# Patient Record
Sex: Male | Born: 1935 | Race: White | Hispanic: No | Marital: Married | Smoking: Never smoker
Health system: Southern US, Community
[De-identification: ages and names within clinical notes are randomized; demographics above are authoritative.]

## PROBLEM LIST (undated history)

## (undated) DIAGNOSIS — R569 Unspecified convulsions: Secondary | ICD-10-CM

## (undated) DIAGNOSIS — C4491 Basal cell carcinoma of skin, unspecified: Secondary | ICD-10-CM

## (undated) DIAGNOSIS — D649 Anemia, unspecified: Secondary | ICD-10-CM

## (undated) DIAGNOSIS — G4733 Obstructive sleep apnea (adult) (pediatric): Secondary | ICD-10-CM

## (undated) DIAGNOSIS — Z923 Personal history of irradiation: Secondary | ICD-10-CM

## (undated) DIAGNOSIS — K219 Gastro-esophageal reflux disease without esophagitis: Secondary | ICD-10-CM

## (undated) DIAGNOSIS — C61 Malignant neoplasm of prostate: Secondary | ICD-10-CM

## (undated) DIAGNOSIS — D496 Neoplasm of unspecified behavior of brain: Secondary | ICD-10-CM

## (undated) DIAGNOSIS — R351 Nocturia: Secondary | ICD-10-CM

## (undated) DIAGNOSIS — R195 Other fecal abnormalities: Secondary | ICD-10-CM

## (undated) DIAGNOSIS — K449 Diaphragmatic hernia without obstruction or gangrene: Secondary | ICD-10-CM

## (undated) DIAGNOSIS — E785 Hyperlipidemia, unspecified: Secondary | ICD-10-CM

## (undated) DIAGNOSIS — I10 Essential (primary) hypertension: Secondary | ICD-10-CM

## (undated) DIAGNOSIS — G2581 Restless legs syndrome: Secondary | ICD-10-CM

## (undated) DIAGNOSIS — I351 Nonrheumatic aortic (valve) insufficiency: Secondary | ICD-10-CM

## (undated) DIAGNOSIS — D329 Benign neoplasm of meninges, unspecified: Secondary | ICD-10-CM

## (undated) DIAGNOSIS — IMO0002 Reserved for concepts with insufficient information to code with codable children: Secondary | ICD-10-CM

## (undated) HISTORY — PX: OTHER SURGICAL HISTORY: SHX169

## (undated) HISTORY — PX: TONSILLECTOMY: SUR1361

## (undated) HISTORY — PX: COLONOSCOPY: SHX174

## (undated) HISTORY — PX: PROSTATE BIOPSY: SHX241

## (undated) HISTORY — DX: Nonrheumatic aortic (valve) insufficiency: I35.1

## (undated) HISTORY — DX: Obstructive sleep apnea (adult) (pediatric): G47.33

## (undated) HISTORY — PX: VASECTOMY: SHX75

## (undated) HISTORY — PX: JOINT REPLACEMENT: SHX530

---

## 2001-04-04 ENCOUNTER — Ambulatory Visit (HOSPITAL_COMMUNITY): Admission: RE | Admit: 2001-04-04 | Discharge: 2001-04-04 | Payer: Self-pay | Admitting: Gastroenterology

## 2005-03-25 ENCOUNTER — Observation Stay (HOSPITAL_COMMUNITY): Admission: EM | Admit: 2005-03-25 | Discharge: 2005-03-26 | Payer: Self-pay | Admitting: Emergency Medicine

## 2005-03-31 ENCOUNTER — Ambulatory Visit (HOSPITAL_COMMUNITY): Admission: RE | Admit: 2005-03-31 | Discharge: 2005-03-31 | Payer: Self-pay | Admitting: Gastroenterology

## 2008-04-11 ENCOUNTER — Observation Stay (HOSPITAL_COMMUNITY): Admission: EM | Admit: 2008-04-11 | Discharge: 2008-04-12 | Payer: Self-pay | Admitting: Emergency Medicine

## 2008-04-21 ENCOUNTER — Encounter: Admission: RE | Admit: 2008-04-21 | Discharge: 2008-04-21 | Payer: Self-pay | Admitting: Gastroenterology

## 2008-09-30 ENCOUNTER — Inpatient Hospital Stay (HOSPITAL_COMMUNITY): Admission: RE | Admit: 2008-09-30 | Discharge: 2008-10-03 | Payer: Self-pay | Admitting: Orthopedic Surgery

## 2008-10-28 ENCOUNTER — Encounter: Admission: RE | Admit: 2008-10-28 | Discharge: 2009-01-26 | Payer: Self-pay | Admitting: Orthopedic Surgery

## 2009-01-19 ENCOUNTER — Ambulatory Visit (HOSPITAL_COMMUNITY): Admission: RE | Admit: 2009-01-19 | Discharge: 2009-01-19 | Payer: Self-pay | Admitting: Urology

## 2009-01-21 ENCOUNTER — Ambulatory Visit: Admission: RE | Admit: 2009-01-21 | Discharge: 2009-04-21 | Payer: Self-pay | Admitting: Radiation Oncology

## 2009-04-06 ENCOUNTER — Encounter: Admission: RE | Admit: 2009-04-06 | Discharge: 2009-04-06 | Payer: Self-pay | Admitting: Family Medicine

## 2009-04-21 ENCOUNTER — Ambulatory Visit: Admission: RE | Admit: 2009-04-21 | Discharge: 2009-05-10 | Payer: Self-pay | Admitting: Radiation Oncology

## 2011-01-03 LAB — BASIC METABOLIC PANEL
BUN: 19 mg/dL (ref 6–23)
BUN: 22 mg/dL (ref 6–23)
CO2: 26 mEq/L (ref 19–32)
Calcium: 7.9 mg/dL — ABNORMAL LOW (ref 8.4–10.5)
Calcium: 8.1 mg/dL — ABNORMAL LOW (ref 8.4–10.5)
Chloride: 101 mEq/L (ref 96–112)
Chloride: 102 mEq/L (ref 96–112)
Creatinine, Ser: 1.04 mg/dL (ref 0.4–1.5)
Creatinine, Ser: 1.1 mg/dL (ref 0.4–1.5)
GFR calc Af Amer: >60 mL/min (ref >60)
GFR calc Af Amer: >60 mL/min (ref >60)
GFR calc non Af Amer: >60 mL/min (ref >60)
Glucose, Bld: 82 mg/dL (ref 70–99)
Potassium: 4 mEq/L (ref 3.5–5.1)
Sodium: 134 mEq/L — ABNORMAL LOW (ref 135–145)

## 2011-01-03 LAB — CBC
HCT: 45.8 % (ref 39.0–52.0)
Hemoglobin: 10.9 g/dL — ABNORMAL LOW (ref 13.0–17.0)
Hemoglobin: 15.2 g/dL (ref 13.0–17.0)
MCHC: 33.2 g/dL (ref 30.0–36.0)
MCV: 97.6 fL (ref 78.0–100.0)
MCV: 98.3 fL (ref 78.0–100.0)
Platelets: 182 10*3/uL (ref 150–400)
Platelets: ADEQUATE 10*3/uL (ref 150–400)
RBC: 3.34 MIL/uL — ABNORMAL LOW (ref 4.22–5.81)
RBC: 3.54 MIL/uL — ABNORMAL LOW (ref 4.22–5.81)
WBC: 10.4 10*3/uL (ref 4.0–10.5)
WBC: 13.6 10*3/uL — ABNORMAL HIGH (ref 4.0–10.5)
WBC: 8 10*3/uL (ref 4.0–10.5)

## 2011-01-03 LAB — URINALYSIS, ROUTINE W REFLEX MICROSCOPIC
Glucose, UA: NEGATIVE mg/dL
Hgb urine dipstick: NEGATIVE
Protein, ur: NEGATIVE mg/dL
Specific Gravity, Urine: 1.017 (ref 1.005–1.030)
pH: 7 (ref 5.0–8.0)

## 2011-01-03 LAB — DIFFERENTIAL
Eosinophils Relative: 2 % (ref 0–5)
Lymphs Abs: 1.6 10*3/uL (ref 0.7–4.0)
Monocytes Absolute: 0.9 10*3/uL (ref 0.1–1.0)
Neutro Abs: 5.3 10*3/uL (ref 1.7–7.7)

## 2011-01-03 LAB — TYPE AND SCREEN
ABO/RH(D): O POS
Antibody Screen: NEGATIVE

## 2011-01-03 LAB — PROTIME-INR: Prothrombin Time: 12.7 seconds (ref 11.6–15.2)

## 2011-01-03 LAB — ABO/RH: ABO/RH(D): O POS

## 2011-02-01 NOTE — Consult Note (Signed)
NAME:  [name], [name] NO.:  [id number]   MEDICAL RECORD NO.:  [id number]          PATIENT TYPE:  INP   LOCATION:  5531                         FACILITY:  [facility]   PHYSICIAN:  Graylin Shiver, M.D.   DATE OF BIRTH:  04-Feb-1931   DATE OF CONSULTATION:  04/11/2008  DATE OF DISCHARGE:                                 CONSULTATION   REASON FOR CONSULTATION:  The patient is a 77 year old white male who  was admitted to the hospital yesterday because of anemia.  He presented  to his primary care physician with complaints of worsening restless leg  syndrome and some blood work was done which showed that he was anemic.  He was admitted to the hospital with hemoglobin of 8.3 and hematocrit of  27.1.  He was transfused 2 units of blood and today his hemoglobin and  hematocrit are 9.7 and 30 respectively.  The patient has not noticed any  evidence of gastrointestinal bleeding.  He denies hematemesis, melena,  or hematochezia.  He has no abdominal pain.   In 2006, he had a similar episode of anemia reported as heme positive.  He had a colonoscopy which was normal and an EGD which showed a hiatal  hernia and duodenitis.   He had an EGD and a colonoscopy also back in 2002, done by Dr. Ewing Schlein.  It was reported that he had some external and internal hemorrhoids and  some left-sided diverticula.   ALLERGIES:  None known.   MEDICAL HISTORY:  1. Hyperlipidemia.  2. Restless leg syndrome.  3. States he had a peptic ulcer when he was in his 76s.  4. Hyperlipidemia.  5. Actinic keratosis.  6. Diverticulosis.  7. Arthralgias.  8. BPH.   MEDICATIONS PRIOR TO ADMISSION:  Diovan/HCT, Zocor, Prilosec, ReQuip,  and multivitamin.   SOCIAL HISTORY:  Does not smoke or drink alcohol.   Systems reviewed, not complaining of chest pain or shortness of breath.   PHYSICAL EXAMINATION:  VITAL SIGNS:  Stable.  GENERAL:  He is in no distress, nonicteric.  HEART:  Regular rhythm.  No  murmurs.  LUNGS:  Clear.  ABDOMEN:  Soft, nontender, no hepatosplenomegaly.   Serum iron percent saturation is 2%.   IMPRESSION:  Iron deficiency anemia.   COMMENTS:  This patient has not exhibited any signs clinically of any  bleeding.  He is currently stable.  I think that he can be discharged to  home and follow up with Dr. Ewing Schlein as an outpatient for consideration of  repeat outpatient EGD, colonoscopy, and capsule endoscopy.           ______________________________  Graylin Shiver, M.D.     SFG/MEDQ  D:  04/11/2008  T:  04/12/2008  Job:  [id number]   cc:   Petra Kuba, M.D.  [name], M.D.

## 2011-02-01 NOTE — Discharge Summary (Signed)
[name], [name] NO.:  [id number]   MEDICAL RECORD NO.:  [id number]          PATIENT TYPE:  INP   LOCATION:  1616                         FACILITY:  [facility]   PHYSICIAN:  Madlyn Frankel. Charlann Boxer, M.D.  DATE OF BIRTH:  04-Feb-1931   DATE OF ADMISSION:  09/30/2008  DATE OF DISCHARGE:  10/03/2008                               DISCHARGE SUMMARY   ADMITTING DIAGNOSES:  1. Osteoarthritis.  2. Hypercholesterolemia.  3. Hiatal hernia.  4. Anemia.  5. Skin cancer.   DISCHARGE DIAGNOSES:  1. Osteoarthritis.  2. Hypercholesterolemia.  3. Hiatal hernia.  4. Anemia.  5. Skin cancer.   HISTORY OF PRESENT ILLNESS:  A 77 year old male with a history of right  knee pain secondary to osteoarthritis.   CONSULTATIONS:  None.   PROCEDURE:  A right total knee replacement by surgeon Dr. Durene Romans.  Assistant Dwyane Luo, PA-C.   LABORATORY DATA:  CBC final reading - white blood cells 13.6, hemoglobin  10.9, hematocrit 32.8, platelets 154.  White cell differential all  within normal limits.  Coags normal.  Metabolic panel final reading -  sodium 134, potassium 4, glucose 124, BUN 21, creatinine 1.1.  Calcium  was 7.9.  Urinalysis was negative.   ELECTROCARDIOGRAM:  Normal sinus rhythm.   RADIOLOGY:  No chest x-ray found.   HOSPITAL COURSE:  The patient underwent a right total knee replacement  and was admitted to the orthopedic floor.  His stay was unremarkable.  He remained hemodynamically stable throughout his course of stay.  He  made good progress with physical therapy, able to ambulate at least 75  feet prior to discharge with the use of a rolling walker and improving  quad function.  His dressing was changed.  No significant drainage from  the wound.  A dry dressing was applied and is to be changed on a daily  basis.  Seen on October 03, 2008, was stable and ready for discharge  home.   DISCHARGE DISPOSITION:  Discharged home in stable and improved  condition.   Weightbearing as tolerated.   DISCHARGE PHYSICAL THERAPY:  Weightbearing as tolerated with the use of  a rolling walker.  Goals for range of motion of 0-120 degrees at 2  weeks.   DISCHARGE DIET:  Heart healthy.   DISCHARGE WOUND CARE:  Keep dry.   DISCHARGE MEDICATIONS:  1. Lovenox 40 mg subcutaneous q.24h. x10 days.  2. Then start enteric-coated aspirin one p.o. daily x4 weeks.  3. Robaxin 500 mg p.o. q.6h.  4. Colace 100 mg p.o. b.i.d.  5. MiraLax 17 gm p.o. daily.  6. Celebrex 200 mg p.o. b.i.d. x2 weeks.  7. Oxycodone 5 mg 1-3 p.o. q.3-4h. p.r.n. pain.  8. Tylenol 1000 mg q.6h.; do not exceed 4000 mg per day.  9. Simvastatin 40 mg one p.o. q.p.m.  10.Diovan HCTZ 160/12.5 one p.o. q.a.m.  11.Ropinirole 10 mg one p.o. q.p.m.  12.Prilosec one p.o. q.p.m.  13.Iron 325 mg p.o. daily.  14.Multivitamin daily.   DISCHARGE FOLLOW UP:  Follow up with Dr. Charlann Boxer at phone number [phone]  in  2 weeks for a wound check.     ______________________________  Yetta Glassman. [address], [place]      Madlyn Frankel. Charlann Boxer, M.D.  Electronically Signed    BLM/MEDQ  D:  10/28/2008  T:  10/28/2008  Job:  [id number]   cc:   [name], M.D.  Fax: [phone]   Petra Kuba, M.D.  Fax: [phone]   Hope M. Danella Deis, M.D.  Fax: [phone]   Chucky May, M.D.  Fax: [phone]

## 2011-02-01 NOTE — H&P (Signed)
[name], BABE NO.:  [id number]   MEDICAL RECORD NO.:  [id number]          PATIENT TYPE:  INP   LOCATION:  NA                           FACILITY:  [facility]   PHYSICIAN:  Madlyn Frankel. Charlann Boxer, M.D.  DATE OF BIRTH:  1931/04/24   DATE OF ADMISSION:  09/30/2008  DATE OF DISCHARGE:                              HISTORY & PHYSICAL   PROCEDURE:  Right total knee replacement protocol.   CHIEF COMPLAINT:  Right knee pain.   HISTORY OF PRESENT ILLNESS:  A 77 year old male with a history of right  knee pain secondary to osteoarthritis.  It has been refractory to all  conservative treatment.   PAST MEDICAL HISTORY:  1. Osteoarthritis.  2. Hypercholesterolemia.  3. Hiatal hernia.  4. Anemia.  5. Skin cancer.   PAST SURGICAL HISTORY:  None.   FAMILY HISTORY:  Alzheimer's, congestive heart failure.   SOCIAL HISTORY:  Married.  Nonsmoker.  He does have a primary caregiver  postoperatively in the home.   DRUG ALLERGIES:  No known drug allergies.   MEDICATIONS:  1. Simvastatin 40 mg 1 p.o. daily.  2. Diovan/HCTZ 160/unknown 1 p.o. daily.  3. Requip 2 mg p.o. daily.  4. Prilosec 40 mg 1 p.o. daily.  5. Multivitamin p.o. daily.  6. Iron 325 mg 1 p.o. b.i.d.  7. Celebrex 200 mg 1 p.o. b.i.d. x2 weeks after surgery.   REVIEW OF SYSTEMS:  GENERAL:  He has chills.  HEENT:  He has ringing in  his ears.  GENITOURINARY:  He has increased urination at night.  MUSCULOSKELETAL:  Joint pain.  Otherwise see HPI.   PHYSICAL EXAMINATION:  Pulse 72, respirations 16, blood pressure 128/78.  GENERAL:  Awake, alert and oriented.  Well-developed and well-nourished  in no acute distress.  HEENT:  Normocephalic.  NECK:  Supple.  No carotid bruits.  CHEST:  Lungs are clear to auscultation bilaterally.  BREASTS:  Deferred.  HEART:  Regular rate and rhythm.  S1 and S2 distinct.  ABDOMEN:  Soft, nontender, nondistended.  Bowel sounds present.  PELVIS:  Stable.  GENITOURINARY:   Deferred.  EXTREMITIES:  Right knee pain.  SKIN:  No cellulitis.  NEUROLOGIC:  Intact distal sensibilities.   LABORATORY DATA:  Labs, EKG, chest x-ray all pending pre-surgical  testing.   IMPRESSION:  Right knee osteoarthritis.   PLAN OF ACTION:  Right total knee replacement protocol at [facility] on September 30, 2008 by Dr. Lajoyce Corners.  Risks and complications  were discussed.   Postoperative medications will be provided, including aspirin, Robaxin,  Celebrex, Ambien, Colace, MiraLax.     ______________________________  Yetta Glassman. [address], [place]      Madlyn Frankel. Charlann Boxer, M.D.  Electronically Signed    BLM/MEDQ  D:  09/22/2008  T:  09/22/2008  Job:  [id number]   cc:   [name], M.D.  Fax: [phone]   Petra Kuba, M.D.  Fax: [phone]   Hope M. Danella Deis, M.D.  Fax: [phone]   Chucky May, M.D.  Fax: [phone]

## 2011-02-01 NOTE — H&P (Signed)
[name], BURMESTER NO.:  [id number]   MEDICAL RECORD NO.:  [id number]          PATIENT TYPE:  INP   LOCATION:  5531                         FACILITY:  [facility]   PHYSICIAN:  Ramiro Harvest, MD    DATE OF BIRTH:  02/21/1931   DATE OF ADMISSION:  04/10/2008  DATE OF DISCHARGE:                              HISTORY & PHYSICAL   ATTENDING PHYSICIAN:  Ramiro Harvest, M.D.   PRIMARY CARE PHYSICIAN:  The patient's primary care physician is [name], M.D. of [facility].   GASTROENTEROLOGIST:  The patient's gastroenterologist is Petra Kuba,  M.D. of [facility].   HISTORY OF THE PRESENT ILLNESS:  The patient is a 77 year old white male  with a history of hypertension, anemia, gastritis, hiatal hernia,  internal and external hemorrhoids, and diverticulosis who presents from  his PCP's office with anemia.  The patient states he went to see his PCP  concerning worsening restless leg syndrome; and, his PCP did some blood  work and sent the patient to the ED for further evaluation and  recommendations.   The patient denies any malaise, has no fatigue, no weakness, no chest  pain, no shortness of breath, no hematemesis, no melena, no  hematochezia, no abdominal pain, no use of NSAIDs no nausea, no  vomiting, no fever, no cough, no headaches, no dizziness, no syncope, no  focal neurological symptoms, no diarrhea, no constipation, and no other  associated symptoms or urinary symptoms.  The patient is not on any  NSAIDS and uses Tylenol for his pain.  The patient states that he had  prior episodes in the past.  The patient was in the ED and a repeat CBC  showed a white count of 7.9, hemoglobin 8.3 and platelets 337,000.  UA  was planned.  The  EKG showed normal sinus rhythm.  I-STAT was 8.  Sodium 139, potassium  3.6, chloride 104, BUN 25, and creatinine 1.01.   ALLERGIES:  No known drug allergies.   PAST MEDICAL HISTORY:  1. Hyperlipidemia.  2.  Restless leg syndrome.  3. Anemia.  4. Peptic ulcer disease diagnosed when the patient was in his 39s.  5. Hyperlipidemia.  6. Actinic keratosis.  7. Hiatal hernia per EGD of April 04, 2006.  8. History of gastritis.  9. Internal and external hemorrhoids diagnosed on April 04, 2001 on      endoscopy.  10.Diverticular on the left side diagnosed on April 04, 2001 per      colonoscopy per Dr. Ewing Schlein.  11.Duodenitis/antral gastritis diagnosed in July 2006.  12.Gastroesophageal reflux disease.  13.Erectile dysfunction.  14.Basal cell cancer; followed up by Dr. Danella Deis.  15.Arthralgias.  16.BPH.   MEDICATIONS:  The patient's home medications are:  1. Diovan HCT 160/12.5 mg a day.  2. Zocor 40 mg daily.  3. Prilosec 20 mg daily.  4. Requip 2 mg at bedtime.  5. Multivitamin daily.   SOCIAL HISTORY:  No tobacco.  No alcohol.  No IV drug use.  The patient  is married and lives with his wife in [place], and  has two sons that  are healthy.   FAMILY HISTORY:  Mother is alive at age 6 with Parkinson's disease and  rheumatoid arthritis.  She is bedridden at the [facility].  Father is  deceased at age 5 from Alzheimer's disease and also coronary artery  disease.  A grandfather with a history of colon cancer.  A brother is  alive at age 42 with coronary artery disease.   REVIEW OF SYSTEMS:  The review of systems is as per the HPI.  It is also  positive for chills, which are more chronic in nature per the patient.   PHYSICAL EXAMINATION:  VITAL SIGNS:  On physical exam temperature 100.0,  blood pressure 126/66, pulse 83, respiratory rate 18, and satting 90% on  room air.  GENERAL APPEARANCE:  In general the patient is in no apparent distress.  HEENT:  The head is normocephalic and atraumatic.  Pupils are equal,  round and react to light.  Extraocular movements are intact.  Oropharynx  is clear with no lesions and no exudates.  NECK:  The neck is supple.  No lymphadenopathy.  CHEST AND  LUNGS:  Respiratory shows the lungs are clear to auscultation  bilaterally in the anterior lung fields.  HEART:  Cardiovascular reveals regular rate and rhythm.  No murmurs,  rubs or gallops.  ABDOMEN:  The abdomen is soft, nontender and nondistended with positive  bowel sounds.  EXTREMITIES:  The extremities reveal no clubbing, cyanosis or edema.  NEUROLOGICAL:  The patient is alert and oriented x3.  Cranial nerves II-  XII are grossly intact.  No focal deficits.  Gait is not tested.   LABORATORY DATA:  I-STAT; sodium 139, potassium 3.6, chloride 104, BUN  25, creatinine 1.1, and glucose 114. EKG showed normal sinus rhythm.  CBC; white count 7.5, hemoglobin 8.3, platelets to 337,000, and  hematocrit 27.1.  UA was bland .  The patient had a hemoglobin of 7.9 in  his PCP's office.   ASSESSMENT AND PLAN:  The patient is a 77 year old gentleman with a  history of diverticulitis, gastritis, hiatal hernia and duodenitis as  well as a past history of anemia and peptic ulcer disease when he was in  his 4s who presents to the emergency department with anemia from his  primary care physician's office; and a repeat hemoglobin of 8.3.   Problem 1  Anemia of questionable etiology, likely lower  gastrointestinal bleed versus upper gastrointestinal bleed.  The patient  also has a history of hemorrhoids, gastritis, duodenitis, and peptic  ulcer disease in his 63s. We will place 2 large bore intravenous lines,  give him a clear liquid diet and intravenous fluids.  We will check a  CMET, check an anemia panel, check coagulations, check a complete blood  count every 8 hours x3 and guaiac stools.  We will check a chest x-ray.  We will transfuse 2 units of packed red blood cells with 20 of Lasix in  between.  We will give him Protonix 80 mg IV every 12 hours.  We will  consult GI in the morning.  Problem  2  Restless leg syndrome.  We will continue his home dose of  Requip.  Problem  3   Hyperlipidemia.  Zocor.  Problem  4  Hypertension.  We will hold his blood pressure medications.  Problem  5  Actinic keratosis.  Problem  6  Hiatal hernia.  Problem  7  Gastroesophageal reflux disease.  We will give Protonix.  Problem  8  History of basal cell cancer.  Problem  9  Benign prostatic hypertrophy.  Problem  10  Arthralgias.  Problem  11  Prophylaxes.  Protonix for gastrointestinal prophylaxis.  Sequential compressive devices for deep venous thrombosis prophylaxis.   It has been a pleasure taking care of this patient.      Ramiro Harvest, MD  Electronically Signed     DT/MEDQ  D:  04/11/2008  T:  04/11/2008  Job:  [id number]   cc:   [name], M.D.  Fax: [phone]   Petra Kuba, M.D.  Fax: [phone]

## 2011-02-01 NOTE — Op Note (Signed)
[name], [name] NO.:  [id number]   MEDICAL RECORD NO.:  [id number]          PATIENT TYPE:  INP   LOCATION:  0005                         FACILITY:  [facility]   PHYSICIAN:  Madlyn Frankel. Charlann Boxer, M.D.  DATE OF BIRTH:  [birth year]   DATE OF PROCEDURE:  09/30/2008  DATE OF DISCHARGE:                               OPERATIVE REPORT   PREOPERATIVE DIAGNOSIS:  Right knee osteoarthritis.   POSTOPERATIVE DIAGNOSIS:  Right knee osteoarthritis.   PROCEDURE:  Right total knee replacement.   COMPONENTS:  DePuy rotating platform posterior stabilized knee system,  size 4 narrow femur, 4 tibia, 10-mm insert, and 38 patella button.   ASSISTANT:  Yetta Glassman. Marcellus Scott.   ANESTHESIA:  Duramorph spinal.   SPECIMEN:  None.   FINDINGS:  None.   COMPLICATIONS:  None.   DRAINS:  One medium Hemovac.   TOURNIQUET TIME:  40 minutes at 250 mmHg.   INDICATIONS FOR PROCEDURE:  Mr. Marcil is a 77 year old gentleman who I  have been following for bilateral knee pain.  He had radiographic  evidence of degenerative changes.  He had failed conservative measures  and at this point wished to proceed with more definitive treatment in  terms of knee replacement.  I reviewed the risks and benefits of this  procedure including infection, DVT, component failure, and need for  revision, including manipulation.   Consent was obtained for the benefit of pain relief.   PROCEDURE IN DETAIL:  The patient was brought to the operative theater.  Once adequate anesthesia and preoperative antibiotics, Ancef,  administered, the patient was positioned supine.  A thigh tourniquet was  placed on the right thigh.  A time-out was performed, identifying the  patient and extremity.  The right lower extremity was then pre-scrubbed,  prepped, and draped in sterile fashion.  The leg was exsanguinated and  tourniquet elevated to 250 mmHg.  A midline incision was made followed  by a median arthrotomy.  Following  initial debridement, attention was  directed to the patella.  Precut measurement was 30 mm.  I resected down  to 14 mm and used a 38 patellar button which covered  this cut surface  great.   I placed a metal shim on the cut surface to protect it from retractors.   The attention was now directed to the femur.   The femoral canal was opened with the drill and irrigated to prevent fat  emboli.  I placed an intramedullary rod and at 3 degrees of valgus, I  resected 10 mm of bone off the distal femur.   The tibia was then subluxated anteriorly.  I used an extramedullary  guide and resected 10 mm of bone off the proximal lateral tibia.  I  checked this cut to make sure it was perpendicular with the tray and  alignment rod, and once it was confirmed that this was perpendicular, I  returned back to the femur.   Femoral rotation was based off the proximal cut of the tibia.  I sized  the femur to be a size 4, and an  anterior-to-posterior dimension that I  felt was going to be best with the narrow base on the medial-to-lateral  dimension.  I then used the size 4 rotation block based off the cut  surface of the proximal tibia.  It was pinned into place.  We exchanged  this out then for the 4-in-1 cutting block.  The anterior, posterior,  and chamfer cuts were then made without difficulty or notching.   At this point, the final box cut was made on the lateral aspect of  distal femur.   The tibia was then subluxated anteriorly.  I chose to use a size 4 which  fit best on the cut surface of the tibia.  It was pinned to the medial  third of the tubercle.  It was then drilled and keel punched and now a  trial reduction carried out with 4 narrow femur, 4 tibia, and 10-mm  insert.  The knee came to full extension with stable ligaments from  extension to flexion.  The patella tracked through the trochlea without  application of pressure.  Given this, all trial components were removed.  We injected  the synovial capsule junction with 60 mL of 0.25% Marcaine  with epinephrine.  I then irrigated the knee with normal saline solution  pulse lavage.   The final components were cemented into place.  The knee was brought to  extension with a 10-mm insert.  The extruded cement was removed.   Once the cement had cured, excessive cement was removed throughout the  knee.   Once we were satisfied there was no remaining cement, the final 10-mm  insert to match the 4 femur was inserted.   A medium Hemovac drain was placed in the deep subcapsular tissues.  We  irrigated the knee again with pulse lavage.   The knee was then brought to flexion.  The extensor mechanism was  reapproximated using 1-Vicryl.  The remaining wound was closed in layers  with 2-0 Vicryl and running 4-0 Monocryl.  The knee was cleaned, dried,  and dressed sterilely with Steri-Strips and a bulky sterile wrap.  He  was then brought to the recovery room in stable condition.      Madlyn Frankel Charlann Boxer, M.D.  Electronically Signed     MDO/MEDQ  D:  09/30/2008  T:  09/30/2008  Job:  [id number]

## 2011-02-04 NOTE — Procedures (Signed)
[facility]  Patient:    [name], [name]                         MRN: [id number] Proc. Date: 04/04/01 Adm. Date:  [id number] Attending:  Nelda Marseille CC:         Dellis Anes. Idell Pickles, M.D.   Procedure Report  PROCEDURE:  Esophagogastroduodenoscopy with biopsy.  SURGEON:  Petra Kuba, M.D.  INDICATIONS:  Patient with guaiac positivity and nondiagnostic colonoscopy. Consent was signed after risks, benefits, methods, and options were thoroughly discussed in the office.  ADDITIONAL MEDICINES FOR THIS PROCEDURE:  Demerol 25 and Versed 2.5.  DESCRIPTION OF PROCEDURE:  The video endoscope was inserted by direct vision. The esophagus was tortuous, but normal.  In the distal esophagus, there was a large hiatal hernia.  The scope passed easily through this into the stomach and pertinent for some gastritis and antritis through a normal pylorus into the duodenal bulb, pertinent for a moderate amount of bulbitis, but no active ulcers around to the cilia to a normal second portion of the duodenum.  The scope was withdrawn back to the bulb and a good look there confirmed a mild to moderate inflammation.  The scope was withdrawn back to the stomach and retroflexed.  High in the cardia, the hiatal hernia was confirmed.  The stomach was evaluated on retroflex and straight visualization.  The angularis, cardia, and fundus, lesser and greater curve were pertinent for some distal gastritis as well as the antritis, but no additional findings were seen.  We advanced to the antrum and one biopsy for the CLOtest was obtained to rule out Helicobacter.  Straight visualization of the stomach did not reveal any additional findings.  Air was suctioned and the scope slowly withdrawn. Again, a good look at the esophagus was normal on slow withdrawal.  The scope was removed.  The patient tolerated the procedure well.  There were no obvious or immediate  complications.  ENDOSCOPIC DIAGNOSES: 1. Large hiatal hernia. 2. Moderate gastritis, antritis, and bulbitis, status post Campylobacter-like    organism biopsy of the antrum to rule out Helicobacter. 3. Otherwise normal esophagogastroduodenoscopy.  PLAN:  Await CLO biopsy, probably treat at some point in the future care with aspirin and nonsteroidals.  Followup p.r.n. or in 6-8 weeks to recheck symptoms and probably guaiacs.  Also of aspirin and nonsteroidals possibly while treating with a pump inhibitor, possibly then recheck CBC to make sure no further workup plans are needed. DD:  04/04/01 TD:  04/05/01 Job: [id number]

## 2011-02-04 NOTE — H&P (Signed)
NAMEHADDON, FYFE NO.:  [id number]   MEDICAL RECORD NO.:  [id number]          PATIENT TYPE:  EMS   LOCATION:  ED                           FACILITY:  [facility]   PHYSICIAN:  Melissa L. Ladona Ridgel, MD  DATE OF BIRTH:  1930/10/08   DATE OF ADMISSION:  03/25/2005  DATE OF DISCHARGE:                                HISTORY & PHYSICAL   CHIEF COMPLAINT:  Weakness with the finding of severe anemia on outpatient  labs.   PRIMARY CARE PHYSICIAN:  Dr. Janey Greaser.  Patient was sent to the emergency  room by Dr. Hyacinth Meeker.   HISTORY OF PRESENT ILLNESS:  Patient is a 77 year old white male with last  visit to Dr. Janey Greaser over approximately one year ago.  Patient states that  over the last 2-3 weeks, he has noticed a gradual onset of shortness of  breath with exertional dyspnea and severe fatigue.  He gives example, for  instance, he used to walk four miles at least twice a week.  He attempts to  go out and do that and can only walk for a few minutes.  His wife noted in  the last couple of days that he would take a chair out to do some yard work  and have to sit down after a couple of minutes.  He denies any PND or  orthopnea.  He states that he has had no melena or hematochezia.  He has had  no nausea or vomiting.  He relates a new onset of restless legs syndrome,  and he was started on some medication this week for it.   REVIEW OF SYSTEMS:  Restless legs.  No melena or hematochezia.  On and off  constipation.  Positive shortness of breath.  Positive fatigue.  No PND or  orthopnea.  No weight loss or gain.  He has a good appetite.  No dysuria.  All other review of systems are negative.   PAST MEDICAL HISTORY:  1.  Hypertension.  2.  Peptic ulcer disease diagnosed in his 65s but has had no massive      bleeding from that.  3.  High cholesterol, for which he has been treated.  4.  Actinic keratosis.  5.  A number of years ago, he was noted to have blood in his fecal occult  blood testing; therefore, he underwent a colonoscopy and an EGD, at      which time he was found to have a hiatal hernia with gastritis and a few      diverticula.  Also some hemorrhoids.  His H. pylori was negative.  That      endoscopy was in July, 2002.   PAST SURGICAL HISTORY:  Negative.   ALLERGIES:  No known drug allergies, although he does relate that when he  used 5-FLUOROURACIL for his actinic keratosis, his faces and eyes swelled  up.   CURRENT MEDICATIONS:  1.  Diovan 160/12.5 mg daily.  2.  Zocor 40 mg daily.  3.  Aspirin 81 mg daily.   SOCIAL HISTORY:  He denies tobacco, ethanol.  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   FAMILY HISTORY:  Mom is living at 61 with Parkinson's disease and  rheumatoid.  Dad is deceased with Alzheimer's disease, coronary artery  disease.  He had a grandfather who had colon cancer.   PHYSICAL EXAMINATION:  VITAL SIGNS:  Temperature 98.1, blood pressure  125/69, pulse 83, respirations 18, saturation 99%.  GENERAL:  This is a well-developed and well-nourished white male in no acute  distress.  HEENT:  Pupils are equal, round and reactive to light.  Extraocular muscles  are intact.  Normocephalic and atraumatic.  He does have mild pallor.  Mucous membranes are moist.  NECK:  Supple.  There is no JVD, lymph nodes, or carotid bruits.  He has no  thyromegaly.  LUNGS:  Clear to auscultation.  There are no rales, wheezes, or rhonchi.  CARDIOVASCULAR:  Regular rate and rhythm.  Positive S1 and S2.  No S3 or S4.  He does have a 1-2/6 systolic ejection murmur at the left sternal border.  ABDOMEN:  Soft, nontender, nondistended with positive bowel sounds.  RECTAL:  No enlargement of the prostate.  It feels soft, nontender.  There  is firm stool in the vault.  Guaiac is heme-positive.  No frank hematochezia  is noted.  EXTREMITIES:  No clubbing,  cyanosis or edema.  NEUROLOGIC:  He is intact.  Cranial nerves II-XII are intact.  Power is 5/5.  DTRs are 2+.   LABORATORY:  White count 8, hemoglobin 7.7, hematocrit 24.6, platelets 425,  MCV 72.9.  Sodium 134, potassium 3.6, chloride 106, CO2 26, BUN 26,  creatinine 1.2, glucose 97.  PT/INR 13.3 and 1, respectively.  PTT is 29.  Total CK is 116.  MB is 1.5.  Troponin is 0.01.   EKG:  Normal sinus rhythm at 74 beats per minute with no active T wave  changes.   ASSESSMENT/PLAN:  A 77 year old white male with presentation to his primary  care physician for increasing fatigue and shortness of breath over the last  several weeks.  The primary care physician obtained laboratories in an  outpatient setting and found that his hemoglobin was down to 8.3.  He  presents to the emergency room for further workup.  Past medical history is  for peptic ulcer disease without any significant bleeding, but identifiable  hiatal hernia with gastritis noted on EGD in 2002.  Problem 1:  Gastrointestinal microcytic anemia, likely chronic versus acute-  on-chronic blood loss:  We will undertake an anemia workup, transfusing 2  units of packed red cells.  Check all stools for blood.  Consider a GI  consultation for either inpatient versus outpatient EGD colonoscopy.  Will  start him on Protonix IV b.i.d. x2 doses now and change him to p.o.  Problem 2:  No complaints or issues, although he does have a history of  erectile dysfunction with the use of Viagra.  The last dosing is unknown.  The patient did not discuss this with me.  I located this in the outpatient  notes.  I will review this when we have a private moment.  Problem 3:  Cardiovascular:  He has hypertension.  On Diovan and  hydrochlorothiazide.  We will continue him if his blood pressure remains  stable, otherwise we will hold this.  Ultimately, I would obtain an echo for this patient.  Cardiac enzymes are negative  for obvious strain.  Problem 4:   Pulmonary:  Clinical signs and symptoms of disease.  We will  check a chest x-ray, however, because  there were symptoms of shortness of breath.  This, however, was likely  secondary to anemia.  Problem 5:  Endocrine:  We will check a TSH and a fasting lipid panel.  Problem 6:  DVT prophylaxis:  Will use SCD's and ambulation.       MLT/MEDQ  D:  03/25/2005  T:  03/25/2005  Job:  [id number]   cc:   Al Decant. Janey Greaser, MD  [address]  [place]  [place] [address]  Fax: [phone]

## 2011-02-04 NOTE — Op Note (Signed)
[name], [name]                  ACCOUNT NO.:  [id number]   MEDICAL RECORD NO.:  [id number]          PATIENT TYPE:  AMB   LOCATION:  ENDO                         FACILITY:  [facility]   PHYSICIAN:  John C. Madilyn Fireman, M.D.    DATE OF BIRTH:  04/08/1931   DATE OF PROCEDURE:  DATE OF DISCHARGE:                                 OPERATIVE REPORT   PROCEDURE:  Colonoscopy.   INDICATIONS FOR PROCEDURE:  Anemia and heme-positive stools.   DESCRIPTION OF PROCEDURE:  The patient was placed in the left lateral  decubitus position then placed on the pulse monitor with continuous low-flow  oxygen delivered by nasal cannula. He was sedated with 12.5 mcg IV fentanyl  and 1 mg IV Versed in addition to the medicine given for the previous EGD.  The Olympus video colonoscope was inserted into the rectum and advanced to  the cecum, confirmed by transillumination at McBurney's point and  visualization at the ileocecal valve and appendiceal orifice. The prep was  good in general but slightly suboptimal in some areas and I could not rule  out small lesions less 1 cm in all locations. Otherwise the cecum which was  fairly well seen appeared normal and specifically no AVM's noted. The  ascending, transverse, descending and sigmoid colon also appeared normal and  the rectum likewise appeared normal with retroflexed view of the anus  revealing no significant enlargement of internal hemorrhoids. The scope was  then withdrawn and the patient returned to the recovery room in stable  condition. He tolerated the procedure well and there were no immediate  complications.   IMPRESSION:  Normal colonoscopy.   PLAN:  Will monitor stools and hemoglobin and if persistent bleeding will  probably need capsule endoscopy soon.       JCH/MEDQ  D:  03/31/2005  T:  03/31/2005  Job:  [id number]   cc:   Al Decant. Janey Greaser, MD  [address]  [place]  [place] [address]  Fax: [phone]

## 2011-02-04 NOTE — Procedures (Signed)
[facility]  Patient:    [name], [name]                         MRN: [id number] Proc. Date: 04/04/01 Adm. Date:  [id number] Attending:  Nelda Marseille CC:         Dellis Anes. Idell Pickles, M.D.   Procedure Report  PROCEDURE PERFORMED:  Colonoscopy  INDICATION:  The patient with guaiac positivity, bright red blood per rectum, family history of colon polyps due for colon screening. Consent was signed after risks, benefits, methods and options were thoroughly discussed in the office.  MEDICINES USED:  Demerol 50, Versed 5.  DESCRIPTION OF PROCEDURE:  Rectal inspection was pertinent for external hemorrhoids.  Digital exam was negative.  Video colonoscope was inserted, easily advanced to the mid ascending. We could see the ileocecal valve and the cecum in the distance.  To advance to the cecal pole root requiring rolling him on his back and abdominal pressure.  The cecum was identified by the appendiceal orifice and the ileocecal valve.  The scope was slowly withdrawn. The prep was adequate.   Upon insertion other than some left sided diverticula, no abnormalities were seen.  On slow withdraw through the colon, he had a long looping tortuous colon.  The prep was adequate; however, on slow withdraw, other than the left sided diverticula, no polypoid lesions, masses, or other abnormalities were seen as we slowly withdrew back to the rectum. Once back in the rectum, the scope was retroflexed revealing some internal hemorrhoids.  The scope was straightened and readvanced a short ways up.  The sigmoid air was suctioned.  The scope was removed. The patient tolerated the procedure well.  There were no obvious immediate complications.  ENDOSCOPIC DIAGNOSES: 1. Internal and external hemorrhoids. 2. Left sided diverticula. 3. Otherwise within normal limits to the cecum except for a long looping    colon.  PLAN:  Rescreen p.r.n. or probably in five years.   Continue workup with an EGD because of his aspirin and nonsteroidal use. DD:  04/04/01 TD:  04/05/01 Job: [id number]

## 2011-02-04 NOTE — Discharge Summary (Signed)
NAMEBERKLEY, CRONKRIGHT                  ACCOUNT NO.:  [id number]   MEDICAL RECORD NO.:  [id number]          PATIENT TYPE:  OBV   LOCATION:  1318                         [facility]ACILITY:  [facility]   PHYSICIAN:  Melissa L. Ladona Ridgel, MD  DATE OF BIRTH:  [birth year]   DATE OF ADMISSION:  03/25/2005  DATE OF DISCHARGE:  03/26/2005                                 DISCHARGE SUMMARY   DISCHARGE DIAGNOSES:  1.  Symptomatic anemia.  The patient had been complaining of several weeks      of fatigue, was seen in the primary care practitioner's office and found      to be hemoglobin of 8.3.  In the emergency room, the patient was      evaluated and found to have a decreased hemoglobin of 7.7.  He therefore      was admitted for further workup and treatment.  He was noted to have      heme positive stool on rectal examination with no hematochezia or      melena.  The fecal occult blood testing, however, was positive.  2.  History of hiatal  hernia with gastritis.  I suspect this is the cause      for the slow chronic loss of blood, however, he will need to be      evaluated as an outpatient with EGD and colonoscopy to assure that this      is the cause.  Since the patient was not actively bleeding profusely, it      was determined that he could return to home for an outpatient workup      next week.  The patient was transfused two units of packed red cells      with good results and felt much improved at the time of discharge.  We      therefore will make referral back to Dr. Ewing Schlein who did his original      studies several years ago and will place him on Prilosec 40 mg daily,      ferrous sulfate 325 mg daily.  3.  Hypertension.  This is stable on Diovan.  4.  Hyperlipidemia.  This is stable on Zocor.   The patient has been instructed not to take his aspirin.  He is to avoid  excessive coffee, spicy foods, alcohol and other NSAID-related products.   The patient is to make an appointment with Dr. Ewing Schlein on Monday  to follow up  for EGD colonoscopy.  He is to see Dr. Arley Phenix middle to end of next week to  recheck his hemoglobin.   DISCHARGE MEDICATIONS:  1.  Colace 100 mg b.i.d.  2.  Ferrous sulfate 325 mg t.i.d.  3.  Prilosec 40 mg daily.  4.  Diovan 160/12.5 daily.  5.  Zocor 40 mg daily.   HISTORY OF PRESENT ILLNESS:  The patient is a 77 year old white male with a  past medical history for a hiatal hernia with signs of gastritis on his last  EGD.  The patient states that over the past several weeks he has noticed  increasing  exertional fatigue, some exertional dyspnea.  The fatigue has  gotten to the point where he will take a chair out into the yard to do some  yardwork and sit down every couple of minutes.  The patient states that he  has been cold during the night for quite some time.  He states that he  really cannot pinpoint a moment when his symptoms started but he knows that  it was indeed gradual in onset.  The patient denied any melena, hematochezia  or hematemesis but in the emergency room, was found to have fecal occult  blood testing that was positive for blood.  The patient was therefore  admitted to the hospital.  He was transfused two units of packed red cells  with Lasix after each unit with good results.  During the course of the  hospital stay, the patient did not have a bowel movement that could be  further checked and therefore, further fecal occult blood testing was not  obtained.  In light of the fact that the patient was feeling well and  insisting upon going home, I did speak with GI and we felt that is was  appropriate for him to be discharged to home with followup early next week  for probable EGD colonoscopy.  On the day of discharge, the patient's vital  signs were stable, temperature was 98.1, blood pressure 120/60, pulse 72,  respirations 16.   GENERAL:  Well-developed, well-nourished white male in no acute distress.  Pupils are equal, round, reactive to light.   Extraocular muscles are intact.  Mucus membranes are moist.  NECK:  Supple, there is no JVD.  No lymph nodes, no carotid bruits.  His  pallor, which was noted on admission, had somewhat resolved at the time of  discharge and he appeared to have pink cheeks.  CHEST:  Clear to auscultation.  There were no rhonchi, rales or wheezes.  CARDIOVASCULAR:  Regular rate and rhythm.  Positive S1, S2.  No S3, S4.  No  murmurs, rubs or gallops.  ABDOMEN:  Soft, nontender, nondistended.  EXTREMITIES:  No clubbing, cyanosis or edema.   PERTINENT LABORATORY VALUES DURING THE COURSE OF THE HOSPITAL STAY:  Revealed discharging hemoglobin of 9.4 and 29.5.  This was up from his  admitting hemoglobin of 7.7 and hematocrit of 24.6.  His discharging BUN was  19 with a creatinine of 1.1.  Cardiac enzymes were obtained x 1.  These were  negative.  PT INR was within normal limits.  PTT was 29.  PT was 13.3 with  an INR of 1.0.  His reticulocyte count was 2.2% with a total iron of 10,  total iron binding capacity of 502 and a percent saturation of 2, all are  below normal.  His ferritin level also was low at 4.   At this time, the patient was deemed stable for discharge to follow up with  his primary care practitioner in GI for outpatient EGD colonoscopy and was  instructed on not taking his aspirin which may be causing slow upper GI  losses related to gastritis.  He was given my cell phone number should  anything occur over the weekend and I will copy this to Dr. Doran Clay  and Dr. Vida Rigger.  At the time of discharge, the patient is stable.       MLT/MEDQ  D:  03/27/2005  T:  03/27/2005  Job:  [id number]   cc:   Al Decant. Janey Greaser, MD  [id number]  [address]  [place]  [place] [address]  Fax: [phone]   Petra Kuba, M.D.  [address], [address]  [place]  [place] [address]  Fax: [phone]

## 2011-02-04 NOTE — Op Note (Signed)
[name], [name]                  ACCOUNT NO.:  [id number]   MEDICAL RECORD NO.:  [id number]          PATIENT TYPE:  AMB   LOCATION:  ENDO                         FACILITY:  [facility]   PHYSICIAN:  John C. Madilyn Fireman, M.D.    DATE OF BIRTH:  01-18-1931   DATE OF PROCEDURE:  03/31/2005  DATE OF DISCHARGE:                                 OPERATIVE REPORT   PROCEDURE:  Esophagogastroduodenoscopy with biopsy.   INDICATIONS FOR PROCEDURE:  Recurrent anemia and heme-positive stools.   DESCRIPTION OF PROCEDURE:  The patient was placed in the left lateral  decubitus position then placed on the pulse monitor with continuous low-flow  oxygen delivered by nasal cannula. He was sedated with 50 mcg IV fentanyl  and 5 mg IV Versed. The Olympus video endoscope was advanced under direct  vision into the oropharynx and esophagus. The esophagus was straight and of  normal caliber with the squamocolumnar line at 35 cm above approximately 3  1/2 cm hiatal hernia. There were no erosions seen within the hernia sac and  no visible ring, stricture or esophagitis. The stomach was entered and a  small amount of liquid secretions were suctioned from the fundus.  Retroflexed view of the cardia confirmed a hiatal hernia and was otherwise  unremarkable. The fundus and body appeared normal. Within the antrum, there  was mild erythema and granularity with no focal erosions or ulcers. A CLO-  test was obtained. The pylorus was nondeformed and easily allowed passage of  the endoscope tip into the duodenum. There was bulbar duodenitis with no  ulcer or erosion. The postbulbar duodenum appeared normal. No AVM's were  seen with the scope advanced as far as possible down the duodenum. The scope  was then withdrawn and the patient prepared for colonoscopy. He tolerated  the procedure well and there were no immediate complications.   IMPRESSION:  1.  Antral gastritis.  2.  Duodenitis.  3.  Fairly large hiatal hernia.   PLAN:   Await CLO-test for eradication of Helicobacter if positive otherwise  treat with proton pump inhibitor and will proceed with colonoscopy.       JCH/MEDQ  D:  03/31/2005  T:  03/31/2005  Job:  [id number]   cc:   Al Decant. Janey Greaser, MD  [address]  [place]  [place] [address]  Fax: [phone]

## 2011-06-17 LAB — POCT I-STAT, CHEM 8
BUN: 25 — ABNORMAL HIGH
Creatinine, Ser: 1.1
Glucose, Bld: 114 — ABNORMAL HIGH
Hemoglobin: 9.9 — ABNORMAL LOW
Potassium: 3.6
Sodium: 139

## 2011-06-17 LAB — BASIC METABOLIC PANEL
CO2: 24
Calcium: 8.4
Creatinine, Ser: 1.15
Glucose, Bld: 97
Sodium: 140

## 2011-06-17 LAB — RETICULOCYTES
RBC.: 3.78 — ABNORMAL LOW
Retic Count, Absolute: 41.6

## 2011-06-17 LAB — CROSSMATCH: Antibody Screen: NEGATIVE

## 2011-06-17 LAB — CBC
HCT: 27.1 — ABNORMAL LOW
HCT: 30.3 — ABNORMAL LOW
Hemoglobin: 8.3 — ABNORMAL LOW
Hemoglobin: 9.4 — ABNORMAL LOW
Hemoglobin: 9.4 — ABNORMAL LOW
Hemoglobin: 9.7 — ABNORMAL LOW
MCHC: 30.6
MCHC: 31.4
MCHC: 31.4
MCHC: 32
MCV: 74.5 — ABNORMAL LOW
MCV: 75.7 — ABNORMAL LOW
RBC: 3.64 — ABNORMAL LOW
RBC: 3.94 — ABNORMAL LOW
RBC: 4 — ABNORMAL LOW
RBC: 4.27
RDW: 18.4 — ABNORMAL HIGH
RDW: 18.6 — ABNORMAL HIGH
WBC: 5.6
WBC: 7.5

## 2011-06-17 LAB — DIFFERENTIAL
Basophils Relative: 1
Eosinophils Absolute: 0.3
Eosinophils Relative: 4
Lymphs Abs: 2.5
Monocytes Absolute: 0.5
Monocytes Relative: 6

## 2011-06-17 LAB — APTT: aPTT: 27

## 2011-06-17 LAB — PROTIME-INR: INR: 1

## 2011-06-17 LAB — COMPREHENSIVE METABOLIC PANEL
Albumin: 3.9
BUN: 18
Creatinine, Ser: 1.06
Total Protein: 7.3

## 2011-06-17 LAB — FERRITIN: Ferritin: 5 — ABNORMAL LOW (ref 22–322)

## 2011-06-17 LAB — URINALYSIS, ROUTINE W REFLEX MICROSCOPIC
Bilirubin Urine: NEGATIVE
Glucose, UA: NEGATIVE
Hgb urine dipstick: NEGATIVE
Specific Gravity, Urine: 1.022

## 2011-06-17 LAB — IRON AND TIBC: Iron: 11 — ABNORMAL LOW

## 2011-06-17 LAB — FOLATE: Folate: >20

## 2011-06-17 LAB — OCCULT BLOOD X 1 CARD TO LAB, STOOL: Fecal Occult Bld: POSITIVE

## 2012-10-08 ENCOUNTER — Emergency Department (HOSPITAL_COMMUNITY): Payer: Medicare Other

## 2012-10-08 ENCOUNTER — Encounter (HOSPITAL_COMMUNITY): Payer: Self-pay | Admitting: Emergency Medicine

## 2012-10-08 ENCOUNTER — Observation Stay (HOSPITAL_COMMUNITY)
Admission: EM | Admit: 2012-10-08 | Discharge: 2012-10-09 | Disposition: A | Discharge status: Home / Self Care | Payer: Medicare Other | Attending: Internal Medicine | Admitting: Internal Medicine

## 2012-10-08 DIAGNOSIS — E876 Hypokalemia: Secondary | ICD-10-CM | POA: Diagnosis not present

## 2012-10-08 DIAGNOSIS — I10 Essential (primary) hypertension: Secondary | ICD-10-CM | POA: Diagnosis present

## 2012-10-08 DIAGNOSIS — R531 Weakness: Secondary | ICD-10-CM

## 2012-10-08 DIAGNOSIS — N179 Acute kidney failure, unspecified: Secondary | ICD-10-CM | POA: Diagnosis present

## 2012-10-08 DIAGNOSIS — K219 Gastro-esophageal reflux disease without esophagitis: Secondary | ICD-10-CM | POA: Diagnosis present

## 2012-10-08 DIAGNOSIS — K560 Paralytic ileus: Secondary | ICD-10-CM | POA: Insufficient documentation

## 2012-10-08 DIAGNOSIS — E785 Hyperlipidemia, unspecified: Secondary | ICD-10-CM | POA: Diagnosis present

## 2012-10-08 DIAGNOSIS — K567 Ileus, unspecified: Secondary | ICD-10-CM | POA: Diagnosis present

## 2012-10-08 DIAGNOSIS — G2581 Restless legs syndrome: Secondary | ICD-10-CM | POA: Diagnosis present

## 2012-10-08 DIAGNOSIS — I951 Orthostatic hypotension: Principal | ICD-10-CM | POA: Diagnosis present

## 2012-10-08 DIAGNOSIS — E860 Dehydration: Secondary | ICD-10-CM | POA: Insufficient documentation

## 2012-10-08 DIAGNOSIS — R197 Diarrhea, unspecified: Secondary | ICD-10-CM

## 2012-10-08 HISTORY — DX: Malignant neoplasm of prostate: C61

## 2012-10-08 HISTORY — DX: Basal cell carcinoma of skin, unspecified: C44.91

## 2012-10-08 HISTORY — DX: Anemia, unspecified: D64.9

## 2012-10-08 HISTORY — DX: Restless legs syndrome: G25.81

## 2012-10-08 HISTORY — DX: Hyperlipidemia, unspecified: E78.5

## 2012-10-08 HISTORY — DX: Essential (primary) hypertension: I10

## 2012-10-08 HISTORY — DX: Gastro-esophageal reflux disease without esophagitis: K21.9

## 2012-10-08 LAB — COMPREHENSIVE METABOLIC PANEL
ALT: 12 U/L (ref 0–53)
AST: 26 U/L (ref 0–37)
CO2: 21 mEq/L (ref 19–32)
Chloride: 101 mEq/L (ref 96–112)
GFR calc non Af Amer: 27 mL/min — ABNORMAL LOW (ref >90)
Potassium: 4.2 mEq/L (ref 3.5–5.1)
Sodium: 136 mEq/L (ref 135–145)
Total Bilirubin: 0.5 mg/dL (ref 0.3–1.2)

## 2012-10-08 LAB — URINALYSIS, ROUTINE W REFLEX MICROSCOPIC
Glucose, UA: NEGATIVE mg/dL
Hgb urine dipstick: NEGATIVE
Leukocytes, UA: NEGATIVE
Specific Gravity, Urine: 1.023 (ref 1.005–1.030)
Urobilinogen, UA: 0.2 mg/dL (ref 0.0–1.0)

## 2012-10-08 LAB — CBC WITH DIFFERENTIAL/PLATELET
Basophils Absolute: 0 10*3/uL (ref 0.0–0.1)
HCT: 47.9 % (ref 39.0–52.0)
Lymphocytes Relative: 4 % — ABNORMAL LOW (ref 12–46)
Neutro Abs: 11.9 10*3/uL — ABNORMAL HIGH (ref 1.7–7.7)
Neutrophils Relative %: 87 % — ABNORMAL HIGH (ref 43–77)
Platelets: 248 10*3/uL (ref 150–400)
RDW: 13.3 % (ref 11.5–15.5)
WBC: 13.7 10*3/uL — ABNORMAL HIGH (ref 4.0–10.5)

## 2012-10-08 LAB — POCT I-STAT TROPONIN I: Troponin i, poc: 0 ng/mL (ref 0.00–0.08)

## 2012-10-08 LAB — LIPASE, BLOOD: Lipase: 19 U/L (ref 11–59)

## 2012-10-08 MED ORDER — TAMSULOSIN HCL 0.4 MG PO CAPS
0.4000 mg | ORAL_CAPSULE | Freq: Every day | ORAL | Status: DC
  Administered 2012-10-08 – 2012-10-09 (×2): 0.4 mg via ORAL
  Filled 2012-10-08 (×2): qty 1

## 2012-10-08 MED ORDER — PANTOPRAZOLE SODIUM 40 MG PO TBEC
40.0000 mg | DELAYED_RELEASE_TABLET | Freq: Every day | ORAL | Status: DC
  Administered 2012-10-08 – 2012-10-09 (×2): 40 mg via ORAL
  Filled 2012-10-08: qty 1

## 2012-10-08 MED ORDER — SODIUM CHLORIDE 0.9 % IV BOLUS (SEPSIS)
1000.0000 mL | Freq: Once | INTRAVENOUS | Status: AC
  Administered 2012-10-08: 1000 mL via INTRAVENOUS

## 2012-10-08 MED ORDER — ONDANSETRON HCL 4 MG/2ML IJ SOLN: 4.0000 mg | Freq: Four times a day (QID) | INTRAMUSCULAR | Status: DC | PRN

## 2012-10-08 MED ORDER — FERROUS SULFATE 325 (65 FE) MG PO TABS
325.0000 mg | ORAL_TABLET | Freq: Every day | ORAL | Status: DC
  Administered 2012-10-08 – 2012-10-09 (×2): 325 mg via ORAL
  Filled 2012-10-08 (×3): qty 1

## 2012-10-08 MED ORDER — ACETAMINOPHEN 650 MG RE SUPP: 650.0000 mg | Freq: Four times a day (QID) | RECTAL | Status: DC | PRN

## 2012-10-08 MED ORDER — SODIUM CHLORIDE 0.9 % IV SOLN
INTRAVENOUS | Status: DC
  Administered 2012-10-08: 1000 mL via INTRAVENOUS

## 2012-10-08 MED ORDER — DARIFENACIN HYDROBROMIDE ER 7.5 MG PO TB24
7.5000 mg | ORAL_TABLET | Freq: Every day | ORAL | Status: DC
  Administered 2012-10-08 – 2012-10-09 (×2): 7.5 mg via ORAL
  Filled 2012-10-08 (×2): qty 1

## 2012-10-08 MED ORDER — ADULT MULTIVITAMIN W/MINERALS CH
1.0000 | ORAL_TABLET | Freq: Every day | ORAL | Status: DC
  Administered 2012-10-08 – 2012-10-09 (×2): 1 via ORAL
  Filled 2012-10-08 (×2): qty 1

## 2012-10-08 MED ORDER — SACCHAROMYCES BOULARDII 250 MG PO CAPS
250.0000 mg | ORAL_CAPSULE | Freq: Two times a day (BID) | ORAL | Status: DC
  Administered 2012-10-08 – 2012-10-09 (×2): 250 mg via ORAL
  Filled 2012-10-08 (×3): qty 1

## 2012-10-08 MED ORDER — ONDANSETRON HCL 4 MG/2ML IJ SOLN: 4.0000 mg | Freq: Three times a day (TID) | INTRAMUSCULAR | Status: AC | PRN

## 2012-10-08 MED ORDER — ALUM & MAG HYDROXIDE-SIMETH 200-200-20 MG/5ML PO SUSP: 30.0000 mL | Freq: Four times a day (QID) | ORAL | Status: DC | PRN

## 2012-10-08 MED ORDER — OCUVITE PO TABS
1.0000 | ORAL_TABLET | Freq: Every day | ORAL | Status: DC
  Administered 2012-10-08 – 2012-10-09 (×2): 1 via ORAL
  Filled 2012-10-08 (×2): qty 1

## 2012-10-08 MED ORDER — ENOXAPARIN SODIUM 40 MG/0.4ML ~~LOC~~ SOLN: 40.0000 mg | SUBCUTANEOUS | Status: DC

## 2012-10-08 MED ORDER — ACETAMINOPHEN 325 MG PO TABS: 650.0000 mg | ORAL_TABLET | Freq: Four times a day (QID) | ORAL | Status: DC | PRN

## 2012-10-08 MED ORDER — ENOXAPARIN SODIUM 30 MG/0.3ML ~~LOC~~ SOLN
30.0000 mg | SUBCUTANEOUS | Status: DC
  Administered 2012-10-08: 30 mg via SUBCUTANEOUS
  Filled 2012-10-08 (×2): qty 0.3

## 2012-10-08 MED ORDER — ROPINIROLE HCL 1 MG PO TABS
2.0000 mg | ORAL_TABLET | Freq: Every day | ORAL | Status: DC
  Administered 2012-10-08: 2 mg via ORAL
  Filled 2012-10-08 (×2): qty 2

## 2012-10-08 MED ORDER — SIMVASTATIN 40 MG PO TABS
40.0000 mg | ORAL_TABLET | Freq: Every evening | ORAL | Status: DC
  Administered 2012-10-08: 40 mg via ORAL
  Filled 2012-10-08 (×2): qty 1

## 2012-10-08 MED ORDER — SODIUM CHLORIDE 0.9 % IV SOLN
Freq: Once | INTRAVENOUS | Status: AC
  Administered 2012-10-08: 12:00:00 via INTRAVENOUS

## 2012-10-08 MED ORDER — ONDANSETRON HCL 4 MG/2ML IJ SOLN: 4.0000 mg | Freq: Once | INTRAMUSCULAR | Status: DC

## 2012-10-08 MED ORDER — OMEGA-3-ACID ETHYL ESTERS 1 G PO CAPS
1.0000 g | ORAL_CAPSULE | Freq: Every day | ORAL | Status: DC
  Administered 2012-10-08 – 2012-10-09 (×2): 1 g via ORAL
  Filled 2012-10-08 (×2): qty 1

## 2012-10-08 MED ORDER — SODIUM CHLORIDE 0.9 % IV SOLN
INTRAVENOUS | Status: AC
  Administered 2012-10-09: 02:00:00 via INTRAVENOUS

## 2012-10-08 MED ORDER — OXYCODONE HCL 5 MG PO TABS: 5.0000 mg | ORAL_TABLET | ORAL | Status: DC | PRN

## 2012-10-08 MED ORDER — ONDANSETRON HCL 4 MG PO TABS: 4.0000 mg | ORAL_TABLET | Freq: Four times a day (QID) | ORAL | Status: DC | PRN

## 2012-10-08 NOTE — ED Notes (Addendum)
Pt went to PCP this am for 5 days of N/V/D.  While at PCP, pt became dizzy and was sent to ED for evaluation of dehydration.  Pt given 4mg  Zofran IV per EMS.

## 2012-10-08 NOTE — ED Notes (Signed)
Admitting doctor at bedside 

## 2012-10-08 NOTE — ED Notes (Signed)
Bed:WA04<BR> Expected date:<BR> Expected time:<BR> Means of arrival:<BR> Comments:<BR> EMS

## 2012-10-08 NOTE — ED Notes (Signed)
Pt unable to void at this time. 

## 2012-10-08 NOTE — H&P (Signed)
[facility] History and Physical  [name]:[id number] DOB: 22-Apr-1931 DOA: 10/08/2012  Referring physician: Gerhard Munch, MD PCP: Daisy Floro, MD   Chief Complaint: N/V/D   History of Present Illness: [name] is an 79 y.o. male with PMH of GERD, HTN, hyperlipidemia but no history of chronic GI issues who presented to the hospital today with 1 week of progressively worse diarrhea, now watery, but no hematochezia or melena.  Symptoms are associated with gas, but no tenesmus or abdominal cramping.  Saw his PCP today due to progressive symptoms, with new onset of dizziness and pre-syncope.  No recent antibiotic use.  No sick contacts.  No significant nausea or vomiting.  The patient states he has had episodes of diarrhea in the past, but they have usually resolved in 2 days with the help of probiotics. Upon initial evaluation emergency department, the patient was found to have acute renal insufficiency and orthostatic hypotension, and was referred to the hospitalist service for further evaluation and treatment.  Review of Systems: Constitutional: No fever, no chills;  Appetite normal; + 10 lb weight loss, no weight gain, + fatigue.  HEENT: No blurry vision, no diplopia, no pharyngitis, no dysphagia CV: No chest pain, no palpitations.  Resp: No SOB, + chronic cough. GI: + nausea, no vomiting, + diarrhea, no melena, no hematochezia.  GU: No dysuria, no hematuria.  MSK: no myalgias, no arthralgias.  Neuro:  No headache, no focal neurological deficits, no history of seizures.  Psych: No depression, no anxiety.  Endo: No thyroid disease, no DM, no heat intolerance, no cold intolerance, no polyuria, no polydipsia  Skin: No rashes, no skin lesions.  Heme: No easy bruising, no history of blood diseases.  Past Medical History Past Medical History  Diagnosis Date  . Anemia   . Prostate cancer   . Basal cell cancer   . Hypertension   . Hyperlipidemia   . Restless leg   . GERD  (gastroesophageal reflux disease)      Past Surgical History Past Surgical History  Procedure Date  . Joint replacement     right knee replacement  . Bilateral cateract surgery      Social History: History   Social History  . Marital Status: Married    Spouse Name: Elease Hashimoto    Number of Children: N/A  . Years of Education: N/A   Occupational History  . Retired, Chartered certified accountant    Social History Main Topics  . Smoking status: Never Smoker   . Smokeless tobacco: Former Neurosurgeon    Types: Chew    Quit date: 10/08/1973  . Alcohol Use: No  . Drug Use: No  . Sexually Active:    Other Topics Concern  . Not on file   Social History Narrative   Married.  Lives with wife in [place].  Normally independent of ADLs.    Family History:  Family History  Problem Relation Age of Onset  . Parkinsonism Mother   . Dementia Father     Allergies: Asa and Vicodin  Meds: Prior to Admission medications   Medication Sig Start Date End Date Taking? Authorizing Provider  beta carotene w/minerals (OCUVITE) tablet Take 1 tablet by mouth daily.   Yes Historical Provider, MD  ferrous sulfate 325 (65 FE) MG EC tablet Take 325 mg by mouth daily with breakfast.   Yes Historical Provider, MD  Multiple Vitamins-Minerals (MULTIVITAMIN WITH MINERALS) tablet Take 1 tablet by mouth daily.   Yes Historical Provider, MD  Omega-3  Fatty Acids (FISH OIL) 1000 MG CAPS Take 1,000 mg by mouth every morning.   Yes Historical Provider, MD  omeprazole (PRILOSEC) 20 MG capsule Take 20 mg by mouth daily.   Yes Historical Provider, MD  rOPINIRole (REQUIP) 2 MG tablet Take 2 mg by mouth at bedtime.   Yes Historical Provider, MD  simvastatin (ZOCOR) 40 MG tablet Take 40 mg by mouth every evening.   Yes Historical Provider, MD  solifenacin (VESICARE) 5 MG tablet Take 5 mg by mouth daily.   Yes Historical Provider, MD  Tamsulosin HCl (FLOMAX) 0.4 MG CAPS Take 0.4 mg by mouth daily.   Yes Historical Provider, MD    valsartan-hydrochlorothiazide (DIOVAN-HCT) 160-12.5 MG per tablet Take 1 tablet by mouth daily.   Yes Historical Provider, MD    Physical Exam: Filed Vitals:   10/08/12 1147 10/08/12 1148 10/08/12 1149 10/08/12 1417  BP: 92/55 96/60 77/51  113/57  Pulse: 81 85 89 72  Temp:    98 F (36.7 C)  TempSrc:      Resp:    18  SpO2:    100%     Physical Exam: Blood pressure 113/57, pulse 72, temperature 98 F (36.7 C), temperature source Oral, resp. rate 18, SpO2 100.00%. Gen: No acute distress. Head: Normocephalic, atraumatic. Eyes: Pupils round, equally reactive to light and accommodation. Extraocular movements intact. Sclerae nonicteric Mouth: Mucous membranes dry. No exudates. Fair dentition. Neck: Supple, no thyromegaly, lymphadenopathy, jugular venous distention. Chest: Lungs clear to auscultation bilaterally. Good air movement. CV: Regular rate, and rhythm. No murmurs, rubs, or gallops. Abdomen: Abdomen softly distended. Mildly tender to deep palpation. Positive bowel sounds. Extremities: No clubbing, edema, or cyanosis. Skin: Warm and dry. No rashes. Neuro: Alert and oriented x3. Cranial nerves II through XII are grossly intact. Nonfocal. Psych: Affect normal.  Labs on Admission:  Basic Metabolic Panel:  Lab 10/08/12 1610  NA 136  K 4.2  CL 101  CO2 21  GLUCOSE 141*  BUN 41*  CREATININE 2.19*  CALCIUM 9.2  MG --  PHOS --   Liver Function Tests:  Lab 10/08/12 1120  AST 26  ALT 12  ALKPHOS 48  BILITOT 0.5  PROT 7.5  ALBUMIN 3.4*    Lab 10/08/12 1120  LIPASE 19  AMYLASE --   CBC:  Lab 10/08/12 1120  WBC 13.7*  NEUTROABS 11.9*  HGB 16.0  HCT 47.9  MCV 96.6  PLT 248    Radiological Exams on Admission: Dg Abd Acute W/chest  10/08/2012  *RADIOLOGY REPORT*  Clinical Data: Nausea, vomiting, diarrhea and abdominal distention.  ACUTE ABDOMEN SERIES (ABDOMEN 2 VIEW & CHEST 1 VIEW)  Comparison: CT chest 04/06/2009 and chest radiograph 04/11/2008.   Findings: Trachea is midline.  Heart size normal.  Large hiatal hernia.  Lungs are low in volume but clear.  Two views of the abdomen show gaseous distention of colon with associated air fluid levels.  No definite small bowel dilatation.  IMPRESSION:  1.  Bowel gas pattern is indicative of an ileus.  Distal colonic obstruction cannot be definitively excluded. 2.  Large hiatal hernia.   Original Report Authenticated By: Leanna Battles, M.D.     EKG: Independently reviewed. Normal sinus rhythm at 83 beats per minute. Right ventricular hypertrophy  Assessment/Plan Principal Problem:  *Orthostatic hypotension, dehydration secondary to diarrhea  Admit for observation. Hydrate. Send stools for culture and Clostridium difficile.  Hold antihypertensives.  Start probiotics. Active Problems:  Ileus  No evidence of electrolyte disturbance.  Acute  kidney injury  Secondary to dehydration in the setting of ongoing diuretic and ARB use. Hydrate and monitor. Baseline creatinine is 1-1.1.  Hypertension  Blood pressure is low. Hold antihypertensives.  Hyperlipidemia  Continue simvastatin and fish oil.  GERD (gastroesophageal reflux disease)  Continue PPI therapy.  Restless leg syndrome  Continue Requip.  Code Status: Full. Family Communication: [name] (wife) [phone], updated at bedside. Disposition Plan: Home when stable, next 24-48 hours.  Time spent: 1 hour.  Dorean Daniello [facility] Pager [phone]  If 7PM-7AM, please contact night-coverage [email or web address] Password [email or web address] 10/08/2012, 3:00 PM

## 2012-10-08 NOTE — ED Notes (Signed)
Report given to [employer] in [place]

## 2012-10-08 NOTE — ED Provider Notes (Signed)
History     CSN: [id number]  Arrival date & time 10/08/12  1028   First MD Initiated Contact with Patient 10/08/12 1042      Chief Complaint  Patient presents with  . Emesis  . Diarrhea  . Dizziness    (Consider location/radiation/quality/duration/timing/severity/associated sxs/prior treatment) HPI [name] is a 79 y.o. male who presents with complaint of diarrhea for 5 days. States symptoms started with loose stools, now diarrhea is watery. States multiple episodes during the day, last night, did not get off the commode. States nausea vomiting started this morning, as well as dizziness. Went to his PCP, where he was found to be very dizzy, and sent here for further evaluation. Pt denies abdominal pain, but states it feels bloaded. Pt denies taking any medications for this. denies fever, chills, chest pain, SOB. No recent travel. No other complaints.  Past Medical History  Diagnosis Date  . Anemia   . Prostate cancer   . Basal cell cancer   . Hypertension   . Hyperlipidemia   . Restless leg     History reviewed. No pertinent past surgical history.  History reviewed. No pertinent family history.  History  Substance Use Topics  . Smoking status: Never Smoker   . Smokeless tobacco: Not on file  . Alcohol Use: No      Review of Systems  Constitutional: Negative for fever and chills.  Respiratory: Negative.   Cardiovascular: Negative.   Gastrointestinal: Positive for nausea, vomiting and diarrhea. Negative for blood in stool.  Neurological: Positive for dizziness, weakness and light-headedness.  All other systems reviewed and are negative.    Allergies  Asa and Vicodin  Home Medications   Current Outpatient Rx  Name  Route  Sig  Dispense  Refill  . OCUVITE PO TABS   Oral   Take 1 tablet by mouth daily.         Marland Kitchen FERROUS SULFATE 325 (65 FE) MG PO TBEC   Oral   Take 325 mg by mouth daily with breakfast.         . MULTI-VITAMIN/MINERALS PO TABS  Oral   Take 1 tablet by mouth daily.         Marland Kitchen FISH OIL 1000 MG PO CAPS   Oral   Take 1,000 mg by mouth every morning.         Marland Kitchen OMEPRAZOLE 20 MG PO CPDR   Oral   Take 20 mg by mouth daily.         Marland Kitchen ROPINIROLE HCL 2 MG PO TABS   Oral   Take 2 mg by mouth at bedtime.         Marland Kitchen SIMVASTATIN 40 MG PO TABS   Oral   Take 40 mg by mouth every evening.         Marland Kitchen SOLIFENACIN SUCCINATE 5 MG PO TABS   Oral   Take 5 mg by mouth daily.         Marland Kitchen TAMSULOSIN HCL 0.4 MG PO CAPS   Oral   Take 0.4 mg by mouth daily.         Marland Kitchen VALSARTAN-HYDROCHLOROTHIAZIDE 160-12.5 MG PO TABS   Oral   Take 1 tablet by mouth daily.           BP 138/109  Pulse 90  Temp 97.8 F (36.6 C) (Oral)  Resp 18  SpO2 94%  Physical Exam  Nursing note and vitals reviewed. Constitutional: He appears well-developed and well-nourished. No distress.  HENT:       Oral mucosa dry  Eyes: Conjunctivae normal are normal.  Neck: Neck supple.  Cardiovascular: Normal rate, regular rhythm and normal heart sounds.   Pulmonary/Chest: Effort normal and breath sounds normal. No respiratory distress. He has no wheezes. He has no rales.  Abdominal: Soft. Bowel sounds are normal. He exhibits no distension. There is no tenderness. There is no rebound.  Musculoskeletal: He exhibits no edema.  Neurological: He is alert.  Skin: Skin is warm and dry.    ED Course  Procedures (including critical care time)  Pt with weakness and dizziness. Diarrhea for 5 days. Fluids started. Labs ordered.   Results for orders placed during the hospital encounter of 10/08/12  CBC WITH DIFFERENTIAL      Component Value Range   WBC 13.7 (*) 4.0 - 10.5 K/uL   RBC 4.96  4.22 - 5.81 MIL/uL   Hemoglobin 16.0  13.0 - 17.0 g/dL   HCT 16.1  09.6 - 04.5 %   MCV 96.6  78.0 - 100.0 fL   MCH 32.3  26.0 - 34.0 pg   MCHC 33.4  30.0 - 36.0 g/dL   RDW 40.9  81.1 - 91.4 %   Platelets 248  150 - 400 K/uL   Neutrophils Relative 87 (*) 43 -  77 %   Neutro Abs 11.9 (*) 1.7 - 7.7 K/uL   Lymphocytes Relative 4 (*) 12 - 46 %   Lymphs Abs 0.6 (*) 0.7 - 4.0 K/uL   Monocytes Relative 8  3 - 12 %   Monocytes Absolute 1.1 (*) 0.1 - 1.0 K/uL   Eosinophils Relative 1  0 - 5 %   Eosinophils Absolute 0.1  0.0 - 0.7 K/uL   Basophils Relative 0  0 - 1 %   Basophils Absolute 0.0  0.0 - 0.1 K/uL  COMPREHENSIVE METABOLIC PANEL      Component Value Range   Sodium 136  135 - 145 mEq/L   Potassium 4.2  3.5 - 5.1 mEq/L   Chloride 101  96 - 112 mEq/L   CO2 21  19 - 32 mEq/L   Glucose, Bld 141 (*) 70 - 99 mg/dL   BUN 41 (*) 6 - 23 mg/dL   Creatinine, Ser 7.82 (*) 0.50 - 1.35 mg/dL   Calcium 9.2  8.4 - 95.6 mg/dL   Total Protein 7.5  6.0 - 8.3 g/dL   Albumin 3.4 (*) 3.5 - 5.2 g/dL   AST 26  0 - 37 U/L   ALT 12  0 - 53 U/L   Alkaline Phosphatase 48  39 - 117 U/L   Total Bilirubin 0.5  0.3 - 1.2 mg/dL   GFR calc non Af Amer 27 (*) >90 mL/min   GFR calc Af Amer 31 (*) >90 mL/min  LIPASE, BLOOD      Component Value Range   Lipase 19  11 - 59 U/L  POCT I-STAT TROPONIN I      Component Value Range   Troponin i, poc 0.00  0.00 - 0.08 ng/mL   Comment 3            Dg Abd Acute W/chest  10/08/2012  *RADIOLOGY REPORT*  Clinical Data: Nausea, vomiting, diarrhea and abdominal distention.  ACUTE ABDOMEN SERIES (ABDOMEN 2 VIEW & CHEST 1 VIEW)  Comparison: CT chest 04/06/2009 and chest radiograph 04/11/2008.  Findings: Trachea is midline.  Heart size normal.  Large hiatal hernia.  Lungs are low in volume but clear.  Two views of the abdomen show gaseous distention of colon with associated air fluid levels.  No definite small bowel dilatation.  IMPRESSION:  1.  Bowel gas pattern is indicative of an ileus.  Distal colonic obstruction cannot be definitively excluded. 2.  Large hiatal hernia.   Original Report Authenticated By: Leanna Battles, M.D.        1. Dehydration   2. Diarrhea   3. Weakness       MDM  Pt with nausea, diarrhea. No abdominal  pain. Appears dehydrated on exam. Labs show renal inssuficiency, creatinine 2. Most likely due to dehydration. He is orthostatic with BP dropping into 70s upon standing. He will need more IV fluids, and obs in the hospital. Pt unable to provide urine in ED. Cultures of stool obtained and sent for c diff and cultures.   Filed Vitals:   10/08/12 1626  BP: 125/59  Pulse: 72  Temp: 98.1 F (36.7 C)  Resp: [address] A Kerri Asche, [place] 10/08/12 2047

## 2012-10-08 NOTE — Progress Notes (Signed)
Pt. States he started having diarrhea last Wednesday, and was having the dry heaves. Last time had the dry heaves was 1000 this AM. Since coming to the floor he has not vomited or had diarrhea. His appetite was excellent, he ate 100% of his dinner. States he feels great. Instructed pt to call for a nurse if he needs to get out of bed. T. States he is getting a physical next month and will ask his MD if he is due for his pneumonia vaccine. States he did receive the flu vaccine this season.

## 2012-10-08 NOTE — ED Notes (Signed)
Floor RN unavailable for report at this time. 

## 2012-10-09 DIAGNOSIS — E860 Dehydration: Secondary | ICD-10-CM

## 2012-10-09 DIAGNOSIS — E876 Hypokalemia: Secondary | ICD-10-CM | POA: Diagnosis not present

## 2012-10-09 LAB — BASIC METABOLIC PANEL
Calcium: 8.2 mg/dL — ABNORMAL LOW (ref 8.4–10.5)
Creatinine, Ser: 1.36 mg/dL — ABNORMAL HIGH (ref 0.50–1.35)
GFR calc non Af Amer: 48 mL/min — ABNORMAL LOW (ref >90)
Glucose, Bld: 95 mg/dL (ref 70–99)
Sodium: 138 mEq/L (ref 135–145)

## 2012-10-09 LAB — CBC
MCH: 32.3 pg (ref 26.0–34.0)
Platelets: 227 10*3/uL (ref 150–400)
RBC: 4.05 MIL/uL — ABNORMAL LOW (ref 4.22–5.81)
RDW: 13.6 % (ref 11.5–15.5)
WBC: 8.8 10*3/uL (ref 4.0–10.5)

## 2012-10-09 LAB — CLOSTRIDIUM DIFFICILE BY PCR: Toxigenic C. Difficile by PCR: NEGATIVE

## 2012-10-09 MED ORDER — SACCHAROMYCES BOULARDII 250 MG PO CAPS: 250.0000 mg | ORAL_CAPSULE | Freq: Two times a day (BID) | ORAL | Status: DC

## 2012-10-09 MED ORDER — POTASSIUM CHLORIDE CRYS ER 20 MEQ PO TBCR
20.0000 meq | EXTENDED_RELEASE_TABLET | Freq: Once | ORAL | Status: AC
  Administered 2012-10-09: 20 meq via ORAL
  Filled 2012-10-09: qty 1

## 2012-10-09 MED ORDER — ROPINIROLE HCL 1 MG PO TABS
2.0000 mg | ORAL_TABLET | ORAL | Status: DC
  Filled 2012-10-09: qty 2

## 2012-10-09 MED ORDER — POTASSIUM CHLORIDE CRYS ER 20 MEQ PO TBCR
20.0000 meq | EXTENDED_RELEASE_TABLET | Freq: Two times a day (BID) | ORAL | Status: DC
  Administered 2012-10-09: 20 meq via ORAL
  Filled 2012-10-09 (×2): qty 1

## 2012-10-09 MED ORDER — ENOXAPARIN SODIUM 40 MG/0.4ML ~~LOC~~ SOLN
40.0000 mg | SUBCUTANEOUS | Status: DC
  Filled 2012-10-09: qty 0.4

## 2012-10-09 MED ORDER — POTASSIUM CHLORIDE IN NACL 20-0.9 MEQ/L-% IV SOLN
INTRAVENOUS | Status: DC
  Administered 2012-10-09: 10:00:00 via INTRAVENOUS
  Filled 2012-10-09 (×2): qty 1000

## 2012-10-09 NOTE — ED Provider Notes (Signed)
Medical screening examination/treatment/procedure(s) were conducted as a shared visit with non-physician practitioner(s) and myself.  I personally evaluated the patient during the encounter On my exam this elderly M was resting, supine.  His evaluation demonstrated acute renal dysfunction, and he was admitted for further E/M.  Gerhard Munch, MD 10/09/12 [phone]

## 2012-10-09 NOTE — Discharge Summary (Addendum)
Physician Discharge Summary  [name] WUJ:[id number] DOB: 02-Oct-1930 DOA: 10/08/2012  PCP: Paul Floro, MD  Admit date: 10/08/2012 Discharge date: 10/09/2012  Recommendations for Outpatient Follow-up:  1. Instructed to followup with primary care physician for return of diarrhea or symptoms suggestive of dehydration such as dizziness, weakness. 2. Recommend a followup creatinine at next visit to ensure that his kidney function has returned to baseline.  Discharge Diagnoses:  Principal Problem:  *Orthostatic hypotension, dehydration secondary to diarrhea Active Problems:   Ileus   Acute kidney injury   Hypertension   Hyperlipidemia   GERD (gastroesophageal reflux disease)   Restless leg syndrome   Hypokalemia   Discharge Condition: Improved.  Diet recommendation: Low-sodium, heart healthy.  History of present illness:  Mr. [name] is a 79 year old man who was admitted to the hospital on 10/08/2012 with orthostatic hypotension and dehydration secondary to diarrhea.  Hospital Course by problem:  Principal Problem:  *Orthostatic hypotension, dehydration secondary to diarrhea   Admitted for observation. Hydrated overnight. Diarrhea resolved with no further episodes.   Held antihypertensives while in hospital.   Continue probiotics. Active Problems:  Hypokalemia   Replaced orally and in IV fluids. Ileus   Tolerating regular diet without return of symptoms. No nausea or vomiting. Acute kidney injury   Secondary to dehydration in the setting of ongoing diuretic and ARB use. Hydrated. Baseline creatinine is 1-1.1. Current creatinine improved over the past 24 hours, but not yet back to baseline. Hypertension   Blood pressure was low on admission. Antihypertensives held. Blood pressure now becoming elevated. Can resume antihypertensives at discharge Hyperlipidemia   Continue simvastatin and fish oil. GERD (gastroesophageal reflux disease)   Continue PPI  therapy. Restless leg syndrome   Continue Requip.  Procedures:  None.  Consultations:  None.   Discharge Exam: Filed Vitals:   10/09/12 0500  BP: 111/60  Pulse: 67  Temp: 98 F (36.7 C)  Resp: 16   Filed Vitals:   10/08/12 1417 10/08/12 1626 10/08/12 2145 10/09/12 0500  BP: 113/57 125/59 106/56 111/60  Pulse: 72 72 67 67  Temp: 98 F (36.7 C) 98.1 F (36.7 C) 98.1 F (36.7 C) 98 F (36.7 C)  TempSrc:  Oral Oral Oral  Resp: 18 16 16 16   Height:  5\' 8"  (1.727 m)    Weight:  88.4 kg (194 lb 14.2 oz)    SpO2: 100%  96% 97%    Gen:  NAD Cardiovascular:  RRR, No M/R/G Respiratory: Lungs CTAB Gastrointestinal: Abdomen soft, NT/ND with normal active bowel sounds. Extremities: No C/E/C   Discharge Instructions      Discharge Orders    Future Orders Please Complete By Expires   Diet - low sodium heart healthy      Increase activity slowly      Call MD for:      Scheduling Instructions:   Recurrent diarrhea.   Call MD for:  persistant dizziness or light-headedness      Call MD for:  extreme fatigue          Medication List     As of 10/09/2012 10:59 AM    TAKE these medications         ferrous sulfate 325 (65 FE) MG EC tablet   Take 325 mg by mouth daily with breakfast.      Fish Oil 1000 MG Caps   Take 1,000 mg by mouth every morning.      multivitamin with minerals tablet   Take 1 tablet  by mouth daily.      beta carotene w/minerals tablet   Take 1 tablet by mouth daily.      omeprazole 20 MG capsule   Commonly known as: PRILOSEC   Take 20 mg by mouth daily.      rOPINIRole 2 MG tablet   Commonly known as: REQUIP   Take 2 mg by mouth at bedtime.      saccharomyces boulardii 250 MG capsule   Commonly known as: FLORASTOR   Take 1 capsule (250 mg total) by mouth 2 (two) times daily.      simvastatin 40 MG tablet   Commonly known as: ZOCOR   Take 40 mg by mouth every evening.      solifenacin 5 MG tablet   Commonly known as: VESICARE     Take 5 mg by mouth daily.      Tamsulosin HCl 0.4 MG Caps   Commonly known as: FLOMAX   Take 0.4 mg by mouth daily.      valsartan-hydrochlorothiazide 160-12.5 MG per tablet   Commonly known as: DIOVAN-HCT   Take 1 tablet by mouth daily.        Follow-up Information    Schedule an appointment as soon as possible for a visit with Paul Floro, MD. (As needed for any return of diarrhea.)    Contact information:   [address] [place] [address] [phone]           The results of significant diagnostics from this hospitalization (including imaging, microbiology, ancillary and laboratory) are listed below for reference.    Significant Diagnostic Studies: Dg Abd Acute W/chest  10/08/2012  *RADIOLOGY REPORT*  Clinical Data: Nausea, vomiting, diarrhea and abdominal distention.  ACUTE ABDOMEN SERIES (ABDOMEN 2 VIEW & CHEST 1 VIEW)  Comparison: CT chest 04/06/2009 and chest radiograph 04/11/2008.  Findings: Trachea is midline.  Heart size normal.  Large hiatal hernia.  Lungs are low in volume but clear.  Two views of the abdomen show gaseous distention of colon with associated air fluid levels.  No definite small bowel dilatation.  IMPRESSION:  1.  Bowel gas pattern is indicative of an ileus.  Distal colonic obstruction cannot be definitively excluded. 2.  Large hiatal hernia.   Original Report Authenticated By: Leanna Battles, M.D.     Microbiology: No results found for this or any previous visit (from the past 240 hour(s)).   Labs: Basic Metabolic Panel:  Lab 10/09/12 9147 10/08/12 1120  NA 138 136  K 3.2* 4.2  CL 106 101  CO2 23 21  GLUCOSE 95 141*  BUN 31* 41*  CREATININE 1.36* 2.19*  CALCIUM 8.2* 9.2  MG -- --  PHOS -- --   Liver Function Tests:  Lab 10/08/12 1120  AST 26  ALT 12  ALKPHOS 48  BILITOT 0.5  PROT 7.5  ALBUMIN 3.4*    Lab 10/08/12 1120  LIPASE 19  AMYLASE --   CBC:  Lab 10/09/12 0405 10/08/12 1120  WBC 8.8 13.7*   NEUTROABS -- 11.9*  HGB 13.1 16.0  HCT 39.1 47.9  MCV 96.5 96.6  PLT 227 248    Time coordinating discharge: 25 minutes   Signed:  RAMA,CHRISTINA  Pager [phone] [facility] 10/09/2012, 10:59 AM

## 2013-07-31 ENCOUNTER — Encounter (INDEPENDENT_AMBULATORY_CARE_PROVIDER_SITE_OTHER): Payer: Medicare Other | Admitting: Ophthalmology

## 2013-07-31 DIAGNOSIS — H35039 Hypertensive retinopathy, unspecified eye: Secondary | ICD-10-CM

## 2013-07-31 DIAGNOSIS — H43819 Vitreous degeneration, unspecified eye: Secondary | ICD-10-CM

## 2013-07-31 DIAGNOSIS — H3530 Unspecified macular degeneration: Secondary | ICD-10-CM

## 2013-07-31 DIAGNOSIS — H35379 Puckering of macula, unspecified eye: Secondary | ICD-10-CM

## 2013-07-31 DIAGNOSIS — D3130 Benign neoplasm of unspecified choroid: Secondary | ICD-10-CM

## 2013-07-31 DIAGNOSIS — I10 Essential (primary) hypertension: Secondary | ICD-10-CM

## 2013-08-14 ENCOUNTER — Encounter: Payer: Self-pay | Admitting: Neurology

## 2013-08-20 ENCOUNTER — Encounter: Payer: Self-pay | Admitting: Neurology

## 2013-08-20 ENCOUNTER — Ambulatory Visit (INDEPENDENT_AMBULATORY_CARE_PROVIDER_SITE_OTHER): Payer: Medicare Other | Admitting: Neurology

## 2013-08-20 VITALS — BP 151/86 | HR 86 | Ht 65.5 in | Wt 210.0 lb

## 2013-08-20 DIAGNOSIS — R2681 Unsteadiness on feet: Secondary | ICD-10-CM | POA: Insufficient documentation

## 2013-08-20 DIAGNOSIS — H905 Unspecified sensorineural hearing loss: Secondary | ICD-10-CM

## 2013-08-20 DIAGNOSIS — R531 Weakness: Secondary | ICD-10-CM

## 2013-08-20 DIAGNOSIS — R5381 Other malaise: Secondary | ICD-10-CM

## 2013-08-20 DIAGNOSIS — R269 Unspecified abnormalities of gait and mobility: Secondary | ICD-10-CM

## 2013-08-20 NOTE — Progress Notes (Signed)
[facility]    Provider:  Dr Hosie Poisson Referring Provider: Duane Lope, MD Primary Care Physician:   Duane Lope, MD  CC:  Left sided weakness and sensory changes.   HPI:  Paul Dunlevy. is a 79 y.o. male here as a referral from Dr. Tenny Craw for evaluation of left sided sensory changes and pain  In September patient began to develop left neck and arm pain and sensory loss, initially lasted around 10 minutes, resolved and returned. Wife noted he was dragging his left foot, would come and go and has then gotten progressively worse Now involves pain/sensory changes/weakness in LUE and LLE. Feels it is getting progressively worse, now having difficulty with fine motor tasks on the left hand, left leg and arm feels heavy. No associated change in vision or speech. No noted deficits with visual fields. No known history of trauma to head or neck. No change in bowel/bladder. No saddle anesthesia. Has some gait instability, feels is partly due to weakness.   Prior to this was an active healthy person. No prior history of strokes. No history of HTN, CAD, HLD, DM, no cardiac arrythmia.    Reviewed MRI notes from  orthopedics, MR thoracic pertinent for T9-10 moderate canal and paracentral extrusion with mild canal stenosis and cord deformity T7-1 small bilateral posterolateral protrusion without cord deformity, cervical MRI pertinent for C5-6 mild canal stenosis and cord deformity, small to moderate left posterolateral extrusion superimposed on disc bulge with endplate spurring, greater mass effect than left C6 root than right  Review of Systems: Out of a complete 14 system review, the patient complains of only the following symptoms, and all other reviewed systems are negative. Positive restless legs joint pain  History   Social History  . Marital Status: Married    Spouse Name: Elease Hashimoto    Number of Children: 2  . Years of Education: 12   Occupational History  . Retired, Chartered certified accountant     Social History Main Topics  . Smoking status: Never Smoker   . Smokeless tobacco: Former Neurosurgeon    Types: Chew    Quit date: 10/08/1973  . Alcohol Use: No  . Drug Use: No  . Sexual Activity:    Other Topics Concern  . Not on file   Social History Narrative   Patient is married([name]) lives with wife in [place].  Normally independent of ADLs.   Patient is retired.   Patient has two children.   Patient has a high school education.             Family History  Problem Relation Age of Onset  . Parkinsonism Mother   . Dementia Father     Past Medical History  Diagnosis Date  . Anemia   . Prostate cancer   . Basal cell cancer   . Hypertension   . Hyperlipidemia   . Restless leg   . GERD (gastroesophageal reflux disease)     Past Surgical History  Procedure Laterality Date  . Joint replacement      right knee replacement  . Bilateral cateract surgery    . Vasectomy    . Tonsillectomy    . Colonoscopy      colon polyp removed    Current Outpatient Prescriptions  Medication Sig Dispense Refill  . beta carotene w/minerals (OCUVITE) tablet Take 1 tablet by mouth daily.      . ferrous sulfate 325 (65 FE) MG EC tablet Take 325 mg by mouth daily with breakfast.      .  Multiple Vitamins-Minerals (MULTIVITAMIN WITH MINERALS) tablet Take 1 tablet by mouth daily.      . Omega-3 Fatty Acids (FISH OIL) 1000 MG CAPS Take 1,000 mg by mouth every morning.      Marland Kitchen omeprazole (PRILOSEC) 20 MG capsule Take 20 mg by mouth daily.      Marland Kitchen rOPINIRole (REQUIP) 2 MG tablet Take 2 mg by mouth at bedtime.      . saccharomyces boulardii (FLORASTOR) 250 MG capsule Take 1 capsule (250 mg total) by mouth 2 (two) times daily.  60 capsule  1  . simvastatin (ZOCOR) 40 MG tablet Take 40 mg by mouth every evening.      . solifenacin (VESICARE) 5 MG tablet Take 5 mg by mouth daily.      . Tamsulosin HCl (FLOMAX) 0.4 MG CAPS Take 0.4 mg by mouth daily.      . valsartan-hydrochlorothiazide  (DIOVAN-HCT) 160-12.5 MG per tablet Take 1 tablet by mouth daily.      . vitamin B-12 (CYANOCOBALAMIN) 100 MCG tablet Take 300 mcg by mouth daily.        No current facility-administered medications for this visit.    Allergies as of 08/20/2013 - Review Complete 10/08/2012  Allergen Reaction Noted  . Asa [aspirin] Nausea Only 10/08/2012  . Vicodin [hydrocodone-acetaminophen] Nausea Only 10/08/2012    Vitals: BP 151/86  Pulse 86  Ht 5' 5.5" (1.664 m)  Wt 210 lb (95.255 kg)  BMI 34.40 kg/m2 Last Weight:  Wt Readings from Last 1 Encounters:  08/20/13 210 lb (95.255 kg)   Last Height:   Ht Readings from Last 1 Encounters:  08/20/13 5' 5.5" (1.664 m)     Physical exam: Exam: Gen: NAD, conversant Eyes: anicteric sclerae, moist conjunctivae HENT: Atraumatic, oropharynx clear Neck: Trachea midline; supple,  Lungs: CTA, no wheezing, rales, rhonic                          CV: RRR, no MRG Abdomen: Soft, non-tender;  Extremities: No peripheral edema  Skin: Normal temperature, no rash,  Psych: Appropriate affect, pleasant  Neuro: MS: AA&Ox3, appropriately interactive, normal affect   Speech: fluent w/o paraphasic error  Memory: good recent and remote recall  CN: PERRL, EOMI no nystagmus, visual fields full to finger count bilaterally, no visual neglect or visual field cut noted no ptosis, sensation intact to LT V1-V3 bilat, face symmetric, no weakness, hearing grossly intact, palate elevates symmetrically, shoulder shrug 5/5 bilat,  tongue protrudes midline, no fasiculations noted.  Motor: Mild decreased bulk left distal upper extremity Strength: Left upper extremity proximally and distally 4+ to 5-out of 5, right-sided strength 5 out of 5 both upper and lower extremity  Coord: Impaired finger tapping and hand opening closing left upper extremity   Reflexes: Symmetric bilateral upper extremity reflexes, right patellar and Achilles 2+, no clonus noted. Left patellar 3+,  left Achilles 4+ with 1-2 beats of nonsustained clonus , right downgoing plantar response left equivocal to upgoing   Sens: LT intact in all extremities, diminished pinprick temperature vibration and proprioception left lower extremity, no neglect noted, 2-point discrimination intact  Gait: Requires assistance to stand, mildly wide-based to conducting gait with left leg unable to stand on toes unable to tandem walls with Romberg but does not fall   Assessment:  After physical and neurologic examination, review of laboratory studies, imaging, neurophysiology testing and pre-existing records, assessment will be reviewed on the problem list.  Plan:  Treatment plan  and additional workup will be reviewed under Problem List.  1)Gait instability 2)Weakness 3)Sensory changes  79 year old gentleman presenting for initial evaluation of progressive left upper lung showed he weakness and sensory changes. Patient was valid by orthopedic surgery and referred to neurology for further workup. Patient's MRI imaging from outside showing multilevel degenerative changes but ortho feels there is no surgical indication at this time. History of predominantly left-sided symptoms raises concern for central process with history of acute onset raising concern for stroke. Will check brain MRI, EMG nerve conduction study. Will refer patient to physical therapy. Followup once MRI and EMG completed.

## 2013-08-20 NOTE — Patient Instructions (Signed)
Overall you are doing fairly well but I do want to suggest a few things today:   We will check a MRI of your brain to rule out a stroke. Someone will call you shortly to schedule this.  We will check an EMG/NCS, please schedule this upon checking out.  We will place a referral for physical therapy. Someone will call you to schedule this.   I would like to see you back once the MRI and EMG/NCS is completed, sooner if we need to. Please call us with any interim questions, concerns, problems, updates or refill requests.   Please also call us for any test results so we can go over those with you on the phone.  My clinical assistant and will answer any of your questions and relay your messages to me and also relay most of my messages to you.   Our phone number is [phone]. We also have an after hours call service for urgent matters and there is a physician on-call for urgent questions. For any emergencies you know to call 911 or go to the nearest emergency room

## 2013-08-26 ENCOUNTER — Telehealth: Payer: Self-pay | Admitting: Neurology

## 2013-08-28 ENCOUNTER — Ambulatory Visit
Admission: RE | Admit: 2013-08-28 | Discharge: 2013-08-28 | Disposition: A | Discharge status: Home / Self Care | Payer: Medicare Other | Source: Ambulatory Visit | Attending: Neurology | Admitting: Neurology

## 2013-08-28 DIAGNOSIS — R531 Weakness: Secondary | ICD-10-CM

## 2013-08-28 DIAGNOSIS — H905 Unspecified sensorineural hearing loss: Secondary | ICD-10-CM

## 2013-08-28 DIAGNOSIS — R209 Unspecified disturbances of skin sensation: Secondary | ICD-10-CM

## 2013-08-28 DIAGNOSIS — R2681 Unsteadiness on feet: Secondary | ICD-10-CM

## 2013-08-28 NOTE — Telephone Encounter (Signed)
Aurea Graff from [facility] called. Would like Dr. Hosie Poisson to review an abnormal MRI. She consulted with her doctor and the patient is being sent home. Please review today.

## 2013-09-02 ENCOUNTER — Encounter: Payer: Medicare Other | Admitting: Radiology

## 2013-09-02 ENCOUNTER — Encounter: Payer: Medicare Other | Admitting: Diagnostic Neuroimaging

## 2013-09-02 ENCOUNTER — Encounter: Payer: Self-pay | Admitting: Neurology

## 2013-09-02 ENCOUNTER — Ambulatory Visit (INDEPENDENT_AMBULATORY_CARE_PROVIDER_SITE_OTHER): Payer: Medicare Other | Admitting: Neurology

## 2013-09-02 VITALS — BP 128/80 | HR 85 | Ht 65.0 in | Wt 211.0 lb

## 2013-09-02 DIAGNOSIS — R5381 Other malaise: Secondary | ICD-10-CM

## 2013-09-02 DIAGNOSIS — R531 Weakness: Secondary | ICD-10-CM

## 2013-09-02 NOTE — Telephone Encounter (Signed)
Please call [name] and schedule him for a follow up this week. (He can be fit into any open slot). Thanks.

## 2013-09-02 NOTE — Progress Notes (Signed)
[facility]    Provider:  Dr Hosie Poisson Referring Provider: Duane Lope, MD Primary Care Physician:   Duane Lope, MD  CC:  Left sided weakness and sensory changes.   HPI:  [name]. is a 79 y.o. male here as a follow up from Dr. Tenny Craw for evaluation of left sided sensory changes and pain. Last visit was 08/20/2013 and he returns today for MRI results. MRI of the brain shows right frontoparietal mass with underlying mass effect. Patient reports that he continues to have difficulty with using the left side of his body, continued weakness, difficulty with fine motor tasks, difficulty walking, change in sensory modality. No overall worsening symptom since last visit   MRI brain official report: Abnormal MRI brain (without) demonstrating a right fronto-parietal, extra axial mass measuring 6.8x5.0x3.8cm (APxtransxSI). Mass effect upon the underlying brain parenchyma with distortion or the right lateral ventricle (best seen on coronal T2 views). No vasogenic brain edema. No midline shift. The lesion demonstrates punctate T2* hypointensities, may represent mineralization, hemosiderin or increased vascularity. Most likely represents a meningioma. Dural based metastatic lesion less likely. Consider following imaging with post-contrast views.    Initial visit 08/2013: In September patient began to develop left neck and arm pain and sensory loss, initially lasted around 10 minutes, resolved and returned. Wife noted he was dragging his left foot, would come and go and has then gotten progressively worse Now involves pain/sensory changes/weakness in LUE and LLE. Feels it is getting progressively worse, now having difficulty with fine motor tasks on the left hand, left leg and arm feels heavy. No associated change in vision or speech. No noted deficits with visual fields. No known history of trauma to head or neck. No change in bowel/bladder. No saddle anesthesia. Has some gait  instability, feels is partly due to weakness.  Prior to this was an active healthy person. No prior history of strokes. No history of HTN, CAD, HLD, DM, no cardiac arrythmia.  Reviewed MRI notes from  orthopedics, MR thoracic pertinent for T9-10 moderate canal and paracentral extrusion with mild canal stenosis and cord deformity T7-1 small bilateral posterolateral protrusion without cord deformity, cervical MRI pertinent for C5-6 mild canal stenosis and cord deformity, small to moderate left posterolateral extrusion superimposed on disc bulge with endplate spurring, greater mass effect than left C6 root than right  Review of Systems: Out of a complete 14 system review, the patient complains of only the following symptoms, and all other reviewed systems are negative. Positive for weakness sensory change  History   Social History  . Marital Status: Married    Spouse Name: Elease Hashimoto    Number of Children: 2  . Years of Education: 12   Occupational History  . Retired, Chartered certified accountant    Social History Main Topics  . Smoking status: Never Smoker   . Smokeless tobacco: Former Neurosurgeon    Types: Chew    Quit date: 10/08/1973  . Alcohol Use: No  . Drug Use: No  . Sexual Activity: Not on file   Other Topics Concern  . Not on file   Social History Narrative   Patient is married([name]) lives with wife in [place].  Normally independent of ADLs.   Patient is retired.   Patient has two children.   Patient has a high school education.   Right handed          Family History  Problem Relation Age of Onset  . Parkinsonism Mother   .  Dementia Father     Past Medical History  Diagnosis Date  . Anemia   . Prostate cancer   . Basal cell cancer   . Hypertension   . Hyperlipidemia   . Restless leg   . GERD (gastroesophageal reflux disease)     Past Surgical History  Procedure Laterality Date  . Joint replacement      right knee replacement  . Bilateral cateract surgery    .  Vasectomy    . Tonsillectomy    . Colonoscopy      colon polyp removed    Current Outpatient Prescriptions  Medication Sig Dispense Refill  . beta carotene w/minerals (OCUVITE) tablet Take 1 tablet by mouth daily.      . ferrous sulfate 325 (65 FE) MG EC tablet Take 325 mg by mouth daily with breakfast.      . Multiple Vitamins-Minerals (MULTIVITAMIN WITH MINERALS) tablet Take 1 tablet by mouth daily.      . Omega-3 Fatty Acids (FISH OIL) 1000 MG CAPS Take 1,000 mg by mouth every morning.      Marland Kitchen omeprazole (PRILOSEC) 20 MG capsule Take 20 mg by mouth daily.      Marland Kitchen rOPINIRole (REQUIP) 2 MG tablet Take 2 mg by mouth at bedtime.      . saccharomyces boulardii (FLORASTOR) 250 MG capsule Take 1 capsule (250 mg total) by mouth 2 (two) times daily.  60 capsule  1  . simvastatin (ZOCOR) 40 MG tablet Take 40 mg by mouth every evening.      . solifenacin (VESICARE) 5 MG tablet Take 5 mg by mouth daily.      . Tamsulosin HCl (FLOMAX) 0.4 MG CAPS Take 0.4 mg by mouth daily.      . valsartan-hydrochlorothiazide (DIOVAN-HCT) 160-12.5 MG per tablet Take 1 tablet by mouth daily.      . vitamin B-12 (CYANOCOBALAMIN) 100 MCG tablet Take 300 mcg by mouth daily.        No current facility-administered medications for this visit.    Allergies as of 09/02/2013 - Review Complete 08/20/2013  Allergen Reaction Noted  . Asa [aspirin] Nausea Only 10/08/2012  . Vicodin [hydrocodone-acetaminophen] Nausea Only 10/08/2012    Vitals: BP 128/80  Pulse 85  Ht 5\' 5"  (1.651 m)  Wt 211 lb (95.709 kg)  BMI 35.11 kg/m2 Last Weight:  Wt Readings from Last 1 Encounters:  09/02/13 211 lb (95.709 kg)   Last Height:   Ht Readings from Last 1 Encounters:  09/02/13 5\' 5"  (1.651 m)     Physical exam: Exam: Gen: NAD, conversant Eyes: anicteric sclerae, moist conjunctivae HENT: Atraumatic, oropharynx clear Neck: Trachea midline; supple,  Lungs: CTA, no wheezing, rales, rhonic                          CV: RRR, no  MRG Abdomen: Soft, non-tender;  Extremities: No peripheral edema  Skin: Normal temperature, no rash,  Psych: Appropriate affect, pleasant  Neuro: MS: AA&Ox3, appropriately interactive, normal affect   Speech: fluent w/o paraphasic error  Memory: good recent and remote recall  CN: PERRL, EOMI no nystagmus, visual fields full to finger count bilaterally, no visual neglect or visual field cut noted no ptosis, sensation intact to LT V1-V3 bilat, face symmetric, no weakness, hearing grossly intact, palate elevates symmetrically, shoulder shrug 5/5 bilat,  tongue protrudes midline, no fasiculations noted.  Motor: Mild decreased bulk left distal upper extremity Strength: Left upper extremity proximally and distally  4+ to 5-out of 5, right-sided strength 5 out of 5 both upper and lower extremity  Coord: Impaired finger tapping and hand opening closing left upper extremity   Reflexes: Symmetric bilateral upper extremity reflexes, right patellar and Achilles 2+, no clonus noted. Left patellar 3+, left Achilles 4+ with 1-2 beats of nonsustained clonus , right downgoing plantar response left equivocal to upgoing   Sens: LT intact in all extremities, diminished pinprick temperature vibration and proprioception left lower extremity, no neglect noted, 2-point discrimination intact  Gait: Requires assistance to stand, mildly wide-based to conducting gait with left leg unable to stand on toes unable to tandem walls with Romberg but does not fall   Assessment:  After physical and neurologic examination, review of laboratory studies, imaging, neurophysiology testing and pre-existing records, assessment will be reviewed on the problem list.  Plan:  Treatment plan and additional workup will be reviewed under Problem List.  1)Brain mass 2)Weakness 3)Sensory changes  79 year old gentleman presenting for follow up evaluation of progressive left upper and lower extremity weakness and sensory changes.  Patient was initially evaluated by orthopedic surgery and referred to neurology for further workup. Patient's cervical MRI imaging from outside showing multilevel degenerative changes but ortho feels there is no surgical indication at this time. MRI brain  shows a right-sided extra-axial mass likely consistent with a meningioma. Will get MRI imaging of the brain with contrast. Will refer patient to neurosurgery for further evaluation and possible treatment. Physical therapy referral.

## 2013-09-02 NOTE — Patient Instructions (Addendum)
Overall you are doing fairly well but I do want to suggest a few things today:   Your brain MRI showed a growth which is concerning for a meningioma. We will repeat the MRI of the brain with contrast to get a better picture of this growth.  I would like you to follow up with [facility] , we will place the request.   I will re-contact physical therapy to get you an appointment with them.   We will see you back as needed, sooner if we need to. Please call us with any interim questions, concerns, problems, updates or refill requests.   My clinical assistant and will answer any of your questions and relay your messages to me and also relay most of my messages to you.   Our phone number is [phone]. We also have an after hours call service for urgent matters and there is a physician on-call for urgent questions. For any emergencies you know to call 911 or go to the nearest emergency room

## 2013-09-02 NOTE — Telephone Encounter (Signed)
I called [name] and let him know, Dr. Hosie Poisson reviewed [name]'s MRI results. He would like [name] to be seen today at 12:30 p.m., instead of other appointments. We have rescheduled the appointment, if [name] can be here to see Dr. Hosie Poisson. [name] states he can be here.

## 2013-09-03 ENCOUNTER — Ambulatory Visit: Payer: Medicare Other | Attending: Neurology

## 2013-09-03 DIAGNOSIS — R279 Unspecified lack of coordination: Secondary | ICD-10-CM | POA: Insufficient documentation

## 2013-09-03 DIAGNOSIS — IMO0001 Reserved for inherently not codable concepts without codable children: Secondary | ICD-10-CM | POA: Insufficient documentation

## 2013-09-03 DIAGNOSIS — R269 Unspecified abnormalities of gait and mobility: Secondary | ICD-10-CM | POA: Insufficient documentation

## 2013-09-03 DIAGNOSIS — H905 Unspecified sensorineural hearing loss: Secondary | ICD-10-CM | POA: Insufficient documentation

## 2013-09-03 DIAGNOSIS — R5381 Other malaise: Secondary | ICD-10-CM | POA: Insufficient documentation

## 2013-09-03 DIAGNOSIS — M25669 Stiffness of unspecified knee, not elsewhere classified: Secondary | ICD-10-CM | POA: Insufficient documentation

## 2013-09-04 ENCOUNTER — Other Ambulatory Visit: Payer: Self-pay | Admitting: Neurosurgery

## 2013-09-04 DIAGNOSIS — D329 Benign neoplasm of meninges, unspecified: Secondary | ICD-10-CM

## 2013-09-05 ENCOUNTER — Other Ambulatory Visit: Payer: Self-pay | Admitting: Neurosurgery

## 2013-09-05 ENCOUNTER — Ambulatory Visit: Payer: Medicare Other | Admitting: Physical Therapy

## 2013-09-10 ENCOUNTER — Ambulatory Visit: Payer: Medicare Other

## 2013-09-13 ENCOUNTER — Ambulatory Visit
Admission: RE | Admit: 2013-09-13 | Discharge: 2013-09-13 | Disposition: A | Discharge status: Home / Self Care | Payer: Medicare Other | Source: Ambulatory Visit | Attending: Neurosurgery | Admitting: Neurosurgery

## 2013-09-13 DIAGNOSIS — D329 Benign neoplasm of meninges, unspecified: Secondary | ICD-10-CM

## 2013-09-13 MED ORDER — GADOBENATE DIMEGLUMINE 529 MG/ML IV SOLN
20.0000 mL | Freq: Once | INTRAVENOUS | Status: AC | PRN
  Administered 2013-09-13: 20 mL via INTRAVENOUS

## 2013-09-17 ENCOUNTER — Ambulatory Visit: Payer: Medicare Other

## 2013-09-18 ENCOUNTER — Ambulatory Visit: Payer: Medicare Other

## 2013-09-18 ENCOUNTER — Encounter (HOSPITAL_COMMUNITY)
Admission: RE | Admit: 2013-09-18 | Discharge: 2013-09-18 | Disposition: A | Discharge status: Home / Self Care | Payer: Medicare Other | Source: Ambulatory Visit | Attending: Neurosurgery | Admitting: Neurosurgery

## 2013-09-18 DIAGNOSIS — Z01812 Encounter for preprocedural laboratory examination: Secondary | ICD-10-CM | POA: Insufficient documentation

## 2013-09-18 DIAGNOSIS — Z01818 Encounter for other preprocedural examination: Secondary | ICD-10-CM | POA: Insufficient documentation

## 2013-09-18 LAB — CBC
MCH: 34.1 pg — ABNORMAL HIGH (ref 26.0–34.0)
MCHC: 34.9 g/dL (ref 30.0–36.0)
MCV: 97.7 fL (ref 78.0–100.0)
Platelets: 183 10*3/uL (ref 150–400)
RBC: 4.87 MIL/uL (ref 4.22–5.81)
RDW: 13.2 % (ref 11.5–15.5)

## 2013-09-18 LAB — BASIC METABOLIC PANEL
BUN: 24 mg/dL — ABNORMAL HIGH (ref 6–23)
CO2: 27 mEq/L (ref 19–32)
Calcium: 8.9 mg/dL (ref 8.4–10.5)
Creatinine, Ser: 1.26 mg/dL (ref 0.50–1.35)
Glucose, Bld: 87 mg/dL (ref 70–99)
Sodium: 138 mEq/L (ref 137–147)

## 2013-09-18 NOTE — Pre-Procedure Instructions (Signed)
Tell Rozelle.  09/18/2013   Your procedure is scheduled on:  Sep 26, 2012  Report to [facility] (Entrance A)  at 5:30 AM.  Call this number if you have problems the morning of surgery: [phone]   Remember:   Do not eat food or drink liquids after midnight.   Take these medicines the morning of surgery with A SIP OF WATER: omeprazole (PRILOSEC), Tamsulosin HCl (FLOMAX)    Do not wear jewelry.  Do not wear lotions, powders, or colognes. You may wear deodorant.  Men may shave face and neck only.  Do not bring valuables to the hospital.  [facility] is not responsible for any belongings or valuables.               Contacts, dentures or bridgework may not be worn into surgery.  Leave suitcase in the car. After surgery it may be brought to your room.  For patients admitted to the hospital, discharge time is determined by your treatment team.               Patients discharged the day of surgery will not be allowed to drive home.  Name and phone number of your driver:   Special Instructions: Shower using CHG 2 nights before surgery and the night before surgery.  If you shower the day of surgery use CHG.  Use special wash - you have one bottle of CHG for all showers.  You should use approximately 1/3 of the bottle for each shower.   Please read over the following fact sheets that you were given: Pain Booklet, Coughing and Deep Breathing, Blood Transfusion Information and Surgical Site Infection Prevention

## 2013-09-19 ENCOUNTER — Inpatient Hospital Stay (HOSPITAL_COMMUNITY)
Admission: EM | Admit: 2013-09-19 | Discharge: 2013-10-03 | DRG: 026 | Disposition: A | Discharge status: Rehabilitation Facility | Payer: Medicare Other | Attending: Neurosurgery | Admitting: Neurosurgery

## 2013-09-19 DIAGNOSIS — Y92009 Unspecified place in unspecified non-institutional (private) residence as the place of occurrence of the external cause: Secondary | ICD-10-CM

## 2013-09-19 DIAGNOSIS — N179 Acute kidney failure, unspecified: Secondary | ICD-10-CM

## 2013-09-19 DIAGNOSIS — I10 Essential (primary) hypertension: Secondary | ICD-10-CM | POA: Diagnosis present

## 2013-09-19 DIAGNOSIS — N138 Other obstructive and reflux uropathy: Secondary | ICD-10-CM | POA: Diagnosis present

## 2013-09-19 DIAGNOSIS — D62 Acute posthemorrhagic anemia: Secondary | ICD-10-CM | POA: Diagnosis not present

## 2013-09-19 DIAGNOSIS — J9819 Other pulmonary collapse: Secondary | ICD-10-CM | POA: Diagnosis not present

## 2013-09-19 DIAGNOSIS — N183 Chronic kidney disease, stage 3 unspecified: Secondary | ICD-10-CM | POA: Diagnosis present

## 2013-09-19 DIAGNOSIS — J9811 Atelectasis: Secondary | ICD-10-CM

## 2013-09-19 DIAGNOSIS — E785 Hyperlipidemia, unspecified: Secondary | ICD-10-CM | POA: Diagnosis present

## 2013-09-19 DIAGNOSIS — E860 Dehydration: Secondary | ICD-10-CM | POA: Diagnosis present

## 2013-09-19 DIAGNOSIS — R0789 Other chest pain: Secondary | ICD-10-CM

## 2013-09-19 DIAGNOSIS — W19XXXA Unspecified fall, initial encounter: Secondary | ICD-10-CM

## 2013-09-19 DIAGNOSIS — K219 Gastro-esophageal reflux disease without esophagitis: Secondary | ICD-10-CM | POA: Diagnosis present

## 2013-09-19 DIAGNOSIS — W19XXXD Unspecified fall, subsequent encounter: Secondary | ICD-10-CM

## 2013-09-19 DIAGNOSIS — I951 Orthostatic hypotension: Secondary | ICD-10-CM

## 2013-09-19 DIAGNOSIS — I129 Hypertensive chronic kidney disease with stage 1 through stage 4 chronic kidney disease, or unspecified chronic kidney disease: Secondary | ICD-10-CM | POA: Diagnosis present

## 2013-09-19 DIAGNOSIS — K5900 Constipation, unspecified: Secondary | ICD-10-CM | POA: Diagnosis present

## 2013-09-19 DIAGNOSIS — Z8546 Personal history of malignant neoplasm of prostate: Secondary | ICD-10-CM

## 2013-09-19 DIAGNOSIS — D320 Benign neoplasm of cerebral meninges: Principal | ICD-10-CM | POA: Diagnosis present

## 2013-09-19 DIAGNOSIS — R2681 Unsteadiness on feet: Secondary | ICD-10-CM

## 2013-09-19 DIAGNOSIS — R339 Retention of urine, unspecified: Secondary | ICD-10-CM

## 2013-09-19 DIAGNOSIS — D509 Iron deficiency anemia, unspecified: Secondary | ICD-10-CM | POA: Diagnosis present

## 2013-09-19 DIAGNOSIS — G2581 Restless legs syndrome: Secondary | ICD-10-CM | POA: Diagnosis present

## 2013-09-19 DIAGNOSIS — G9389 Other specified disorders of brain: Secondary | ICD-10-CM | POA: Diagnosis present

## 2013-09-19 DIAGNOSIS — G8190 Hemiplegia, unspecified affecting unspecified side: Secondary | ICD-10-CM | POA: Diagnosis present

## 2013-09-19 DIAGNOSIS — R42 Dizziness and giddiness: Secondary | ICD-10-CM

## 2013-09-19 DIAGNOSIS — Z96659 Presence of unspecified artificial knee joint: Secondary | ICD-10-CM

## 2013-09-19 DIAGNOSIS — Z87891 Personal history of nicotine dependence: Secondary | ICD-10-CM

## 2013-09-19 DIAGNOSIS — Z85828 Personal history of other malignant neoplasm of skin: Secondary | ICD-10-CM

## 2013-09-19 DIAGNOSIS — Z79899 Other long term (current) drug therapy: Secondary | ICD-10-CM

## 2013-09-19 DIAGNOSIS — N401 Enlarged prostate with lower urinary tract symptoms: Secondary | ICD-10-CM | POA: Diagnosis present

## 2013-09-19 HISTORY — DX: Neoplasm of unspecified behavior of brain: D49.6

## 2013-09-19 NOTE — ED Notes (Signed)
Pt arrives via EMS from home. Pt c/o loosing balance and falling on floor. PT states he fell onto his fist and left upper rib area. Pt experiencing pain in that location. No LOC, denies hitting head. Pt has left sided weakness due to brain tumor. Surgery scheduled for 09/27/13. CBG 95. 152/90 p 88 rr 18 96% RA.

## 2013-09-20 ENCOUNTER — Other Ambulatory Visit: Payer: Self-pay

## 2013-09-20 ENCOUNTER — Ambulatory Visit: Payer: Medicare Other | Admitting: Physical Therapy

## 2013-09-20 ENCOUNTER — Encounter (HOSPITAL_COMMUNITY): Payer: Self-pay | Admitting: Emergency Medicine

## 2013-09-20 ENCOUNTER — Emergency Department (HOSPITAL_COMMUNITY): Payer: Medicare Other

## 2013-09-20 DIAGNOSIS — I951 Orthostatic hypotension: Secondary | ICD-10-CM | POA: Diagnosis present

## 2013-09-20 DIAGNOSIS — G9389 Other specified disorders of brain: Secondary | ICD-10-CM | POA: Diagnosis present

## 2013-09-20 DIAGNOSIS — E860 Dehydration: Secondary | ICD-10-CM | POA: Diagnosis present

## 2013-09-20 DIAGNOSIS — N183 Chronic kidney disease, stage 3 unspecified: Secondary | ICD-10-CM | POA: Diagnosis present

## 2013-09-20 DIAGNOSIS — W19XXXA Unspecified fall, initial encounter: Secondary | ICD-10-CM

## 2013-09-20 DIAGNOSIS — Y92009 Unspecified place in unspecified non-institutional (private) residence as the place of occurrence of the external cause: Secondary | ICD-10-CM

## 2013-09-20 DIAGNOSIS — R42 Dizziness and giddiness: Secondary | ICD-10-CM

## 2013-09-20 DIAGNOSIS — N179 Acute kidney failure, unspecified: Secondary | ICD-10-CM

## 2013-09-20 LAB — CBC
HEMATOCRIT: 43.3 % (ref 39.0–52.0)
Hemoglobin: 14.8 g/dL (ref 13.0–17.0)
MCH: 33.3 pg (ref 26.0–34.0)
MCHC: 34.2 g/dL (ref 30.0–36.0)
MCV: 97.3 fL (ref 78.0–100.0)
Platelets: 185 10*3/uL (ref 150–400)
RBC: 4.45 MIL/uL (ref 4.22–5.81)
RDW: 13.3 % (ref 11.5–15.5)
WBC: 11.6 10*3/uL — ABNORMAL HIGH (ref 4.0–10.5)

## 2013-09-20 LAB — BASIC METABOLIC PANEL
BUN: 27 mg/dL — ABNORMAL HIGH (ref 6–23)
BUN: 25 mg/dL — AB (ref 6–23)
CALCIUM: 9.4 mg/dL (ref 8.4–10.5)
CHLORIDE: 102 meq/L (ref 96–112)
CO2: 27 mEq/L (ref 19–32)
CO2: 26 meq/L (ref 19–32)
Calcium: 9.1 mg/dL (ref 8.4–10.5)
Chloride: 100 mEq/L (ref 96–112)
Creatinine, Ser: 1.23 mg/dL (ref 0.50–1.35)
Creatinine, Ser: 1.27 mg/dL (ref 0.50–1.35)
GFR calc Af Amer: 60 mL/min — ABNORMAL LOW (ref >90)
GFR, EST AFRICAN AMERICAN: 62 mL/min — AB (ref >90)
GFR, EST NON AFRICAN AMERICAN: 54 mL/min — AB (ref >90)
GFR, EST NON AFRICAN AMERICAN: 52 mL/min — AB (ref >90)
GLUCOSE: 96 mg/dL (ref 70–99)
Glucose, Bld: 105 mg/dL — ABNORMAL HIGH (ref 70–99)
POTASSIUM: 4.2 meq/L (ref 3.7–5.3)
POTASSIUM: 4.5 meq/L (ref 3.7–5.3)
SODIUM: 141 meq/L (ref 137–147)
Sodium: 139 mEq/L (ref 137–147)

## 2013-09-20 LAB — URINALYSIS, ROUTINE W REFLEX MICROSCOPIC
Bilirubin Urine: NEGATIVE
Glucose, UA: NEGATIVE mg/dL
HGB URINE DIPSTICK: NEGATIVE
Ketones, ur: NEGATIVE mg/dL
Leukocytes, UA: NEGATIVE
NITRITE: NEGATIVE
Protein, ur: NEGATIVE mg/dL
SPECIFIC GRAVITY, URINE: 1.021 (ref 1.005–1.030)
Urobilinogen, UA: 1 mg/dL (ref 0.0–1.0)
pH: 6.5 (ref 5.0–8.0)

## 2013-09-20 LAB — CBC WITH DIFFERENTIAL/PLATELET
BASOS ABS: 0 10*3/uL (ref 0.0–0.1)
BASOS PCT: 0 % (ref 0–1)
EOS ABS: 0.1 10*3/uL (ref 0.0–0.7)
Eosinophils Relative: 1 % (ref 0–5)
HCT: 45.2 % (ref 39.0–52.0)
HEMOGLOBIN: 15.6 g/dL (ref 13.0–17.0)
Lymphocytes Relative: 11 % — ABNORMAL LOW (ref 12–46)
Lymphs Abs: 1.3 10*3/uL (ref 0.7–4.0)
MCH: 33.3 pg (ref 26.0–34.0)
MCHC: 34.5 g/dL (ref 30.0–36.0)
MCV: 96.4 fL (ref 78.0–100.0)
MONOS PCT: 7 % (ref 3–12)
Monocytes Absolute: 0.8 10*3/uL (ref 0.1–1.0)
NEUTROS ABS: 9.8 10*3/uL — AB (ref 1.7–7.7)
NEUTROS PCT: 82 % — AB (ref 43–77)
Platelets: 199 10*3/uL (ref 150–400)
RBC: 4.69 MIL/uL (ref 4.22–5.81)
RDW: 13.1 % (ref 11.5–15.5)
WBC: 12 10*3/uL — ABNORMAL HIGH (ref 4.0–10.5)

## 2013-09-20 LAB — TSH: TSH: 2.508 u[IU]/mL (ref 0.350–4.500)

## 2013-09-20 MED ORDER — FERROUS SULFATE 325 (65 FE) MG PO TABS
325.0000 mg | ORAL_TABLET | Freq: Every day | ORAL | Status: DC
  Administered 2013-09-20 – 2013-10-03 (×13): 325 mg via ORAL
  Filled 2013-09-20 (×16): qty 1

## 2013-09-20 MED ORDER — ONDANSETRON HCL 4 MG/2ML IJ SOLN: 4.0000 mg | Freq: Four times a day (QID) | INTRAMUSCULAR | Status: DC | PRN

## 2013-09-20 MED ORDER — ENOXAPARIN SODIUM 40 MG/0.4ML ~~LOC~~ SOLN
40.0000 mg | Freq: Every day | SUBCUTANEOUS | Status: DC
  Administered 2013-09-20 – 2013-09-25 (×6): 40 mg via SUBCUTANEOUS
  Filled 2013-09-20 (×7): qty 0.4

## 2013-09-20 MED ORDER — ONDANSETRON HCL 4 MG PO TABS: 4.0000 mg | ORAL_TABLET | Freq: Four times a day (QID) | ORAL | Status: DC | PRN

## 2013-09-20 MED ORDER — ROPINIROLE HCL 1 MG PO TABS
1.0000 mg | ORAL_TABLET | Freq: Every day | ORAL | Status: DC
  Administered 2013-09-20 – 2013-10-03 (×13): 1 mg via ORAL
  Filled 2013-09-20 (×14): qty 1

## 2013-09-20 MED ORDER — OXYCODONE-ACETAMINOPHEN 5-325 MG PO TABS
1.0000 | ORAL_TABLET | Freq: Once | ORAL | Status: AC
  Administered 2013-09-20: 1 via ORAL

## 2013-09-20 MED ORDER — TRAMADOL HCL 50 MG PO TABS
50.0000 mg | ORAL_TABLET | Freq: Four times a day (QID) | ORAL | Status: DC | PRN
  Administered 2013-09-20 – 2013-09-27 (×7): 50 mg via ORAL
  Filled 2013-09-20 (×7): qty 1

## 2013-09-20 MED ORDER — VALSARTAN-HYDROCHLOROTHIAZIDE 160-12.5 MG PO TABS: 1.0000 | ORAL_TABLET | Freq: Every day | ORAL | Status: DC

## 2013-09-20 MED ORDER — SACCHAROMYCES BOULARDII 250 MG PO CAPS
250.0000 mg | ORAL_CAPSULE | Freq: Every day | ORAL | Status: DC
  Administered 2013-09-20 – 2013-10-03 (×13): 250 mg via ORAL
  Filled 2013-09-20 (×14): qty 1

## 2013-09-20 MED ORDER — ONDANSETRON 4 MG PO TBDP
8.0000 mg | ORAL_TABLET | Freq: Once | ORAL | Status: AC
  Administered 2013-09-20: 8 mg via ORAL

## 2013-09-20 MED ORDER — OCUVITE PO TABS
1.0000 | ORAL_TABLET | Freq: Every day | ORAL | Status: DC
  Administered 2013-09-20 – 2013-10-03 (×13): 1 via ORAL
  Filled 2013-09-20 (×14): qty 1

## 2013-09-20 MED ORDER — ACETAMINOPHEN 325 MG PO TABS
650.0000 mg | ORAL_TABLET | ORAL | Status: DC | PRN
  Administered 2013-09-26: 650 mg via ORAL
  Filled 2013-09-20: qty 2

## 2013-09-20 MED ORDER — SIMVASTATIN 40 MG PO TABS
40.0000 mg | ORAL_TABLET | Freq: Every evening | ORAL | Status: DC
  Administered 2013-09-20 – 2013-10-02 (×12): 40 mg via ORAL
  Filled 2013-09-20 (×15): qty 1

## 2013-09-20 MED ORDER — IRBESARTAN 150 MG PO TABS
150.0000 mg | ORAL_TABLET | Freq: Every day | ORAL | Status: DC
  Administered 2013-09-20: 11:00:00 150 mg via ORAL
  Filled 2013-09-20: qty 1

## 2013-09-20 MED ORDER — SODIUM CHLORIDE 0.9 % IV BOLUS (SEPSIS)
500.0000 mL | Freq: Once | INTRAVENOUS | Status: AC
  Administered 2013-09-20: 500 mL via INTRAVENOUS

## 2013-09-20 MED ORDER — PANTOPRAZOLE SODIUM 40 MG PO TBEC
40.0000 mg | DELAYED_RELEASE_TABLET | Freq: Every day | ORAL | Status: DC
  Administered 2013-09-20 – 2013-10-03 (×13): 40 mg via ORAL
  Filled 2013-09-20 (×12): qty 1

## 2013-09-20 MED ORDER — SODIUM CHLORIDE 0.9 % IV SOLN
INTRAVENOUS | Status: DC
  Administered 2013-09-20: 10:00:00 50 mL/h via INTRAVENOUS

## 2013-09-20 MED ORDER — ROPINIROLE HCL 1 MG PO TABS
2.0000 mg | ORAL_TABLET | Freq: Every day | ORAL | Status: DC
  Filled 2013-09-20: qty 2

## 2013-09-20 MED ORDER — ONDANSETRON HCL 4 MG PO TABS
4.0000 mg | ORAL_TABLET | ORAL | Status: DC | PRN
  Administered 2013-09-20 – 2013-09-22 (×6): 4 mg via ORAL
  Filled 2013-09-20 (×6): qty 1

## 2013-09-20 MED ORDER — SODIUM CHLORIDE 0.9 % IV SOLN
INTRAVENOUS | Status: DC
  Administered 2013-09-20: 75 mL/h via INTRAVENOUS
  Administered 2013-09-21 – 2013-09-24 (×4): via INTRAVENOUS

## 2013-09-20 MED ORDER — ROPINIROLE HCL 1 MG PO TABS
1.0000 mg | ORAL_TABLET | Freq: Every day | ORAL | Status: DC
  Administered 2013-09-20 – 2013-10-02 (×13): 1 mg via ORAL
  Filled 2013-09-20 (×16): qty 1

## 2013-09-20 MED ORDER — OXYCODONE HCL 5 MG PO TABS: 5.0000 mg | ORAL_TABLET | ORAL | Status: DC | PRN

## 2013-09-20 MED ORDER — ONDANSETRON 4 MG PO TBDP
ORAL_TABLET | ORAL | Status: AC
  Filled 2013-09-20: qty 2

## 2013-09-20 MED ORDER — HYDROCHLOROTHIAZIDE 12.5 MG PO CAPS
12.5000 mg | ORAL_CAPSULE | Freq: Every day | ORAL | Status: DC
  Filled 2013-09-20: qty 1

## 2013-09-20 MED ORDER — OXYCODONE-ACETAMINOPHEN 5-325 MG PO TABS
ORAL_TABLET | ORAL | Status: AC
  Filled 2013-09-20: qty 1

## 2013-09-20 MED ORDER — OXYCODONE HCL 5 MG PO TABS
5.0000 mg | ORAL_TABLET | ORAL | Status: DC | PRN
  Administered 2013-09-20 – 2013-09-29 (×21): 5 mg via ORAL
  Filled 2013-09-20 (×22): qty 1

## 2013-09-20 MED ORDER — DARIFENACIN HYDROBROMIDE ER 7.5 MG PO TB24
7.5000 mg | ORAL_TABLET | Freq: Every day | ORAL | Status: DC
  Administered 2013-09-20 – 2013-10-03 (×13): 7.5 mg via ORAL
  Filled 2013-09-20 (×14): qty 1

## 2013-09-20 MED ORDER — TAMSULOSIN HCL 0.4 MG PO CAPS
0.4000 mg | ORAL_CAPSULE | Freq: Every day | ORAL | Status: DC
  Administered 2013-09-20: 0.4 mg via ORAL
  Filled 2013-09-20: qty 1

## 2013-09-20 NOTE — ED Provider Notes (Signed)
CSN: [id number]     Arrival date & time 09/19/13  2357 History   First MD Initiated Contact with Patient 09/20/13 0119     Chief Complaint  Patient presents with  . Fall   Patient is a 80 y.o. male presenting with fall. The history is provided by the patient and a significant other.  Fall This is a new problem. Episode onset: just prior to arrival. The problem occurs constantly. The problem has been gradually improving. Associated symptoms include chest pain. Pertinent negatives include no abdominal pain, no headaches and no shortness of breath. Nothing aggravates the symptoms. Nothing relieves the symptoms.  pt presents s/p fall He reports he lost his balance and fell.  While falling, his right arm got pressed against ribs and he now has pain in ribs.  No LOC.  No Head injury.  No HA.  No neck or back pain.  No abd pain    Past Medical History  Diagnosis Date  . Anemia   . Prostate cancer   . Basal cell cancer   . Hypertension   . Hyperlipidemia   . Restless leg   . GERD (gastroesophageal reflux disease)   . Brain tumor    Past Surgical History  Procedure Laterality Date  . Joint replacement      right knee replacement  . Bilateral cateract surgery    . Vasectomy    . Tonsillectomy    . Colonoscopy      colon polyp removed   Family History  Problem Relation Age of Onset  . Parkinsonism Mother   . Dementia Father    History  Substance Use Topics  . Smoking status: Never Smoker   . Smokeless tobacco: Former Systems developer    Types: [place] date: 10/08/1973  . Alcohol Use: No    Review of Systems  Respiratory: Negative for shortness of breath.   Cardiovascular: Positive for chest pain.  Gastrointestinal: Negative for abdominal pain.  Neurological: Negative for headaches.  All other systems reviewed and are negative.    Allergies  Asa and Vicodin  Home Medications   Current Outpatient Rx  Name  Route  Sig  Dispense  Refill  . beta carotene w/minerals (OCUVITE)  tablet   Oral   Take 1 tablet by mouth daily.         . ferrous sulfate 325 (65 FE) MG EC tablet   Oral   Take 325 mg by mouth daily with breakfast.         . Omega-3 Fatty Acids (FISH OIL) 1000 MG CAPS   Oral   Take 1,000 mg by mouth every morning.         Marland Kitchen omeprazole (PRILOSEC) 20 MG capsule   Oral   Take 20 mg by mouth daily.         Marland Kitchen rOPINIRole (REQUIP) 2 MG tablet   Oral   Take 2 mg by mouth at bedtime.         . saccharomyces boulardii (FLORASTOR) 250 MG capsule   Oral   Take 250 mg by mouth daily.         . simvastatin (ZOCOR) 40 MG tablet   Oral   Take 40 mg by mouth every evening.         . solifenacin (VESICARE) 5 MG tablet   Oral   Take 5 mg by mouth daily.         . Tamsulosin HCl (FLOMAX) 0.4 MG CAPS  Oral   Take 0.4 mg by mouth daily.         . valsartan-hydrochlorothiazide (DIOVAN-HCT) 160-12.5 MG per tablet   Oral   Take 1 tablet by mouth daily.         . vitamin B-12 (CYANOCOBALAMIN) 100 MCG tablet   Oral   Take 300 mcg by mouth daily.           BP 121/65  Pulse 77  Temp(Src) 98.4 F (36.9 C) (Oral)  Resp 18  SpO2 94% Physical Exam CONSTITUTIONAL: Well developed/well nourished HEAD: Normocephalic/atraumatic EYES: EOMI/PERRL ENMT: Mucous membranes moist, No evidence of facial/nasal trauma NECK: supple no meningeal signs SPINE:entire spine nontender, No bruising/crepitance/stepoffs noted to spine CV: S1/S2 noted, no murmurs/rubs/gallops noted Chest - diffuse tenderness along left chest wall, no crepitance or bruising noted LUNGS: Lungs are clear to auscultation bilaterally, no apparent distress ABDOMEN: soft, nontender, no rebound or guarding GU:no cva tenderness NEURO: Pt is awake/alert, moves all extremitiesx4, GCS 15 He does have difficulty with movement of left LE (reports this is not new today) EXTREMITIES: pulses normal, full ROM, All extremities/joints palpated/ranged and nontender No tenderness to ROM of  either hip SKIN: warm, color normal PSYCH: no abnormalities of mood noted  ED Course  Procedures (including critical care time) Labs Review Labs Reviewed  CBC WITH DIFFERENTIAL - Abnormal; Notable for the following:    WBC 12.0 (*)    Neutrophils Relative % 82 (*)    Neutro Abs 9.8 (*)    Lymphocytes Relative 11 (*)    All other components within normal limits  URINALYSIS, ROUTINE W REFLEX MICROSCOPIC - Abnormal; Notable for the following:    APPearance CLOUDY (*)    All other components within normal limits  BASIC METABOLIC PANEL   Imaging Review Dg Chest 2 View  09/18/2013   CLINICAL DATA:  Preop for brain tumor  EXAM: CHEST  2 VIEW  COMPARISON:  10/08/2012  FINDINGS: Cardiomediastinal silhouette is stable. No acute infiltrate or pleural effusion. No pulmonary edema. Large hiatal hernia with air-fluid level measures 10.4 x 10.1 cm.  IMPRESSION: No active cardiopulmonary disease. Large hiatal hernia with air-fluid level.   Electronically Signed   By: Lahoma Crocker M.D.   On: 09/18/2013 13:17   Dg Ribs Unilateral W/chest Left  09/20/2013   CLINICAL DATA:  History of fall complaining of left anterior rib pain.  EXAM: LEFT RIBS AND CHEST - 3+ VIEW  COMPARISON:  Chest x-ray 09/18/2013.  FINDINGS: Low lung volumes. No consolidative airspace disease. No pleural effusions. No evidence of pulmonary edema. Heart size is normal. Large hiatal hernia again noted. Upper mediastinal contours are within normal limits.  Dedicated views of the left ribs demonstrate no definite acute displaced left-sided rib fractures. Old healed fractures of the posterolateral aspects of the left 9th, 10th and 11th ribs are incidentally noted.  IMPRESSION: 1. No acute displaced left-sided rib fractures. 2. Low lung volumes without radiographic evidence of acute cardiopulmonary disease. 3. Large hiatal hernia again noted.   Electronically Signed   By: Vinnie Langton M.D.   On: 09/20/2013 01:27    EKG Interpretation    None       2:45 AM Pt tells me he lost his balance and injured his left chest No evidence of rib fracture, no abd tenderness Denies head injury On further discussion, he reports his balance has been worsening for months, worse in past week.  He also reports weakness in left leg, and reports this is  due to "brain tumor"  He reports now he can barely walk around his house He reports he is scheduled to have tumor removed soon On review of chart , he has h/o large meningioma with craniotomy planned He has no acute weakness on this visit, but report recent worsening balance issues and weakness in left LE.  Will need admission due to symptoms and high fall risk  3:56 AM D/w dr Doyle Askew, will admit to med-surg  Pt stable at this time CT head imaging deferred as no signs of head trauma and has had progressive symptoms with known h/o meningioma   MDM  No diagnosis found. Nursing notes including past medical history and social history reviewed and considered in documentation Labs/vital reviewed and considered xrays reviewed and considered    Date: 09/20/2013  Rate: 78  Rhythm: normal sinus rhythm  QRS Axis: normal  Intervals: normal  ST/T Wave abnormalities: nonspecific ST changes  Conduction Disutrbances:none      Sharyon Cable, MD 09/20/13 [phone]

## 2013-09-20 NOTE — Progress Notes (Signed)
[facility] Progress Note [place] TEAM [address]. HWE:[id number] DOB: 12/06/1930 DOA: 09/19/2013 PCP:  Melinda Crutch, MD  Brief narrative:  80 year old male patient with history of hypertension and known gait disturbance. Has been following with a neurologist. Recent MRI revealed large meningioma with a craniotomy planned for 09/27/2013. Patient presented to the emergency department because of a fall at home which appear to be mechanical in nature. Prior to this fall patient also endorsed symptoms which may be orthostatic in nature stating he would get a little dizzy and fall backwards onto the bed when he attempted to stand. Patient is noted to be on antihypertensive medications as well as diuretics at home. When the patient fell he hit his anterior chest wall which was very tender upon evaluation but x-ray revealed no evidence of acute rib fractures although he does have some healing prior rib fractures. EKG was unrevealing in the emergency department.  Assessment/Plan: Active Problems:   Gait instability/Dizziness -likely multifactorial due to mass and suspected orthostasis -PT/OT eval pending once pain better controlled    Fall at home/chest wall pain -allergy to AS so will use Tylenol/Ultram/Oxycodone -Apply K pad    Dehydration/Orthostasis -normotensive earlier with ? orthostasis but supine to sitting OVS inconclusive -Maxide held -later after BP meds SBP down to 85 so Avapro and Flomax (watch for urinary retention) held -incr IVFs to 75/hr and give 500 cc NS bolus    CKD (chronic kidney disease), stage III     Hypertension    Restless leg syndrome -meds adjusted to usual home regimen    Brain mass   DVT prophylaxis: Lovenox Code Status: Full Family Communication: No family at bedside Disposition Plan/Expected LOS: Remain inpatient until pain better controlled and symptoms of orthostasis resolved Isolation: None Nutritional Status:  Stable  Consultants: None  Procedures: None  Antibiotics: None  HPI/Subjective: Patient alert and primarily complaining of right-sided chest discomfort and tenderness upon palpation of chest wall that radiates to sternum. No crepitus palpable on exam. No shortness of breath with this pain appeared  Objective: Blood pressure 80/50, pulse 87, temperature 98 F (36.7 C), temperature source Oral, resp. rate 18, height 5\' 9"  (1.753 m), weight 208 lb (94.348 kg), SpO2 94.00%.  Intake/Output Summary (Last 24 hours) at 09/20/13 1506 Last data filed at 09/20/13 1502  Gross per 24 hour  Intake    360 ml  Output   1150 ml  Net   -790 ml     Exam: Follow up exam completed noting patient admitted at 5:46 AM today  Scheduled Meds:  Scheduled Meds: . beta carotene w/minerals  1 tablet Oral Daily  . darifenacin  7.5 mg Oral Daily  . enoxaparin (LOVENOX) injection  40 mg Subcutaneous Daily  . ferrous sulfate  325 mg Oral Q breakfast  . pantoprazole  40 mg Oral Daily  . rOPINIRole  1 mg Oral Q1500   And  . rOPINIRole  1 mg Oral QHS  . saccharomyces boulardii  250 mg Oral Daily  . simvastatin  40 mg Oral QPM   Continuous Infusions: . sodium chloride      **Reviewed in detail by the Attending Physician  Data Reviewed: Basic Metabolic Panel:  Recent Labs Lab 09/18/13 1128 09/20/13 0207 09/20/13 0619  NA 138 139 141  K 4.4 4.2 4.5  CL 99 100 102  CO2 27 27 26   GLUCOSE 87 105* 96  BUN 24* 27* 25*  CREATININE 1.26 1.23 1.27  CALCIUM 8.9 9.4 9.1   Liver Function Tests: No results found for this basename: AST, ALT, ALKPHOS, BILITOT, PROT, ALBUMIN,  in the last 168 hours No results found for this basename: LIPASE, AMYLASE,  in the last 168 hours No results found for this basename: AMMONIA,  in the last 168 hours CBC:  Recent Labs Lab 09/18/13 1128 09/20/13 0207 09/20/13 0619  WBC 8.7 12.0* 11.6*  NEUTROABS  --  9.8*  --   HGB 16.6 15.6 14.8  HCT 47.6 45.2 43.3   MCV 97.7 96.4 97.3  PLT 183 199 185   Cardiac Enzymes: No results found for this basename: CKTOTAL, CKMB, CKMBINDEX, TROPONINI,  in the last 168 hours BNP (last 3 results) No results found for this basename: PROBNP,  in the last 8760 hours CBG: No results found for this basename: GLUCAP,  in the last 168 hours  Recent Results (from the past 240 hour(s))  SURGICAL PCR SCREEN     Status: None   Collection Time    09/18/13 11:31 AM      Result Value Range Status   MRSA, PCR NEGATIVE  NEGATIVE Final   Staphylococcus aureus NEGATIVE  NEGATIVE Final   Comment:            The Xpert SA Assay (FDA     approved for NASAL specimens     in patients over 13 years of age),     is one component of     a comprehensive surveillance     program.  Test performance has     been validated by [employer] for patients greater     than or equal to 28 year old.     It is not intended     to diagnose infection nor to     guide or monitor treatment.     Studies:  Recent x-ray studies have been reviewed in detail by the Attending Physician     Erin Hearing, ANP [facility] Office  [phone] Pager [phone]  **If unable to reach the above provider after paging please contact the [place] @ [phone]  On-Call/Text Page:      [email or web address]      password [email or web address]  If 7PM-7AM, please contact night-coverage [email or web address] Password [email or web address] 09/20/2013, 3:06 PM   LOS: 1 day   Addendum: I have personally seen and evaluated patient and agree with the above findings.  Patient complains of thorasic wall pain over area that he injured from fall. He reports progressively worsening balance issues over the past several weeks. Was seen by PT who recommended CIR. Otherwise labs were reviewed, patient remains stable.

## 2013-09-20 NOTE — Evaluation (Signed)
Physical Therapy Evaluation Patient Details Name: [name]. MRN: [id number] DOB: 04-29-1931 Today's Date: 09/20/2013 Time: 0867-6195 PT Time Calculation (min): 31 min  PT Assessment / Plan / Recommendation History of Present Illness  Pt is 80 y.o. male with PMH of GERD, HTN, hyperlipidemia, large meningioma with planned craniotomy in the near future who presets to [facility] ED after an episode of fall at home several hours PTA. Pt explains he has been progressively weaker and has had frequent falls, today he fell on his chest. His pain is no in the anterior chest area, throbbing and persistent, 5/10 in severity, non radiating, worse with movement. He denies shortness of breath, no specific abdominal or urinary concerns. No LOC, no headaches, no visual changes.    Clinical Impression  Pt presents with increased flexor tone left upper and increased extensor tone left lower extremity.  He is very sore where he fell and hit his chest and it is affecting his tolerance of mobility.  The steady was used to get him safely from the bed to the recliner chair today.  He is at a high fall risk and his wife cannot physically handle him at this point with his issues with strength, coordination, and tone on his left side.  He would be an excellent inpatient rehab candidate.   PT to follow acutely for deficits listed below.       PT Assessment  Patient needs continued PT services    Follow Up Recommendations  CIR    Does the patient have the potential to tolerate intense rehabilitation     Yes  Barriers to Discharge Decreased caregiver support Wife is unable to handle him in his current physical state    Equipment Recommendations  Wheelchair (measurements PT);Wheelchair cushion (measurements PT);Hospital bed    Recommendations for Other Services Rehab consult   Frequency Min 3X/week    Precautions / Restrictions Precautions Precautions: Fall Precaution Comments: left leg and arm weakness,  dyscoordination, and increased tone.   Restrictions Weight Bearing Restrictions: No   Pertinent Vitals/Pain See vitals flow sheet.       Mobility  Bed Mobility Bed Mobility: Supine to Sit;Sitting - Scoot to Edge of Bed Supine to Sit: With rails;HOB elevated;2: Max assist Sitting - Scoot to [employer] of Bed: 3: Mod assist;With rail Sit to Supine: 2: Max assist;HOB flat Scooting to HOB: 2: Max assist Details for Bed Mobility Assistance: Assit needed to progress left leg to EOB.  Assist at trunk to get to sitting.  Pt pulling with right arm against bedrail, but unable to muscle up with just this arm on his own.  Once sitting mod assist to support trunk to get scooted to EOB using drawpad to progress left hip out.  Transfers Transfers: Sit to Stand;Stand to Sit Sit to Stand: With upper extremity assist;With armrests;From bed;3: Mod assist Stand to Sit: 3: Mod assist;Without upper extremity assist;With armrests;To chair/3-in-1 Transfer via Lift Equipment: Stedy Details for Transfer Assistance: PT used the Steady to help increase safety with transfer to the chair.  Pt was able to use the bar on the steady to get to standing with the support of the front shin bar to block knees.  Hand over hand assist needed to position and maintain his left hand on the stedy during the transfer to the chair.  Pt's left leg in standing with increased extension tone and pt unable to reposition it on the stedy to get it situated more squarely under his body.  Ambulation/Gait Ambulation/Gait Assistance: Not tested (comment) (not safe without two person assit. )        PT Diagnosis: Difficulty walking;Abnormality of gait;Generalized weakness;Acute pain;Hemiplegia non-dominant side  PT Problem List: Decreased strength;Decreased activity tolerance;Decreased balance;Decreased mobility;Decreased range of motion;Decreased coordination;Decreased knowledge of use of DME;Pain;Impaired tone PT Treatment Interventions: DME  instruction;Gait training;Stair training;Functional mobility training;Therapeutic activities;Therapeutic exercise;Balance training;Neuromuscular re-education;Patient/family education;Wheelchair mobility training;Modalities;Manual techniques     PT Goals(Current goals can be found in the care plan section) Acute Rehab PT Goals Patient Stated Goal: " to get my brain surgery over with and get on to rehab" PT Goal Formulation: With patient/family Time For Goal Achievement: 10/04/13 Potential to Achieve Goals: Good  Visit Information  Last PT Received On: 09/20/13 Assistance Needed: +2 History of Present Illness: Pt is 80 y.o. male with PMH of GERD, HTN, hyperlipidemia, large meningioma with planned craniotomy in the near future who presets to [facility] ED after an episode of fall at home several hours PTA. Pt explains he has been progressively weaker and has had frequent falls, today he fell on his chest. His pain is no in the anterior chest area, throbbing and persistent, 5/10 in severity, non radiating, worse with movement. He denies shortness of breath, no specific abdominal or urinary concerns. No LOC, no headaches, no visual changes.         Prior A[place]lia expects to be discharged to:: Private residence Living Arrangements: Spouse/significant other Available Help at Discharge: Family Type of Home: House Home Access: Stairs to enter Technical brewer of Steps: 4 Entrance Stairs-Rails: Left Home Layout: [place] - single point;Walker - 4 wheels;Grab bars - tub/shower Additional Comments: Pt states that his balance has been decreasing over the last 2 weeks Prior Function Level of Independence: Independent Comments: for the past week he has been trying to walk with a RW at home.   Communication Communication: No difficulties Dominant Hand: Right    Cognition  Cognition Arousal/Alertness: Awake/alert Behavior During Therapy: WFL  for tasks assessed/performed Overall Cognitive Status: Within Functional Limits for tasks assessed    Extremity/Trunk Assessment Upper Extremity Assessment Upper Extremity Assessment: Defer to OT evaluation LUE Deficits / Details: pain in chest area limits AROM of L UE. Pt reports he has been having difficulty with coordination, strength and ROM for a 4-5 weeks LUE: Unable to fully assess due to pain LUE Coordination: decreased fine motor;decreased gross motor Lower Extremity Assessment Lower Extremity Assessment: RLE deficits/detail;LLE deficits/detail RLE Deficits / Details: right leg WNL 5/5 per MMT.   LLE Deficits / Details: ankle trace, knee 2+/5 with slow response and increased extensor tone, hip 2+/5 LLE Coordination: decreased fine motor;decreased gross motor Cervical / Trunk Assessment Cervical / Trunk Assessment: Normal   Balance Balance Balance Assessed: Yes Static Sitting Balance Static Sitting - Balance Support: Feet supported;Right upper extremity supported Static Sitting - Level of Assistance: 5: Stand by assistance Dynamic Sitting Balance Dynamic Sitting - Balance Support: Left upper extremity supported;Feet unsupported;During functional activity Dynamic Sitting - Level of Assistance: 3: Mod assist Static Standing Balance Static Standing - Balance Support: Bilateral upper extremity supported Static Standing - Level of Assistance: 3: Mod assist  End of Session PT - End of Session Activity Tolerance: Patient limited by pain Patient left: in chair;with call bell/phone within reach;with family/visitor present    Wells Guiles B. Markeia Harkless, PT, DPT [phone]   09/20/2013, 1:16 PM

## 2013-09-20 NOTE — Progress Notes (Signed)
Rehab Admissions Coordinator Note:  Patient was screened by Retta Diones for appropriateness for an Inpatient Acute Rehab Consult.  At this time, we are recommending Inpatient Rehab consult.  Retta Diones 09/20/2013, 3:54 PM  I can be reached at [phone].

## 2013-09-20 NOTE — Evaluation (Addendum)
Occupational Therapy Evaluation Patient Details Name: [name]. MRN: [id number] DOB: 01/14/1931 Today's Date: 09/20/2013 Time: 4270-6237 OT Time Calculation (min): 38 min  OT Assessment / Plan / Recommendation History of present illness Pt is 80 y.o. male with PMH of GERD, HTN, hyperlipidemia, large meningioma with planned craniotomy in the near future who presets to [facility] ED after an episode of fall at home several hours PTA. Pt explains he has been progressively weaker and has had frequent falls, today he fell on his chest. His pain is no in the anterior chest area, throbbing and persistent, 5/10 in severity, non radiating, worse with movement. He denies shortness of breath, no specific abdominal or urinary concerns. No LOC, no headaches, no visual changes.     Clinical Impression   Pt demos decline in function with ADLs and ADL mobility safety with decreased strength, endurance and coordination/senstion in L UE. Pt would benefit from acute OT services to address impairments to increase level of function and safety. Pt concerned with returning home and his wife not being able to provide adequate/safe care for him on her own. Pt is agreeable to further rehab after acute care stay    OT Assessment  Patient needs continued OT Services    Follow Up Recommendations  CIR    Barriers to Discharge Decreased caregiver support Pt states that his wife would not be able to care for him at home by herself and that he cannot go home if he cannot walk and get into the house  Equipment Recommendations  None recommended by OT;Other (comment) (TBD at next venue of care)    Recommendations for Other Services    Frequency  Min 2X/week    Precautions / Restrictions Precautions Precautions: Fall Restrictions Weight Bearing Restrictions: No   Pertinent Vitals/Pain 2/10 chest area at rest, 8/10 with movement    ADL  Grooming: Performed;Wash/dry hands;Wash/dry face;Minimal assistance Where Assessed -  Grooming: Supported sitting (mod A for balance) Upper Body Bathing: Simulated;Moderate assistance Lower Body Bathing: Simulated;Maximal assistance;+1 Total assistance Upper Body Dressing: Performed;Moderate assistance Lower Body Dressing: +1 Total assistance Toilet Transfer: Performed;+1 Total assistance Toilet Transfer Method: Sit to stand;Stand pivot Toilet Transfer Equipment: Raised toilet seat with arms (or 3-in-1 over toilet) Toileting - Clothing Manipulation and Hygiene: +1 Total assistance Where Assessed - Toileting Clothing Manipulation and Hygiene: Standing Tub/Shower Transfer Method: Not assessed Equipment Used: Gait belt;Rolling walker Transfers/Ambulation Related to ADLs: cues for hand placement, technique. Pt states that he has diffuculty "feeling where his  L LE is on the floor" ADL Comments: requires extensive assist with ADLs due to decreased strength/balance and L UE ROM/coordination    OT Diagnosis: Generalized weakness;Acute pain  OT Problem List: Decreased strength;Decreased knowledge of use of DME or AE;Impaired tone;Decreased coordination;Decreased range of motion;Decreased activity tolerance;Impaired UE functional use;Impaired balance (sitting and/or standing);Pain OT Treatment Interventions: Self-care/ADL training;Therapeutic exercise;Patient/family education;Neuromuscular education;Balance training;Therapeutic activities;DME and/or AE instruction   OT Goals(Current goals can be found in the care plan section) Acute Rehab OT Goals Patient Stated Goal: " to get my brain surgery over with and get on to rehab" OT Goal Formulation: With patient Time For Goal Achievement: 09/27/13 Potential to Achieve Goals: Good ADL Goals Pt Will Perform Grooming: with min guard assist;with set-up;with supervision;sitting Pt Will Perform Upper Body Bathing: with min assist;sitting Pt Will Perform Lower Body Bathing: with mod assist;sitting/lateral leans Pt Will Transfer to Toilet:  with max assist;with mod assist;bedside commode Additional ADL Goal #1: Pt will participate in  L UE strengthening and coordination exercises to improve L UE function for increased ADLs Additional ADL Goal #2: Pt will complete bed mobility with mod A to sit EOB in prep for functional tasks and L UE exercises  Visit Information  Last OT Received On: 09/20/13 Assistance Needed: +2 History of Present Illness: Pt is 80 y.o. male with PMH of GERD, HTN, hyperlipidemia, large meningioma with planned craniotomy in the near future who presets to [facility] ED after an episode of fall at home several hours PTA. Pt explains he has been progressively weaker and has had frequent falls, today he fell on his chest. His pain is no in the anterior chest area, throbbing and persistent, 5/10 in severity, non radiating, worse with movement. He denies shortness of breath, no specific abdominal or urinary concerns. No LOC, no headaches, no visual changes.         Prior [place] expects to be discharged to:: Private residence Living Arrangements: Spouse/significant other Available Help at Discharge: Family Type of Home: House Home Access: Stairs to enter Technical brewer of Steps: 4 Entrance Stairs-Rails: Left Home Layout: [place] [address] - single point;Walker - 4 wheels;Grab bars - tub/shower Additional Comments: Pt states that his balance has been decreasing over the last 2 weeks Prior Function Level of Independence: Independent Communication Communication: No difficulties Dominant Hand: Right         Vision/Perception Vision - History Baseline Vision: Wears glasses only for reading Patient Visual Report: No change from baseline Perception Perception: Within Functional Limits   Cognition  Cognition Arousal/Alertness: Awake/alert Behavior During Therapy: WFL for tasks assessed/performed Overall Cognitive Status: Within Functional Limits for tasks  assessed    Extremity/Trunk Assessment Upper Extremity Assessment Upper Extremity Assessment: Overall WFL for tasks assessed;Generalized weakness;LUE deficits/detail LUE Deficits / Details: pain in chest area limits AROM of L UE. Pt reports he has been having difficulty with coordination, strength and ROM for a 4-5 weeks LUE: Unable to fully assess due to pain LUE Coordination: decreased fine motor;decreased gross motor Lower Extremity Assessment Lower Extremity Assessment: Defer to PT evaluation     Mobility Bed Mobility Bed Mobility: Sit to Supine;Sitting - Scoot to Edge of Bed;Scooting to [facility];Supine to Sit Supine to Sit: 2: Max assist;HOB elevated;With rails Sitting - Scoot to Edge of Bed: 3: Mod assist Sit to Supine: 2: Max assist;HOB flat Scooting to HOB: 2: Max assist Details for Bed Mobility Assistance: Assist required with LE to sit EOB and back onto bed; and with trunk to sit up. Pt states that he has diffuculty "feeling where his  L LE is on the floor" Transfers Transfers: Sit to Stand;Stand to Sit Sit to Stand: With upper extremity assist;From bed;From chair/3-in-1;With armrests;1: +1 Total assist Stand to Sit: To bed;To chair/3-in-1;With armrests;2: Max assist Details for Transfer Assistance: max A with L LE to pivot to chair/bed. cues for hand placement, technique. Pt states that he has diffuculty "feeling where his  L LE is on the floor"     Exercise     Balance Balance Balance Assessed: Yes Static Sitting Balance Static Sitting - Balance Support: Feet unsupported;Bilateral upper extremity supported Static Sitting - Level of Assistance: 5: Stand by assistance Dynamic Sitting Balance Dynamic Sitting - Balance Support: Left upper extremity supported;Feet unsupported;During functional activity Dynamic Sitting - Level of Assistance: 3: Mod assist Static Standing Balance Static Standing - Balance Support: Bilateral upper extremity supported Static Standing - Level of  Assistance:  3: Mod assist   End of Session OT - End of Session Equipment Utilized During Treatment: Gait belt;Rolling walker Activity Tolerance: Patient limited by fatigue;Patient limited by pain Patient left: in bed;with call bell/phone within reach;with bed alarm set  GO     Britt Bottom 09/20/2013, 1:08 PM

## 2013-09-20 NOTE — Progress Notes (Signed)
BP 80/50. NP notified. Order for 500 cc bolus given. Will continue to monitor per orders.

## 2013-09-20 NOTE — ED Notes (Signed)
Pt back from x-ray.

## 2013-09-20 NOTE — Progress Notes (Signed)
Utilization review completed. Shinika Estelle, RN, BSN. 

## 2013-09-20 NOTE — ED Notes (Signed)
Pt taken to xray 

## 2013-09-20 NOTE — H&P (Signed)
[facility] History and Physical  [name]. KCL:[id number] DOB: 01-Apr-1931 DOA: 09/19/2013  Referring physician: ED physician PCP:  Melinda Crutch, MD   Chief Complaint: fall  HPI:  Pt is 80 y.o. male with PMH of GERD, HTN, hyperlipidemia, large meningioma with planned craniotomy in the near future who presets to [facility] ED after an episode of fall at home several hours PTA. Pt explains he has been progressively weaker and has had frequent falls, today he fell on his chest. His pain is no in the anterior chest area, throbbing and persistent, 5/10 in severity, non radiating, worse with movement. He denies shortness of breath, no specific abdominal or urinary concerns. No LOC, no headaches, no visual changes.    In ED, pt stable, [facility] asked to admit medical floor for evaluation and may need placement.   Assessment and Plan: Active Problems:   Fall at home  - unclear etiology, ? Related to meningioma for which pt has craniotomy scheduled - pt has history of orthostatic hypotension - will admit to medical floor  - check orthostatic vitals, TSH - will need PT evaluation once medically more stable - continue analgesia as needed for symptom control    HTN - stable on admission - will continue home medical regimen   HLD - continue statin   Code Status: Full Family Communication: Pt at bedside Disposition Plan: Admit to medical floor   Review of Systems:  Constitutional: Negative for diaphoresis.  HENT: Negative for hearing loss, ear pain, nosebleeds, congestion, sore throat, neck pain, tinnitus and ear discharge.   Eyes: Negative for blurred vision, double vision, photophobia, pain, discharge and redness.  Respiratory: Negative for cough, hemoptysis, sputum production, shortness of breath, wheezing and stridor.   Cardiovascular: Negative for chest pain, palpitations, orthopnea, claudication and leg swelling.  Gastrointestinal: Negative for nausea, vomiting and abdominal pain.  Negative for heartburn, constipation, blood in stool and melena.  Genitourinary: Negative for dysuria, urgency, frequency, hematuria and flank pain.  Musculoskeletal: Negative for myalgias, back pain.  Skin: Negative for itching and rash.  Neurological: Negative for tingling, tremors, sensory change, speech change.  Endo/Heme/Allergies: Negative for environmental allergies and polydipsia. Does not bruise/bleed easily.  Psychiatric/Behavioral: Negative for suicidal ideas. The patient is not nervous/anxious.      Past Medical History  Diagnosis Date  . Anemia   . Prostate cancer   . Basal cell cancer   . Hypertension   . Hyperlipidemia   . Restless leg   . GERD (gastroesophageal reflux disease)   . Brain tumor     Past Surgical History  Procedure Laterality Date  . Joint replacement      right knee replacement  . Bilateral cateract surgery    . Vasectomy    . Tonsillectomy    . Colonoscopy      colon polyp removed    Social History:  reports that he has never smoked. He quit smokeless tobacco use about 39 years ago. His smokeless tobacco use included Chew. He reports that he does not drink alcohol or use illicit drugs.  Allergies  Allergen Reactions  . Asa [Aspirin] Nausea Only  . Vicodin [Hydrocodone-Acetaminophen] Nausea Only    Family History  Problem Relation Age of Onset  . Parkinsonism Mother   . Dementia Father     Prior to Admission medications   Medication Sig Start Date End Date Taking? Authorizing Provider  omeprazole (PRILOSEC) 20 MG capsule Take 20 mg by mouth daily.   Yes Historical Provider,  MD  rOPINIRole (REQUIP) 2 MG tablet Take 2 mg by mouth at bedtime.   Yes Historical Provider, MD  saccharomyces boulardii (FLORASTOR) 250 MG capsule Take 250 mg by mouth daily. 10/09/12  Yes Christina P Rama, MD  simvastatin (ZOCOR) 40 MG tablet Take 40 mg by mouth every evening.   Yes Historical Provider, MD  solifenacin (VESICARE) 5 MG tablet Take 5 mg by mouth  daily.   Yes Historical Provider, MD  Tamsulosin HCl (FLOMAX) 0.4 MG CAPS Take 0.4 mg by mouth daily.   Yes Historical Provider, MD  valsartan-hydrochlorothiazide (DIOVAN-HCT) 160-12.5 MG per tablet Take 1 tablet by mouth daily.   Yes Historical Provider, MD    Physical Exam: Filed Vitals:   09/20/13 0130 09/20/13 0230 09/20/13 0330 09/20/13 0400  BP: 138/76 121/68 130/68 108/50  Pulse: 80 80 78 77  Temp:      TempSrc:      Resp:      SpO2: 98% 96% 96% 95%    Physical Exam  Constitutional: Appears well-developed and well-nourished. No distress.  HENT: Normocephalic. External right and left ear normal. Oropharynx is clear and moist.  Eyes: Conjunctivae and EOM are normal. PERRLA, no scleral icterus.  Neck: Normal ROM. Neck supple. No JVD. No tracheal deviation. No thyromegaly.  CVS: RRR, S1/S2 +, no murmurs, no gallops, no carotid bruit.  Pulmonary: Effort and breath sounds normal, no stridor, rhonchi, wheezes, rales.  Abdominal: Soft. BS +,  no distension, tenderness, rebound or guarding.  Musculoskeletal: Normal range of motion. No edema and no tenderness.  Lymphadenopathy: No lymphadenopathy noted, cervical, inguinal. Neuro: Alert. Normal reflexes, muscle tone coordination. No cranial nerve deficit. Skin: Skin is warm and dry. No rash noted. Not diaphoretic. No erythema. No pallor.  Psychiatric: Normal mood and affect. Behavior, judgment, thought content normal.   Labs on Admission:  Basic Metabolic Panel:  Recent Labs Lab 09/18/13 1128 09/20/13 0207  NA 138 139  K 4.4 4.2  CL 99 100  CO2 27 27  GLUCOSE 87 105*  BUN 24* 27*  CREATININE 1.26 1.23  CALCIUM 8.9 9.4   CBC:  Recent Labs Lab 09/18/13 1128 09/20/13 0207  WBC 8.7 12.0*  NEUTROABS  --  9.8*  HGB 16.6 15.6  HCT 47.6 45.2  MCV 97.7 96.4  PLT 183 199   Radiological Exams on Admission: Dg Chest 2 View   09/18/2013   No active cardiopulmonary disease. Large hiatal hernia with air-fluid level.   Dg  Ribs Unilateral W/chest Left  09/20/2013    1. No acute displaced left-sided rib fractures. 2. Low lung volumes without radiographic evidence of acute cardiopulmonary disease. 3. Large hiatal hernia again noted.     EKG: Normal sinus rhythm, no ST/T wave changes  Faye Ramsay, MD  [facility] Pager [phone]  If 7PM-7AM, please contact night-coverage [email or web address] Password [facility] 09/20/2013, 4:41 AM

## 2013-09-21 DIAGNOSIS — G939 Disorder of brain, unspecified: Secondary | ICD-10-CM

## 2013-09-21 DIAGNOSIS — I951 Orthostatic hypotension: Secondary | ICD-10-CM

## 2013-09-21 DIAGNOSIS — N183 Chronic kidney disease, stage 3 unspecified: Secondary | ICD-10-CM

## 2013-09-21 DIAGNOSIS — E860 Dehydration: Secondary | ICD-10-CM

## 2013-09-21 DIAGNOSIS — I10 Essential (primary) hypertension: Secondary | ICD-10-CM

## 2013-09-21 LAB — CBC
HEMATOCRIT: 43.5 % (ref 39.0–52.0)
HEMOGLOBIN: 14.6 g/dL (ref 13.0–17.0)
MCH: 33.3 pg (ref 26.0–34.0)
MCHC: 33.6 g/dL (ref 30.0–36.0)
MCV: 99.3 fL (ref 78.0–100.0)
Platelets: 165 10*3/uL (ref 150–400)
RBC: 4.38 MIL/uL (ref 4.22–5.81)
RDW: 13.6 % (ref 11.5–15.5)
WBC: 15.2 10*3/uL — AB (ref 4.0–10.5)

## 2013-09-21 LAB — URINALYSIS, ROUTINE W REFLEX MICROSCOPIC
BILIRUBIN URINE: NEGATIVE
Bilirubin Urine: NEGATIVE
GLUCOSE, UA: NEGATIVE mg/dL
GLUCOSE, UA: NEGATIVE mg/dL
HGB URINE DIPSTICK: NEGATIVE
KETONES UR: NEGATIVE mg/dL
KETONES UR: 15 mg/dL — AB
Leukocytes, UA: NEGATIVE
Leukocytes, UA: NEGATIVE
NITRITE: NEGATIVE
Nitrite: NEGATIVE
PROTEIN: NEGATIVE mg/dL
Protein, ur: NEGATIVE mg/dL
SPECIFIC GRAVITY, URINE: 1.022 (ref 1.005–1.030)
Specific Gravity, Urine: 1.009 (ref 1.005–1.030)
Urobilinogen, UA: 0.2 mg/dL (ref 0.0–1.0)
Urobilinogen, UA: 0.2 mg/dL (ref 0.0–1.0)
pH: 6 (ref 5.0–8.0)
pH: 6 (ref 5.0–8.0)

## 2013-09-21 LAB — BASIC METABOLIC PANEL
BUN: 35 mg/dL — AB (ref 6–23)
CHLORIDE: 99 meq/L (ref 96–112)
CO2: 26 meq/L (ref 19–32)
Calcium: 8.7 mg/dL (ref 8.4–10.5)
Creatinine, Ser: 1.23 mg/dL (ref 0.50–1.35)
GFR calc non Af Amer: 54 mL/min — ABNORMAL LOW (ref >90)
GFR, EST AFRICAN AMERICAN: 62 mL/min — AB (ref >90)
GLUCOSE: 110 mg/dL — AB (ref 70–99)
Potassium: 4.2 mEq/L (ref 3.7–5.3)
Sodium: 137 mEq/L (ref 137–147)

## 2013-09-21 LAB — URINE MICROSCOPIC-ADD ON

## 2013-09-21 MED ORDER — TAMSULOSIN HCL 0.4 MG PO CAPS
0.4000 mg | ORAL_CAPSULE | Freq: Every day | ORAL | Status: DC
  Administered 2013-09-21 – 2013-10-02 (×11): 0.4 mg via ORAL
  Filled 2013-09-21 (×15): qty 1

## 2013-09-21 NOTE — Progress Notes (Signed)
Pt stated that he didn't feel right I asked him to clarify he stated that he felt hot and his feeling was something he had never felt before. Vital sign taken and recorded  in epic lung sounds clear he stated he is not nauseated but felt like he wanted to sit on the side of the bed with the help of NT he sat on side of bed and was given 5mg  of Oxi IR for his rib-cage pain he was then assisted back to bed in high fowler position. Will continue to monitor. Trust Crago LPN

## 2013-09-21 NOTE — Progress Notes (Signed)
Pt complaining of generalized discomfort. NT and RNs repositioned patient in bed. Pt also stated he felt hot. Temperature was 98.4. Oxycodone 5mg  was given. Will continue to monitor.

## 2013-09-21 NOTE — Progress Notes (Signed)
Bladder scan revealed 566ml. Patient I & O cath produced 569ml. Will continue to monitor.

## 2013-09-21 NOTE — Progress Notes (Signed)
[facility] Progress Note [place] TEAM [address] OEV:[id number] DOB: 07/05/1931 DOA: 09/19/2013 PCP:  Melinda Crutch, MD  Brief narrative:  80 year old male patient with history of hypertension and known gait disturbance. Has been following with a neurologist. Recent MRI revealed large meningioma with a craniotomy planned for 09/27/2013. Patient presented to the emergency department because of a fall at home which appear to be mechanical in nature. Prior to this fall patient also endorsed symptoms which may be orthostatic in nature stating he would get a little dizzy and fall backwards onto the bed when he attempted to stand. Patient is noted to be on antihypertensive medications as well as diuretics at home. When the patient fell he hit his anterior chest wall which was very tender upon evaluation but x-ray revealed no evidence of acute rib fractures although he does have some healing prior rib fractures. EKG was unrevealing in the emergency department.  Assessment/Plan: Active Problems:   Gait instability/Dizziness -likely multifactorial, it is likely that  Meningioma contributing. Patient also found to be orthostatic  -PT/OT eval pending once pain better controlled    Fall at home/chest wall pain -allergy to AS so will use Tylenol/Ultram/Oxycodone -Apply K pad -Inpatient rehab has been consulted.     Dehydration/Orthostasis -Maxide held -Continue IV fluids, with recheck orthostatics in AM   CKD (chronic kidney disease), stage III  -Stable   Hypertension -His blood pressures remain stable, presently off of antihypertensive agents    Restless leg syndrome -meds adjusted to usual home regimen     DVT prophylaxis: Lovenox Code Status: Full Family Communication: No family at bedside Disposition Plan/Expected LOS: Consultation to inpatient rehab made.  Isolation: None Nutritional Status:  Stable  Consultants: None  Procedures: None  Antibiotics: None  HPI/Subjective: Patient complains of ongoing thorasic wall pain, precipitated by movement. Overnight he reported "not feeling right," vitals checked, remained stable. He has been afebrile.   Objective: Blood pressure 138/92, pulse 88, temperature 97.9 F (36.6 C), temperature source Oral, resp. rate 20, height 5\' 9"  (1.753 m), weight 94 kg (207 lb 3.7 oz), SpO2 96.00%.  Intake/Output Summary (Last 24 hours) at 09/21/13 1540 Last data filed at 09/21/13 1500  Gross per 24 hour  Intake 1217.5 ml  Output      0 ml  Net 1217.5 ml     Exam: Follow up exam completed noting patient admitted at 5:46 AM today  Scheduled Meds:  Scheduled Meds: . beta carotene w/minerals  1 tablet Oral Daily  . darifenacin  7.5 mg Oral Daily  . enoxaparin (LOVENOX) injection  40 mg Subcutaneous Daily  . ferrous sulfate  325 mg Oral Q breakfast  . pantoprazole  40 mg Oral Daily  . rOPINIRole  1 mg Oral Q1500   And  . rOPINIRole  1 mg Oral QHS  . saccharomyces boulardii  250 mg Oral Daily  . simvastatin  40 mg Oral QPM   Continuous Infusions: . sodium chloride 75 mL/hr at 09/21/13 1045    **Reviewed in detail by the Attending Physician  Data Reviewed: Basic Metabolic Panel:  Recent Labs Lab 09/18/13 1128 09/20/13 0207 09/20/13 0619 09/21/13 0440  NA 138 139 141 137  K 4.4 4.2 4.5 4.2  CL 99 100 102 99  CO2 27 27 26 26   GLUCOSE 87 105* 96 110*  BUN 24* 27* 25* 35*  CREATININE 1.26 1.23 1.27 1.23  CALCIUM 8.9 9.4 9.1 8.7   Liver Function Tests: No  results found for this basename: AST, ALT, ALKPHOS, BILITOT, PROT, ALBUMIN,  in the last 168 hours No results found for this basename: LIPASE, AMYLASE,  in the last 168 hours No results found for this basename: AMMONIA,  in the last 168 hours CBC:  Recent Labs Lab 09/18/13 1128 09/20/13 0207 09/20/13 0619 09/21/13 0440  WBC 8.7 12.0* 11.6* 15.2*  NEUTROABS  --   9.8*  --   --   HGB 16.6 15.6 14.8 14.6  HCT 47.6 45.2 43.3 43.5  MCV 97.7 96.4 97.3 99.3  PLT 183 199 185 165   Cardiac Enzymes: No results found for this basename: CKTOTAL, CKMB, CKMBINDEX, TROPONINI,  in the last 168 hours BNP (last 3 results) No results found for this basename: PROBNP,  in the last 8760 hours CBG: No results found for this basename: GLUCAP,  in the last 168 hours  Recent Results (from the past 240 hour(s))  SURGICAL PCR SCREEN     Status: None   Collection Time    09/18/13 11:31 AM      Result Value Range Status   MRSA, PCR NEGATIVE  NEGATIVE Final   Staphylococcus aureus NEGATIVE  NEGATIVE Final   Comment:            The Xpert SA Assay (FDA     approved for NASAL specimens     in patients over 74 years of age),     is one component of     a comprehensive surveillance     program.  Test performance has     been validated by [employer] for patients greater     than or equal to 76 year old.     It is not intended     to diagnose infection nor to     guide or monitor treatment.     Studies:  Recent x-ray studies have been reviewed in detail by the Attending Physician   Time Spent: 35 minutes    If 7PM-7AM, please contact night-coverage [email or web address] Password [email or web address] 09/21/2013, 3:40 PM   LOS: 2 days

## 2013-09-22 ENCOUNTER — Inpatient Hospital Stay (HOSPITAL_COMMUNITY): Payer: Medicare Other

## 2013-09-22 DIAGNOSIS — R339 Retention of urine, unspecified: Secondary | ICD-10-CM

## 2013-09-22 DIAGNOSIS — G2581 Restless legs syndrome: Secondary | ICD-10-CM

## 2013-09-22 LAB — CBC
HEMATOCRIT: 40.6 % (ref 39.0–52.0)
Hemoglobin: 13.3 g/dL (ref 13.0–17.0)
MCH: 32.2 pg (ref 26.0–34.0)
MCHC: 32.8 g/dL (ref 30.0–36.0)
MCV: 98.3 fL (ref 78.0–100.0)
PLATELETS: 136 10*3/uL — AB (ref 150–400)
RBC: 4.13 MIL/uL — ABNORMAL LOW (ref 4.22–5.81)
RDW: 13.3 % (ref 11.5–15.5)
WBC: 10.3 10*3/uL (ref 4.0–10.5)

## 2013-09-22 LAB — BASIC METABOLIC PANEL
BUN: 24 mg/dL — ABNORMAL HIGH (ref 6–23)
CO2: 24 meq/L (ref 19–32)
CREATININE: 0.99 mg/dL (ref 0.50–1.35)
Calcium: 8.3 mg/dL — ABNORMAL LOW (ref 8.4–10.5)
Chloride: 102 mEq/L (ref 96–112)
GFR calc Af Amer: 87 mL/min — ABNORMAL LOW (ref >90)
GFR calc non Af Amer: 75 mL/min — ABNORMAL LOW (ref >90)
GLUCOSE: 97 mg/dL (ref 70–99)
Potassium: 4.3 mEq/L (ref 3.7–5.3)
Sodium: 136 mEq/L — ABNORMAL LOW (ref 137–147)

## 2013-09-22 MED ORDER — LACTULOSE 10 GM/15ML PO SOLN
20.0000 g | Freq: Two times a day (BID) | ORAL | Status: DC | PRN
  Filled 2013-09-22: qty 30

## 2013-09-22 MED ORDER — DOCUSATE SODIUM 100 MG PO CAPS
100.0000 mg | ORAL_CAPSULE | Freq: Every day | ORAL | Status: DC | PRN
  Administered 2013-09-22: 10:00:00 100 mg via ORAL
  Filled 2013-09-22: qty 1

## 2013-09-22 MED ORDER — POLYETHYLENE GLYCOL 3350 17 G PO PACK
17.0000 g | PACK | Freq: Every day | ORAL | Status: DC
  Administered 2013-09-22 – 2013-10-02 (×8): 17 g via ORAL
  Filled 2013-09-22 (×12): qty 1

## 2013-09-22 NOTE — Progress Notes (Signed)
[facility] Progress Note [place] TEAM [address]. BJY:[id number] DOB: Jan 29, 1931 DOA: 09/19/2013 PCP:  Melinda Crutch, MD  Brief narrative:  80 year old male patient with history of hypertension and known gait disturbance. Has been following with a neurologist. Recent MRI revealed large meningioma with a craniotomy planned for 09/27/2013. Patient presented to the emergency department because of a fall at home which appear to be mechanical in nature. Prior to this fall patient also endorsed symptoms which may be orthostatic in nature stating he would get a little dizzy and fall backwards onto the bed when he attempted to stand. Patient is noted to be on antihypertensive medications as well as diuretics at home. When the patient fell he hit his anterior chest wall which was very tender upon evaluation but x-ray revealed no evidence of acute rib fractures although he does have some healing prior rib fractures. EKG was unrevealing in the emergency department.  Assessment/Plan: Active Problems:   Gait instability/Dizziness -likely multifactorial, it is likely that  Meningioma contributing. Patient also found to be orthostatic  -PT/OT eval pending once pain better controlled    Fall at home/chest wall pain -allergy to AS so will use Tylenol/Ultram/Oxycodone -Apply K pad -Inpatient rehab has been consulted.   Urinary retention -Found to have 500 mL is on bladder scan, Foley catheter was placed. Continue Flomax  -Voiding trial in a.m.                          Dehydration/Orthostasis -Maxide held -Continue IV fluids, with recheck orthostatics in AM   CKD (chronic kidney disease), stage III  -Stable   Hypertension -His blood pressures remain stable, presently off of antihypertensive agents    Restless leg syndrome -meds adjusted to usual home regimen     DVT prophylaxis: Lovenox Code Status: Full Family Communication: No family at bedside Disposition Plan/Expected  LOS: Consultation to inpatient rehab made.  Isolation: None Nutritional Status: Stable  Consultants: None  Procedures: None  Antibiotics: None  HPI/Subjective: Patient complains of ongoing thorasic wall pain, precipitated by movement. Yesterday evening bladder scan showed residuals around 500 mL's, Foley catheter was placed. It was suspected that urinary retention may be contributing to patient's discomfort. He reports feeling "hot" however has not been noted to spike a temperature. A urine was checked which came back negative, blood cultures are in process. Chest x-ray was also obtained as official radiology interpretation is pending.                                    Objective: Blood pressure 124/75, pulse 83, temperature 97.9 F (36.6 C), temperature source Oral, resp. rate 20, height 5\' 9"  (1.753 m), weight 85.6 kg (188 lb 11.4 oz), SpO2 96.00%.  Intake/Output Summary (Last 24 hours) at 09/22/13 1624 Last data filed at 09/22/13 1500  Gross per 24 hour  Intake   1725 ml  Output   3510 ml  Net  -1785 ml     Exam: Follow up exam completed noting patient admitted at 5:46 AM today  Scheduled Meds:  Scheduled Meds: . beta carotene w/minerals  1 tablet Oral Daily  . darifenacin  7.5 mg Oral Daily  . enoxaparin (LOVENOX) injection  40 mg Subcutaneous Daily  . ferrous sulfate  325 mg Oral Q breakfast  . pantoprazole  40 mg Oral Daily  . polyethylene glycol  17 g Oral Daily  . rOPINIRole  1 mg Oral Q1500   And  . rOPINIRole  1 mg Oral QHS  . saccharomyces boulardii  250 mg Oral Daily  . simvastatin  40 mg Oral QPM  . tamsulosin  0.4 mg Oral QPC supper   Continuous Infusions: . sodium chloride 75 mL/hr at 09/21/13 2248    **Reviewed in detail by the Attending Physician  Data Reviewed: Basic Metabolic Panel:  Recent Labs Lab 09/18/13 1128 09/20/13 0207 09/20/13 0619 09/21/13 0440 09/22/13 0410  NA 138 139 141 137 136*  K 4.4 4.2 4.5 4.2 4.3  CL 99 100 102 99  102  CO2 27 27 26 26 24   GLUCOSE 87 105* 96 110* 97  BUN 24* 27* 25* 35* 24*  CREATININE 1.26 1.23 1.27 1.23 0.99  CALCIUM 8.9 9.4 9.1 8.7 8.3*   Liver Function Tests: No results found for this basename: AST, ALT, ALKPHOS, BILITOT, PROT, ALBUMIN,  in the last 168 hours No results found for this basename: LIPASE, AMYLASE,  in the last 168 hours No results found for this basename: AMMONIA,  in the last 168 hours CBC:  Recent Labs Lab 09/18/13 1128 09/20/13 0207 09/20/13 0619 09/21/13 0440 09/22/13 0905  WBC 8.7 12.0* 11.6* 15.2* 10.3  NEUTROABS  --  9.8*  --   --   --   HGB 16.6 15.6 14.8 14.6 13.3  HCT 47.6 45.2 43.3 43.5 40.6  MCV 97.7 96.4 97.3 99.3 98.3  PLT 183 199 185 165 136*   Cardiac Enzymes: No results found for this basename: CKTOTAL, CKMB, CKMBINDEX, TROPONINI,  in the last 168 hours BNP (last 3 results) No results found for this basename: PROBNP,  in the last 8760 hours CBG: No results found for this basename: GLUCAP,  in the last 168 hours  Recent Results (from the past 240 hour(s))  SURGICAL PCR SCREEN     Status: None   Collection Time    09/18/13 11:31 AM      Result Value Range Status   MRSA, PCR NEGATIVE  NEGATIVE Final   Staphylococcus aureus NEGATIVE  NEGATIVE Final   Comment:            The Xpert SA Assay (FDA     approved for NASAL specimens     in patients over 38 years of age),     is one component of     a comprehensive surveillance     program.  Test performance has     been validated by [employer] for patients greater     than or equal to 55 year old.     It is not intended     to diagnose infection nor to     guide or monitor treatment.     Studies:  Recent x-ray studies have been reviewed in detail by the Attending Physician   Time Spent: 35 minutes    If 7PM-7AM, please contact night-coverage [email or web address] Password [email or web address] 09/22/2013, 4:24 PM   LOS: 3 days

## 2013-09-23 DIAGNOSIS — R269 Unspecified abnormalities of gait and mobility: Secondary | ICD-10-CM

## 2013-09-23 DIAGNOSIS — K5900 Constipation, unspecified: Secondary | ICD-10-CM

## 2013-09-23 DIAGNOSIS — J9819 Other pulmonary collapse: Secondary | ICD-10-CM

## 2013-09-23 MED ORDER — LEVOFLOXACIN IN D5W 750 MG/150ML IV SOLN
750.0000 mg | INTRAVENOUS | Status: DC
  Administered 2013-09-23: 12:00:00 750 mg via INTRAVENOUS
  Filled 2013-09-23: qty 150

## 2013-09-23 MED ORDER — PEG 3350-KCL-NA BICARB-NACL 420 G PO SOLR
4000.0000 mL | Freq: Once | ORAL | Status: AC
  Administered 2013-09-23: 15:00:00 4000 mL via ORAL
  Filled 2013-09-23 (×2): qty 4000

## 2013-09-23 MED ORDER — MILK AND MOLASSES ENEMA
1.0000 | Freq: Once | RECTAL | Status: AC
  Administered 2013-09-23: 250 mL via RECTAL
  Filled 2013-09-23: qty 250

## 2013-09-23 MED ORDER — MORPHINE SULFATE 2 MG/ML IJ SOLN
2.0000 mg | INTRAMUSCULAR | Status: DC | PRN
  Administered 2013-09-23 – 2013-09-28 (×7): 2 mg via INTRAVENOUS
  Filled 2013-09-23 (×7): qty 1

## 2013-09-23 MED ORDER — LIDOCAINE 5 % EX PTCH
1.0000 | MEDICATED_PATCH | CUTANEOUS | Status: DC
  Administered 2013-09-23 – 2013-10-03 (×10): 1 via TRANSDERMAL
  Filled 2013-09-23 (×13): qty 1

## 2013-09-23 MED ORDER — WHITE PETROLATUM GEL
Status: AC
  Administered 2013-09-23: 0.2
  Filled 2013-09-23: qty 5

## 2013-09-23 NOTE — Clinical Social Work Placement (Signed)
Clinical Social Work Department CLINICAL SOCIAL WORK PLACEMENT NOTE 09/23/2013  Patient:  [name], [name]  Account Number:  [id number] [place] date:  09/19/2013  Clinical Social Worker:  Kemper Durie, [place]  Date/time:  09/23/2013 04:00 PM  Clinical Social Work is seeking post-discharge placement for this patient at the following level of care:   [facility]   (*CSW will update this form in Epic as items are completed)   09/23/2013  Patient/family provided with [facility] Department of Clinical Social Work's list of facilities offering this level of care within the geographic area requested by the patient (or if unable, by the patient's family).  09/23/2013  Patient/family informed of their freedom to choose among providers that offer the needed level of care, that participate in Medicare, Medicaid or managed care program needed by the patient, have an available bed and are willing to accept the patient.  09/23/2013  Patient/family informed of [facility]' ownership interest in [facility], as well as of the fact that they are under no obligation to receive care at this facility.  PASARR submitted to EDS on 09/23/2013 PASARR number received from EDS on 09/23/2013  FL2 transmitted to all facilities in geographic area requested by pt/family on  09/23/2013 FL2 transmitted to all facilities within larger geographic area on   Patient informed that his/her managed care company has contracts with or will negotiate with  certain facilities, including the following:     Patient/family informed of bed offers received:   Patient chooses bed at  Physician recommends and patient chooses bed at    Patient to be transferred to  on   Patient to be transferred to facility by   The following physician request were entered in Epic:   Additional Comments:   Liz Beach, [facility], [place], [phone]

## 2013-09-23 NOTE — Clinical Social Work Psychosocial (Signed)
Clinical Social Work Department BRIEF PSYCHOSOCIAL ASSESSMENT 09/23/2013  Patient:  KAMIR, SELOVER     Account Number:  [id number]     Admit date:  09/19/2013  Clinical Social Worker:  Lovey Newcomer  Date/Time:  09/23/2013 03:30 PM  Referred by:  Physician  Date Referred:  09/23/2013 Referred for  SNF Placement   Other Referral:   Interview type:  Patient Other interview type:   Patient alert and oriented at time of assessment.    PSYCHOSOCIAL DATA Living Status:  WIFE Admitted from facility:   Level of care:   Primary support name:  Jaxin Fulfer Primary support relationship to patient:  SPOUSE Degree of support available:   Support is good.    CURRENT CONCERNS Current Concerns  Post-Acute Placement   Other Concerns:    SOCIAL WORK ASSESSMENT / PLAN CSW met with patient at bedside to discuss his plan for discharge. Patient is being considered by CIR, but patient is agreeable to SNF backup plan if CIR is not a possibility. Patient states that he has a preference for [facility] or [facility]. Patient states that he lives at home with his wife and he plans to return home after CIR or SNF. Patient does not report a past SNF stay.   Assessment/plan status:  Psychosocial Support/Ongoing Assessment of Needs Other assessment/ plan:   Complete Fl2, Fax, PASRR, Blue Medicare Auth   Information/referral to community resources:   CSW contact information and SNF list given to patient.    PATIENT'S/FAMILY'S RESPONSE TO PLAN OF CARE: Patient is agreeable to SNF as backup to CIR, but is hopeful that he will get CIR. Patient was pleasant, appropriate, and appreciative of CSW contact. Patient awaits bed offers. CSW will follow.       Liz Beach, [place], [place], [phone]

## 2013-09-23 NOTE — Progress Notes (Signed)
[facility] lab called and reported the pt has gram positive cocci in clusters in the aerobic bottle. Will notify on call MD.

## 2013-09-23 NOTE — Progress Notes (Addendum)
Physical medicine and rehabilitation consult requested with chart reviewed. Patient with larger known meningioma. Need confirmation from neurosurgery in relation to upcoming surgical intervention craniotomy plan 09/27/2013. Will await followup documentation and complete formal rehabilitation consult accordingly.

## 2013-09-23 NOTE — Progress Notes (Signed)
[facility] Progress Note  TEAM [address] [id number] DOB: January 10, 1931 DOA: 09/19/2013 PCP:  Melinda Crutch, MD  Brief narrative:  80 year old male patient with history of hypertension and known gait disturbance. Has been following with a neurologist.  Patient presented to the emergency department because of a fall at home which appear to be mechanical in nature. Prior to this fall patient also endorsed symptoms which may be orthostatic in nature stating he would get a little dizzy and fall backwards onto the bed when he attempted to stand. Patient is noted to be on antihypertensive medications as well as diuretics at home. When the patient fell he hit his anterior chest wall which was very tender upon evaluation but x-ray revealed no evidence of acute rib fractures although he does have some healing prior rib fractures. EKG was unrevealing in the emergency department.  Assessment/Plan: Active Problems:   Gait instability/Dizziness -likely secondary to Meningioma, although he was also found to be orthostatic  -PT/OT eval pending once pain better controlled  Meningioma -Recent MRI revealed large meningioma with a craniotomy planned for 09/27/2013. -I spoke to his neurosurgeon Dr Kathyrn Sheriff, would not be able to operate on him earlier -Will plan on craniotomy for this Friday, 09/27/2048  Constipation -Reports not having a bowel movement in 7 days, enema unsuccessful today, will provide Golytely today   Chest wall pain -Secondary to fall, patient complaining of ongoing chest wall pain. Will place lidocaine patch today, continue as needed narcotic analgesics  Probable atelectasis. Chest x-ray showing left lower lobe infiltrate which I think is secondary to atelectasis given shallow inspirations. Patient unable to take deep breaths due to chest wall pain. He does not appear to have clinical signs or symptoms suggestive of pneumonia. Provide incentive spirometry,  analgesia for pain symptoms, will monitor.  Urinary retention -Found to have 500 mL is on bladder scan, Foley catheter was placed. Continue Flomax  -Voiding trial in a.m.                          Dehydration/Orthostasis -Maxide held -Orthostatics were checked this morning as he remains orthostatic. Will continue running NS at 75 ml/hour.    CKD (chronic kidney disease), stage III  -Stable   Hypertension -His blood pressures remain stable, presently off of antihypertensive agents    Restless leg syndrome -meds adjusted to usual home regimen     DVT prophylaxis: Lovenox Code Status: Full Family Communication: No family at bedside Disposition Plan/Expected LOS: Consultation to inpatient rehab made.  Isolation: None Nutritional Status: Stable  Consultants: None  Procedures: None  Antibiotics: None  HPI/Subjective: Patient complains of ongoing thorasic wall pain, precipitated by movement. Yesterday evening bladder scan showed residuals around 500 mL's, Foley catheter was placed. It was suspected that urinary retention may be contributing to patient's discomfort. He reports feeling "hot" however has not been noted to spike a temperature. A urine was checked which came back negative, blood cultures are in process.   Patient complaining of ongoing chest wall pain.                                    Objective: Blood pressure 118/73, pulse 80, temperature 98.3 F (36.8 C), temperature source Oral, resp. rate 17, height 5\' 9"  (1.753 m), weight 85.6 kg (188 lb 11.4 oz), SpO2 94.00%.  Intake/Output Summary (Last 24  hours) at 09/23/13 1341 Last data filed at 09/23/13 4010  Gross per 24 hour  Intake    720 ml  Output   2220 ml  Net  -1500 ml     Exam: Follow up exam completed noting patient admitted at 5:46 AM today  Scheduled Meds:  Scheduled Meds: . beta carotene w/minerals  1 tablet Oral Daily  . darifenacin  7.5 mg Oral Daily  . enoxaparin (LOVENOX) injection  40  mg Subcutaneous Daily  . ferrous sulfate  325 mg Oral Q breakfast  . levofloxacin (LEVAQUIN) IV  750 mg Intravenous Q24H  . lidocaine  1 patch Transdermal Q24H  . pantoprazole  40 mg Oral Daily  . polyethylene glycol  17 g Oral Daily  . polyethylene glycol-electrolytes  4,000 mL Oral Once  . rOPINIRole  1 mg Oral Q1500   And  . rOPINIRole  1 mg Oral QHS  . saccharomyces boulardii  250 mg Oral Daily  . simvastatin  40 mg Oral QPM  . tamsulosin  0.4 mg Oral QPC supper   Continuous Infusions: . sodium chloride 75 mL/hr at 09/21/13 2248    **Reviewed in detail by the Attending Physician  Data Reviewed: Basic Metabolic Panel:  Recent Labs Lab 09/18/13 1128 09/20/13 0207 09/20/13 0619 09/21/13 0440 09/22/13 0410  NA 138 139 141 137 136*  K 4.4 4.2 4.5 4.2 4.3  CL 99 100 102 99 102  CO2 27 27 26 26 24   GLUCOSE 87 105* 96 110* 97  BUN 24* 27* 25* 35* 24*  CREATININE 1.26 1.23 1.27 1.23 0.99  CALCIUM 8.9 9.4 9.1 8.7 8.3*   Liver Function Tests: No results found for this basename: AST, ALT, ALKPHOS, BILITOT, PROT, ALBUMIN,  in the last 168 hours No results found for this basename: LIPASE, AMYLASE,  in the last 168 hours No results found for this basename: AMMONIA,  in the last 168 hours CBC:  Recent Labs Lab 09/18/13 1128 09/20/13 0207 09/20/13 0619 09/21/13 0440 09/22/13 0905  WBC 8.7 12.0* 11.6* 15.2* 10.3  NEUTROABS  --  9.8*  --   --   --   HGB 16.6 15.6 14.8 14.6 13.3  HCT 47.6 45.2 43.3 43.5 40.6  MCV 97.7 96.4 97.3 99.3 98.3  PLT 183 199 185 165 136*   Cardiac Enzymes: No results found for this basename: CKTOTAL, CKMB, CKMBINDEX, TROPONINI,  in the last 168 hours BNP (last 3 results) No results found for this basename: PROBNP,  in the last 8760 hours CBG: No results found for this basename: GLUCAP,  in the last 168 hours  Recent Results (from the past 240 hour(s))  SURGICAL PCR SCREEN     Status: None   Collection Time    09/18/13 11:31 AM       Result Value Range Status   MRSA, PCR NEGATIVE  NEGATIVE Final   Staphylococcus aureus NEGATIVE  NEGATIVE Final   Comment:            The Xpert SA Assay (FDA     approved for NASAL specimens     in patients over 65 years of age),     is one component of     a comprehensive surveillance     program.  Test performance has     been validated by [employer] for patients greater     than or equal to 69 year old.     It is not intended  to diagnose infection nor to     guide or monitor treatment.  CULTURE, BLOOD (ROUTINE X 2)     Status: None   Collection Time    09/21/13  7:42 PM      Result Value Range Status   Specimen Description BLOOD LEFT HAND   Final   Special Requests BOTTLES DRAWN AEROBIC ONLY 10CC   Final   Culture  Setup Time     Final   Value: 09/22/2013 01:27     Performed at [facility]   Culture     Final   Value:        BLOOD CULTURE RECEIVED NO GROWTH TO DATE CULTURE WILL BE HELD FOR 5 DAYS BEFORE ISSUING A FINAL NEGATIVE REPORT     Performed at [facility]   Report Status PENDING   Incomplete  CULTURE, BLOOD (ROUTINE X 2)     Status: None   Collection Time    09/21/13  7:46 PM      Result Value Range Status   Specimen Description BLOOD RIGHT ANTECUBITAL   Final   Special Requests BOTTLES DRAWN AEROBIC AND ANAEROBIC 10CC EA   Final   Culture  Setup Time     Final   Value: 09/22/2013 01:26     Performed at [facility]   Culture     Final   Value: GRAM POSITIVE COCCI IN CLUSTERS     Note: Gram Stain Report Called to,Read Back By and Verified With: Glade Lloyd RN on 09/23/13 at 02:05 by Rise Mu     Performed at [facility]   Report Status PENDING   Incomplete     Studies:  Recent x-ray studies have been reviewed in detail by the Attending Physician   Time Spent: 35 minutes    If 7PM-7AM, please contact night-coverage [email or web address] Password [email or web address] 09/23/2013, 1:41 PM   LOS: 4 days

## 2013-09-24 ENCOUNTER — Ambulatory Visit: Payer: Medicare Other

## 2013-09-24 ENCOUNTER — Inpatient Hospital Stay (HOSPITAL_COMMUNITY): Payer: Medicare Other

## 2013-09-24 DIAGNOSIS — R0789 Other chest pain: Secondary | ICD-10-CM

## 2013-09-24 LAB — BASIC METABOLIC PANEL
BUN: 16 mg/dL (ref 6–23)
CALCIUM: 8 mg/dL — AB (ref 8.4–10.5)
CO2: 25 mEq/L (ref 19–32)
CREATININE: 0.93 mg/dL (ref 0.50–1.35)
Chloride: 100 mEq/L (ref 96–112)
GFR, EST AFRICAN AMERICAN: 90 mL/min — AB (ref >90)
GFR, EST NON AFRICAN AMERICAN: 77 mL/min — AB (ref >90)
Glucose, Bld: 85 mg/dL (ref 70–99)
Potassium: 3.9 mEq/L (ref 3.7–5.3)
Sodium: 136 mEq/L — ABNORMAL LOW (ref 137–147)

## 2013-09-24 LAB — CBC
HCT: 35.6 % — ABNORMAL LOW (ref 39.0–52.0)
Hemoglobin: 12.1 g/dL — ABNORMAL LOW (ref 13.0–17.0)
MCH: 33.1 pg (ref 26.0–34.0)
MCHC: 34 g/dL (ref 30.0–36.0)
MCV: 97.3 fL (ref 78.0–100.0)
Platelets: 160 10*3/uL (ref 150–400)
RBC: 3.66 MIL/uL — ABNORMAL LOW (ref 4.22–5.81)
RDW: 12.9 % (ref 11.5–15.5)
WBC: 7.4 10*3/uL (ref 4.0–10.5)

## 2013-09-24 LAB — INFLUENZA PANEL BY PCR (TYPE A & B)
H1N1FLUPCR: NOT DETECTED
INFLBPCR: NEGATIVE
Influenza A By PCR: NEGATIVE

## 2013-09-24 MED ORDER — ENSURE COMPLETE PO LIQD
237.0000 mL | ORAL | Status: DC
  Administered 2013-09-24 – 2013-09-30 (×6): 237 mL via ORAL

## 2013-09-24 NOTE — Progress Notes (Signed)
Physical Therapy Treatment Patient Details Name: [name]. MRN: [id number] DOB: 04/17/1931 Today's Date: 09/24/2013 Time: 2595-6387 PT Time Calculation (min): 45 min  PT Assessment / Plan / Recommendation  History of Present Illness Pt is 80 y.o. male with PMH of GERD, HTN, hyperlipidemia, large meningioma with planned craniotomy in the near future who presets to [facility] ED after an episode of fall at home several hours PTA. Pt explains he has been progressively weaker and has had frequent falls, today he fell on his chest. His pain is no in the anterior chest area, throbbing and persistent, 5/10 in severity, non radiating, worse with movement. He denies shortness of breath, no specific abdominal or urinary concerns. No LOC, no headaches, no visual changes.     PT Comments   Pt progressing with mobility and note somewhat decreased extensor tone in LLE, however continues to demonstrate flex tone in LUE.  Performed several reps of standing in stedy and min to mod assist and also performed weight shifts R and L for increased WB through LLE to assist with decreased tone and provide proprioceptive feedback.  Tolerated all well on RA with SaO2 in higher 90's throughout session.  RN notified.          Follow Up Recommendations  CIR     Does the patient have the potential to tolerate intense rehabilitation     Barriers to Discharge        Equipment Recommendations  Wheelchair (measurements PT);Wheelchair cushion (measurements PT);Hospital bed    Recommendations for Other Services Rehab consult  Frequency Min 3X/week   Progress towards PT Goals Progress towards PT goals: Progressing toward goals  Plan Current plan remains appropriate    Precautions / Restrictions Precautions Precautions: Fall Precaution Comments: left leg and arm weakness, dyscoordination, and increased tone.   Restrictions Weight Bearing Restrictions: No   Pertinent Vitals/Pain Pt with increased sensitivity in LLE, however  tolerated session well.     Mobility  Bed Mobility Bed Mobility: Supine to Sit;Sitting - Scoot to Edge of Bed Supine to Sit: With rails;HOB elevated;2: Max assist Sitting - Scoot to Edge of Bed: 3: Mod assist;With rail Details for Bed Mobility Assistance: Assist needed to progress each LE out of bed, esp LLE.  Also requires assist to elevate trunk into sitting.  He was able to use RUE on handrail to self assist, however unable to fully elevate on his own.  Transfers Transfers: Sit to Stand;Stand to Sit Sit to Stand: 3: Mod assist;2: Max assist;From bed Stand to Sit: 3: Mod assist;Without upper extremity assist;With armrests;To chair/3-in-1 Transfer via Lift Equipment: Stedy Details for Transfer Assistance: Continue to use Stedy to increase safety for standing and transfers.  Performed 5 reps of sit to and from stand at mod assist initially from bed due to lower surface, however from stedy only required min assist to stand.  Provided mod cues for increased forward weight shift with activation of glutes and quads for upright stance.  In standing, performing weight shifts R and L to increase WB through LLE with tapping assist at L quad to increase activation.   Ambulation/Gait Ambulation/Gait Assistance: Not tested (comment)    Exercises     PT Diagnosis:    PT Problem List:   PT Treatment Interventions:     PT Goals (current goals can now be found in the care plan section) Acute Rehab PT Goals PT Goal Formulation: With patient/family Time For Goal Achievement: 10/04/13 Potential to Achieve Goals: Good  Visit Information  Last PT Received On: 09/24/13 Assistance Needed: +2 (if going to attempt standing without stedy) History of Present Illness: Pt is 80 y.o. male with PMH of GERD, HTN, hyperlipidemia, large meningioma with planned craniotomy in the near future who presets to [facility] ED after an episode of fall at home several hours PTA. Pt explains he has been progressively weaker and has  had frequent falls, today he fell on his chest. His pain is no in the anterior chest area, throbbing and persistent, 5/10 in severity, non radiating, worse with movement. He denies shortness of breath, no specific abdominal or urinary concerns. No LOC, no headaches, no visual changes.      Subjective Data      Cognition  Cognition Arousal/Alertness: Awake/alert Behavior During Therapy: WFL for tasks assessed/performed Overall Cognitive Status: Within Functional Limits for tasks assessed    Balance     End of Session PT - End of Session Activity Tolerance: Patient tolerated treatment well;Patient limited by fatigue Patient left: in chair;with call bell/phone within reach;with family/visitor present Nurse Communication: Mobility status;Need for lift equipment   GP     Denice Bors 09/24/2013, 3:56 PM

## 2013-09-24 NOTE — Progress Notes (Addendum)
[facility] Progress Note [place] TEAM [address] WCH:[id number] DOB: 1931/06/27 DOA: 09/19/2013 PCP:  Melinda Crutch, MD  Brief narrative:  80 year old male patient with history of meningioma, hypertension and known gait disturbance.  Patient presented to the emergency department because of a fall at home which appear to be mechanical in nature. Prior to this fall patient also endorsed symptoms which may be orthostatic in nature stating he would get a little dizzy and fall backwards onto the bed when he attempted to stand. Patient is noted to be on antihypertensive medications as well as diuretics at home. When the patient fell he hit his anterior chest wall which was very tender upon evaluation but x-ray revealed no evidence of acute rib fractures although he does have some healing prior rib fractures. EKG was unrevealing in the emergency department. During this hospitalization he has complained of severe 10 out of 10 pain  thoracic wall pain  with inspiration and movement. He has been administered as needed oral and IV narcotic analgesics and lidocaine patch for pain management. Patient's hospitalization has been complicated by the development of urinary retention for which a Foley catheter was placed. His hospitalization has also been complicated by the development of constipation, as he was administered  stool softeners, enema, and finally GoLYTELY which achieved results. other issues include microbiology reporting one of his blood cultures to be positive for gram-positive cocci in clusters, as well as the presence of  bibasilar atelectasis versus mild pneumonia  on x-rays taken on 09/22/2013 and 09/24/2013. With his thoracic wall pain and inability to take deep breaths, I would tend to favor atelectasis. His white count this morning was 7400, as he has remained afebrile and nontoxic. Currently he is not on empiric IV antibiotic therapy.  Would reassess the need for antibiotic therapy in  a.m. With regard to his meningioma  he is scheduled to undergo craniotomy for this Friday. His neurosurgeon Dr.Nundkumar aware of patient's hospitalization. Patient will likely stay in the inpatient service until Fridays procedure.                                                                                                                                                             Assessment/Plan: Active Problems:   Gait instability/Dizziness -likely secondary to Meningioma, although he was also found to be orthostatic  -PT/OT eval pending once pain better controlled  Meningioma -Recent MRI revealed large meningioma with a craniotomy planned for 09/27/2013. -I spoke to his neurosurgeon Dr Kathyrn Sheriff, would not be able to operate on him earlier -Will plan on craniotomy for this Friday, 09/27/2048  Constipation -Resolved.    Chest wall pain -Secondary to fall, patient complaining of ongoing chest wall pain. Will place lidocaine patch today, continue as needed narcotic  analgesics  Probable atelectasis. Chest x-ray showing left lower lobe infiltrate which I think is secondary to atelectasis given shallow inspirations. Patient unable to take deep breaths due to chest wall pain. He does not appear to have clinical signs or symptoms suggestive of pneumonia. Provide incentive spirometry, analgesia for pain symptoms, will monitor.   Urinary retention -Found to have 500 mL is on bladder scan, Foley catheter was placed. Continue Flomax                      Dehydration/Orthostasis -Maxide held -He remains of NS at 75 ml/hour   CKD (chronic kidney disease), stage III  -Stable   Hypertension -His blood pressures remain stable, presently off of antihypertensive agents    Restless leg syndrome -meds adjusted to usual home regimen     DVT prophylaxis: Lovenox Code Status: Full Family Communication: No family at bedside Disposition Plan/Expected LOS: Consultation to inpatient rehab made.   Isolation: None Nutritional Status: Stable  Consultants: Physical therapy  Procedures: None  Antibiotics: None  HPI/Subjective: Patient complains of ongoing thorasic wall pain, precipitated by movement. Yesterday evening bladder scan showed residuals around 500 mL's, Foley catheter was placed. It was suspected that urinary retention may be contributing to patient's discomfort. He reports feeling "hot" however has not been noted to spike a temperature. A urine was checked which came back negative, blood cultures are in process.   Patient complaining of ongoing chest wall pain.                                    Objective: Blood pressure 120/67, pulse 80, temperature 98.2 F (36.8 C), temperature source Oral, resp. rate 18, height 5\' 9"  (1.753 m), weight 85.6 kg (188 lb 11.4 oz), SpO2 98.00%.  Intake/Output Summary (Last 24 hours) at 09/24/13 1712 Last data filed at 09/24/13 1044  Gross per 24 hour  Intake 2917.5 ml  Output   1200 ml  Net 1717.5 ml     Exam: Follow up exam completed noting patient admitted at 5:46 AM today  Scheduled Meds:  Scheduled Meds: . beta carotene w/minerals  1 tablet Oral Daily  . darifenacin  7.5 mg Oral Daily  . enoxaparin (LOVENOX) injection  40 mg Subcutaneous Daily  . feeding supplement (ENSURE COMPLETE)  237 mL Oral Q24H  . ferrous sulfate  325 mg Oral Q breakfast  . lidocaine  1 patch Transdermal Q24H  . pantoprazole  40 mg Oral Daily  . polyethylene glycol  17 g Oral Daily  . rOPINIRole  1 mg Oral Q1500   And  . rOPINIRole  1 mg Oral QHS  . saccharomyces boulardii  250 mg Oral Daily  . simvastatin  40 mg Oral QPM  . tamsulosin  0.4 mg Oral QPC supper   Continuous Infusions: . sodium chloride 75 mL/hr at 09/24/13 0425    **Reviewed in detail by the Attending Physician  Data Reviewed: Basic Metabolic Panel:  Recent Labs Lab 09/20/13 0207 09/20/13 0619 09/21/13 0440 09/22/13 0410 09/24/13 0528  NA 139 141 137 136* 136*   K 4.2 4.5 4.2 4.3 3.9  CL 100 102 99 102 100  CO2 27 26 26 24 25   GLUCOSE 105* 96 110* 97 85  BUN 27* 25* 35* 24* 16  CREATININE 1.23 1.27 1.23 0.99 0.93  CALCIUM 9.4 9.1 8.7 8.3* 8.0*   Liver Function Tests: No results found for  this basename: AST, ALT, ALKPHOS, BILITOT, PROT, ALBUMIN,  in the last 168 hours No results found for this basename: LIPASE, AMYLASE,  in the last 168 hours No results found for this basename: AMMONIA,  in the last 168 hours CBC:  Recent Labs Lab 09/20/13 0207 09/20/13 0619 09/21/13 0440 09/22/13 0905 09/24/13 0528  WBC 12.0* 11.6* 15.2* 10.3 7.4  NEUTROABS 9.8*  --   --   --   --   HGB 15.6 14.8 14.6 13.3 12.1*  HCT 45.2 43.3 43.5 40.6 35.6*  MCV 96.4 97.3 99.3 98.3 97.3  PLT 199 185 165 136* 160   Cardiac Enzymes: No results found for this basename: CKTOTAL, CKMB, CKMBINDEX, TROPONINI,  in the last 168 hours BNP (last 3 results) No results found for this basename: PROBNP,  in the last 8760 hours CBG: No results found for this basename: GLUCAP,  in the last 168 hours  Recent Results (from the past 240 hour(s))  SURGICAL PCR SCREEN     Status: None   Collection Time    09/18/13 11:31 AM      Result Value Range Status   MRSA, PCR NEGATIVE  NEGATIVE Final   Staphylococcus aureus NEGATIVE  NEGATIVE Final   Comment:            The Xpert SA Assay (FDA     approved for NASAL specimens     in patients over 79 years of age),     is one component of     a comprehensive surveillance     program.  Test performance has     been validated by [employer] for patients greater     than or equal to 81 year old.     It is not intended     to diagnose infection nor to     guide or monitor treatment.  CULTURE, BLOOD (ROUTINE X 2)     Status: None   Collection Time    09/21/13  7:42 PM      Result Value Range Status   Specimen Description BLOOD LEFT HAND   Final   Special Requests BOTTLES DRAWN AEROBIC ONLY 10CC   Final   Culture  Setup Time      Final   Value: 09/22/2013 01:27     Performed at [facility]   Culture     Final   Value:        BLOOD CULTURE RECEIVED NO GROWTH TO DATE CULTURE WILL BE HELD FOR 5 DAYS BEFORE ISSUING A FINAL NEGATIVE REPORT     Performed at [facility]   Report Status PENDING   Incomplete  CULTURE, BLOOD (ROUTINE X 2)     Status: None   Collection Time    09/21/13  7:46 PM      Result Value Range Status   Specimen Description BLOOD RIGHT ANTECUBITAL   Final   Special Requests BOTTLES DRAWN AEROBIC AND ANAEROBIC 10CC EA   Final   Culture  Setup Time     Final   Value: 09/22/2013 01:26     Performed at [facility]   Culture     Final   Value: GRAM POSITIVE COCCI IN CLUSTERS     Note: Gram Stain Report Called to,Read Back By and Verified With: Glade Lloyd RN on 09/23/13 at 02:05 by Rise Mu     Performed at [facility]   Report Status PENDING   Incomplete  Studies:  Recent x-ray studies have been reviewed in detail by the Attending Physician   Time Spent: 35 minutes    If 7PM-7AM, please contact night-coverage [email or web address] Password [email or web address] 09/24/2013, 5:12 PM   LOS: 5 days

## 2013-09-24 NOTE — Progress Notes (Signed)
Spoke with Mr. Giammarco and his wife this morning. Will plan on craniotomy for resection of meningioma on Fri, assuming he is stable from pulmonary standpoint.

## 2013-09-24 NOTE — Progress Notes (Signed)
INITIAL NUTRITION ASSESSMENT  DOCUMENTATION CODES Per approved criteria  -Non-severe (moderate) malnutrition in the context of acute illness or injury   INTERVENTION: Add Ensure Complete po daily, each supplement provides 350 kcal and 13 grams of protein. Encouraged adequate nutrition in preparation of upcoming surgery. RD to continue to follow nutrition care plan.  NUTRITION DIAGNOSIS: Increased nutrient needs related to brain mass and upcoming surgery as evidenced by estimated needs.   Goal: Intake to meet >90% of estimated nutrition needs.  Monitor:  weight trends, lab trends, I/O's, PO intake, supplement tolerance  Reason for Assessment: Progression Rounds  80 y.o. male  Admitting Dx: s/p fall at home; large meningioma  ASSESSMENT: PMHx significant for GERD, HTN, HLD, large meningioma with planned craniotomy. Admitted s/p fall at home. Work-up reveals gait instability/dizziness 2/2 mass and suspected orthostasis.  Pt with planned craniotomy on 1/9, per neurosurgery, unable to operate sooner. Discussed pt during progression rounds, anticipate upcoming lengthy hospitalization, ICU stay; pt with current poor oral intake and would benefit from RD assessment, per team.  Pt reports that he has been getting progressively weaker and has had frequent falls.  He reports that his appetite has been great PTA, has always been a "good eater" and has maintained his weight of 200 lb since as long as he can remember. He reports that he eats a bowl of cereal for breakfast, a sandwich for lunch and whatever his wife makes for dinner.   Pt reports that since he's been in the hospital (5 days now), he has been eating less. He doesn't really care for the foods here, even though he is on a Regular diet. Per doc flowsheets, pt is consuming 25-50% of meals. He waits for his wife to arrive to eat, breakfast tray still untouched at this time (1030). He states that he has been in this hospital bed for 5  days and feels himself getting weaker. We discussed the importance of eating for strength and in preparation for his craniotomy on Friday. Pt verbalized understanding and is agreeable to drinking a chocolate Ensure Complete daily.  Nutrition Focused Physical Exam:  Subcutaneous Fat:  Orbital Region: WNL Upper Arm Region: WNL Thoracic and Lumbar Region: WNL  Muscle:  Temple Region: mild depletion Clavicle Bone Region: WNL Clavicle and Acromion Bone Region: WNL Scapular Bone Region: WNL Dorsal Hand: WNL Patellar Region: WNL Anterior Thigh Region: WNL Posterior Calf Region: WNL  Edema: n/a  Pt with constipation, unable to have BM x 7 days as of yesterday; ordered for Golytely and had a "very large" BM today per nurse.  Currently, weight is 188 lb. Pt reports that he doesn't think that is accurate. RD re-weighed pt in bed (several pillows and blankets) and he weighed 191 lb. He then thinks that maybe he has lost weight 2/2 his very large BM he had this morning.  Pt meets criteria for moderate MALNUTRITION in the context of acute illness as evidenced by mild fat depletion, wt loss of 8% in <1 month, and intake of <75% x 7 days.   Sodium low at 136 and trending down. Currently on droplet precautions, pending influenza PCR.   Height: Ht Readings from Last 1 Encounters:  09/20/13 5\' 9"  (1.753 m)    Weight: Wt Readings from Last 1 Encounters:  09/22/13 188 lb 11.4 oz (85.6 kg)    Ideal Body Weight: 160 lb/72.7 kg  % Ideal Body Weight: 118%  Wt Readings from Last 10 Encounters:  09/22/13 188 lb 11.4 oz (85.6  kg)  09/18/13 208 lb (94.348 kg)  09/02/13 211 lb (95.709 kg)  08/20/13 210 lb (95.255 kg)  10/08/12 194 lb 14.2 oz (88.4 kg)    Usual Body Weight: 200 lb  % Usual Body Weight: 94%  BMI:  Body mass index is 27.86 kg/(m^2). Overweight  Estimated Nutritional Needs: Kcal: 1875 - 2050 Protein: at least 100 grams daily Fluid: 1.8 - 2 liters  Skin: intact  Diet  Order: General  EDUCATION NEEDS: -No education needs identified at this time   Intake/Output Summary (Last 24 hours) at 09/24/13 0957 Last data filed at 09/23/13 2237  Gross per 24 hour  Intake 3157.5 ml  Output    800 ml  Net 2357.5 ml    Last BM: 1/6  Labs:   Recent Labs Lab 09/21/13 0440 09/22/13 0410 09/24/13 0528  NA 137 136* 136*  K 4.2 4.3 3.9  CL 99 102 100  CO2 26 24 25   BUN 35* 24* 16  CREATININE 1.23 0.99 0.93  CALCIUM 8.7 8.3* 8.0*  GLUCOSE 110* 97 85    CBG (last 3)  No results found for this basename: GLUCAP,  in the last 72 hours  Scheduled Meds: . beta carotene w/minerals  1 tablet Oral Daily  . darifenacin  7.5 mg Oral Daily  . enoxaparin (LOVENOX) injection  40 mg Subcutaneous Daily  . ferrous sulfate  325 mg Oral Q breakfast  . lidocaine  1 patch Transdermal Q24H  . pantoprazole  40 mg Oral Daily  . polyethylene glycol  17 g Oral Daily  . rOPINIRole  1 mg Oral Q1500   And  . rOPINIRole  1 mg Oral QHS  . saccharomyces boulardii  250 mg Oral Daily  . simvastatin  40 mg Oral QPM  . tamsulosin  0.4 mg Oral QPC supper    Continuous Infusions: . sodium chloride 75 mL/hr at 09/24/13 0425    Past Medical History  Diagnosis Date  . Anemia   . Prostate cancer   . Basal cell cancer   . Hypertension   . Hyperlipidemia   . Restless leg   . GERD (gastroesophageal reflux disease)   . Brain tumor     Past Surgical History  Procedure Laterality Date  . Joint replacement      right knee replacement  . Bilateral cateract surgery    . Vasectomy    . Tonsillectomy    . Colonoscopy      colon polyp removed    Inda Coke MS, RD, LDN Pager: [phone] After-hours pager: [phone]

## 2013-09-24 NOTE — Clinical Social Work Note (Signed)
[employer] Medicare authorization process for SNF started today.   Liz Beach, [place], [place], [phone]

## 2013-09-24 NOTE — Clinical Social Work Placement (Signed)
Clinical Social Work Department CLINICAL SOCIAL WORK PLACEMENT NOTE 09/24/2013  Patient:  [name], [name]  Account Number:  [id number] [place] date:  09/19/2013  Clinical Social Worker:  Kemper Durie, [place]  Date/time:  09/23/2013 04:00 PM  Clinical Social Work is seeking post-discharge placement for this patient at the following level of care:   [place]   (*CSW will update this form in Epic as items are completed)   09/23/2013  Patient/family provided with [place] Department of Clinical Social Work's list of facilities offering this level of care within the geographic area requested by the patient (or if unable, by the patient's family).  09/23/2013  Patient/family informed of their freedom to choose among providers that offer the needed level of care, that participate in Medicare, Medicaid or managed care program needed by the patient, have an available bed and are willing to accept the patient.  09/23/2013  Patient/family informed of [facility]' ownership interest in [facility], as well as of the fact that they are under no obligation to receive care at this facility.  PASARR submitted to EDS on 09/23/2013 PASARR number received from EDS on 09/23/2013  FL2 transmitted to all facilities in geographic area requested by pt/family on  09/23/2013 FL2 transmitted to all facilities within larger geographic area on   Patient informed that his/her managed care company has contracts with or will negotiate with  certain facilities, including the following:     Patient/family informed of bed offers received:  09/24/2013 Patient chooses bed at [facility] Physician recommends and patient chooses bed at    Patient to be transferred to  on   Patient to be transferred to facility by   The following physician request were entered in Epic:   Additional Comments:   Liz Beach, [place], [phone]

## 2013-09-25 DIAGNOSIS — Y92009 Unspecified place in unspecified non-institutional (private) residence as the place of occurrence of the external cause: Secondary | ICD-10-CM

## 2013-09-25 LAB — BASIC METABOLIC PANEL
BUN: 14 mg/dL (ref 6–23)
CO2: 25 mEq/L (ref 19–32)
Calcium: 8.2 mg/dL — ABNORMAL LOW (ref 8.4–10.5)
Chloride: 102 mEq/L (ref 96–112)
Creatinine, Ser: 0.95 mg/dL (ref 0.50–1.35)
GFR calc Af Amer: 89 mL/min — ABNORMAL LOW (ref >90)
GFR calc non Af Amer: 77 mL/min — ABNORMAL LOW (ref >90)
Glucose, Bld: 105 mg/dL — ABNORMAL HIGH (ref 70–99)
Potassium: 3.7 mEq/L (ref 3.7–5.3)
Sodium: 137 mEq/L (ref 137–147)

## 2013-09-25 LAB — CBC
HCT: 35.2 % — ABNORMAL LOW (ref 39.0–52.0)
Hemoglobin: 11.8 g/dL — ABNORMAL LOW (ref 13.0–17.0)
MCH: 32.6 pg (ref 26.0–34.0)
MCHC: 33.5 g/dL (ref 30.0–36.0)
MCV: 97.2 fL (ref 78.0–100.0)
Platelets: 153 10*3/uL (ref 150–400)
RBC: 3.62 MIL/uL — ABNORMAL LOW (ref 4.22–5.81)
RDW: 13.3 % (ref 11.5–15.5)
WBC: 7.1 10*3/uL (ref 4.0–10.5)

## 2013-09-25 LAB — CULTURE, BLOOD (ROUTINE X 2)

## 2013-09-25 MED ORDER — DOCUSATE SODIUM 100 MG PO CAPS
100.0000 mg | ORAL_CAPSULE | Freq: Every day | ORAL | Status: DC
  Administered 2013-09-26 – 2013-10-03 (×7): 100 mg via ORAL
  Filled 2013-09-25 (×8): qty 1

## 2013-09-25 NOTE — Progress Notes (Signed)
Physical Therapy Treatment Note   09/25/13 0829  PT Visit Information  Last PT Received On 09/25/13  Assistance Needed +2  History of Present Illness Pt is 80 y.o. male with PMH of GERD, HTN, hyperlipidemia, large meningioma with planned craniotomy in the near future who presets to [facility] ED after an episode of fall at home several hours PTA. Pt explains he has been progressively weaker and has had frequent falls, today he fell on his chest. His pain is no in the anterior chest area, throbbing and persistent, 5/10 in severity, non radiating, worse with movement. He denies shortness of breath, no specific abdominal or urinary concerns. No LOC, no headaches, no visual changes.    PT Time Calculation  PT Start Time [phone]  PT Stop Time 0900  PT Time Calculation (min) 31 min  Precautions  Precautions Fall  Precaution Comments left leg and arm weakness, dyscoordination, and increased tone.    Restrictions  Weight Bearing Restrictions No  Cognition  Arousal/Alertness Awake/alert  Behavior During Therapy WFL for tasks assessed/performed  Overall Cognitive Status Within Functional Limits for tasks assessed  Bed Mobility  Overal bed mobility +2 for physical assistance  General bed mobility comments unable to use L UE/LE functionally  Transfers  Overall transfer level Needs assistance  Equipment used 2 person hand held assist  Transfers Sit to/from Stand;Stand Pivot Transfers  Sit to Stand +2 physical assistance;Max assist  Stand pivot transfers +2 physical assistance;Max assist  General transfer comment maxA to facilitate R LE advancement and L LE advancement. comleted 2 sit to stands. Pt  Balance  Overall balance assessment Needs assistance  Sitting-balance support Feet supported;Single extremity supported (R UE)  Sitting balance-Leahy Scale Fair  Standing balance support Bilateral upper extremity supported  Standing balance-Leahy Scale Poor  Exercises  Exercises (completed PROM to L UE/LE to  assist with tone management)  PT - End of Session  Equipment Utilized During Treatment Gait belt  Activity Tolerance Patient tolerated treatment well;Patient limited by fatigue  Patient left in chair;with call bell/phone within reach;with family/visitor present  Nurse Communication Mobility status;Need for lift equipment  PT - Assessment/Plan  PT Plan Current plan remains appropriate  PT Frequency Min 3X/week  Recommendations for Other Services Rehab consult  Follow Up Recommendations CIR  PT equipment Wheelchair (measurements PT);Wheelchair cushion (measurements PT);Hospital bed  PT Goal Progression  Progress towards PT goals Progressing toward goals  PT General Charges  $$ ACUTE PT VISIT 1 Procedure  PT Treatments  $Therapeutic Activity 8-22 mins  $Neuromuscular Re-education 8-22 mins   Pt con't to have L side weakness and increased tone. Pt planned for craniotomy this Friday. PT to re-assess pt mobility when appropriate s/p surgery. Pt con't to be motivated and participatory with therapy.  Kittie Plater, PT, DPT Pager #: [phone] Office #: [phone]

## 2013-09-25 NOTE — Progress Notes (Signed)
[facility] PROGRESS NOTE  [name]. ZOX:[id number] DOB: May 25, 1931 DOA: 09/19/2013 PCP:  Melinda Crutch, MD  HPI/Subjective:  [name] is an 80 yo white male that that has a PMH of menigioma, HTN, gait instability, and orthostasis.  He was brought to ED due to fall at home.  Anterior wall chest pain seems to be trending down today from that of admission, but he still has acute pain when rolling over in bed.  His incentive spirometry is progressing. He continues to have decreased grip strength in left hand (1/5) and no movement of left leg.   He is simply waiting for his craniotomy on Friday 1/9 for removal of brain mass.    Assessment/Plan: 1. Brain mass: Due to meningioma on right side of brain. Occluding superior sagittal sinus over a short segment  -Gait instability: Likely do to brain mass. PT evaluated and recommended potential CIR.  -Have craniotomy on Friday, 1/9             -This is causing left side hemiplegia, likely from the meningioma pressing on the motor cortex.  2. Anterior chest wall pain: Likely due to fall  - Continue lidocaine patch and monitor for pain improvement  - Continue PT for fall prophylaxis   3. Orthostasis: Likely due to dehydration  - Monitor fluids and improvement upon ambulation. KVO  4. Possible Atelectasis:  - CXR shows lower lobe infiltrates, but shows no clinical signs of PNA.  Continue incentive spirometry and monitor for symptom improvement  - No ABX at this time  5. Constipation prophylaxis:  - Colace is being given daily instead of PRN  6. Acute on CKD- Resolved with IVF.  Stable.  Creatinine normalized.  7. HTN- Stable pressures; presently off antihypertensive meds  8. Restless leg syndrome-  meds adjusted to usual home regimen   DVT Prophylaxis:   Lovenox 40mg  [place], daily  Code Status: Full Family Communication: Wife was at present encounter Disposition Plan:  Inpatient   Consultants:  Neurosurgery  Procedures:    Antibiotics:  Levaquin one dose 1/5  Objective: Filed Vitals:   09/24/13 0533 09/24/13 2110 09/25/13 0414 09/25/13 0501  BP: 120/67 133/76  136/80  Pulse: 80 72  69  Temp: 98.2 F (36.8 C) 98.7 F (37.1 C)  97.5 F (36.4 C)  TempSrc: Oral Oral  Oral  Resp: 18 18  18   Height:      Weight:   85.548 kg (188 lb 9.6 oz)   SpO2: 98% 98%  92%   Filed Weights   09/21/13 0329 09/22/13 0452 09/25/13 0414  Weight: 94 kg (207 lb 3.7 oz) 85.6 kg (188 lb 11.4 oz) 85.548 kg (188 lb 9.6 oz)    Exam: General: WDWN, NAD, appears stated age.  Cardiovascular: RRR, S1 S2 auscultated, no rubs, murmurs or gallops.   Respiratory: Left lower lobe has rales with inspiration Extremities: Show mild edema of R lower leg.  Neuro: Grip strength 5/5 in R hand and 1/5 in Left. Sensation intact to lower and upper extremities. Movement of R leg. No movement of L leg    Psych: Normal affect and demeanor with intact judgement and insight   Data Reviewed: Basic Metabolic Panel:  Recent Labs Lab 09/20/13 0619 09/21/13 0440 09/22/13 0410 09/24/13 0528 09/25/13 0425  NA 141 137 136* 136* 137  K 4.5 4.2 4.3 3.9 3.7  CL 102 99 102 100 102  CO2 26 26 24 25 25   GLUCOSE 96 110* 97  85 105*  BUN 25* 35* 24* 16 14  CREATININE 1.27 1.23 0.99 0.93 0.95  CALCIUM 9.1 8.7 8.3* 8.0* 8.2*   CBC:  Recent Labs Lab 09/20/13 0207 09/20/13 0619 09/21/13 0440 09/22/13 0905 09/24/13 0528 09/25/13 0425  WBC 12.0* 11.6* 15.2* 10.3 7.4 7.1  NEUTROABS 9.8*  --   --   --   --   --   HGB 15.6 14.8 14.6 13.3 12.1* 11.8*  HCT 45.2 43.3 43.5 40.6 35.6* 35.2*  MCV 96.4 97.3 99.3 98.3 97.3 97.2  PLT 199 185 165 136* 160 153     Recent Results (from the past 240 hour(s))  SURGICAL PCR SCREEN     Status: None   Collection Time    09/18/13 11:31 AM      Result Value Range Status   MRSA, PCR NEGATIVE  NEGATIVE Final   Staphylococcus aureus NEGATIVE   NEGATIVE Final   Comment:            The Xpert SA Assay (FDA     approved for NASAL specimens     in patients over 49 years of age),     is one component of     a comprehensive surveillance     program.  Test performance has     been validated by [employer] for patients greater     than or equal to 18 year old.     It is not intended     to diagnose infection nor to     guide or monitor treatment.  CULTURE, BLOOD (ROUTINE X 2)     Status: None   Collection Time    09/21/13  7:42 PM      Result Value Range Status   Specimen Description BLOOD LEFT HAND   Final   Special Requests BOTTLES DRAWN AEROBIC ONLY 10CC   Final   Culture  Setup Time     Final   Value: 09/22/2013 01:27     Performed at [facility]   Culture     Final   Value:        BLOOD CULTURE RECEIVED NO GROWTH TO DATE CULTURE WILL BE HELD FOR 5 DAYS BEFORE ISSUING A FINAL NEGATIVE REPORT     Performed at [facility]   Report Status PENDING   Incomplete  CULTURE, BLOOD (ROUTINE X 2)     Status: None   Collection Time    09/21/13  7:46 PM      Result Value Range Status   Specimen Description BLOOD RIGHT ANTECUBITAL   Final   Special Requests BOTTLES DRAWN AEROBIC AND ANAEROBIC 10CC EA   Final   Culture  Setup Time     Final   Value: 09/22/2013 01:26     Performed at [facility]   Culture     Final   Value: GRAM POSITIVE COCCI IN CLUSTERS     Note: Gram Stain Report Called to,Read Back By and Verified With: Glade Lloyd RN on 09/23/13 at 02:05 by Rise Mu     Performed at [facility]   Report Status PENDING   Incomplete     Studies: Dg Chest Port 1 View  09/24/2013   CLINICAL DATA:  Pneumonia.  EXAM: PORTABLE CHEST - 1 VIEW  COMPARISON:  09/22/2013.  FINDINGS: Poor inspiration with mild bibasilar atelectasis versus infiltrates. These are stable. Stable cardiomegaly with normal pulmonary vascularity. No pneumothorax. No pleural effusion. No acute osseous  abnormality.   IMPRESSION: Persistent basilar atelectasis and/or mild pneumonia. Chest is stable from prior exam.   Electronically Signed   By: Marcello Moores  Register   On: 09/24/2013 09:50    Scheduled Meds: . beta carotene w/minerals  1 tablet Oral Daily  . darifenacin  7.5 mg Oral Daily  . docusate sodium  100 mg Oral Daily  . enoxaparin (LOVENOX) injection  40 mg Subcutaneous Daily  . feeding supplement (ENSURE COMPLETE)  237 mL Oral Q24H  . ferrous sulfate  325 mg Oral Q breakfast  . lidocaine  1 patch Transdermal Q24H  . pantoprazole  40 mg Oral Daily  . polyethylene glycol  17 g Oral Daily  . rOPINIRole  1 mg Oral Q1500   And  . rOPINIRole  1 mg Oral QHS  . saccharomyces boulardii  250 mg Oral Daily  . simvastatin  40 mg Oral QPM  . tamsulosin  0.4 mg Oral QPC supper   Continuous Infusions:   Principal Problem:   Brain mass Active Problems:   Orthostasis   Gait instability   Fall at home   Hypertension   Restless leg syndrome   Dizziness   Dehydration   CKD (chronic kidney disease), stage III    Lucile Shutters, PA-S  [facility] Pager [phone]. If 7PM-7AM, please contact night-coverage at [email or web address], password [facility] 09/25/2013, 9:52 AM  LOS: 6 days      Addendum  Patient seen and examined, chart and data base reviewed.  I agree with the above assessment and plan.  For full details please see Mr. Lucile Shutters, PA-S note.  I reviewed and addended the above note.   Birdie Hopes, MD [facility] Pager: [phone] 09/25/2013, 3:32 PM

## 2013-09-26 ENCOUNTER — Inpatient Hospital Stay (HOSPITAL_COMMUNITY): Payer: Medicare Other

## 2013-09-26 ENCOUNTER — Ambulatory Visit: Payer: Medicare Other

## 2013-09-26 DIAGNOSIS — K5900 Constipation, unspecified: Secondary | ICD-10-CM | POA: Diagnosis present

## 2013-09-26 DIAGNOSIS — M7989 Other specified soft tissue disorders: Secondary | ICD-10-CM

## 2013-09-26 DIAGNOSIS — D509 Iron deficiency anemia, unspecified: Secondary | ICD-10-CM | POA: Diagnosis present

## 2013-09-26 MED ORDER — FLEET ENEMA 7-19 GM/118ML RE ENEM
1.0000 | ENEMA | Freq: Once | RECTAL | Status: AC
  Administered 2013-09-26: 1 via RECTAL
  Filled 2013-09-26 (×2): qty 1

## 2013-09-26 NOTE — Progress Notes (Signed)
Patient resting in bed with eyes closed. Needs met.

## 2013-09-26 NOTE — Progress Notes (Signed)
*  Preliminary Results* Left lower extremity venous duplex completed. Left lower extremity is negative for deep vein thrombosis. There is no evidence of left Baker's cyst.  09/26/2013 3:24 PM  Maudry Mayhew, RVT, RDCS, RDMS

## 2013-09-26 NOTE — Clinical Social Work Note (Signed)
CSW met with wife at bedside. CSW explained that patient may not come back to [facility] after procedure tomorrow and that she and her husband may have a new Education officer, museum. CSW answered wife's questions and explained that the details of her husband's case will be relayed to new social worker, if they are moved to a new unit after procedure tomorrow morning. Wife is still hopeful for CIR placement. CSW informed wife that [facility] has bed for patient. Wife wanted [facility], but said [facility] is second preference. [facility] does not currently have a bed. Insurance authorization has been started. New clinicals will need to be sent to [facility].  Liz Beach, [place], [place], [phone]

## 2013-09-26 NOTE — Care Management Note (Unsigned)
    Page 1 of 1   09/26/2013     3:53:12 PM   CARE MANAGEMENT NOTE 09/26/2013  Patient:  [name], [name]   Account Number:  [id number]  Date Initiated:  09/26/2013  Documentation initiated by:  Tomi Bamberger  Subjective/Objective Assessment:   dx fall, dizziness, menigionma  admit- lives with spouse.     Action/Plan:   Anticipated DC Date:  10/01/2013   Anticipated DC Plan:  IP REHAB FACILITY  In-house referral  Clinical Social Worker      DC Planning Services  CM consult      Choice offered to / List presented to:             Status of service:  In process, will continue to follow Medicare Important Message given?   (If response is "NO", the following Medicare IM given date fields will be blank) Date Medicare IM given:   Date Additional Medicare IM given:    Discharge Disposition:    Per UR Regulation:  Reviewed for med. necessity/level of care/duration of stay  If discussed at [place] of Stay Meetings, dates discussed:   09/24/2013  09/26/2013    Comments:  09/26/13 15:48 Tomi Bamberger RN, BSN [phone] patient lives with spouse, patient s/p fall, has menigioma- which is causing gait instability and left side hemiplegia, patient scheduled for craniotomy on 09/27/13.  Physical therapy has recommended CIR for patient, physcial therapy will reassess patient agan after surgery.  NCM will continue to follow for dc needs.

## 2013-09-26 NOTE — Progress Notes (Signed)
[facility] PROGRESS NOTE  [name]. [id number] DOB: Feb 10, 1931 DOA: 09/19/2013 PCP:  Melinda Crutch, MD  HPI/Subjective: [name] is an 80 yo white male that that has a PMH of menigioma, HTN, gait instability, and orthostasis. He was brought to ED due to fall at home. Anterior wall chest pain seems to be trending down today from that of admission.  His incentive spirometry is progressing. He has no grip strength in left hand and no movement of left leg. Pt. Complains of sharp pain in left hip area around midnight of last night and again upon assisted movement of left leg by provider. He states he feels the pain run up his L thigh to his hip.   He is waiting for his craniotomy on Friday 1/9 for removal of brain mass.    Assessment/Plan:   Brain mass: Due to meningioma on right side of brain. Occluding superior sagittal sinus over a short segment  -Gait instability: Likely do to brain mass. PT evaluated and recommended potential CIR.  -Have craniotomy on Friday, 1/9 . Stable from a pulmonary standpoint for surgery -This is causing left side hemiplegia, likely from the meningioma pressing on the motor cortex.  - L hip pain is possibly do to meningioma, but could be from bed stasis.   Xray of L hip was performed with no fx or acute findings - Doppler US of left lower leg for possible DVT was performed with negative findings of DVT    Anterior chest wall pain: Likely due to fall  - Continue lidocaine patch and monitor for pain improvement  - Continue PT for fall prophylaxis   Anemia - Normocytic anemia. Hemoglobin has trended down from admission date. Appears to have a baseline of 10   Orthostasis: Likely due to dehydration  - Monitor fluids and improvement upon ambulation. KVO   Possible Atelectasis:  - CXR shows lower lobe infiltrates, but shows no clinical signs of PNA. Continue incentive spirometry and monitor for symptom improvement  - No ABX at this time    Constipation prophylaxis:  - Colace is being given daily instead of PRN  -Will order a Fleet enema and prune juice     Acute on CKD- Resolved with IVF. Stable. Creatinine normalized.   HTN- Stable pressures; presently off antihypertensive meds   Restless leg syndrome- meds adjusted to usual home regimen     DVT Prophylaxis:  SVDs  Code Status: Full Family Communication: Wife at bedside Disposition Plan: Inpatient   Consultants:  Neuro  Procedures:  Doppler 1/8  Antibiotics:  None  Objective: Filed Vitals:   09/25/13 0501 09/25/13 1324 09/25/13 2049 09/26/13 0520  BP: 136/80 126/78 133/77 133/80  Pulse: 69 65 76 78  Temp: 97.5 F (36.4 C) 97.5 F (36.4 C) 98.8 F (37.1 C) 97.9 F (36.6 C)  TempSrc: Oral Oral Oral Oral  Resp: 18 18 20 20   Height:      Weight:    87 kg (191 lb 12.8 oz)  SpO2: 92% 97% 96% 96%    Intake/Output Summary (Last 24 hours) at 09/26/13 1034 Last data filed at 09/26/13 0525  Gross per 24 hour  Intake    240 ml  Output   2725 ml  Net  -2485 ml   Filed Weights   09/22/13 0452 09/25/13 0414 09/26/13 0520  Weight: 85.6 kg (188 lb 11.4 oz) 85.548 kg (188 lb 9.6 oz) 87 kg (191 lb 12.8 oz)    Exam: General: Well  developed, well nourished, NAD, appears stated age  10:  PERR, EOMI, Anicteic Sclera, MMM. No pharyngeal erythema or exudates  Neck: Supple, no JVD, no masses  Cardiovascular: RRR, S1 S2 auscultated, no rubs, murmurs or gallops.   Respiratory: Clear to auscultation bilaterally with equal chest rise  Abdomen: Soft, nontender, nondistended, + bowel sounds  Extremities: warm dry without cyanosis clubbing or edema.  Neuro: AAOx3, cranial nerves grossly intact.  No grip strength in L hand. 5/5 in Right. 2/5 strength in left leg Skin: Stage 1 sacral erythema  Psych: Normal affect and demeanor with intact judgement and insight   Data Reviewed: Basic Metabolic Panel:  Recent Labs Lab 09/20/13 0619 09/21/13 0440  09/22/13 0410 09/24/13 0528 09/25/13 0425  NA 141 137 136* 136* 137  K 4.5 4.2 4.3 3.9 3.7  CL 102 99 102 100 102  CO2 26 26 24 25 25   GLUCOSE 96 110* 97 85 105*  BUN 25* 35* 24* 16 14  CREATININE 1.27 1.23 0.99 0.93 0.95  CALCIUM 9.1 8.7 8.3* 8.0* 8.2*   Liver Function Tests:  CBC:  Recent Labs Lab 09/20/13 0207 09/20/13 0619 09/21/13 0440 09/22/13 0905 09/24/13 0528 09/25/13 0425  WBC 12.0* 11.6* 15.2* 10.3 7.4 7.1  NEUTROABS 9.8*  --   --   --   --   --   HGB 15.6 14.8 14.6 13.3 12.1* 11.8*  HCT 45.2 43.3 43.5 40.6 35.6* 35.2*  MCV 96.4 97.3 99.3 98.3 97.3 97.2  PLT 199 185 165 136* 160 153   Cardiac Enzymes:   Recent Results (from the past 240 hour(s))  SURGICAL PCR SCREEN     Status: None   Collection Time    09/18/13 11:31 AM      Result Value Range Status   MRSA, PCR NEGATIVE  NEGATIVE Final   Staphylococcus aureus NEGATIVE  NEGATIVE Final   Comment:            The Xpert SA Assay (FDA     approved for NASAL specimens     in patients over 29 years of age),     is one component of     a comprehensive surveillance     program.  Test performance has     been validated by [employer] for patients greater     than or equal to 38 year old.     It is not intended     to diagnose infection nor to     guide or monitor treatment.  CULTURE, BLOOD (ROUTINE X 2)     Status: None   Collection Time    09/21/13  7:42 PM      Result Value Range Status   Specimen Description BLOOD LEFT HAND   Final   Special Requests BOTTLES DRAWN AEROBIC ONLY 10CC   Final   Culture  Setup Time     Final   Value: 09/22/2013 01:27     Performed at [facility]   Culture     Final   Value:        BLOOD CULTURE RECEIVED NO GROWTH TO DATE CULTURE WILL BE HELD FOR 5 DAYS BEFORE ISSUING A FINAL NEGATIVE REPORT     Performed at [facility]   Report Status PENDING   Incomplete  CULTURE, BLOOD (ROUTINE X 2)     Status: None   Collection Time    09/21/13  7:46 PM       Result Value  Range Status   Specimen Description BLOOD RIGHT ANTECUBITAL   Final   Special Requests BOTTLES DRAWN AEROBIC AND ANAEROBIC 10CC EA   Final   Culture  Setup Time     Final   Value: 09/22/2013 01:26     Performed at [facility]   Culture     Final   Value: MICROCOCCUS SPECIES     Note: Standardized susceptibility testing for this organism is not available.     Note: Gram Stain Report Called to,Read Back By and Verified With: Glade Lloyd RN on 09/23/13 at 02:05 by Rise Mu     Performed at [facility]   Report Status 09/25/2013 FINAL   Final     Studies: No results found.  Scheduled Meds: . beta carotene w/minerals  1 tablet Oral Daily  . darifenacin  7.5 mg Oral Daily  . docusate sodium  100 mg Oral Daily  . enoxaparin (LOVENOX) injection  40 mg Subcutaneous Daily  . feeding supplement (ENSURE COMPLETE)  237 mL Oral Q24H  . ferrous sulfate  325 mg Oral Q breakfast  . lidocaine  1 patch Transdermal Q24H  . pantoprazole  40 mg Oral Daily  . polyethylene glycol  17 g Oral Daily  . rOPINIRole  1 mg Oral Q1500   And  . rOPINIRole  1 mg Oral QHS  . saccharomyces boulardii  250 mg Oral Daily  . simvastatin  40 mg Oral QPM  . tamsulosin  0.4 mg Oral QPC supper   Continuous Infusions:   Principal Problem:   Brain mass Active Problems:   Orthostasis   Gait instability   Fall at home   Hypertension   Restless leg syndrome   Dizziness   Dehydration   CKD (chronic kidney disease), stage III    Lucile Shutters, PA-S Imogene Burn, PA-C [facility] Pager [phone]. If 7PM-7AM, please contact night-coverage at [email or web address], password [facility] 09/26/2013, 10:34 AM  LOS: 7 days

## 2013-09-27 ENCOUNTER — Encounter (HOSPITAL_COMMUNITY)
Admission: EM | Disposition: A | Discharge status: Rehabilitation Facility | Payer: Self-pay | Source: Home / Self Care | Attending: Neurosurgery

## 2013-09-27 ENCOUNTER — Inpatient Hospital Stay (HOSPITAL_COMMUNITY): Admission: RE | Admit: 2013-09-27 | Payer: Medicare Other | Source: Ambulatory Visit | Admitting: Neurosurgery

## 2013-09-27 ENCOUNTER — Encounter (HOSPITAL_COMMUNITY): Payer: Medicare Other | Admitting: Certified Registered"

## 2013-09-27 ENCOUNTER — Inpatient Hospital Stay (HOSPITAL_COMMUNITY): Payer: Medicare Other

## 2013-09-27 ENCOUNTER — Encounter (HOSPITAL_COMMUNITY): Payer: Self-pay | Admitting: Certified Registered"

## 2013-09-27 ENCOUNTER — Inpatient Hospital Stay (HOSPITAL_COMMUNITY): Payer: Medicare Other | Admitting: Certified Registered"

## 2013-09-27 DIAGNOSIS — D320 Benign neoplasm of cerebral meninges: Secondary | ICD-10-CM | POA: Diagnosis present

## 2013-09-27 HISTORY — PX: CRANIOTOMY: SHX93

## 2013-09-27 LAB — CBC
HCT: 38 % — ABNORMAL LOW (ref 39.0–52.0)
HEMOGLOBIN: 12.9 g/dL — AB (ref 13.0–17.0)
MCH: 32.9 pg (ref 26.0–34.0)
MCHC: 33.9 g/dL (ref 30.0–36.0)
MCV: 96.9 fL (ref 78.0–100.0)
PLATELETS: 186 10*3/uL (ref 150–400)
RBC: 3.92 MIL/uL — ABNORMAL LOW (ref 4.22–5.81)
RDW: 13.3 % (ref 11.5–15.5)
WBC: 7.7 10*3/uL (ref 4.0–10.5)

## 2013-09-27 SURGERY — CRANIOTOMY TUMOR EXCISION
Anesthesia: General | Laterality: Right

## 2013-09-27 MED ORDER — DEXAMETHASONE SODIUM PHOSPHATE 4 MG/ML IJ SOLN
INTRAMUSCULAR | Status: DC | PRN
  Administered 2013-09-27: 10 mg via INTRAVENOUS

## 2013-09-27 MED ORDER — LABETALOL HCL 5 MG/ML IV SOLN: 10.0000 mg | INTRAVENOUS | Status: DC | PRN

## 2013-09-27 MED ORDER — SODIUM CHLORIDE 0.9 % IV SOLN
INTRAVENOUS | Status: DC | PRN
  Administered 2013-09-27 (×2): via INTRAVENOUS

## 2013-09-27 MED ORDER — DEXAMETHASONE SODIUM PHOSPHATE 4 MG/ML IJ SOLN
4.0000 mg | Freq: Three times a day (TID) | INTRAMUSCULAR | Status: DC
  Administered 2013-09-29 – 2013-10-01 (×5): 4 mg via INTRAVENOUS
  Filled 2013-09-27 (×8): qty 1

## 2013-09-27 MED ORDER — PROPOFOL 10 MG/ML IV BOLUS
INTRAVENOUS | Status: DC | PRN
  Administered 2013-09-27: 40 mg via INTRAVENOUS
  Administered 2013-09-27: 100 mg via INTRAVENOUS

## 2013-09-27 MED ORDER — SODIUM CHLORIDE 0.9 % IV SOLN
INTRAVENOUS | Status: DC | PRN
  Administered 2013-09-27 (×2): via INTRAVENOUS

## 2013-09-27 MED ORDER — GLYCOPYRROLATE 0.2 MG/ML IJ SOLN
INTRAMUSCULAR | Status: DC | PRN
  Administered 2013-09-27: .6 mg via INTRAVENOUS

## 2013-09-27 MED ORDER — CEFAZOLIN SODIUM-DEXTROSE 2-3 GM-% IV SOLR
INTRAVENOUS | Status: DC | PRN
  Administered 2013-09-27 (×2): 2 g via INTRAVENOUS

## 2013-09-27 MED ORDER — ARTIFICIAL TEARS OP OINT
TOPICAL_OINTMENT | OPHTHALMIC | Status: DC | PRN
  Administered 2013-09-27: 1 via OPHTHALMIC

## 2013-09-27 MED ORDER — SODIUM CHLORIDE 0.9 % IV BOLUS (SEPSIS)
500.0000 mL | Freq: Once | INTRAVENOUS | Status: AC
  Administered 2013-09-27: 500 mL via INTRAVENOUS

## 2013-09-27 MED ORDER — MANNITOL 25 % IV SOLN
INTRAVENOUS | Status: DC | PRN
  Administered 2013-09-27 (×2): 5 g via INTRAVENOUS
  Administered 2013-09-27: 1 g via INTRAVENOUS
  Administered 2013-09-27: 3 g via INTRAVENOUS
  Administered 2013-09-27 (×4): 5 g via INTRAVENOUS
  Administered 2013-09-27 (×2): 3 g via INTRAVENOUS
  Administered 2013-09-27 (×2): 5 g via INTRAVENOUS

## 2013-09-27 MED ORDER — 0.9 % SODIUM CHLORIDE (POUR BTL) OPTIME
TOPICAL | Status: DC | PRN
  Administered 2013-09-27 (×3): 1000 mL

## 2013-09-27 MED ORDER — MIDAZOLAM HCL 5 MG/5ML IJ SOLN
INTRAMUSCULAR | Status: DC | PRN
  Administered 2013-09-27: 1 mg via INTRAVENOUS

## 2013-09-27 MED ORDER — ONDANSETRON HCL 4 MG/2ML IJ SOLN
INTRAMUSCULAR | Status: DC | PRN
  Administered 2013-09-27: 4 mg via INTRAVENOUS

## 2013-09-27 MED ORDER — ROCURONIUM BROMIDE 100 MG/10ML IV SOLN
INTRAVENOUS | Status: DC | PRN
  Administered 2013-09-27: 30 mg via INTRAVENOUS
  Administered 2013-09-27: 10 mg via INTRAVENOUS
  Administered 2013-09-27: 50 mg via INTRAVENOUS
  Administered 2013-09-27: 20 mg via INTRAVENOUS

## 2013-09-27 MED ORDER — SODIUM CHLORIDE 0.9 % IV SOLN
10.0000 mg | INTRAVENOUS | Status: DC | PRN
  Administered 2013-09-27: 5 ug/min via INTRAVENOUS

## 2013-09-27 MED ORDER — DEXAMETHASONE SODIUM PHOSPHATE 10 MG/ML IJ SOLN
6.0000 mg | Freq: Four times a day (QID) | INTRAMUSCULAR | Status: AC
  Administered 2013-09-27 – 2013-09-28 (×3): 6 mg via INTRAVENOUS
  Filled 2013-09-27: qty 0.6
  Filled 2013-09-27 (×2): qty 1

## 2013-09-27 MED ORDER — FENTANYL CITRATE 0.05 MG/ML IJ SOLN
INTRAMUSCULAR | Status: DC | PRN
  Administered 2013-09-27: 25 ug via INTRAVENOUS
  Administered 2013-09-27: 50 ug via INTRAVENOUS
  Administered 2013-09-27: 25 ug via INTRAVENOUS
  Administered 2013-09-27: 150 ug via INTRAVENOUS

## 2013-09-27 MED ORDER — NEOSTIGMINE METHYLSULFATE 1 MG/ML IJ SOLN
INTRAMUSCULAR | Status: DC | PRN
  Administered 2013-09-27: 4 mg via INTRAVENOUS

## 2013-09-27 MED ORDER — SENNA 8.6 MG PO TABS
1.0000 | ORAL_TABLET | Freq: Two times a day (BID) | ORAL | Status: DC
  Administered 2013-09-27 – 2013-10-03 (×12): 8.6 mg via ORAL
  Filled 2013-09-27 (×14): qty 1

## 2013-09-27 MED ORDER — LIDOCAINE HCL (CARDIAC) 20 MG/ML IV SOLN
INTRAVENOUS | Status: DC | PRN
  Administered 2013-09-27: 80 mg via INTRAVENOUS

## 2013-09-27 MED ORDER — PROMETHAZINE HCL 12.5 MG PO TABS
12.5000 mg | ORAL_TABLET | ORAL | Status: DC | PRN
  Filled 2013-09-27: qty 2

## 2013-09-27 MED ORDER — DEXAMETHASONE SODIUM PHOSPHATE 4 MG/ML IJ SOLN
4.0000 mg | Freq: Four times a day (QID) | INTRAMUSCULAR | Status: AC
  Administered 2013-09-28 – 2013-09-29 (×4): 4 mg via INTRAVENOUS
  Filled 2013-09-27 (×6): qty 1

## 2013-09-27 MED ORDER — EPHEDRINE SULFATE 50 MG/ML IJ SOLN
INTRAMUSCULAR | Status: DC | PRN
  Administered 2013-09-27 (×8): 5 mg via INTRAVENOUS

## 2013-09-27 MED ORDER — THROMBIN 5000 UNITS EX SOLR
OROMUCOSAL | Status: DC | PRN
  Administered 2013-09-27 (×2): via TOPICAL

## 2013-09-27 MED ORDER — SODIUM CHLORIDE 0.9 % IV SOLN
500.0000 mg | Freq: Two times a day (BID) | INTRAVENOUS | Status: DC
  Administered 2013-09-27 – 2013-10-03 (×13): 500 mg via INTRAVENOUS
  Filled 2013-09-27 (×15): qty 5

## 2013-09-27 MED ORDER — LEVETIRACETAM 500 MG/5ML IV SOLN
1000.0000 mg | INTRAVENOUS | Status: AC
  Administered 2013-09-27: 1000 mg via INTRAVENOUS
  Filled 2013-09-27: qty 10

## 2013-09-27 MED ORDER — THROMBIN 20000 UNITS EX SOLR
CUTANEOUS | Status: DC | PRN
  Administered 2013-09-27 (×2): via TOPICAL

## 2013-09-27 MED ORDER — FENTANYL CITRATE 0.05 MG/ML IJ SOLN
25.0000 ug | INTRAMUSCULAR | Status: DC | PRN
  Administered 2013-09-27 (×2): 25 ug via INTRAVENOUS
  Administered 2013-09-27: 50 ug via INTRAVENOUS

## 2013-09-27 MED ORDER — ALBUMIN HUMAN 5 % IV SOLN
INTRAVENOUS | Status: DC | PRN
  Administered 2013-09-27 (×2): via INTRAVENOUS

## 2013-09-27 MED ORDER — BACITRACIN ZINC 500 UNIT/GM EX OINT
TOPICAL_OINTMENT | CUTANEOUS | Status: DC | PRN
  Administered 2013-09-27: 1 via TOPICAL

## 2013-09-27 MED ORDER — BUPIVACAINE HCL (PF) 0.5 % IJ SOLN
INTRAMUSCULAR | Status: DC | PRN
  Administered 2013-09-27: 20 mL

## 2013-09-27 MED ORDER — LIDOCAINE-EPINEPHRINE 1 %-1:100000 IJ SOLN
INTRAMUSCULAR | Status: DC | PRN
  Administered 2013-09-27: 20 mL

## 2013-09-27 MED ORDER — FENTANYL CITRATE 0.05 MG/ML IJ SOLN
INTRAMUSCULAR | Status: AC
  Filled 2013-09-27: qty 2

## 2013-09-27 MED ORDER — CEFAZOLIN SODIUM-DEXTROSE 2-3 GM-% IV SOLR
2.0000 g | Freq: Three times a day (TID) | INTRAVENOUS | Status: AC
  Administered 2013-09-27 (×2): 2 g via INTRAVENOUS
  Filled 2013-09-27 (×2): qty 50

## 2013-09-27 MED ORDER — SODIUM CHLORIDE 0.9 % IR SOLN
Status: DC | PRN
  Administered 2013-09-27: 11:00:00

## 2013-09-27 MED ORDER — ONDANSETRON HCL 4 MG/2ML IJ SOLN: 4.0000 mg | Freq: Once | INTRAMUSCULAR | Status: DC | PRN

## 2013-09-27 SURGICAL SUPPLY — 107 items
APL SKNCLS STERI-STRIP NONHPOA (GAUZE/BANDAGES/DRESSINGS)
BANDAGE GAUZE 4  KLING STR (GAUZE/BANDAGES/DRESSINGS) ×2 IMPLANT
BANDAGE GAUZE ELAST BULKY 4 IN (GAUZE/BANDAGES/DRESSINGS) IMPLANT
BENZOIN TINCTURE PRP APPL 2/3 (GAUZE/BANDAGES/DRESSINGS) IMPLANT
BLADE SAW GIGLI 16 STRL (MISCELLANEOUS) IMPLANT
BLADE SURG 15 STRL LF DISP TIS (BLADE) IMPLANT
BLADE SURG 15 STRL SS (BLADE)
BLADE SURG ROTATE 9660 (MISCELLANEOUS) ×3 IMPLANT
BLADE ULTRA TIP 2M (BLADE) ×3 IMPLANT
BNDG GAUZE ELAST 4 BULKY (GAUZE/BANDAGES/DRESSINGS) ×2 IMPLANT
BRUSH SCRUB EZ 1% IODOPHOR (MISCELLANEOUS) ×3 IMPLANT
BUR ACORN 6.0 PRECISION (BURR) ×2 IMPLANT
BUR ACORN 6.0MM PRECISION (BURR) ×1
BUR ADDG 1.1 (BURR) IMPLANT
BUR ADDG 1.1MM (BURR)
BUR MATCHSTICK NEURO 3.0 LAGG (BURR) ×2 IMPLANT
BUR ROUTER D-58 CRANI (BURR) ×2 IMPLANT
CANISTER SUCT 3000ML (MISCELLANEOUS) ×3 IMPLANT
CATH VENTRIC 35X38 W/TROCAR LG (CATHETERS) IMPLANT
CLIP TI MEDIUM 6 (CLIP) ×2 IMPLANT
CONT SPEC 4OZ CLIKSEAL STRL BL (MISCELLANEOUS) ×6 IMPLANT
CORDS BIPOLAR (ELECTRODE) ×3 IMPLANT
COVER MAYO STAND STRL (DRAPES) IMPLANT
DECANTER SPIKE VIAL GLASS SM (MISCELLANEOUS) ×3 IMPLANT
DRAIN SNY WOU 7FLT (WOUND CARE) IMPLANT
DRAIN SUBARACHNOID (WOUND CARE) IMPLANT
DRAPE MICROSCOPE LEICA (MISCELLANEOUS) ×4 IMPLANT
DRAPE NEUROLOGICAL W/INCISE (DRAPES) ×3 IMPLANT
DRAPE ORTHO SPLIT 77X108 STRL (DRAPES)
DRAPE STERI IOBAN 125X83 (DRAPES) IMPLANT
DRAPE SURG 17X23 STRL (DRAPES) IMPLANT
DRAPE SURG IRRIG POUCH 19X23 (DRAPES) ×2 IMPLANT
DRAPE SURG ORHT 6 SPLT 77X108 (DRAPES) IMPLANT
DRAPE WARM FLUID 44X44 (DRAPE) ×3 IMPLANT
DRESSING TELFA 8X3 (GAUZE/BANDAGES/DRESSINGS) ×1 IMPLANT
DURAMATRIX ONLAY 3X3 (Plate) ×4 IMPLANT
DURAPREP 6ML APPLICATOR 50/CS (WOUND CARE) ×3 IMPLANT
ELECT CAUTERY BLADE 6.4 (BLADE) ×3 IMPLANT
ELECT REM PT RETURN 9FT ADLT (ELECTROSURGICAL) ×3
ELECTRODE REM PT RTRN 9FT ADLT (ELECTROSURGICAL) ×1 IMPLANT
EVACUATOR 1/8 PVC DRAIN (DRAIN) IMPLANT
EVACUATOR SILICONE 100CC (DRAIN) IMPLANT
GAUZE SPONGE 4X4 16PLY XRAY LF (GAUZE/BANDAGES/DRESSINGS) IMPLANT
GLOVE BIOGEL PI IND STRL 7.5 (GLOVE) IMPLANT
GLOVE BIOGEL PI INDICATOR 7.5 (GLOVE) ×2
GLOVE ECLIPSE 6.5 STRL STRAW (GLOVE) ×3 IMPLANT
GLOVE ECLIPSE 7.5 STRL STRAW (GLOVE) ×16 IMPLANT
GLOVE EXAM NITRILE LRG STRL (GLOVE) IMPLANT
GLOVE EXAM NITRILE MD LF STRL (GLOVE) IMPLANT
GLOVE EXAM NITRILE XL STR (GLOVE) IMPLANT
GLOVE EXAM NITRILE XS STR PU (GLOVE) IMPLANT
GLOVE INDICATOR 8.0 STRL GRN (GLOVE) ×4 IMPLANT
GOWN BRE IMP SLV AUR LG STRL (GOWN DISPOSABLE) ×6 IMPLANT
GOWN BRE IMP SLV AUR XL STRL (GOWN DISPOSABLE) ×2 IMPLANT
GOWN SPEC L3 XXLG W/TWL (GOWN DISPOSABLE) ×4 IMPLANT
GOWN STRL REIN 2XL LVL4 (GOWN DISPOSABLE) IMPLANT
GOWN STRL REUS W/ TWL LRG LVL3 (GOWN DISPOSABLE) IMPLANT
GOWN STRL REUS W/TWL LRG LVL3 (GOWN DISPOSABLE) ×6
HEMOSTAT SURGICEL 2X14 (HEMOSTASIS) IMPLANT
HOOK DURA (MISCELLANEOUS) ×2 IMPLANT
KIT BASIN OR (CUSTOM PROCEDURE TRAY) ×3 IMPLANT
KIT DRAIN CSF ACCUDRAIN (MISCELLANEOUS) IMPLANT
KIT ROOM TURNOVER OR (KITS) ×3 IMPLANT
MARKER SPHERE PSV REFLC NDI (MISCELLANEOUS) ×4 IMPLANT
NDL HYPO 25X1 1.5 SAFETY (NEEDLE) ×1 IMPLANT
NDL SPNL 18GX3.5 QUINCKE PK (NEEDLE) IMPLANT
NEEDLE HYPO 25X1 1.5 SAFETY (NEEDLE) ×3 IMPLANT
NEEDLE SPNL 18GX3.5 QUINCKE PK (NEEDLE) IMPLANT
NS IRRIG 1000ML POUR BTL (IV SOLUTION) ×7 IMPLANT
PACK CRANIOTOMY (CUSTOM PROCEDURE TRAY) ×3 IMPLANT
PAD EYE OVAL STERILE LF (GAUZE/BANDAGES/DRESSINGS) IMPLANT
PATTIES SURGICAL .25X.25 (GAUZE/BANDAGES/DRESSINGS) IMPLANT
PATTIES SURGICAL .5 X.5 (GAUZE/BANDAGES/DRESSINGS) ×4 IMPLANT
PATTIES SURGICAL .5 X3 (DISPOSABLE) ×6 IMPLANT
PATTIES SURGICAL 1/4 X 3 (GAUZE/BANDAGES/DRESSINGS) IMPLANT
PATTIES SURGICAL 1X1 (DISPOSABLE) ×4 IMPLANT
PERFORATOR LRG  14-11MM (BIT) ×2
PERFORATOR LRG 14-11MM (BIT) IMPLANT
PLATE 1.5/0.5 18.5MM BURR HOLE (Plate) ×10 IMPLANT
RUBBERBAND STERILE (MISCELLANEOUS) ×8 IMPLANT
SCREW SELF DRILL HT 1.5/4MM (Screw) ×32 IMPLANT
SET TUBING W/EXT DISP (INSTRUMENTS) ×2 IMPLANT
SPECIMEN JAR SMALL (MISCELLANEOUS) IMPLANT
SPONGE GAUZE 4X4 12PLY (GAUZE/BANDAGES/DRESSINGS) ×3 IMPLANT
SPONGE NEURO XRAY DETECT 1X3 (DISPOSABLE) ×2 IMPLANT
SPONGE SURGIFOAM ABS GEL 100 (HEMOSTASIS) ×3 IMPLANT
STAPLER VISISTAT 35W (STAPLE) ×5 IMPLANT
STOCKINETTE ×2 IMPLANT
SUT ETHILON 3 0 FSL (SUTURE) IMPLANT
SUT ETHILON 3 0 PS 1 (SUTURE) IMPLANT
SUT NURALON 4 0 TR CR/8 (SUTURE) ×7 IMPLANT
SUT SILK 0 TIES 10X30 (SUTURE) IMPLANT
SUT VIC AB 0 CT1 18XCR BRD8 (SUTURE) IMPLANT
SUT VIC AB 0 CT1 8-18 (SUTURE) ×3
SUT VIC AB 2-0 CT2 18 VCP726D (SUTURE) ×6 IMPLANT
SUT VIC AB 3-0 SH 8-18 (SUTURE) ×7 IMPLANT
SYR 20ML ECCENTRIC (SYRINGE) ×3 IMPLANT
SYR CONTROL 10ML LL (SYRINGE) ×3 IMPLANT
TIP SONASTAR STD MISONIX 1.9 (TRAY / TRAY PROCEDURE) IMPLANT
TIP STRAIGHT 25KHZ (INSTRUMENTS) ×2 IMPLANT
TOWEL OR 17X24 6PK STRL BLUE (TOWEL DISPOSABLE) ×3 IMPLANT
TOWEL OR 17X26 10 PK STRL BLUE (TOWEL DISPOSABLE) ×3 IMPLANT
TRAY FOLEY CATH 14FRSI W/METER (CATHETERS) ×1 IMPLANT
TUBE CONNECTING 12'X1/4 (SUCTIONS) ×1
TUBE CONNECTING 12X1/4 (SUCTIONS) ×2 IMPLANT
UNDERPAD 30X30 INCONTINENT (UNDERPADS AND DIAPERS) ×1 IMPLANT
WATER STERILE IRR 1000ML POUR (IV SOLUTION) ×3 IMPLANT

## 2013-09-27 NOTE — Transfer of Care (Signed)
Immediate Anesthesia Transfer of Care Note  Patient: [name].  Procedure(s) Performed: Procedure(s): RIGHT FRONTAL CRANIOTOMY FOR TUMOR RESECTION  (Right)  Patient Location: PACU  Anesthesia Type:General  Level of Consciousness: awake and alert   Airway & Oxygen Therapy: Patient Spontanous Breathing and Patient connected to nasal cannula oxygen  Post-op Assessment: Report given to PACU RN and Post -op Vital signs reviewed and stable  Post vital signs: Reviewed and stable  Complications: No apparent anesthesia complications

## 2013-09-27 NOTE — Interval H&P Note (Signed)
History and Physical Interval Note:  09/27/2013 7:30 AM [name] was seen in the outpatient clinic and electively scheduled for craniotomy for resection of meningioma. He was subsequently admitted after a fall and worsening left hemiparesis.    IMPRESSION 80 year old man with left hemiparesis and sensory changes secondary to a large right parasagittal extra-axial tumor, likely meningioma  PLAN - Right frontoparietal craniotomy for resection of meningioma  The imaging findings were reviewed in detail with the patient and his wife in the office.  I explained to them the likely diagnosis of meningioma which is the cause of his left-sided weakness.  I also reviewed the general treatment options for intracranial tumors including continued observation, surgical resection, and radiosurgery.  I told him that given the size, symptomatic nature, that surgical resection was a reasonable option.  I explained to them the risks of surgery which include worsening left-sided weakness, stroke, bleeding, infection, and inability to resect the entire tumor.  I also reviewed the general risks of anesthesia including heart attack, stroke, and DVT/PE.  The patient and his wife understood our discussion and are eager to proceed with resection.  All their questions were answered.   [name], C

## 2013-09-27 NOTE — Anesthesia Procedure Notes (Signed)
Procedure Name: Intubation Date/Time: 09/27/2013 8:01 AM Performed by: Octavio Graves Pre-anesthesia Checklist: Patient identified, Timeout performed, Emergency Drugs available, Suction available and Patient being monitored Patient Re-evaluated:Patient Re-evaluated prior to inductionOxygen Delivery Method: Circle system utilized Preoxygenation: Pre-oxygenation with 100% oxygen Intubation Type: IV induction Ventilation: Mask ventilation without difficulty Laryngoscope Size: Miller and 2 Grade View: Grade I Tube type: Subglottic suction tube Tube size: 7.5 mm Number of attempts: 1 Airway Equipment and Method: Stylet Placement Confirmation: ETT inserted through vocal cords under direct vision,  breath sounds checked- equal and bilateral and positive ETCO2 Secured at: 23 cm Tube secured with: Tape Dental Injury: Teeth and Oropharynx as per pre-operative assessment  Comments: IV induction crews- intubation am crna- teeth and mouth as preop atraumatic

## 2013-09-27 NOTE — OR Nursing (Signed)
Prior to case patient has silver ring on left ring finger that was unable to be removed after several attemts anesthesia aware clear tape placed around ring

## 2013-09-27 NOTE — Anesthesia Postprocedure Evaluation (Signed)
  Anesthesia Post-op Note  Patient: [name].  Procedure(s) Performed: Procedure(s): RIGHT FRONTAL CRANIOTOMY FOR TUMOR RESECTION  (Right)  Patient Location: PACU  Anesthesia Type:General  Level of Consciousness: awake, alert  and oriented  Airway and Oxygen Therapy: Patient Spontanous Breathing and Patient connected to nasal cannula oxygen  Post-op Pain: mild  Post-op Assessment: Post-op Vital signs reviewed  Post-op Vital Signs: Reviewed  Complications: No apparent anesthesia complications

## 2013-09-27 NOTE — Anesthesia Preprocedure Evaluation (Signed)
Anesthesia Evaluation  Patient identified by MRN, date of birth, ID band Patient awake    Reviewed: Allergy & Precautions, H&P , NPO status , Patient's Chart, lab work & pertinent test results  Airway Mallampati: I TM Distance: >3 FB Neck ROM: Full    Dental  (+) Teeth Intact and Dental Advisory Given   Pulmonary  breath sounds clear to auscultation        Cardiovascular hypertension, Pt. on medications Rhythm:Regular Rate:Normal     Neuro/Psych    GI/Hepatic   Endo/Other    Renal/GU Renal disease     Musculoskeletal   Abdominal   Peds  Hematology   Anesthesia Other Findings   Reproductive/Obstetrics                           Anesthesia Physical Anesthesia Plan  ASA: III  Anesthesia Plan: General   Post-op Pain Management:    Induction: Intravenous  Airway Management Planned: Oral ETT  Additional Equipment: Arterial line and CVP  Intra-op Plan:   Post-operative Plan: Possible Post-op intubation/ventilation  Informed Consent: I have reviewed the patients History and Physical, chart, labs and discussed the procedure including the risks, benefits and alternatives for the proposed anesthesia with the patient or authorized representative who has indicated his/her understanding and acceptance.   Dental advisory given  Plan Discussed with: CRNA, Anesthesiologist and Surgeon  Anesthesia Plan Comments:         Anesthesia Quick Evaluation

## 2013-09-27 NOTE — Op Note (Signed)
PREOP DIAGNOSIS: right frontal tumor   POSTOP DIAGNOSIS: Same  PROCEDURE: 1. Stereotactic right fronto-parietal craniotomy for resection of meningioma 2. Use of intraoperative microscope for microdissection  SURGEON: Dr. Consuella Lose, MD  ASSISTANT: Dr. Hazle Coca, MD  ANESTHESIA: General Endotracheal  EBL: 350cc  SPECIMENS: tumor for frozen/permanent pathology  DRAINS: None  COMPLICATIONS: None immediate  CONDITION: Hemodynamically stable to PACU  HISTORY: [name]. is a 80 y.o. male initially seen in the outpatient clinic with worsening left hemiparesis. He was recently admitted to the hospital after a fall with acute worsening of his weakness, to the point where he was nearly parietal in the left leg. The risks and benefits of surgical resection were explained in detail to the patient and his wife. All their questions were answered, and he provided verbal and written consent for the procedure above.  PROCEDURE IN DETAIL: After informed consent was obtained and witnessed, the patient was brought to the operating room. After induction of general anesthesia, the patient was positioned on the operative table in the supine position. The Mayfield headholder was then applied and the patient, all pressure points were meticulously padded. The Mayfield was affixed to the table. Utilizing the preoperative stereotactic MRI scan, surface markers were correct sterile the skin until satisfactory accuracy was achieved. Utilizing stereotactic system, the surface projection of the  Skin in superior sagittal sinus, as well as the anterior, posterior, and lateral margins of the tumor were marked out. A curvilinear skin incision was then marked to create a craniotomy encompassing the tumor. The region was then prepped and draped in the usual sterile fashion.  After timeout was conducted, skin incision was infiltrated with local anesthetic. Skin incision was then made sharply and Bovie  electrocautery was used to sect through subcutaneous tissue and the galea was incised. A single piece myocutaneous flap was then elevated and reflected anteriorly. Using a high-speed drill multiple bur holes were created just lateral to the midline, and 2 bur holes were created laterally just inferior to the superior temporal line. These are then connected with the craniotome and the craniotomy flap was elevated. There was significant adhesion of the dura to the bone and a dural tear was noted at the anterior aspect of the opening. At this point, adequate hemostasis was achieved on the dural surface using a combination of morcellized Gelfoam and bipolar electrocautery. Extending the previously made durotomy, dural opening was created initially in a horseshoe fashion based medially. The tumor was easily identified with a identifiable arachnoid plane between the tumor and the surrounding brain.  At this point the microscope was draped sterilely and brought into the field, and the remainder of the case was done under the microscope using microdissection. Using a combination of bipolar electrocautery and microdissectors, the arachnoid plane between the tumor and the brain was developed in the anterior, lateral, and posterior margins of the tumor. Periodically, the sonopet and aquaMantys were used to achieve hemostasis on the tumor bed and slowly debulk the tumor to allow further dissection along the periphery. Eventually, the inferior margin of the tumor was identified. A portion of the anterior medial aspect of the tumor was identified, however no identifiable plane between the tumor and the surrounding brain was identified. As tumor was resected, no arachnoid plane was identified and normal white matter was encountered. Once all the lateral tumor was removed, attention was turned to the tumor attached to the medial convexity dura. This was removed slowly using the sonopet, until some  venous bleeding was encountered  likely representing tumor which had infiltrated into the superior sagittal sinus. This was easily controlled with Gelfoam and pressure. The remaining tumor in this plane was then coagulated using the AquaMantys. Having completed resection of the tumor and coagulation of the medial aspect along sinus, attention was turned to hemostasis. This was achieved using a combination of bipolar electrocautery and morcellized Gelfoam. The wound is irrigated with copious amounts of normal saline irrigation and no further bleeding was identified.  DuraMatrix  onlay graft was then used for a dural substitute. The bone flap was then plated and secured using standard titanium plates and screws. Muscle was then closed using interrupted 0 Vicryl stitches, and the galea was closed using interrupted 3-0 Vicryl sutures. The skin was closed using standard surgical skin staples. Sterile dressing was then applied after the Mayfield head holder was removed. The patient was then transferred to the stretcher and taken to the  to PACU  in stable hemodynamic condition.  At the end of the case all sponge, needle, and instrument counts were correct.

## 2013-09-27 NOTE — Progress Notes (Signed)
Physical Therapy Note  Pt off of floor to OR.  Will hold therapy today and need new orders post-op when appropriate.  Thanks.    [place], [place]

## 2013-09-27 NOTE — Preoperative (Signed)
Beta Blockers   Reason not to administer Beta Blockers:Not Applicable 

## 2013-09-28 LAB — CULTURE, BLOOD (ROUTINE X 2): Culture: NO GROWTH

## 2013-09-28 LAB — BASIC METABOLIC PANEL
BUN: 14 mg/dL (ref 6–23)
CHLORIDE: 102 meq/L (ref 96–112)
CO2: 24 mEq/L (ref 19–32)
CREATININE: 0.95 mg/dL (ref 0.50–1.35)
Calcium: 7.8 mg/dL — ABNORMAL LOW (ref 8.4–10.5)
GFR calc non Af Amer: 77 mL/min — ABNORMAL LOW (ref >90)
GFR, EST AFRICAN AMERICAN: 89 mL/min — AB (ref >90)
Glucose, Bld: 126 mg/dL — ABNORMAL HIGH (ref 70–99)
POTASSIUM: 4 meq/L (ref 3.7–5.3)
Sodium: 138 mEq/L (ref 137–147)

## 2013-09-28 LAB — CBC
HEMATOCRIT: 27.1 % — AB (ref 39.0–52.0)
Hemoglobin: 9.4 g/dL — ABNORMAL LOW (ref 13.0–17.0)
MCH: 33.3 pg (ref 26.0–34.0)
MCHC: 34.7 g/dL (ref 30.0–36.0)
MCV: 96.1 fL (ref 78.0–100.0)
PLATELETS: 183 10*3/uL (ref 150–400)
RBC: 2.82 MIL/uL — ABNORMAL LOW (ref 4.22–5.81)
RDW: 13.5 % (ref 11.5–15.5)
WBC: 13.3 10*3/uL — AB (ref 4.0–10.5)

## 2013-09-28 MED ORDER — CALCIUM CARBONATE ANTACID 500 MG PO CHEW
1.0000 | CHEWABLE_TABLET | ORAL | Status: DC | PRN
  Filled 2013-09-28: qty 1

## 2013-09-28 NOTE — Progress Notes (Signed)
Subjective: Patient reports stable left hemiparesis  Objective: Vital signs in last 24 hours: Temp:  [97.6 F (36.4 C)-98.3 F (36.8 C)] 98.1 F (36.7 C) (01/10 0400) Pulse Rate:  [82-102] 82 (01/10 0700) Resp:  [8-21] 20 (01/10 0700) BP: (72-119)/(48-64) 119/53 mmHg (01/10 0700) SpO2:  [91 %-100 %] 91 % (01/10 0700) Arterial Line BP: (77-110)/(42-93) 104/69 mmHg (01/09 2100) Weight:  [93.8 kg (206 lb 12.7 oz)-101 kg (222 lb 10.6 oz)] 101 kg (222 lb 10.6 oz) (01/10 0500)  Intake/Output from previous day: 01/09 0701 - 01/10 0700 In: 4570 [P.O.:680; I.V.:3000; IV Piggyback:810] Out: 3557 [Urine:3825; Blood:350] Intake/Output this shift:   AAOx3 - FC well, left hemiparesis persists Wound:c/d/i  Lab Results:  Recent Labs  09/27/13 0637 09/28/13 0516  WBC 7.7 13.3*  HGB 12.9* 9.4*  HCT 38.0* 27.1*  PLT 186 183   BMET  Recent Labs  09/28/13 0516  NA 138  K 4.0  CL 102  CO2 24  GLUCOSE 126*  BUN 14  CREATININE 0.95  CALCIUM 7.8*    Studies/Results: Dg Hip Complete Left  09/26/2013   CLINICAL DATA:  Recent fall.  EXAM: LEFT HIP - COMPLETE 2+ VIEW  COMPARISON:  None.  FINDINGS: There is no evidence of hip fracture or dislocation. There is no evidence of arthropathy or other focal bone abnormality. Degenerative disc disease noted within the lumbar spine.  IMPRESSION: 1. No acute findings.   Electronically Signed   By: Kerby Moors M.D.   On: 09/26/2013 16:38   Dg Chest Port 1 View  09/27/2013   CLINICAL DATA:  Status post right central line placement  EXAM: PORTABLE CHEST - 1 VIEW  COMPARISON:  09/24/2013  FINDINGS: A new right internal jugular vein central line is noted with the catheter tip just above the cavoatrial junction. No pneumothorax is noted. The lungs are clear. The cardiac shadow is within normal limits. There is suggestion of a hiatal hernia.  IMPRESSION: No evidence of pneumothorax following central line placement.   Electronically Signed   By: Inez Catalina M.D.   On: 09/27/2013 14:51    Assessment/Plan: Pt stable  - start increasing activity   LOS: 9 days     Cheskel Silverio R, MD 09/28/2013, 8:22 AM

## 2013-09-29 ENCOUNTER — Inpatient Hospital Stay (HOSPITAL_COMMUNITY): Payer: Medicare Other

## 2013-09-29 LAB — TYPE AND SCREEN
ABO/RH(D): O POS
Antibody Screen: NEGATIVE
Unit division: 0
Unit division: 0

## 2013-09-29 MED ORDER — GADOBENATE DIMEGLUMINE 529 MG/ML IV SOLN
20.0000 mL | Freq: Once | INTRAVENOUS | Status: AC
  Administered 2013-09-29: 20 mL via INTRAVENOUS

## 2013-09-29 MED ORDER — WHITE PETROLATUM GEL
Status: AC
  Administered 2013-09-29: 0.2
  Filled 2013-09-29: qty 5

## 2013-09-29 NOTE — Progress Notes (Signed)
Subjective: Patient reports stable Left hemiparesis  Objective: Vital signs in last 24 hours: Temp:  [97.7 F (36.5 C)-98.5 F (36.9 C)] 97.7 F (36.5 C) (01/11 0400) Pulse Rate:  [66-94] 83 (01/11 0400) Resp:  [16-34] 24 (01/11 0400) BP: (111-127)/(47-68) 124/68 mmHg (01/11 0400) SpO2:  [91 %-100 %] 95 % (01/11 0400)  Intake/Output from previous day: 01/10 0701 - 01/11 0700 In: 1720 [P.O.:1420] Out: 1150 [Urine:1150] Intake/Output this shift: Total I/O In: 780 [P.O.:480; Other:300] Out: -  PE Stable left hemiparesis Wound:c/d/i  Lab Results:  Recent Labs  09/27/13 0637 09/28/13 0516  WBC 7.7 13.3*  HGB 12.9* 9.4*  HCT 38.0* 27.1*  PLT 186 183   BMET  Recent Labs  09/28/13 0516  NA 138  K 4.0  CL 102  CO2 24  GLUCOSE 126*  BUN 14  CREATININE 0.95  CALCIUM 7.8*    Studies/Results: Dg Chest Port 1 View  09/27/2013   CLINICAL DATA:  Status post right central line placement  EXAM: PORTABLE CHEST - 1 VIEW  COMPARISON:  09/24/2013  FINDINGS: A new right internal jugular vein central line is noted with the catheter tip just above the cavoatrial junction. No pneumothorax is noted. The lungs are clear. The cardiac shadow is within normal limits. There is suggestion of a hiatal hernia.  IMPRESSION: No evidence of pneumothorax following central line placement.   Electronically Signed   By: Inez Catalina M.D.   On: 09/27/2013 14:51    Assessment/Plan: Pt stable  - increase activity - CPM   LOS: 10 days     Finnlee Silvernail R, MD 09/29/2013, 5:00 AM

## 2013-09-29 NOTE — Progress Notes (Signed)
Physical Therapy Treatment Patient Details Name: [name]. MRN: [id number] DOB: [birth year] Today's Date: 09/29/2013 Time: 2409-7353 PT Time Calculation (min): 27 min  PT Assessment / Plan / Recommendation  History of Present Illness Pt is 80 y.o. male with PMH of GERD, HTN, hyperlipidemia, large meningioma with planned craniotomy in the near future who presets to [facility] ED after an episode of fall at home several hours PTA. Pt explains he has been progressively weaker and has had frequent falls, today he fell on his chest. His pain is no in the anterior chest area, throbbing and persistent, 5/10 in severity, non radiating, worse with movement. He denies shortness of breath, no specific abdominal or urinary concerns. No LOC, no headaches, no visual changes.  Pt underwent craniotomy for tumor removal on 09/27/13.   PT Comments   Pt now s/p craniotomy for tumor removal.  Pt re-assessed and goals reset.  Pt continues to have significant lt side weakness with lt leg 1/5. Continue to recommend comprehensive inpatient rehab (CIR) for post-acute therapy needs.   Follow Up Recommendations  CIR     Does the patient have the potential to tolerate intense rehabilitation     Barriers to Discharge        Equipment Recommendations  Wheelchair (measurements PT);Wheelchair cushion (measurements PT);Hospital bed    Recommendations for Other Services    Frequency Min 3X/week   Progress towards PT Goals Progress towards PT goals: Goals downgraded-see care plan  Plan Current plan remains appropriate    Precautions / Restrictions Precautions Precautions: Fall Restrictions Weight Bearing Restrictions: No   Pertinent Vitals/Pain VSS    Mobility  Bed Mobility Overal bed mobility: +2 for physical assistance;Needs Assistance Bed Mobility: Supine to Sit Supine to sit: +2 for physical assistance;Max assist General bed mobility comments: Verbal/tactile cues for technique.  Assist to move LLE off of bed  and to bring trunk up. Transfers Overall transfer level: Needs assistance Equipment used: 2 person hand held assist Transfers: Sit to/from [facility] Sit to Stand: +2 physical assistance;Max assist Stand pivot transfers: +2 physical assistance;Max assist General transfer comment: Heavy assist to bring hips up. Blocking of lt knee in order to maintain extension.  Pt able to scoot/slide rt foot toward chair with blocking of lt knee and heavy facilitation of lt hip extension.  Required assist to move LLE.  Pivoted from bed to chair toward the rt.     Exercises     PT Diagnosis:    PT Problem List:   PT Treatment Interventions:     PT Goals (current goals can now be found in the care plan section)    Visit Information  Last PT Received On: 09/29/13 Assistance Needed: +2 History of Present Illness: Pt is 80 y.o. male with PMH of GERD, HTN, hyperlipidemia, large meningioma with planned craniotomy in the near future who presets to [facility] ED after an episode of fall at home several hours PTA. Pt explains he has been progressively weaker and has had frequent falls, today he fell on his chest. His pain is no in the anterior chest area, throbbing and persistent, 5/10 in severity, non radiating, worse with movement. He denies shortness of breath, no specific abdominal or urinary concerns. No LOC, no headaches, no visual changes.  Pt underwent craniotomy for tumor removal on 09/27/13.    Subjective Data      Cognition  Cognition Arousal/Alertness: Awake/alert Behavior During Therapy: WFL for tasks assessed/performed Overall Cognitive Status: Within Functional Limits for  tasks assessed    Balance  Balance Overall balance assessment: Needs assistance Sitting-balance support: Single extremity supported;Feet supported Sitting balance-Leahy Scale: Fair Standing balance support: Bilateral upper extremity supported Standing balance-Leahy Scale: Poor  End of Session PT - End of  Session Equipment Utilized During Treatment: Gait belt Activity Tolerance: Patient tolerated treatment well Patient left: in chair;with call bell/phone within reach Nurse Communication: Mobility status   GP     [facility] 09/29/2013, 11:30 AM  [facility] PT [phone]

## 2013-09-30 DIAGNOSIS — G8100 Flaccid hemiplegia affecting unspecified side: Secondary | ICD-10-CM

## 2013-09-30 LAB — POCT I-STAT 7, (LYTES, BLD GAS, ICA,H+H)
ACID-BASE EXCESS: 3 mmol/L — AB (ref 0.0–2.0)
Acid-Base Excess: 1 mmol/L (ref 0.0–2.0)
Acid-Base Excess: 4 mmol/L — ABNORMAL HIGH (ref 0.0–2.0)
Acid-base deficit: 2 mmol/L (ref 0.0–2.0)
BICARBONATE: 27 meq/L — AB (ref 20.0–24.0)
BICARBONATE: 26.2 meq/L — AB (ref 20.0–24.0)
BICARBONATE: 22.1 meq/L (ref 20.0–24.0)
Bicarbonate: 28 mEq/L — ABNORMAL HIGH (ref 20.0–24.0)
Calcium, Ion: 1.12 mmol/L — ABNORMAL LOW (ref 1.13–1.30)
Calcium, Ion: 1.12 mmol/L — ABNORMAL LOW (ref 1.13–1.30)
Calcium, Ion: 1.08 mmol/L — ABNORMAL LOW (ref 1.13–1.30)
Calcium, Ion: 1.07 mmol/L — ABNORMAL LOW (ref 1.13–1.30)
HCT: 33 % — ABNORMAL LOW (ref 39.0–52.0)
HCT: 34 % — ABNORMAL LOW (ref 39.0–52.0)
HEMATOCRIT: 31 % — AB (ref 39.0–52.0)
HEMATOCRIT: 28 % — AB (ref 39.0–52.0)
HEMOGLOBIN: 9.5 g/dL — AB (ref 13.0–17.0)
Hemoglobin: 11.2 g/dL — ABNORMAL LOW (ref 13.0–17.0)
Hemoglobin: 11.6 g/dL — ABNORMAL LOW (ref 13.0–17.0)
Hemoglobin: 10.5 g/dL — ABNORMAL LOW (ref 13.0–17.0)
O2 SAT: 100 %
O2 Saturation: 100 %
O2 Saturation: 100 %
O2 Saturation: 99 %
PH ART: 7.418 (ref 7.350–7.450)
PH ART: 7.426 (ref 7.350–7.450)
PO2 ART: 191 mmHg — AB (ref 80.0–100.0)
PO2 ART: 225 mmHg — AB (ref 80.0–100.0)
POTASSIUM: 3.5 meq/L — AB (ref 3.7–5.3)
Patient temperature: 35.8
Patient temperature: 35.9
Patient temperature: 36.1
Potassium: 3.7 mEq/L (ref 3.7–5.3)
Potassium: 3.6 mEq/L — ABNORMAL LOW (ref 3.7–5.3)
Potassium: 3.5 mEq/L — ABNORMAL LOW (ref 3.7–5.3)
SODIUM: 143 meq/L (ref 137–147)
Sodium: 136 mEq/L — ABNORMAL LOW (ref 137–147)
Sodium: 138 mEq/L (ref 137–147)
Sodium: 141 mEq/L (ref 137–147)
TCO2: 28 mmol/L (ref 0–100)
TCO2: 27 mmol/L (ref 0–100)
TCO2: 29 mmol/L (ref 0–100)
TCO2: 23 mmol/L (ref 0–100)
pCO2 arterial: 38.3 mmHg (ref 35.0–45.0)
pCO2 arterial: 40.1 mmHg (ref 35.0–45.0)
pCO2 arterial: 38.7 mmHg (ref 35.0–45.0)
pCO2 arterial: 33.4 mmHg — ABNORMAL LOW (ref 35.0–45.0)
pH, Arterial: 7.451 — ABNORMAL HIGH (ref 7.350–7.450)
pH, Arterial: 7.464 — ABNORMAL HIGH (ref 7.350–7.450)
pO2, Arterial: 173 mmHg — ABNORMAL HIGH (ref 80.0–100.0)
pO2, Arterial: 130 mmHg — ABNORMAL HIGH (ref 80.0–100.0)

## 2013-09-30 MED ORDER — ENSURE COMPLETE PO LIQD
237.0000 mL | Freq: Two times a day (BID) | ORAL | Status: DC
  Administered 2013-10-01 – 2013-10-03 (×6): 237 mL via ORAL

## 2013-09-30 NOTE — Clinical Social Work Note (Signed)
Clinical Social Worker continuing to follow for support and discharge planning needs.  CSW received notification from [facility] that patient has been authorized for SNF placement and provided contact number to call when patient is nearing discharge for authorization number [phone]).  Per chart, patient is being evaluated for inpatient rehab and would prefer SNF as back up option.  Per handoff, patient with preference to [facility] who has extended a bed if bed available at time of discharge.  CSW remains available for support and to facilitate patient discharge needs as needed.  Barbette Or, [place]

## 2013-09-30 NOTE — Progress Notes (Signed)
Patient arrived on unit via wheelchair by NT.  Patient helped to the bed and made comfortable.  Dinner tray provided and patient oriented to [employer]  Will pass on report to oncoming shift.  Kizzie Bane, RN

## 2013-09-30 NOTE — Progress Notes (Signed)
Physical Therapy Treatment Patient Details Name: [name]. MRN: [id number] DOB: [birth year] Today's Date: 09/30/2013 Time: 3419-3790 PT Time Calculation (min): 30 min  PT Assessment / Plan / Recommendation  History of Present Illness Pt s/p R craniotomy 09/26/13. Pt is 80 y.o. male with PMH of GERD, HTN, hyperlipidemia, large meningioma with planned craniotomy in the near future who presets to [facility] ED after an episode of fall at home several hours PTA. Pt explains he has been progressively weaker and has had frequent falls, today he fell on his chest. His pain is no in the anterior chest area, throbbing and persistent, 5/10 in severity, non radiating, worse with movement. He denies shortness of breath, no specific abdominal or urinary concerns. No LOC, no headaches, no visual changes.  Pt underwent craniotomy for tumor removal on 09/27/13.Underwent craniotomy for tumor excision.   PT Comments   Pt with improved ability to move L UE/LE however pt with noted L sided neglect. Pt with improved ability to transfer this date. Pt motivated and an excellent candidate for CIR upon d/c.  Follow Up Recommendations  CIR     Does the patient have the potential to tolerate intense rehabilitation     Barriers to Discharge        Equipment Recommendations       Recommendations for Other Services    Frequency Min 4X/week   Progress towards PT Goals Progress towards PT goals: Progressing toward goals  Plan Frequency needs to be updated    Precautions / Restrictions Precautions Precautions: Fall Precaution Comments: L sided neglect Restrictions Weight Bearing Restrictions: No   Pertinent Vitals/Pain Denies pain    Mobility  Bed Mobility Overal bed mobility: Needs Assistance Bed Mobility: Sit to Supine Supine to sit: Max assist Sit to supine: Max assist;+2 for physical assistance General bed mobility comments: pt initiates mvmt Transfers Overall transfer level: Needs assistance Equipment  used: 2 person hand held assist Transfers: Sit to/from Stand Sit to Stand: Mod assist;+2 physical assistance Stand pivot transfers: Mod assist;+2 physical assistance General transfer comment: worked on sit to stand x 4 trials. Worked on pre-gait activities improving L LE WBing via lifting R LE. L knee requires blocking to prevent buckling when in full [place] position    Exercises Other Exercises Other Exercises: encouraged pt to use visual feedback when moving L side   PT Diagnosis:    PT Problem List:   PT Treatment Interventions:     PT Goals (current goals can now be found in the care plan section) Acute Rehab PT Goals Patient Stated Goal: to get stronger  Visit Information  Last PT Received On: 09/30/13 Assistance Needed: +2 History of Present Illness: Pt s/p R craniotomy 09/26/13. Pt is 80 y.o. male with PMH of GERD, HTN, hyperlipidemia, large meningioma with planned craniotomy in the near future who presets to [facility] ED after an episode of fall at home several hours PTA. Pt explains he has been progressively weaker and has had frequent falls, today he fell on his chest. His pain is no in the anterior chest area, throbbing and persistent, 5/10 in severity, non radiating, worse with movement. He denies shortness of breath, no specific abdominal or urinary concerns. No LOC, no headaches, no visual changes.  Pt underwent craniotomy for tumor removal on 09/27/13.Underwent craniotomy for tumor excision.    Subjective Data  Patient Stated Goal: to get stronger   Cognition  Cognition Arousal/Alertness: Awake/alert Behavior During Therapy: WFL for tasks assessed/performed Overall Cognitive Status:  Impaired/Different from baseline Area of Impairment: Attention;Memory;Following commands;Safety/judgement;Awareness;Problem solving Current Attention Level: Sustained Memory: Decreased short-term memory;Decreased recall of precautions Following Commands: Follows one step commands with increased  time Safety/Judgement: Decreased awareness of safety;Decreased awareness of deficits Awareness: Intellectual Problem Solving: Slow processing;Decreased initiation;Difficulty sequencing;Requires verbal cues;Requires tactile cues General Comments: decreased awareness L side    Balance  Balance Overall balance assessment: Needs assistance Sitting-balance support: Bilateral upper extremity supported;Feet supported Sitting balance-Leahy Scale: Fair Dynamic Sitting - Comments: posterior bias Postural control: Posterior lean;Left lateral lean Standing balance support: Bilateral upper extremity supported;During functional activity Standing balance-Leahy Scale: Poor General Comments General comments (skin integrity, edema, etc.): pt assisted to [facility]. dependent for hygiene.  End of Session PT - End of Session Equipment Utilized During Treatment: Gait belt Activity Tolerance: Patient tolerated treatment well Patient left: in bed;with call bell/phone within reach Nurse Communication: Mobility status   GP     Kingsley Callander 09/30/2013, 5:16 PM   Kittie Plater, PT, DPT Pager #: [phone] Office #: [phone]

## 2013-09-30 NOTE — Progress Notes (Signed)
Occupational Therapy Re-Evaluation Patient Details Name: [name]. MRN: [id number] DOB: 1931-08-03 Today's Date: 09/30/2013 Time: 6010-9323 OT Time Calculation (min): 31 min  OT Assessment / Plan / Recommendation History of present illness Pt is 80 y.o. male with PMH of GERD, HTN, hyperlipidemia, large meningioma with planned craniotomy in the near future who presets to [facility] ED after an episode of fall at home several hours PTA. Pt explains he has been progressively weaker and has had frequent falls, today he fell on his chest. His pain is no in the anterior chest area, throbbing and persistent, 5/10 in severity, non radiating, worse with movement. He denies shortness of breath, no specific abdominal or urinary concerns. No LOC, no headaches, no visual changes.  Pt underwent craniotomy for tumor removal on 09/27/13.   Clinical Impression   PTA, pt lived at home with wife and was independent with ADL and mobility. Pt presents with significant functional decline due to below deficits  and L inattention and L hemiparesis. Pt demonstrating movmement with LU and LE - poorly controlled with abnormal tone.  Feel pt would benefit from CIR to return home with wife @ S level. Pt will benefit from skilled OT services to facilitate D/C to CIR due to below deficits.    OT Assessment  Patient needs continued OT Services    Follow Up Recommendations  CIR    Barriers to Discharge  unsure of caregiver support    Equipment Recommendations  3 in 1 bedside comode;Tub/shower bench    Recommendations for Other Services Rehab consult  Frequency  Min 3X/week    Precautions / Restrictions Precautions Precautions: Fall Precaution Comments: decreased awareness L side Restrictions Weight Bearing Restrictions: No   Pertinent Vitals/Pain no apparent distress     ADL  Grooming: Moderate assistance Where Assessed - Grooming: Supported sitting Upper Body Bathing: Maximal assistance Where Assessed - Upper  Body Bathing: Supported sitting Lower Body Bathing: Maximal assistance Where Assessed - Lower Body Bathing: Supported sit to stand Upper Body Dressing: Maximal assistance Where Assessed - Upper Body Dressing: Supported sitting Lower Body Dressing: Maximal assistance Where Assessed - Lower Body Dressing: Supported sit to Lobbyist: Maximal assistance;Simulated Armed forces technical officer Method: Stand pivot Equipment Used: Gait belt Transfers/Ambulation Related to ADLs: difficulty with upright posture. vc for hand placement and sequence ADL Comments: significant decline in function    OT Diagnosis: Generalized weakness;Cognitive deficits;Disturbance of vision;Acute pain;Hemiplegia non-dominant side  OT Problem List: Decreased strength;Decreased range of motion;Decreased activity tolerance;Impaired balance (sitting and/or standing);Impaired vision/perception;Decreased coordination;Decreased cognition;Decreased safety awareness;Decreased knowledge of use of DME or AE;Decreased knowledge of precautions;Cardiopulmonary status limiting activity;Impaired sensation;Impaired tone;Obesity;Impaired UE functional use;Pain;Increased edema OT Treatment Interventions: Self-care/ADL training;Therapeutic exercise;Energy conservation;DME and/or AE instruction;Therapeutic activities;Cognitive remediation/compensation;Balance training;Patient/family education;Visual/perceptual remediation/compensation   OT Goals(Current goals can be found in the care plan section) Acute Rehab OT Goals Patient Stated Goal: to get stronger OT Goal Formulation: With patient Time For Goal Achievement: 10/14/13 Potential to Achieve Goals: Good  Visit Information  Last OT Received On: 09/30/13 Assistance Needed: +2 (for mobility) History of Present Illness: Pt is 80 y.o. male with PMH of GERD, HTN, hyperlipidemia, large meningioma with planned craniotomy in the near future who presets to [facility] ED after an episode of fall at home  several hours PTA. Pt explains he has been progressively weaker and has had frequent falls, today he fell on his chest. His pain is no in the anterior chest area, throbbing and persistent, 5/10 in severity, non radiating, worse  with movement. He denies shortness of breath, no specific abdominal or urinary concerns. No LOC, no headaches, no visual changes.  Pt underwent craniotomy for tumor removal on 09/27/13.Underwent craniotomy for tumor excision.       Prior [place] expects to be discharged to:: Inpatient rehab Prior Function Level of Independence: Independent Communication Communication: No difficulties Dominant Hand: Right         Vision/Perception Vision - History Baseline Vision: Wears glasses only for reading Patient Visual Report: No change from baseline Vision - Assessment Eye Alignment: Within Functional Limits Vision Assessment: Vision not tested Additional Comments: vision appears intact, however, demonstrates visual inattention Perception Perception: Impaired Inattention/Neglect: Does not attend to right side of body (decreased attention) Praxis Praxis: Impaired Praxis Impairment Details: Initiation   Cognition  Cognition Arousal/Alertness: Awake/alert Behavior During Therapy:  (distracted) Overall Cognitive Status: Impaired/Different from baseline Area of Impairment: Attention;Memory;Following commands;Safety/judgement;Awareness;Problem solving Current Attention Level: Sustained Memory: Decreased short-term memory;Decreased recall of precautions Following Commands: Follows one step commands with increased time Safety/Judgement: Decreased awareness of safety;Decreased awareness of deficits Awareness: Intellectual Problem Solving: Slow processing;Decreased initiation;Difficulty sequencing;Requires verbal cues;Requires tactile cues General Comments: decreased awareness L side    Extremity/Trunk Assessment Upper Extremity  Assessment Upper Extremity Assessment: LUE deficits/detail LUE Deficits / Details: spontaneous movement. Poor control of movement patterns. Poor sensory awreness. poor awareness/monitoring of movement. increased flexor tone - greater proximally. gross grasp/release. unable to use functionally at this time. LUE Sensation: decreased light touch;decreased proprioception LUE Coordination: decreased fine motor;decreased gross motor Lower Extremity Assessment Lower Extremity Assessment: Defer to PT evaluation Cervical / Trunk Assessment Cervical / Trunk Assessment: Other exceptions Cervical / Trunk Exceptions: posterior bias.      Mobility Bed Mobility Overal bed mobility: Needs Assistance Bed Mobility: Supine to Sit Supine to sit: Max assist General bed mobility comments: Pt attenpting to assist with bed mobility Transfers Overall transfer level: Needs assistance Transfers: Sit to/from Stand Sit to Stand: Max assist (for mobility/ambulation) Stand pivot transfers: Max assist;+2 safety/equipment     Exercise Other Exercises Other Exercises: encouraged pt to use visual feedback when moving L side   Balance Balance Overall balance assessment: Needs assistance Sitting-balance support: Bilateral upper extremity supported;Feet supported Sitting balance-Leahy Scale: Fair Dynamic Sitting - Comments: posterior bias Postural control: Posterior lean;Left lateral lean Standing balance support: Bilateral upper extremity supported;During functional activity Standing balance-Leahy Scale: Poor General Comments General comments (skin integrity, edema, etc.): scalp incisions/staples   End of Session OT - End of Session Equipment Utilized During Treatment: Gait belt Activity Tolerance: Patient tolerated treatment well Patient left: in chair;with call bell/phone within reach Nurse Communication: Mobility status  GO     Marleen Moret,HILLARY 09/30/2013, 4:10 PM [facility], OTR/L  [phone] 09/30/2013

## 2013-09-30 NOTE — Progress Notes (Signed)
Rehab admissions - Evaluated for possible admission.  I have spoken with patient's wife.  She would like inpatient rehab and then hopes to take patient home with her.  I have faxed information to [facility] requesting acute inpatient rehab admission.  Call me for questions.  #[phone]

## 2013-09-30 NOTE — Progress Notes (Signed)
UR completed.  Alliyah Roesler, RN BSN M[facility] CCM Trauma/Neuro ICU Case Manager [phone]  

## 2013-09-30 NOTE — Progress Notes (Signed)
NUTRITION FOLLOW UP  Intervention:    1. Increase Ensure Complete to BID.   Nutrition Dx:   Increased nutrient needs related to brain mass and upcoming surgery as evidenced by estimated needs; ongoing.   Goal:  Intake to meet >90% of estimated nutrition needs; not met.   Monitor:  weight trends, lab trends, I/O's, PO intake, supplement tolerance  Assessment:   PMHx significant for GERD, HTN, HLD, large meningioma with planned craniotomy. Admitted s/p fall at home. Work-up reveals gait instability/dizziness 2/2 mass and suspected orthostasis. Pt is s/p craniotomy on 1/9. Per pt he does not like the food here. Wife feeding pt lunch during visit. Pt does like the ensure and is willing to drink more since he is eating inadequately.  Pt hopes to go to inpatient rehab.   Height: Ht Readings from Last 1 Encounters:  09/27/13 _0  (1.727 m)    Weight Status:   Wt Readings from Last 1 Encounters:  09/30/13 211 lb 13.8 oz (96.1 kg)    Re-estimated needs:  Kcal: 1875 - 2050  Protein: at least 100 grams daily  Fluid: 1.8 - 2 liters  Skin: head incision  Diet Order: General Meal Completion: <50%   Intake/Output Summary (Last 24 hours) at 09/30/13 1225 Last data filed at 09/30/13 1000  Gross per 24 hour  Intake   1017 ml  Output   2900 ml  Net  -1883 ml    Last BM: 1/8   Labs:   Recent Labs Lab 09/24/13 0528 09/25/13 0425  09/27/13 1201 09/27/13 1404 09/28/13 0516  NA 136* 137  < > 141 143 138  K 3.9 3.7  < > 3.5* 3.5* 4.0  CL 100 102  --   --   --  102  CO2 25 25  --   --   --  24  BUN 16 14  --   --   --  14  CREATININE 0.93 0.95  --   --   --  0.95  CALCIUM 8.0* 8.2*  --   --   --  7.8*  GLUCOSE 85 105*  --   --   --  126*  < > = values in this interval not displayed.  CBG (last 3)  No results found for this basename: GLUCAP,  in the last 72 hours  Scheduled Meds: . beta carotene w/minerals  1 tablet Oral Daily  . darifenacin  7.5 mg Oral Daily  .  dexamethasone  4 mg Intravenous Q8H  . docusate sodium  100 mg Oral Daily  . feeding supplement (ENSURE COMPLETE)  237 mL Oral Q24H  . ferrous sulfate  325 mg Oral Q breakfast  . levETIRAcetam  500 mg Intravenous Q12H  . lidocaine  1 patch Transdermal Q24H  . pantoprazole  40 mg Oral Daily  . polyethylene glycol  17 g Oral Daily  . rOPINIRole  1 mg Oral Q1500   And  . rOPINIRole  1 mg Oral QHS  . saccharomyces boulardii  250 mg Oral Daily  . senna  1 tablet Oral BID  . simvastatin  40 mg Oral QPM  . tamsulosin  0.4 mg Oral QPC supper    Continuous Infusions:   Maylon Peppers RD, LDN, CNSC [phone] Pager [phone] After Hours Pager

## 2013-09-30 NOTE — Clinical Documentation Improvement (Signed)
THIS DOCUMENT IS NOT A PERMANENT PART OF THE MEDICAL RECORD  Please update your documentation with the medical record to reflect your response to this query. If you need help knowing how to do this please call [phone].                                                                                         09/30/13   Dr. Kathyrn Sheriff,  In a better effort to capture your patient's severity of illness, reflect appropriate length of stay and utilization of resources, a review of the patient medical record has revealed the following indicators:  "Peri-lesional stroke in post-central gyrus" documented in progress note 09/30/13.   Based on your clinical judgment, please document in the progress notes and discharge summary if:  The "Peri-lesional Stroke diagnosis is integral to the procedure  The Per-lesional Stroke diagnosis is a complication of the procedure  The Peri-lesional Stroke diagnosis is unrelated to the procedure  Unable to Clinically Determine   In responding to this query please exercise your independent judgment.   The fact that a query is asked, does not imply that any particular answer is desired or expected.    Reviewed: additional documentation in the medical record  Thank You,  Erling Conte  RN BSN CCDS Certified Clinical Documentation Specialist: [place]

## 2013-09-30 NOTE — Progress Notes (Addendum)
Pt seen and examined. No issues overnight. Pt denies any specific c/o this am. Sitting up in bed eating breakfast. Reports some increased movement in left foot and hand.  EXAM: Temp:  [98.4 F (36.9 C)] 98.4 F (36.9 C) (01/12 0000) Pulse Rate:  [58-94] 67 (01/12 0700) Resp:  [12-31] 13 (01/12 0700) BP: (102-127)/(44-74) 109/51 mmHg (01/12 0700) SpO2:  [92 %-100 %] 97 % (01/12 0700) Weight:  [96.1 kg (211 lb 13.8 oz)] 96.1 kg (211 lb 13.8 oz) (01/12 0402) Intake/Output     01/11 0701 - 01/12 0700 01/12 0701 - 01/13 0700   P.O. 342    Other     IV Piggyback 315    Total Intake(mL/kg) 657 (6.8)    Urine (mL/kg/hr) 2850 (1.2)    Total Output 2850     Net -2193           Awake, alert, oriented CN grossly intact 5/5 RUE/RLE 2/5 distal LLE, Wiggles fingers, moves wrist on L Wound c/d/i  LABS: Lab Results  Component Value Date   CREATININE 0.95 09/28/2013   BUN 14 09/28/2013   NA 138 09/28/2013   K 4.0 09/28/2013   CL 102 09/28/2013   CO2 24 09/28/2013   Lab Results  Component Value Date   WBC 13.3* 09/28/2013   HGB 9.4* 09/28/2013   HCT 27.1* 09/28/2013   MCV 96.1 09/28/2013   PLT 183 09/28/2013    IMAGING: Postop MRI reviewed with near-total resection of convexity meningioma. Residual tumor remains inside SSS. Peri-lesional stroke in post-central gyrus. Relief of local mass effect/MLS  IMPRESSION: - 80 y.o. male POD# 3 s/p resection right fronto-parietal meningioma, improving dense left hemiparesis - Stroke seen on postop MRI may represent an intraprocedural complication, but etiology is unclear as the patient also had significant worsening of his hemiparesis prior to surgery.   PLAN: - Transfer to floor - Cont PT/OT, plan for CIR upon d/c - F/U pathology - Cont Keppra - D/C dex after today

## 2013-09-30 NOTE — Consult Note (Addendum)
Physical Medicine and Rehabilitation Consult Reason for Consult: Right frontoparietal craniotomy for resection of meningioma Referring Physician: Dr.Nundkumar   HPI: Anais Koenen. is a 80 y.o. right-handed male with known history of large meningioma. Admitted 09/20/2013 with increasing left-sided weakness as well as recent fall. Latest MRI scan showed some increased mass effect. Underwent stereotactic right frontoparietal craniotomy for resection of meningioma 09/27/2013 per Dr. Kathyrn Sheriff. Followup MRI of the brain 09/29/2013 showed complete resection of the main tumor mass although there did appear to be some small amount of residual tumor within the superior sagittal sinus. Maintained on Decadron protocol. He was tolerating a regular diet. Keppra added for seizure prophylaxis. Physical therapy evaluation completed with recommendations of physical medicine rehabilitation consult to consider inpatient rehabilitation services.   Review of Systems  Gastrointestinal:       GERD  Musculoskeletal: Positive for falls, joint pain and myalgias.  Neurological: Positive for dizziness and headaches.       Restless leg syndrome  All other systems reviewed and are negative.   Past Medical History  Diagnosis Date  . Anemia   . Prostate cancer   . Basal cell cancer   . Hypertension   . Hyperlipidemia   . Restless leg   . GERD (gastroesophageal reflux disease)   . Brain tumor    Past Surgical History  Procedure Laterality Date  . Joint replacement      right knee replacement  . Bilateral cateract surgery    . Vasectomy    . Tonsillectomy    . Colonoscopy      colon polyp removed   Family History  Problem Relation Age of Onset  . Parkinsonism Mother   . Dementia Father    Social History:  reports that he has never smoked. He quit smokeless tobacco use about 40 years ago. His smokeless tobacco use included Chew. He reports that he does not drink alcohol or use illicit drugs. Allergies:   Allergies  Allergen Reactions  . Asa [Aspirin] Nausea Only  . Vicodin [Hydrocodone-Acetaminophen] Nausea Only   Medications Prior to Admission  Medication Sig Dispense Refill  . beta carotene w/minerals (OCUVITE) tablet Take 1 tablet by mouth daily.      . ferrous sulfate 325 (65 FE) MG EC tablet Take 325 mg by mouth daily with breakfast.      . Omega-3 Fatty Acids (FISH OIL) 1000 MG CAPS Take 1,000 mg by mouth every morning.      Marland Kitchen omeprazole (PRILOSEC) 20 MG capsule Take 20 mg by mouth daily.      Marland Kitchen rOPINIRole (REQUIP) 2 MG tablet Take 2 mg by mouth at bedtime.      . saccharomyces boulardii (FLORASTOR) 250 MG capsule Take 250 mg by mouth daily.      . simvastatin (ZOCOR) 40 MG tablet Take 40 mg by mouth every evening.      . solifenacin (VESICARE) 5 MG tablet Take 5 mg by mouth daily.      . Tamsulosin HCl (FLOMAX) 0.4 MG CAPS Take 0.4 mg by mouth daily.      . valsartan-hydrochlorothiazide (DIOVAN-HCT) 160-12.5 MG per tablet Take 1 tablet by mouth daily.      . vitamin B-12 (CYANOCOBALAMIN) 100 MCG tablet Take 300 mcg by mouth daily.         Home: Home Living Family/patient expects to be discharged to:: Private residence Living Arrangements: Spouse/significant other Available Help at Discharge: Family Type of Home: House Home Access: Stairs to enter [employer]  of Steps: 4 Entrance Stairs-Rails: Left Home Layout: One level Home Equipment: Cane - single point;Walker - 4 wheels;Grab bars - tub/shower Additional Comments: Pt states that his balance has been decreasing over the last 2 weeks  Functional History: Prior Function Comments: for the past week he has been trying to walk with a RW at home.   Functional Status:  Mobility: Bed Mobility Bed Mobility: Supine to Sit;Sitting - Scoot to Edge of Bed Supine to Sit: With rails;HOB elevated;2: Max assist Sitting - Scoot to Edge of Bed: 3: Mod assist;With rail Sit to Supine: 2: Max assist;HOB flat Scooting to HOB:  2: Max assist Transfers Transfers: Sit to Stand;Stand to Sit Sit to Stand: 3: Mod assist;2: Max assist;From bed Stand to Sit: 3: Mod assist;Without upper extremity assist;With armrests;To chair/3-in-1 Transfer via Lift Equipment: Stedy Ambulation/Gait Ambulation/Gait Assistance: Not tested (comment)    ADL: ADL Grooming: Performed;Wash/dry hands;Wash/dry face;Minimal assistance Where Assessed - Grooming: Supported sitting (mod A for balance) Upper Body Bathing: Simulated;Moderate assistance Lower Body Bathing: Simulated;Maximal assistance;+1 Total assistance Upper Body Dressing: Performed;Moderate assistance Lower Body Dressing: +1 Total assistance Toilet Transfer: Performed;+1 Total assistance Toilet Transfer Method: Sit to stand;Stand pivot Toilet Transfer Equipment: Raised toilet seat with arms (or 3-in-1 over toilet) Tub/Shower Transfer Method: Not assessed Equipment Used: Gait belt;Rolling walker Transfers/Ambulation Related to ADLs: cues for hand placement, technique. Pt states that he has diffuculty "feeling where his  L LE is on the floor" ADL Comments: requires extensive assist with ADLs due to decreased strength/balance and L UE ROM/coordination  Cognition: Cognition Overall Cognitive Status: Within Functional Limits for tasks assessed Orientation Level: Oriented X4 Cognition Arousal/Alertness: Awake/alert Behavior During Therapy: WFL for tasks assessed/performed Overall Cognitive Status: Within Functional Limits for tasks assessed  Blood pressure 109/51, pulse 67, temperature 98.4 F (36.9 C), temperature source Oral, resp. rate 13, height 5\' 8"  (1.727 m), weight 96.1 kg (211 lb 13.8 oz), SpO2 97.00%. Physical Exam  Vitals reviewed. Constitutional: He is oriented to person, place, and time. He appears well-developed.  Eyes:  Pupils reactive to light  Neck: Normal range of motion. Neck supple. No thyromegaly present.  Cardiovascular: Normal rate and regular  rhythm.   Respiratory: Effort normal and breath sounds normal. No respiratory distress.  GI: Soft. Bowel sounds are normal. He exhibits no distension.  Neurological: He is alert and oriented to person, place, and time.  Follows commands.Shows some right gaze preferance  Skin:  Cranio site clean and dry   5/5 strength in the right deltoid, bicep, tricep, grip, hip flexor, knee extensors, ankle dorsi flexion plantar flexor Trace left finger flexors 0 left deltoid bicep triceps 2 minus left hip knee extensor synergy, 0/5 left hamstrings, trace toe flexors and extensors Sensation absent to light touch in the left upper and left lower extremity  No results found for this or any previous visit (from the past 24 hour(s)). Mr Laqueta Jean Wo Contrast  09/29/2013   CLINICAL DATA:  Followup tumor resection. Right frontoparietal craniotomy.  EXAM: MRI HEAD WITHOUT AND WITH CONTRAST  TECHNIQUE: Multiplanar, multiecho pulse sequences of the brain and surrounding structures were obtained without and with intravenous contrast.  CONTRAST:  44mL MULTIHANCE GADOBENATE DIMEGLUMINE 529 MG/ML IV SOLN  COMPARISON:  09/13/2013.  08/28/2013.  FINDINGS: There has been interval right fronto parietal craniotomy for resection of the large vertex meningioma. Diffusion imaging shows a 4 cm region of perioperative infarction at the right posterior frontal region at the vertex. No other acute infarction.  There  is complete resection of the main tumor mass of the right vertex meningioma. It would appear that there does remain some tumor within the superior sagittal sinus at the site of previous involvement.  Tumor mass effect upon the right hemisphere is relieved. There is no midline shift. No hydrocephalus. Intracranial air present in the bifrontal regions as expected.  IMPRESSION: Right frontoparietal craniotomy with complete resection of the main tumor mass of the large meningioma previously seen at the right vertex. There appears to be  a small amount of residual tumor within the superior sagittal sinus at the site of previous involvement.  Postoperative/perioperative infarction in the right posterior frontal brain measuring approximately 4 cm.   Electronically Signed   By: Nelson Chimes M.D.   On: 09/29/2013 10:06    Assessment/Plan: Diagnosis: R Fronto parietal meningioma s/p resection with Left HP 1. Does the need for close, 24 hr/day medical supervision in concert with the patient's rehab needs make it unreasonable for this patient to be served in a less intensive setting? Yes 2. Co-Morbidities requiring supervision/potential complications: CKD,HTN, post op wound large R craniotomy, post op urinary retention 3. Due to bladder management, bowel management, safety, skin/wound care, disease management, medication administration, pain management and patient education, does the patient require 24 hr/day rehab nursing? Yes 4. Does the patient require coordinated care of a physician, rehab nurse, PT (1-2 hrs/day, 5 days/week), OT (1-2 hrs/day, 5 days/week) and SLP (.5-1 hrs/day, 5 days/week) to address physical and functional deficits in the context of the above medical diagnosis(es)? Yes Addressing deficits in the following areas: balance, endurance, locomotion, strength, transferring, bowel/bladder control, bathing, dressing, feeding, grooming, toileting, cognition and psychosocial support 5. Can the patient actively participate in an intensive therapy program of at least 3 hrs of therapy per day at least 5 days per week? Yes 6. The potential for patient to make measurable gains while on inpatient rehab is excellent 7. Anticipated functional outcomes upon discharge from inpatient rehab are min/Sup mob with PT, Min/Sup ADL with OT, mod I med management with SLP. 8. Estimated rehab length of stay to reach the above functional goals is: 12-14 days 9. Does the patient have adequate social supports to accommodate these discharge functional  goals? Potentially 10. Anticipated D/C setting: Home 11. Anticipated post D/C treatments: [place] therapy 12. Overall Rehab/Functional Prognosis: excellent  RECOMMENDATIONS: This patient's condition is appropriate for continued rehabilitative care in the following setting: CIR Patient has agreed to participate in recommended program. Yes Note that insurance prior authorization may be required for reimbursement for recommended care.  Comment:     09/30/2013

## 2013-10-01 ENCOUNTER — Ambulatory Visit: Payer: Medicare Other | Admitting: Physical Therapy

## 2013-10-01 ENCOUNTER — Encounter (HOSPITAL_COMMUNITY): Payer: Self-pay | Admitting: Neurosurgery

## 2013-10-01 MED ORDER — BISACODYL 10 MG RE SUPP: 10.0000 mg | Freq: Every day | RECTAL | Status: DC | PRN

## 2013-10-01 MED ORDER — DEXAMETHASONE SODIUM PHOSPHATE 4 MG/ML IJ SOLN
2.0000 mg | Freq: Three times a day (TID) | INTRAMUSCULAR | Status: AC
  Administered 2013-10-01 – 2013-10-02 (×5): 2 mg via INTRAVENOUS
  Filled 2013-10-01 (×3): qty 0.5

## 2013-10-01 NOTE — Progress Notes (Addendum)
No issues overnight. Pt reports increased movement of RLE. No other c/o x constipation  EXAM:  BP 122/52  Pulse 77  Temp(Src) 97.1 F (36.2 C) (Oral)  Resp 18  Ht 5\' 8"  (1.727 m)  Wt 96.1 kg (211 lb 13.8 oz)  BMI 32.22 kg/m2  SpO2 100%  Awake, alert, oriented  Speech fluent, appropriate  CN grossly intact  5/5 RUE/RLE 3/5 distal LLE, wiggles fingers LUE Wound c/d/i  IMPRESSION:  80 y.o. male POD# 4 s/p resection right frontal meningioma, slowly improving left hemiparesis  PLAN: - Cont PT/OT - Dulcolax PR PRN - Decrease dex to 2mg  - Can attempt trial of void again tomorrow

## 2013-10-01 NOTE — Progress Notes (Signed)
Physical Therapy Treatment Patient Details Name: [name]. MRN: [id number] DOB: 26-Dec-1930 Today's Date: 10/01/2013 Time: 9509-3267 PT Time Calculation (min): 19 min  PT Assessment / Plan / Recommendation  History of Present Illness Pt s/p R craniotomy 09/26/13. Pt is 80 y.o. male with PMH of GERD, HTN, hyperlipidemia, large meningioma with planned craniotomy in the near future who presets to [facility] ED after an episode of fall at home several hours PTA. Pt explains he has been progressively weaker and has had frequent falls, today he fell on his chest. His pain is no in the anterior chest area, throbbing and persistent, 5/10 in severity, non radiating, worse with movement. He denies shortness of breath, no specific abdominal or urinary concerns. No LOC, no headaches, no visual changes.  Pt underwent craniotomy for tumor removal on 09/27/13.Underwent craniotomy for tumor excision.   PT Comments   Pt very pleasant and motivated for OOB.  Follows all cues well and attempts to A with mobility.  Will continue to follow.    Follow Up Recommendations  CIR     Does the patient have the potential to tolerate intense rehabilitation     Barriers to Discharge        Equipment Recommendations  Wheelchair (measurements PT);Wheelchair cushion (measurements PT);Hospital bed    Recommendations for Other Services    Frequency Min 4X/week   Progress towards PT Goals Progress towards PT goals: Progressing toward goals  Plan Current plan remains appropriate    Precautions / Restrictions Precautions Precautions: Fall Precaution Comments: L sided neglect Restrictions Weight Bearing Restrictions: No   Pertinent Vitals/Pain Denied pain.      Mobility  Bed Mobility Overal bed mobility: Needs Assistance Bed Mobility: Supine to Sit Supine to sit: Max assist General bed mobility comments: pt A with mobility, though continues to require extensive A.   Transfers Overall transfer level: Needs  assistance Equipment used: 2 person hand held assist Transfers: Sit to/from [facility] Sit to Stand: Mod assist;+2 physical assistance Stand pivot transfers: Max assist;+2 physical assistance General transfer comment: Blocked pt's L LE with coming to stand and during SPT.  pt needs cues and facilitation for movement through SPT.  Repeated sit to stand from chair working on strengthening and technique.  pt needs cues for hip/trunk extension in standing.      Exercises     PT Diagnosis:    PT Problem List:   PT Treatment Interventions:     PT Goals (current goals can now be found in the care plan section) Acute Rehab PT Goals Time For Goal Achievement: 10/04/13 Potential to Achieve Goals: Good  Visit Information  Last PT Received On: 10/01/13 Assistance Needed: +2 History of Present Illness: Pt s/p R craniotomy 09/26/13. Pt is 80 y.o. male with PMH of GERD, HTN, hyperlipidemia, large meningioma with planned craniotomy in the near future who presets to [facility] ED after an episode of fall at home several hours PTA. Pt explains he has been progressively weaker and has had frequent falls, today he fell on his chest. His pain is no in the anterior chest area, throbbing and persistent, 5/10 in severity, non radiating, worse with movement. He denies shortness of breath, no specific abdominal or urinary concerns. No LOC, no headaches, no visual changes.  Pt underwent craniotomy for tumor removal on 09/27/13.Underwent craniotomy for tumor excision.    Subjective Data      Cognition  Cognition Arousal/Alertness: Awake/alert Behavior During Therapy: WFL for tasks assessed/performed Overall Cognitive  Status: Impaired/Different from baseline Area of Impairment: Attention;Memory;Following commands;Safety/judgement;Awareness;Problem solving Current Attention Level: Sustained Memory: Decreased short-term memory;Decreased recall of precautions Following Commands: Follows one step commands with  increased time Safety/Judgement: Decreased awareness of safety;Decreased awareness of deficits Awareness: Intellectual Problem Solving: Slow processing;Decreased initiation;Difficulty sequencing;Requires verbal cues;Requires tactile cues General Comments: decreased awareness L side    Balance  Balance Overall balance assessment: Needs assistance Sitting-balance support: Bilateral upper extremity supported;Feet supported Sitting balance-Leahy Scale: Fair Dynamic Sitting - Comments: posterior lean Standing balance support: Bilateral upper extremity supported Standing balance-Leahy Scale: Zero  End of Session PT - End of Session Equipment Utilized During Treatment: Gait belt Activity Tolerance: Patient tolerated treatment well Patient left: in chair;with call bell/phone within reach;with chair alarm set;with family/visitor present Nurse Communication: Mobility status;Need for lift equipment   GP     Catarina Hartshorn, [place] 10/01/2013, 12:07 PM

## 2013-10-02 MED ORDER — DEXAMETHASONE 0.5 MG PO TABS: 1.0000 mg | ORAL_TABLET | Freq: Two times a day (BID) | ORAL | Status: DC

## 2013-10-02 MED ORDER — DEXAMETHASONE SODIUM PHOSPHATE 4 MG/ML IJ SOLN
2.0000 mg | Freq: Two times a day (BID) | INTRAMUSCULAR | Status: DC
  Administered 2013-10-03: 2 mg via INTRAVENOUS
  Filled 2013-10-02 (×2): qty 0.5

## 2013-10-02 NOTE — Progress Notes (Signed)
   10/02/13 1445  OT Visit Information  Last OT Received On 10/02/13  Assistance Needed +2  History of Present Illness Pt s/p R craniotomy 09/26/13. Pt is 80 y.o. male with PMH of GERD, HTN, hyperlipidemia, large meningioma with planned craniotomy in the near future who presets to [facility] ED after an episode of fall at home several hours PTA. Pt explains he has been progressively weaker and has had frequent falls, today he fell on his chest. His pain is no in the anterior chest area, throbbing and persistent, 5/10 in severity, non radiating, worse with movement. He denies shortness of breath, no specific abdominal or urinary concerns. No LOC, no headaches, no visual changes.  Pt underwent craniotomy for tumor removal on 09/27/13.Underwent craniotomy for tumor excision.  OT Time Calculation  OT Start Time 1340  OT Stop Time 1410  OT Time Calculation (min) 30 min  Precautions  Precautions Fall  Precaution Comments L sided neglect  Restrictions  Weight Bearing Restrictions No  ADL  Grooming Wash/dry face;Teeth care;Minimal assistance;Shaving  Where Assessed - Grooming Supported sitting  Upper Body Dressing Moderate assistance  Where Assessed - Upper Body Dressing Supported sitting  Toilet Transfer +2 Total assistance;Maximal assistance  Toilet Transfer Method Sit to stand;Stand pivot  [place] - Clothing Manipulation and Hygiene +2 Total assistance;Maximal assistance  Where Assessed - Toileting Clothing Manipulation and Hygiene Sit to stand from 3-in-1 or toilet  Equipment Used Gait belt  Transfers/Ambulation Related to ADLs +2 total A  ADL Comments Told pt to be using LUE. Pt used left hand to support basin for teeth care and to hold electric razor while right hand took top off. Practiced UB dressing with gown. Educated on dressing technique.  Educated to position LUE on pillow and also to avoid sharp objects with LUE.  Pt able to stand at [facility] and hold onto  chair with RUE and OT supported LUE (min guard otherwise).  Cognition  Arousal/Alertness Awake/alert  Behavior During Therapy WFL for tasks assessed/performed  Overall Cognitive Status Impaired/Different from baseline  Area of Impairment Attention  Current Attention Level Sustained  Bed Mobility  Bed Mobility Rolling Left;Left Sidelying to Sit  Rolling Left 3: Mod assist  Left Sidelying to Sit 1: +2 Total assist;HOB elevated;2: Max assist  Details for Bed Mobility Assistance Cues for technique. Assist with legs and trunk when going from sidelying to sitting position.  Transfers  Transfers Sit to Stand;Stand to Sit  Sit to Stand 1: +2 Total assist;From bed;From chair/3-in-1;2: Max assist  Stand to Sit 1: +2 Total assist;2: Max assist;To chair/3-in-1  Details for Transfer Assistance cues for hand placement. OT assisted in supporting RUE when standing.  OT - End of Session  Equipment Utilized During Treatment Gait belt  Activity Tolerance Patient tolerated treatment well  Patient left in chair;with call bell/phone within reach  OT Assessment/Plan  OT Plan Discharge plan remains appropriate  OT Frequency Min 3X/week  Recommendations for Other Services Rehab consult  Follow Up Recommendations CIR  OT Equipment 3 in 1 bedside comode;Tub/shower bench  OT Goal Progression  Progress towards OT goals Progressing toward goals  Acute Rehab OT Goals  Patient Stated Goal not stated  OT Goal Formulation With patient  Time For Goal Achievement 10/14/13  Potential to Achieve Goals Good  OT General Charges  $OT Visit 1 Procedure  OT Treatments  $Self Care/Home Management  23-37 mins   Roseanne Reno, OTR/L [phone]

## 2013-10-02 NOTE — Progress Notes (Signed)
Rehab admissions - I continue to await call back from [facility] regarding possible inpatient rehab admission.  Call me for questions.  #[phone]

## 2013-10-02 NOTE — Progress Notes (Signed)
Physical Therapy Treatment Patient Details Name: [name]. MRN: [id number] DOB: August 22, 1931 Today's Date: 10/02/2013 Time: 3244-0102 PT Time Calculation (min): 19 min  PT Assessment / Plan / Recommendation  History of Present Illness Pt s/p R craniotomy 09/26/13. Pt is 80 y.o. male with PMH of GERD, HTN, hyperlipidemia, large meningioma with planned craniotomy in the near future who presets to [facility] ED after an episode of fall at home several hours PTA. Pt explains he has been progressively weaker and has had frequent falls, today he fell on his chest. His pain is no in the anterior chest area, throbbing and persistent, 5/10 in severity, non radiating, worse with movement. He denies shortness of breath, no specific abdominal or urinary concerns. No LOC, no headaches, no visual changes.  Pt underwent craniotomy for tumor removal on 09/27/13.Underwent craniotomy for tumor excision.   PT Comments   Pt continues to be motivated to improve mobility, but is requiring increased A today.  Pt remaining quite flexed during standing and SPT.  Will continue to follow.    Follow Up Recommendations  CIR     Does the patient have the potential to tolerate intense rehabilitation     Barriers to Discharge        Equipment Recommendations  Wheelchair (measurements PT);Wheelchair cushion (measurements PT);Hospital bed    Recommendations for Other Services    Frequency Min 4X/week   Progress towards PT Goals Progress towards PT goals: Progressing toward goals  Plan Current plan remains appropriate    Precautions / Restrictions Precautions Precautions: Fall Precaution Comments: L sided neglect Restrictions Weight Bearing Restrictions: No   Pertinent Vitals/Pain Did not indicate pain.      Mobility  Bed Mobility Overal bed mobility: Needs Assistance Bed Mobility: Supine to Sit Supine to sit: Max assist General bed mobility comments: pt A with mobility, though continues to require extensive A.    Transfers Overall transfer level: Needs assistance Equipment used: 2 person hand held assist Transfers: Sit to/from [facility] Sit to Stand: Max assist;+2 physical assistance Stand pivot transfers: Max assist;+2 physical assistance General transfer comment: pt requiring increased A today to complete transfers and remaining in more of a flexed posture.  pt does show improvement in his ability to WB through L LE and does A with moving L LE through pivot.      Exercises     PT Diagnosis:    PT Problem List:   PT Treatment Interventions:     PT Goals (current goals can now be found in the care plan section) Acute Rehab PT Goals Time For Goal Achievement: 10/04/13 Potential to Achieve Goals: Good  Visit Information  Last PT Received On: 10/02/13 Assistance Needed: +2 History of Present Illness: Pt s/p R craniotomy 09/26/13. Pt is 80 y.o. male with PMH of GERD, HTN, hyperlipidemia, large meningioma with planned craniotomy in the near future who presets to [facility] ED after an episode of fall at home several hours PTA. Pt explains he has been progressively weaker and has had frequent falls, today he fell on his chest. His pain is no in the anterior chest area, throbbing and persistent, 5/10 in severity, non radiating, worse with movement. He denies shortness of breath, no specific abdominal or urinary concerns. No LOC, no headaches, no visual changes.  Pt underwent craniotomy for tumor removal on 09/27/13.Underwent craniotomy for tumor excision.    Subjective Data      Cognition  Cognition Arousal/Alertness: Awake/alert Behavior During Therapy: WFL for tasks  assessed/performed Overall Cognitive Status: Impaired/Different from baseline Area of Impairment: Attention;Memory;Following commands;Safety/judgement;Awareness;Problem solving Current Attention Level: Sustained Memory: Decreased short-term memory;Decreased recall of precautions Following Commands: Follows one step commands  with increased time Safety/Judgement: Decreased awareness of safety;Decreased awareness of deficits Awareness: Intellectual Problem Solving: Slow processing;Decreased initiation;Difficulty sequencing;Requires verbal cues;Requires tactile cues General Comments: decreased awareness L side    Balance     End of Session PT - End of Session Equipment Utilized During Treatment: Gait belt Activity Tolerance: Patient tolerated treatment well Patient left: in chair;with call bell/phone within reach;with chair alarm set Nurse Communication: Mobility status;Need for lift equipment   GP     Catarina Hartshorn, [place] 10/02/2013, 11:59 AM

## 2013-10-03 ENCOUNTER — Inpatient Hospital Stay (HOSPITAL_COMMUNITY)
Admission: RE | Admit: 2013-10-03 | Discharge: 2013-10-19 | DRG: 945 | Disposition: A | Discharge status: Home / Self Care | Payer: Medicare Other | Source: Intra-hospital | Attending: Physical Medicine & Rehabilitation | Admitting: Physical Medicine & Rehabilitation

## 2013-10-03 ENCOUNTER — Ambulatory Visit: Payer: Medicare Other | Admitting: Physical Therapy

## 2013-10-03 DIAGNOSIS — K5900 Constipation, unspecified: Secondary | ICD-10-CM | POA: Diagnosis present

## 2013-10-03 DIAGNOSIS — D320 Benign neoplasm of cerebral meninges: Secondary | ICD-10-CM

## 2013-10-03 DIAGNOSIS — G2581 Restless legs syndrome: Secondary | ICD-10-CM | POA: Diagnosis present

## 2013-10-03 DIAGNOSIS — Z5189 Encounter for other specified aftercare: Secondary | ICD-10-CM | POA: Diagnosis present

## 2013-10-03 DIAGNOSIS — Z96659 Presence of unspecified artificial knee joint: Secondary | ICD-10-CM | POA: Diagnosis not present

## 2013-10-03 DIAGNOSIS — G8190 Hemiplegia, unspecified affecting unspecified side: Secondary | ICD-10-CM | POA: Diagnosis present

## 2013-10-03 DIAGNOSIS — K219 Gastro-esophageal reflux disease without esophagitis: Secondary | ICD-10-CM | POA: Diagnosis present

## 2013-10-03 DIAGNOSIS — E785 Hyperlipidemia, unspecified: Secondary | ICD-10-CM | POA: Diagnosis present

## 2013-10-03 DIAGNOSIS — N400 Enlarged prostate without lower urinary tract symptoms: Secondary | ICD-10-CM | POA: Diagnosis present

## 2013-10-03 DIAGNOSIS — D62 Acute posthemorrhagic anemia: Secondary | ICD-10-CM | POA: Diagnosis present

## 2013-10-03 DIAGNOSIS — G8110 Spastic hemiplegia affecting unspecified side: Secondary | ICD-10-CM

## 2013-10-03 MED ORDER — OXYCODONE HCL 5 MG PO TABS
5.0000 mg | ORAL_TABLET | ORAL | Status: DC | PRN
  Administered 2013-10-03 – 2013-10-18 (×18): 5 mg via ORAL
  Filled 2013-10-03 (×19): qty 1

## 2013-10-03 MED ORDER — ONDANSETRON HCL 4 MG PO TABS: 4.0000 mg | ORAL_TABLET | Freq: Four times a day (QID) | ORAL | Status: DC | PRN

## 2013-10-03 MED ORDER — OXYCODONE HCL 5 MG PO TABS: 5.0000 mg | ORAL_TABLET | ORAL | Status: DC | PRN

## 2013-10-03 MED ORDER — POLYETHYLENE GLYCOL 3350 17 G PO PACK
17.0000 g | PACK | Freq: Every day | ORAL | Status: DC
Start: 2013-10-04 — End: 2013-10-08
  Filled 2013-10-03 (×6): qty 1

## 2013-10-03 MED ORDER — ENSURE COMPLETE PO LIQD
237.0000 mL | Freq: Two times a day (BID) | ORAL | Status: DC
  Administered 2013-10-05 – 2013-10-18 (×26): 237 mL via ORAL

## 2013-10-03 MED ORDER — DEXAMETHASONE SODIUM PHOSPHATE 4 MG/ML IJ SOLN
2.0000 mg | Freq: Two times a day (BID) | INTRAMUSCULAR | Status: DC
  Administered 2013-10-03: 2 mg via INTRAVENOUS
  Filled 2013-10-03 (×4): qty 0.5

## 2013-10-03 MED ORDER — DEXAMETHASONE SODIUM PHOSPHATE 4 MG/ML IJ SOLN: 2.0000 mg | Freq: Two times a day (BID) | INTRAMUSCULAR | Status: DC

## 2013-10-03 MED ORDER — ONDANSETRON HCL 4 MG/2ML IJ SOLN: 4.0000 mg | Freq: Four times a day (QID) | INTRAMUSCULAR | Status: DC | PRN

## 2013-10-03 MED ORDER — ACETAMINOPHEN 325 MG PO TABS
650.0000 mg | ORAL_TABLET | ORAL | Status: DC | PRN
  Administered 2013-10-09 (×2): 650 mg via ORAL
  Filled 2013-10-03 (×2): qty 2

## 2013-10-03 MED ORDER — CALCIUM CARBONATE ANTACID 500 MG PO CHEW
1.0000 | CHEWABLE_TABLET | ORAL | Status: DC | PRN
  Filled 2013-10-03: qty 1

## 2013-10-03 MED ORDER — PANTOPRAZOLE SODIUM 40 MG PO TBEC
40.0000 mg | DELAYED_RELEASE_TABLET | Freq: Every day | ORAL | Status: DC
  Administered 2013-10-04 – 2013-10-19 (×16): 40 mg via ORAL
  Filled 2013-10-03 (×15): qty 1

## 2013-10-03 MED ORDER — DARIFENACIN HYDROBROMIDE ER 7.5 MG PO TB24
7.5000 mg | ORAL_TABLET | Freq: Every day | ORAL | Status: DC
  Administered 2013-10-04 – 2013-10-19 (×16): 7.5 mg via ORAL
  Filled 2013-10-03 (×17): qty 1

## 2013-10-03 MED ORDER — BISACODYL 10 MG RE SUPP: 10.0000 mg | Freq: Every day | RECTAL | Status: DC | PRN

## 2013-10-03 MED ORDER — ROPINIROLE HCL 1 MG PO TABS
1.0000 mg | ORAL_TABLET | Freq: Every day | ORAL | Status: DC
  Administered 2013-10-04: 1 mg via ORAL
  Filled 2013-10-03 (×2): qty 1

## 2013-10-03 MED ORDER — DEXAMETHASONE 1 MG PO TABS: 1.0000 mg | ORAL_TABLET | Freq: Two times a day (BID) | ORAL | Status: DC

## 2013-10-03 MED ORDER — FERROUS SULFATE 325 (65 FE) MG PO TABS
325.0000 mg | ORAL_TABLET | Freq: Every day | ORAL | Status: DC
  Administered 2013-10-04 – 2013-10-19 (×16): 325 mg via ORAL
  Filled 2013-10-03 (×18): qty 1

## 2013-10-03 MED ORDER — SACCHAROMYCES BOULARDII 250 MG PO CAPS
250.0000 mg | ORAL_CAPSULE | Freq: Every day | ORAL | Status: DC
  Administered 2013-10-04 – 2013-10-19 (×16): 250 mg via ORAL
  Filled 2013-10-03 (×17): qty 1

## 2013-10-03 MED ORDER — DEXAMETHASONE 0.5 MG PO TABS
1.0000 mg | ORAL_TABLET | Freq: Two times a day (BID) | ORAL | Status: DC
  Filled 2013-10-03: qty 2

## 2013-10-03 MED ORDER — DSS 100 MG PO CAPS: 100.0000 mg | ORAL_CAPSULE | Freq: Every day | ORAL | Status: DC

## 2013-10-03 MED ORDER — OCUVITE PO TABS
1.0000 | ORAL_TABLET | Freq: Every day | ORAL | Status: DC
  Administered 2013-10-04 – 2013-10-19 (×16): 1 via ORAL
  Filled 2013-10-03 (×18): qty 1

## 2013-10-03 MED ORDER — SENNA 8.6 MG PO TABS
1.0000 | ORAL_TABLET | Freq: Two times a day (BID) | ORAL | Status: DC
  Administered 2013-10-04 – 2013-10-10 (×14): 8.6 mg via ORAL
  Filled 2013-10-03 (×17): qty 1

## 2013-10-03 MED ORDER — TAMSULOSIN HCL 0.4 MG PO CAPS
0.4000 mg | ORAL_CAPSULE | Freq: Every day | ORAL | Status: DC
  Administered 2013-10-03 – 2013-10-18 (×16): 0.4 mg via ORAL
  Filled 2013-10-03 (×17): qty 1

## 2013-10-03 MED ORDER — SODIUM CHLORIDE 0.9 % IV SOLN: 500.0000 mg | Freq: Two times a day (BID) | INTRAVENOUS | Status: DC

## 2013-10-03 MED ORDER — ROPINIROLE HCL 1 MG PO TABS
1.0000 mg | ORAL_TABLET | Freq: Every day | ORAL | Status: DC
  Administered 2013-10-03 – 2013-10-04 (×2): 1 mg via ORAL
  Filled 2013-10-03 (×4): qty 1

## 2013-10-03 MED ORDER — LIDOCAINE 5 % EX PTCH
1.0000 | MEDICATED_PATCH | CUTANEOUS | Status: DC
  Administered 2013-10-04 – 2013-10-19 (×16): 1 via TRANSDERMAL
  Filled 2013-10-03 (×19): qty 1

## 2013-10-03 MED ORDER — LEVETIRACETAM 500 MG PO TABS
500.0000 mg | ORAL_TABLET | Freq: Two times a day (BID) | ORAL | Status: DC
  Administered 2013-10-03 – 2013-10-19 (×32): 500 mg via ORAL
  Filled 2013-10-03 (×34): qty 1

## 2013-10-03 MED ORDER — SIMVASTATIN 40 MG PO TABS
40.0000 mg | ORAL_TABLET | Freq: Every evening | ORAL | Status: DC
  Administered 2013-10-04 – 2013-10-18 (×15): 40 mg via ORAL
  Filled 2013-10-03 (×16): qty 1

## 2013-10-03 MED ORDER — SORBITOL 70 % SOLN: 30.0000 mL | Freq: Every day | Status: DC | PRN

## 2013-10-03 NOTE — Progress Notes (Signed)
Pt. DC'd to rehab.  Report was called and given to King.  Assessments and vital signs were stable.

## 2013-10-03 NOTE — Progress Notes (Signed)
Physical Therapy Treatment Patient Details Name: [name]. MRN: [id number] DOB: [birth year] Today's Date: 10/03/2013 Time: 1517-6160 PT Time Calculation (min): 18 min  PT Assessment / Plan / Recommendation  History of Present Illness Pt s/p R craniotomy 09/26/13. Pt is 80 y.o. male with PMH of GERD, HTN, hyperlipidemia, large meningioma with planned craniotomy in the near future who presets to [facility] ED after an episode of fall at home several hours PTA. Pt explains he has been progressively weaker and has had frequent falls, today he fell on his chest. His pain is no in the anterior chest area, throbbing and persistent, 5/10 in severity, non radiating, worse with movement. He denies shortness of breath, no specific abdominal or urinary concerns. No LOC, no headaches, no visual changes.  Pt underwent craniotomy for tumor removal on 09/27/13.Underwent craniotomy for tumor excision.   PT Comments   Pt demos increased ability to move L LE and UE.  Pt continue to require extensive A for mobility though.    Follow Up Recommendations  CIR     Does the patient have the potential to tolerate intense rehabilitation     Barriers to Discharge        Equipment Recommendations  Wheelchair (measurements PT);Wheelchair cushion (measurements PT);Hospital bed    Recommendations for Other Services    Frequency Min 4X/week   Progress towards PT Goals Progress towards PT goals: Progressing toward goals  Plan Current plan remains appropriate    Precautions / Restrictions Precautions Precautions: Fall Precaution Comments: L sided neglect Restrictions Weight Bearing Restrictions: No   Pertinent Vitals/Pain Indicates L side "sore".      Mobility  Bed Mobility Overal bed mobility: Needs Assistance Bed Mobility: Supine to Sit Supine to sit: Max assist General bed mobility comments: pt A with mobility, though continues to require extensive A.   Transfers Overall transfer level: Needs  assistance Equipment used: 2 person hand held assist Transfers: Sit to/from [facility] Sit to Stand: Max assist;+2 physical assistance Stand pivot transfers: Max assist;+2 physical assistance General transfer comment: pt demos increased ability to WB through L LE and able to A with moving it through SPT.      Exercises     PT Diagnosis:    PT Problem List:   PT Treatment Interventions:     PT Goals (current goals can now be found in the care plan section) Acute Rehab PT Goals PT Goal Formulation: With patient/family Time For Goal Achievement: 10/04/13 Potential to Achieve Goals: Good  Visit Information  Last PT Received On: 10/03/13 Assistance Needed: +2 History of Present Illness: Pt s/p R craniotomy 09/26/13. Pt is 80 y.o. male with PMH of GERD, HTN, hyperlipidemia, large meningioma with planned craniotomy in the near future who presets to [facility] ED after an episode of fall at home several hours PTA. Pt explains he has been progressively weaker and has had frequent falls, today he fell on his chest. His pain is no in the anterior chest area, throbbing and persistent, 5/10 in severity, non radiating, worse with movement. He denies shortness of breath, no specific abdominal or urinary concerns. No LOC, no headaches, no visual changes.  Pt underwent craniotomy for tumor removal on 09/27/13.Underwent craniotomy for tumor excision.    Subjective Data      Cognition  Cognition Arousal/Alertness: Awake/alert Behavior During Therapy: WFL for tasks assessed/performed Overall Cognitive Status: Impaired/Different from baseline Area of Impairment: Attention;Memory;Following commands;Safety/judgement;Awareness;Problem solving Current Attention Level: Sustained Memory: Decreased short-term memory;Decreased recall of  precautions Following Commands: Follows one step commands with increased time Safety/Judgement: Decreased awareness of safety;Decreased awareness of deficits Awareness:  Intellectual Problem Solving: Slow processing;Decreased initiation;Difficulty sequencing;Requires verbal cues;Requires tactile cues General Comments: decreased awareness L side    Balance  Balance Standing balance support: Bilateral upper extremity supported Standing balance-Leahy Scale: Zero Standing balance comment: pt with increased standing tolerance today and better able to maintain upright posture.    End of Session PT - End of Session Equipment Utilized During Treatment: Gait belt Activity Tolerance: Patient tolerated treatment well Patient left: in chair;with call bell/phone within reach;with chair alarm set Nurse Communication: Mobility status;Need for lift equipment   GP     Catarina Hartshorn, [place] 10/03/2013, 1:29 PM

## 2013-10-03 NOTE — Progress Notes (Signed)
No issues overnight. Pt reports increased movement of RLE and RUE. No other c/o  EXAM:  BP 97/42  Pulse 71  Temp(Src) 97.8 F (36.6 C) (Oral)  Resp 18  Ht 5\' 8"  (1.727 m)  Wt 96.1 kg (211 lb 13.8 oz)  BMI 32.22 kg/m2  SpO2 99%  Awake, alert, oriented  Speech fluent, appropriate  CN grossly intact  5/5 RUE/RLE 4/5 distal LLE, wiggles fingers LUE, 2/5 wrist flex/ext, 2/5 elbow flex/ext Wound c/d/i  IMPRESSION:  80 y.o. male POD# 4 s/p resection right frontal meningioma, slowly improving left hemiparesis  PLAN: - Cont PT/OT, placement in CIR - Cont decadron taper - D/C foley, Trial of void today

## 2013-10-03 NOTE — Progress Notes (Addendum)
Rehab admissions - I called and spoke with customer service at [facility].  I received a call back from insurance case manager.  They are working on request for acute inpatient rehab.  We should know something this afternoon.  I have met with patient and his wife and updated them.  They prefer inpatient rehab here at [facility] but are open to SNF if needed.  Call me for questions.  #[phone]  I now have approval for acute inpatient rehab admission.  Will admit today if okay with MD.  Call me for questions.  #[phone]

## 2013-10-03 NOTE — PMR Pre-admission (Signed)
PMR Admission Coordinator Pre-Admission Assessment  Patient: [name]. is an 80 y.o., male MRN: [id number] DOB: [birth year] Height: 5\' 8"  (172.7 cm) Weight: 96.1 kg (211 lb 13.8 oz)              Insurance Information HMO:  Yes    PPO:       PCP:       IPA:       80/20:       OTHER:   PRIMARY: Blue Medicare      Policy#: [id number]      Subscriber: [place] Name: Any RN      Phone#: [phone]     Fax#: [phone] Pre-Cert#:                            Employer: Retired Benefits:  Phone #: [phone]     Name: Lendon Ka. 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 right-handed male with known history of large meningioma. Admitted 09/20/2013 with increasing left-sided weakness as well as recent fall. Latest MRI scan showed some increased mass effect. Underwent stereotactic right frontoparietal craniotomy for resection of meningioma 09/27/2013 per Dr. Kathyrn Sheriff. Followup MRI of the brain 09/29/2013 showed complete resection of the main tumor mass although there did appear to be some small amount of residual tumor within the superior sagittal sinus. Maintained on Decadron protocol and tapered to 2 mg every 8 hours 10/01/2013. He was tolerating a regular diet. Keppra added for seizure prophylaxis. Acute blood loss anemia 9.4 maintained on iron supplement. Physical and occupational therapy evaluations completed with recommendations of physical medicine  rehabilitation consult to consider inpatient rehabilitation services. Patient was felt to be a good candidate for inpatient rehabilitation services and will be admitted for a comprehensive rehabilitation program.      Past Medical History  Past Medical History  Diagnosis Date  . Anemia   . Prostate cancer   . Basal cell cancer   . Hypertension   . Hyperlipidemia   . Restless leg   . GERD (gastroesophageal reflux disease)   . Brain tumor     Family History  family history includes Dementia in his father; Parkinsonism in his mother.  Prior Rehab/Hospitalizations:  None   Current Medications  Current facility-administered medications:acetaminophen (TYLENOL) tablet 650 mg, 650 mg, Oral, Q4H PRN, Samella Parr, NP, 650 mg at 09/26/13 0517;  beta carotene w/minerals (OCUVITE) tablet 1 tablet, 1 tablet, Oral, Daily, Theodis Blaze, MD, 1 tablet at 10/03/13 1039;  bisacodyl (DULCOLAX) suppository 10 mg, 10 mg, Rectal, Daily PRN, Consuella Lose, MD calcium carbonate (TUMS - dosed in mg elemental calcium) chewable tablet 200 mg of elemental calcium, 1 tablet, Oral, PRN, Otilio Connors, MD;  darifenacin (ENABLEX) 24 hr tablet 7.5 mg, 7.5  mg, Oral, Daily, Theodis Blaze, MD, 7.5 mg at 10/03/13 1039;  dexamethasone (DECADRON) injection 2 mg, 2 mg, Intravenous, Q12H, Consuella Lose, MD, 2 mg at 10/03/13 1040 [START ON 10/05/2013] dexamethasone (DECADRON) tablet 1 mg, 1 mg, Oral, Q12H, Consuella Lose, MD;  docusate sodium (COLACE) capsule 100 mg, 100 mg, Oral, Daily, Melton Alar, PA-C, 100 mg at 10/03/13 1039;  feeding supplement (ENSURE COMPLETE) (ENSURE COMPLETE) liquid 237 mL, 237 mL, Oral, BID BM, Heather Cornelison Pitts, RD, 237 mL at 10/03/13 1045 ferrous sulfate tablet 325 mg, 325 mg, Oral, Q breakfast, Theodis Blaze, MD, 325 mg at 10/03/13 1038;  lactulose ([place]) 10 GM/15ML solution 20 g, 20 g, Oral, BID PRN, Kelvin Cellar, MD;  levETIRAcetam (KEPPRA) 500 mg in sodium chloride  0.9 % 100 mL IVPB, 500 mg, Intravenous, Q12H, Consuella Lose, MD, 500 mg at 10/03/13 0312;  lidocaine (LIDODERM) 5 % 1 patch, 1 patch, Transdermal, Q24H, Kelvin Cellar, MD, 1 patch at 10/03/13 1039 morphine 2 MG/ML injection 2 mg, 2 mg, Intravenous, Q4H PRN, Kelvin Cellar, MD, 2 mg at 09/28/13 2117;  ondansetron (ZOFRAN) injection 4 mg, 4 mg, Intravenous, Q6H PRN, Samella Parr, NP;  ondansetron [facility]) tablet 4 mg, 4 mg, Oral, Q4H PRN, Samella Parr, NP, 4 mg at 09/22/13 0946;  oxyCODONE (Oxy IR/ROXICODONE) immediate release tablet 5 mg, 5 mg, Oral, Q4H PRN, Samella Parr, NP, 5 mg at 09/29/13 0810 pantoprazole (PROTONIX) EC tablet 40 mg, 40 mg, Oral, Daily, Theodis Blaze, MD, 40 mg at 10/03/13 1039;  polyethylene glycol (MIRALAX / GLYCOLAX) packet 17 g, 17 g, Oral, Daily, Kelvin Cellar, MD, 17 g at 10/02/13 1044;  promethazine (PHENERGAN) tablet 12.5-25 mg, 12.5-25 mg, Oral, Q4H PRN, Consuella Lose, MD;  rOPINIRole (REQUIP) tablet 1 mg, 1 mg, Oral, Q1500, Samella Parr, NP, 1 mg at 10/02/13 1428 rOPINIRole (REQUIP) tablet 1 mg, 1 mg, Oral, QHS, Samella Parr, NP, 1 mg at 10/02/13 2129;  saccharomyces boulardii (FLORASTOR) capsule 250 mg, 250 mg, Oral, Daily, Theodis Blaze, MD, 250 mg at 10/03/13 1039;  senna (SENOKOT) tablet 8.6 mg, 1 tablet, Oral, BID, Consuella Lose, MD, 8.6 mg at 10/03/13 1040;  simvastatin (ZOCOR) tablet 40 mg, 40 mg, Oral, QPM, Theodis Blaze, MD, 40 mg at 10/02/13 1816 tamsulosin (FLOMAX) capsule 0.4 mg, 0.4 mg, Oral, QPC supper, Kelvin Cellar, MD, 0.4 mg at 10/02/13 1816;  traMADol (ULTRAM) tablet 50 mg, 50 mg, Oral, Q6H PRN, Samella Parr, NP, 50 mg at 09/27/13 1846  Patients Current Diet: General  Precautions / Restrictions Precautions Precautions: Fall Precaution Comments: L sided neglect Restrictions Weight Bearing Restrictions: No   Prior Activity Level Community (5-7x/wk): Went out daily up until 09/14, but gradually worsened until he was  limited on acitivities.  Home Assistive Devices / Equipment Home Assistive Devices/Equipment: Cane (specify quad or straight);Wheelchair (straight, front wheel) Home Equipment: Cane - single point;Walker - 4 wheels;Grab bars - tub/shower  Prior Functional Level Prior Function Level of Independence: Independent Comments: for the past week he has been trying to walk with a RW at home.    Current Functional Level Cognition  Overall Cognitive Status: Impaired/Different from baseline Current Attention Level: Sustained Orientation Level: Oriented X4 Following Commands: Follows one step commands with increased time Safety/Judgement: Decreased awareness of safety;Decreased awareness of deficits General Comments: decreased awareness L side    Extremity Assessment (includes Sensation/Coordination)          ADLs  Grooming: Wash/dry face;Teeth care;Minimal assistance;Shaving  Where Assessed - Grooming: Supported sitting Upper Body Bathing: Maximal assistance Where Assessed - Upper Body Bathing: Supported sitting Lower Body Bathing: Maximal assistance Where Assessed - Lower Body Bathing: Supported sit to stand Upper Body Dressing: Moderate assistance Where Assessed - Upper Body Dressing: Supported sitting Lower Body Dressing: Maximal assistance Where Assessed - Lower Body Dressing: Supported sit to Lobbyist: +2 Total assistance;Maximal assistance Toilet Transfer Method: Sit to stand;Stand pivot Science writer: Bedside commode Toileting - Clothing Manipulation and Hygiene: +2 Total assistance;Maximal assistance Where Assessed - Best boy and Hygiene: Sit to stand from 3-in-1 or toilet Tub/Shower Transfer Method: Not assessed Equipment Used: Gait belt Transfers/Ambulation Related to ADLs: +2 total A ADL Comments: Told pt to be using LUE. Pt used left hand to support basin for teeth care and to hold electric razor while right hand took top off.  Practiced UB dressing with gown. Educated on dressing technique.  Educated to position LUE on pillow and also to avoid sharp objects with LUE.  Pt able to stand at [facility] and hold onto chair with RUE and OT supported LUE (min guard otherwise).    Mobility  Bed Mobility: Rolling Left;Left Sidelying to Sit Rolling Left: 3: Mod assist Left Sidelying to Sit: 1: +2 Total assist;HOB elevated;2: Max assist Supine to Sit: With rails;HOB elevated;2: Max assist Sitting - Scoot to Edge of Bed: 3: Mod assist;With rail Sit to Supine: 2: Max assist;HOB flat Scooting to HOB: 2: Max assist    Transfers  Transfers: Sit to Stand;Stand to Sit Sit to Stand: 1: +2 Total assist;From bed;From chair/3-in-1;2: Max assist Stand to Sit: 1: +2 Total assist;2: Max assist;To chair/3-in-1 Transfer via Lift Equipment: Psychologist, clinical / Gait / Stairs / Emergency planning/management officer  Ambulation/Gait Ambulation/Gait Assistance: Not tested (comment)    Posture / Balance Static Sitting Balance Static Sitting - Balance Support: Feet supported;Right upper extremity supported Static Sitting - Level of Assistance: 5: Stand by assistance Dynamic Sitting Balance Dynamic Sitting - Balance Support: Left upper extremity supported;Feet unsupported;During functional activity Dynamic Sitting - Level of Assistance: 3: Mod assist Sitting balance - Comments: posterior lean Static Standing Balance Static Standing - Balance Support: Bilateral upper extremity supported Static Standing - Level of Assistance: 3: Mod assist    Special needs/care consideration BiPAP/CPAP No CPM No Continuous Drip IV No Dialysis No      Life Vest No Oxygen No Special Bed No Trach Size No Wound Vac (area) No      Skin Has a stapled incision mid scalp area post craniotomy                            Bowel mgmt: Had BM 10/03/13.  Tends to be constipated Bladder mgmt: Currently with foley catheter.  Has tried voiding, but cannot.  May ultimately need urologist if  unable to urinate Diabetic mgmt No    Previous Home Environment Living Arrangements: Spouse/significant other Available Help at Discharge: Family Type of Home: House Home Layout: One level Home Access: Stairs to enter Entrance Stairs-Rails: Left Entrance Stairs-Number of Steps: 4 Bathroom Shower/Tub: Chiropodist: [place]: No Additional Comments: Pt states that his balance has been decreasing over the last 2 weeks  Discharge Living Setting Plans for Discharge Living Setting: Patient's home;House;Lives with (comment) (Lives with wife) Type of Home at Discharge: House Discharge Home Layout: One level Discharge Home Access: Stairs to enter Entrance [employer]  of Steps: 4-5 steps Does the patient have any problems obtaining your medications?: No  Social/Family/Support Systems Patient Roles: Spouse;Parent Contact Information: Jailan Trimm - wife (h) [phone] ([phone] Anticipated Caregiver: Wife Ability/Limitations of Caregiver: Wife is retired and can Veterinary surgeon Availability: 24/7 Discharge Plan Discussed with Primary Caregiver: Yes Is Caregiver In Agreement with Plan?: Yes Does Caregiver/Family have Issues with Lodging/Transportation while Pt is in Rehab?: No Has 2 sons who work.  One lives in [place] and the other lives in [place].  Goals/Additional Needs Patient/Family Goal for Rehab: PT/OT S/Min A, ST mod I goals Expected length of stay: 12-14 days Cultural Considerations: [employer] Dietary Needs: Regular diet, thin liquids Equipment Needs: TBD Pt/Family Agrees to Admission and willing to participate: Yes Program Orientation Provided & Reviewed with Pt/Caregiver Including Roles  & Responsibilities: Yes  Decrease burden of Care through IP rehab admission:  N/A  Possible need for SNF placement upon discharge: Not planned  Patient Condition: This patient's medical and functional status has changed since  the consult dated: 09/30/13 in which the Rehabilitation Physician determined and documented that the patient's condition is appropriate for intensive rehabilitative care in an inpatient rehabilitation facility. See "History of Present Illness" (above) for medical update. Functional changes are: Currently requiring max assist + 2 for transfers. Patient's medical and functional status update has been discussed with the Rehabilitation physician and patient remains appropriate for inpatient rehabilitation. Will admit to inpatient rehab today.  Preadmission Screen Completed By:  Retta Diones, 10/03/2013 1:58 PM ______________________________________________________________________   Discussed status with Dr. Letta Pate on 10/03/13 at 1406 and received telephone approval for admission today.  Admission Coordinator:  Retta Diones, time1408/Date01/15/15

## 2013-10-03 NOTE — Progress Notes (Signed)
UR complete.  Araya Roel RN, MSN 

## 2013-10-03 NOTE — Progress Notes (Signed)
Pt. Arrived to Rehab @ 1800.  Report received from [place], [place].  VSS; call bell in reach, bed alarm set.  Pt. Oriented to rehab unit.

## 2013-10-03 NOTE — H&P (Signed)
Physical Medicine and Rehabilitation Admission H&P  Chief Complaint   Patient presents with   .  Fall   :  Chief complaint: Headache  CWC:BJSEG [name]. is Paul 80 y.o. right-handed male with known history of large meningioma. Admitted 09/20/2013 with increasing left-sided weakness as well as recent fall. Latest MRI scan showed some increased mass effect. Underwent stereotactic right frontoparietal craniotomy for resection of meningioma 09/27/2013 per Dr. Kathyrn Sheriff. Followup MRI of the brain 09/29/2013 showed complete resection of the main tumor mass although there did appear to be some small amount of residual tumor within the superior sagittal sinus. Maintained on Decadron protocol and tapered to 2 mg every 8 hours 10/01/2013. He was tolerating Paul regular diet. Keppra added for seizure prophylaxis. Acute blood loss anemia 9.4 maintained on iron supplement. Physical and occupational therapy evaluations completed with recommendations of physical medicine rehabilitation consult to consider inpatient rehabilitation services. Patient was felt to be Paul good candidate for inpatient rehabilitation services was admitted for Paul comprehensive rehabilitation program there  ROS Review of Systems  Gastrointestinal:  GERD  Musculoskeletal: Positive for falls, joint pain and myalgias.  Neurological: Positive for dizziness and headaches.  Restless leg syndrome  All other systems reviewed and are negative  Past Medical History   Diagnosis  Date   .  Anemia    .  Prostate cancer    .  Basal cell cancer    .  Hypertension    .  Hyperlipidemia    .  Restless leg    .  GERD (gastroesophageal reflux disease)    .  Brain tumor     Past Surgical History   Procedure  Laterality  Date   .  Joint replacement       right knee replacement   .  Bilateral cateract surgery     .  Vasectomy     .  Tonsillectomy     .  Colonoscopy       colon polyp removed   .  Craniotomy  Right  09/27/2013     Procedure: RIGHT  FRONTAL CRANIOTOMY FOR TUMOR RESECTION ; Surgeon: Consuella Lose, MD; Location: [facility]; Service: Neurosurgery; Laterality: Right;    Family History   Problem  Relation  Age of Onset   .  Parkinsonism  Mother    .  Dementia  Father     Social History: reports that he has never smoked. He quit smokeless tobacco use about 40 years ago. His smokeless tobacco use included Chew. He reports that he does not drink alcohol or use illicit drugs.  Allergies:  Allergies   Allergen  Reactions   .  Asa [Aspirin]  Nausea Only   .  Vicodin [Hydrocodone-Acetaminophen]  Nausea Only    Medications Prior to Admission   Medication  Sig  Dispense  Refill   .  beta carotene w/minerals (OCUVITE) tablet  Take 1 tablet by mouth daily.     .  ferrous sulfate 325 (65 FE) MG EC tablet  Take 325 mg by mouth daily with breakfast.     .  Omega-3 Fatty Acids (FISH OIL) 1000 MG CAPS  Take 1,000 mg by mouth every morning.     Marland Kitchen  omeprazole (PRILOSEC) 20 MG capsule  Take 20 mg by mouth daily.     Marland Kitchen  rOPINIRole (REQUIP) 2 MG tablet  Take 2 mg by mouth at bedtime.     .  saccharomyces boulardii (FLORASTOR) 250 MG capsule  Take 250 mg by mouth daily.     .  simvastatin (ZOCOR) 40 MG tablet  Take 40 mg by mouth every evening.     .  solifenacin (VESICARE) 5 MG tablet  Take 5 mg by mouth daily.     .  Tamsulosin HCl (FLOMAX) 0.4 MG CAPS  Take 0.4 mg by mouth daily.     .  valsartan-hydrochlorothiazide (DIOVAN-HCT) 160-12.5 MG per tablet  Take 1 tablet by mouth daily.     .  vitamin B-12 (CYANOCOBALAMIN) 100 MCG tablet  Take 300 mcg by mouth daily.      Home:  Home Living  Family/patient expects to be discharged to:: Inpatient rehab  Living Arrangements: Spouse/significant other  Available Help at Discharge: Family  Type of Home: House  Home Access: Stairs to enter  [employer] of Steps: 4  Entrance Stairs-Rails: Left  Home Layout: [place] - single point;Walker - 4 wheels;Grab  bars - tub/shower  Additional Comments: Pt states that his balance has been decreasing over the last 2 weeks  Functional History:  Prior Function  Comments: for the past week he has been trying to walk with Paul RW at home.  Functional Status:  Mobility:  Bed Mobility  Bed Mobility: Supine to Sit;Sitting - Scoot to Edge of Bed  Supine to Sit: With rails;HOB elevated;2: Max assist  Sitting - Scoot to Edge of Bed: 3: Mod assist;With rail  Sit to Supine: 2: Max assist;HOB flat  Scooting to HOB: 2: Max assist  Transfers  Transfers: Sit to Stand;Stand to Sit  Sit to Stand: 3: Mod assist;2: Max assist;From bed  Stand to Sit: 3: Mod assist;Without upper extremity assist;With armrests;To chair/3-in-1  Transfer via Lift Equipment: Stedy  Ambulation/Gait  Ambulation/Gait Assistance: Not tested (comment)   ADL:  ADL  Grooming: Moderate assistance  Where Assessed - Grooming: Supported sitting  Upper Body Bathing: Maximal assistance  Where Assessed - Upper Body Bathing: Supported sitting  Lower Body Bathing: Maximal assistance  Where Assessed - Lower Body Bathing: Supported sit to stand  Upper Body Dressing: Maximal assistance  Where Assessed - Upper Body Dressing: Supported sitting  Lower Body Dressing: Maximal assistance  Where Assessed - Lower Body Dressing: Supported sit to Retail buyer: Maximal assistance;Simulated  Armed forces technical officer Method: Development worker, international aid: Raised toilet seat with arms (or 3-in-1 over toilet)  Tub/Shower Transfer Method: Not assessed  Equipment Used: Gait belt  Transfers/Ambulation Related to ADLs: difficulty with upright posture. vc for hand placement and sequence  ADL Comments: significant decline in function  Cognition:  Cognition  Overall Cognitive Status: Impaired/Different from baseline  Orientation Level: Oriented X4  Cognition  Arousal/Alertness: Awake/alert  Behavior During Therapy: WFL for tasks assessed/performed  Overall  Cognitive Status: Impaired/Different from baseline  Area of Impairment: Attention;Memory;Following commands;Safety/judgement;Awareness;Problem solving  Current Attention Level: Sustained  Memory: Decreased short-term memory;Decreased recall of precautions  Following Commands: Follows one step commands with increased time  Safety/Judgement: Decreased awareness of safety;Decreased awareness of deficits  Awareness: Intellectual  Problem Solving: Slow processing;Decreased initiation;Difficulty sequencing;Requires verbal cues;Requires tactile cues  General Comments: decreased awareness L side  Physical Exam:  Blood pressure 122/52, pulse 77, temperature 97.1 F (36.2 C), temperature source Oral, resp. rate 18, height 5\' 8"  (1.727 m), weight 96.1 kg (211 lb 13.8 oz), SpO2 100.00%.  Physical Exam  Constitutional: He is oriented to person, place, and time. He appears well-developed.  Eyes:  Pupils reactive to light  Neck: Normal range of motion. Neck supple. No thyromegaly present.  Cardiovascular: Normal rate and regular rhythm.  Respiratory: Effort normal and breath sounds normal. No respiratory distress.  GI: Soft. Bowel sounds are normal. He exhibits no distension.  Neurological: He is alert and oriented to person, place, and time.  Follows commands.Shows some right gaze preferance  Skin:  Cranio site clean and dry  5/5 strength in the right deltoid, bicep, tricep, grip, hip flexor, knee extensors, ankle dorsi flexion plantar flexor  2//5 left finger flexors 2-/5 left deltoid bicep triceps  3 minus left hip knee extensor synergy, 2/5 left hamstrings, 2- toe flexors and extensors  Sensation reduced to light touch in the left upper but not left lower extremity  No results found for this or any previous visit (from the past 48 hour(s)).  No results found.  Post Admission Physician Evaluation:  1. Functional deficits secondary to Paul large right frontal meningiomat status post  resection. 2. Patient is admitted to receive collaborative, interdisciplinary care between the physiatrist, rehab nursing staff, and therapy team. 3. Patient's level of medical complexity and substantial therapy needs in context of that medical necessity cannot be provided at Paul lesser intensity of care such as Paul SNF. 4. Patient has experienced substantial functional loss from his/her baseline which was documented above under the "Functional History" and "Functional Status" headings. Judging by the patient's diagnosis, physical exam, and functional history, the patient has potential for functional progress which will result in measurable gains while on inpatient rehab. These gains will be of substantial and practical use upon discharge in facilitating mobility and self-care at the household level. 5. Physiatrist will provide 24 hour management of medical needs as well as oversight of the therapy plan/treatment and provide guidance as appropriate regarding the interaction of the two. 6. 24 hour rehab nursing will assist with bladder management, bowel management, safety, skin/wound care, disease management, medication administration, pain management and patient education and help integrate therapy concepts, techniques,education, etc. 7. PT will assess and treat for/with: pre gait, gait training, endurance , safety, equipment, neuromuscular re education. Goals are: sup/min Paul mobility. 8. OT will assess and treat for/with: ADLs, Cognitive perceptual skills, Neuromuscular re education, safety, endurance, equipment. Goals are: Sup/minA . 9. SLP will assess and treat for/with: Assess cognition. Goals are: Mod I med management. 10. Case Management and Social Worker will assess and treat for psychological issues and discharge planning. 11. Team conference will be held weekly to assess progress toward goals and to determine barriers to discharge. 12. Patient will receive at least 3 hours of therapy per day at least  5 days per week. 13. ELOS:  16-18 days   14. Prognosis: excellent Medical Problem List and Plan:  1. right frontoparietal meningioma status post resection 09/27/2013 with left hemiplegia  2. DVT Prophylaxis/Anticoagulation: SCDs. Monitor for any signs of DVT  3. Pain Management: Lidoderm patch, oxycodone as needed. Monitor with increased mobility  4. Neuropsych: This patient is capable of making decisions on his own behalf.  5. Seizure prophylaxis. Keppra 500 mg every 12 hours. Monitor for any seizure activity  6. Acute blood loss anemia. Continue iron. Followup CBC  7. Restless leg syndrome. Requip  8. BPH. Flomax/Enablex as directed. Check PVRs x3  9. Hyperlipidemia. Zocor  10.GERD. Protonix   Charlett Blake M.D. [facility] FAAPM&R (Sports Med, Neuromuscular Med) Diplomate Am Board of Electrodiagnostic Med Diplomate Am Board of Pain Medicine Fellow Am Board of Interventional Pain  Physicians  10/01/2013

## 2013-10-04 ENCOUNTER — Inpatient Hospital Stay (HOSPITAL_COMMUNITY): Payer: Medicare Other | Admitting: *Deleted

## 2013-10-04 ENCOUNTER — Inpatient Hospital Stay (HOSPITAL_COMMUNITY): Payer: Medicare Other

## 2013-10-04 DIAGNOSIS — D320 Benign neoplasm of cerebral meninges: Secondary | ICD-10-CM

## 2013-10-04 DIAGNOSIS — G8110 Spastic hemiplegia affecting unspecified side: Secondary | ICD-10-CM

## 2013-10-04 LAB — COMPREHENSIVE METABOLIC PANEL
ALT: 59 U/L — AB (ref 0–53)
AST: 20 U/L (ref 0–37)
Albumin: 2.5 g/dL — ABNORMAL LOW (ref 3.5–5.2)
Alkaline Phosphatase: 56 U/L (ref 39–117)
BILIRUBIN TOTAL: 0.3 mg/dL (ref 0.3–1.2)
BUN: 20 mg/dL (ref 6–23)
CHLORIDE: 99 meq/L (ref 96–112)
CO2: 24 meq/L (ref 19–32)
CREATININE: 0.84 mg/dL (ref 0.50–1.35)
Calcium: 8.6 mg/dL (ref 8.4–10.5)
GFR, EST NON AFRICAN AMERICAN: 81 mL/min — AB (ref >90)
GLUCOSE: 105 mg/dL — AB (ref 70–99)
Potassium: 4.3 mEq/L (ref 3.7–5.3)
Sodium: 136 mEq/L — ABNORMAL LOW (ref 137–147)
Total Protein: 5.9 g/dL — ABNORMAL LOW (ref 6.0–8.3)

## 2013-10-04 LAB — CBC WITH DIFFERENTIAL/PLATELET
Basophils Absolute: 0 10*3/uL (ref 0.0–0.1)
Basophils Relative: 0 % (ref 0–1)
Eosinophils Absolute: 0 10*3/uL (ref 0.0–0.7)
Eosinophils Relative: 0 % (ref 0–5)
HEMATOCRIT: 30.1 % — AB (ref 39.0–52.0)
HEMOGLOBIN: 10 g/dL — AB (ref 13.0–17.0)
LYMPHS ABS: 1.9 10*3/uL (ref 0.7–4.0)
LYMPHS PCT: 14 % (ref 12–46)
MCH: 31.3 pg (ref 26.0–34.0)
MCHC: 33.2 g/dL (ref 30.0–36.0)
MCV: 94.1 fL (ref 78.0–100.0)
MONO ABS: 1.1 10*3/uL — AB (ref 0.1–1.0)
Monocytes Relative: 8 % (ref 3–12)
Neutro Abs: 10.4 10*3/uL — ABNORMAL HIGH (ref 1.7–7.7)
Neutrophils Relative %: 78 % — ABNORMAL HIGH (ref 43–77)
Platelets: 288 10*3/uL (ref 150–400)
RBC: 3.2 MIL/uL — AB (ref 4.22–5.81)
RDW: 13.4 % (ref 11.5–15.5)
WBC: 13.4 10*3/uL — AB (ref 4.0–10.5)

## 2013-10-04 LAB — URINALYSIS, ROUTINE W REFLEX MICROSCOPIC
BILIRUBIN URINE: NEGATIVE
Glucose, UA: NEGATIVE mg/dL
Hgb urine dipstick: NEGATIVE
Ketones, ur: NEGATIVE mg/dL
NITRITE: NEGATIVE
Protein, ur: NEGATIVE mg/dL
SPECIFIC GRAVITY, URINE: 1.013 (ref 1.005–1.030)
UROBILINOGEN UA: 0.2 mg/dL (ref 0.0–1.0)
pH: 7 (ref 5.0–8.0)

## 2013-10-04 LAB — URINE MICROSCOPIC-ADD ON

## 2013-10-04 MED ORDER — DEXAMETHASONE 0.5 MG PO TABS
1.0000 mg | ORAL_TABLET | Freq: Two times a day (BID) | ORAL | Status: AC
  Administered 2013-10-05 – 2013-10-07 (×4): 1 mg via ORAL
  Filled 2013-10-04 (×4): qty 2

## 2013-10-04 MED ORDER — DEXAMETHASONE 2 MG PO TABS
2.0000 mg | ORAL_TABLET | Freq: Two times a day (BID) | ORAL | Status: AC
  Administered 2013-10-04 – 2013-10-05 (×3): 2 mg via ORAL
  Filled 2013-10-04 (×3): qty 1

## 2013-10-04 MED ORDER — TRAZODONE HCL 50 MG PO TABS
50.0000 mg | ORAL_TABLET | Freq: Every evening | ORAL | Status: DC | PRN
  Administered 2013-10-05 – 2013-10-15 (×8): 50 mg via ORAL
  Filled 2013-10-04 (×8): qty 1

## 2013-10-04 NOTE — Evaluation (Signed)
Occupational Therapy Assessment and Plan  Patient Details  Name: [name] MRN: [id number] Date of Birth: 08/10/1931  OT Diagnosis: abnormal posture, flaccid hemiplegia and hemiparesis, hemiplegia affecting non-dominant side, muscle weakness (generalized) and swelling of limb Rehab Potential: Rehab Potential: Good ELOS:   2-3 weeks  Today's Date: 10/04/2013 Time:  -   0800-0910  (70 min)    Problem List:  Patient Active Problem List   Diagnosis Date Noted  . Meningioma 09/27/2013  . Unspecified constipation 09/26/2013  . Anemia 09/26/2013  . Dizziness 09/20/2013  . Fall at home 09/20/2013  . Dehydration 09/20/2013  . Orthostasis 09/20/2013  . CKD (chronic kidney disease), stage III 09/20/2013  . Brain mass 09/20/2013  . Gait instability 08/20/2013  . Hypokalemia 10/09/2012  . Ileus 10/08/2012  . Acute kidney injury 10/08/2012  . Hypertension 10/08/2012  . Hyperlipidemia 10/08/2012  . GERD (gastroesophageal reflux disease) 10/08/2012  . Restless leg syndrome 10/08/2012    Past Medical History:  Past Medical History  Diagnosis Date  . Anemia   . Prostate cancer   . Basal cell cancer   . Hypertension   . Hyperlipidemia   . Restless leg   . GERD (gastroesophageal reflux disease)   . Brain tumor    Past Surgical History:  Past Surgical History  Procedure Laterality Date  . Joint replacement      right knee replacement  . Bilateral cateract surgery    . Vasectomy    . Tonsillectomy    . Colonoscopy      colon polyp removed  . Craniotomy Right 09/27/2013    Procedure: RIGHT FRONTAL CRANIOTOMY FOR TUMOR RESECTION ;  Surgeon: Consuella Lose, MD;  Location: [facility];  Service: Neurosurgery;  Laterality: Right;    Assessment & Plan Clinical Impression:  [name]. is a 80 y.o. right-handed male with known history of large meningioma. Admitted 09/20/2013 with increasing left-sided weakness as well as recent fall. Latest MRI scan showed some increased mass  effect. Underwent stereotactic right frontoparietal craniotomy for resection of meningioma 09/27/2013 per Dr. Kathyrn Sheriff. Followup MRI of the brain 09/29/2013 showed complete resection of the main tumor mass although there did appear to be some small amount of residual tumor within the superior sagittal sinus. Maintained on Decadron protocol and tapered to 2 mg every 8 hours 10/01/2013. He was tolerating a regular diet. Keppra added for seizure prophylaxis. Acute blood loss anemia 9.4 maintained on iron supplement. Patient transferred to CIR on 10/03/2013 .    Patient currently requires total with basic self-care skills secondary to decreased cardiorespiratoy endurance, abnormal tone, unbalanced muscle activation, decreased coordination and decreased motor planning and decreased initiation and decreased problem solving.  Prior to hospitalization, patient could complete BADL with supervision.  Patient will benefit from skilled intervention to increase independence with basic self-care skills prior to discharge home with care partner.  Anticipate patient will require 24 hour supervision and follow up home health.  OT - End of Session Activity Tolerance: Tolerates 10 - 20 min activity with multiple rests OT Assessment Rehab Potential: Good   Skilled Therapeutic Intervention Addressed bed mobility, Transfers, NMRE. Pt cooperative and motivated to get better.     OT Evaluation Precautions/Restrictions  Restrictions Weight Bearing Restrictions: No       Pain none   Home Living/Prior Functioning Home Living Type of Home: House Home Access: Stairs to enter Entrance Stairs-Number of Steps: 4 Entrance Stairs-Rails: Left Home Layout: One level  Lives With: Spouse IADL  History Current License: Yes Mode of Transportation: Car Occupation: Retired Prior Function Level of Independence: Needs assistance with gait;Independent with basic ADLs  Able to Take Stairs?: Yes Driving: Yes Comments:  for the past week he has been trying to walk with a RW at home.   ADL ADL Grooming: Minimal assistance Where Assessed-Grooming: Sitting at sink Upper Body Bathing: Moderate assistance Where Assessed-Upper Body Bathing: Edge of bed Lower Body Bathing: Maximal assistance Where Assessed-Lower Body Bathing: Bed level Upper Body Dressing: Not assessed Lower Body Dressing: Not assessed Toileting: Dependent Where Assessed-Toileting: Bed level Toilet Transfer: Dependent Vision/Perception  Vision - History Baseline Vision: Wears glasses only for reading Visual History: Cataracts Patient Visual Report: No change from baseline Vision - Assessment Eye Alignment: Within Functional Limits Vision Assessment: Vision not tested Perception Perception: Impaired Inattention/Neglect: Does not attend to right side of body Praxis Praxis: Impaired Praxis Impairment Details: Initiation  Cognition Overall Cognitive Status: Impaired/Different from baseline Orientation Level: Oriented X4 Attention: Selective Selective Attention: Impaired Selective Attention Impairment: Verbal complex;Functional complex Sensation  Motor    Mobility max assist to total +2    Trunk/Postural Assessment  Cervical Assessment Cervical Assessment: Within Functional Limits Thoracic Assessment Thoracic Assessment: Exceptions to [facility] Thoracic AROM Overall Thoracic AROM: Due to premorid status Lumbar Assessment Lumbar Assessment: Exceptions to [facility] Lumbar AROM Overall Lumbar AROM:  (decreased lateral pelvic mobility) Postural Control Postural Control: Deficits on evaluation Postural Limitations:  (poor pelvic mobility)  Balance Balance Balance Assessed: Yes Static Sitting Balance Static Sitting - Balance Support: Right upper extremity supported;Feet supported Static Sitting - Level of Assistance: 5: Stand by assistance Static Sitting - Comment/# of Minutes: 15 Dynamic Sitting Balance Dynamic Sitting - Balance  Support: Right upper extremity supported;Feet supported Dynamic Sitting - Level of Assistance: 3: Mod assist Dynamic Sitting - Balance Activities: Lateral lean/weight shifting;Reaching across midline Sitting balance - Comments: posterior lean Static Standing Balance Static Standing - Balance Support: Right upper extremity supported;During functional activity Static Standing - Level of Assistance: 3: Mod assist Dynamic Standing Balance Dynamic Standing - Balance Support: During functional activity (+2) Dynamic Standing - Comments:  (total assist +2 with SPT) Extremity/Trunk Assessment RUE Assessment RUE Assessment: Within Functional Limits LUE Assessment LUE Assessment: Exceptions to [facility] (elbow flex/ext, forearm sup/pron, finer flex/ext) LUE AROM (degrees) Left Shoulder ABduction:  (30 AROM  Brinnstrom stage 3)  Fim:  See Fim     Refer to Care Plan for Long Term Goals  Recommendations for other services: Neuropsych  Discharge Criteria: Patient will be discharged from OT if patient refuses treatment 3 consecutive times without medical reason, if treatment goals not met, if there is a change in medical status, if patient makes no progress towards goals or if patient is discharged from hospital.  The above assessment, treatment plan, treatment alternatives and goals were discussed and mutually agreed upon: by patient  Lisa Roca 10/04/2013, 9:38 AM

## 2013-10-04 NOTE — Discharge Summary (Signed)
Physician Discharge Summary  Patient ID: [name] MRN: [id number] DOB/AGE: [birth year] 80 y.o.  Admit date: 09/19/2013 Discharge date: 10/04/2013  Admission Diagnoses:  1. Left hemiparesis     2. Right frontoparietal Meningioma  Discharge Diagnoses: Same Principal Problem:   Brain mass Active Problems:   Hypertension   Restless leg syndrome   Gait instability   Dizziness   Fall at home   Dehydration   Orthostasis   CKD (chronic kidney disease), stage III   Unspecified constipation   Anemia   Meningioma   Discharged Condition: Stable  Hospital Course:  [name] is a 80 y.o. male initially seen in the outpatient neurosurgery clinic with a large right frontoparietal meningioma scheduled for resection. He was admitted to the hospital approximately 5 days prior to his scheduled resection for worsening left-sided weakness. He was observed in the hospital by the medicine service and taken for resection on Friday, January 9. Surgery was completed without significant application and he was returned postoperatively to the neurointensive care unit. Immediately postoperatively the patient did have a fairly dense left hemiparesis but was awake and following commands on the right side. Vital signs remained stable and the patient was transferred from the intensive care unit to the general neuroscience floor for further observation and physical and occupational therapy. He continued to make good progress in his left-sided strength, and was able to stand with assistance as well as move his left arm with 2/5 strength. The patient did have a Foley catheter postoperatively which was removed, and the patient did develop some urinary retention which required reinsertion of the Foley. Prior to his discharge to rehabilitation, the Foley catheter was again removed for trial void.  Treatments: Surgery - Right craniotomy for resection of meningioma  Discharge Exam: Blood pressure 126/53, pulse 79,  temperature 97.3 F (36.3 C), temperature source Oral, resp. rate 20, height 5\' 8"  (1.727 m), weight 96.1 kg (211 lb 13.8 oz), SpO2 98.00%. Awake, alert, oriented Speech fluent, appropriate CN grossly intact 5/5 RUE/RLE 4/5 LLE, Wiggles left fingers, 2-3/5 left wrist flex/ext, 2/5 elbow flex/ext Wound c/d/i  Follow-up: Follow-up in my office [facility] [phone]) in 2-3 weeks  Disposition: 62-Rehab Facility   Future Appointments Provider Department Dept Phone   10/04/2013 1:30 PM Soundra Pilon, [place] [facility] [phone]       Medication List         beta carotene w/minerals tablet  Take 1 tablet by mouth daily.     dexamethasone 1 MG tablet  Commonly known as:  DECADRON  Take 1 tablet (1 mg total) by mouth every 12 (twelve) hours.  Start taking on:  10/05/2013     dexamethasone 4 MG/ML injection  Commonly known as:  DECADRON  Inject 0.5 mLs (2 mg total) into the vein every 12 (twelve) hours.     DSS 100 MG Caps  Take 100 mg by mouth daily.     ferrous sulfate 325 (65 FE) MG EC tablet  Take 325 mg by mouth daily with breakfast.     Fish Oil 1000 MG Caps  Take 1,000 mg by mouth every morning.     omeprazole 20 MG capsule  Commonly known as:  PRILOSEC  Take 20 mg by mouth daily.     oxyCODONE 5 MG immediate release tablet  Commonly known as:  Oxy IR/ROXICODONE  Take 1 tablet (5 mg total) by mouth every 4 (four) hours as  needed for severe pain.     rOPINIRole 2 MG tablet  Commonly known as:  REQUIP  Take 2 mg by mouth at bedtime.     saccharomyces boulardii 250 MG capsule  Commonly known as:  FLORASTOR  Take 250 mg by mouth daily.     simvastatin 40 MG tablet  Commonly known as:  ZOCOR  Take 40 mg by mouth every evening.     sodium chloride 0.9 % SOLN 100 mL with levETIRAcetam 500 MG/5ML SOLN 500 mg  Inject 500 mg into the vein every 12 (twelve) hours.     solifenacin 5 MG tablet  Commonly known  as:  VESICARE  Take 5 mg by mouth daily.     tamsulosin 0.4 MG Caps capsule  Commonly known as:  FLOMAX  Take 0.4 mg by mouth daily.     valsartan-hydrochlorothiazide 160-12.5 MG per tablet  Commonly known as:  DIOVAN-HCT  Take 1 tablet by mouth daily.     vitamin B-12 100 MCG tablet  Commonly known as:  CYANOCOBALAMIN  Take 300 mcg by mouth daily.         SignedConsuella Lose, C 10/04/2013, 1:00 PM

## 2013-10-04 NOTE — Care Management Note (Signed)
Inpatient [facility] Individual Statement of Services  Patient Name:  [name]  Date:  10/04/2013  Welcome to the [facility].  Our goal is to provide you with an individualized program based on your diagnosis and situation, designed to meet your specific needs.  With this comprehensive rehabilitation program, you will be expected to participate in at least 3 hours of rehabilitation therapies Monday-Friday, with modified therapy programming on the weekends.  Your rehabilitation program will include the following services:  Physical Therapy (PT), Occupational Therapy (OT), Speech Therapy (ST), 24 hour per day rehabilitation nursing, Therapeutic Recreaction (TR), Case Management (Social Worker), Rehabilitation Medicine, Nutrition Services and Pharmacy Services  Weekly team conferences will be held on Wednesday to discuss your progress.  Your Social Worker will talk with you frequently to get your input and to update you on team discussions.  Team conferences with you and your family in attendance may also be held.  Expected length of stay: 16-18 days Overall anticipated outcome: Supervision with cues-min level  Depending on your progress and recovery, your program may change. Your Social Worker will coordinate services and will keep you informed of any changes. Your Social Worker's name and contact numbers are listed  below.  The following services may also be recommended but are not provided by the [facility] will be made to provide these services after discharge if needed.  Arrangements include referral to agencies that provide these services.  Your insurance has been verified to be:  [employer] Your primary doctor is:  Dr Melinda Crutch  Pertinent information will be shared with your doctor and your insurance  company.  Social Worker:  Ovidio Kin, [place] or ([phone]  Information discussed with and copy given to patient by: Elease Hashimoto, 10/04/2013, 10:30 AM

## 2013-10-04 NOTE — Evaluation (Signed)
Physical Therapy Assessment and Plan  Patient Details  Name: [name] MRN: [id number] Date of Birth: [birth year]  PT Diagnosis: Abnormal posture, Abnormality of gait, Coordination disorder, Edema, Hemiparesis non-dominant and Muscle weakness Rehab Potential: Good ELOS: 2-3 weeks   Today's Date: 10/04/2013 Time: 10:40-11:40 (54mn) and 13:10-13:35 (257m)   Problem List:  Patient Active Problem List   Diagnosis Date Noted  . Meningioma 09/27/2013  . Unspecified constipation 09/26/2013  . Anemia 09/26/2013  . Dizziness 09/20/2013  . Fall at home 09/20/2013  . Dehydration 09/20/2013  . Orthostasis 09/20/2013  . CKD (chronic kidney disease), stage III 09/20/2013  . Brain mass 09/20/2013  . Gait instability 08/20/2013  . Hypokalemia 10/09/2012  . Ileus 10/08/2012  . Acute kidney injury 10/08/2012  . Hypertension 10/08/2012  . Hyperlipidemia 10/08/2012  . GERD (gastroesophageal reflux disease) 10/08/2012  . Restless leg syndrome 10/08/2012    Past Medical History:  Past Medical History  Diagnosis Date  . Anemia   . Prostate cancer   . Basal cell cancer   . Hypertension   . Hyperlipidemia   . Restless leg   . GERD (gastroesophageal reflux disease)   . Brain tumor    Past Surgical History:  Past Surgical History  Procedure Laterality Date  . Joint replacement      right knee replacement  . Bilateral cateract surgery    . Vasectomy    . Tonsillectomy    . Colonoscopy      colon polyp removed  . Craniotomy Right 09/27/2013    Procedure: RIGHT FRONTAL CRANIOTOMY FOR TUMOR RESECTION ;  Surgeon: NeConsuella LoseMD;  [facility];  Service: Neurosurgery;  Laterality: Right;    Assessment & Plan Clinical Impression: Paul Mowreyis a [age].o. right-handed male with known history of large meningioma. Admitted 09/20/2013 with increasing left-sided weakness as well as recent fall. Latest MRI scan showed some increased mass effect. Underwent stereotactic right  frontoparietal craniotomy for resection of meningioma 09/27/2013 per Dr. NuKathyrn SheriffFollowup MRI of the brain 09/29/2013 showed complete resection of the main tumor mass although there did appear to be some small amount of residual tumor within the superior sagittal sinus. Maintained on Decadron protocol and tapered to 2 mg every 8 hours 10/01/2013. He was tolerating a regular diet. Keppra added for seizure prophylaxis. Acute blood loss anemia 9.4 maintained on iron supplement. Physical and occupational therapy evaluations completed with recommendations of physical medicine rehabilitation consult to consider inpatient rehabilitation services. Patient was felt to be a good candidate for inpatient rehabilitation services was admitted for a comprehensive rehabilitation program there   Patient transferred to CIR on 10/03/2013 .   Patient currently requires max with mobility secondary to decreased cardiorespiratoy endurance, impaired timing and sequencing, unbalanced muscle activation and decreased coordination and decreased sitting balance, decreased standing balance, decreased postural control, hemiplegia and decreased balance strategies.  Prior to hospitalization, patient was modified independent  with mobility and lived with Spouse in a House home.  Home access is 4Stairs to enter.  Patient will benefit from skilled PT intervention to maximize safe functional mobility, minimize fall risk and decrease caregiver burden for planned discharge home with 24 hour supervision.  Anticipate patient will benefit from follow up HHPevelyt discharge.  PT - End of Session Activity Tolerance: Tolerates < 10 min activity, no significant change in vital signs Endurance Deficit: Yes Endurance Deficit Description: Pt fatigued following each transfer PT Assessment Rehab Potential: Good Barriers to Discharge: Decreased caregiver  support Barriers to Discharge Comments: Wife with h/o back problems PT Patient demonstrates  impairments in the following area(s): Balance;Edema;Endurance;Motor;Safety PT Transfers Functional Problem(s): Bed Mobility;Bed to Chair;Car;Furniture PT Locomotion Functional Problem(s): Ambulation;Wheelchair Mobility;Stairs PT Plan PT Intensity: Minimum of 1-2 x/day ,45 to 90 minutes PT Frequency: 5 out of 7 days PT Duration Estimated Length of Stay: 2-3 weeks PT Treatment/Interventions: Ambulation/gait training;Balance/vestibular training;Community reintegration;Discharge planning;Disease management/prevention;DME/adaptive equipment instruction;Functional mobility training;Neuromuscular re-education;Patient/family education;Psychosocial support;Skin care/wound management;Splinting/orthotics;Stair training;Therapeutic Activities;Therapeutic Exercise;UE/LE Strength taining/ROM;UE/LE Coordination activities;Visual/perceptual remediation/compensation;Wheelchair propulsion/positioning PT Transfers Anticipated Outcome(s): Supervision basic transfers PT Locomotion Anticipated Outcome(s): Supervision with RW x150' PT Recommendation Recommendations for Other Services: Neuropsych consult Follow Up Recommendations: Home health PT;24 hour supervision/assistance Patient destination: Home Equipment Recommended: To be determined Equipment Details: Pt has RW and cane, other needs TBD  Skilled Therapeutic Intervention 1:2 Tx initiated. Pt oriented to PT POC and goals for treatment. No overt L neglect noted, but pt with inattention to LUE at rest, needing safety cues.  Pt instructed in basic transfers mat<>WC with +2>>Max A for all components.  Performed static and dynamic sitting and standing tasks, see eval for levels of assist.  Sit<>stand in // bars with Mod A with cues for scooting and hand placement.  Pt able to use hemi-technique in WC, but needs steering assist.  Pt left up in WC in room with family and guests, all needs in reach.   2:2 Tx focused on NMR for LLE activation and gait with RW. Pt  fatigued from being up since this morning visiting, which limited his participation this afternoon. Pt was unaware that he was incontinent of bladder in depends at end of tx.  Staff pushed WC to gym for time.  WC>Nustep to pt's L with max A, to R with mod A. NMR for balance, weight shifting, and LLE advancement.  NMR on nustep with bil LEs only x5min for coordination and motor control for smooth steps, level 3. Pt was not particularly comfortable on this machine due to LLE tightness.  Gait training with RW and Max A x12' with NMR needed for posture, LLE stability, and LUE management.  Pt left up in WC with all needs in reach, nursing aware of assist needed for changing.   PT Evaluation Precautions/Restrictions Precautions Precautions: Fall Precaution Comments: L-sided weakness and inattention Restrictions Weight Bearing Restrictions: No General   Vital Signs  Pain Pain Assessment Pain Assessment: No/denies pain Home Living/Prior Functioning Home Living Living Arrangements: Spouse/significant other Available Help at Discharge: Family Type of Home: House Home Access: Stairs to enter Entrance Stairs-Number of Steps: 4 Entrance Stairs-Rails: Left Home Layout: One level Additional Comments: Uses both RW (in morning) and cane (as day progresses)   Lives With: Spouse Prior Function Level of Independence: Requires assistive device for independence (Wife assist dressing since Nov/Dec 2014)  Able to Take Stairs?: Yes Driving: Yes Comments: Increasing weakness and balance for several weeks Vision/Perception  Vision - History Baseline Vision: Wears glasses only for reading Visual History: Cataracts Patient Visual Report: No change from baseline Vision - Assessment Eye Alignment: Within Functional Limits Vision Assessment: Vision not tested Perception Perception: Impaired Inattention/Neglect: Does not attend to right side of body Praxis Praxis: Impaired Praxis Impairment Details:  Initiation  Cognition Overall Cognitive Status: Impaired/Different from baseline Arousal/Alertness: Awake/alert Orientation Level: Oriented X4 Attention: Selective Selective Attention: Impaired Selective Attention Impairment: Verbal complex;Functional complex Memory: Impaired Memory Impairment: Decreased recall of new information Awareness: Appears intact Problem Solving: Impaired Problem Solving Impairment: Verbal complex;Functional complex Safety/Judgment: Appears   intact Comments: Pt reports delayed processing.  Sensation Sensation Light Touch: Appears Intact Proprioception: Appears Intact Coordination Gross Motor Movements are Fluid and Coordinated: No Fine Motor Movements are Fluid and Coordinated: No Coordination and Movement Description: Decreased timing, accuracy, and excursion with LLE and LUE Motor  Motor Motor: Hemiplegia;Motor impersistence Motor - Skilled Clinical Observations: Decreased AROM due to LLE, slow movement with LLE as well. "Feels like a hundred pounds."  Mobility Bed Mobility Bed Mobility: Left Sidelying to Sit;Sit to Sidelying Left;Rolling Right;Rolling Left Rolling Right: 3: Mod assist Rolling Left: 3: Mod assist Left Sidelying to Sit: 2: Max assist;HOB flat Left Sidelying to Sit Details (indicate cue type and reason): Pt needing assist for LUE and LE management during rolling, lifting LE, and trunk lifting to sit' Supine to Sit: 2: Max assist Sit to Supine: 3: Mod assist Transfers Sit to Stand: 3: Mod assist;With armrests Stand to Sit: 4: Min assist Stand Pivot Transfers: 1: +2 Total assist Stand Pivot Transfer Details (indicate cue type and reason): Pt needing assist for lifting, steadying, safely completing turn, and hand placement.  Locomotion  Ambulation Ambulation: Yes Ambulation/Gait Assistance: 3: Mod assist Ambulation Distance (Feet): 15 Feet Assistive device: Parallel bars Ambulation/Gait Assistance Details: Steadying assist as well  as blocking LLE to prevent buckling Gait Gait: Yes Gait Pattern: Impaired Gait Pattern: Decreased stance time - left;Decreased step length - right;Step-to pattern;Decreased weight shift to left;Decreased dorsiflexion - left;Left circumduction;Trunk rotated posteriorly on left;Narrow base of support Stairs / Additional Locomotion Stairs: No Wheelchair Mobility Wheelchair Mobility: Yes Wheelchair Assistance: 4: Min Lexicographer: Right lower extremity;Right upper extremity Wheelchair Parts Management: Needs assistance Distance: 100  Trunk/Postural Assessment  Cervical Assessment Cervical Assessment: Within Functional Limits Thoracic Assessment Thoracic Assessment: Exceptions to [facility] (Kyphotic) Thoracic AROM Overall Thoracic AROM: Due to premorid status Lumbar Assessment Lumbar Assessment: Exceptions to [facility] (Decreased lateral and anterior pelvic mobility) Lumbar AROM Overall Lumbar AROM:  (decreased lateral pelvic mobility) Postural Control Postural Control: Deficits on evaluation Postural Limitations: Forward flexed with decreased core strength unsupported. Pt supporting trunk with RUE in sitting  Balance Balance Balance Assessed: Yes Static Sitting Balance Static Sitting - Balance Support: Bilateral upper extremity supported;Feet supported Static Sitting - Level of Assistance: 5: Stand by assistance Static Sitting - Comment/# of Minutes: 8 Dynamic Sitting Balance Dynamic Sitting - Balance Support: Bilateral upper extremity supported;Feet supported Dynamic Sitting - Level of Assistance: 3: Mod assist Dynamic Sitting - Balance Activities: Lateral lean/weight shifting;Reaching across midline Sitting balance - Comments: posterior bias due to fear of falling Static Standing Balance Static Standing - Balance Support: Right upper extremity supported;Left upper extremity supported Static Standing - Level of Assistance: 4: Min assist Static Standing - Comment/# of  Minutes: Stood in //bars x52mn with Min A for steadying Dynamic Standing Balance Dynamic Standing - Balance Support: Right upper extremity supported;Left upper extremity supported Dynamic Standing - Level of Assistance: 3: Mod assist Dynamic Standing - Balance Activities: Lateral lean/weight shifting;Forward lean/weight shifting Dynamic Standing - Comments: Weight shifting tasks  Extremity Assessment  RUE Assessment RUE Assessment: Within Functional Limits LUE Assessment LUE Assessment: Exceptions to [facility](Impaired elbow flex/ext, forearm sup/pron, finer flex/ext ) LUE AROM (degrees) Left Shoulder ABduction:  (30 AROM Brinnstrom stage 3) RLE Assessment RLE Assessment: Within Functional Limits LLE Assessment LLE Assessment: Exceptions to [facility] Strength LLE Overall Strength Comments: 2/5 hip flexion, 3/56 knee ext, 2/5 knee flex, 2+/5 ankle DF  FIM:  FIM - Locomotion: Wheelchair Distance: 100 FIM - Locomotion:  Ambulation Ambulation/Gait Assistance: 3: Mod assist   Refer to Care Plan for Long Term Goals  Recommendations for other services: Neuropsych  Discharge Criteria: Patient will be discharged from PT if patient refuses treatment 3 consecutive times without medical reason, if treatment goals not met, if there is a change in medical status, if patient makes no progress towards goals or if patient is discharged from hospital.  The above assessment, treatment plan, treatment alternatives and goals were discussed and mutually agreed upon: by patient and by family  Kennieth Rad, PT, DPT  10/04/2013, 12:04 PM

## 2013-10-04 NOTE — Progress Notes (Signed)
80 y.o. right-handed male with known history of large meningioma. Admitted 09/20/2013 with increasing left-sided weakness as well as recent fall. Latest MRI scan showed some increased mass effect. Underwent stereotactic right frontoparietal craniotomy for resection of meningioma 09/27/2013 per Dr. Kathyrn Sheriff. Followup MRI of the brain 09/29/2013 showed complete resection of the main tumor mass although there did appear to be some small amount of residual tumor within the superior sagittal sinus. Maintained on Decadron protocol and tapered to 2 mg every 8 hours 10/01/2013. He was tolerating a regular diet. Keppra added for seizure prophylaxis. Acute blood loss anemia 9.4 maintained on iron supplement  Subjective/Complaints: Moving Left arm better, poor sleep Review of Systems - Negative except dysuria and freq Objective: Vital Signs: Blood pressure 124/71, pulse 66, temperature 97.4 F (36.3 C), temperature source Oral, resp. rate 20, SpO2 96.00%. No results found. Results for orders placed during the hospital encounter of 10/03/13 (from the past 72 hour(s))  CBC WITH DIFFERENTIAL     Status: Abnormal   Collection Time    10/04/13  6:01 AM      Result Value Range   WBC 13.4 (*) 4.0 - 10.5 K/uL   RBC 3.20 (*) 4.22 - 5.81 MIL/uL   Hemoglobin 10.0 (*) 13.0 - 17.0 g/dL   HCT 30.1 (*) 39.0 - 52.0 %   MCV 94.1  78.0 - 100.0 fL   MCH 31.3  26.0 - 34.0 pg   MCHC 33.2  30.0 - 36.0 g/dL   RDW 13.4  11.5 - 15.5 %   Platelets 288  150 - 400 K/uL   Neutrophils Relative % 78 (*) 43 - 77 %   Neutro Abs 10.4 (*) 1.7 - 7.7 K/uL   Lymphocytes Relative 14  12 - 46 %   Lymphs Abs 1.9  0.7 - 4.0 K/uL   Monocytes Relative 8  3 - 12 %   Monocytes Absolute 1.1 (*) 0.1 - 1.0 K/uL   Eosinophils Relative 0  0 - 5 %   Eosinophils Absolute 0.0  0.0 - 0.7 K/uL   Basophils Relative 0  0 - 1 %   Basophils Absolute 0.0  0.0 - 0.1 K/uL     HEENT: normal Cardio: RRR and no murmur Resp: CTA B/L and unlabored GI: BS  positive and NT,ND Extremity:  Pulses positive and No Edema Skin:   Intact and Other Scalp wound with staples CDI Neuro: Alert/Oriented, Cranial Nerve II-XII normal, Abnormal Motor 2-/5 L delt, 4/5 Left Bi, tri, grip, 3- L HF, KE, ADF and Abnormal FMC Ataxic/ dec FMC Musc/Skel:  Normal Gen NAD   Assessment/Plan: 1. Functional deficits secondary to Right frontal meningioma s/p resection which require 3+ hours per day of interdisciplinary therapy in a comprehensive inpatient rehab setting. Physiatrist is providing close team supervision and 24 hour management of active medical problems listed below. Physiatrist and rehab team continue to assess barriers to discharge/monitor patient progress toward functional and medical goals. FIM:                                  Medical Problem List and Plan:  1. right frontoparietal meningioma status post resection 09/27/2013 with left hemiplegia  2. DVT Prophylaxis/Anticoagulation: SCDs. Monitor for any signs of DVT  3. Pain Management: Lidoderm patch, oxycodone as needed. Monitor with increased mobility  4. Neuropsych: This patient is capable of making decisions on his own behalf.  5. Seizure prophylaxis. Keppra  500 mg every 12 hours. Monitor for any seizure activity  6. Acute blood loss anemia. Continue iron. Followup CBC  7. Restless leg syndrome. Requip  8. BPH. Flomax/Enablex as directed. Check PVRs x3  9. Hyperlipidemia. Zocor  10.GERD. Protonix  11.  Dysuria- check UA  LOS (Days) 1 A FACE TO FACE EVALUATION WAS PERFORMED  Emberley Kral E 10/04/2013, 7:03 AM

## 2013-10-04 NOTE — Evaluation (Signed)
Speech Language Pathology Assessment and Plan  Patient Details  Name: [name] MRN: [id number] Date of Birth: 07/31/1931  SLP Diagnosis: Cognitive Impairments;Speech and Language deficits  Rehab Potential: Excellent ELOS: 16-18 days   Today's Date: 10/04/2013 Time: 0925-1015 Time Calculation (min): 50 min  Problem List:  Patient Active Problem List   Diagnosis Date Noted  . Meningioma 09/27/2013  . Unspecified constipation 09/26/2013  . Anemia 09/26/2013  . Dizziness 09/20/2013  . Fall at home 09/20/2013  . Dehydration 09/20/2013  . Orthostasis 09/20/2013  . CKD (chronic kidney disease), stage III 09/20/2013  . Brain mass 09/20/2013  . Gait instability 08/20/2013  . Hypokalemia 10/09/2012  . Ileus 10/08/2012  . Acute kidney injury 10/08/2012  . Hypertension 10/08/2012  . Hyperlipidemia 10/08/2012  . GERD (gastroesophageal reflux disease) 10/08/2012  . Restless leg syndrome 10/08/2012   Past Medical History:  Past Medical History  Diagnosis Date  . Anemia   . Prostate cancer   . Basal cell cancer   . Hypertension   . Hyperlipidemia   . Restless leg   . GERD (gastroesophageal reflux disease)   . Brain tumor    Past Surgical History:  Past Surgical History  Procedure Laterality Date  . Joint replacement      right knee replacement  . Bilateral cateract surgery    . Vasectomy    . Tonsillectomy    . Colonoscopy      colon polyp removed  . Craniotomy Right 09/27/2013    Procedure: RIGHT FRONTAL CRANIOTOMY FOR TUMOR RESECTION ;  Surgeon: Neelesh Nundkumar, MD;  Location: [facility];  Service: Neurosurgery;  Laterality: Right;    Assessment / Plan / Recommendation Clinical Impression  Pt is an 80 y.o. right-handed male with known h/o large meningioma. Admitted 09/20/13 with increasing left-sided weakness and recent fall. Latest MRI scan showed some increased mass effect. Underwent stereotactic right frontoparietal craniotomy for resection of meningioma 09/27/13.  F/u MRI of the brain 09/29/13 showed complete resection of the main tumor mass although there did appear to be some small amount of residual tumor within the superior sagittal sinus. Maintained on Decadron protocol and tapered to 2 mg every 8 hours 10/01/13. He was tolerating a regular diet. Keppra added for seizure prophylaxis. Acute blood loss anemia 9.4 maintained on iron supplement. PT/OT evaluations completed with recommendations of physical medicine rehabilitation consult to consider inpatient rehabilitation services. Patient was felt to be a good candidate for inpatient rehabilitation services was admitted for a comprehensive rehabilitation program 1/15; SLP cognitive-linguistic evaluation completed 1/16. Pt appears to have mild impairments with higher-level cognitive skills, including recall of new information, organization, and complex problem solving. While speech was 100% intelligible at the conversational level, pt reported concerns re: intermittent slurred speech, and would therefore benefit from education regarding speech intelligibility strategies. Pt will benefit from skilled SLP services to maximize functional independence prior to discharge.    SLP Assessment  Patient will need skilled Speech Lanaguage Pathology Services during CIR admission    Recommendations  Patient destination: Home Follow up Recommendations: Home Health SLP Equipment Recommended: None recommended by SLP    SLP Frequency 3 out of 7 days   SLP Treatment/Interventions Cognitive remediation/compensation;Cueing hierarchy;Environmental controls;Functional tasks;Internal/external aids;Medication managment;Patient/family education;Speech/Language facilitation    Pain Pain Assessment Pain Assessment: No/denies pain Prior Functioning Cognitive/Linguistic Baseline: Baseline deficits Baseline deficit details: patient reports difficulty remembering things at baseline Type of Home: House  Lives With: Spouse Available Help  at Discharge: Family  Short Term   Goals: Week 1: SLP Short Term Goal 1 (Week 1): Pt will recall 4 out of 4 speech intelligibility strategies with Mod I SLP Short Term Goal 2 (Week 1): Pt will demonstrate adequate medication management with supervision level cues SLP Short Term Goal 3 (Week 1): Pt will demonstrate complex problem solving during funcitonal task wtih supervision level cues  See FIM for current functional status Refer to Care Plan for Long Term Goals  Recommendations for other services: None  Discharge Criteria: Patient will be discharged from SLP if patient refuses treatment 3 consecutive times without medical reason, if treatment goals not met, if there is a change in medical status, if patient makes no progress towards goals or if patient is discharged from hospital.  The above assessment, treatment plan, treatment alternatives and goals were discussed and mutually agreed upon: by patient    Paiewonsky, M.A. CCC-SLP [phone]   Paiewonsky,  10/04/2013, 12:53 PM   

## 2013-10-04 NOTE — Progress Notes (Signed)
Patient information reviewed and entered into eRehab system by Modene Andy, RN, CRRN, PPS Coordinator.  Information including medical coding and functional independence measure will be reviewed and updated through discharge.     Per nursing patient was given "Data Collection Information Summary for Patients in Inpatient Rehabilitation Facilities with attached "Privacy Act Statement-Health Care Records" upon admission.  

## 2013-10-04 NOTE — Progress Notes (Signed)
Social Work Assessment and Plan Social Work Assessment and Plan  Patient Details  Name: [name] MRN: [id number] Date of Birth: 29-Dec-1930  Today's Date: 10/04/2013  Problem List:  Patient Active Problem List   Diagnosis Date Noted  . Meningioma 09/27/2013  . Unspecified constipation 09/26/2013  . Anemia 09/26/2013  . Dizziness 09/20/2013  . Fall at home 09/20/2013  . Dehydration 09/20/2013  . Orthostasis 09/20/2013  . CKD (chronic kidney disease), stage III 09/20/2013  . Brain mass 09/20/2013  . Gait instability 08/20/2013  . Hypokalemia 10/09/2012  . Ileus 10/08/2012  . Acute kidney injury 10/08/2012  . Hypertension 10/08/2012  . Hyperlipidemia 10/08/2012  . GERD (gastroesophageal reflux disease) 10/08/2012  . Restless leg syndrome 10/08/2012   Past Medical History:  Past Medical History  Diagnosis Date  . Anemia   . Prostate cancer   . Basal cell cancer   . Hypertension   . Hyperlipidemia   . Restless leg   . GERD (gastroesophageal reflux disease)   . Brain tumor    Past Surgical History:  Past Surgical History  Procedure Laterality Date  . Joint replacement      right knee replacement  . Bilateral cateract surgery    . Vasectomy    . Tonsillectomy    . Colonoscopy      colon polyp removed  . Craniotomy Right 09/27/2013    Procedure: RIGHT FRONTAL CRANIOTOMY FOR TUMOR RESECTION ;  Surgeon: Consuella Lose, MD;  Location: [facility];  Service: Neurosurgery;  Laterality: Right;   Social History:  reports that he has never smoked. He quit smokeless tobacco use about 40 years ago. His smokeless tobacco use included Chew. He reports that he does not drink alcohol or use illicit drugs.  Family / Support Systems Marital Status: Married Patient Roles: Spouse;Parent Spouse/Significant Other: Mardene Celeste  [phone]  [phone] Children: Two son's [place] and [employer] Other Supports: Friends Anticipated Caregiver: Wife Ability/Limitations of  Caregiver: Wife has back issues and needs to be careful.  Not able to do much physical care Caregiver Availability: 24/7 Family Dynamics: Close knit family who are supportive and involved with one another.  Wife will be the main caregiver and pt worries he will not be too much care for her.  His son's work but visit as often as they can.  Social History Preferred language: English Religion: Baptist Cultural Background: No issues Education: [employer] Read: Yes Write: Yes Employment Status: Retired Freight forwarder Issues: No issues Guardian/Conservator: None-according to MD pt is capable fo making his own decisions while here.  Will also include wife.   Abuse/Neglect Physical Abuse: Denies Verbal Abuse: Denies Sexual Abuse: Denies Exploitation of patient/patient's resources: Denies Self-Neglect: Denies  Emotional Status Pt's affect, behavior adn adjustment status: Pt is motivated and will do his best.  He has been told his attitude will take him far with his progress and he is trying to be positive and works with his arm and leg when not in therapies.  He has always been independent and wants to regain this.  He has been told 6-12 months recovery time and will be patient. Recent Psychosocial Issues: Other medical issues-but felt he managed them and remianed independent Pyschiatric History: No history-deferred depression screen due to pt felt not necessary due to doing well and at this time.  He will let worker know if feels he needs to speak to someone.  Will monitor thorugh team and myself. Substance Abuse History: No issues  Patient /  Family Perceptions, Expectations & Goals Pt/Family understanding of illness & functional limitations: Pt and wife have a good understanding of his condition and treatment plan.  He has spoken with MD when they round and can explain his surgery and the deficits resulting from this surgery.  He is doing well and hopeful to do better while  here. Premorbid pt/family roles/activities: Husband, father, grandfather, Home owner, [place] member, etc Anticipated changes in roles/activities/participation: resume Pt/family expectations/goals: Pt states: " I want to be mobile, so as not to burden my wife, she can't do any physical care of me."  Wife states: " I hope he will be walking the other things I can manage. "  Recruitment consultant: None Premorbid Home Care/DME Agencies: None Transportation available at discharge: Wife and son;s Resource referrals recommended: Support group (specify) (Support group)  Discharge Planning Living Arrangements: Spouse/significant other Support Systems: Spouse/significant other;Children;Other relatives;Friends/neighbors;Church/faith community Type of Residence: Private residence Insurance Resources: Multimedia programmer (specify) Psychologist, prison and probation services) Financial Resources: Radio broadcast assistant Screen Referred: No Living Expenses: Lives with family Money Management: Spouse;Patient Does the patient have any problems obtaining your medications?: No Home Management: Wife, pt does outside work Magazine features editor Plans: Return home with wife who can provide light assist, can not provide any physical care due to her back issues.  Will await team's evaluations to see if wife can provide the care team feels pt will need at discharge.  Wife to be here and observe in therapies frequently Social Work Anticipated Follow Up Needs: HH/OP;Support Group  Clinical Impression Very motivated gentleman who is willing to do his part to improve and regain his independence.  He works on his arm and leg while not in therapies and is encouraged by the progress he has made thus far. Will work with wife on what she can and can't do for pt, to come up with a safe discharge plan.  Elease Hashimoto 10/04/2013, 10:45 AM

## 2013-10-05 ENCOUNTER — Inpatient Hospital Stay (HOSPITAL_COMMUNITY): Payer: Medicare Other

## 2013-10-05 MED ORDER — ROPINIROLE HCL 1 MG PO TABS
1.0000 mg | ORAL_TABLET | Freq: Every day | ORAL | Status: DC
  Administered 2013-10-05 – 2013-10-18 (×14): 1 mg via ORAL
  Filled 2013-10-05 (×15): qty 1

## 2013-10-05 NOTE — Progress Notes (Signed)
80 y.o. right-handed male with known history of large meningioma. Admitted 09/20/2013 with increasing left-sided weakness as well as recent fall. Latest MRI scan showed some increased mass effect. Underwent stereotactic right frontoparietal craniotomy for resection of meningioma 09/27/2013 per Dr. Kathyrn Sheriff. Followup MRI of the brain 09/29/2013 showed complete resection of the main tumor mass although there did appear to be some small amount of residual tumor within the superior sagittal sinus. Maintained on Decadron protocol and tapered to 2 mg every 8 hours 10/01/2013. He was tolerating a regular diet. Keppra added for seizure prophylaxis. Acute blood loss anemia 9.4 maintained on iron supplement  Subjective/Complaints: Sacral area sore, has mepilex dressing Review of Systems - Negative except dysuria and freq Objective: Vital Signs: Blood pressure 118/78, pulse 71, temperature 98.1 F (36.7 C), temperature source Oral, resp. rate 18, SpO2 97.00%. No results found. Results for orders placed during the hospital encounter of 10/03/13 (from the past 72 hour(s))  CBC WITH DIFFERENTIAL     Status: Abnormal   Collection Time    10/04/13  6:01 AM      Result Value Range   WBC 13.4 (*) 4.0 - 10.5 K/uL   RBC 3.20 (*) 4.22 - 5.81 MIL/uL   Hemoglobin 10.0 (*) 13.0 - 17.0 g/dL   HCT 30.1 (*) 39.0 - 52.0 %   MCV 94.1  78.0 - 100.0 fL   MCH 31.3  26.0 - 34.0 pg   MCHC 33.2  30.0 - 36.0 g/dL   RDW 13.4  11.5 - 15.5 %   Platelets 288  150 - 400 K/uL   Neutrophils Relative % 78 (*) 43 - 77 %   Neutro Abs 10.4 (*) 1.7 - 7.7 K/uL   Lymphocytes Relative 14  12 - 46 %   Lymphs Abs 1.9  0.7 - 4.0 K/uL   Monocytes Relative 8  3 - 12 %   Monocytes Absolute 1.1 (*) 0.1 - 1.0 K/uL   Eosinophils Relative 0  0 - 5 %   Eosinophils Absolute 0.0  0.0 - 0.7 K/uL   Basophils Relative 0  0 - 1 %   Basophils Absolute 0.0  0.0 - 0.1 K/uL  COMPREHENSIVE METABOLIC PANEL     Status: Abnormal   Collection Time   10/04/13  6:01 AM      Result Value Range   Sodium 136 (*) 137 - 147 mEq/L   Potassium 4.3  3.7 - 5.3 mEq/L   Chloride 99  96 - 112 mEq/L   CO2 24  19 - 32 mEq/L   Glucose, Bld 105 (*) 70 - 99 mg/dL   BUN 20  6 - 23 mg/dL   Creatinine, Ser 0.84  0.50 - 1.35 mg/dL   Calcium 8.6  8.4 - 10.5 mg/dL   Total Protein 5.9 (*) 6.0 - 8.3 g/dL   Albumin 2.5 (*) 3.5 - 5.2 g/dL   AST 20  0 - 37 U/L   ALT 59 (*) 0 - 53 U/L   Alkaline Phosphatase 56  39 - 117 U/L   Total Bilirubin 0.3  0.3 - 1.2 mg/dL   GFR calc non Af Amer 81 (*) >90 mL/min   GFR calc Af Amer >90  >90 mL/min   Comment: (NOTE)     The eGFR has been calculated using the CKD EPI equation.     This calculation has not been validated in all clinical situations.     eGFR's persistently <90 mL/min signify possible Chronic Kidney  Disease.  URINALYSIS, ROUTINE W REFLEX MICROSCOPIC     Status: Abnormal   Collection Time    10/04/13  9:11 AM      Result Value Range   Color, Urine YELLOW  YELLOW   APPearance CLEAR  CLEAR   Specific Gravity, Urine 1.013  1.005 - 1.030   pH 7.0  5.0 - 8.0   Glucose, UA NEGATIVE  NEGATIVE mg/dL   Hgb urine dipstick NEGATIVE  NEGATIVE   Bilirubin Urine NEGATIVE  NEGATIVE   Ketones, ur NEGATIVE  NEGATIVE mg/dL   Protein, ur NEGATIVE  NEGATIVE mg/dL   Urobilinogen, UA 0.2  0.0 - 1.0 mg/dL   Nitrite NEGATIVE  NEGATIVE   Leukocytes, UA MODERATE (*) NEGATIVE  URINE MICROSCOPIC-ADD ON     Status: Abnormal   Collection Time    10/04/13  9:11 AM      Result Value Range   Squamous Epithelial / LPF RARE  RARE   WBC, UA 7-10  <3 WBC/hpf   RBC / HPF 0-2  <3 RBC/hpf   Bacteria, UA MANY (*) RARE     HEENT: normal Cardio: RRR and no murmur Resp: CTA B/L and unlabored GI: BS positive and NT,ND Extremity:  Pulses positive and No Edema Skin:   Intact and Other Scalp wound with staples CDI Neuro: Alert/Oriented, Cranial Nerve II-XII normal, Abnormal Motor 2-/5 L delt, 4/5 Left Bi, tri, grip, 3- L HF, KE,  ADF and Abnormal FMC Ataxic/ dec FMC Musc/Skel:  Normal Gen NAD   Assessment/Plan: 1. Functional deficits secondary to Right frontal meningioma s/p resection which require 3+ hours per day of interdisciplinary therapy in a comprehensive inpatient rehab setting. Physiatrist is providing close team supervision and 24 hour management of active medical problems listed below. Physiatrist and rehab team continue to assess barriers to discharge/monitor patient progress toward functional and medical goals. FIM: FIM - Bathing Bathing Steps Patient Completed: Chest;Abdomen;Right upper leg;Left upper leg Bathing: 2: Max-Patient completes 3-4 67f 10 parts or 25-49%  FIM - Upper Body Dressing/Undressing Upper body dressing/undressing: 0: Wears gown/pajamas-no public clothing FIM - Lower Body Dressing/Undressing Lower body dressing/undressing: 0: Wears Interior and spatial designer  FIM - Musician Devices: Grab bar or rail for support Toileting: 0: Activity did not occur  FIM - Radio producer Devices: Product manager Transfers: 0-Activity did not occur  FIM - Control and instrumentation engineer Devices: Arm rests Bed/Chair Transfer: 2: Supine > Sit: Max A (lifting assist/Pt. 25-49%);1: Two helpers  FIM - Locomotion: Wheelchair Distance: 100 Locomotion: Wheelchair: 1: Total Assistance/staff pushes wheelchair (Pt<25%) FIM - Locomotion: Ambulation Locomotion: Ambulation Assistive Devices: Administrator Ambulation/Gait Assistance: 2: Max assist Locomotion: Ambulation: 1: Travels less than 50 ft with maximal assistance (Pt: 25 - 49%)  Comprehension Comprehension Mode: Auditory Comprehension: 5-Follows basic conversation/direction: With no assist  Expression Expression Mode: Verbal Expression: 5-Expresses basic needs/ideas: With no assist  Social Interaction Social Interaction: 5-Interacts appropriately 90% of the time -  Needs monitoring or encouragement for participation or interaction.  Problem Solving Problem Solving: 5-Solves basic 90% of the time/requires cueing < 10% of the time  Memory Memory: 5-Recognizes or recalls 90% of the time/requires cueing < 10% of the time  Medical Problem List and Plan:  1. right frontoparietal meningioma status post resection 09/27/2013 with left hemiplegia  2. DVT Prophylaxis/Anticoagulation: SCDs. Monitor for any signs of DVT  3. Pain Management: Lidoderm patch, oxycodone as needed. Monitor with increased mobility  4. Neuropsych: This  patient is capable of making decisions on his own behalf.  5. Seizure prophylaxis. Keppra 500 mg every 12 hours. Monitor for any seizure activity  6. Acute blood loss anemia. Continue iron. Followup CBC  7. Restless leg syndrome. Requip takes at 5p and 9p at home  8. BPH. Flomax/Enablex as directed. Check PVRs x3  9. Hyperlipidemia. Zocor  10.GERD. Protonix  11.  Dysuria- check UA 12.  Sacral skin area poor mobility , add mattress overaly LOS (Days) 2 A FACE TO FACE EVALUATION WAS PERFORMED  [name] 10/05/2013, 7:32 AM

## 2013-10-05 NOTE — Progress Notes (Signed)
Speech Language Pathology Daily Session Note  Patient Details  Name: [name] MRN: [id number] Date of Birth: 1931/02/22  Today's Date: 10/05/2013 Time: 1000-1100 Time Calculation (min): 60 min  Short Term Goals: Week 1: SLP Short Term Goal 1 (Week 1): Pt will recall 4 out of 4 speech intelligibility strategies with Mod I SLP Short Term Goal 2 (Week 1): Pt will demonstrate adequate medication management with supervision level cues SLP Short Term Goal 3 (Week 1): Pt will demonstrate complex problem solving during funcitonal task wtih supervision level cues  Skilled Therapeutic Interventions: Skilled treatment focused on cognitive and speech goals. SLP facilitated session with education regarding speech intelligibility strategies. Pt was 100% intelligible at the conversational level, however reports that he believes his speech is not quite as clear as it was PTA. Pt finished checkbook balancing activity initiated on the previous date with Mod cues to recall where he had left off. Pt required Min cues for accuracy with mildly complex problem solving, working memory, and self-monitoring. Pt then completed 6-step sequencing cards with supervision level question cues for accuracy and Min cues for organization. Continue plan of care.   FIM:  Comprehension Comprehension Mode: Auditory Comprehension: 5-Follows basic conversation/direction: With no assist Expression Expression Mode: Verbal Expression: 5-Expresses basic needs/ideas: With no assist Social Interaction Social Interaction: 5-Interacts appropriately 90% of the time - Needs monitoring or encouragement for participation or interaction. Problem Solving Problem Solving: 5-Solves basic 90% of the time/requires cueing < 10% of the time Memory Memory: 4-Recognizes or recalls 75 - 89% of the time/requires cueing 10 - 24% of the time  Pain Pain Assessment Pain Assessment: No/denies pain  Therapy/Group: Individual Therapy   Germain Osgood, M.A. CCC-SLP [phone]   Germain Osgood 10/05/2013, 12:02 PM

## 2013-10-06 ENCOUNTER — Inpatient Hospital Stay (HOSPITAL_COMMUNITY): Payer: Medicare Other | Admitting: Physical Therapy

## 2013-10-06 ENCOUNTER — Inpatient Hospital Stay (HOSPITAL_COMMUNITY): Payer: Medicare Other | Admitting: *Deleted

## 2013-10-06 LAB — URINE CULTURE

## 2013-10-06 MED ORDER — CEPHALEXIN 250 MG PO CAPS
250.0000 mg | ORAL_CAPSULE | Freq: Three times a day (TID) | ORAL | Status: AC
Start: 2013-10-06 — End: 2013-10-12
  Administered 2013-10-06 – 2013-10-12 (×21): 250 mg via ORAL
  Filled 2013-10-06 (×21): qty 1

## 2013-10-06 NOTE — Progress Notes (Signed)
Physical Therapy Session Note  Patient Details  Name: [name] MRN: [id number] Date of Birth: 1931/03/18  Today's Date: 10/06/2013 Time: 1400-1444 Time Calculation (min): 44 min  Short Term Goals: Week 1:  PT Short Term Goal 1 (Week 1): Pt will propell WC x150' with S in controlled setting PT Short Term Goal 2 (Week 1): Pt will perform bed mobility with mod A  PT Short Term Goal 3 (Week 1): Pt will perform basic transfers with Mod A 4/5 trials PT Short Term Goal 4 (Week 1): Pt will ambulate 52' with RW and min A PT Short Term Goal 5 (Week 1): Pt will attempt stairs  Skilled Therapeutic Interventions/Progress Updates:  Pt was seen in rehab gym in the pm. Pt transferred stand pivot mod A to max A with verbal cues. Pt transferred edge of mat to supine with mod A. Pt performed LE exercises to include heel slides, hip abd/add and SAQs, R LE with 2 lbs and L LE with no weight. Followed by bridging and SLRs. Pt transferred supine to edge of mat with max A. Pt transferred edge of mat to w/c to L side with mod A.   Therapy Documentation Precautions:  Precautions Precautions: Fall Precaution Comments: L-sided weakness and inattention Restrictions Weight Bearing Restrictions: No General:   Pain: No c/o pain.   See FIM for current functional status  Therapy/Group: Individual Therapy  Dub Amis 10/06/2013, 3:36 PM

## 2013-10-06 NOTE — Progress Notes (Signed)
Occupational Therapy Note  Patient Details  Name: HILL MACKIE MRN: [id number] Date of Birth: 1931-04-23 Today's Date: 10/06/2013.  Time:1045-1130  (45 min) Pain:  none Individual session  1st session:  Addressed transfers, sit to stand, RUE NMRE.  Bathed at sink with mod assist for UB.  Donned shirt with max assist with instructional cues for technique.  Did sit to stand with total assist +2, pt = 50 % of work.  Stood for peri care for 2 minutes with mod assist for balance and mod cues to put weight through LUE while standing.  Pt transferred from wc to recliner going to left side with total +2 , pt = 50 %.   Left pt in recliner with call bell and phone in reach.     Lisa Roca 10/06/2013, 11:10 AM

## 2013-10-06 NOTE — Progress Notes (Signed)
80 y.o. right-handed male with known history of large meningioma. Admitted 09/20/2013 with increasing left-sided weakness as well as recent fall. Latest MRI scan showed some increased mass effect. Underwent stereotactic right frontoparietal craniotomy for resection of meningioma 09/27/2013 per Dr. Kathyrn Sheriff. Followup MRI of the brain 09/29/2013 showed complete resection of the main tumor mass although there did appear to be some small amount of residual tumor within the superior sagittal sinus. Maintained on Decadron protocol and tapered to 2 mg every 8 hours 10/01/2013. He was tolerating a regular diet. Keppra added for seizure prophylaxis. Acute blood loss anemia 9.4 maintained on iron supplement  Subjective/Complaints: Sacral area sore, has mepilex dressing Review of Systems - Negative except dysuria and freq Objective: Vital Signs: Blood pressure 118/70, pulse 66, temperature 97.4 F (36.3 C), temperature source Oral, resp. rate 17, SpO2 98.00%. No results found. Results for orders placed during the hospital encounter of 10/03/13 (from the past 72 hour(s))  CBC WITH DIFFERENTIAL     Status: Abnormal   Collection Time    10/04/13  6:01 AM      Result Value Range   WBC 13.4 (*) 4.0 - 10.5 K/uL   RBC 3.20 (*) 4.22 - 5.81 MIL/uL   Hemoglobin 10.0 (*) 13.0 - 17.0 g/dL   HCT 30.1 (*) 39.0 - 52.0 %   MCV 94.1  78.0 - 100.0 fL   MCH 31.3  26.0 - 34.0 pg   MCHC 33.2  30.0 - 36.0 g/dL   RDW 13.4  11.5 - 15.5 %   Platelets 288  150 - 400 K/uL   Neutrophils Relative % 78 (*) 43 - 77 %   Neutro Abs 10.4 (*) 1.7 - 7.7 K/uL   Lymphocytes Relative 14  12 - 46 %   Lymphs Abs 1.9  0.7 - 4.0 K/uL   Monocytes Relative 8  3 - 12 %   Monocytes Absolute 1.1 (*) 0.1 - 1.0 K/uL   Eosinophils Relative 0  0 - 5 %   Eosinophils Absolute 0.0  0.0 - 0.7 K/uL   Basophils Relative 0  0 - 1 %   Basophils Absolute 0.0  0.0 - 0.1 K/uL  COMPREHENSIVE METABOLIC PANEL     Status: Abnormal   Collection Time   10/04/13  6:01 AM      Result Value Range   Sodium 136 (*) 137 - 147 mEq/L   Potassium 4.3  3.7 - 5.3 mEq/L   Chloride 99  96 - 112 mEq/L   CO2 24  19 - 32 mEq/L   Glucose, Bld 105 (*) 70 - 99 mg/dL   BUN 20  6 - 23 mg/dL   Creatinine, Ser 0.84  0.50 - 1.35 mg/dL   Calcium 8.6  8.4 - 10.5 mg/dL   Total Protein 5.9 (*) 6.0 - 8.3 g/dL   Albumin 2.5 (*) 3.5 - 5.2 g/dL   AST 20  0 - 37 U/L   ALT 59 (*) 0 - 53 U/L   Alkaline Phosphatase 56  39 - 117 U/L   Total Bilirubin 0.3  0.3 - 1.2 mg/dL   GFR calc non Af Amer 81 (*) >90 mL/min   GFR calc Af Amer >90  >90 mL/min   Comment: (NOTE)     The eGFR has been calculated using the CKD EPI equation.     This calculation has not been validated in all clinical situations.     eGFR's persistently <90 mL/min signify possible Chronic Kidney  Disease.  URINALYSIS, ROUTINE W REFLEX MICROSCOPIC     Status: Abnormal   Collection Time    10/04/13  9:11 AM      Result Value Range   Color, Urine YELLOW  YELLOW   APPearance CLEAR  CLEAR   Specific Gravity, Urine 1.013  1.005 - 1.030   pH 7.0  5.0 - 8.0   Glucose, UA NEGATIVE  NEGATIVE mg/dL   Hgb urine dipstick NEGATIVE  NEGATIVE   Bilirubin Urine NEGATIVE  NEGATIVE   Ketones, ur NEGATIVE  NEGATIVE mg/dL   Protein, ur NEGATIVE  NEGATIVE mg/dL   Urobilinogen, UA 0.2  0.0 - 1.0 mg/dL   Nitrite NEGATIVE  NEGATIVE   Leukocytes, UA MODERATE (*) NEGATIVE  URINE CULTURE     Status: None   Collection Time    10/04/13  9:11 AM      Result Value Range   Specimen Description URINE, CATHETERIZED     Special Requests NONE     Culture  Setup Time       Value: 10/04/2013 15:04     Performed at [facility]       Value: >=100,000 COLONIES/ML     Performed at [facility]   Culture       Value: ESCHERICHIA COLI     Performed at [facility]   Report Status 10/06/2013 FINAL     Organism ID, Bacteria ESCHERICHIA COLI    URINE MICROSCOPIC-ADD ON     Status:  Abnormal   Collection Time    10/04/13  9:11 AM      Result Value Range   Squamous Epithelial / LPF RARE  RARE   WBC, UA 7-10  <3 WBC/hpf   RBC / HPF 0-2  <3 RBC/hpf   Bacteria, UA MANY (*) RARE     HEENT: normal Cardio: RRR and no murmur Resp: CTA B/L and unlabored GI: BS positive and NT,ND Extremity:  Pulses positive and No Edema Skin:   Intact and Other Scalp wound with staples CDI Neuro: Alert/Oriented, Cranial Nerve II-XII normal, Abnormal Motor 2-/5 L delt, 4/5 Left Bi, tri, grip, 3- L HF, KE, ADF and Abnormal FMC Ataxic/ dec FMC Musc/Skel:  Normal Gen NAD   Assessment/Plan: 1. Functional deficits secondary to Right frontal meningioma s/p resection which require 3+ hours per day of interdisciplinary therapy in a comprehensive inpatient rehab setting. Physiatrist is providing close team supervision and 24 hour management of active medical problems listed below. Physiatrist and rehab team continue to assess barriers to discharge/monitor patient progress toward functional and medical goals. FIM: FIM - Bathing Bathing Steps Patient Completed: Chest;Abdomen;Right upper leg;Left upper leg Bathing: 2: Max-Patient completes 3-4 71f 10 parts or 25-49%  FIM - Upper Body Dressing/Undressing Upper body dressing/undressing: 0: Wears gown/pajamas-no public clothing FIM - Lower Body Dressing/Undressing Lower body dressing/undressing: 0: Wears Interior and spatial designer  FIM - Musician Devices: Grab bar or rail for support Toileting: 0: Activity did not occur  FIM - Radio producer Devices: Product manager Transfers: 0-Activity did not occur  FIM - Control and instrumentation engineer Devices: Arm rests Bed/Chair Transfer: 2: Supine > Sit: Max A (lifting assist/Pt. 25-49%);1: Two helpers  FIM - Locomotion: Wheelchair Distance: 100 Locomotion: Wheelchair: 1: Total Assistance/staff pushes wheelchair (Pt<25%) FIM -  Locomotion: Ambulation Locomotion: Ambulation Assistive Devices: Administrator Ambulation/Gait Assistance: 2: Max assist Locomotion: Ambulation: 1: Travels less than 50 ft with maximal assistance (  Pt: 25 - 49%)  Comprehension Comprehension Mode: Auditory Comprehension: 5-Follows basic conversation/direction: With no assist  Expression Expression Mode: Verbal Expression: 5-Expresses basic needs/ideas: With no assist  Social Interaction Social Interaction: 5-Interacts appropriately 90% of the time - Needs monitoring or encouragement for participation or interaction.  Problem Solving Problem Solving: 5-Solves basic 90% of the time/requires cueing < 10% of the time  Memory Memory: 4-Recognizes or recalls 75 - 89% of the time/requires cueing 10 - 24% of the time  Medical Problem List and Plan:  1. right frontoparietal meningioma status post resection 09/27/2013 with left hemiplegia  2. DVT Prophylaxis/Anticoagulation: SCDs. Monitor for any signs of DVT  3. Pain Management: Lidoderm patch, oxycodone as needed. Monitor with increased mobility  4. Neuropsych: This patient is capable of making decisions on his own behalf.  5. Seizure prophylaxis. Keppra 500 mg every 12 hours. Monitor for any seizure activity  6. Acute blood loss anemia. Continue iron. Followup CBC  7. Restless leg syndrome. Requip takes at 5p and 9p at home  8. BPH. Flomax/Enablex as directed. Check PVRs x3  9. Hyperlipidemia. Zocor  10.GERD. Protonix  11.  Dysuria- +UA,start Keflex 12.  Sacral skin area poor mobility , add mattress overaly LOS (Days) 3 A FACE TO FACE EVALUATION WAS PERFORMED  Kurt Azimi E 10/06/2013, 9:29 AM

## 2013-10-06 NOTE — Progress Notes (Signed)
Physical Therapy Session Note  Patient Details  Name: [name] MRN: [id number] Date of Birth: Sep 16, 1931  Today's Date: 10/06/2013 Time: 0800-0859 Time Calculation (min): 59 min  Short Term Goals: Week 1:  PT Short Term Goal 1 (Week 1): Pt will propell WC x150' with S in controlled setting PT Short Term Goal 2 (Week 1): Pt will perform bed mobility with mod A  PT Short Term Goal 3 (Week 1): Pt will perform basic transfers with Mod A 4/5 trials PT Short Term Goal 4 (Week 1): Pt will ambulate 73' with RW and min A PT Short Term Goal 5 (Week 1): Pt will attempt stairs  Skilled Therapeutic Interventions/Progress Updates:  Pt was seen bedside in the am. Pt rolled R with side rail and mod A and rolled L with side rail and min A to assist with pulling up pants. Pt transferred supine to edge of bed with max A and verbal cues. Pt transferred edge of bed to w/c with mod to max A. Pt transported to rehab gym. In gym pt transferred w/c to mat, mat to w/c, both to the strong and weak side, initially with mod A and progressing to mod to max A as pt fatigued. Pt required verbal cues for technique. Switched pt's w/c to assist with increased independence with w/c mobility. Pt propelled w/c about 125 feet with R LE and R UE with S and verbal cues.    Therapy Documentation Precautions:  Precautions Precautions: Fall Precaution Comments: L-sided weakness and inattention Restrictions Weight Bearing Restrictions: No General:   Pain: No c/o pain.   See FIM for current functional status  Therapy/Group: Individual Therapy  Dub Amis 10/06/2013, 12:23 PM

## 2013-10-06 NOTE — Progress Notes (Signed)
Physical Therapy Note  Patient Details  Name: [name] MRN: [id number] Date of Birth: 11-25-1930 Today's Date: 10/06/2013  1315 -1400 (45 minutes) individual Pain; no reported pain Focus of treatment: gait training; therapeutic activities focused on left knee control in stance Treatment: Pt in recliner upon arrival; squat/pivot recliner to wc mod assist; wc mobility min assist 100 feet level surfaces using right extremities; sit to stand from wc mod assist ;gait 15 feet RW mod assist (assist with steering) followed closely by wc for safety ; standing  Placing horseshoes on basketball toward left to facilitate increased weight bearing left LE min assist (right UE); gait 10 feet RW min assist.     Avanell Banwart,JIM 10/06/2013, 1:39 PM

## 2013-10-06 NOTE — IPOC Note (Signed)
Overall Plan of Care [facility]) Patient Details Name: [name] MRN: [id number] DOB: [birth year]  Admitting Diagnosis: [facility] Problems: Active Problems:   Meningioma     Functional Problem List: Nursing Bowel;Perception;Endurance;Edema;Safety;Sensory;Skin Integrity;Medication Management  PT Balance;Edema;Endurance;Motor;Safety  OT Balance;Endurance;Motor;Pain;Perception;Safety;Sensory  SLP Cognition  TR         Basic ADL's: OT Grooming;Bathing;Dressing;Toileting;Other (comment) (transfers to toilet and tub)     Advanced  ADL's: OT       Transfers: PT Bed Mobility;Bed to Chair;Car;Furniture  OT Toilet;Tub/Shower     Locomotion: PT Ambulation;Wheelchair Mobility;Stairs     Additional Impairments: OT Fuctional Use of Upper Extremity  SLP Communication;Social Cognition expression Problem Solving;Memory  TR      Anticipated Outcomes Item Anticipated Outcome  Self Feeding independent  Swallowing      Basic self-care  minimal assist  Toileting  minimal assist   Bathroom Transfers minimal assist  Bowel/Bladder  pt will be continent of bowel and bladder  Transfers  Supervision basic transfers  Locomotion  Supervision with RW x150'  Communication  Mod I with speech intelligibility strategies  Cognition  Supervision with complex tasks  Pain  pain will be 2 or less  Safety/Judgment  patient will be min assist   Therapy Plan: PT Intensity: Minimum of 1-2 x/day ,45 to 90 minutes PT Frequency: 5 out of 7 days PT Duration Estimated Length of Stay: 2-3 weeks OT Intensity: Minimum of 1-2 x/day, 45 to 90 minutes OT Frequency: 5 out of 7 days OT Duration/Estimated Length of Stay: 2-3 weeks SLP Intensity: Minumum of 1-2 x/day, 30 to 90 minutes SLP Frequency: 3 out of 7 days SLP Duration/Estimated Length of Stay: 16-18 days       Team Interventions: Nursing Interventions Patient/Family Education;Disease Management/Prevention;Bowel  Management;Medication Management  PT interventions Ambulation/gait training;Balance/vestibular training;Community reintegration;Discharge planning;Disease management/prevention;DME/adaptive equipment instruction;Functional mobility training;Neuromuscular re-education;Patient/family education;Psychosocial support;Skin care/wound management;Splinting/orthotics;Stair training;Therapeutic Activities;Therapeutic Exercise;UE/LE Strength taining/ROM;UE/LE Coordination activities;Visual/perceptual remediation/compensation;Wheelchair propulsion/positioning  OT Interventions Balance/vestibular training;Community reintegration;Discharge planning;DME/adaptive equipment instruction;Functional mobility training;Functional electrical stimulation;Neuromuscular re-education;Pain management;Patient/family education;Self Care/advanced ADL retraining;Skin care/wound managment;Psychosocial support;Therapeutic Activities;Therapeutic Exercise;UE/LE Strength taining/ROM;UE/LE Coordination activities;Wheelchair propulsion/positioning  SLP Interventions Cognitive remediation/compensation;Cueing hierarchy;Environmental controls;Functional tasks;Internal/external aids;Medication managment;Patient/family education;Speech/Language facilitation  TR Interventions    SW/CM Interventions Discharge Planning;Psychosocial Support;Patient/Family Education    Team Discharge Planning: Destination: PT-Home ,OT- Home , SLP-Home Projected Follow-up: PT-Home health PT;24 hour supervision/assistance, OT-  Home health OT, SLP-Home Health SLP Projected Equipment Needs: PT-To be determined, OT- Tub/shower bench, SLP-None recommended by SLP Equipment Details: PT-Pt has RW and cane, other needs TBD, OT-  Patient/family involved in discharge planning: PT- Patient;Family member/caregiver,  OT-Patient, SLP-Patient  MD ELOS: 15-18 Medical Rehab Prognosis:  Excellent Assessment: 80 y.o. right-handed male with known history of large meningioma. Admitted  09/20/2013 with increasing left-sided weakness as well as recent fall. Latest MRI scan showed some increased mass effect. Underwent stereotactic right frontoparietal craniotomy for resection of meningioma 09/27/2013 per Dr. Kathyrn Sheriff. Followup MRI of the brain 09/29/2013 showed complete resection of the main tumor mass although there did appear to be some small amount of residual tumor within the superior sagittal sinus. Maintained on Decadron protocol and tapered to 2 mg every 8 hours 10/01/2013. He was tolerating a regular diet. Keppra added for seizure prophylaxis  Now requiring 24/7 Rehab RN,MD, as well as CIR level PT, OT and SLP.  Treatment team will focus on ADLs and mobility with goals set at [facility]    See Team Conference Notes for weekly updates to the  plan of care

## 2013-10-07 ENCOUNTER — Inpatient Hospital Stay (HOSPITAL_COMMUNITY): Payer: Medicare Other | Admitting: Occupational Therapy

## 2013-10-07 ENCOUNTER — Inpatient Hospital Stay (HOSPITAL_COMMUNITY): Payer: Medicare Other

## 2013-10-07 DIAGNOSIS — D320 Benign neoplasm of cerebral meninges: Secondary | ICD-10-CM

## 2013-10-07 DIAGNOSIS — G8110 Spastic hemiplegia affecting unspecified side: Secondary | ICD-10-CM

## 2013-10-07 NOTE — Progress Notes (Signed)
80 y.o. right-handed male with known history of large meningioma. Admitted 09/20/2013 with increasing left-sided weakness as well as recent fall. Latest MRI scan showed some increased mass effect. Underwent stereotactic right frontoparietal craniotomy for resection of meningioma 09/27/2013 per Dr. Kathyrn Sheriff. Followup MRI of the brain 09/29/2013 showed complete resection of the main tumor mass although there did appear to be some small amount of residual tumor within the superior sagittal sinus. Maintained on Decadron protocol and tapered to 2 mg every 8 hours 10/01/2013. He was tolerating a regular diet. Keppra added for seizure prophylaxis. Acute blood loss anemia 9.4 maintained on iron supplement  Subjective/Complaints: Pt increased strength in Left arm, encourage, concerned about going home "too soon" Review of Systems - Negative except dysuria and freq Objective: Vital Signs: Blood pressure 128/63, pulse 78, temperature 97.9 F (36.6 C), temperature source Oral, resp. rate 18, SpO2 99.00%. No results found. Results for orders placed during the hospital encounter of 10/03/13 (from the past 72 hour(s))  URINALYSIS, ROUTINE W REFLEX MICROSCOPIC     Status: Abnormal   Collection Time    10/04/13  9:11 AM      Result Value Range   Color, Urine YELLOW  YELLOW   APPearance CLEAR  CLEAR   Specific Gravity, Urine 1.013  1.005 - 1.030   pH 7.0  5.0 - 8.0   Glucose, UA NEGATIVE  NEGATIVE mg/dL   Hgb urine dipstick NEGATIVE  NEGATIVE   Bilirubin Urine NEGATIVE  NEGATIVE   Ketones, ur NEGATIVE  NEGATIVE mg/dL   Protein, ur NEGATIVE  NEGATIVE mg/dL   Urobilinogen, UA 0.2  0.0 - 1.0 mg/dL   Nitrite NEGATIVE  NEGATIVE   Leukocytes, UA MODERATE (*) NEGATIVE  URINE CULTURE     Status: None   Collection Time    10/04/13  9:11 AM      Result Value Range   Specimen Description URINE, CATHETERIZED     Special Requests NONE     Culture  Setup Time       Value: 10/04/2013 15:04     Performed at  [place]       Value: >=100,000 COLONIES/ML     Performed at [facility]   Culture       Value: ESCHERICHIA COLI     Performed at [facility]   Report Status 10/06/2013 FINAL     Organism ID, Bacteria ESCHERICHIA COLI    URINE MICROSCOPIC-ADD ON     Status: Abnormal   Collection Time    10/04/13  9:11 AM      Result Value Range   Squamous Epithelial / LPF RARE  RARE   WBC, UA 7-10  <3 WBC/hpf   RBC / HPF 0-2  <3 RBC/hpf   Bacteria, UA MANY (*) RARE     HEENT: normal Cardio: RRR and no murmur Resp: CTA B/L and unlabored GI: BS positive and NT,ND Extremity:  Pulses positive and No Edema Skin:   Intact and Other Scalp wound with staples CDI Neuro: Alert/Oriented, Cranial Nerve II-XII normal, Abnormal Motor 2-/5 L delt, 4/5 Left Bi, tri, grip, 3- L HF, KE, ADF and Abnormal FMC Ataxic/ dec FMC Musc/Skel:  Normal Gen NAD   Assessment/Plan: 1. Functional deficits secondary to Right frontal meningioma s/p resection which require 3+ hours per day of interdisciplinary therapy in a comprehensive inpatient rehab setting. Physiatrist is providing close team supervision and 24 hour management of active medical problems listed below. Physiatrist and  rehab team continue to assess barriers to discharge/monitor patient progress toward functional and medical goals. FIM: FIM - Bathing Bathing Steps Patient Completed: Chest;Abdomen;Right upper leg;Left upper leg;Right Arm;Front perineal area Bathing: 3: Mod-Patient completes 5-7 52f 10 parts or 50-74%  FIM - Upper Body Dressing/Undressing Upper body dressing/undressing steps patient completed: Thread/unthread right sleeve of pullover shirt/dresss Upper body dressing/undressing: 2: Max-Patient completed 25-49% of tasks FIM - Lower Body Dressing/Undressing Lower body dressing/undressing: 1: Total-Patient completed less than 25% of tasks  FIM - Musician Devices: Grab bar or rail  for support Toileting: 0: Activity did not occur  FIM - Radio producer Devices: Grab bars Toilet Transfers: 0-Activity did not occur  FIM - Control and instrumentation engineer Devices: Arm rests;Bed rails Bed/Chair Transfer: 2: Supine > Sit: Max A (lifting assist/Pt. 25-49%);2: Chair or W/C > Bed: Max A (lift and lower assist);2: Bed > Chair or W/C: Max A (lift and lower assist)  FIM - Locomotion: Wheelchair Distance: 100 Locomotion: Wheelchair: 2: Travels 50 - 149 ft with supervision, cueing or coaxing FIM - Locomotion: Ambulation Locomotion: Ambulation Assistive Devices: Administrator Ambulation/Gait Assistance: 2: Max assist Locomotion: Ambulation: 1: Travels less than 50 ft with maximal assistance (Pt: 25 - 49%)  Comprehension Comprehension Mode: Auditory Comprehension: 5-Follows basic conversation/direction: With no assist  Expression Expression Mode: Verbal Expression: 5-Expresses basic needs/ideas: With no assist  Social Interaction Social Interaction: 5-Interacts appropriately 90% of the time - Needs monitoring or encouragement for participation or interaction.  Problem Solving Problem Solving: 5-Solves basic 90% of the time/requires cueing < 10% of the time  Memory Memory: 5-Recognizes or recalls 90% of the time/requires cueing < 10% of the time  Medical Problem List and Plan:  1. right frontoparietal meningioma status post resection 09/27/2013 with left hemiplegia  2. DVT Prophylaxis/Anticoagulation: SCDs. Monitor for any signs of DVT  3. Pain Management: Lidoderm patch, oxycodone as needed. Monitor with increased mobility  4. Neuropsych: This patient is capable of making decisions on his own behalf.  5. Seizure prophylaxis. Keppra 500 mg every 12 hours. Monitor for any seizure activity  6. Acute blood loss anemia. Continue iron. Followup CBC  7. Restless leg syndrome. Requip takes at 5p and 9p at home  8. BPH.  Flomax/Enablex as directed. Check PVRs x3  9. Hyperlipidemia. Zocor  10.GERD. Protonix  11.  Dysuria- +UA,ecoli, Keflex pending sensitivity 12.  Sacral skin area poor mobility , add mattress overlay LOS (Days) 4 A FACE TO FACE EVALUATION WAS PERFORMED  KIRSTEINS,ANDREW E 10/07/2013, 7:47 AM

## 2013-10-07 NOTE — Progress Notes (Addendum)
Physical Therapy Note  Patient Details  Name: [name] MRN: [id number] Date of Birth: Feb 03, 1931 Today's Date: 10/07/2013 1003-1103 60 min individual therapy  No pain reported  Pt stated he needed to use toilet.  Toilet transfer stand pivot with mod assist.  Pt had BM and urinated.  Standing during hand washing with min assist, mod assist for attention to the L.  W/c propulsion in room with mod assist using bil LEs, L inattention.  W/c prep for transfer with min cue for L brake and L footrest.  Stand pivot transfer to R with min/mod assist.  neuromuscular re-education in supported sitting for L trunk/pelvic activation during trunk shortening/lengthening/rotating, reaching out of BOS to R and within BOS to L and forward.  Pelvic dissociation in sitting for scooting forward/backward.  Gait with RW x 15' with mod assist, focusing on wt shifting, upright posture, forward gaze, = step lengths.  Jaymar Loeber 10/07/2013, 10:09 AM

## 2013-10-07 NOTE — Progress Notes (Signed)
Occupational Therapy Session Notes  Patient Details  Name: [name] MRN: [id number] Date of Birth: Mar 28, 1931  Today's Date: 10/07/2013 Time: 0800-0900 and 130-210 Time Calculation (min): 60 min and 40 min  Short Term Goals: Week 1:  OT Short Term Goal 1 (Week 1): Pt. will bathe UB with min assist OT Short Term Goal 2 (Week 1): Pt. will bath LB with mod assist  OT Short Term Goal 3 (Week 1): Pt will go from sit to stand with mod assist. OT Short Term Goal 4 (Week 1): Pt. will transfer to toilet with mod assist.   OT Short Term Goal 5 (Week 1): Pt. will dress UB with mod assist.  Skilled Therapeutic Interventions/Progress Updates:  1)  Patient resting in bed upon arrival.  Engaged in self care retraining to include sponge bath, dress and self feeding to include assisting with set up of breakfast tray..  Focused session on attending to task, forced use of LUE, attention to left side of his body, functional mobility during BADL to include bed mobility and stand step transfer bed>w/c.  Patient is motivated toward independence, easily distracted, requires cues to use his LUE and to watch the hand while it is completing a task for greater success.  Patient required hand over hand at times to improve success with removing lids from breakfast items and stirring oatmeal.  Patient sitting in w/c eating breakfast with all items within reach.  2)  Patient seated in w/c upon arrival with his wife at his side.  Engaged in scooting forward in w/c with reciprocal hip movements, sit><stands with focus on using his LUE more with this task, ambulation with RW to hallway and back to sink for standing to brush teeth.  Patient was propelled to therapy bathroom in w/c while holding up BLE which required min cues to keep his LLE from drifting down.  Discussed with patient and wife to option of a tub bench in their tub/shower combo to be safe with transfers.  They report this might be a good option until they are able  to remodel bathroom (necessary due to floor is rotting and unstable in places to toilet and shower leaking.  They are thinking about replacing tub with a walk in shower.  Patient assisted to recliner at end of session with wife at his side.  Therapy Documentation Precautions:  Precautions Precautions: Fall Precaution Comments: L-sided weakness and inattention Restrictions Weight Bearing Restrictions: No Pain: 1)  Denies pain  2)  Reports LBP with extended standing.  Patient reports chronic LBP, rest and repositioned. ADL: See FIM for current functional status  Therapy/Group: Individual Therapy  Omarri Eich 10/07/2013, 9:17 AM

## 2013-10-07 NOTE — Progress Notes (Signed)
Speech Language Pathology Daily Session Note  Patient Details  Name: [name] MRN: [id number] Date of Birth: 29-Dec-1930  Today's Date: 10/07/2013 Time: 0915-1000 Time Calculation (min): 45 min  Short Term Goals: Week 1: SLP Short Term Goal 1 (Week 1): Pt will recall 4 out of 4 speech intelligibility strategies with Mod I SLP Short Term Goal 2 (Week 1): Pt will demonstrate adequate medication management with supervision level cues SLP Short Term Goal 3 (Week 1): Pt will demonstrate complex problem solving during funcitonal task wtih supervision level cues  Skilled Therapeutic Interventions: Skilled treatment focused on cognitive goals. SLP facilitated session with medication management task. Pt recalled home medications with Mod I, however required Max cues for recall of newly added medications. Overall, requiring Min cues for recall of medication list. Pt utilized his schedule to recall daily events and performed basic time calculations with Mod I. Continue plan of care.   FIM:  Comprehension Comprehension Mode: Auditory Comprehension: 5-Understands complex 90% of the time/Cues < 10% of the time Expression Expression Mode: Verbal Expression: 6-Expresses complex ideas: With extra time/assistive device Social Interaction Social Interaction: 5-Interacts appropriately 90% of the time - Needs monitoring or encouragement for participation or interaction. Problem Solving Problem Solving: 5-Solves basic problems: With no assist Memory Memory: 4-Recognizes or recalls 75 - 89% of the time/requires cueing 10 - 24% of the time FIM - Eating Eating Activity: 7: Complete independence:no helper  Pain  No/denies pain  Therapy/Group: Individual Therapy   Germain Osgood, M.A. CCC-SLP [phone]   Germain Osgood 10/07/2013, 1:25 PM

## 2013-10-08 ENCOUNTER — Inpatient Hospital Stay (HOSPITAL_COMMUNITY): Payer: Medicare Other | Admitting: Occupational Therapy

## 2013-10-08 ENCOUNTER — Inpatient Hospital Stay (HOSPITAL_COMMUNITY): Payer: Medicare Other | Admitting: *Deleted

## 2013-10-08 MED ORDER — POLYETHYLENE GLYCOL 3350 17 G PO PACK
17.0000 g | PACK | Freq: Every day | ORAL | Status: DC | PRN
  Filled 2013-10-08: qty 1

## 2013-10-08 NOTE — Progress Notes (Signed)
80 y.o. right-handed male with known history of large meningioma. Admitted 09/20/2013 with increasing left-sided weakness as well as recent fall. Latest MRI scan showed some increased mass effect. Underwent stereotactic right frontoparietal craniotomy for resection of meningioma 09/27/2013 per Dr. Kathyrn Sheriff. Followup MRI of the brain 09/29/2013 showed complete resection of the main tumor mass although there did appear to be some small amount of residual tumor within the superior sagittal sinus. Maintained on Decadron protocol and tapered to 2 mg every 8 hours 10/01/2013. He was tolerating a regular diet. Keppra added for seizure prophylaxis. Acute blood loss anemia 9.4 maintained on iron supplement  Subjective/Complaints: Pt increased strength in Left arm, encourage, concerned about going home "too soon" Review of Systems - Negative except dysuria and freq Objective: Vital Signs: Blood pressure 113/63, pulse 75, temperature 98.1 F (36.7 C), temperature source Oral, resp. rate 18, SpO2 96.00%. No results found. No results found for this or any previous visit (from the past 72 hour(s)).   HEENT: normal Cardio: RRR and no murmur Resp: CTA B/L and unlabored GI: BS positive and NT,ND Extremity:  Pulses positive and No Edema Skin:   Intact and Other Scalp wound with staples CDI Neuro: Alert/Oriented, Cranial Nerve II-XII normal, Abnormal Motor 2-/5 L delt, 4/5 Left Bi, tri, grip, 3- L HF, KE, ADF and Abnormal FMC Ataxic/ dec FMC Musc/Skel:  Normal Gen NAD   Assessment/Plan: 1. Functional deficits secondary to Right frontal meningioma s/p resection which require 3+ hours per day of interdisciplinary therapy in a comprehensive inpatient rehab setting. Physiatrist is providing close team supervision and 24 hour management of active medical problems listed below. Physiatrist and rehab team continue to assess barriers to discharge/monitor patient progress toward functional and medical  goals. FIM: FIM - Bathing Bathing Steps Patient Completed: Chest;Abdomen;Right upper leg;Left upper leg;Right Arm;Front perineal area;Left Arm Bathing: 3: Mod-Patient completes 5-7 67f 10 parts or 50-74%  FIM - Upper Body Dressing/Undressing Upper body dressing/undressing steps patient completed: Thread/unthread right sleeve of pullover shirt/dresss;Put head through opening of pull over shirt/dress Upper body dressing/undressing: 3: Mod-Patient completed 50-74% of tasks FIM - Lower Body Dressing/Undressing Lower body dressing/undressing steps patient completed: Thread/unthread right pants leg;Thread/unthread left pants leg;Don/Doff right shoe;Don/Doff left shoe Lower body dressing/undressing: 2: Max-Patient completed 25-49% of tasks  FIM - Toileting Toileting steps completed by patient: Adjust clothing prior to toileting;Adjust clothing after toileting Toileting Assistive Devices: Grab bar or rail for support Toileting: 3: Mod-Patient completed 2 of 3 steps  FIM - Radio producer Devices: Grab bars Toilet Transfers: 4-To toilet/BSC: Min A (steadying Pt. > 75%);3-From toilet/BSC: Mod A (lift or lower assist)  FIM - Bed/Chair Transfer Bed/Chair Transfer Assistive Devices: Arm rests Bed/Chair Transfer: 3: Supine > Sit: Mod A (lifting assist/Pt. 50-74%/lift 2 legs;3: Bed > Chair or W/C: Mod A (lift or lower assist)  FIM - Locomotion: Wheelchair Distance: 100 Locomotion: Wheelchair: 2: Travels 50 - 149 ft with supervision, cueing or coaxing FIM - Locomotion: Ambulation Locomotion: Ambulation Assistive Devices: Administrator Ambulation/Gait Assistance: 3: Mod assist Locomotion: Ambulation: 1: Travels less than 50 ft with moderate assistance (Pt: 50 - 74%)  Comprehension Comprehension Mode: Auditory Comprehension: 6-Follows complex conversation/direction: With extra time/assistive device  Expression Expression Mode: Verbal Expression: 6-Expresses complex  ideas: With extra time/assistive device  Social Interaction Social Interaction: 6-Interacts appropriately with others with medication or extra time (anti-anxiety, antidepressant).  Problem Solving Problem Solving: 5-Solves complex 90% of the time/cues < 10% of the time  Memory  Memory: 6-More than reasonable amt of time  Medical Problem List and Plan:  1. right frontoparietal meningioma status post resection 09/27/2013 with left hemiplegia  2. DVT Prophylaxis/Anticoagulation: SCDs. Monitor for any signs of DVT  3. Pain Management: Lidoderm patch, oxycodone as needed. Monitor with increased mobility  4. Neuropsych: This patient is capable of making decisions on his own behalf.  5. Seizure prophylaxis. Keppra 500 mg every 12 hours. Monitor for any seizure activity  6. Acute blood loss anemia. Continue iron. Followup CBC  7. Restless leg syndrome. Requip takes at 5p and 9p at home  8. BPH. Flomax/Enablex as directed. Check PVRs x3  9. Hyperlipidemia. Zocor  10.GERD. Protonix  11.  Dysuria- +UA,ecoli, Keflex sensitive 12.  Sacral skin area poor mobility , add mattress overlay LOS (Days) 5 A FACE TO FACE EVALUATION WAS PERFORMED  Laden Fieldhouse E 10/08/2013, 7:16 AM

## 2013-10-08 NOTE — Progress Notes (Signed)
Occupational Therapy Session Note  Patient Details  Name: DEANTE BLOUGH MRN: [id number] Date of Birth: 11/23/1930  Today's Date: 10/08/2013 Time: 1130-1200 Time Calculation (min): 30 min  Short Term Goals: Week 1:  OT Short Term Goal 1 (Week 1): Pt. will bathe UB with min assist OT Short Term Goal 2 (Week 1): Pt. will bath LB with mod assist  OT Short Term Goal 3 (Week 1): Pt will go from sit to stand with mod assist. OT Short Term Goal 4 (Week 1): Pt. will transfer to toilet with mod assist.   OT Short Term Goal 5 (Week 1): Pt. will dress UB with mod assist.  Skilled Therapeutic Interventions/Progress Updates:    1:1 Neuro muscular reeducation with focus on gross and fine motor control and manipulation with Left UE including grasp and release, controlled UE movements, transfer training w/c<>mat, decr abnormal left UE shoulder movements with functional use of UE, weight bearing through left UE with functional reach of right UE for obtaining objects, and performing PNF patterns with min A for quality of movement.   Therapy Documentation Precautions:  Precautions Precautions: Fall Precaution Comments: L-sided weakness and inattention Restrictions Weight Bearing Restrictions: No    Pain:  only c/o pain is with increased left shoulder flexion  See FIM for current functional status  Therapy/Group: Individual Therapy  [facility] 10/08/2013, 4:36 PM

## 2013-10-08 NOTE — Progress Notes (Signed)
Physical Therapy Session Note  Patient Details  Name: [name] MRN: [id number] Date of Birth: 1931-03-15  Today's Date: 10/08/2013 Time: 1015-1100 Time Calculation (min): 45 min  Short Term Goals: Week 1:  PT Short Term Goal 1 (Week 1): Pt will propell WC x150' with S in controlled setting PT Short Term Goal 2 (Week 1): Pt will perform bed mobility with mod A  PT Short Term Goal 3 (Week 1): Pt will perform basic transfers with Mod A 4/5 trials PT Short Term Goal 4 (Week 1): Pt will ambulate 55' with RW and min A PT Short Term Goal 5 (Week 1): Pt will attempt stairs  Skilled Therapeutic Interventions/Progress Updates:  Tx focused on safety with functional mobility, NMR/coordination, gait with RW, and stairs.  Pt needing to use restroom, using RW and min/mod to navigate in room with safety cues for L side hand placement and obstacle negotiation.  Pt demonstrating dynamic sitting balance for self care with min A. Pt propelled WC 2x150' with bil LEs only for increased coordination and timing, needing cues for LLE ext and Min A to avoid hand in doorway.  NMR during sit<>Stand transfers for anterior translation of trunk over BOS and cues for scooting.    Gait x100' with RW and min A straight path, Mod A during L turn and cues for safe hand placement on RW. Pt also given manual facilitation for L weight shift and L knee ext in stance. Pt needing seated rest breaks due to fatigue.   Performed 5 stairs with bil rails and up to Mod A to control descent due to narrowing BOS.  Pt left up in [facility] with wife present and all needs in reach.      Therapy Documentation Precautions:  Precautions Precautions: Fall Precaution Comments: L-sided weakness and inattention Restrictions Weight Bearing Restrictions: No    Pain: none     Locomotion : Ambulation Ambulation/Gait Assistance: 3: Mod assist Wheelchair Mobility Distance: 150   See FIM for current functional status  Therapy/Group:  Individual Therapy Kennieth Rad, PT, DPT   10/08/2013, 12:54 PM

## 2013-10-08 NOTE — Progress Notes (Signed)
Occupational Therapy Session Notes  Patient Details  Name: [name] MRN: [id number] Date of Birth: 1930/10/26  Today's Date: 10/08/2013 Time: 0800-0900 and 145-230 Time Calculation (min): 60 min and 45 min  Short Term Goals: Week 1:  OT Short Term Goal 1 (Week 1): Pt. will bathe UB with min assist OT Short Term Goal 2 (Week 1): Pt. will bath LB with mod assist  OT Short Term Goal 3 (Week 1): Pt will go from sit to stand with mod assist. OT Short Term Goal 4 (Week 1): Pt. will transfer to toilet with mod assist.   OT Short Term Goal 5 (Week 1): Pt. will dress UB with mod assist.  Skilled Therapeutic Interventions/Progress Updates:  1)  Patient resting in bed upon arrival.  Engaged in self care retraining to include shower and dressing.  Focused session on activity tolerance, sustained attention, dynamic balance with functional mobility and ambulating with RW to and from bathroom, attention to left and forced use of LUE.  Patient performed shower seated to include use of LH sponge to bathe feet and in stand to bathe peri area and buttocks while holding onto the grab bar.  Patient occasionally requires [facility] assist when attempting to use LUE.  More successful when he watches his left hand at work.  Patient required 3 vcs to position LUE safely during BADL tasks.  2)  Patient resting in w/c upon arrival with wife visiting.  Engaged in forced use of LUE to remove shoe strings from his tennis shoe.  Issued elastic laces and patient practiced donning shoes with supervision with right shoe and min assist with left shoe.  Patient propelled w/c with LLE only with mod assist fading to min-supervision.  Patient transferred w/c> recliner with mod assist sit>stand and supervision and mod cues for stand>sit without use of either hand!  Performed LUE AAROM exercises with patient learning to focus on eye-hand coordination.  Therapy Documentation Precautions:  Precautions Precautions: Fall Precaution  Comments: L-sided weakness and inattention Restrictions Weight Bearing Restrictions: No Pain: 1)  Denies pain  2)  Reports left shoulder pain/stretch with LUE AAROM exercises, repositioned and rest. ADL: See FIM for current functional status  Therapy/Group: Individual Therapy for both sessions  Natesha Hassey, [place] 10/08/2013, 10:17 AM

## 2013-10-09 ENCOUNTER — Inpatient Hospital Stay (HOSPITAL_COMMUNITY): Payer: Medicare Other

## 2013-10-09 ENCOUNTER — Inpatient Hospital Stay (HOSPITAL_COMMUNITY): Payer: Medicare Other | Admitting: *Deleted

## 2013-10-09 MED ORDER — DICLOFENAC SODIUM 1 % TD GEL
2.0000 g | Freq: Four times a day (QID) | TRANSDERMAL | Status: DC
Start: 2013-10-09 — End: 2013-10-19
  Administered 2013-10-09 – 2013-10-19 (×40): 2 g via TOPICAL
  Filled 2013-10-09: qty 100

## 2013-10-09 NOTE — Patient Care Conference (Signed)
Inpatient RehabilitationTeam Conference and Plan of Care Update Date: 10/09/2013   Time: 11;15 Am    Patient Name: [name]      Medical Record Number: [id number]  Date of Birth: 1930/10/07 Sex: Male         Room/Bed: 4W19C/4W19C-01 Payor Info: Payor: [place] / Plan: BLUE MEDICARE / Product Type: *No Product type* /    Admitting Diagnosis: Meningloma crani  Admit Date/Time:  10/03/2013  6:10 PM Admission Comments: No comment available   Primary Diagnosis:  <principal problem not specified> Principal Problem: <principal problem not specified>  Patient Active Problem List   Diagnosis Date Noted  . Meningioma 09/27/2013  . Unspecified constipation 09/26/2013  . Anemia 09/26/2013  . Dizziness 09/20/2013  . Fall at home 09/20/2013  . Dehydration 09/20/2013  . Orthostasis 09/20/2013  . CKD (chronic kidney disease), stage III 09/20/2013  . Brain mass 09/20/2013  . Gait instability 08/20/2013  . Hypokalemia 10/09/2012  . Ileus 10/08/2012  . Acute kidney injury 10/08/2012  . Hypertension 10/08/2012  . Hyperlipidemia 10/08/2012  . GERD (gastroesophageal reflux disease) 10/08/2012  . Restless leg syndrome 10/08/2012    Expected Discharge Date: Expected Discharge Date: 10/19/13  Team Members Present: Physician leading conference: Dr. Alysia Penna Social Worker Present: Ovidio Kin, LCSW Nurse Present: Other (comment) Nigel Bridgeman) PT Present: Raylene Everts, PT;Emily Rinaldo Cloud, PT OT Present: Simonne Come, OT;Kris Nira Retort, OT SLP Present: Gunnar Fusi, SLP PPS Coordinator present : Daiva Nakayama, RN, CRRN     Current Status/Progress Goal Weekly Team Focus  Medical   left hp improving, left neglect  improve L awareness  cueing with therapy   Bowel/Bladder   pt continent of bowel and bladder. increased frequency HS-condom cath used for comfort.  remain cont. of bowel and bladder  encourage use of urinal HS.     Swallow/Nutrition/ Hydration     WFL        ADL's   Overall Mod-Min assist with BADL  Overall Supervision-Min Assist for BADL tasks  activity tolerance, sustained attention, left visual spatial and body inattention, forced use of LUE, NMR, coordination and dynamic balance with BADL and functional mobility   Mobility   Mod A bed mobility and transfers, Min/mod A gait wtih RW x100', mod A stairs with 2 rails, S WC propulsion  S bed mobility and transfers, S gait x150'  and WC  dynamic balance, functional mobility, coordination and motor control, gait with RW and L-sided attention   Communication   pt with c/o decreased speech intelligiblity  Mod I  recall and use of speech intelligibility strategies   Safety/Cognition/ Behavioral Observations  Min cues for recall   supervision  complex problem solving, recall wtih external aids, medication management   Pain   pt complains of pain to left rib cage. ultram PRN  2 or less out of 10  monitor pain and medicate as needed   Skin   excoriated skin to left buttcheek- foam inplace; staples to head.   no further breakdown and stay free from infection  assess skin qshift      *See Care Plan and progress notes for long and short-term goals.  Barriers to Discharge: See above    Possible Resolutions to Barriers:  cont rehab    Discharge Planning/Teaching Needs:  HOme withwife who can provide 24 hr supervision level, here daily and observed in therapies.      Team Discussion:  Making good progress.  Left  inattention and balance issues.  Voltaren gel to left knee due to arthritis and limiting him in therapies.  Wife to come in next week to do family education  Revisions to Treatment Plan:  None   Continued Need for Acute Rehabilitation Level of Care: The patient requires daily medical management by a physician with specialized training in physical medicine and rehabilitation for the following conditions: Daily direction of a multidisciplinary  physical rehabilitation program to ensure safe treatment while eliciting the highest outcome that is of practical value to the patient.: Yes Daily medical management of patient stability for increased activity during participation in an intensive rehabilitation regime.: Yes Daily analysis of laboratory values and/or radiology reports with any subsequent need for medication adjustment of medical intervention for : Neurological problems;Other  [name], [name] 10/09/2013, 1:41 PM

## 2013-10-09 NOTE — Progress Notes (Signed)
Occupational Therapy Note  Patient Details  Name: [name] MRN: [id number] Date of Birth: 1930/11/25 Today's Date: 10/09/2013  Time:  0900-1000  (60 min) Pain:  none Individual session  Pt. Sitting in wc upon OT arrival.  Pt. Elected to take sponge bath at sink.  Addressed sit to stand, standing balance, functional mobility, LUE NMRE through functional task.  Pt. Needed minimal assist with wc safety when standing.  Pt. Ambulated with RW to bathroom and stood with minimal balance assist to doff pants.  Pt. Had BM.  Assisted pt with crossing LLE to don sock with max assist. Pt. Used LUE for grooming with active assist.  Pt. Able to wash RUE using LUE.  Left pt in wc with call bell in reach.       Lisa Roca 10/09/2013, 10:08 AM

## 2013-10-09 NOTE — Progress Notes (Addendum)
Physical Therapy Note  Patient Details  Name: [name] MRN: [id number] Date of Birth: August 27, 1931 Today's Date: 10/09/2013  9417-4081 no pain at rest; L ribs painful with bed mobility; premedicated 55 min individual therapy  1405-1430 25 min individual therapy; no c/o pain  Tx 1:  Bed mobility- bridging with mod assist, rolling R with min assist, to don pants in supine with max assist in supine.  L sidelying> sit in flat bed without rail, (inflated overlay) iwht mod assist.  Pt limited by L rib pain during rolling L and sitting up.  Bed> w/c stand pivot transfer to R, min/mod assist.  Sit> stand with mod VCs for technique, min assist.  neuromuscular re-education via tactile and VCS, demo for use of L hand for gross assist with breakfast items, L LE isolated movements in sitting in preparation for gait, L pelvic protraction during gait, upright trunk and forward gaze.  L inattention noted during w/c propulsion using bil LEs to propel, in home setting; pt encountered sink with L upper arm and was unaware, continuing to attempt to propel w/c even after cueing from therapist.  Gait x 180' with min assist , RW.  Tx 2:  tx focused on neuromuscular re-education via forced use, demo, VCs for L stance stability, L foot clearance during swing phase, pelvic dissociation, sit>< stand, gait, self-stretching.  Pt instructed in self-ROM bil LEs hamstrings and heel cords using leg lifter strap and step stool.  Hold x 30 seconds x 3, bil.  Gait x 170' with RW, min/mod assist and cues for L foot clearance, L hip stability, upright trunk and forward gaze.  Taleigha Pinson 10/09/2013, 8:23 AM

## 2013-10-09 NOTE — Progress Notes (Signed)
Social Work Patient ID: [name], male   DOB: 1931/01/14, 80 y.o.   MRN: [id number] Update faxed to [facility] and will await response.  Met with pt and wife to inform team conference goals-supervision level and discharge 1/31. Pt wants to make sure he is not too much care for his wife.  Have asked wife to come in next week to attend therapies with pt so she can see his progress. He is aware he has left sided inattention and needs to be careful of this.  Wife is pleased with how well pt is doing.  Continue to work on discharge plans.

## 2013-10-09 NOTE — Progress Notes (Signed)
Physical Therapy Session Note  Patient Details  Name: CORBY VANDENBERGHE MRN: [id number] Date of Birth: [birth year]  Today's Date: 10/09/2013 Time:  1505-1550 (52min)   Short Term Goals: Week 1:  PT Short Term Goal 1 (Week 1): Pt will propell WC x150' with S in controlled setting PT Short Term Goal 2 (Week 1): Pt will perform bed mobility with mod A  PT Short Term Goal 3 (Week 1): Pt will perform basic transfers with Mod A 4/5 trials PT Short Term Goal 4 (Week 1): Pt will ambulate 54' with RW and min A PT Short Term Goal 5 (Week 1): Pt will attempt stairs  Skilled Therapeutic Interventions/Progress Updates:  Tx focused on NMR via dynamic sitting and standing balance, trunk mobility, and transfer training.  Staff pushed WC as pt already very tired from day of therapy.  Pt engaged in sitting reaching/placing outside BOS from L<>R. Manual facilitation for trunk lengthening, as well as LUE weight bearing and assisted elbow ext to return to sitting.  Also performed standing x39min without UE support and Min A for steadying due to poster leaning, pt able to slowly self correct. Pt given verbal cues and manual facilitation for increased LLE weight-shifting.  Stand-pivot transfers with min/mod A with cues for LLE advancement.  Toilet transfer with min A and cues for safe technique and garb bars, handoff to NT     Therapy Documentation Precautions:  Precautions Precautions: Fall Precaution Comments: L-sided weakness and inattention Restrictions Weight Bearing Restrictions: No    Pain: 10/10 low back pain, nursing made aware  See FIM for current functional status  Therapy/Group: Individual Therapy  Kennieth Rad, PT, DPT   10/09/2013, 3:32 PM

## 2013-10-09 NOTE — Progress Notes (Signed)
80 y.o. right-handed male with known history of large meningioma. Admitted 09/20/2013 with increasing left-sided weakness as well as recent fall. Latest MRI scan showed some increased mass effect. Underwent stereotactic right frontoparietal craniotomy for resection of meningioma 09/27/2013 per Dr. Kathyrn Sheriff. Followup MRI of the brain 09/29/2013 showed complete resection of the main tumor mass although there did appear to be some small amount of residual tumor within the superior sagittal sinus. Maintained on Decadron protocol and tapered to 2 mg every 8 hours 10/01/2013. He was tolerating a regular diet. Keppra added for seizure prophylaxis. Acute blood loss anemia 9.4 maintained on iron supplement  Subjective/Complaints: Low BP this am but no symptoms Review of Systems - Negative except dysuria and freq Objective: Vital Signs: Blood pressure 110/52, pulse 77, temperature 97.8 F (36.6 C), temperature source Oral, resp. rate 18, weight 85.231 kg (187 lb 14.4 oz), SpO2 98.00%. No results found. No results found for this or any previous visit (from the past 72 hour(s)).   HEENT: normal Cardio: RRR and no murmur Resp: CTA B/L and unlabored GI: BS positive and NT,ND Extremity:  Pulses positive and No Edema Skin:   Intact and Other Scalp wound with staples CDI Neuro: Alert/Oriented, Cranial Nerve II-XII normal, Abnormal Motor 2-/5 L delt, 4/5 Left Bi, tri, grip, 3- L HF, KE, ADF and Abnormal FMC Ataxic/ dec FMC Musc/Skel:  Normal Gen NAD   Assessment/Plan: 1. Functional deficits secondary to Right frontal meningioma s/p resection which require 3+ hours per day of interdisciplinary therapy in a comprehensive inpatient rehab setting. Physiatrist is providing close team supervision and 24 hour management of active medical problems listed below. Physiatrist and rehab team continue to assess barriers to discharge/monitor patient progress toward functional and medical goals. FIM: FIM -  Bathing Bathing Steps Patient Completed: Chest;Abdomen;Right upper leg;Left upper leg;Right Arm;Front perineal area;Left Arm;Right lower leg (including foot);Left lower leg (including foot);Buttocks Bathing: 4: Steadying assist  FIM - Upper Body Dressing/Undressing Upper body dressing/undressing steps patient completed: Thread/unthread right sleeve of pullover shirt/dresss;Put head through opening of pull over shirt/dress Upper body dressing/undressing: 3: Mod-Patient completed 50-74% of tasks FIM - Lower Body Dressing/Undressing Lower body dressing/undressing steps patient completed: Thread/unthread right pants leg;Thread/unthread left pants leg;Don/Doff right shoe;Don/Doff left shoe;Pull pants up/down Lower body dressing/undressing: 3: Mod-Patient completed 50-74% of tasks  FIM - Toileting Toileting steps completed by patient: Adjust clothing prior to toileting;Adjust clothing after toileting Toileting Assistive Devices: Grab bar or rail for support Toileting: 3: Mod-Patient completed 2 of 3 steps  FIM - Radio producer Devices: Mining engineer Transfers: 3-To toilet/BSC: Mod A (lift or lower assist);4-From toilet/BSC: Min A (steadying Pt. > 75%)  FIM - Bed/Chair Transfer Bed/Chair Transfer Assistive Devices: Arm rests;Walker Bed/Chair Transfer: 4: Chair or W/C > Bed: Min A (steadying Pt. > 75%);3: Bed > Chair or W/C: Mod A (lift or lower assist)  FIM - Locomotion: Wheelchair Distance: 150 Locomotion: Wheelchair: 4: Travels 150 ft or more: maneuvers on rugs and over door sillls with minimal assistance (Pt.>75%) FIM - Locomotion: Ambulation Locomotion: Ambulation Assistive Devices: Administrator Ambulation/Gait Assistance: 3: Mod assist Locomotion: Ambulation: 2: Travels 50 - 149 ft with moderate assistance (Pt: 50 - 74%)  Comprehension Comprehension Mode: Auditory Comprehension: 5-Follows basic conversation/direction: With no  assist  Expression Expression Mode: Verbal Expression: 6-Expresses complex ideas: With extra time/assistive device  Social Interaction Social Interaction: 5-Interacts appropriately 90% of the time - Needs monitoring or encouragement for participation or interaction.  Problem Solving  Problem Solving: 5-Solves basic 90% of the time/requires cueing < 10% of the time  Memory Memory: 4-Recognizes or recalls 75 - 89% of the time/requires cueing 10 - 24% of the time  Medical Problem List and Plan:  1. right frontoparietal meningioma status post resection 09/27/2013 with left hemiplegia  2. DVT Prophylaxis/Anticoagulation: SCDs. Monitor for any signs of DVT  3. Pain Management: Lidoderm patch, oxycodone as needed. Monitor with increased mobility  4. Neuropsych: This patient is capable of making decisions on his own behalf.  5. Seizure prophylaxis. Keppra 500 mg every 12 hours. Monitor for any seizure activity  6. Acute blood loss anemia. Continue iron. Followup CBC  7. Restless leg syndrome. Requip takes at 5p and 9p at home  8. BPH. Flomax/Enablex as directed. If pt develops orthostatic hypotension may need to hold flomax 9. Hyperlipidemia. Zocor  10.GERD. Protonix  11.  Dysuria- +UA,ecoli, Keflex sensitive 12.  Sacral skin area poor mobility , add mattress overlay LOS (Days) 6 A FACE TO FACE EVALUATION WAS PERFORMED  KIRSTEINS,ANDREW E 10/09/2013, 9:01 AM

## 2013-10-10 ENCOUNTER — Inpatient Hospital Stay (HOSPITAL_COMMUNITY): Payer: Medicare Other

## 2013-10-10 ENCOUNTER — Inpatient Hospital Stay (HOSPITAL_COMMUNITY): Payer: Medicare Other | Admitting: Occupational Therapy

## 2013-10-10 ENCOUNTER — Encounter (HOSPITAL_COMMUNITY): Payer: Medicare Other | Admitting: Occupational Therapy

## 2013-10-10 NOTE — Progress Notes (Signed)
Occupational Therapy Weekly Progress Note And Therapy Treatment Notes  Patient Details  Name: [name] MRN: [id number] Date of Birth: October 31, 1930  Today's Date: 10/10/2013 Time: 4142-3953 and 215-253 Time Calculation (min): 40 min (missed 20 min due to missed his breakfast and wanted to eat) and 38 min  Patient has met 5 of 5 short term goals.  Patient progressing as expected and exceeding with several of his LTGs therefore they have been upgraded to overall supervision and min assist with LB dressing.  Patient continues to demonstrate the following deficits: abnormal posture, left non-dominant hemiparesis, decreased left visual spatial and body awareness, muscle weakness (generalized) and swelling of LLE  and therefore will continue to benefit from skilled OT intervention to enhance overall performance with BADL and Reduce care partner burden.  Patient progressing toward long term goals. See LTG upgrades.    OT Short Term Goals Week 1:   OT Short Term Goal 1 (Week 1): Pt. will bathe UB with min assist OT Short Term Goal 1 - Progress (Week 1): Met OT Short Term Goal 2 (Week 1): Pt. will bath LB with mod assist  OT Short Term Goal 2 - Progress (Week 1): Met OT Short Term Goal 3 (Week 1): Pt will go from sit to stand with mod assist. OT Short Term Goal 3 - Progress (Week 1): Met OT Short Term Goal 4 (Week 1): Pt. will transfer to toilet with mod assist.   OT Short Term Goal 4 - Progress (Week 1): Met OT Short Term Goal 5 (Week 1): Pt. will dress UB with mod assist. OT Short Term Goal 5 - Progress (Week 1): Met Week 2:  OT Short Term Goal 1 (Week 2): STGs=LTGs of overall Supervision to Min assist  Skilled Therapeutic Interventions/Progress Updates:  1)  Patient resting in w/c upon arrival and asking if he could eat breakfast before we begin session secondary to he missed breakfast due to earlier therapy. Engaged in self care retraining to include shower and dressing. Focused session on  activity tolerance, sustained attention, dynamic balance with functional mobility and ambulating with RW to and from bathroom, attention to left and forced use of LUE. Patient performed shower seated to include use of LH sponge to bathe feet and in stand to bathe peri area and buttocks while holding onto the grab bar. Patient occasionally requires [facility] assist when attempting to use LUE. More successful when he watches his left hand at work.   2)  Patient resting in w/c upon arrival.  Reviewed patient's progress since Inpatient Rehab admission and informed him that a few OT LTGs have been upgraded.  Patient was encouraged yet he realizes that he still has work to do and he is still a fall risk.  "I'm trying", "I don't want to fall".  Reviewed concern that patient's left visual spatial and body inattention as well as so easily distracted are areas we need to focus on for safety.  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  Patient practiced doff and donn socks and shoes with elastic laces.  Patient continues to have difficulty with left sock and shoe.  BLE edema noted, RN aware and knee high TEDS put on patient.  Patient transferred to recliner to elevate his BLEs, all items in reach.  Therapy Documentation Precautions:  Precautions Precautions: Fall Precaution Comments: L-sided weakness and inattention Restrictions Weight Bearing Restrictions: No Pain: Denies pain in both sessions ADL: See FIM for current functional status  Therapy/Group:  Individual Therapy both sessions  [place], [place] 10/10/2013, 10:41 AM

## 2013-10-10 NOTE — Progress Notes (Signed)
Physical Therapy Note  Patient Details  Name: [name] MRN: [id number] Date of Birth: [birth year] Today's Date: 10/10/2013 0805-0900 55 min individual therapy Pain, unrated R knee ; received Voltaren gel R knee during therapy  tx focused on neuromuscular re-education via demo, VCs, tactile and manual cues for L trunk and LE activation during rolling, scooting in supine, L sidelying> sit, transfers, gait.  NuSTep at level 2-3 x 8 minutes for increasing flexibility in RLE (knee OA) at beginning of session.  Pt rated ex 12 on Borg scale, and stated his knee felt much better afterward.  In supine with LLE off mat on stool, 1/2 bridging to activate L gluteal muscles, 10 x 2.  Gait x 150' with RW, min guard> min/mod assist for keeping LEs within RW/ wt shifting for full L foot clearance, upright trunk and forward gaze.  Up/down 5 steps 2 rails, with min assist, tactile and VCs to advance L hand, VCs for sequencing.  No knee buckling or LOB except balance disturbed when L hand fell off railing,recovered independently.  Keyra Virella 10/10/2013, 8:46 AM

## 2013-10-10 NOTE — Progress Notes (Addendum)
80 y.o. right-handed male with known history of large meningioma. Admitted 09/20/2013 with increasing left-sided weakness as well as recent fall. Latest MRI scan showed some increased mass effect. Underwent stereotactic right frontoparietal craniotomy for resection of meningioma 09/27/2013 per Dr. Kathyrn Sheriff. Followup MRI of the brain 09/29/2013 showed complete resection of the main tumor mass although there did appear to be some small amount of residual tumor within the superior sagittal sinus. Maintained on Decadron protocol and tapered to 2 mg every 8 hours 10/01/2013. He was tolerating a regular diet. Keppra added for seizure prophylaxis. Acute blood loss anemia 9.4 maintained on iron supplement  Subjective/Complaints: Low BP this am but no symptoms Review of Systems - Negative except dysuria and freq Objective: Vital Signs: Blood pressure 112/58, pulse 69, temperature 98 F (36.7 C), temperature source Oral, resp. rate 18, weight 85.231 kg (187 lb 14.4 oz), SpO2 100.00%. No results found. No results found for this or any previous visit (from the past 72 hour(s)).   HEENT: normal Cardio: RRR and no murmur Resp: CTA B/L and unlabored GI: BS positive and NT,ND Extremity:  Pulses positive and No Edema Skin:   Intact and Other Scalp wound with staples CDI Neuro: Alert/Oriented, Cranial Nerve II-XII normal, Abnormal Motor 2-/5 L delt, 4/5 Left Bi, tri, grip, 3- L HF, KE, ADF and Abnormal FMC Ataxic/ dec FMC Musc/Skel:  Normal Gen NAD   Assessment/Plan: 1. Functional deficits secondary to Right frontal meningioma s/p resection which require 3+ hours per day of interdisciplinary therapy in a comprehensive inpatient rehab setting. Physiatrist is providing close team supervision and 24 hour management of active medical problems listed below. Physiatrist and rehab team continue to assess barriers to discharge/monitor patient progress toward functional and medical goals. Discussed D/C date with  Pt FIM: FIM - Bathing Bathing Steps Patient Completed: Chest;Abdomen;Right upper leg;Left upper leg;Right Arm;Front perineal area;Left Arm;Right lower leg (including foot);Left lower leg (including foot);Buttocks Bathing: 4: Steadying assist  FIM - Upper Body Dressing/Undressing Upper body dressing/undressing steps patient completed: Put head through opening of pull over shirt/dress;Thread/unthread left sleeve of pullover shirt/dress Upper body dressing/undressing: 3: Mod-Patient completed 50-74% of tasks FIM - Lower Body Dressing/Undressing Lower body dressing/undressing steps patient completed: Thread/unthread right pants leg;Thread/unthread left pants leg;Don/Doff right shoe;Don/Doff left shoe;Pull pants up/down;Don/Doff right sock Lower body dressing/undressing: 3: Mod-Patient completed 50-74% of tasks  FIM - Toileting Toileting steps completed by patient: Adjust clothing prior to toileting;Adjust clothing after toileting Toileting Assistive Devices: Grab bar or rail for support Toileting: 3: Mod-Patient completed 2 of 3 steps  FIM - Radio producer Devices: Grab bars;Walker Toilet Transfers: 4-To toilet/BSC: Min A (steadying Pt. > 75%)  FIM - Bed/Chair Transfer Bed/Chair Transfer Assistive Devices: Arm rests;Walker Bed/Chair Transfer: 4: Bed > Chair or W/C: Min A (steadying Pt. > 75%);4: Chair or W/C > Bed: Min A (steadying Pt. > 75%)  FIM - Locomotion: Wheelchair Distance: 150 Locomotion: Wheelchair: 1: Total Assistance/staff pushes wheelchair (Pt<25%) FIM - Locomotion: Ambulation Locomotion: Ambulation Assistive Devices: Administrator Ambulation/Gait Assistance: 3: Mod assist Locomotion: Ambulation: 0: Activity did not occur  Comprehension Comprehension Mode: Auditory Comprehension: 5-Follows basic conversation/direction: With no assist  Expression Expression Mode: Verbal Expression: 6-Expresses complex ideas: With extra time/assistive  device  Social Interaction Social Interaction: 5-Interacts appropriately 90% of the time - Needs monitoring or encouragement for participation or interaction.  Problem Solving Problem Solving: 5-Solves basic 90% of the time/requires cueing < 10% of the time  Memory Memory: 4-Recognizes or  recalls 75 - 89% of the time/requires cueing 10 - 24% of the time  Medical Problem List and Plan:  1. right frontoparietal meningioma status post resection 09/27/2013 with left hemiplegia  2. DVT Prophylaxis/Anticoagulation: SCDs. Monitor for any signs of DVT  3. Pain Management: Lidoderm patch, oxycodone as needed. Monitor with increased mobility  4. Neuropsych: This patient is capable of making decisions on his own behalf.  5. Seizure prophylaxis. Keppra 500 mg every 12 hours. Monitor for any seizure activity  6. Acute blood loss anemia. Continue iron. Followup CBC  7. Restless leg syndrome. Requip takes at 5p and 9p at home  8. BPH. Flomax/Enablex as directed. If pt develops orthostatic hypotension may need to hold flomax 9. Hyperlipidemia. Zocor  10.GERD. Protonix  11.  Dysuria- +UA,ecoli, Keflex sensitive, complete tx and recult if recurrent sx 12.  Sacral skin area poor mobility , add mattress overlay LOS (Days) 7 A FACE TO FACE EVALUATION WAS PERFORMED  [name] 10/10/2013, 7:26 AM

## 2013-10-10 NOTE — Progress Notes (Signed)
Speech Language Pathology Daily Session Note  Patient Details  Name: [name] MRN: [id number] Date of Birth: Oct 26, 1930  Today's Date: 10/10/2013 Time: 1015-1100 Time Calculation (min): 45 min  Short Term Goals: Week 1: SLP Short Term Goal 1 (Week 1): Pt will recall 4 out of 4 speech intelligibility strategies with Mod I SLP Short Term Goal 2 (Week 1): Pt will demonstrate adequate medication management with supervision level cues SLP Short Term Goal 3 (Week 1): Pt will demonstrate complex problem solving during funcitonal task wtih supervision level cues  Skilled Therapeutic Interventions: Skilled treatment focused on cognitive goals. SLP facilitated session with medication management task. Pt recalled therapy activities from two days ago. Pt completed pill box task with Min cues for use of external aid as well as for working memory and complex problem solving while completing task. Pt selected attention to task with supervision level redirection cues. Continue plan of care.   FIM:  Comprehension Comprehension Mode: Auditory Comprehension: 6-Follows complex conversation/direction: With extra time/assistive device Expression Expression Mode: Verbal Expression: 6-Expresses complex ideas: With extra time/assistive device Social Interaction Social Interaction: 5-Interacts appropriately 90% of the time - Needs monitoring or encouragement for participation or interaction. Problem Solving Problem Solving: 5-Solves basic problems: With no assist Memory Memory: 5-Recognizes or recalls 90% of the time/requires cueing < 10% of the time  Pain Pain Assessment Pain Assessment: No/denies pain Pain Score: 2   Therapy/Group: Individual Therapy   Germain Osgood, M.A. CCC-SLP [phone]   Germain Osgood 10/10/2013, 1:24 PM

## 2013-10-11 ENCOUNTER — Inpatient Hospital Stay (HOSPITAL_COMMUNITY): Payer: Medicare Other | Admitting: Occupational Therapy

## 2013-10-11 ENCOUNTER — Inpatient Hospital Stay (HOSPITAL_COMMUNITY): Payer: Medicare Other

## 2013-10-11 ENCOUNTER — Inpatient Hospital Stay (HOSPITAL_COMMUNITY): Payer: Medicare Other | Admitting: *Deleted

## 2013-10-11 DIAGNOSIS — G8110 Spastic hemiplegia affecting unspecified side: Secondary | ICD-10-CM

## 2013-10-11 DIAGNOSIS — D320 Benign neoplasm of cerebral meninges: Secondary | ICD-10-CM

## 2013-10-11 MED ORDER — SENNA 8.6 MG PO TABS
1.0000 | ORAL_TABLET | Freq: Every day | ORAL | Status: DC
  Administered 2013-10-12 – 2013-10-13 (×2): 8.6 mg via ORAL
  Filled 2013-10-11 (×3): qty 1

## 2013-10-11 NOTE — Progress Notes (Signed)
Physical Therapy Weekly Progress Note  Patient Details  Name: [name] MRN: [id number] Date of Birth: October 19, 1930  Today's Date: 10/11/2013 Time:  8:03-9:00 ( )   Patient has met 5 of 5 short term goals.  Pt is making good progress with increased LLE strength and motor control as well as static and dynamic balance, working towards physical independence with all aspects of mobility, however he still requires constant supervision and cues for safe management of LUE and attention to L.   Patient continues to demonstrate the following deficits: safety awareness, memory, L inattention, LLE strength and coordination and therefore will continue to benefit from skilled PT intervention to enhance overall performance with activity tolerance, balance, postural control, ability to compensate for deficits, functional use of  left upper extremity and left lower extremity, attention, awareness, coordination and knowledge of precautions.  Patient progressing toward long term goals..  Continue plan of care.  PT Short Term Goals Week 1:  PT Short Term Goal 1 (Week 1): Pt will propell WC x150' with S in controlled setting PT Short Term Goal 1 - Progress (Week 1): Met PT Short Term Goal 2 (Week 1): Pt will perform bed mobility with mod A  PT Short Term Goal 2 - Progress (Week 1): Met PT Short Term Goal 3 (Week 1): Pt will perform basic transfers with Mod A 4/5 trials PT Short Term Goal 3 - Progress (Week 1): Met PT Short Term Goal 4 (Week 1): Pt will ambulate 39' with RW and min A PT Short Term Goal 4 - Progress (Week 1): Met PT Short Term Goal 5 (Week 1): Pt will attempt stairs PT Short Term Goal 5 - Progress (Week 1): Met Week 2:  PT Short Term Goal 1 (Week 2): STG=LTG due to LOS Supervision overall  Skilled Therapeutic Interventions/Progress Updates:  Tx focused on functional mobility, NMR, and gait training with RW.  Instructed pt in supine>sit on pt's R side of bed with Min A for trunk management.  Attempted R/L but pt will need to switch bedsides at home for increased independence.   Navigating tight spaces and side-stepping with RW in room with Min A and cues for safe L UE management on RW.  Assisted with lower body dressing with cues for efficiency and safety.  Toileting with Min A for transfer and safety cues.   Gait in controlled setting with RW 2x150' and Min A  for steadying as well as manual faciliation for L hip and knee ext in stance. Pt with difficulty maintaining straight path, but no LOB. Pt needing safety cues for focusing on task and slow during turns.   NMR via forced use of LLE during lateral step-lowering-tap with LLE up on 4" step of stairs with bil UE assist x6 with manual facilitation for terminal hip/knee ext.   Seated self-HS stretch 2x30sec each R/L on stool with hanout provided. Cues given for safe/effective technuique.   Stairs x5 with reminder for sequence, LUE and demonstration with min A for steadying. Pt left up in [facility] with all needs in reach.    Therapy Documentation Precautions:  Precautions Precautions: Fall Precaution Comments: L-sided weakness and inattention Restrictions Weight Bearing Restrictions: No General:    Therapy Documentation Precautions:  Precautions Precautions: Fall Precaution Comments: L-sided weakness and inattention Restrictions Weight Bearing Restrictions: No   Vital Signs: Therapy Vitals Temp: 98.2 F (36.8 C) Pulse Rate: 80 Resp: 16 BP: 117/51 mmHg Oxygen Therapy SpO2: 98 % O2 Device: None (Room air) Pain: Pain  Assessment Pain Assessment: 0-10 Pain Score: 0-No pain Pain Type: Acute pain Pain Location: Rib cage (back and mid chest pain) Pain Orientation: Left Pain Descriptors / Indicators: Aching Pain Onset: Gradual Pain Intervention(s): Medication (See eMAR) Mobility:   Locomotion : Ambulation Ambulation/Gait Assistance: 4: Min assist   See FIM for current functional status  Therapy/Group:  Individual Therapy  Kennieth Rad, PT, DPT  10/11/2013, 9:03 AM

## 2013-10-11 NOTE — Progress Notes (Signed)
Occupational Therapy Session Note  Patient Details  Name: [name] MRN: [id number] Date of Birth: 1931-08-14  Today's Date: 10/11/2013 Time: 8921-1941 Time Calculation (min): 70 min  Short Term Goals: Week 2:  OT Short Term Goal 1 (Week 2): STGs=LTGs of overall Supervision to Min assist  Skilled Therapeutic Interventions/Progress Updates:  Patient resting in w/c upon arrival with wife visiting.  Original OT scheduled to begin at 9:00 was moved today to allow patient to eat breakfast.  Engaged in self care retraining to include toileting, shower, groom and dress.  Focused session on caregiver education (observe and discussion today-no hands on), attending to the left and to the LUE & LLE, sit><stand, dynamic balance in standing and with HHA to ambulate to and from bathroom, issued and practiced use of blue bath mitt on LUE for forced use and to improve success with using his LUE to wash everything he can reach before he begins to use his RUE to finish any portion of bath that was missed.  Patient sits for all of bath except standing with grab bar to bathe peri area and buttocks.  Patient uses LG sponge to improve independence with LB bath.  Patient has been wearing adult briefs/diaper since admission to inpatient rehab.  Today, he has decided to try to use underware and RN is aware that patient will need to sit on toilet for both urinate and BM.  Patient continues to require occasional cues regarding position, placement and functional use of LUE.  Discussed with patient and RN that bed overlay is no longer necessary regarding skin issues.    Therapy Documentation Precautions:  Precautions Precautions: Fall Precaution Comments: L-sided weakness and inattention Restrictions Weight Bearing Restrictions: No Pain: Pain Assessment Pain Score: 4  Pain Type: Acute pain Pain Location: Rib cage Pain Orientation: Left Pain Descriptors / Indicators: Aching Pain Intervention(s): Medication (See  eMAR) ADL: See FIM for current functional status  Therapy/Group: Individual Therapy  [place] Giammona, Amasa 10/11/2013, 12:20 PM

## 2013-10-11 NOTE — Progress Notes (Signed)
80 y.o. right-handed male with known history of large meningioma. Admitted 09/20/2013 with increasing left-sided weakness as well as recent fall. Latest MRI scan showed some increased mass effect. Underwent stereotactic right frontoparietal craniotomy for resection of meningioma 09/27/2013 per Dr. Kathyrn Sheriff. Followup MRI of the brain 09/29/2013 showed complete resection of the main tumor mass although there did appear to be some small amount of residual tumor within the superior sagittal sinus. Maintained on Decadron protocol and tapered to 2 mg every 8 hours 10/01/2013. He was tolerating a regular diet. Keppra added for seizure prophylaxis. Acute blood loss anemia 9.4 maintained on iron supplement  Subjective/Complaints: Frequent stooling Chest and back pain after therapy yest but none currently No SOB, no L arm pain Review of Systems - Negative except dysuria and freq Objective: Vital Signs: Blood pressure 117/51, pulse 80, temperature 98.2 F (36.8 C), temperature source Oral, resp. rate 16, weight 85.231 kg (187 lb 14.4 oz), SpO2 98.00%. No results found. No results found for this or any previous visit (from the past 72 hour(s)).   HEENT: normal Cardio: RRR and no murmur Resp: CTA B/L and unlabored GI: BS positive and NT,ND Extremity:  Pulses positive and No Edema Skin:   Intact and Other Scalp wound with staples CDI Neuro: Alert/Oriented, Cranial Nerve II-XII normal, Abnormal Motor 2-/5 L delt, 4/5 Left Bi, tri, grip, 3- L HF, KE, ADF and Abnormal FMC Ataxic/ dec FMC Musc/Skel:  Normal, no tenderness over chest, no pain with l shoulder ROM, Pain over L lower ribs (not new) Gen NAD   Assessment/Plan: 1. Functional deficits secondary to Right frontal meningioma s/p resection which require 3+ hours per day of interdisciplinary therapy in a comprehensive inpatient rehab setting. Physiatrist is providing close team supervision and 24 hour management of active medical problems listed  below. Physiatrist and rehab team continue to assess barriers to discharge/monitor patient progress toward functional and medical goals.  FIM: FIM - Bathing Bathing Steps Patient Completed: Chest;Abdomen;Right upper leg;Left upper leg;Right Arm;Front perineal area;Left Arm;Right lower leg (including foot);Left lower leg (including foot);Buttocks Bathing: 4: Steadying assist  FIM - Upper Body Dressing/Undressing Upper body dressing/undressing steps patient completed: Put head through opening of pull over shirt/dress;Thread/unthread left sleeve of pullover shirt/dress Upper body dressing/undressing: 3: Mod-Patient completed 50-74% of tasks FIM - Lower Body Dressing/Undressing Lower body dressing/undressing steps patient completed: Thread/unthread right pants leg;Thread/unthread left pants leg;Don/Doff right shoe;Don/Doff left shoe;Pull pants up/down;Don/Doff right sock Lower body dressing/undressing: 3: Mod-Patient completed 50-74% of tasks  FIM - Toileting Toileting steps completed by patient: Adjust clothing prior to toileting;Adjust clothing after toileting Toileting Assistive Devices: Grab bar or rail for support Toileting: 3: Mod-Patient completed 2 of 3 steps  FIM - Radio producer Devices: Grab bars;Walker Toilet Transfers: 4-To toilet/BSC: Min A (steadying Pt. > 75%)  FIM - Bed/Chair Transfer Bed/Chair Transfer Assistive Devices: Arm rests;Walker Bed/Chair Transfer: 4: Chair or W/C > Bed: Min A (steadying Pt. > 75%);4: Bed > Chair or W/C: Min A (steadying Pt. > 75%)  FIM - Locomotion: Wheelchair Distance: 150 Locomotion: Wheelchair: 1: Total Assistance/staff pushes wheelchair (Pt<25%) FIM - Locomotion: Ambulation Locomotion: Ambulation Assistive Devices: Administrator Ambulation/Gait Assistance: 4: Min assist Locomotion: Ambulation: 4: Travels 150 ft or more with minimal assistance (Pt.>75%)  Comprehension Comprehension Mode:  Auditory Comprehension: 6-Follows complex conversation/direction: With extra time/assistive device  Expression Expression Mode: Verbal Expression: 6-Expresses complex ideas: With extra time/assistive device  Social Interaction Social Interaction: 5-Interacts appropriately 90% of the time -  Needs monitoring or encouragement for participation or interaction.  Problem Solving Problem Solving: 5-Solves basic problems: With no assist  Memory Memory: 5-Recognizes or recalls 90% of the time/requires cueing < 10% of the time  Medical Problem List and Plan:  1. right frontoparietal meningioma status post resection 09/27/2013 with left hemiplegia  2. DVT Prophylaxis/Anticoagulation: SCDs. Monitor for any signs of DVT  3. Pain Management: Lidoderm patch to Left ribs, oxycodone as needed. Monitor with increased mobility  4. Neuropsych: This patient is capable of making decisions on his own behalf.  5. Seizure prophylaxis. Keppra 500 mg every 12 hours. Monitor for any seizure activity  6. Acute blood loss anemia. Continue iron. Followup CBC  7. Restless leg syndrome. Requip takes at 5p and 9p at home  8. BPH. Flomax/Enablex as directed. If pt develops orthostatic hypotension may need to hold flomax 9. Hyperlipidemia. Zocor  10.GERD. Protonix  11.  Dysuria- +UA,ecoli, Keflex sensitive, complete tx and recult if recurrent sx 12.  Sacral skin area poor mobility , add mattress overlay 13.  Constipation improved reduce senna LOS (Days) 8 A FACE TO FACE EVALUATION WAS PERFORMED  KIRSTEINS,ANDREW E 10/11/2013, 7:33 AM

## 2013-10-11 NOTE — Progress Notes (Signed)
Speech Language Pathology Weekly Progress & Daily Session Notes  Patient Details  Name: [name] MRN: [id number] Date of Birth: Jan 22, 1931  Today's Date: 10/11/2013 Time: 3300-7622 Time Calculation (min): 45 min  Short Term Goals: Week 1: SLP Short Term Goal 1 (Week 1): Pt will recall 4 out of 4 speech intelligibility strategies with Mod I SLP Short Term Goal 1 - Progress (Week 1): Not met SLP Short Term Goal 2 (Week 1): Pt will demonstrate adequate medication management with supervision level cues SLP Short Term Goal 2 - Progress (Week 1): Met SLP Short Term Goal 3 (Week 1): Pt will demonstrate complex problem solving during funcitonal task wtih supervision level cues SLP Short Term Goal 3 - Progress (Week 1): Met  New Short Term Goals: Week 2: SLP Short Term Goal 1 (Week 2): Pt will recall 4 out of 4 speech intelligibility strategies with Mod I SLP Short Term Goal 2 (Week 2): Pt will demonstrate adequate medication management with Mod I SLP Short Term Goal 3 (Week 2): Pt will demonstrate complex problem solving during funcitonal task wtih Mod I  Weekly Progress Updates: Pt has met 2 out of 3 STGs during this reporting period, with increased use of external memory aids and improving complex problem solving. Pt is functioning at a supervision level of assistance with complex cognitive tasks at this time, and education is ongoing. Pt will benefit from continued SLP services to maximize functional independence prior to discharge home.   SLP Intensity: Minumum of 1-2 x/day, 30 to 90 minutes SLP Frequency: 3 out of 7 days SLP Duration/Estimated Length of Stay: 16-18 days SLP Treatment/Interventions: Cognitive remediation/compensation;Cueing hierarchy;Environmental controls;Functional tasks;Internal/external aids;Medication managment;Patient/family education;Speech/Language facilitation  Daily Session Skilled Therapeutic Intervention: Treatment focused on cognitive goals. SLP facilitated  session with medication management task. Pt utilized external memory aid to fill box, with supervision level cueing for complex problem solving and recall. Pt completed task in a more timely manner than during previous session, selecting his attention to the task for 30 minutes without redirection cues. Pt completed route-finding task to get to/from therapy with supervision level cues. Continue plan of care.  FIM:  Comprehension Comprehension Mode: Auditory Comprehension: 5-Understands complex 90% of the time/Cues < 10% of the time Expression Expression Mode: Verbal Expression: 6-Expresses complex ideas: With extra time/assistive device Social Interaction Social Interaction: 5-Interacts appropriately 90% of the time - Needs monitoring or encouragement for participation or interaction. Problem Solving Problem Solving: 5-Solves complex 90% of the time/cues < 10% of the time Memory Memory: 5-Recognizes or recalls 90% of the time/requires cueing < 10% of the time General    Pain Pain Assessment Pain Score: 4   Therapy/Group: Individual Therapy   Germain Osgood, M.A. CCC-SLP [phone]   Germain Osgood 10/11/2013, 1:15 PM

## 2013-10-11 NOTE — Progress Notes (Signed)
Occupational Therapy Session Note  Patient Details  Name: CHAITANYA AMEDEE MRN: [id number] Date of Birth: 21-Mar-1931  Today's Date: 10/11/2013 Time: 1300-1330 Time Calculation (min): 30 min   Skilled Therapeutic Interventions/Progress Updates:    Neuro muscular reeducation: focus on PNF patterns in sitting and supine, core strengthening while incorporating left UE, functional use of left UE with sit <> stands promoting heavy weight bearing. Gave min cuing for visual attention to left UE for more success.   Therapy Documentation Precautions:  Precautions Precautions: Fall Precaution Comments: L-sided weakness and inattention Restrictions Weight Bearing Restrictions: No Pain:  no c/o pain   See FIM for current functional status  Therapy/Group: Individual Therapy  Willeen Cass [facility] 10/11/2013, 4:16 PM

## 2013-10-12 ENCOUNTER — Inpatient Hospital Stay (HOSPITAL_COMMUNITY): Payer: Medicare Other | Admitting: Physical Therapy

## 2013-10-12 DIAGNOSIS — G8110 Spastic hemiplegia affecting unspecified side: Secondary | ICD-10-CM

## 2013-10-12 DIAGNOSIS — D320 Benign neoplasm of cerebral meninges: Secondary | ICD-10-CM

## 2013-10-12 NOTE — Progress Notes (Signed)
Physical Therapy Note  Patient Details  Name: [name] MRN: [id number] Date of Birth: 31-Oct-1930 Today's Date: 10/12/2013  1400 - 1455 (67 minutes) double Pain: no complaint of pain Focus of treatment: therapeutic exercise focused on activity tolerance/ strengthening left LE; gait training Treatment: Nustep Level 4 X 10 minutes for activity tolerance; gait 80 feet X 2 RW min to close SBA; up/down 4 inch step on left LE for quad and hip abductor strengthening    Tiffanny Lamarche,JIM 10/12/2013, 2:32 PM

## 2013-10-12 NOTE — Progress Notes (Signed)
80 y.o. right-handed male with known history of large meningioma. Admitted 09/20/2013 with increasing left-sided weakness as well as recent fall. Latest MRI scan showed some increased mass effect. Underwent stereotactic right frontoparietal craniotomy for resection of meningioma 09/27/2013 per Dr. Kathyrn Sheriff. Followup MRI of the brain 09/29/2013 showed complete resection of the main tumor mass although there did appear to be some small amount of residual tumor within the superior sagittal sinus. Maintained on Decadron protocol and tapered to 2 mg every 8 hours 10/01/2013. He was tolerating a regular diet. Keppra added for seizure prophylaxis. Acute blood loss anemia 9.4 maintained on iron supplement  Subjective/Complaints: Pain better. Happy with progress in strength. Slept well.  Review of Systems - otherwise negative Objective: Vital Signs: Blood pressure 145/67, pulse 84, temperature 97.7 F (36.5 C), temperature source Oral, resp. rate 18, weight 85.231 kg (187 lb 14.4 oz), SpO2 99.00%. No results found. No results found for this or any previous visit (from the past 72 hour(s)).   HEENT: normal Cardio: RRR and no murmur Resp: CTA B/L and unlabored GI: BS positive and NT,ND Extremity:  Pulses positive and No Edema Skin:   Intact and Other Scalp wound with staples CDI Neuro: Alert/Oriented, Cranial Nerve II-XII normal, Abnormal Motor 3-/5 L delt, 4/5 Left Bi, tri, grip, 3 L HF, KE, ADF and Abnormal FMC Ataxic/ dec FMC Musc/Skel:  Normal, no tenderness over chest, no pain with l shoulder ROM, Pain over L lower ribs (not new) Gen NAD   Assessment/Plan: 1. Functional deficits secondary to Right frontal meningioma s/p resection which require 3+ hours per day of interdisciplinary therapy in a comprehensive inpatient rehab setting. Physiatrist is providing close team supervision and 24 hour management of active medical problems listed below. Physiatrist and rehab team continue to assess barriers  to discharge/monitor patient progress toward functional and medical goals.  FIM: FIM - Bathing Bathing Steps Patient Completed: Chest;Abdomen;Right upper leg;Left upper leg;Right Arm;Front perineal area;Left Arm;Right lower leg (including foot);Left lower leg (including foot);Buttocks Bathing: 4: Steadying assist  FIM - Upper Body Dressing/Undressing Upper body dressing/undressing steps patient completed: Put head through opening of pull over shirt/dress;Thread/unthread left sleeve of pullover shirt/dress Upper body dressing/undressing: 3: Mod-Patient completed 50-74% of tasks FIM - Lower Body Dressing/Undressing Lower body dressing/undressing steps patient completed: Thread/unthread right pants leg;Thread/unthread left pants leg;Don/Doff right shoe;Don/Doff left shoe;Pull pants up/down;Don/Doff right sock Lower body dressing/undressing: 3: Mod-Patient completed 50-74% of tasks  FIM - Toileting Toileting steps completed by patient: Adjust clothing prior to toileting;Performs perineal hygiene;Adjust clothing after toileting Toileting Assistive Devices: Grab bar or rail for support Toileting: 4: Steadying assist  FIM - Radio producer Devices: Grab bars;Walker Toilet Transfers: 4-To toilet/BSC: Min A (steadying Pt. > 75%);4-From toilet/BSC: Min A (steadying Pt. > 75%)  FIM - Bed/Chair Transfer Bed/Chair Transfer Assistive Devices: Arm rests;Walker Bed/Chair Transfer: 4: Supine > Sit: Min A (steadying Pt. > 75%/lift 1 leg);4: Bed > Chair or W/C: Min A (steadying Pt. > 75%);4: Chair or W/C > Bed: Min A (steadying Pt. > 75%)  FIM - Locomotion: Wheelchair Distance: 150 Locomotion: Wheelchair: 0: Activity did not occur FIM - Locomotion: Ambulation Locomotion: Ambulation Assistive Devices: Administrator Ambulation/Gait Assistance: 4: Min assist Locomotion: Ambulation: 4: Travels 150 ft or more with minimal assistance (Pt.>75%)  Comprehension Comprehension  Mode: Auditory Comprehension: 5-Understands basic 90% of the time/requires cueing < 10% of the time  Expression Expression Mode: Verbal Expression: 6-Expresses complex ideas: With extra time/assistive device  Social Interaction  Social Interaction: 5-Interacts appropriately 90% of the time - Needs monitoring or encouragement for participation or interaction.  Problem Solving Problem Solving: 5-Solves complex 90% of the time/cues < 10% of the time  Memory Memory: 5-Recognizes or recalls 90% of the time/requires cueing < 10% of the time  Medical Problem List and Plan:  1. right frontoparietal meningioma status post resection 09/27/2013 with left hemiplegia  2. DVT Prophylaxis/Anticoagulation: SCDs. Monitor for any signs of DVT  3. Pain Management: Lidoderm patch to Left ribs, oxycodone as needed. Monitor with increased mobility  4. Neuropsych: This patient is capable of making decisions on his own behalf.  5. Seizure prophylaxis. Keppra 500 mg every 12 hours. Monitor for any seizure activity  6. Acute blood loss anemia. Continue iron.   7. Restless leg syndrome. Requip takes at 5p and 9p at home  8. BPH. Flomax/Enablex as directed. If pt develops orthostatic hypotension may need to hold flomax 9. Hyperlipidemia. Zocor  10.GERD. Protonix  11.  Dysuria- +UA,ecoli, Keflex sensitive, complete tx today.  recult if recurrent sx 12.  Sacral skin area poor mobility , add mattress overlay, turning, oob 13.  Constipation improved reduced senna LOS (Days) 9 A FACE TO FACE EVALUATION WAS PERFORMED  Tekeyah Santiago T 10/12/2013, 9:20 AM

## 2013-10-13 ENCOUNTER — Inpatient Hospital Stay (HOSPITAL_COMMUNITY): Payer: Medicare Other | Admitting: Physical Therapy

## 2013-10-13 NOTE — Progress Notes (Signed)
80 y.o. right-handed male with known history of large meningioma. Admitted 09/20/2013 with increasing left-sided weakness as well as recent fall. Latest MRI scan showed some increased mass effect. Underwent stereotactic right frontoparietal craniotomy for resection of meningioma 09/27/2013 per Dr. Kathyrn Sheriff. Followup MRI of the brain 09/29/2013 showed complete resection of the main tumor mass although there did appear to be some small amount of residual tumor within the superior sagittal sinus. Maintained on Decadron protocol and tapered to 2 mg every 8 hours 10/01/2013. He was tolerating a regular diet. Keppra added for seizure prophylaxis. Acute blood loss anemia 9.4 maintained on iron supplement  Subjective/Complaints: No new complaints. Pain controlled. No cough/sob, cp/n/v/d.  Review of Systems - otherwise negative Objective: Vital Signs: Blood pressure 109/63, pulse 76, temperature 97.8 F (36.6 C), temperature source Oral, resp. rate 16, weight 85.231 kg (187 lb 14.4 oz), SpO2 97.00%. No results found. No results found for this or any previous visit (from the past 72 hour(s)).   HEENT: normal Cardio: RRR and no murmur Resp: CTA B/L and unlabored GI: BS positive and NT,ND Extremity:  Pulses positive and No Edema Skin:   Intact and Other Scalp wound with staples CDI Neuro: Alert/Oriented, Cranial Nerve II-XII normal, Abnormal Motor 3-/5 L delt, 4/5 Left Bi, tri, grip, 3 L HF, KE, ADF and Abnormal FMC Ataxic/ dec FMC Musc/Skel:  Normal, no tenderness over chest, no pain with l shoulder ROM, Pain over L lower ribs (not new) Gen NAD   Assessment/Plan: 1. Functional deficits secondary to Right frontal meningioma s/p resection which require 3+ hours per day of interdisciplinary therapy in a comprehensive inpatient rehab setting. Physiatrist is providing close team supervision and 24 hour management of active medical problems listed below. Physiatrist and rehab team continue to assess  barriers to discharge/monitor patient progress toward functional and medical goals.  FIM: FIM - Bathing Bathing Steps Patient Completed: Chest;Abdomen;Right upper leg;Left upper leg;Right Arm;Front perineal area;Left Arm;Right lower leg (including foot);Left lower leg (including foot);Buttocks Bathing: 4: Steadying assist  FIM - Upper Body Dressing/Undressing Upper body dressing/undressing steps patient completed: Put head through opening of pull over shirt/dress;Thread/unthread left sleeve of pullover shirt/dress Upper body dressing/undressing: 3: Mod-Patient completed 50-74% of tasks FIM - Lower Body Dressing/Undressing Lower body dressing/undressing steps patient completed: Thread/unthread right pants leg;Thread/unthread left pants leg;Don/Doff right shoe;Don/Doff left shoe;Pull pants up/down;Don/Doff right sock Lower body dressing/undressing: 3: Mod-Patient completed 50-74% of tasks  FIM - Toileting Toileting steps completed by patient: Adjust clothing prior to toileting;Performs perineal hygiene;Adjust clothing after toileting Toileting Assistive Devices: Grab bar or rail for support Toileting: 4: Steadying assist  FIM - Radio producer Devices: Grab bars;Walker Toilet Transfers: 4-To toilet/BSC: Min A (steadying Pt. > 75%);4-From toilet/BSC: Min A (steadying Pt. > 75%)  FIM - Bed/Chair Transfer Bed/Chair Transfer Assistive Devices: Arm rests;Walker Bed/Chair Transfer: 4: Supine > Sit: Min A (steadying Pt. > 75%/lift 1 leg);4: Bed > Chair or W/C: Min A (steadying Pt. > 75%);4: Chair or W/C > Bed: Min A (steadying Pt. > 75%)  FIM - Locomotion: Wheelchair Distance: 150 Locomotion: Wheelchair: 0: Activity did not occur FIM - Locomotion: Ambulation Locomotion: Ambulation Assistive Devices: Administrator Ambulation/Gait Assistance: 4: Min assist Locomotion: Ambulation: 4: Travels 150 ft or more with minimal assistance  (Pt.>75%)  Comprehension Comprehension Mode: Auditory Comprehension: 5-Understands basic 90% of the time/requires cueing < 10% of the time  Expression Expression Mode: Verbal Expression: 6-Expresses complex ideas: With extra time/assistive device  Social Interaction Social  Interaction: 5-Interacts appropriately 90% of the time - Needs monitoring or encouragement for participation or interaction.  Problem Solving Problem Solving: 5-Solves complex 90% of the time/cues < 10% of the time  Memory Memory: 5-Recognizes or recalls 90% of the time/requires cueing < 10% of the time  Medical Problem List and Plan:  1. right frontoparietal meningioma status post resection 09/27/2013 with left hemiplegia   -remove staples today 2. DVT Prophylaxis/Anticoagulation: SCDs. Monitor for any signs of DVT  3. Pain Management: Lidoderm patch to Left ribs, oxycodone as needed. Monitor with increased mobility  4. Neuropsych: This patient is capable of making decisions on his own behalf.  5. Seizure prophylaxis. Keppra 500 mg every 12 hours. Monitor for any seizure activity  6. Acute blood loss anemia. Continue iron.   7. Restless leg syndrome. Requip takes at 5p and 9p at home  8. BPH. Flomax/Enablex as directed. If pt develops orthostatic hypotension may need to hold flomax 9. Hyperlipidemia. Zocor  10.GERD. Protonix  11.  Dysuria- +UA,ecoli, Keflex sensitive, completed tx yesterday.  reculture if recurrent sx 12.  Sacral skin area poor mobility , add mattress overlay, turning, oob 13.  Constipation improved reduced senna LOS (Days) 10 A FACE TO FACE EVALUATION WAS PERFORMED  Cesar Alf T 10/13/2013, 8:33 AM

## 2013-10-13 NOTE — Progress Notes (Signed)
Physical Therapy Session Note  Patient Details  Name: [name] MRN: [id number] Date of Birth: August 26, 1931  Today's Date: 10/13/2013 Time: 3953-2023 Time Calculation (min): 42 min  Short Term Goals: Week 1:  PT Short Term Goal 1 (Week 1): Pt will propell WC x150' with S in controlled setting PT Short Term Goal 1 - Progress (Week 1): Met PT Short Term Goal 2 (Week 1): Pt will perform bed mobility with mod A  PT Short Term Goal 2 - Progress (Week 1): Met PT Short Term Goal 3 (Week 1): Pt will perform basic transfers with Mod A 4/5 trials PT Short Term Goal 3 - Progress (Week 1): Met PT Short Term Goal 4 (Week 1): Pt will ambulate 23' with RW and min A PT Short Term Goal 4 - Progress (Week 1): Met PT Short Term Goal 5 (Week 1): Pt will attempt stairs PT Short Term Goal 5 - Progress (Week 1): Met  Skilled Therapeutic Interventions/Progress Updates:  Pt was seen bedside in the am. Pt propelled w/c to and from gym for generalized conditioning and strengthening. In gym performed con taps and mini squats, 3 sets x 10 reps each for LE strengthening and balance. Pt ambulated with rolling walker, 85 feet x 2 with S to min guard and occasional verbal cues.    Therapy Documentation Precautions:  Precautions Precautions: Fall Precaution Comments: L-sided weakness and inattention Restrictions Weight Bearing Restrictions: No General:   Pain: No c/o pain.    Locomotion : Ambulation Ambulation/Gait Assistance: 4: Min guard;5: Supervision   See FIM for current functional status  Therapy/Group: Individual Therapy  Dub Amis 10/13/2013, 12:35 PM

## 2013-10-14 ENCOUNTER — Inpatient Hospital Stay (HOSPITAL_COMMUNITY): Payer: Medicare Other | Admitting: Occupational Therapy

## 2013-10-14 ENCOUNTER — Inpatient Hospital Stay (HOSPITAL_COMMUNITY): Payer: Medicare Other

## 2013-10-14 DIAGNOSIS — D320 Benign neoplasm of cerebral meninges: Secondary | ICD-10-CM

## 2013-10-14 DIAGNOSIS — G8110 Spastic hemiplegia affecting unspecified side: Secondary | ICD-10-CM

## 2013-10-14 MED ORDER — SENNA 8.6 MG PO TABS
1.0000 | ORAL_TABLET | Freq: Two times a day (BID) | ORAL | Status: DC
  Administered 2013-10-14 – 2013-10-19 (×11): 8.6 mg via ORAL
  Filled 2013-10-14 (×11): qty 1

## 2013-10-14 NOTE — Progress Notes (Signed)
80 y.o. right-handed male with known history of large meningioma. Admitted 09/20/2013 with increasing left-sided weakness as well as recent fall. Latest MRI scan showed some increased mass effect. Underwent stereotactic right frontoparietal craniotomy for resection of meningioma 09/27/2013 per Dr. Kathyrn Sheriff. Followup MRI of the brain 09/29/2013 showed complete resection of the main tumor mass although there did appear to be some small amount of residual tumor within the superior sagittal sinus. Maintained on Decadron protocol and tapered to 2 mg every 8 hours 10/01/2013. He was tolerating a regular diet. Keppra added for seizure prophylaxis. Acute blood loss anemia 9.4 maintained on iron supplement  Subjective/Complaints: No new complaints. . No cough/sob, Left rib pain, constipation Review of Systems - otherwise negative Objective: Vital Signs: Blood pressure 144/60, pulse 79, temperature 97.7 F (36.5 C), temperature source Oral, resp. rate 18, weight 85.231 kg (187 lb 14.4 oz), SpO2 96.00%. No results found. No results found for this or any previous visit (from the past 72 hour(s)).   HEENT: normal Cardio: RRR and no murmur Resp: CTA B/L and unlabored GI: BS positive and NT,ND Extremity:  Pulses positive and No Edema Skin:   Intact and Other Scalp wound with staples CDI Neuro: Alert/Oriented, Cranial Nerve II-XII normal, Abnormal Motor 3-/5 L delt, 4/5 Left Bi, tri, grip, 3 L HF, KE, ADF and Abnormal FMC Ataxic/ dec FMC Musc/Skel:  Normal, no tenderness over chest, no pain with l shoulder ROM, Pain over L lower ribs (not new) Gen NAD   Assessment/Plan: 1. Functional deficits secondary to Right frontal meningioma s/p resection which require 3+ hours per day of interdisciplinary therapy in a comprehensive inpatient rehab setting. Physiatrist is providing close team supervision and 24 hour management of active medical problems listed below. Physiatrist and rehab team continue to assess  barriers to discharge/monitor patient progress toward functional and medical goals.  FIM: FIM - Bathing Bathing Steps Patient Completed: Chest;Abdomen;Right upper leg;Left upper leg;Left Arm;Right Arm Bathing: 3: Mod-Patient completes 5-7 61f 10 parts or 50-74%  FIM - Upper Body Dressing/Undressing Upper body dressing/undressing steps patient completed: Thread/unthread right sleeve of front closure shirt/dress;Pull shirt over trunk Upper body dressing/undressing: 3: Mod-Patient completed 50-74% of tasks FIM - Lower Body Dressing/Undressing Lower body dressing/undressing steps patient completed: Don/Doff right shoe Lower body dressing/undressing: 1: Total-Patient completed less than 25% of tasks  FIM - Toileting Toileting steps completed by patient: Adjust clothing prior to toileting;Performs perineal hygiene;Adjust clothing after toileting Toileting Assistive Devices: Grab bar or rail for support Toileting: 4: Steadying assist  FIM - Radio producer Devices: Grab bars;Walker Toilet Transfers: 4-To toilet/BSC: Min A (steadying Pt. > 75%);4-From toilet/BSC: Min A (steadying Pt. > 75%)  FIM - Bed/Chair Transfer Bed/Chair Transfer Assistive Devices: Arm rests Bed/Chair Transfer: 4: Bed > Chair or W/C: Min A (steadying Pt. > 75%)  FIM - Locomotion: Wheelchair Distance: 150 Locomotion: Wheelchair: 5: Travels 150 ft or more: maneuvers on rugs and over door sills with supervision, cueing or coaxing FIM - Locomotion: Ambulation Locomotion: Ambulation Assistive Devices: Administrator Ambulation/Gait Assistance: 4: Min guard;5: Supervision Locomotion: Ambulation: 2: Travels 50 - 149 ft with minimal assistance (Pt.>75%)  Comprehension Comprehension Mode: Auditory Comprehension: 5-Understands basic 90% of the time/requires cueing < 10% of the time  Expression Expression Mode: Verbal Expression: 6-Expresses complex ideas: With extra time/assistive  device  Social Interaction Social Interaction: 5-Interacts appropriately 90% of the time - Needs monitoring or encouragement for participation or interaction.  Problem Solving Problem Solving: 5-Solves basic 90%  of the time/requires cueing < 10% of the time  Memory Memory: 5-Recognizes or recalls 90% of the time/requires cueing < 10% of the time  Medical Problem List and Plan:  1. right frontoparietal meningioma status post resection 09/27/2013 with left hemiplegia   -remove staples today 2. DVT Prophylaxis/Anticoagulation: SCDs. Monitor for any signs of DVT  3. Pain Management: Lidoderm patch to Left ribs, oxycodone as needed. Monitor with increased mobility  4. Neuropsych: This patient is capable of making decisions on his own behalf.  5. Seizure prophylaxis. Keppra 500 mg every 12 hours. Monitor for any seizure activity  6. Acute blood loss anemia. Continue iron.   7. Restless leg syndrome. Requip takes at 5p and 9p at home  8. BPH. Flomax/Enablex as directed. If pt develops orthostatic hypotension may need to hold flomax 9. Hyperlipidemia. Zocor  10.GERD. Protonix  11.  Dysuria- resolved after abx 12.  Sacral skin area poor mobility , add mattress overlay, turning, oob 13.  Constipation  Recurrent increase senna LOS (Days) 11 A FACE TO FACE EVALUATION WAS PERFORMED  KIRSTEINS,ANDREW E 10/14/2013, 7:35 AM

## 2013-10-14 NOTE — Progress Notes (Signed)
Speech Language Pathology Daily Session Note  Patient Details  Name: [name] MRN: [id number] Date of Birth: 1930-11-17  Today's Date: 10/14/2013 Time: 2774-1287 Time Calculation (min): 45 min  Short Term Goals: Week 2: SLP Short Term Goal 1 (Week 2): Pt will recall 4 out of 4 speech intelligibility strategies with Mod I SLP Short Term Goal 2 (Week 2): Pt will demonstrate adequate medication management with Mod I SLP Short Term Goal 3 (Week 2): Pt will demonstrate complex problem solving during funcitonal task wtih Mod I  Skilled Therapeutic Interventions: Skilled treatment focused on cognitive goals. SLP facilitated session with medication management task. Pt completed pill box task with supervision level question cues for adequate use of external aid and complex problem solving while completing task. Pt exhibited divided attention by carrying on a conversation with family and friends in a noisy environment while completing task. Continue plan of care.   FIM:  Comprehension Comprehension Mode: Auditory Comprehension: 5-Understands complex 90% of the time/Cues < 10% of the time Expression Expression Mode: Verbal Expression: 6-Expresses complex ideas: With extra time/assistive device Social Interaction Social Interaction: 5-Interacts appropriately 90% of the time - Needs monitoring or encouragement for participation or interaction. Problem Solving Problem Solving: 5-Solves complex 90% of the time/cues < 10% of the time Memory Memory: 5-Recognizes or recalls 90% of the time/requires cueing < 10% of the time FIM - Eating Eating Activity: 5: Supervision/cues;5: Set-up assist for open containers;5: Set-up assist for cut food  Pain  None/denies pain  Therapy/Group: Individual Therapy   Germain Osgood, M.A. CCC-SLP [phone]   Germain Osgood 10/14/2013, 4:16 PM

## 2013-10-14 NOTE — Progress Notes (Signed)
Physical Therapy Note  Patient Details  Name: [name] MRN: [id number] Date of Birth: Feb 19, 1931 Today's Date: 10/14/2013 0905-1000 55 min individual therapy No pain reported; premedicated for L rib pain  Tx focused on neuromuscular re-education via forced use, tactile and VCS, demo for L stance stability, alternating bil LE use to propel w/c, gait, bed mobility.  Self stretch bil hamstrings and heel cords in sitting with foot propped on stool.  Gait on carpet with RW x 50' through obstacle course requiring L sidestepping, without LOB or misstep, min guard to supervision assist.  Basic transfer with supervision, min cues.  Sit>< supine on firm mat without rails, tactile and VCs for technique when on R side of bed.  (pt and wife need to be encouraged to switch sides at home)  Pt left in sitting in w/c in room, all needs within reach.  [name] 10/14/2013, 7:54 AM

## 2013-10-14 NOTE — Clinical Social Work Note (Signed)
CSW received call from wife that patient needs DME for home at DC. CSW contacted unit CM. Unit CM to follow up. CSW signing off.  Liz Beach, [place], [place], [phone]

## 2013-10-14 NOTE — Progress Notes (Signed)
Social Work Patient ID: [name], male   DOB: [birth year], 80 y.o.   MRN: [id number] Met with pt and spoke with wife via telephone to discuss DME coverages and need for family education prior to discharge.  Have scheduled For Friday at 10;00 with wife to come in.  She will get tub bench on her own aware of private cost but they want a bsc for home.  Will work toward Discharge Sat.

## 2013-10-14 NOTE — Progress Notes (Signed)
Occupational Therapy Session Notes  Patient Details  Name: [name] MRN: [id number] Date of Birth: [birth year]  Today's Date: 10/14/2013 Time: 0800-0900 and 100-130 Time Calculation (min): 60 min and 30 min  Short Term Goals: Week 2:  OT Short Term Goal 1 (Week 2): STGs=LTGs of overall Supervision to Min assist  Skilled Therapeutic Interventions/Progress Updates:  1)  Patient resting in w/c upon arrival.  Engaged in self care retraining to include shower, toileting, groom and dressing.  Focused session on attending to the left and to the LUE & LLE, sit><stand, dynamic balance in standing and with HHA to ambulate to and from bathroom, attention to task,  practiced use of blue bath mitt on LUE for forced use and to improve success with using his LUE to wash everything he can reach before he begins to use his RUE to finish any portion of bath that was missed. Patient sits for all of bath except standing with grab bar to bathe peri area and buttocks. Patient uses LG sponge to improve independence with LB bath. Patient added 2 tasks to his morning BADL tasks: gather his clothes from drawers and clean up after shower.  Patient practiced carrying items in his left hand and under his forearm and only dropped an item one time.  Patient picked up towels off the floor with LUE and placed them in the hospital laundry bag.   2)  Patient resting in w/c with wife at his side.  Engaged in several attempts to don left shoe with elastic using the [place] shoe horn.  Eventually, patient was taken to the therapy gym to practice sitting EOM with LLE up on mat and knee bent to attempt this technique to donn his shoe.  Patient was successful with vcs.  Patient ambulated while pushing w/c back to his room then transferred to recliner and feet elevated.  Therapy Documentation Precautions:  Precautions Precautions: Fall Precaution Comments: L-sided weakness and inattention Restrictions Weight Bearing Restrictions:  No Pain: 1)  Lower back, left knee and left rhomboid.  Soft tissue massage to trigger point in left rhomboid, Voltaren cream to left knee.  Rest and repositioned  2)  No reports of pain ADL: See FIM for current functional status  Therapy/Group: Individual Therapy both sessions  [place], [place] 10/14/2013, 11:15 AM

## 2013-10-15 ENCOUNTER — Inpatient Hospital Stay (HOSPITAL_COMMUNITY): Payer: Medicare Other | Admitting: *Deleted

## 2013-10-15 ENCOUNTER — Inpatient Hospital Stay (HOSPITAL_COMMUNITY): Payer: Medicare Other

## 2013-10-15 ENCOUNTER — Inpatient Hospital Stay (HOSPITAL_COMMUNITY): Payer: Medicare Other | Admitting: Occupational Therapy

## 2013-10-15 NOTE — Progress Notes (Signed)
Speech Language Pathology Daily Session Note  Patient Details  Name: [name] MRN: [id number] Date of Birth: 12-May-1931  Today's Date: 10/15/2013 Time: 1050-1130 Time Calculation (min): 40 min  Short Term Goals: Week 2: SLP Short Term Goal 1 (Week 2): Pt will recall 4 out of 4 speech intelligibility strategies with Mod I SLP Short Term Goal 2 (Week 2): Pt will demonstrate adequate medication management with Mod I SLP Short Term Goal 3 (Week 2): Pt will demonstrate complex problem solving during funcitonal task wtih Mod I  Skilled Therapeutic Interventions: Skilled treatment focused on cognitive goals. SLP facilitated session with education regarding memory strategies of associations and repetition. Pt participated in new learning task with supervision level verbal cues for recall of rules. Pt utilized memory strategies throughout task with Mod I in a noisy environment. Continue plan of care.    FIM:  Comprehension Comprehension Mode: Auditory Comprehension: 6-Follows complex conversation/direction: With extra time/assistive device Expression Expression Mode: Verbal Expression: 6-Expresses complex ideas: With extra time/assistive device Social Interaction Social Interaction: 5-Interacts appropriately 90% of the time - Needs monitoring or encouragement for participation or interaction. Problem Solving Problem Solving: 5-Solves basic problems: With no assist Memory Memory: 6-More than reasonable amt of time  Pain  No/denies pain  Therapy/Group: Individual Therapy   Germain Osgood, M.A. CCC-SLP [phone]   Germain Osgood 10/15/2013, 4:31 PM

## 2013-10-15 NOTE — Progress Notes (Signed)
Occupational Therapy Session Note  Patient Details  Name: [name] MRN: [id number] Date of Birth: 01-Jul-1931  Today's Date: 10/15/2013 Time: 0800-0900 Time Calculation (min): 60 min  Short Term Goals: Week 2:  OT Short Term Goal 1 (Week 2): STGs=LTGs of overall Supervision to Min assist  Skilled Therapeutic Interventions/Progress Updates:  Patient resting in w/c upon arrival with wife at his side. Engaged in self care retraining to include shower, groom and dressing. Focused session on caregiver education with wife observing and assisted with right TED hose, attending to the left and to the LUE & LLE, sit><stand, dynamic balance in standing and ambulate with RW to gather supplies/clothes and to and from bathroom, attention to task, practiced use of blue bath mitt on LUE for forced use and to improve success with using his LUE to wash everything he can reach before he begins to use his RUE to finish any portion of bath that was missed. Patient sits for all of bath except standing with grab bar to bathe peri area and buttocks. Patient uses LG sponge to improve independence with LB bath.  Wife observed this OT donning left TED stocking while seated in front of patient and wife donned right TED.  Plan for tomorrow is to shower in the therapy tub/shower combo to simulate home environment.  Therapy Documentation Precautions:  Precautions Precautions: Fall Precaution Comments: L-sided weakness and inattention Restrictions Weight Bearing Restrictions: No Pain: 1/10 lower back, rest repositioned ADL: See FIM for current functional status  Therapy/Group: Individual Therapy   Alysen Smylie 10/15/2013, 8:57 AM

## 2013-10-15 NOTE — Progress Notes (Signed)
80 y.o. right-handed male with known history of large meningioma. Admitted 09/20/2013 with increasing left-sided weakness as well as recent fall. Latest MRI scan showed some increased mass effect. Underwent stereotactic right frontoparietal craniotomy for resection of meningioma 09/27/2013 per Dr. Kathyrn Sheriff. Followup MRI of the brain 09/29/2013 showed complete resection of the main tumor mass although there did appear to be some small amount of residual tumor within the superior sagittal sinus. Maintained on Decadron protocol and tapered to 2 mg every 8 hours 10/01/2013. He was tolerating a regular diet. Keppra added for seizure prophylaxis. Acute blood loss anemia 9.4 maintained on iron supplement  Subjective/Complaints: Switching from air mattress to regular, discussed need to roll independantly Review of Systems - otherwise negative Objective: Vital Signs: Blood pressure 116/51, pulse 77, temperature 98.2 F (36.8 C), temperature source Oral, resp. rate 19, weight 85.231 kg (187 lb 14.4 oz), SpO2 99.00%. No results found. No results found for this or any previous visit (from the past 72 hour(s)).   HEENT: normal Cardio: RRR and no murmur Resp: CTA B/L and unlabored GI: BS positive and NT,ND Extremity:  Pulses positive and No Edema Skin:   Intact and Other Scalp wound with staples CDI Neuro: Alert/Oriented, Cranial Nerve II-XII normal, Abnormal Motor 3-/5 L delt, 4/5 Left Bi, tri, grip, 3 L HF, KE, ADF and Abnormal FMC Ataxic/ dec FMC Musc/Skel:  Normal, no tenderness over chest, no pain with l shoulder ROM, Pain over L lower ribs (not new) Gen NAD   Assessment/Plan: 1. Functional deficits secondary to Right frontal meningioma s/p resection which require 3+ hours per day of interdisciplinary therapy in a comprehensive inpatient rehab setting. Physiatrist is providing close team supervision and 24 hour management of active medical problems listed below. Physiatrist and rehab team continue  to assess barriers to discharge/monitor patient progress toward functional and medical goals.  FIM: FIM - Bathing Bathing Steps Patient Completed: Chest;Abdomen;Right upper leg;Left upper leg;Right Arm;Front perineal area;Left Arm;Right lower leg (including foot);Left lower leg (including foot);Buttocks Bathing: 5: Set-up assist to: Adjust water temp  FIM - Upper Body Dressing/Undressing Upper body dressing/undressing steps patient completed: Thread/unthread right sleeve of pullover shirt/dresss;Put head through opening of pull over shirt/dress;Thread/unthread left sleeve of pullover shirt/dress;Pull shirt over trunk Upper body dressing/undressing: 5: Supervision: Safety issues/verbal cues FIM - Lower Body Dressing/Undressing Lower body dressing/undressing steps patient completed: Thread/unthread right pants leg;Thread/unthread left pants leg;Don/Doff right shoe;Don/Doff left shoe;Pull pants up/down;Thread/unthread right underwear leg;Thread/unthread left underwear leg Lower body dressing/undressing: 3: Mod-Patient completed 50-74% of tasks  FIM - Toileting Toileting steps completed by patient: Adjust clothing prior to toileting;Performs perineal hygiene;Adjust clothing after toileting Toileting Assistive Devices: Grab bar or rail for support Toileting: 4: Steadying assist  FIM - Radio producer Devices: Grab bars;Walker Toilet Transfers: 4-To toilet/BSC: Min A (steadying Pt. > 75%);4-From toilet/BSC: Min A (steadying Pt. > 75%)  FIM - Bed/Chair Transfer Bed/Chair Transfer Assistive Devices: Arm rests Bed/Chair Transfer: 5: Bed > Chair or W/C: Supervision (verbal cues/safety issues);5: Chair or W/C > Bed: Supervision (verbal cues/safety issues);4: Sit > Supine: Min A (steadying pt. > 75%/lift 1 leg);4: Supine > Sit: Min A (steadying Pt. > 75%/lift 1 leg)  FIM - Locomotion: Wheelchair Distance: 150 Locomotion: Wheelchair: 5: Travels 150 ft or more: maneuvers on  rugs and over door sills with supervision, cueing or coaxing FIM - Locomotion: Ambulation Locomotion: Ambulation Assistive Devices: Administrator Ambulation/Gait Assistance: 4: Min guard;5: Supervision Locomotion: Ambulation: 2: Travels 50 - 149 ft with  minimal assistance (Pt.>75%)  Comprehension Comprehension Mode: Auditory Comprehension: 5-Understands basic 90% of the time/requires cueing < 10% of the time  Expression Expression Mode: Verbal Expression: 6-Expresses complex ideas: With extra time/assistive device  Social Interaction Social Interaction: 5-Interacts appropriately 90% of the time - Needs monitoring or encouragement for participation or interaction.  Problem Solving Problem Solving: 5-Solves basic 90% of the time/requires cueing < 10% of the time  Memory Memory: 5-Recognizes or recalls 90% of the time/requires cueing < 10% of the time  Medical Problem List and Plan:  1. right frontoparietal meningioma status post resection 09/27/2013 with left hemiplegia   -remove staples today 2. DVT Prophylaxis/Anticoagulation: SCDs. Monitor for any signs of DVT  3. Pain Management: Lidoderm patch to Left ribs, oxycodone as needed. Monitor with increased mobility  4. Neuropsych: This patient is capable of making decisions on his own behalf.  5. Seizure prophylaxis. Keppra 500 mg every 12 hours. Monitor for any seizure activity  6. Acute blood loss anemia. Continue iron.   7. Restless leg syndrome. Requip takes at 5p and 9p at home  8. BPH. Flomax/Enablex as directed. If pt develops orthostatic hypotension may need to hold flomax 9. Hyperlipidemia. Zocor  10.GERD. Protonix  11.  Dysuria- resolved after abx 12.  Sacral skin area poor mobility , add mattress overlay, turning, oob 13.  Constipation  Recurrent increase senna LOS (Days) 12 A FACE TO FACE EVALUATION WAS PERFORMED  KIRSTEINS,ANDREW E 10/15/2013, 6:21 AM

## 2013-10-15 NOTE — Progress Notes (Signed)
Physical Therapy Session Note  Patient Details  Name: [name] MRN: [id number] Date of Birth: Oct 29, 1930  Today's Date: 10/15/2013 Time: 1005-1045  And 13:33-14:15 (40min )   Time Calculation (min): 40 min  Short Term Goals: Week 2:  PT Short Term Goal 1 (Week 2): STG=LTG due to LOS Supervision overall  Skilled Therapeutic Interventions/Progress Updates:  1:2 Wife present today, tx focused on family ed for all aspects of functional mobility. Educated wife on proper safety cues for gait and transfers, and troubleshooting mobility in/around their home.  Pt propelled WC on unity x150' with S and verbal cues for LUE protection.  Gait in controlled setting 2x120' with S overall, but min-guard at times in busy situations and distractions. Pt still with trandelenberg gait pattern L.   Pt navigated home setting with RW and close S on carpet, and trailed bed mobiltiy on R/L, as well as furniture and kitchen mobility. Pt demonstrating improved hand placement and awareness of safety needs.   Ascended/descended 5 stairs with bil rails and min-guard A with cues for sequence.  Car transfer with close S and safety cues.   Pt needed seated rest breaks due to fatigue throughout.     2:2 Tx focused on NMR via forced use, verbal and tactile cues during dynamic standing balance and RLE coordination tasks. See FIM for functional levels  Pt demonstrating decreased righting reactions and balance strategies from standing position. Discussed functional and safety implications.   Performed NMR during step taps to flat cone x10 R/L and x10 alternatingly with min A overall except Mod A for 1 posterior LOB.   Dynamic gait with RW through obstacle course for practice with tight turns and step-overs with RW mgt and MIn A overall.   Worked on increasing righting reactions with static stance on foam pad, then with head turns and reaching inside BOS, and gait on compliant surface to simulate gravel at home.   Pt  left up in recliner with all needs in reach.    Therapy Documentation Precautions:  Precautions Precautions: Fall Precaution Comments: L-sided weakness and inattention Restrictions Weight Bearing Restrictions: No Pain: None   Locomotion : Ambulation Ambulation/Gait Assistance: 4: Min guard;5: Supervision Wheelchair Mobility Distance: 150   See FIM for current functional status  Therapy/Group: Individual Therapy  Kennieth Rad, PT, DPT  10/15/2013, 12:31 PM

## 2013-10-16 ENCOUNTER — Inpatient Hospital Stay (HOSPITAL_COMMUNITY): Payer: Medicare Other

## 2013-10-16 ENCOUNTER — Inpatient Hospital Stay (HOSPITAL_COMMUNITY): Payer: Medicare Other | Admitting: Occupational Therapy

## 2013-10-16 NOTE — Progress Notes (Signed)
Social Work Patient ID: [name], male   DOB: 03-15-1931, 80 y.o.   MRN: [id number] Met with  pt and wife to inform of team conference progression toward goals and discharge 1/31.  Wife is here to go through therapies and then again on Friday. Both pleased with his progress and recovery.  Agreeable to OP therapies and have been to [address] before, where Neuro-rehab is.  Work toward discharge Sat.

## 2013-10-16 NOTE — Progress Notes (Addendum)
Physical Therapy Note  Patient Details  Name: [name] MRN: [id number] Date of Birth: 06/27/1931 Today's Date: 10/16/2013 7517-0017 45 min individual therapy No pain reported at rest; L rib and shoulder pain with bed mobility, premedicated  Wife here for further training of bed mobility, gait, transfers, self stretching, floor transfer.    Instructed pt and wife in self stretching of HS and HC in sitting, foot propped on stool; hand out provided.  Bed mobility on standard bed in ADL apt, using L side of bed: atypical movement patterns but functional for sit>< supine with supervision.  Gait training with RW, x 50' with wife,  on carpet in home setting, in congested areas of kitchen requiring sidestepping L and R, basic transfer, floor transfer.  Discussed safety concerns and fall recovery PRN at home.  Pt moved sit> floor mat easily, but required max assist to move from tall kneeling with bil UEs on bed, to sitting on bed. Pt and wife agree that they will call 911 if pt falls, with or without injury.  LTG for stairs added.  Jaxn Chiquito 10/16/2013, 4:43 PM

## 2013-10-16 NOTE — Progress Notes (Signed)
80 y.o. right-handed male with known history of large meningioma. Admitted 09/20/2013 with increasing left-sided weakness as well as recent fall. Latest MRI scan showed some increased mass effect. Underwent stereotactic right frontoparietal craniotomy for resection of meningioma 09/27/2013 per Dr. Kathyrn Sheriff. Followup MRI of the brain 09/29/2013 showed complete resection of the main tumor mass although there did appear to be some small amount of residual tumor within the superior sagittal sinus. Maintained on Decadron protocol and tapered to 2 mg every 8 hours 10/01/2013. He was tolerating a regular diet. Keppra added for seizure prophylaxis. Acute blood loss anemia 9.4 maintained on iron supplement  Subjective/Complaints: Back pain improved, L rib pain improving L shoulder pain after sleeping on it last noc Review of Systems - otherwise negative Objective: Vital Signs: Blood pressure 133/72, pulse 72, temperature 97.3 F (36.3 C), temperature source Oral, resp. rate 18, weight 85.231 kg (187 lb 14.4 oz), SpO2 96.00%. No results found. No results found for this or any previous visit (from the past 72 hour(s)).   HEENT: normal Cardio: RRR and no murmur Resp: CTA B/L and unlabored GI: BS positive and NT,ND Extremity:  Pulses positive and No Edema Skin:   Intact and Other Scalp wound with staples CDI Neuro: Alert/Oriented, Cranial Nerve II-XII normal, Abnormal Motor 3-/5 L delt, 4/5 Left Bi, tri, grip, 3 L HF, KE, ADF and Abnormal FMC Ataxic/ dec FMC Musc/Skel:  Normal, no tenderness over chest, no pain with l shoulder ROM, Pain over L lower ribs (not new) Gen NAD   Assessment/Plan: 1. Functional deficits secondary to Right frontal meningioma s/p resection which require 3+ hours per day of interdisciplinary therapy in a comprehensive inpatient rehab setting. Physiatrist is providing close team supervision and 24 hour management of active medical problems listed below. Physiatrist and rehab  team continue to assess barriers to discharge/monitor patient progress toward functional and medical goals. Team conference today please see physician documentation under team conference tab, met with team face-to-face to discuss problems,progress, and goals. Formulized individual treatment plan based on medical history, underlying problem and comorbidities. FIM: FIM - Bathing Bathing Steps Patient Completed: Chest;Abdomen;Right upper leg;Left upper leg;Right Arm;Front perineal area;Left Arm;Right lower leg (including foot);Left lower leg (including foot);Buttocks Bathing: 5: Set-up assist to: Adjust water temp  FIM - Upper Body Dressing/Undressing Upper body dressing/undressing steps patient completed: Thread/unthread right sleeve of pullover shirt/dresss;Put head through opening of pull over shirt/dress;Thread/unthread left sleeve of pullover shirt/dress;Pull shirt over trunk Upper body dressing/undressing: 5: Supervision: Safety issues/verbal cues FIM - Lower Body Dressing/Undressing Lower body dressing/undressing steps patient completed: Thread/unthread right pants leg;Thread/unthread left pants leg;Don/Doff right shoe;Don/Doff left shoe;Pull pants up/down;Thread/unthread right underwear leg;Thread/unthread left underwear leg Lower body dressing/undressing: 3: Mod-Patient completed 50-74% of tasks  FIM - Toileting Toileting steps completed by patient: Adjust clothing prior to toileting;Performs perineal hygiene;Adjust clothing after toileting Toileting Assistive Devices: Grab bar or rail for support Toileting: 4: Steadying assist  FIM - Radio producer Devices: Grab bars;Walker Toilet Transfers: 4-To toilet/BSC: Min A (steadying Pt. > 75%);4-From toilet/BSC: Min A (steadying Pt. > 75%)  FIM - Bed/Chair Transfer Bed/Chair Transfer Assistive Devices: Arm rests;Walker Bed/Chair Transfer: 5: Sit > Supine: Supervision (verbal cues/safety issues);4: Supine > Sit:  Min A (steadying Pt. > 75%/lift 1 leg);5: Bed > Chair or W/C: Supervision (verbal cues/safety issues);5: Chair or W/C > Bed: Supervision (verbal cues/safety issues)  FIM - Locomotion: Wheelchair Distance: 150 Locomotion: Wheelchair: 5: Travels 150 ft or more: maneuvers on rugs and over door  sills with supervision, cueing or coaxing FIM - Locomotion: Ambulation Locomotion: Ambulation Assistive Devices: Walker - Rolling Ambulation/Gait Assistance: 4: Min guard;5: Supervision Locomotion: Ambulation: 2: Travels 50 - 149 ft with minimal assistance (Pt.>75%)  Comprehension Comprehension Mode: Auditory Comprehension: 6-Follows complex conversation/direction: With extra time/assistive device  Expression Expression Mode: Verbal Expression: 6-Expresses complex ideas: With extra time/assistive device  Social Interaction Social Interaction: 5-Interacts appropriately 90% of the time - Needs monitoring or encouragement for participation or interaction.  Problem Solving Problem Solving: 5-Solves basic problems: With no assist  Memory Memory: 6-More than reasonable amt of time  Medical Problem List and Plan:  1. right frontoparietal meningioma status post resection 09/27/2013 with left hemiplegia - f/u NS as outpt   2. DVT Prophylaxis/Anticoagulation: SCDs. Monitor for any signs of DVT  3. Pain Management: Lidoderm patch to Left ribs, oxycodone as needed. Monitor with increased mobility  4. Neuropsych: This patient is capable of making decisions on his own behalf.  5. Seizure prophylaxis. Keppra 500 mg every 12 hours. Monitor for any seizure activity  6. Acute blood loss anemia. Continue iron.   7. Restless leg syndrome. Requip takes at 5p and 9p at home  8. BPH. Flomax/Enablex as directed. If pt develops orthostatic hypotension may need to hold flomax 9. Hyperlipidemia. Zocor  10.GERD. Protonix  11.  Dysuria- resolved after abx 12.  Sacral skin area poor mobility , add mattress overlay,  turning, oob 13.  Constipation  Recurrent increase senna LOS (Days) 13 A FACE TO FACE EVALUATION WAS PERFORMED  [name] E 10/16/2013, 9:35 AM

## 2013-10-16 NOTE — Progress Notes (Signed)
Occupational Therapy Session Note  Patient Details  Name: [name] MRN: [id number] Date of Birth: [birth year]  Today's Date: 10/16/2013 Time: 1330-1430 Time Calculation (min): 60 min  Short Term Goals: Week 1:  OT Short Term Goal 1 (Week 1): Pt. will bathe UB with min assist OT Short Term Goal 1 - Progress (Week 1): Met OT Short Term Goal 2 (Week 1): Pt. will bath LB with mod assist  OT Short Term Goal 2 - Progress (Week 1): Met OT Short Term Goal 3 (Week 1): Pt will go from sit to stand with mod assist. OT Short Term Goal 3 - Progress (Week 1): Met OT Short Term Goal 4 (Week 1): Pt. will transfer to toilet with mod assist.   OT Short Term Goal 4 - Progress (Week 1): Met OT Short Term Goal 5 (Week 1): Pt. will dress UB with mod assist. OT Short Term Goal 5 - Progress (Week 1): Met  Skilled Therapeutic Interventions/Progress Updates:    Pt was in therapy gym seated on EOM.  Pt participated in stacking cups at various heights with L UE.  Pt folded and unfolded washcloths using BUE.  Pt ambulated with RW across gym and then stood with RW and utilized B UE to put each washcloth in laundry basket.  Pt took several rest breaks but had no c/o pain.  Pt participated in PROM and AAROM movements of L UE and therapist stretched L UE in external rotation several times. Pt moved B UE in external rotation horizontally several times utilizing a towel to simulate "drying off back".  Pt stood at wall to complete several one arm wall pushups with L UE. Pt required constant cues for keeping body posture in alignment.  Pt was able to maintain grasp and keep objects in L hand when in line of sight, when objects were taken out of sight, but still in L hand pt struggled to maintain a full grasp on the object.  Pt ambulated with RW back to hospital room.  Focus on: Increasing A/AROM and movement in L UE, increasing activity tolerance.    Therapy Documentation Precautions:  Precautions Precautions:  Fall Precaution Comments: L-sided weakness and inattention Restrictions Weight Bearing Restrictions: No    Therapy/Group: Individual Therapy  Anishka Bushard 10/16/2013, 2:49 PM

## 2013-10-16 NOTE — Progress Notes (Signed)
Occupational Therapy Session Notes  Patient Details  Name: [name] MRN: [id number] Date of Birth: [birth year]  Today's Date: 10/16/2013 Time: 0805-0900 and 100-130  Time Calculation (min): 55 min and 30 min  Short Term Goals: Week 2:  OT Short Term Goal 1 (Week 2): STGs=LTGs of overall Supervision to Min assist  Skilled Therapeutic Interventions/Progress Updates:  1)  Patient finishing breakfast seated in his w/c upon arrival with wife at his side.  Engaged in self care retraining to include shower, groom and dress.  Focused session on caregiver education with wife observing and providing occasion vcs and hands on, use of shower in therapy apartment tub shower combo with tub bench to simulate home set up, forced use of LUE, safe tub bench transfers, activity tolerance.  Patient's wife reports feeling better about assisting patient at home and is pleased with his progress.  2)  Patient sleeping in w/c upon arrival.  Engaged in ambulating with RW to therapy apartment while focusing on weight bearing though left hand/LUE, not hiking his right shoulder and full activation of left hip during swing through of RLE. Once in apartment focused on bed positioning on his left side since he reported left shoulder pain this morning after sleeping on his left side last night.  Patient able to remove shoes and with assistance positioned onto his left side with left scapula positioned flat on bed and reporting comfort and no pain.  Patient able to donn both shoes with supervision while seated EOB.  Ambulated to therapy gym and patient awaiting his next therapy session.  Therapy Documentation Precautions:  Precautions Precautions: Fall Precaution Comments: L-sided weakness and inattention Restrictions Weight Bearing Restrictions: No Pain: No reports of pain in both sessions ADL: See FIM for current functional status Therapy/Group: Individual Therapy in both sessions  [place], [place] 10/16/2013,  11:18 AM

## 2013-10-16 NOTE — Patient Care Conference (Signed)
Inpatient RehabilitationTeam Conference and Plan of Care Update Date: 10/16/2013   Time: 10;45 AM    Patient Name: [name]      Medical Record Number: [id number]  Date of Birth: 02/28/1931 Sex: Male         Room/Bed: 4W19C/4W19C-01 Payor Info: Payor: [place] / Plan: BLUE MEDICARE / Product Type: *No Product type* /    Admitting Diagnosis: Meningloma crani  Admit Date/Time:  10/03/2013  6:10 PM Admission Comments: No comment available   Primary Diagnosis:  <principal problem not specified> Principal Problem: <principal problem not specified>  Patient Active Problem List   Diagnosis Date Noted  . Meningioma 09/27/2013  . Unspecified constipation 09/26/2013  . Anemia 09/26/2013  . Dizziness 09/20/2013  . Fall at home 09/20/2013  . Dehydration 09/20/2013  . Orthostasis 09/20/2013  . CKD (chronic kidney disease), stage III 09/20/2013  . Brain mass 09/20/2013  . Gait instability 08/20/2013  . Hypokalemia 10/09/2012  . Ileus 10/08/2012  . Acute kidney injury 10/08/2012  . Hypertension 10/08/2012  . Hyperlipidemia 10/08/2012  . GERD (gastroesophageal reflux disease) 10/08/2012  . Restless leg syndrome 10/08/2012    Expected Discharge Date: Expected Discharge Date: 10/19/13  Team Members Present: Physician leading conference: Dr. Alysia Penna Social Worker Present: Ovidio Kin, LCSW;Jenny Prevatt, LCSW Nurse Present: Dorien Chihuahua, RN PT Present: Georjean Mode, PT;Other (comment) Benjie Karvonen Hobble-PT) OT Present: Antony Salmon, OT SLP Present: Germain Osgood, SLP PPS Coordinator present : Daiva Nakayama, RN, CRRN     Current Status/Progress Goal Weekly Team Focus  Medical   Left HP, wound healing well  maintain stability  caregiver training   Bowel/Bladder   continent of bowel and bladder using medications  remain continent of bowel and bladder  monitor need for and effectiveness of medications   Swallow/Nutrition/ Hydration     Regular  diet        ADL's   Min-Supervision except Mod-Min for LB dressing  Overall Supervision-Min Assist for BADL tasks  left visual spatial and body inattention, forced use of LUE, NMR, dynamic balance with BADL and functional mobility, caregiver education   Mobility   S/min A bed mobiltiy; S transfers, S/min guard for gait with RW; min guard assist steps  S bed mobility and transfers, S gait x150'  and [facility]  Family training, neuro re-ed, gait with RW, stairs, balance, and safety   Communication   intelleigible at conversational level with Mod I  Mod I  goal level   Safety/Cognition/ Behavioral Observations  supervision-Mod I with use of external memory aids and complex problem solving  supervision  education   Pain   lidoderm patch lft rib area  pain managed with treatments  monitor effectiveness of meedications and need for prn meds   Skin   red buttocks, head incision healing  maintain skin integrity  assess and monitor skin q shift      *See Care Plan and progress notes for long and short-term goals.  Barriers to Discharge: see above    Possible Resolutions to Barriers:  Plan D/C this wk    Discharge Planning/Teaching Needs:  Home with wife, family education scheduled Friday at 10;00.  Both pleased with how well he is doing      Team Discussion:  Doing well, no medical issues.  Wife to do family education Friday.  Left inattention getting better.    Revisions to Treatment Plan:  No speech follow up met goals on rehab   Continued  Need for Acute Rehabilitation Level of Care: The patient requires daily medical management by a physician with specialized training in physical medicine and rehabilitation for the following conditions: Daily direction of a multidisciplinary physical rehabilitation program to ensure safe treatment while eliciting the highest outcome that is of practical value to the patient.: Yes Daily medical management of patient stability for increased activity during  participation in an intensive rehabilitation regime.: Yes Daily analysis of laboratory values and/or radiology reports with any subsequent need for medication adjustment of medical intervention for : Neurological problems  Elienai Gailey, Gardiner Rhyme 10/17/2013, 9:24 AM

## 2013-10-16 NOTE — Progress Notes (Signed)
Note reviewed and accurately reflects treatment session.   

## 2013-10-17 ENCOUNTER — Inpatient Hospital Stay (HOSPITAL_COMMUNITY): Payer: Medicare Other

## 2013-10-17 ENCOUNTER — Inpatient Hospital Stay (HOSPITAL_COMMUNITY): Payer: Medicare Other | Admitting: Occupational Therapy

## 2013-10-17 NOTE — Progress Notes (Signed)
Occupational Therapy Session Notes  Patient Details  Name: [name] MRN: [id number] Date of Birth: 09-09-1931  Today's Date: 10/17/2013 Time: 0800-0900 and 100-145 Time Calculation (min): 60 min and 45 min  Short Term Goals: Week 2:  OT Short Term Goal 1 (Week 2): STGs=LTGs of overall Supervision to Min assist  Skilled Therapeutic Interventions/Progress Updates:  1)  Self care retraining to include ambulate in his room without RW to gather clothes and to shower then stand at sink to use mirror to assist with pulling up waistband on underware and pants. Focused session on attending to the left, forced use of LUE, dynamic balance, techniques to don his socks.  2)  Ambulated with RW with supervision to day room to engaged in Neuromuscular Facilitation: Left;Upper Extremity;Lower Extremity;Forced use;Activity to increase motor control;Activity to increase sustained activation;Activity to increase lateral weight shifting LUE Weight Bearing Technique: Forearm seated;Extended arm standing.  Ambulated with L HHA and forced patient to pick up the pace to ambulated back to his room.  He was able to walk faster than usual without LOB.  Therapy Documentation Precautions:  Precautions Precautions: Fall Precaution Comments: L-sided weakness and inattention Restrictions Weight Bearing Restrictions: No Pain: 1)  Left anterior trunk (flank), not rated, premedicated 2)  Occasional pain in left shoulder with end ranges of PROM shoulder flex, ABduct, IR & ER. ADL: See FIM for current functional status  Therapy/Group: Individual Therapy both sessions  [place], [place] 10/17/2013, 4:05 PM

## 2013-10-17 NOTE — Discharge Instructions (Signed)
Inpatient Rehab Discharge Instructions  [name] Discharge date and time: No discharge date for patient encounter.   Activities/Precautions/ Functional Status: Activity: activity as tolerated Diet: regular diet Wound Care: keep wound clean and dry Functional status:  ___ No restrictions     ___ Walk up steps independently _x__ 24/7 supervision/assistance   ___ Walk up steps with assistance ___ Intermittent supervision/assistance  ___ Bathe/dress independently ___ Walk with walker     ___ Bathe/dress with assistance ___ Walk Independently    ___ Shower independently _x__ Walk with assistance    ___ Shower with assistance ___ No alcohol     ___ Return to work/school ________  Special Instructions:    COMMUNITY REFERRALS UPON DISCHARGE:    Outpatient: PT, OT  Agency:[facility] VQMGQ:[phone] Date of Last Service:10/19/2013  Appointment Date/Time:FEBRUARY 4 Wednesday 10;00-11;45 AM  Medical Equipment/Items Ordered:BEDSIDE COMMODE Agency/Supplier:[facility]    [phone]      My questions have been answered and I understand these instructions. I will adhere to these goals and the provided educational materials after my discharge from the hospital.  Patient/Caregiver Signature _______________________________ Date __________  Clinician Signature _______________________________________ Date __________  Please bring this form and your medication list with you to all your follow-up doctor's appointments.

## 2013-10-17 NOTE — Progress Notes (Signed)
Speech Language Pathology Discharge Summary & Final Treatment Note  Patient Details  Name: [name] MRN: [id number] Date of Birth: March 03, 1931  Today's Date: 10/17/2013 Time: 1450-1520 Time Calculation (min): 30 min  Skilled Therapeutic Interventions:  Skilled treatment focused on education with patient, as wife left prior to session. SLP facilitated session with education regarding current speech and cognitive function. Pt reports that he believes he is at his baseline for speech and cognition, and that he does not want further SLP f/u upon discharge. He shared that his wife, who has been present for previous SLP sessions, will be able to provide supervision during complex tasks and that he would rather spend time in PT/OT. Pt participated in functional discussion regarding memory strategies and identified ways that he could implement them at home. Pt verbalized his understanding of all information presented.    Patient has met 3 of 3 long term goals.  Patient to discharge at overall Modified Independent;Supervision level.  Reasons goals not met: N/A   Clinical Impression/Discharge Summary: Pt has met 3 out of 3 LTGs this admission, and feels as though he has returned to baseline with his speech and cognitive function. Pt presents with mild memory deficits that impact higher level cognitive skills and executive functions, however he compensates for them at a Mod I-supervision level. Education has been completed, and pt declines further f/u services. Pt has met all of his goals and is scheduled to d/c home with family support.  Care Partner:  Caregiver Able to Provide Assistance: Yes  Type of Caregiver Assistance: Cognitive  Recommendation:  None;Other (comment) (intermittent supervision for complex tasks)      Equipment:  None recommended by SLP  Reasons for discharge: Treatment goals met;Discharged from hospital   Patient/Family Agrees with Progress Made and Goals Achieved: Yes    See FIM for current functional status   Germain Osgood, M.A. CCC-SLP [phone]   Germain Osgood 10/17/2013, 4:16 PM

## 2013-10-17 NOTE — Progress Notes (Signed)
Physical Therapy Note  Patient Details  Name: [name] MRN: [id number] Date of Birth: 1931/08/04 Today's Date: 10/17/2013 [id number] 57 min individual therapy No pain reported   Wife present for part of session for continued family ed.  tx focused on neuromuscular re-education via forced use, demo, VCs, tactile cues for LLE stance stability during gait, kicking with R foot, repetitive sit>< stand without use of UEs, w/c propulsion using bil LEs without UEs  W/c propulsion x 150' using bil LEs, supervision.  Patient demonstrates increased fall risk as noted by score of 35  /56 on Berg Balance Scale.  (<36= high risk for falls, close to 100%; 37-45 significant >80%; 46-51 moderate >50%; 52-55 lower >25%).  Risk of falls discussed with pt and wife.  Gait with RW x 90' working on velocity, upright trunk and forward gaze.  10 m timed walk test = 1.82'/sec.  Up/down 5 steps with 2 rails with supervision, no cues; up/down 5 steps with L rail with wife, supervision. Pt remembered technique for sequencing without cues.  [name] 10/17/2013, 7:58 AM

## 2013-10-17 NOTE — Progress Notes (Signed)
80 y.o. right-handed male with known history of large meningioma. Admitted 09/20/2013 with increasing left-sided weakness as well as recent fall. Latest MRI scan showed some increased mass effect. Underwent stereotactic right frontoparietal craniotomy for resection of meningioma 09/27/2013 per Dr. Kathyrn Sheriff. Followup MRI of the brain 09/29/2013 showed complete resection of the main tumor mass although there did appear to be some small amount of residual tumor within the superior sagittal sinus. Maintained on Decadron protocol and tapered to 2 mg every 8 hours 10/01/2013. He was tolerating a regular diet. Keppra added for seizure prophylaxis. Acute blood loss anemia 9.4 maintained on iron supplement  Subjective/Complaints: Wife in for family training, no new issues Review of Systems - otherwise negative Objective: Vital Signs: Blood pressure 97/59, pulse 93, temperature 98.2 F (36.8 C), temperature source Oral, resp. rate 18, weight 85.5 kg (188 lb 7.9 oz), SpO2 98.00%. No results found. No results found for this or any previous visit (from the past 72 hour(s)).   HEENT: normal Cardio: RRR and no murmur Resp: CTA B/L and unlabored GI: BS positive and NT,ND Extremity:  Pulses positive and No Edema Skin:   Intact and Other Scalp wound without staples CDI Neuro: Alert/Oriented, Cranial Nerve II-XII normal, Abnormal Motor 3-/5 L delt, 4/5 Left Bi, tri, grip, 3 L HF, KE, ADF and Abnormal FMC Ataxic/ dec FMC Musc/Skel:  Normal, no tenderness over chest, no pain with l shoulder ROM, Pain over L lower ribs (not new) Gen NAD   Assessment/Plan: 1. Functional deficits secondary to Right frontal meningioma s/p resection which require 3+ hours per day of interdisciplinary therapy in a comprehensive inpatient rehab setting. Physiatrist is providing close team supervision and 24 hour management of active medical problems listed below. Physiatrist and rehab team continue to assess barriers to  discharge/monitor patient progress toward functional and medical goals.  FIM: FIM - Bathing Bathing Steps Patient Completed: Chest;Abdomen;Right upper leg;Left upper leg;Right Arm;Front perineal area;Left Arm;Right lower leg (including foot);Left lower leg (including foot);Buttocks Bathing: 5: Set-up assist to: Adjust water temp  FIM - Upper Body Dressing/Undressing Upper body dressing/undressing steps patient completed: Thread/unthread right sleeve of pullover shirt/dresss;Put head through opening of pull over shirt/dress;Thread/unthread left sleeve of pullover shirt/dress;Pull shirt over trunk Upper body dressing/undressing: 5: Supervision: Safety issues/verbal cues FIM - Lower Body Dressing/Undressing Lower body dressing/undressing steps patient completed: Thread/unthread right pants leg;Thread/unthread left pants leg;Don/Doff right shoe;Don/Doff left shoe;Pull pants up/down;Thread/unthread right underwear leg;Thread/unthread left underwear leg;Pull underwear up/down Lower body dressing/undressing: 4: Min-Patient completed 75 plus % of tasks  FIM - Toileting Toileting steps completed by patient: Adjust clothing prior to toileting;Performs perineal hygiene;Adjust clothing after toileting Toileting Assistive Devices: Grab bar or rail for support Toileting: 4: Steadying assist  FIM - Radio producer Devices: Grab bars;Walker Toilet Transfers: 4-To toilet/BSC: Min A (steadying Pt. > 75%);4-From toilet/BSC: Min A (steadying Pt. > 75%)  FIM - Bed/Chair Transfer Bed/Chair Transfer Assistive Devices: Arm rests;Walker Bed/Chair Transfer: 5: Chair or W/C > Bed: Supervision (verbal cues/safety issues);5: Bed > Chair or W/C: Supervision (verbal cues/safety issues);5: Supine > Sit: Supervision (verbal cues/safety issues);5: Sit > Supine: Supervision (verbal cues/safety issues)  FIM - Locomotion: Wheelchair Distance: 150 Locomotion: Wheelchair: 5: Travels 150 ft or more:  maneuvers on rugs and over door sills with supervision, cueing or coaxing FIM - Locomotion: Ambulation Locomotion: Ambulation Assistive Devices: Administrator Ambulation/Gait Assistance: 5: Supervision Locomotion: Ambulation: 2: Travels 50 - 149 ft with supervision/safety issues  Comprehension Comprehension Mode: Auditory Comprehension: 6-Follows  complex conversation/direction: With extra time/assistive device  Expression Expression Mode: Verbal Expression: 7-Expresses complex ideas: With no assist  Social Interaction Social Interaction: 7-Interacts appropriately with others - No medications needed.  Problem Solving Problem Solving: 6-Solves complex problems: With extra time  Memory Memory: 6-More than reasonable amt of time  Medical Problem List and Plan:  1. right frontoparietal meningioma status post resection 09/27/2013 with left hemiplegia - f/u NS as outpt   2. DVT Prophylaxis/Anticoagulation: SCDs. Monitor for any signs of DVT  3. Pain Management: Lidoderm patch to Left ribs, oxycodone as needed. Monitor with increased mobility  4. Neuropsych: This patient is capable of making decisions on his own behalf.  5. Seizure prophylaxis. Keppra 500 mg every 12 hours. Monitor for any seizure activity  6. Acute blood loss anemia. Continue iron.   7. Restless leg syndrome. Requip takes at 5p and 9p at home  8. BPH. Flomax/Enablex as directed. If pt develops orthostatic hypotension may need to hold flomax 9. Hyperlipidemia. Zocor  10.GERD. Protonix  11.  Dysuria- resolved after abx 12.  Sacral skin area poor mobility , add mattress overlay, turning, oob 13.  Constipation  Recurrent increase senna LOS (Days) 14 A FACE TO FACE EVALUATION WAS PERFORMED  Janissa Bertram E 10/17/2013, 8:50 PM

## 2013-10-18 ENCOUNTER — Inpatient Hospital Stay (HOSPITAL_COMMUNITY): Payer: Medicare Other | Admitting: *Deleted

## 2013-10-18 ENCOUNTER — Inpatient Hospital Stay (HOSPITAL_COMMUNITY): Payer: Medicare Other | Admitting: Occupational Therapy

## 2013-10-18 DIAGNOSIS — D320 Benign neoplasm of cerebral meninges: Secondary | ICD-10-CM

## 2013-10-18 DIAGNOSIS — G8110 Spastic hemiplegia affecting unspecified side: Secondary | ICD-10-CM

## 2013-10-18 MED ORDER — LEVETIRACETAM 500 MG PO TABS: 500.0000 mg | ORAL_TABLET | Freq: Two times a day (BID) | ORAL | Status: DC

## 2013-10-18 MED ORDER — OCUVITE PO TABS: 1.0000 | ORAL_TABLET | Freq: Every day | ORAL | Status: DC

## 2013-10-18 MED ORDER — DICLOFENAC SODIUM 1 % TD GEL: 2.0000 g | Freq: Four times a day (QID) | TRANSDERMAL | Status: DC

## 2013-10-18 MED ORDER — ROPINIROLE HCL 1 MG PO TABS
ORAL_TABLET | ORAL | Status: DC
Start: 2013-10-18 — End: 2014-05-05

## 2013-10-18 MED ORDER — ROPINIROLE HCL 1 MG PO TABS: 1.0000 mg | ORAL_TABLET | Freq: Every day | ORAL | Status: DC

## 2013-10-18 MED ORDER — LIDOCAINE 5 % EX PTCH: MEDICATED_PATCH | CUTANEOUS | Status: DC

## 2013-10-18 MED ORDER — OXYCODONE HCL 5 MG PO TABS: 5.0000 mg | ORAL_TABLET | ORAL | Status: DC | PRN

## 2013-10-18 MED ORDER — SOLIFENACIN SUCCINATE 5 MG PO TABS: 5.0000 mg | ORAL_TABLET | Freq: Every day | ORAL | Status: DC

## 2013-10-18 MED ORDER — TAMSULOSIN HCL 0.4 MG PO CAPS: 0.4000 mg | ORAL_CAPSULE | Freq: Every day | ORAL | Status: DC

## 2013-10-18 MED ORDER — SACCHAROMYCES BOULARDII 250 MG PO CAPS: 250.0000 mg | ORAL_CAPSULE | Freq: Every day | ORAL | Status: DC

## 2013-10-18 MED ORDER — OMEPRAZOLE 20 MG PO CPDR: 20.0000 mg | DELAYED_RELEASE_CAPSULE | Freq: Every day | ORAL | Status: DC

## 2013-10-18 MED ORDER — FERROUS SULFATE 325 (65 FE) MG PO TBEC: 325.0000 mg | DELAYED_RELEASE_TABLET | Freq: Every day | ORAL | Status: DC

## 2013-10-18 MED ORDER — SIMVASTATIN 40 MG PO TABS: 40.0000 mg | ORAL_TABLET | Freq: Every evening | ORAL | Status: DC

## 2013-10-18 NOTE — Progress Notes (Addendum)
80 y.o. right-handed male with known history of large meningioma. Admitted 09/20/2013 with increasing left-sided weakness as well as recent fall. Latest MRI scan showed some increased mass effect. Underwent stereotactic right frontoparietal craniotomy for resection of meningioma 09/27/2013 per Dr. Kathyrn Sheriff. Followup MRI of the brain 09/29/2013 showed complete resection of the main tumor mass although there did appear to be some small amount of residual tumor within the superior sagittal sinus. Maintained on Decadron protocol and tapered to 2 mg every 8 hours 10/01/2013. He was tolerating a regular diet. Keppra added for seizure prophylaxis. Acute blood loss anemia 9.4 maintained on iron supplement  Subjective/Complaints: Still has some left rib pain with movement, no SOB or CP Review of Systems - otherwise negative Objective: Vital Signs: Blood pressure 114/53, pulse 81, temperature 98.4 F (36.9 C), temperature source Oral, resp. rate 19, weight 85.5 kg (188 lb 7.9 oz), SpO2 97.00%. No results found. No results found for this or any previous visit (from the past 72 hour(s)).   HEENT: normal Cardio: RRR and no murmur Resp: CTA B/L and unlabored GI: BS positive and NT,ND Extremity:  Pulses positive and No Edema Skin:   Intact and Other Scalp wound without staples CDI Neuro: Alert/Oriented, Cranial Nerve II-XII normal, Abnormal Motor 3-/5 L delt, 4/5 Left Bi, tri, grip, 3 L HF, KE, ADF and Abnormal FMC Ataxic/ dec FMC Musc/Skel:  Normal, no tenderness over chest, no pain with l shoulder ROM, Pain over L lower ribs (not new) Gen NAD   Assessment/Plan: 1. Functional deficits secondary to Right frontal meningioma s/p resection which require 3+ hours per day of interdisciplinary therapy in a comprehensive inpatient rehab setting. Tentative D/C in am D/C instructions with PA today  FIM: FIM - Bathing Bathing Steps Patient Completed: Chest;Abdomen;Right upper leg;Left upper leg;Right Arm;Front  perineal area;Left Arm;Right lower leg (including foot);Left lower leg (including foot);Buttocks Bathing: 5: Set-up assist to: Adjust water temp  FIM - Upper Body Dressing/Undressing Upper body dressing/undressing steps patient completed: Thread/unthread right sleeve of pullover shirt/dresss;Put head through opening of pull over shirt/dress;Thread/unthread left sleeve of pullover shirt/dress;Pull shirt over trunk Upper body dressing/undressing: 5: Supervision: Safety issues/verbal cues FIM - Lower Body Dressing/Undressing Lower body dressing/undressing steps patient completed: Thread/unthread right pants leg;Thread/unthread left pants leg;Don/Doff right shoe;Don/Doff left shoe;Pull pants up/down;Thread/unthread right underwear leg;Thread/unthread left underwear leg;Pull underwear up/down Lower body dressing/undressing: 4: Min-Patient completed 75 plus % of tasks  FIM - Toileting Toileting steps completed by patient: Adjust clothing prior to toileting;Performs perineal hygiene;Adjust clothing after toileting Toileting Assistive Devices: Grab bar or rail for support Toileting: 4: Steadying assist  FIM - Radio producer Devices: Grab bars;Walker Toilet Transfers: 4-To toilet/BSC: Min A (steadying Pt. > 75%);4-From toilet/BSC: Min A (steadying Pt. > 75%)  FIM - Bed/Chair Transfer Bed/Chair Transfer Assistive Devices: Arm rests;Walker Bed/Chair Transfer: 5: Chair or W/C > Bed: Supervision (verbal cues/safety issues);5: Bed > Chair or W/C: Supervision (verbal cues/safety issues);5: Supine > Sit: Supervision (verbal cues/safety issues);5: Sit > Supine: Supervision (verbal cues/safety issues)  FIM - Locomotion: Wheelchair Distance: 150 Locomotion: Wheelchair: 5: Travels 150 ft or more: maneuvers on rugs and over door sills with supervision, cueing or coaxing FIM - Locomotion: Ambulation Locomotion: Ambulation Assistive Devices: Administrator Ambulation/Gait  Assistance: 5: Supervision Locomotion: Ambulation: 2: Travels 50 - 149 ft with supervision/safety issues  Comprehension Comprehension Mode: Auditory Comprehension: 6-Follows complex conversation/direction: With extra time/assistive device  Expression Expression Mode: Verbal Expression: 6-Expresses complex ideas: With extra time/assistive device  Social Interaction Social Interaction: 7-Interacts appropriately with others - No medications needed.  Problem Solving Problem Solving: 6-Solves complex problems: With extra time  Memory Memory: 6-More than reasonable amt of time  Medical Problem List and Plan:  1. right frontoparietal meningioma status post resection 09/27/2013 with left hemiplegia - f/u NS as outpt   2. DVT Prophylaxis/Anticoagulation: SCDs. Monitor for any signs of DVT  3. Pain Management: Lidoderm patch to Left ribs, oxycodone as needed. Monitor with increased mobility  4. Neuropsych: This patient is capable of making decisions on his own behalf.  5. Seizure prophylaxis. Keppra 500 mg every 12 hours. Monitor for any seizure activity  6. Acute blood loss anemia. Continue iron.   7. Restless leg syndrome. Requip takes at 5p and 9p at home  8. BPH. Flomax/Enablex as directed. If pt develops orthostatic hypotension may need to hold flomax 9. Hyperlipidemia. Zocor  10.GERD. Protonix  11.  Dysuria- resolved after abx 12.  Sacral skin area poor mobility , add mattress overlay, turning, oob 13.  Constipation  Recurrent increase senna LOS (Days) 15 A FACE TO FACE EVALUATION WAS PERFORMED  [name] E 10/18/2013, 7:52 AM

## 2013-10-18 NOTE — Progress Notes (Signed)
Social Work Patient ID: [name], male   DOB: [birth year], 80 y.o.   MRN: [id number] Spoke with Hinton Dyer who has authorized pt to 1/31.  Aware of his discharge date.

## 2013-10-18 NOTE — Discharge Summary (Signed)
Discharge summary job [id number]

## 2013-10-18 NOTE — Progress Notes (Signed)
Social Work Discharge Note Discharge Note  The overall goal for the admission was met for:   Discharge location: Yes-HOME WITH WIFE WHO CAN PROVIDE 24 HR SUPERVISION   Length of Stay: Yes-16 DAYS  Discharge activity level: Yes-SUPERVISION WITH CUES  Home/community participation: Yes  Services provided included: MD, RD, PT, OT, SLP, RN, TR, Pharmacy and SW  Financial Services: Private Insurance: BLUE MEDICARE  Follow-up services arranged: Outpatient: [facility]-PT,OT 2/4 10;00-11;45 and DME: [facility]  Comments (or additional information):WIFE COMPLETED FAMILY EDUCATION AND BOTH FEEL PREPARED FOR DISCHARGE TOMORROW  Patient/Family verbalized understanding of follow-up arrangements: Yes  Individual responsible for coordination of the follow-up plan: [name]-WIFE  Confirmed correct DME delivered: ,  G 10/18/2013    ,  G 

## 2013-10-18 NOTE — Progress Notes (Signed)
Physical Therapy Discharge Summary  Patient Details  Name: [name] MRN: [id number] Date of Birth: March 13, 1931  Today's Date: 10/18/2013 Time: 0805-0900 and 15:00-15:30 (16min)  Time Calculation (min): 55 min  Patient has met 10 of 10 long term goals due to improved activity tolerance, improved balance, improved postural control, increased strength, functional use of  left upper extremity and left lower extremity, improved attention, improved awareness and improved coordination.  Patient to discharge at an ambulatory level Supervision.   Patient's care partner is independent to provide the necessary cognitive assistance at discharge.   Recommendation:  Patient will benefit from ongoing skilled PT services in outpatient setting to continue to advance safe functional mobility, address ongoing impairments in dynamic balance, activity tolerance, L-sided attention, coordination, and extensor activation, and minimize fall risk.  Equipment: No equipment provided - pt has a RW, but family did not bring it in for assessment after several requests. Showed pt/family how to assess proper height.   Reasons for discharge: treatment goals met and discharge from hospital  Patient/family agrees with progress made and goals achieved: Yes   Skilled PT treatment 1:2 Tx focused on gait training with RW, NMR via forced use and balance during functional tasks, and transfers from various heights and surfaces on unit and apartment. See DC below for functional status.  Pt/wife educated on RW management for curbs and ramps, and pt completed flight of stairs with safety cues for L UE management.   2:2 Pt participated in community gait and mobility 1x150' and 1x200' with RW and close S over varying and uneven surfaces and inclines.  Pt able to navigate busy settings and tight spaces with S in gift shop. Pt had no LOB, but needed 1 seated rest break and cues for moving slowly for safety. Discussed typical community  access and trouble shooting possible difficulties. Performed transfers from varying surfaces and furniture with S. Pt feeling ready for DC with no further questions or concerns.    PT Discharge Precautions/Restrictions Precautions Precautions: Fall Precaution Comments: L-sided weakness and inattention Restrictions Weight Bearing Restrictions: No Vital Signs   Pain 0.5/10 L ribs - no intervention  needed.   Vision/Perception  Vision - History Baseline Vision: Wears glasses only for reading Visual History: Cataracts Patient Visual Report: No change from baseline Vision - Assessment Eye Alignment: Within Functional Limits Perception Perception: Impaired Inattention/Neglect: Does not attend to left visual field;Does not attend to left side of body (Decreased on L, but improved since eval - min cues needed) Praxis Praxis: Impaired Praxis Impairment Details: Initiation Praxis-Other Comments: Pt needs cues for LUE management  Cognition Overall Cognitive Status: History of cognitive impairments - at baseline Arousal/Alertness: Awake/alert Orientation Level: Oriented X4 Memory: Impaired Memory Impairment: Decreased recall of new information Sensation Sensation Light Touch: Appears Intact Proprioception: Appears Intact Coordination Gross Motor Movements are Fluid and Coordinated: Yes Fine Motor Movements are Fluid and Coordinated: No Coordination and Movement Description: Improved gross mototr for LLE, some delayed timing and exursion remain on LLE Heel Shin Test: Improved accuracy since eval Motor  Motor Motor: Hemiplegia Motor - Skilled Clinical Observations: Improved AROM of LLE in all muscle groups Motor - Discharge Observations: Improved sustained motor activation  Mobility Bed Mobility Rolling Left: 5: Supervision Left Sidelying to Sit: 5: Supervision Sit to Supine: 6: Modified independent (Device/Increase time) Transfers Stand Pivot Transfers: 5: Supervision Stand  Pivot Transfer Details (indicate cue type and reason): Supervision for min safety cues Locomotion  Ambulation Ambulation: Yes Ambulation/Gait Assistance: 5:  Supervision Ambulation Distance (Feet): 175 Feet Assistive device: Rolling walker Ambulation/Gait Assistance Details: Occasional cues for L hand on grip Stairs / Additional Locomotion Stairs: Yes Stairs Assistance: 5: Supervision Stairs Assistance Details (indicate cue type and reason): Cues for hand management Stair Management Technique: Two rails;Step to pattern;Forwards Number of Stairs: 12 Height of Stairs: 6 Ramp: 5: Supervision Curb: 5: Psychiatric nurse: Yes Wheelchair Assistance: 5: Careers information officer: Both lower extermities Wheelchair Parts Management: Needs assistance Distance: 150  Trunk/Postural Assessment  Cervical Assessment Cervical Assessment: Within Scientist, physiological Assessment: Exceptions to [facility] (c/o rib pain from fall prior to admission) Thoracic AROM Overall Thoracic AROM: Due to premorid status Lumbar Assessment Lumbar Assessment: Exceptions to [facility] (Sensitive tailbone, posterior tilt) Postural Control Postural Control: Deficits on evaluation Postural Limitations: Forward flexed with decreased core strength unsupported. No longer needing support in sitting  Balance Static Sitting Balance Static Sitting - Balance Support: No upper extremity supported;Feet supported Static Sitting - Level of Assistance: 6: Modified independent (Device/Increase time) Dynamic Sitting Balance Dynamic Sitting - Balance Support: Right upper extremity supported;Feet supported Dynamic Sitting - Level of Assistance: 6: Modified independent (Device/Increase time) Dynamic Sitting - Balance Activities: Lateral lean/weight shifting;Reaching across midline Sitting balance - Comments: Pt demonstrates safe awareness of limits Static Standing  Balance Static Standing - Balance Support: Right upper extremity supported;Left upper extremity supported Static Standing - Level of Assistance: 5: Stand by assistance Dynamic Standing Balance Dynamic Standing - Balance Support: Right upper extremity supported;Left upper extremity supported;During functional activity Dynamic Standing - Level of Assistance: 5: Stand by assistance Dynamic Standing - Balance Activities: Lateral lean/weight shifting;Forward lean/weight shifting Extremity Assessment  RUE Assessment RUE Assessment: Within Functional Limits LUE Assessment LUE Assessment: Exceptions to [facility] (Decreased AROM and coordination) RLE Assessment RLE Assessment: Within Functional Limits LLE Assessment LLE Assessment: Exceptions to [facility] LLE Strength LLE Overall Strength Comments: 3+/5 hip flexion, 3+/5 knee flex and ext, 3/5 ankle DF  See FIM for current functional status Kennieth Rad, PT, DPT  10/18/2013, 12:32 PM

## 2013-10-18 NOTE — Progress Notes (Signed)
Occupational Therapy Discharge Summary  Patient Details  Name: [name] MRN: [id number] Date of Birth: Dec 17, 1930  Today's Date: 10/18/2013 Time: 1100-1200 and 215-300 Time Calculation (min): 60 min and 45 min  Skilled Intervention: 1)  Patient resting in w/c with wife in room.  Engaged in Caregiver education with focus on patient guiding his wife if assist is needed and wife providing vcs for safety.  Wife independent providing assist and cues during gather clothes with RW, undressing, shower transfer, dressing, grooming and wife donned TED hose with increased time and patient providing assist once socks started.  2)  Patient resting in w/c upon arrival.  Ambualted with HHA to day room to engage in NMR with focus on sit><stands without use of RUE, forced use of RUE, normal movement patterns, gross and fine motor coordination, heavy weight bearing through LLE & LUE with dynamic activities, scapular mobs in sidelying, shoulder A/PROM exercises.  Ambulated back to his room at a fast pace with HHA.  Patient has met 11 of 11 long term goals due to improved activity tolerance, improved balance, postural control, ability to compensate for deficits, functional use of  LEFT upper and LEFT lower extremity, improved attention, improved awareness, improved coordination and attention to left visual field and left side of body.  Patient to discharge at overall Supervision level.  Patient's care partner is independent to provide the necessary physical and cognitive assistance at discharge.    Reasons goals not met: n/a secondary to all goals met  Recommendation:  Patient will benefit from ongoing skilled OT services in outpatient setting to continue to advance functional skills in the area of BADL, iADL and Reduce care partner burden.  Equipment: 3 in 1 and family to obtain a tub bench  Reasons for discharge: treatment goals met and discharge from hospital  Patient/family agrees with progress made and  goals achieved: Yes  OT Discharge Precautions/Restrictions  Precautions Precautions: Fall Precaution Comments: L-sided weakness and inattention Restrictions Weight Bearing Restrictions: No Pain No report of pain in either session ADL Overall Supervision Vision/Perception  Vision - History Baseline Vision: Wears glasses only for reading Visual History: Cataracts Patient Visual Report: No change from baseline Vision - Assessment Eye Alignment: Within Functional Limits Perception Perception: Impaired Inattention/Neglect: Does not attend to left visual field;Does not attend to left side of body Cognition Overall Cognitive Status: History of cognitive impairments - at baseline Arousal/Alertness: Awake/alert Orientation Level: Oriented X4 Memory: Impaired Memory Impairment: Decreased recall of new information Sensation Sensation Light Touch: Appears Intact Hot/Cold: Appears Intact Proprioception: Appears Intact Coordination Gross Motor Movements are Fluid and Coordinated: No (LUE) Fine Motor Movements are Fluid and Coordinated: No (LUE) Coordination and Movement Description: Improved since initial eval 10/04/13 however continues to demonstrate decreased speed, accuracy and rhythm for gross and fine motor LUE Motor  Motor Motor: Hemiplegia Motor - Discharge Observations: Improved sustained motor activation Mobility  Bed Mobility Rolling Left: 5: Supervision Left Sidelying to Sit: 5: Supervision Sit to Supine: 6: Modified independent (Device/Increase time)  Trunk/Postural Assessment  Cervical Assessment Cervical Assessment: Within Functional Limits Thoracic Assessment Thoracic Assessment: Exceptions to [facility] (c/o rib pain from fall prior to admission) Thoracic AROM Overall Thoracic AROM: Due to premorid status  Balance Static Sitting Balance Static Sitting - Balance Support: No upper extremity supported;Feet supported Static Sitting - Level of Assistance: 6: Modified  independent (Device/Increase time) Dynamic Sitting Balance Dynamic Sitting - Balance Support: Right upper extremity supported;Feet supported Dynamic Sitting - Level of Assistance: 6: Modified  independent (Device/Increase time) Dynamic Sitting - Balance Activities: Lateral lean/weight shifting;Reaching across midline Sitting balance - Comments: Pt demonstrates safe awareness of limits Static Standing Balance Static Standing - Balance Support: Right upper extremity supported;Left upper extremity supported Static Standing - Level of Assistance: 5: Stand by assistance Dynamic Standing Balance Dynamic Standing - Balance Support: Right upper extremity supported;Left upper extremity supported;During functional activity Dynamic Standing - Level of Assistance: 5: Stand by assistance Dynamic Standing - Balance Activities: Lateral lean/weight shifting;Forward lean/weight shifting Extremity/Trunk Assessment RUE Assessment RUE Assessment: Within Functional Limits LUE Assessment LUE Assessment: Exceptions to WFL LUE AROM (degrees) LUE Overall AROM Comments: distal to shoulder WFL, seated position: ~90 degrees flex and abduction before abnormal movement patterns begin LUE PROM (degrees) LUE Overall PROM Comments: shoulder in sidelying: WFL ABduction, pain in end ranges of flexion, IR & ER LUE Strength LUE Overall Strength Comments: Arm-Brunnstrom Stage IV-V, Hand Stage V-VI  See FIM for current functional status  Joyelle Siedlecki 10/18/2013, 3:22 PM

## 2013-10-18 NOTE — Discharge Summary (Signed)
[name], [name] NO.:  [id number]  MEDICAL RECORD NO.:  [id number]  LOCATION:  [id number]                        FACILITY:  [facility]  PHYSICIAN:  Charlett Blake, M.D.DATE OF BIRTH:  12-Aug-1931  DATE OF ADMISSION:  10/03/2013 DATE OF DISCHARGE:  10/19/2013                              DISCHARGE SUMMARY   DISCHARGE DIAGNOSES: 1. Right frontoparietal meningioma with resection. 2. Sequential compression devices for DVT prophylaxis. 3. Seizure prophylaxis. 4. Acute blood loss anemia. 5. Restless legs syndrome. 6. Benign prostatic hypertrophy. 7. Hyperlipidemia. 8. Gastroesophageal reflux disease. 9. Sacral skin area. 10.Constipation.  HISTORY OF PRESENT ILLNESS:  This is an 79 year old right-handed male with history of large meningioma who was admitted September 20, 2013, with increasing left-sided weakness as well as recent fall.  Latest MRI scan showed some increased mass effect.  Underwent stereotactic right frontoparietal craniotomy for resection of meningioma September 27, 2013, per Dr. Kathyrn Sheriff.  Followup MRI of the brain September 29, 2013, showed complete resection of a main tumor mass although there did appear to be some small amount of residual tumor within the suprasagittal sinus. Maintained on Decadron protocol.  He was tolerating a regular diet. Keppra for seizure prophylaxis.  Acute blood loss anemia 9.4 and monitored.  Physical and occupational therapy ongoing.  The patient was admitted for a comprehensive rehab program.  PAST MEDICAL HISTORY:  See discharge diagnoses.  SOCIAL HISTORY:  Lives with spouse.  FUNCTIONAL HISTORY PRIOR TO ADMISSION:  Using a rolling walker until the week before admission.  FUNCTIONAL MOBILITY PRIOR TO ADMISSION:  Moderate assist for scooting to the head of the bed, max assist sit to stand.  PHYSICAL EXAMINATION:  VITAL SIGNS:  Blood pressure 122/52, pulse 77, temperature 97, respirations 18. GENERAL:  This was an  alert male, oriented x3. HEENT:  Pupils round and reactive to light. LUNGS:  Clear to auscultation. CARDIAC:  Regular rate and rhythm. ABDOMEN:  Soft, nontender.  Good bowel sounds. NEUROLOGIC:  He did have a mild right gaze preference.  Craniotomy site clean and dry.  REHABILITATION HOSPITAL COURSE:  The patient was admitted to Inpatient Rehab Services with therapies initiated on a 3-hour daily basis consisting of physical therapy, occupational therapy, and rehabilitation therapy and rehabilitation nursing.  The following issues were addressed during the patient's rehabilitation stay.  Pertaining to [name] right frontoparietal meningioma with resection remained stable and surgical site healing nicely, he would follow up with Neurosurgery. Sequential compression devices were placed for DVT prophylaxis.  Pain management with the use of a Lidoderm patch and oxycodone for breakthrough pain.  He remained on Keppra for seizure prophylaxis and no seizure activity.  He will continue on Requip for history of restless legs syndrome.  He did have a history of benign prostatic hypertrophy maintained on Flomax as well as Enablex voiding without difficulty.  He will continue Zocor for history of hyperlipidemia.  He did have some bouts of constipation resolved with laxative assistance.  The patient received weekly collaborative interdisciplinary team conferences to discuss estimated length of stay, family teaching, and any barriers to discharge.  He was ambulating 90 feet with a rolling walker, working on velocity, upward  trunk and forward gaze up and down 5 steps with 2 rails using supervision.  The patient can ambulate to his room with a rolling walker to gather his clothes and to shower and to stand at sink side to prepare for his morning activities.  Full family teaching was completed and plan was to be discharged to home.  DISCHARGE MEDICATIONS: 1. Tylenol as needed. 2. Ocuvite 1 tablet  daily. 3. Enablex 7.5 mg p.o. daily. 4. Voltaren gel 4 times daily as needed to affected area. 5. Ferrous sulfate 325 mg p.o. daily. 6. Keppra 500 mg p.o. b.i.d.. 7. Lidoderm patch as directed. 8. Oxycodone immediate release 5 mg 1 tablet every 4 hours as needed     for pain, dispensed of 60 tablets. 9. Protonix 40 mg p.o. daily. 10.Requip 1 mg daily and 1 mg at bedtime. 11.Florastor 250 mg p.o. daily. 12.Senokot 1 tablet b.i.d. 13.Zocor 40 mg p.o. daily. 14.Flomax 0.4 mg p.o. daily.  DIET:  Regular.  SPECIAL INSTRUCTIONS:  The patient would follow up with Dr. Alysia Penna on November 15, 2013, at the outpatient rehab office, Dr. Kathyrn Sheriff, Neurosurgery 2 weeks call for appointment, Dr. Melinda Crutch, October 29, 2013.  Ongoing therapies were arranged as per Rehab Services at outpatient.    Lauraine Rinne, P.A.   ______________________________ Charlett Blake, M.D.   DA/MEDQ  D:  10/18/2013  T:  10/18/2013  Job:  [id number]  cc:   [name]) [name], M.D.

## 2013-10-19 DIAGNOSIS — D320 Benign neoplasm of cerebral meninges: Secondary | ICD-10-CM

## 2013-10-19 DIAGNOSIS — G8110 Spastic hemiplegia affecting unspecified side: Secondary | ICD-10-CM

## 2013-10-19 NOTE — Progress Notes (Signed)
80 y.o. right-handed male with known history of large meningioma. Admitted 09/20/2013 with increasing left-sided weakness as well as recent fall. Latest MRI scan showed some increased mass effect. Underwent stereotactic right frontoparietal craniotomy for resection of meningioma 09/27/2013 per Dr. Kathyrn Sheriff. Followup MRI of the brain 09/29/2013 showed complete resection of the main tumor mass although there did appear to be some small amount of residual tumor within the superior sagittal sinus. Maintained on Decadron protocol and tapered to 2 mg every 8 hours 10/01/2013. He was tolerating a regular diet. Keppra added for seizure prophylaxis. Acute blood loss anemia 9.4 maintained on iron supplement  Subjective/Complaints: Eager to go home No complaints Objective: Vital Signs: Blood pressure 103/58, pulse 77, temperature 98.4 F (36.9 C), temperature source Oral, resp. rate 17, weight 188 lb 7.9 oz (85.5 kg), SpO2 97.00%. nad  Chest cta cv reg rate Neuro- alert and oriented  Assessment/Plan: 1. Functional deficits secondary to Right frontal meningioma s/p resection  Dc today Medical Problem List and Plan:  1. right frontoparietal meningioma status post resection 09/27/2013 with left hemiplegia - f/u NS as outpt   2. DVT Prophylaxis/Anticoagulation: SCDs. Monitor for any signs of DVT  3. Pain Management: Lidoderm patch to Left ribs, oxycodone as needed. Monitor with increased mobility  4. Neuropsych: This patient is capable of making decisions on his own behalf.  5. Seizure prophylaxis. Keppra 500 mg every 12 hours. Monitor for any seizure activity  6. Acute blood loss anemia. Continue iron.   7. Restless leg syndrome. Requip takes at 5p and 9p at home  8. BPH. Flomax/Enablex as directed. If pt develops orthostatic hypotension may need to hold flomax 9. Hyperlipidemia. Zocor  10.GERD. Protonix  11.  Dysuria- resolved after abx 12.  Sacral skin area poor mobility , add mattress overlay,  turning, oob 13.  Constipation  Recurrent increase senna LOS (Days) 16 A FACE TO FACE EVALUATION WAS PERFORMED  Xinyi [name] Paul 10/19/2013, 8:36 AM

## 2013-10-19 NOTE — Progress Notes (Signed)
Pt discharged at [place] with wife to home. Discharge instructions given to pt and wife by Silvestre Mesi with verbal understanding.

## 2013-10-23 ENCOUNTER — Ambulatory Visit: Payer: Medicare Other | Admitting: Physical Therapy

## 2013-10-23 ENCOUNTER — Ambulatory Visit: Payer: Medicare Other | Attending: Physical Medicine & Rehabilitation | Admitting: Occupational Therapy

## 2013-10-23 DIAGNOSIS — IMO0001 Reserved for inherently not codable concepts without codable children: Secondary | ICD-10-CM | POA: Insufficient documentation

## 2013-10-23 DIAGNOSIS — R279 Unspecified lack of coordination: Secondary | ICD-10-CM | POA: Insufficient documentation

## 2013-10-23 DIAGNOSIS — M6281 Muscle weakness (generalized): Secondary | ICD-10-CM | POA: Insufficient documentation

## 2013-10-23 DIAGNOSIS — R4189 Other symptoms and signs involving cognitive functions and awareness: Secondary | ICD-10-CM | POA: Insufficient documentation

## 2013-10-28 ENCOUNTER — Ambulatory Visit: Payer: Medicare Other

## 2013-10-28 ENCOUNTER — Ambulatory Visit: Payer: Medicare Other | Admitting: *Deleted

## 2013-10-31 ENCOUNTER — Ambulatory Visit: Payer: Medicare Other

## 2013-10-31 ENCOUNTER — Ambulatory Visit: Payer: Medicare Other | Admitting: *Deleted

## 2013-11-06 ENCOUNTER — Ambulatory Visit: Payer: Medicare Other | Admitting: Occupational Therapy

## 2013-11-06 ENCOUNTER — Ambulatory Visit: Payer: Medicare Other | Admitting: Physical Therapy

## 2013-11-08 ENCOUNTER — Ambulatory Visit: Payer: Medicare Other | Admitting: Occupational Therapy

## 2013-11-08 ENCOUNTER — Ambulatory Visit: Payer: Medicare Other | Admitting: Physical Therapy

## 2013-11-13 ENCOUNTER — Ambulatory Visit: Payer: Medicare Other | Admitting: Occupational Therapy

## 2013-11-13 ENCOUNTER — Ambulatory Visit: Payer: Medicare Other

## 2013-11-15 ENCOUNTER — Encounter: Payer: Self-pay | Admitting: Physical Medicine & Rehabilitation

## 2013-11-15 ENCOUNTER — Ambulatory Visit: Payer: Medicare Other

## 2013-11-15 ENCOUNTER — Ambulatory Visit (HOSPITAL_BASED_OUTPATIENT_CLINIC_OR_DEPARTMENT_OTHER): Payer: Medicare Other | Admitting: Physical Medicine & Rehabilitation

## 2013-11-15 ENCOUNTER — Ambulatory Visit: Payer: Medicare Other | Admitting: Occupational Therapy

## 2013-11-15 ENCOUNTER — Encounter: Payer: Medicare Other | Attending: Physical Medicine & Rehabilitation

## 2013-11-15 VITALS — BP 118/67 | HR 103 | Resp 14 | Ht 66.0 in | Wt 209.0 lb

## 2013-11-15 DIAGNOSIS — D320 Benign neoplasm of cerebral meninges: Secondary | ICD-10-CM

## 2013-11-15 DIAGNOSIS — G8190 Hemiplegia, unspecified affecting unspecified side: Secondary | ICD-10-CM | POA: Insufficient documentation

## 2013-11-15 DIAGNOSIS — D329 Benign neoplasm of meninges, unspecified: Secondary | ICD-10-CM

## 2013-11-15 DIAGNOSIS — M1710 Unilateral primary osteoarthritis, unspecified knee: Secondary | ICD-10-CM | POA: Insufficient documentation

## 2013-11-15 DIAGNOSIS — G8110 Spastic hemiplegia affecting unspecified side: Secondary | ICD-10-CM

## 2013-11-15 MED ORDER — DICLOFENAC SODIUM 1 % TD GEL: 2.0000 g | Freq: Four times a day (QID) | TRANSDERMAL | Status: DC

## 2013-11-15 NOTE — Progress Notes (Signed)
Subjective:    Patient ID: [name], male    DOB: [birth year], 80 y.o.   MRN: [id number] This is an 80 year old right-handed male  with history of large meningioma who was admitted September 20, 2013, with  increasing left-sided weakness as well as recent fall. Latest MRI scan  showed some increased mass effect. Underwent stereotactic right  frontoparietal craniotomy for resection of meningioma September 27, 2013,  per Dr. Kathyrn Sheriff. Followup MRI of the brain September 29, 2013, showed  complete resection of a main tumor mass although there did appear to be  some small amount of residual tumor within the suprasagittal sinus.  Maintained on Decadron protocol.  HPI Followup from rehabilitation hospitalization DATE OF ADMISSION: 10/03/2013  DATE OF DISCHARGE: 10/19/2013  Currently in outpatient therapy. Now using a cane to ambulate. Has seen neurosurgery. Repeat visit in 3 months and repeat MRI in approximately 6 months. Has seen primary M.D., blood pressure medications adjusted.  Patient with left knee arthritis request a refill of Voltaren gel  Using PEG board Independent with dressing and bathing  C/o Bilat LE swelling Pain Inventory Average Pain 0 Pain Right Now 0 My pain is aching  In the last 24 hours, has pain interfered with the following? General activity 2 Relation with others 2 Enjoyment of life 2 What TIME of day is your pain at its worst? varies Sleep (in general) Good  Pain is worse with: standing and some activites Pain improves with: rest Relief from Meds: 5  Mobility walk with assistance use a cane ability to climb steps?  yes do you drive?  no transfers alone Do you have any goals in this area?  yes  Function retired  Neuro/Psych trouble walking  Prior Studies Any changes since last visit?  yes x-rays CT/MRI  Physicians involved in your care Any changes since last visit?  no   Family History  Problem Relation Age of Onset  . Parkinsonism  Mother   . Dementia Father    History   Social History  . Marital Status: Married    Spouse Name: Mardene Celeste    Number of Children: 2  . Years of Education: 12   Occupational History  . Retired, Furniture conservator/restorer    Social History Main Topics  . Smoking status: Never Smoker   . Smokeless tobacco: Former Systems developer    Types: [place] date: 10/08/1973  . Alcohol Use: No  . Drug Use: No  . Sexual Activity: None   Other Topics Concern  . None   Social History Narrative   Patient is Producer, television/film/video) lives with wife in [place].  Normally independent of ADLs.   Patient is retired.   Patient has two children.   Patient has a high school education.   Right handed         Past Surgical History  Procedure Laterality Date  . Joint replacement      right knee replacement  . Bilateral cateract surgery    . Vasectomy    . Tonsillectomy    . Colonoscopy      colon polyp removed  . Craniotomy Right 09/27/2013    Procedure: RIGHT FRONTAL CRANIOTOMY FOR TUMOR RESECTION ;  Surgeon: Consuella Lose, MD;  Location: [facility];  Service: Neurosurgery;  Laterality: Right;   Past Medical History  Diagnosis Date  . Anemia   . Prostate cancer   . Basal cell cancer   . Hypertension   . Hyperlipidemia   . Restless leg   .  GERD (gastroesophageal reflux disease)   . Brain tumor    BP 118/67  Pulse 103  Resp 14  Ht 5\' 6"  (1.676 m)  Wt 209 lb (94.802 kg)  BMI 33.75 kg/m2  SpO2 95%  Opioid Risk Score:   Fall Risk Score: High Fall Risk (>13 points) (pt educated on fall risk, brochure given to pt.)   Review of Systems  Cardiovascular: Positive for leg swelling.  Musculoskeletal: Positive for gait problem.  All other systems reviewed and are negative.       Objective:   Physical Exam  Motor strength is 4/5 in the left deltoid, bicep, tricep, grip 5/5 in the right deltoid, bicep, tricep, grip Decreased finger to thumb opposition on the left side Positive dysdiadochokinesis on  the left side  Ambulates without assistive device indoors no evidence of toe drag or knee instability. There's a wide base of support Visual fields are intact the confrontational testing.  bilateral 2+ pitting edema   Hyperactive reflexes on the left side, clonus at the left ankle     Assessment & Plan:  1. Right frontal parietal meningioma status post resection with residual left hemiparesis mainly affecting the upper. Also has some mild to moderate balance deficits. Recommend continue outpatient PT OT. 2. Left knee osteoarthritis responding well to voltaren gel. Will refill. knee injection if this worsens       No driving for now possibly next month No history of seizures but was on a prophylactic agent, Keppra, instructed patient to ask neurosurgery whether this needs to be continued   Over half of the 25 min visit was spent counseling and coordinating care.

## 2013-11-15 NOTE — Patient Instructions (Signed)
Do not put extra salt on your food  Please start wearing the support hose once again  You may discuss starting a fluid pill with Dr. Harrington Challenger.  You did have a normal venous Doppler while in the hospital and I doubt you have a blood clot

## 2013-11-18 ENCOUNTER — Telehealth: Payer: Self-pay

## 2013-11-18 ENCOUNTER — Ambulatory Visit: Payer: Medicare Other | Attending: Physical Medicine & Rehabilitation

## 2013-11-18 ENCOUNTER — Ambulatory Visit: Payer: Medicare Other | Admitting: Occupational Therapy

## 2013-11-18 DIAGNOSIS — R4189 Other symptoms and signs involving cognitive functions and awareness: Secondary | ICD-10-CM | POA: Insufficient documentation

## 2013-11-18 DIAGNOSIS — IMO0001 Reserved for inherently not codable concepts without codable children: Secondary | ICD-10-CM | POA: Insufficient documentation

## 2013-11-18 DIAGNOSIS — R279 Unspecified lack of coordination: Secondary | ICD-10-CM | POA: Insufficient documentation

## 2013-11-18 DIAGNOSIS — M6281 Muscle weakness (generalized): Secondary | ICD-10-CM | POA: Insufficient documentation

## 2013-11-18 NOTE — Telephone Encounter (Signed)
Patient called requesting [facility]  Per last office note patient was to see if his nuerosurgeon wants him to continue this medication.  Left message for patient to call office to let us know.

## 2013-11-19 NOTE — Telephone Encounter (Signed)
Patient returned call

## 2013-11-19 NOTE — Telephone Encounter (Signed)
Advised patient to contact neurosurgeon to find out if he needs to continue keppra.

## 2013-11-21 ENCOUNTER — Ambulatory Visit: Payer: Medicare Other | Admitting: Occupational Therapy

## 2013-11-21 ENCOUNTER — Ambulatory Visit: Payer: Medicare Other

## 2013-11-25 ENCOUNTER — Ambulatory Visit: Payer: Medicare Other

## 2013-11-28 ENCOUNTER — Ambulatory Visit: Payer: Medicare Other

## 2013-12-02 ENCOUNTER — Ambulatory Visit: Payer: Medicare Other

## 2013-12-05 ENCOUNTER — Ambulatory Visit: Payer: Medicare Other

## 2013-12-10 ENCOUNTER — Ambulatory Visit: Payer: Medicare Other | Admitting: Occupational Therapy

## 2013-12-10 ENCOUNTER — Ambulatory Visit: Payer: Medicare Other

## 2013-12-12 ENCOUNTER — Ambulatory Visit: Payer: Medicare Other | Admitting: Occupational Therapy

## 2013-12-12 ENCOUNTER — Ambulatory Visit: Payer: Medicare Other

## 2013-12-17 ENCOUNTER — Ambulatory Visit: Payer: Medicare Other | Admitting: Occupational Therapy

## 2013-12-17 ENCOUNTER — Ambulatory Visit: Payer: Medicare Other

## 2013-12-18 ENCOUNTER — Ambulatory Visit: Payer: Medicare Other | Attending: Physical Medicine & Rehabilitation

## 2013-12-18 ENCOUNTER — Ambulatory Visit: Payer: Medicare Other | Admitting: Occupational Therapy

## 2013-12-18 DIAGNOSIS — R4189 Other symptoms and signs involving cognitive functions and awareness: Secondary | ICD-10-CM | POA: Insufficient documentation

## 2013-12-18 DIAGNOSIS — R279 Unspecified lack of coordination: Secondary | ICD-10-CM | POA: Insufficient documentation

## 2013-12-18 DIAGNOSIS — M6281 Muscle weakness (generalized): Secondary | ICD-10-CM | POA: Insufficient documentation

## 2013-12-18 DIAGNOSIS — IMO0001 Reserved for inherently not codable concepts without codable children: Secondary | ICD-10-CM | POA: Insufficient documentation

## 2013-12-19 ENCOUNTER — Ambulatory Visit: Payer: Medicare Other | Admitting: Physical Medicine & Rehabilitation

## 2013-12-23 ENCOUNTER — Encounter: Payer: Medicare Other | Attending: Physical Medicine & Rehabilitation

## 2013-12-23 ENCOUNTER — Encounter: Payer: Self-pay | Admitting: Physical Medicine & Rehabilitation

## 2013-12-23 ENCOUNTER — Ambulatory Visit (HOSPITAL_BASED_OUTPATIENT_CLINIC_OR_DEPARTMENT_OTHER): Payer: Medicare Other | Admitting: Physical Medicine & Rehabilitation

## 2013-12-23 VITALS — BP 138/77 | HR 98 | Resp 14 | Ht 67.0 in | Wt 206.0 lb

## 2013-12-23 DIAGNOSIS — D329 Benign neoplasm of meninges, unspecified: Secondary | ICD-10-CM

## 2013-12-23 DIAGNOSIS — G8190 Hemiplegia, unspecified affecting unspecified side: Secondary | ICD-10-CM | POA: Insufficient documentation

## 2013-12-23 DIAGNOSIS — M1710 Unilateral primary osteoarthritis, unspecified knee: Secondary | ICD-10-CM | POA: Insufficient documentation

## 2013-12-23 DIAGNOSIS — D320 Benign neoplasm of cerebral meninges: Secondary | ICD-10-CM

## 2013-12-23 DIAGNOSIS — IMO0002 Reserved for concepts with insufficient information to code with codable children: Secondary | ICD-10-CM

## 2013-12-23 DIAGNOSIS — M1712 Unilateral primary osteoarthritis, left knee: Secondary | ICD-10-CM

## 2013-12-23 NOTE — Progress Notes (Signed)
Subjective:    Patient ID: [name], male    DOB: March 18, 1931, 80 y.o.   MRN: [id number]  HPI Pain Inventory Average Pain 3 Pain Right Now 0 My pain is intermittent and aching  In the last 24 hours, has pain interfered with the following? General activity 0 Relation with others 0 Enjoyment of life 0 What TIME of day is your pain at its worst? varies Sleep (in general) Good  Pain is worse with: bending and some activites Pain improves with: therapy/exercise and medication Relief from Meds: 7  Mobility walk with assistance use a cane ability to climb steps?  yes Do you have any goals in this area?  yes  Function retired  Neuro/Psych trouble walking  Prior Studies Any changes since last visit?  no  Physicians involved in your care Any changes since last visit?  no   Family History  Problem Relation Age of Onset  . Parkinsonism Mother   . Dementia Father    History   Social History  . Marital Status: Married    Spouse Name: Mardene Celeste    Number of Children: 2  . Years of Education: 12   Occupational History  . Retired, Furniture conservator/restorer    Social History Main Topics  . Smoking status: Never Smoker   . Smokeless tobacco: Former Systems developer    Types: [place] date: 10/08/1973  . Alcohol Use: No  . Drug Use: No  . Sexual Activity: None   Other Topics Concern  . 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  Normally independent of ADLs.   Patient is retired.   Patient has two children.   Patient has a high school education.   Right handed         Past Surgical History  Procedure Laterality Date  . Joint replacement      right knee replacement  . Bilateral cateract surgery    . Vasectomy    . Tonsillectomy    . Colonoscopy      colon polyp removed  . Craniotomy Right 09/27/2013    Procedure: RIGHT FRONTAL CRANIOTOMY FOR TUMOR RESECTION ;  Surgeon: Consuella Lose, MD;  Location: [facility];   Service: Neurosurgery;  Laterality: Right;   Past Medical History  Diagnosis Date  . Anemia   . Prostate cancer   . Basal cell cancer   . Hypertension   . Hyperlipidemia   . Restless leg   . GERD (gastroesophageal reflux disease)   . Brain tumor    BP 138/77  Pulse 98  Resp 14  Ht 5\' 7"  (1.702 m)  Wt 206 lb (93.441 kg)  BMI 32.26 kg/m2  SpO2 96%  Opioid Risk Score:   Fall Risk Score: Moderate Fall Risk (6-13 points) (pt educated and given brochure on fall risk)    Review of Systems  Musculoskeletal: Positive for gait problem.  Neurological: Positive for weakness and numbness.  All other systems reviewed and are negative.       Objective:   Physical Exam        Assessment & Plan:     Subjective:    Patient ID: [name], male    DOB: 05/05/1931, 80 y.o.   MRN: [id number] This is an 80 year old right-handed male  with history of large meningioma who was admitted September 20, 2013, with  increasing left-sided weakness as well as recent fall. Latest MRI scan  showed some  increased mass effect. Underwent stereotactic right  frontoparietal craniotomy for resection of meningioma September 27, 2013,  per Dr. Kathyrn Sheriff. Followup MRI of the brain September 29, 2013, showed  complete resection of a main tumor mass although there did appear to be  some small amount of residual tumor within the suprasagittal sinus.  Maintained on Decadron protocol.  HPI Followup from rehabilitation hospitalization DATE OF ADMISSION: 10/03/2013  DATE OF DISCHARGE: 10/19/2013  Currently in outpatient therapy. Now using a cane to ambulate. Has seen neurosurgery. Repeat visit in 3 months and repeat MRI in approximately 6 months. Has seen primary M.D., blood pressure medications adjusted.  Patient with left knee arthritis request a refill of Voltaren gel  Using PEG board Independent with dressing and bathing  C/o Bilat LE swelling Pain Inventory Average Pain 0 Pain Right Now 0 My  pain is aching  In the last 24 hours, has pain interfered with the following? General activity 2 Relation with others 2 Enjoyment of life 2 What TIME of day is your pain at its worst? varies Sleep (in general) Good  Pain is worse with: standing and some activites Pain improves with: rest Relief from Meds: 5  Mobility walk with assistance use a cane ability to climb steps?  yes do you drive?  no transfers alone Do you have any goals in this area?  yes  Function retired  Neuro/Psych trouble walking  Prior Studies Any changes since last visit?  yes x-rays CT/MRI  Physicians involved in your care Any changes since last visit?  no   Family History  Problem Relation Age of Onset  . Parkinsonism Mother   . Dementia Father    History   Social History  . Marital Status: Married    Spouse Name: Mardene Celeste    Number of Children: 2  . Years of Education: 12   Occupational History  . Retired, Furniture conservator/restorer    Social History Main Topics  . Smoking status: Never Smoker   . Smokeless tobacco: Former Systems developer    Types: [place] date: 10/08/1973  . Alcohol Use: No  . Drug Use: No  . Sexual Activity: None   Other Topics Concern  . 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.  Normally independent of ADLs.   Patient is retired.   Patient has two children.   Patient has a high school education.   Right handed         Past Surgical History  Procedure Laterality Date  . Joint replacement      right knee replacement  . Bilateral cateract surgery    . Vasectomy    . Tonsillectomy    . Colonoscopy      colon polyp removed  . Craniotomy Right 09/27/2013    Procedure: RIGHT FRONTAL CRANIOTOMY FOR TUMOR RESECTION ;  Surgeon: Consuella Lose, MD;  Location: [facility];  Service: Neurosurgery;  Laterality: Right;   Past Medical History  Diagnosis Date  . Anemia   . Prostate cancer   . Basal cell cancer   .  Hypertension   . Hyperlipidemia   . Restless leg   . GERD (gastroesophageal reflux disease)   . Brain tumor    BP 138/77  Pulse 98  Resp 14  Ht 5\' 7"  (1.702 m)  Wt 206 lb (93.441 kg)  BMI 32.26 kg/m2  SpO2 96%  Opioid Risk Score:   Fall Risk Score: Moderate Fall Risk (6-13  points) (pt educated and given brochure on fall risk)   Review of Systems  Cardiovascular: Positive for leg swelling.  Musculoskeletal: Positive for gait problem.  All other systems reviewed and are negative.       Objective:   Physical Exam  Motor strength is 4/5 in the left deltoid, bicep, tricep, grip 5/5 in the right deltoid, bicep, tricep, grip Decreased finger to thumb opposition on the left side Positive dysdiadochokinesis on the left side  Ambulates without assistive device indoors no evidence of toe drag or knee instability. There's a wide base of support Visual fields are intact the confrontational testing.  bilateral 2+ pitting edema   Hyperactive reflexes on the left side, clonus at the left ankle     Assessment & Plan:  1. Right frontal parietal meningioma status post resection with residual left hemiparesis mainly affecting the upper. Also has some mild to moderate balance deficits. Recommend continue outpatient PT OT.finishing visits 2. Left knee osteoarthritis partially responding  to voltaren gel. Pt requests Left knee injection        Knee injection   Indication:Left Knee pain not relieved by medication management and other conservative care.  Informed consent was obtained after describing risks and benefits of the procedure with the patient, this includes bleeding, bruising, infection and medication side effects. The patient wishes to proceed and has given written consent. The patient was placed in a recumbent position. The medial aspect of the knee was marked and prepped with Betadine and alcohol. It was then entered with a 25-gauge 1-1/2 inch needle and 1 mL of 1% lidocaine was  injected into the skin and subcutaneous tissue. Then another 21 g needle was inserted into the knee joint. After negative draw back for 5cc clear yellow joint fluid, a solution containing one ML of 6mg  per mL betamethasone and 4 mL of 1% lidocaine were injected. The patient tolerated the procedure well. Post procedure instructions were given.

## 2013-12-23 NOTE — Patient Instructions (Signed)
Knee Injection Joint injections are shots. Your caregiver will place a needle into your knee joint. The needle is used to put medicine into the joint. These shots can be used to help treat different painful knee conditions such as osteoarthritis, bursitis, local flare-ups of rheumatoid arthritis, and pseudogout. Anti-inflammatory medicines such as corticosteroids and anesthetics are the most common medicines used for joint and soft tissue injections.  PROCEDURE  The skin over the kneecap will be cleaned with an antiseptic solution.  Your caregiver will inject a small amount of a local anesthetic (a medicine like Novocaine) just under the skin in the area that was cleaned.  After the area becomes numb, a second injection is done. This second injection usually includes an anesthetic and an anti-inflammatory medicine called a steroid or cortisone. The needle is carefully placed in between the kneecap and the knee, and the medicine is injected into the joint space.  After the injection is done, the needle is removed. Your caregiver may place a bandage over the injection site. The whole procedure takes no more than a couple of minutes. BEFORE THE PROCEDURE  Wash all of the skin around the entire knee area. Try to remove any loose, scaling skin. There is no other specific preparation necessary unless advised otherwise by your caregiver. LET YOUR CAREGIVER KNOW ABOUT:   Allergies.  Medications taken including herbs, eye drops, over the counter medications, and creams.  Use of steroids (by mouth or creams).  Possible pregnancy, if applicable.  Previous problems with anesthetics or Novocaine.  History of blood clots (thrombophlebitis).  History of bleeding or blood problems.  Previous surgery.  Other health problems. RISKS AND COMPLICATIONS Side effects from cortisone shots are rare. They include:   Slight bruising of the skin.  Shrinkage of the normal fatty tissue under the skin where  the shot was given.  Increase in pain after the shot.  Infection.  Weakening of tendons or tendon rupture.  Allergic reaction to the medicine.  Diabetics may have a temporary increase in their blood sugar after a shot.  Cortisone can temporarily weaken the immune system. While receiving these shots, you should not get certain vaccines. Also, avoid contact with anyone who has chickenpox or measles. Especially if you have never had these diseases or have not been previously immunized. Your immune system may not be strong enough to fight off the infection while the cortisone is in your system. AFTER THE PROCEDURE   You can go home after the procedure.  You may need to put ice on the joint 15-20 minutes every 3 or 4 hours until the pain goes away.  You may need to put an elastic bandage on the joint. HOME CARE INSTRUCTIONS   Only take over-the-counter or prescription medicines for pain, discomfort, or fever as directed by your caregiver.  You should avoid stressing the joint. Unless advised otherwise, avoid activities that put a lot of pressure on a knee joint, such as:  Jogging.  Bicycling.  Recreational climbing.  Hiking.  Laying down and elevating the leg/knee above the level of your heart can help to minimize swelling. SEEK MEDICAL CARE IF:   You have repeated or worsening swelling.  There is drainage from the puncture area.  You develop red streaking that extends above or below the site where the needle was inserted. SEEK IMMEDIATE MEDICAL CARE IF:   You develop a fever.  You have pain that gets worse even though you are taking pain medicine.  The area is   red and warm, and you have trouble moving the joint. MAKE SURE YOU:   Understand these instructions.  Will watch your condition.  Will get help right away if you are not doing well or get worse. Document Released: 11/27/2006 Document Revised: 11/28/2011 Document Reviewed: 08/24/2007 [employer] Patient  Information 2014 [employer].  

## 2013-12-24 ENCOUNTER — Ambulatory Visit: Payer: Medicare Other

## 2013-12-26 ENCOUNTER — Ambulatory Visit: Payer: Medicare Other

## 2013-12-30 ENCOUNTER — Other Ambulatory Visit: Payer: Self-pay | Admitting: Dermatology

## 2013-12-31 ENCOUNTER — Ambulatory Visit: Payer: Medicare Other | Admitting: Occupational Therapy

## 2014-01-02 ENCOUNTER — Ambulatory Visit: Payer: Medicare Other | Admitting: Occupational Therapy

## 2014-02-14 ENCOUNTER — Ambulatory Visit: Payer: Medicare Other | Attending: Physical Medicine & Rehabilitation

## 2014-02-14 DIAGNOSIS — IMO0001 Reserved for inherently not codable concepts without codable children: Secondary | ICD-10-CM | POA: Insufficient documentation

## 2014-02-14 DIAGNOSIS — R4189 Other symptoms and signs involving cognitive functions and awareness: Secondary | ICD-10-CM | POA: Insufficient documentation

## 2014-02-14 DIAGNOSIS — R279 Unspecified lack of coordination: Secondary | ICD-10-CM | POA: Insufficient documentation

## 2014-02-14 DIAGNOSIS — M6281 Muscle weakness (generalized): Secondary | ICD-10-CM | POA: Insufficient documentation

## 2014-02-18 ENCOUNTER — Ambulatory Visit: Payer: Medicare Other | Attending: Physical Medicine & Rehabilitation | Admitting: Rehabilitation

## 2014-02-18 DIAGNOSIS — IMO0001 Reserved for inherently not codable concepts without codable children: Secondary | ICD-10-CM | POA: Insufficient documentation

## 2014-02-18 DIAGNOSIS — R4189 Other symptoms and signs involving cognitive functions and awareness: Secondary | ICD-10-CM | POA: Insufficient documentation

## 2014-02-18 DIAGNOSIS — R279 Unspecified lack of coordination: Secondary | ICD-10-CM | POA: Insufficient documentation

## 2014-02-18 DIAGNOSIS — M6281 Muscle weakness (generalized): Secondary | ICD-10-CM | POA: Insufficient documentation

## 2014-02-20 ENCOUNTER — Ambulatory Visit: Payer: Medicare Other | Admitting: Physical Therapy

## 2014-02-25 ENCOUNTER — Ambulatory Visit: Payer: Medicare Other

## 2014-02-27 ENCOUNTER — Ambulatory Visit: Payer: Medicare Other

## 2014-03-04 ENCOUNTER — Ambulatory Visit: Payer: Medicare Other | Admitting: Rehabilitation

## 2014-03-06 ENCOUNTER — Encounter: Payer: Medicare Other | Admitting: Rehabilitation

## 2014-03-11 ENCOUNTER — Ambulatory Visit: Payer: Medicare Other | Admitting: Rehabilitation

## 2014-03-13 ENCOUNTER — Ambulatory Visit: Payer: Medicare Other | Admitting: Rehabilitation

## 2014-03-18 ENCOUNTER — Ambulatory Visit: Payer: Medicare Other | Admitting: Physical Therapy

## 2014-03-20 ENCOUNTER — Encounter: Payer: Medicare Other | Admitting: Rehabilitation

## 2014-04-08 ENCOUNTER — Other Ambulatory Visit: Payer: Self-pay | Admitting: Radiation Therapy

## 2014-04-08 DIAGNOSIS — D320 Benign neoplasm of cerebral meninges: Secondary | ICD-10-CM

## 2014-04-21 ENCOUNTER — Ambulatory Visit: Payer: Medicare Other | Admitting: Radiation Oncology

## 2014-04-21 ENCOUNTER — Ambulatory Visit: Payer: Medicare Other

## 2014-04-30 ENCOUNTER — Encounter: Payer: Self-pay | Admitting: Radiation Oncology

## 2014-04-30 NOTE — Progress Notes (Signed)
Location/Histology of Brain Tumor: right frontoparietal meningioma  Patient presented with symptoms of:  Left sided weakness, gait problems and falls  Past or anticipated interventions, if any, per neurosurgery: yes, stereotactic right frontoparietal craniotomy   Past or anticipated interventions, if any, per medical oncology: no  Dose of Decadron, if applicable: yes, taper ?  Recent neurologic symptoms, if any:   Seizures: no  Headaches: no  Nausea: no  Dizziness/ataxia: no  Difficulty with hand coordination: no  Focal numbness/weakness: yes, left sided weakness, numbness, difficulty walking, falls  Visual deficits/changes: no  Confusion/Memory deficits: no  Painful bone metastases at present, if any: no  SAFETY ISSUES:  Prior radiation? no  Pacemaker/ICD? no  Possible current pregnancy? no  Is the patient on methotrexate? no  Additional Complaints / other details: 80 year old male. Married Retired. Two children.

## 2014-05-05 ENCOUNTER — Other Ambulatory Visit: Payer: Self-pay | Admitting: Radiation Therapy

## 2014-05-05 ENCOUNTER — Ambulatory Visit
Admission: RE | Admit: 2014-05-05 | Discharge: 2014-05-05 | Disposition: A | Discharge status: Home / Self Care | Payer: Medicare Other | Source: Ambulatory Visit | Attending: Radiation Oncology | Admitting: Radiation Oncology

## 2014-05-05 ENCOUNTER — Ambulatory Visit: Payer: Medicare Other

## 2014-05-05 ENCOUNTER — Encounter: Payer: Self-pay | Admitting: Radiation Oncology

## 2014-05-05 VITALS — BP 126/87 | HR 85 | Resp 16 | Ht 68.0 in | Wt 210.0 lb

## 2014-05-05 DIAGNOSIS — Z791 Long term (current) use of non-steroidal anti-inflammatories (NSAID): Secondary | ICD-10-CM | POA: Diagnosis not present

## 2014-05-05 DIAGNOSIS — Z79899 Other long term (current) drug therapy: Secondary | ICD-10-CM | POA: Diagnosis not present

## 2014-05-05 DIAGNOSIS — Z8546 Personal history of malignant neoplasm of prostate: Secondary | ICD-10-CM | POA: Diagnosis not present

## 2014-05-05 DIAGNOSIS — Z923 Personal history of irradiation: Secondary | ICD-10-CM | POA: Diagnosis not present

## 2014-05-05 DIAGNOSIS — G2581 Restless legs syndrome: Secondary | ICD-10-CM | POA: Insufficient documentation

## 2014-05-05 DIAGNOSIS — D320 Benign neoplasm of cerebral meninges: Secondary | ICD-10-CM | POA: Diagnosis not present

## 2014-05-05 DIAGNOSIS — D329 Benign neoplasm of meninges, unspecified: Secondary | ICD-10-CM

## 2014-05-05 DIAGNOSIS — Z510 Encounter for antineoplastic radiation therapy: Secondary | ICD-10-CM | POA: Diagnosis present

## 2014-05-05 DIAGNOSIS — K219 Gastro-esophageal reflux disease without esophagitis: Secondary | ICD-10-CM | POA: Insufficient documentation

## 2014-05-05 DIAGNOSIS — E785 Hyperlipidemia, unspecified: Secondary | ICD-10-CM | POA: Diagnosis not present

## 2014-05-05 DIAGNOSIS — Z9852 Vasectomy status: Secondary | ICD-10-CM | POA: Diagnosis not present

## 2014-05-05 DIAGNOSIS — I10 Essential (primary) hypertension: Secondary | ICD-10-CM | POA: Insufficient documentation

## 2014-05-05 DIAGNOSIS — Z96659 Presence of unspecified artificial knee joint: Secondary | ICD-10-CM | POA: Insufficient documentation

## 2014-05-05 HISTORY — DX: Personal history of irradiation: Z92.3

## 2014-05-05 HISTORY — DX: Reserved for concepts with insufficient information to code with codable children: IMO0002

## 2014-05-05 LAB — BUN AND CREATININE (CC13)
BUN: 16 mg/dL (ref 7.0–26.0)
Creatinine: 1.1 mg/dL (ref 0.7–1.3)

## 2014-05-05 NOTE — Progress Notes (Signed)
See progress noted under physician encounter.  

## 2014-05-05 NOTE — Progress Notes (Signed)
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 male who underwent stereotactic right fronto-parietal craniotomy for res meningioma of the right frontoparietal convexity. Meningioma of the right frontoparietal convexity.ection of meningioma on 09/27/13.   Pathology showed grade II meningioma. Post-op MRI showed complete resection.    03/19/14 MRI shows recurrent disease.     PREVIOUS RADIATION THERAPY: Yes prostate radiotherapy with Dr. Valere Dross 5 years ago.  PAST MEDICAL HISTORY:  has a past medical history of Anemia; Prostate cancer; Basal cell cancer; Hyperlipidemia; Restless leg; GERD (gastroesophageal reflux disease); Brain tumor; History of radiation therapy; Hypertension; and Herniated disc.    PAST SURGICAL HISTORY: Past Surgical History  Procedure Laterality Date  . Joint replacement      right knee replacement  . Bilateral cateract surgery    . Vasectomy    . Tonsillectomy    . Colonoscopy      colon polyp removed  . Craniotomy Right 09/27/2013    Procedure: RIGHT FRONTAL CRANIOTOMY FOR TUMOR RESECTION ;  Surgeon: Consuella Lose, MD;  Location: [facility];  Service: Neurosurgery;  Laterality: Right;  . Prostate biopsy      FAMILY HISTORY: family history includes Dementia in his father; Parkinsonism in his mother.  SOCIAL HISTORY:  reports that he has never smoked. He quit smokeless tobacco use about 40 years ago. His smokeless tobacco use included Chew. He reports that he does not drink alcohol or use illicit drugs.  ALLERGIES: Asa and Vicodin  MEDICATIONS:  Current Outpatient Prescriptions    Medication Sig Dispense Refill  . beta carotene w/minerals (OCUVITE) tablet Take 1 tablet by mouth daily.  30 tablet  1  . diclofenac sodium (VOLTAREN) 1 % GEL Apply 2 g topically 4 (four) times daily.  3 Tube  1  . ferrous sulfate 325 (65 FE) MG EC tablet Take 1 tablet (325 mg total) by mouth daily with breakfast.  30 tablet  3  . Omega-3 Fatty Acids (FISH OIL) 1000 MG CAPS Take 1,000 mg by mouth every morning.      Marland Kitchen omeprazole (PRILOSEC) 20 MG capsule Take 1 capsule (20 mg total) by mouth daily.  30 capsule  1  . Probiotic Product (PROBIOTIC DAILY PO) Take by mouth.      Marland Kitchen rOPINIRole (REQUIP) 1 MG tablet Take 1 tablet (1 mg total) by mouth at bedtime.  60 tablet  1  . simvastatin (ZOCOR) 40 MG tablet Take 1 tablet (40 mg total) by mouth every evening.  30 tablet  1  . solifenacin (VESICARE) 5 MG tablet Take 1 tablet (5 mg total) by mouth daily.  30 tablet  1  . tamsulosin (FLOMAX) 0.4 MG CAPS capsule Take 1 capsule (0.4 mg total) by mouth daily after supper.  30 capsule  1  . valsartan (DIOVAN) 160 MG tablet       . vitamin B-12 (CYANOCOBALAMIN) 100 MCG tablet Take 300 mcg by mouth daily.       . furosemide (LASIX) 20 MG tablet       . levETIRAcetam (KEPPRA) 500 MG tablet Take 1 tablet (500 mg total) by mouth  2 (two) times daily.  60 tablet  1   No current facility-administered medications for this encounter.    REVIEW OF SYSTEMS:  A 15 point review of systems is documented in the electronic medical record. This was obtained by the nursing staff. However, I reviewed this with the patient to discuss relevant findings and make appropriate changes.  Pertinent items are noted in HPI.  He has some subjective weakness on the left.   PHYSICAL EXAM:  height is 5\' 8"  (1.727 m) and weight is 210 lb (95.255 kg). His blood pressure is 126/87 and his pulse is 85. His respiration is 16.   The patient is in no acute distress today. Is alert and oriented. His head is normocephalic and atraumatic with  normal hair pattern. Extraocular muscles are intact speech is fluent articulate. Motor strength is 5/5 in the upper lower extremities. Gait is unremarkable. Mood and affect are appropriate.  KPS = 90  100 - Normal; no complaints; no evidence of disease. 90   - Able to carry on normal activity; minor signs or symptoms of disease. 80   - Normal activity with effort; some signs or symptoms of disease. 73   - Cares for self; unable to carry on normal activity or to do active work. 60   - Requires occasional assistance, but is able to care for most of his personal needs. 50   - Requires considerable assistance and frequent medical care. 25   - Disabled; requires special care and assistance. 46   - Severely disabled; hospital admission is indicated although death not imminent. 81   - Very sick; hospital admission necessary; active supportive treatment necessary. 10   - Moribund; fatal processes progressing rapidly. 0     - Dead  Karnofsky DA, Abelmann Bettles, Craver LS and Burchenal [facility] [phone]) The use of the nitrogen mustards in the palliative treatment of carcinoma: with particular reference to bronchogenic carcinoma Cancer 1 634-56  LABORATORY DATA:  Lab Results  Component Value Date   WBC 13.4* 10/04/2013   HGB 10.0* 10/04/2013   HCT 30.1* 10/04/2013   MCV 94.1 10/04/2013   PLT 288 10/04/2013   Lab Results  Component Value Date   NA 136* 10/04/2013   K 4.3 10/04/2013   CL 99 10/04/2013   CO2 24 10/04/2013   Lab Results  Component Value Date   ALT 59* 10/04/2013   AST 20 10/04/2013   ALKPHOS 56 10/04/2013   BILITOT 0.3 10/04/2013     RADIOGRAPHY: No results found.    IMPRESSION: Mr. [name] is a very nice 80 year old gentleman with recurrent grade 2 meningioma of the right frontoparietal convexity. He demonstrates a discrete recurrent tumor within 6 months of surgical resection. He may benefit from stereotactic radiosurgery to the site of recurrence for salvage.   PLAN:Today, I talked to the  patient and family about the findings and work-up thus far.  We discussed the natural history of  recurrent meningioma  and general treatment, highlighting the role or radiotherapyand stereotactic radiosurgery  in the management.  We discussed the available radiation techniques, and focused on the details of logistics and delivery.  We reviewed the anticipated acute and late sequelae associated with radiation in this setting.  The patient was encouraged to ask questions that I answered to the best of my ability.  The patient would like to proceed with radiation and will be scheduled for CT simulation.  I spent 60 minutes minutes face to face with the patient and  more than 50% of that time was spent in counseling and/or coordination of care.    ------------------------------------------------  Sheral Apley. Tammi Klippel, M.D.

## 2014-05-05 NOTE — Progress Notes (Signed)
Patient here today with wife to discuss recurrent meningioma with Dr. Tammi Klippel. Patient's original surgery done January 9th but, sixth month MRI showed reoccurrence. Patient is of the understanding it has grown back to the size of 1 inch. Reports left side numbness and dragging of his left foot is slowly coming back as well "like it was prior to surgery." Patient ambulated with the assistance of a cane today. Patient reports prior radiation therapy to prostate five years ago under the care of Dr. Valere Dross.  Reports he woke up this morning with a headache for which he took Tylenol. Wife states, " he never has headaches." Reports chronic ringing in his left ear. Denies dizziness, nausea or vomiting. Denies diplopia. Denies taking decadron or keppra at this time. Denies seizure activity.

## 2014-05-05 NOTE — Progress Notes (Signed)
Ambulates with assistance of cane.

## 2014-05-06 ENCOUNTER — Other Ambulatory Visit: Payer: Self-pay | Admitting: Radiation Therapy

## 2014-05-06 DIAGNOSIS — D320 Benign neoplasm of cerebral meninges: Secondary | ICD-10-CM

## 2014-05-08 ENCOUNTER — Ambulatory Visit
Admission: RE | Admit: 2014-05-08 | Discharge: 2014-05-08 | Disposition: A | Discharge status: Home / Self Care | Payer: Medicare Other | Source: Ambulatory Visit | Attending: Radiation Oncology | Admitting: Radiation Oncology

## 2014-05-08 DIAGNOSIS — D320 Benign neoplasm of cerebral meninges: Secondary | ICD-10-CM

## 2014-05-08 MED ORDER — GADOBENATE DIMEGLUMINE 529 MG/ML IV SOLN
20.0000 mL | Freq: Once | INTRAVENOUS | Status: AC | PRN
  Administered 2014-05-08: 20 mL via INTRAVENOUS

## 2014-05-09 ENCOUNTER — Encounter: Payer: Self-pay | Admitting: Radiation Oncology

## 2014-05-09 ENCOUNTER — Ambulatory Visit
Admission: RE | Admit: 2014-05-09 | Discharge: 2014-05-09 | Disposition: A | Discharge status: Home / Self Care | Payer: Medicare Other | Source: Ambulatory Visit | Attending: Radiation Oncology | Admitting: Radiation Oncology

## 2014-05-09 VITALS — BP 149/78 | HR 92 | Temp 97.8°F | Resp 20 | Wt 207.5 lb

## 2014-05-09 DIAGNOSIS — D329 Benign neoplasm of meninges, unspecified: Secondary | ICD-10-CM

## 2014-05-09 DIAGNOSIS — Z510 Encounter for antineoplastic radiation therapy: Secondary | ICD-10-CM | POA: Diagnosis not present

## 2014-05-09 MED ORDER — SODIUM CHLORIDE 0.9 % IJ SOLN
10.0000 mL | Freq: Once | INTRAMUSCULAR | Status: AC
  Administered 2014-05-09: 10 mL via INTRAVENOUS

## 2014-05-09 NOTE — Progress Notes (Signed)
Received patient and his wife in nursing. Started right AC 22 gauge IV in the right AC on the first attempt. Excellent blood return. Catheter flushed without complication. Patient tolerated well. Secured IV in place. Patient escorted to CT for simulation by therapist, Colletta Maryland.

## 2014-05-09 NOTE — Progress Notes (Signed)
Gaspar Garbe, RN reports removing patient's right AC IV. She reports catheter was intact upon removal. She reports applying a two by two gauze with paper tape to old IV site.

## 2014-05-09 NOTE — Progress Notes (Signed)
  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  Identity was confirmed.  All relevant records and images related to the planned course of therapy were reviewed.  The patient freely provided informed written consent to proceed with treatment after reviewing the details related to the planned course of therapy. The consent form was witnessed and verified by the simulation staff. Intravenous access was established for contrast administration. Then, the patient was set-up in a stable reproducible supine position for radiation therapy.  A relocatable thermoplastic stereotactic head frame was fabricated for precise immobilization.  CT images were obtained.  Surface markings were placed.  The CT images were loaded into the planning software and fused with the patient's targeting MRI scan.  Then the target and avoidance structures were contoured.  Treatment planning then occurred.  The radiation prescription was entered and confirmed.  I have requested 3D planning  I have requested a DVH of the following structures: Brain stem, brain, left eye, right eye, lenses, optic chiasm, target volumes, uninvolved brain, and normal tissue.    PLAN:  The patient will receive 25 Gy in 5 fractions.  ________________________________  Sheral Apley Tammi Klippel, M.D.

## 2014-05-12 DIAGNOSIS — Z510 Encounter for antineoplastic radiation therapy: Secondary | ICD-10-CM | POA: Diagnosis not present

## 2014-05-15 ENCOUNTER — Ambulatory Visit
Admission: RE | Admit: 2014-05-15 | Discharge: 2014-05-15 | Disposition: A | Discharge status: Home / Self Care | Payer: Medicare Other | Source: Ambulatory Visit | Attending: Radiation Oncology | Admitting: Radiation Oncology

## 2014-05-15 ENCOUNTER — Encounter: Payer: Self-pay | Admitting: Radiation Oncology

## 2014-05-15 VITALS — BP 146/76 | HR 80 | Temp 97.9°F | Resp 20

## 2014-05-15 DIAGNOSIS — Z510 Encounter for antineoplastic radiation therapy: Secondary | ICD-10-CM | POA: Diagnosis not present

## 2014-05-15 DIAGNOSIS — D329 Benign neoplasm of meninges, unspecified: Secondary | ICD-10-CM

## 2014-05-15 NOTE — Progress Notes (Signed)
Patient denys any c/o ,okay per Dr.manning for patient to go home, advised patient to go home and rest, no working,  Or driving, ,escorted patient to front lobby 1st floor  Wife accompying patient to car,patient twith slow slow gait with cane,patient to call for any fevers, vomiting, vision changes not normal for him,verbal understanding  1:56 PM

## 2014-05-15 NOTE — Progress Notes (Signed)
  Radiation Oncology         [phone] ________________________________  Stereotactic Treatment Procedure Note  Name: TEDRIC LEETH MRN: [id number]  Date: 05/15/2014  DOB: 04-Sep-1931  SPECIAL TREATMENT PROCEDURE  CURRENT FRACTION:  1  PLANNED FRACTIONS:  5  3D TREATMENT PLANNING AND DOSIMETRY:  The patient's radiation plan was reviewed and approved by neurosurgery and radiation oncology prior to treatment.  It showed 3-dimensional radiation distributions overlaid onto the planning CT/MRI image set.  The [facility] for the target structures as well as the organs at risk were reviewed. The documentation of the 3D plan and dosimetry are filed in the radiation oncology EMR.  NARRATIVE:  NICHOLIS STEPANEK was brought to the TrueBeam stereotactic radiation treatment machine and placed supine on the CT couch. The head frame was applied, and the patient was set up for stereotactic radiosurgery.  Neurosurgery was present for the set-up and delivery  SIMULATION VERIFICATION:  In the couch zero-angle position, the patient underwent Exactrac imaging using the Brainlab system with orthogonal KV images.  These were carefully aligned and repeated to confirm treatment position for each of the isocenters.  The Exactrac snap film verification was repeated at each couch angle.  SPECIAL TREATMENT PROCEDURE: [name] received stereotactic radiosurgery to the following targets: Right fronto-parietal meningioma target was treated using 3 Rapid Arc VMAT Beams to a fractional prescription dose of 5 Gy with a plan for a total of 5 fractions to 25 Gy.  ExacTrac registration was performed for each couch angle.  The 100% isodose line was prescribed.  STEREOTACTIC TREATMENT MANAGEMENT:  Following delivery, the patient was transported to nursing in stable condition and monitored for possible acute effects.  Vital signs were recorded BP 146/76  Pulse 80  Temp(Src) 97.9 F (36.6 C) (Oral)  Resp 20  SpO2 99%. The patient  tolerated treatment without significant acute effects, and was discharged to home in stable condition.    PLAN: Complete five planned fractions, and then follow-up in one month.  ________________________________  Sheral Apley. Tammi Klippel, M.D.

## 2014-05-15 NOTE — Progress Notes (Signed)
S/p SRS brain, patient alert,oriented x3, no c/o blurred vision, nausea, dizzy ness, no pain no headache"everythng is fine so far'" sted patient, continue to monitor patient for 20 minutes per mD , offered fluids, patient declined,patebnt has a cane and yeloow fall bracelet on right wrist, wife upstairs stated patient 1:29 PM

## 2014-05-15 NOTE — Op Note (Signed)
Name: [name]    MRN: [id number]   Date: 05/15/2014    DOB: 11-18-1930   STEREOTACTIC RADIOSURGERY OPERATIVE NOTE  PRE-OPERATIVE DIAGNOSIS:  Recurrent Left parietal meningioma  POST-OPERATIVE DIAGNOSIS:  Same  PROCEDURE:  Stereotactic Radiosurgery  SURGEON:  Consuella Lose, MD  RADIATION ONCOLOGIST: Dr. Tyler Pita, MD  TECHNIQUE:  The patient underwent a radiation treatment planning session in the radiation oncology simulation suite under the care of the radiation oncology physician and physicist.  I participated closely in the radiation treatment planning afterwards. The patient underwent planning CT which was fused to 3T high resolution MRI with 1 mm axial slices.  These images were fused on the planning system.  Radiation oncology contoured the gross target volume and subsequently expanded this to yield the Planning Target Volume. I actively participated in the planning process.  I helped to define and review the target contours and also the contours of the optic pathway, eyes, brainstem and selected nearby organs at risk.  All the dose constraints for critical structures were reviewed and compared to AAPM Task Group 101.  The prescription dose conformity was reviewed.  I approved the plan electronically.    Accordingly, [name] was brought to the TrueBeam stereotactic radiation treatment linac and placed in the custom immobilization mask.  The patient was aligned according to the IR fiducial markers with BrainLab Exactrac, then orthogonal x-rays were used in ExacTrac with the 6DOF robotic table and the shifts were made to align the patient  This lesion was complex because it was >3.5cm.  [name] received the first of 5 planned fractions of stereotactic radiosurgery uneventfully to a prescription dose of 5Gy.  The detailed description of the procedure is recorded in the radiation oncology procedure note.  I was present for the duration of the procedure.  DISPOSITION:   Following delivery, the patient was transported to nursing in stable condition and monitored for possible acute effects to be discharged to home in stable condition with follow-up in one month.  Consuella Lose, MD [facility]

## 2014-05-19 ENCOUNTER — Ambulatory Visit
Admission: RE | Admit: 2014-05-19 | Discharge: 2014-05-19 | Disposition: A | Discharge status: Home / Self Care | Payer: Medicare Other | Source: Ambulatory Visit | Attending: Radiation Oncology | Admitting: Radiation Oncology

## 2014-05-19 VITALS — BP 145/87 | HR 89 | Temp 97.8°F | Resp 18

## 2014-05-19 DIAGNOSIS — Z510 Encounter for antineoplastic radiation therapy: Secondary | ICD-10-CM | POA: Diagnosis not present

## 2014-05-19 DIAGNOSIS — D329 Benign neoplasm of meninges, unspecified: Secondary | ICD-10-CM

## 2014-05-19 NOTE — Progress Notes (Signed)
  Radiation Oncology [phone]) [phone]  ________________________________  Stereotactic Treatment Procedure Note   Name: [name] MRN: [id number]   Date: 05/19/2014 DOB: 1930-10-17    SPECIAL TREATMENT PROCEDURE  CURRENT FRACTION: 2  PLANNED FRACTIONS: 5   3D TREATMENT PLANNING AND DOSIMETRY: The patient's radiation plan was reviewed and approved by neurosurgery and radiation oncology prior to treatment. It showed 3-dimensional radiation distributions overlaid onto the planning CT/MRI image set. The [facility] for the target structures as well as the organs at risk were reviewed. The documentation of the 3D plan and dosimetry are filed in the radiation oncology EMR.   NARRATIVE: [name] was brought to the TrueBeam stereotactic radiation treatment machine and placed supine on the CT couch. The head frame was applied, and the patient was set up for stereotactic radiosurgery.   SIMULATION VERIFICATION: In the couch zero-angle position, the patient underwent Exactrac imaging using the Brainlab system with orthogonal KV images. These were carefully aligned and repeated to confirm treatment position for each of the isocenters. The Exactrac snap film verification was repeated at each couch angle.   SPECIAL TREATMENT PROCEDURE: [name] received stereotactic radiosurgery to the following targets:  Right fronto-parietal meningioma target was treated using 3 Rapid Arc VMAT Beams to a fractional prescription dose of 5 Gy with a plan for a total of 5 fractions to 25 Gy. ExacTrac registration was performed for each couch angle. The 100% isodose line was prescribed.   STEREOTACTIC TREATMENT MANAGEMENT: Following delivery, the patient was transported to nursing in stable condition and monitored for possible acute effects. The patient tolerated treatment without significant acute effects, and was discharged to home in stable condition.   PLAN: Complete five planned fractions, and then follow-up in one  month.

## 2014-05-19 NOTE — Addendum Note (Signed)
Encounter addended by: Arlyss Repress, RN on: 05/19/2014  3:51 PM<BR>     Documentation filed: Notes Section

## 2014-05-19 NOTE — Progress Notes (Signed)
Patient for vitals and routine assessment post treatment #2 of SRS to brain.denies pain or nausea at present but did state he has an intermittent headache "2" resolved with tylenol.Knows to call if it worsens prior to scheduled treatment on 05/21/14.told him to take it easy today and to push po fluids.vitals stable.Ambulated with patient to first floor with wife for discharge home.

## 2014-05-21 ENCOUNTER — Ambulatory Visit: Payer: Medicare Other | Admitting: Radiation Oncology

## 2014-05-21 ENCOUNTER — Ambulatory Visit
Admission: RE | Admit: 2014-05-21 | Discharge: 2014-05-21 | Disposition: A | Discharge status: Home / Self Care | Payer: Medicare Other | Source: Ambulatory Visit | Attending: Radiation Oncology | Admitting: Radiation Oncology

## 2014-05-21 ENCOUNTER — Encounter: Payer: Self-pay | Admitting: Radiation Oncology

## 2014-05-21 DIAGNOSIS — Z510 Encounter for antineoplastic radiation therapy: Secondary | ICD-10-CM | POA: Diagnosis not present

## 2014-05-21 NOTE — Progress Notes (Signed)
[name] brought to room 3 at nursing, s/p SRS brain,3/5 compleetd no c/o  Pain,nausea, vision changes,or dizzy ness, per Dr.Moody, after vitals, patient can be d/c,escorted patient o lobby,. He has steady gait with cane and wife at side, will call for any symtpoms not normal for him, increased h/a,fever,dizzyness, blurred vision, will go home and rest the rest of today stated patient 1:03 PM

## 2014-05-23 ENCOUNTER — Ambulatory Visit
Admission: RE | Admit: 2014-05-23 | Discharge: 2014-05-23 | Disposition: A | Discharge status: Home / Self Care | Payer: Medicare Other | Source: Ambulatory Visit | Attending: Radiation Oncology | Admitting: Radiation Oncology

## 2014-05-23 VITALS — BP 145/83 | HR 83 | Temp 97.9°F | Resp 16

## 2014-05-23 DIAGNOSIS — G9389 Other specified disorders of brain: Secondary | ICD-10-CM

## 2014-05-23 DIAGNOSIS — Z510 Encounter for antineoplastic radiation therapy: Secondary | ICD-10-CM | POA: Diagnosis not present

## 2014-05-23 NOTE — Progress Notes (Signed)
Patient presented to the clinic today following 4/5 srs treatment. Patient denies pain, nausea, vomiting, dizziness, vision changes or ringing in the ears. Patient denies taking decadron. Denies seizure activity. Complains today of more frequent and intense headaches. Reports when these headaches present he takes a tylenol but, the headache returns within an hour. Informed Dr. Tammi Klippel of these findings.

## 2014-05-26 NOTE — Progress Notes (Signed)
  Radiation Oncology         [phone] ________________________________  Stereotactic Treatment Procedure Note  Name: [name] MRN: [id number]  Date: 05/23/2014  DOB: February 21, 1931  SPECIAL TREATMENT PROCEDURE  CURRENT FRACTION:   4   PLANNED FRACTIONS: 5   3D TREATMENT PLANNING AND DOSIMETRY: The patient's radiation plan was reviewed and approved by neurosurgery and radiation oncology prior to treatment. It showed 3-dimensional radiation distributions overlaid onto the planning CT/MRI image set. The [facility] for the target structures as well as the organs at risk were reviewed. The documentation of the 3D plan and dosimetry are filed in the radiation oncology EMR.   NARRATIVE: [name] was brought to the TrueBeam stereotactic radiation treatment machine and placed supine on the CT couch. The head frame was applied, and the patient was set up for stereotactic radiosurgery. Neurosurgery was present for the set-up and delivery   SIMULATION VERIFICATION: In the couch zero-angle position, the patient underwent Exactrac imaging using the Brainlab system with orthogonal KV images. These were carefully aligned and repeated to confirm treatment position for each of the isocenters. The Exactrac snap film verification was repeated at each couch angle.   SPECIAL TREATMENT PROCEDURE: [name] received stereotactic radiosurgery to the following targets:  Right fronto-parietal meningioma target was treated using 3 Rapid Arc VMAT Beams to a fractional prescription dose of 5 Gy with a plan for a total of 5 fractions to 25 Gy. ExacTrac registration was performed for each couch angle. The 100% isodose line was prescribed.   STEREOTACTIC TREATMENT MANAGEMENT:  Following delivery, the patient was transported to nursing in stable condition and monitored for possible acute effects.  Vital signs were recorded BP 145/83  Pulse 83  Temp(Src) 97.9 F (36.6 C) (Oral)  Resp 16  SpO2 95%. The patient tolerated  treatment without significant acute effects, and was discharged to home in stable condition.    PLAN: Complete five planned fractions, and then follow-up in one month.   ________________________________  Sheral Apley. Tammi Klippel, M.D.

## 2014-05-27 ENCOUNTER — Telehealth: Payer: Self-pay | Admitting: Radiation Oncology

## 2014-05-27 MED ORDER — DEXAMETHASONE 4 MG PO TABS: 2.0000 mg | ORAL_TABLET | Freq: Two times a day (BID) | ORAL | Status: DC

## 2014-05-27 NOTE — Addendum Note (Signed)
Encounter addended by: Lora Paula, MD on: 05/27/2014  1:20 PM<BR>     Documentation filed: Orders

## 2014-05-27 NOTE — Telephone Encounter (Signed)
Phoned patient to make him aware decadron script had been escribed. Patient states, "I am here at the pharmacy picking it up now." Advised about potential side effects associated with radiation therapy and he verbalized understanding.

## 2014-05-27 NOTE — Telephone Encounter (Signed)
Patient left a message over the holiday weekend. He reports that Dr. Tammi Klippel was to call a steroid script following his last treatment. He states, "I went to [facility] on [address] and they didn't have anything." Explained to the patient this Probation officer would check with Dr. Tammi Klippel to see if there is a script to call in. Patient understands that this writer will call back once this matter is handled.

## 2014-05-28 ENCOUNTER — Ambulatory Visit: Payer: Medicare Other | Admitting: Radiation Oncology

## 2014-05-29 ENCOUNTER — Encounter: Payer: Self-pay | Admitting: Radiation Oncology

## 2014-05-29 ENCOUNTER — Ambulatory Visit
Admission: RE | Admit: 2014-05-29 | Discharge: 2014-05-29 | Disposition: A | Discharge status: Home / Self Care | Payer: Medicare Other | Source: Ambulatory Visit | Attending: Radiation Oncology | Admitting: Radiation Oncology

## 2014-05-29 DIAGNOSIS — Z510 Encounter for antineoplastic radiation therapy: Secondary | ICD-10-CM | POA: Diagnosis not present

## 2014-05-29 DIAGNOSIS — D329 Benign neoplasm of meninges, unspecified: Secondary | ICD-10-CM

## 2014-05-29 NOTE — Progress Notes (Signed)
  Radiation Oncology         [phone] ________________________________  Stereotactic Treatment Procedure Note  Name: [name] MRN: [id number]  Date: 05/29/2014  DOB: 01/19/1931  SPECIAL TREATMENT PROCEDURE  CURRENT FRACTION:  5  PLANNED FRACTIONS: 5   3D TREATMENT PLANNING AND DOSIMETRY: The patient's radiation plan was reviewed and approved by neurosurgery and radiation oncology prior to treatment. It showed 3-dimensional radiation distributions overlaid onto the planning CT/MRI image set. The [facility] for the target structures as well as the organs at risk were reviewed. The documentation of the 3D plan and dosimetry are filed in the radiation oncology EMR.   NARRATIVE: [name] was brought to the TrueBeam stereotactic radiation treatment machine and placed supine on the CT couch. The head frame was applied, and the patient was set up for stereotactic radiosurgery. Neurosurgery was present for the initial set-up and delivery   SIMULATION VERIFICATION: In the couch zero-angle position, the patient underwent Exactrac imaging using the Brainlab system with orthogonal KV images. These were carefully aligned and repeated to confirm treatment position for each of the isocenters. The Exactrac snap film verification was repeated at each couch angle.   SPECIAL TREATMENT PROCEDURE: [name] received stereotactic radiosurgery to the following targets:  Right fronto-parietal meningioma target was treated using 3 Rapid Arc VMAT Beams to a fractional prescription dose of 5 Gy with a plan for a total of 5 fractions to 25 Gy. ExacTrac registration was performed for each couch angle. The 100% isodose line was prescribed.   STEREOTACTIC TREATMENT MANAGEMENT:  Following delivery, the patient was transported to nursing in stable condition and monitored for possible acute effects.  Vital signs were recorded . The patient tolerated treatment without significant acute effects, and was discharged to home  in stable condition.    PLAN: Completed five planned fractions, follow-up in one month .  ________________________________  Sheral Apley. Tammi Klippel, M.D.

## 2014-05-31 NOTE — Progress Notes (Signed)
  Radiation Oncology         [phone]) [phone] ________________________________  Name: [name] MRN: [id number]  Date: 05/29/2014  DOB: 1931/06/01   End of Treatment Note  Diagnosis:   80 yo man with recurrent grade II meningioma of the right frontoparietal convexity  Indication for treatment:  Curative Local Control       Radiation treatment dates:   05/15/2014, 05/19/2014, 05/21/2014, 05/23/2014, 05/29/2014  Site/dose/beams/energy:   Right fronto-parietal meningioma target was treated using 3 Rapid Arc VMAT Beams with 5 fractions of 5 Gy to 25 Gy. ExacTrac registration was performed for each couch angle. The 100% isodose line was prescribed.  6 MV X-rays in [facility] were used.  Narrative: The patient tolerated radiation treatment relatively well.     Plan: The patient has completed radiation treatment. The patient will return to radiation oncology clinic for routine followup in one month. I advised them to call or return sooner if they have any questions or concerns related to their recovery or treatment. ________________________________  Sheral Apley. Tammi Klippel, M.D.

## 2014-06-11 NOTE — Progress Notes (Signed)
  Radiation Oncology         [phone] ________________________________  Name: [name]  MRN: [id number]  Date: 05/21/2014  DOB: 05-25-1931  Stereotactic Radiotherapy Treatment Procedure Note  CURRENT FRACTION:    3  PLANNED FRACTIONS:  5  NARRATIVE:  [name] was brought to the stereotactic radiation treatment machine and placed supine on the CT couch. The patient was set up for stereotactic radiotherapy in the BrainLab mask.  3D TREATMENT PLANNING AND DOSIMETRY:  The patient's radiation plan was reviewed and approved prior to starting treatment. The documentation of this is filed in the radiation oncology EMR.  SIMULATION VERIFICATION:  The patient underwent ExacTrac imaging on the treatment unit.  These were carefully aligned to document that the ablative radiation dose would cover the target volume and maximally spare the nearby organs at risk according to the planned distribution.  SPECIAL TREATMENT PROCEDURE: [name] received high dose stereotactic radiotherapy to the planned target volume without unforeseen complications. Treatment was delivered uneventfully. The high doses associated with stereotactic body radiotherapy and the significant potential risks require careful treatment set up and patient monitoring constituting a special treatment procedure   STEREOTACTIC TREATMENT MANAGEMENT:  Following delivery, the patient was evaluated clinically. The patient tolerated treatment without significant acute effects, and was discharged to home in stable condition.    PLAN: Continue treatment as planned.  ________________________________  Sheral Apley. Tammi Klippel, M.D.

## 2014-06-26 ENCOUNTER — Telehealth: Payer: Self-pay | Admitting: Radiation Oncology

## 2014-06-26 NOTE — Telephone Encounter (Signed)
Received call from patient. Patient explains that he presented to his PCP on Tuesday after fives day of bilateral leg edema. Explains PCP told him the edema was a result of the decadron. That day the patient stopped taking his decadron without tapering.

## 2014-06-26 NOTE — Telephone Encounter (Signed)
Prior to Tuesday patient was taking decadron 2 mg bid. Patient reports since stopping the decadron without taper on Tuesday he experienced two episodes of shaking that began in his left arm and took over his entire body. Per Dr. Johny Shears order instructed patient to begin taking decadron 2 mg once per day until follow up appointment on Monday. Reinforced decadron isn't a medication to stop taking without advisement from a physician. Went on to explain the shaking experience was more than likely related to stopping the decadron "cold [place]." Patient verbalized understanding and expressed appreciation for the return call.

## 2014-06-29 ENCOUNTER — Encounter: Payer: Self-pay | Admitting: Radiation Oncology

## 2014-06-29 NOTE — Progress Notes (Signed)
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   Edema of both feet continue. Shortness of breath noted. Denies pain. Reports chronic ringing in left ear continues. Denies headache, dizziness, nausea, vomiting, or diplopia. Patient reports seizure activity after he stopped decadron. Denies any seizures since resuming 2 mg decadron each morning. Slow steady gait noted with assistance of a cane                            ALLERGIES:  is allergic to asa and vicodin.  Meds: Current Outpatient Prescriptions  Medication Sig Dispense Refill  . beta carotene w/minerals (OCUVITE) tablet Take 1 tablet by mouth daily.  30 tablet  1  . dexamethasone (DECADRON) 4 MG tablet Take 0.5 tablets (2 mg total) by mouth 2 (two) times daily.  30 tablet  2  . diclofenac sodium (VOLTAREN) 1 % GEL Apply 2 g topically 4 (four) times daily.  3 Tube  1  . ferrous sulfate 325 (65 FE) MG EC tablet Take 1 tablet (325 mg total) by mouth daily with breakfast.  30 tablet  3  . furosemide (LASIX) 20 MG tablet       . Omega-3 Fatty Acids (FISH OIL) 1000 MG CAPS Take 1,000 mg by mouth every morning.      Marland Kitchen omeprazole (PRILOSEC) 20 MG capsule Take 1 capsule (20 mg total) by mouth daily.  30 capsule  1  . Probiotic Product (PROBIOTIC DAILY PO) Take by mouth.      Marland Kitchen rOPINIRole (REQUIP) 1 MG tablet Take 1 tablet (1 mg total) by mouth at bedtime.  60 tablet  1  . simvastatin (ZOCOR) 40 MG tablet Take 1 tablet (40 mg total) by mouth every evening.  30 tablet   1  . solifenacin (VESICARE) 5 MG tablet Take 1 tablet (5 mg total) by mouth daily.  30 tablet  1  . tamsulosin (FLOMAX) 0.4 MG CAPS capsule Take 1 capsule (0.4 mg total) by mouth daily after supper.  30 capsule  1  . valsartan (DIOVAN) 160 MG tablet       . vitamin B-12 (CYANOCOBALAMIN) 100 MCG tablet Take 300 mcg by mouth daily.        No current facility-administered medications for this encounter.    Physical Findings: The patient is in no acute distress. Patient is alert and oriented.  weight is 222 lb (100.699 kg). His blood pressure is 133/64 and his pulse is 87. His respiration is 16. .  No significant changes.  Lab Findings: Lab Results  Component Value Date   WBC 13.4* 10/04/2013   HGB 10.0* 10/04/2013   HCT 30.1* 10/04/2013   MCV 94.1 10/04/2013   PLT 288 10/04/2013   Impression:  The patient is recovering from the effects of radiation.  He did not tolerate an abrupt discontinuation of steroids and experienced a focal motor seizure.  He is now  tapering more slowly.  Plan:  Continue dexamethasone for 2 more weeks at 2 mg daily, then stop.  MRI brain at 3 months follow-up.  _____________________________________  Sheral Apley. Tammi Klippel, M.D.

## 2014-06-30 ENCOUNTER — Encounter: Payer: Self-pay | Admitting: Radiation Oncology

## 2014-06-30 ENCOUNTER — Ambulatory Visit
Admission: RE | Admit: 2014-06-30 | Discharge: 2014-06-30 | Disposition: A | Discharge status: Home / Self Care | Payer: Medicare Other | Source: Ambulatory Visit | Attending: Radiation Oncology | Admitting: Radiation Oncology

## 2014-06-30 VITALS — BP 133/64 | HR 87 | Resp 16 | Wt 222.0 lb

## 2014-06-30 DIAGNOSIS — D329 Benign neoplasm of meninges, unspecified: Secondary | ICD-10-CM

## 2014-06-30 NOTE — Progress Notes (Signed)
Edema of both feet continue. Shortness of breath noted. Denies pain. Reports chronic ringing in left ear continues. Denies headache, dizziness, nausea, vomiting, or diplopia. Patient reports seizure activity after he stopped decadron. Denies any seizures since resuming 2 mg decadron each morning. Slow steady gait noted with assistance of a cane.

## 2014-07-25 ENCOUNTER — Other Ambulatory Visit: Payer: Self-pay | Admitting: Radiation Therapy

## 2014-07-25 ENCOUNTER — Encounter: Payer: Self-pay | Admitting: Radiation Therapy

## 2014-07-25 DIAGNOSIS — D329 Benign neoplasm of meninges, unspecified: Secondary | ICD-10-CM

## 2014-07-25 NOTE — Progress Notes (Unsigned)
I called Mr. [name] to let him know about his next MRI and follow-up. While on the phone, [name] said that he has been experiencing extreme fatigue. He described having to sit down and rest after getting out of the shower each morning because it takes so much out of him. He denied dizziness or headache, but says that he has to be careful while walking because he gets "off balance".        I encouraged him that he should not be experiencing fatigue from the radiation treatments completed on 05/29/14. He did just completed his steroid taper the last week in October though.       He says that he does not know which doctor to go to for the problems he is experiencing. But he was recently seen by his primary MD for the complaint of fatigue and swelling in his lower legs. His primary gave him  "a fluid pill" prescription and told to drink more water. [name] asked that I tell Dr. Tammi Klippel what he is experiencing to see if he has any suggestions for improvement.      Diagnosis: 80 yo man with recurrent grade II meningioma of the right frontoparietal convexity  Radiation treatment dates: 05/15/2014, 05/19/2014, 05/21/2014, 05/23/2014, 05/29/2014  Site/dose/beams/energy: Right fronto-parietal meningioma target was treated using 3 Rapid Arc VMAT Beams with 5 fractions of 5 Gy to 25 Gy.  Next appointments :  MRI 12/11 and see Dr. Tammi Klippel 12/14  .

## 2014-07-29 ENCOUNTER — Telehealth: Payer: Self-pay | Admitting: Radiation Oncology

## 2014-07-29 NOTE — Telephone Encounter (Signed)
Phoned to reassure patient that radiation fatigue is temporary and he should start feeling better soon. Patient reports he is scheduled to follow up with his PCP today at 1215. Patient reports shortness of breath with mild exertion and weakness. Will contact patient later this afternoon to follow up reference PCP visit. Patient expressed appreciation for the call.

## 2014-07-29 NOTE — Telephone Encounter (Signed)
Phoned patient to check in after PCP appointment. Patient reports his PCP referred him to a cardiologist to evaluate his fatigue. Stressed again that fatigue associated with radiation should continue to improve. Will check in with patient in one week. Patient understands to call with future needs.

## 2014-07-31 ENCOUNTER — Emergency Department (HOSPITAL_COMMUNITY)
Admission: EM | Admit: 2014-07-31 | Discharge: 2014-07-31 | Disposition: A | Discharge status: Home / Self Care | Payer: Medicare Other | Attending: Emergency Medicine | Admitting: Emergency Medicine

## 2014-07-31 ENCOUNTER — Ambulatory Visit: Payer: Medicare Other | Admitting: Cardiology

## 2014-07-31 ENCOUNTER — Ambulatory Visit (INDEPENDENT_AMBULATORY_CARE_PROVIDER_SITE_OTHER): Payer: Medicare Other | Admitting: Cardiology

## 2014-07-31 ENCOUNTER — Emergency Department (HOSPITAL_COMMUNITY): Payer: Medicare Other

## 2014-07-31 ENCOUNTER — Encounter: Payer: Self-pay | Admitting: Cardiology

## 2014-07-31 ENCOUNTER — Encounter (HOSPITAL_COMMUNITY): Payer: Self-pay | Admitting: Emergency Medicine

## 2014-07-31 VITALS — BP 112/72 | HR 87 | Ht 68.0 in | Wt 216.8 lb

## 2014-07-31 DIAGNOSIS — I10 Essential (primary) hypertension: Secondary | ICD-10-CM | POA: Diagnosis not present

## 2014-07-31 DIAGNOSIS — R531 Weakness: Secondary | ICD-10-CM | POA: Diagnosis present

## 2014-07-31 DIAGNOSIS — K219 Gastro-esophageal reflux disease without esophagitis: Secondary | ICD-10-CM | POA: Insufficient documentation

## 2014-07-31 DIAGNOSIS — D649 Anemia, unspecified: Secondary | ICD-10-CM | POA: Diagnosis not present

## 2014-07-31 DIAGNOSIS — Z8546 Personal history of malignant neoplasm of prostate: Secondary | ICD-10-CM | POA: Diagnosis not present

## 2014-07-31 DIAGNOSIS — Z86011 Personal history of benign neoplasm of the brain: Secondary | ICD-10-CM | POA: Diagnosis not present

## 2014-07-31 DIAGNOSIS — E785 Hyperlipidemia, unspecified: Secondary | ICD-10-CM | POA: Insufficient documentation

## 2014-07-31 DIAGNOSIS — T733XXA Exhaustion due to excessive exertion, initial encounter: Secondary | ICD-10-CM

## 2014-07-31 DIAGNOSIS — G2581 Restless legs syndrome: Secondary | ICD-10-CM | POA: Diagnosis not present

## 2014-07-31 DIAGNOSIS — Z79899 Other long term (current) drug therapy: Secondary | ICD-10-CM | POA: Insufficient documentation

## 2014-07-31 DIAGNOSIS — R0602 Shortness of breath: Secondary | ICD-10-CM | POA: Insufficient documentation

## 2014-07-31 DIAGNOSIS — R600 Localized edema: Secondary | ICD-10-CM | POA: Insufficient documentation

## 2014-07-31 DIAGNOSIS — R609 Edema, unspecified: Secondary | ICD-10-CM

## 2014-07-31 DIAGNOSIS — Z923 Personal history of irradiation: Secondary | ICD-10-CM | POA: Diagnosis not present

## 2014-07-31 DIAGNOSIS — Z8739 Personal history of other diseases of the musculoskeletal system and connective tissue: Secondary | ICD-10-CM | POA: Diagnosis not present

## 2014-07-31 DIAGNOSIS — G4710 Hypersomnia, unspecified: Secondary | ICD-10-CM

## 2014-07-31 DIAGNOSIS — Z85828 Personal history of other malignant neoplasm of skin: Secondary | ICD-10-CM | POA: Diagnosis not present

## 2014-07-31 DIAGNOSIS — R5383 Other fatigue: Secondary | ICD-10-CM | POA: Diagnosis not present

## 2014-07-31 LAB — BASIC METABOLIC PANEL
Anion gap: 17 — ABNORMAL HIGH (ref 5–15)
BUN: 16 mg/dL (ref 6–23)
CALCIUM: 9.4 mg/dL (ref 8.4–10.5)
CO2: 23 mEq/L (ref 19–32)
CREATININE: 1.4 mg/dL — AB (ref 0.50–1.35)
Chloride: 98 mEq/L (ref 96–112)
GFR calc non Af Amer: 46 mL/min — ABNORMAL LOW (ref >90)
GFR, EST AFRICAN AMERICAN: 53 mL/min — AB (ref >90)
Glucose, Bld: 99 mg/dL (ref 70–99)
Potassium: 4.6 mEq/L (ref 3.7–5.3)
Sodium: 138 mEq/L (ref 137–147)

## 2014-07-31 LAB — CBC WITH DIFFERENTIAL/PLATELET
BASOS PCT: 1 % (ref 0–1)
Basophils Absolute: 0.1 10*3/uL (ref 0.0–0.1)
EOS PCT: 2 % (ref 0–5)
Eosinophils Absolute: 0.2 10*3/uL (ref 0.0–0.7)
HEMATOCRIT: 33.2 % — AB (ref 39.0–52.0)
HEMOGLOBIN: 10.5 g/dL — AB (ref 13.0–17.0)
Lymphocytes Relative: 23 % (ref 12–46)
Lymphs Abs: 1.6 10*3/uL (ref 0.7–4.0)
MCH: 29.4 pg (ref 26.0–34.0)
MCHC: 31.6 g/dL (ref 30.0–36.0)
MCV: 93 fL (ref 78.0–100.0)
MONO ABS: 1.1 10*3/uL — AB (ref 0.1–1.0)
MONOS PCT: 15 % — AB (ref 3–12)
Neutro Abs: 4.1 10*3/uL (ref 1.7–7.7)
Neutrophils Relative %: 59 % (ref 43–77)
Platelets: 427 10*3/uL — ABNORMAL HIGH (ref 150–400)
RBC: 3.57 MIL/uL — ABNORMAL LOW (ref 4.22–5.81)
RDW: 13.6 % (ref 11.5–15.5)
WBC: 7 10*3/uL (ref 4.0–10.5)

## 2014-07-31 LAB — I-STAT CHEM 8, ED
BUN: 15 mg/dL (ref 6–23)
CALCIUM ION: 1.14 mmol/L (ref 1.13–1.30)
CHLORIDE: 101 meq/L (ref 96–112)
CREATININE: 1.5 mg/dL — AB (ref 0.50–1.35)
GLUCOSE: 103 mg/dL — AB (ref 70–99)
HCT: 34 % — ABNORMAL LOW (ref 39.0–52.0)
Hemoglobin: 11.6 g/dL — ABNORMAL LOW (ref 13.0–17.0)
POTASSIUM: 4.4 meq/L (ref 3.7–5.3)
Sodium: 136 mEq/L — ABNORMAL LOW (ref 137–147)
TCO2: 27 mmol/L (ref 0–100)

## 2014-07-31 LAB — URINALYSIS, ROUTINE W REFLEX MICROSCOPIC
BILIRUBIN URINE: NEGATIVE
Glucose, UA: NEGATIVE mg/dL
Hgb urine dipstick: NEGATIVE
KETONES UR: NEGATIVE mg/dL
Leukocytes, UA: NEGATIVE
Nitrite: NEGATIVE
Protein, ur: NEGATIVE mg/dL
SPECIFIC GRAVITY, URINE: 1.014 (ref 1.005–1.030)
UROBILINOGEN UA: 0.2 mg/dL (ref 0.0–1.0)
pH: 6.5 (ref 5.0–8.0)

## 2014-07-31 LAB — PRO B NATRIURETIC PEPTIDE: PRO B NATRI PEPTIDE: 70.2 pg/mL (ref 0–450)

## 2014-07-31 LAB — TSH: TSH: 1.61 u[IU]/mL (ref 0.350–4.500)

## 2014-07-31 LAB — CBG MONITORING, ED: Glucose-Capillary: 104 mg/dL — ABNORMAL HIGH (ref 70–99)

## 2014-07-31 LAB — I-STAT TROPONIN, ED: Troponin i, poc: 0.01 ng/mL (ref 0.00–0.08)

## 2014-07-31 NOTE — Discharge Instructions (Signed)
Follow-up with Dr. Radford Pax.  Copies of labs and x-ray attached on back. Return to the ED for new or worsening symptoms.

## 2014-07-31 NOTE — ED Provider Notes (Signed)
CSN: [id number]     Arrival date & time 07/31/14  1730 History   First MD Initiated Contact with Patient 07/31/14 1752     Chief Complaint  Patient presents with  . Weakness     (Consider location/radiation/quality/duration/timing/severity/associated sxs/prior Treatment) Patient is a 80 y.o. male presenting with weakness. The history is provided by the patient and medical records.  Weakness Associated symptoms include fatigue and weakness.    This is an 80 year old male with past medical history significant for HTN, anemia, prostate cancer, hyperlipidemia, brain tumor s/p radiation, presenting to the ED for generalized weakness/fatigue for the past 3 months.  Patient was diagnosed with a right parietal meningioma in January of this year. He has been followed closely by oncology and neurology and has successfully completed his radiation.  States he has been told that his generalized weakness is likely a component of his radiation therapy, however he states weakness was present prior to starting radiation. States in the morning he generally feels well but becomes progressively more weak throughout the day. States he does have intermittent episodes of lightheadedness, but no syncope. He states he has occasional shortness of breath, his cardiologist Dr. Radford Pax has added extra Lasix. He states his lower extremity edema has improved, but her shortness of breath is unchanged.  No other medication changes.  He also had blood work done a few weeks ago by his primary care physician with concern of anemia.  He denies any recent fever, chills, sweats, melena, hematochezia, nausea, vomiting, diarrhea.   VS stable on arrival.  Past Medical History  Diagnosis Date  . Anemia   . Prostate cancer   . Basal cell cancer   . Hyperlipidemia   . Restless leg   . GERD (gastroesophageal reflux disease)   . Brain tumor   . History of radiation therapy     back approximately 2010 prostate radiation under care of Dr.  Valere Dross   . Hypertension   . Herniated disc     lumbar spine   Past Surgical History  Procedure Laterality Date  . Joint replacement      right knee replacement  . Bilateral cateract surgery    . Vasectomy    . Tonsillectomy    . Colonoscopy      colon polyp removed  . Craniotomy Right 09/27/2013    Procedure: RIGHT FRONTAL CRANIOTOMY FOR TUMOR RESECTION ;  Surgeon: Consuella Lose, MD;  Location: [facility];  Service: Neurosurgery;  Laterality: Right;  . Prostate biopsy     Family History  Problem Relation Age of Onset  . Parkinsonism Mother   . Dementia Father    History  Substance Use Topics  . Smoking status: Never Smoker   . Smokeless tobacco: Former Systems developer    Types: [place] date: 10/08/1973  . Alcohol Use: No    Review of Systems  Constitutional: Positive for fatigue.  Neurological: Positive for weakness.  All other systems reviewed and are negative.     Allergies  Asa and Vicodin  Home Medications   Prior to Admission medications   Medication Sig Start Date End Date Taking? Authorizing Provider  beta carotene w/minerals (OCUVITE) tablet Take 1 tablet by mouth daily. 10/18/13   Lavon Paganini Angiulli, PA-C  diclofenac sodium (VOLTAREN) 1 % GEL Apply 2 g topically 4 (four) times daily. 11/15/13   Charlett Blake, MD  ferrous sulfate 325 (65 FE) MG EC tablet Take 1 tablet (325 mg total) by mouth daily  with breakfast. 10/18/13   Lavon Paganini Angiulli, PA-C  furosemide (LASIX) 20 MG tablet  12/10/13   Historical Provider, MD  KLOR-CON M20 20 MEQ tablet Take 20 mg by mouth daily. 07/29/14   Historical Provider, MD  Omega-3 Fatty Acids (FISH OIL) 1000 MG CAPS Take 1,000 mg by mouth every morning.    Historical Provider, MD  omeprazole (PRILOSEC) 20 MG capsule Take 1 capsule (20 mg total) by mouth daily. 10/18/13   Lavon Paganini Angiulli, PA-C  Probiotic Product (PROBIOTIC DAILY PO) Take by mouth.    Historical Provider, MD  rOPINIRole (REQUIP) 1 MG tablet Take 1 tablet (1  mg total) by mouth at bedtime. 10/18/13   Lavon Paganini Angiulli, PA-C  simvastatin (ZOCOR) 40 MG tablet Take 1 tablet (40 mg total) by mouth every evening. 10/18/13   Lavon Paganini Angiulli, PA-C  solifenacin (VESICARE) 5 MG tablet Take 1 tablet (5 mg total) by mouth daily. 10/18/13   Lavon Paganini Angiulli, PA-C  tamsulosin (FLOMAX) 0.4 MG CAPS capsule Take 1 capsule (0.4 mg total) by mouth daily after supper. 10/18/13   Lavon Paganini Angiulli, PA-C  valsartan (DIOVAN) 160 MG tablet  03/17/14   Historical Provider, MD  vitamin B-12 (CYANOCOBALAMIN) 100 MCG tablet Take 300 mcg by mouth daily.     Historical Provider, MD   BP 123/55 mmHg  Pulse 92  Temp(Src) 98 F (36.7 C) (Oral)  Resp 17  Ht 5\' 8"  (1.727 m)  Wt 220 lb (99.791 kg)  BMI 33.46 kg/m2  SpO2 100%   Physical Exam  Constitutional: He is oriented to person, place, and time. He appears well-developed and well-nourished.  HENT:  Head: Normocephalic and atraumatic.  Mouth/Throat: Oropharynx is clear and moist.  Eyes: Conjunctivae and EOM are normal. Pupils are equal, round, and reactive to light.  Neck: Normal range of motion. Neck supple.  Cardiovascular: Normal rate, regular rhythm and normal heart sounds.   Pulmonary/Chest: Effort normal and breath sounds normal. No respiratory distress. He has no wheezes.  Abdominal: Soft. Bowel sounds are normal. There is no tenderness. There is no guarding.  Musculoskeletal: Normal range of motion.  1+ pitting edema BLE  Neurological: He is alert and oriented to person, place, and time.  AAOx3, answering questions appropriately; equal strength UE and LE bilaterally; CN grossly intact; moves all extremities appropriately without ataxia; no focal neuro deficits or facial asymmetry appreciated  Skin: Skin is warm and dry.  Psychiatric: He has a normal mood and affect. His speech is not slurred.  Speech clear; goal oriented  Nursing note and vitals reviewed.   ED Course  Procedures (including critical care  time) Labs Review Labs Reviewed  CBC WITH DIFFERENTIAL - Abnormal; Notable for the following:    RBC 3.57 (*)    Hemoglobin 10.5 (*)    HCT 33.2 (*)    Platelets 427 (*)    Monocytes Relative 15 (*)    Monocytes Absolute 1.1 (*)    All other components within normal limits  BASIC METABOLIC PANEL - Abnormal; Notable for the following:    Creatinine, Ser 1.40 (*)    GFR calc non Af Amer 46 (*)    GFR calc Af Amer 53 (*)    Anion gap 17 (*)    All other components within normal limits  CBG MONITORING, ED - Abnormal; Notable for the following:    Glucose-Capillary 104 (*)    All other components within normal limits  I-STAT CHEM 8, ED - Abnormal; Notable  for the following:    Sodium 136 (*)    Creatinine, Ser 1.50 (*)    Glucose, Bld 103 (*)    Hemoglobin 11.6 (*)    HCT 34.0 (*)    All other components within normal limits  URINALYSIS, ROUTINE W REFLEX MICROSCOPIC  PRO B NATRIURETIC PEPTIDE  TSH  I-STAT TROPOININ, ED    Imaging Review Dg Chest 2 View  07/31/2014   CLINICAL DATA:  Shortness of breath with exertion over the past few weeks. Dizziness and weakness.  EXAM: CHEST  2 VIEW  COMPARISON:  09/27/2013.  FINDINGS: The heart remains normal in size. Moderately large hiatal hernia. Diffuse peribronchial thickening. Mildly prominent interstitial markings. Diffuse osteopenia. Mild thoracic spine degenerative changes.  IMPRESSION: 1. Mild bronchitic changes with mild progression. 2. Mild chronic interstitial lung disease. 3. Moderately large hiatal hernia.   Electronically Signed   By: Enrique Sack M.D.   On: 07/31/2014 18:51     EKG Interpretation None      MDM   Final diagnoses:  SOB (shortness of breath)  Other fatigue   80 year old male presenting with 3 month history of fatigue and exertional shortness of breath. He denies any exertional chest pain. Patient was evaluated by cardiology earlier this morning and scheduled for outpatient echocardiogram to be performed on  08/06/2014.  On exam, he is alert and baseline oriented without focal neurologic deficit. His vital signs are currently stable. Will obtain basic labs, EKG, chest x-ray.  He does have hx of meningioma, however no focal neurologic deficits on exam today.  Recent MRI in September, do not feel repeat indicated at this time.  EKG NSR without ischemic changes.  Trop negative.  CXR clear.  Lab work with mildly elevated SrCr of 1.4 (baseline appears to be around 1.0 when compared with previous, although he has fluctuated higher in the past to around 2.0).  Patient did recently increase his lasix which may be contributing to this.  Lab work otherwise reassuring.  U/a non-infectious.  TSH WNL.  No definitive source of patient's fatigue or findings to warrant admission found today.  Patient has been scheduled for echo on 08/06/14, however he and wife are hesitant to wait to have this done.  Attempted to speak with on call cardiology, however page was not returned.  Have sent inbox message to Dr. Radford Pax to see if earlier appt available.  Patient scheduled to have blood work done tomorrow at cardiology office, he will follow-up on this then.  Discussed plan with patient, he/she acknowledged understanding and agreed with plan of care.  Return precautions given for new or worsening symptoms.  Case discussed with attending physician, Dr. Wilson Singer, who personally evaluated patient and agrees with assessment and plan of care.  Larene Pickett, PA-C 07/31/14 2319  Virgel Manifold, MD 08/07/14 2778  Virgel Manifold, MD 08/07/14 [phone]

## 2014-07-31 NOTE — Progress Notes (Signed)
[place], [address] [place], [place]  [address] Phone: [phone] Fax:  [phone]  Date:  07/31/2014   ID:  TEMITAYO COVALT, DOB Oct 29, 1930, MRN [id number]  PCP:   Melinda Crutch, MD  Cardiologist:  Fransico Him, MD    History of Present Illness: This is an 80yo male with a history of GERD, HTN and prostate CA who presents for evaluation of fatigue.  He had blood work done at Dr. Alan Ripper office which showed mild anemia with a Hbg of 10.8.  He also had had problems with SOB which has been occurring for 3-4 weeks.  He had a brain tumor resected and then had radiation and steroids which he is off now.  He has a history of dyslipidemia and has never smoked.  He denies any chest pain or pressure. He started having LE edema after taking the steroids but even after stopping them it has persisted.  He denies any  palpitations or syncope but occasionally feels whoozy when standing up.    Wt Readings from Last 3 Encounters:  07/31/14 216 lb 12.8 oz (98.34 kg)  06/30/14 222 lb (100.699 kg)  05/09/14 207 lb 8 oz (94.121 kg)     Past Medical History  Diagnosis Date  . Anemia   . Prostate cancer   . Basal cell cancer   . Hyperlipidemia   . Restless leg   . GERD (gastroesophageal reflux disease)   . Brain tumor   . History of radiation therapy     back approximately 2010 prostate radiation under care of Dr. Valere Dross   . Hypertension   . Herniated disc     lumbar spine    Current Outpatient Prescriptions  Medication Sig Dispense Refill  . beta carotene w/minerals (OCUVITE) tablet Take 1 tablet by mouth daily. 30 tablet 1  . diclofenac sodium (VOLTAREN) 1 % GEL Apply 2 g topically 4 (four) times daily. 3 Tube 1  . ferrous sulfate 325 (65 FE) MG EC tablet Take 1 tablet (325 mg total) by mouth daily with breakfast. 30 tablet 3  . furosemide (LASIX) 20 MG tablet     . KLOR-CON M20 20 MEQ tablet Take 20 mg by mouth daily.  0  . Omega-3 Fatty Acids (FISH OIL) 1000 MG CAPS Take 1,000 mg by mouth  every morning.    Marland Kitchen omeprazole (PRILOSEC) 20 MG capsule Take 1 capsule (20 mg total) by mouth daily. 30 capsule 1  . Probiotic Product (PROBIOTIC DAILY PO) Take by mouth.    Marland Kitchen rOPINIRole (REQUIP) 1 MG tablet Take 1 tablet (1 mg total) by mouth at bedtime. 60 tablet 1  . simvastatin (ZOCOR) 40 MG tablet Take 1 tablet (40 mg total) by mouth every evening. 30 tablet 1  . solifenacin (VESICARE) 5 MG tablet Take 1 tablet (5 mg total) by mouth daily. 30 tablet 1  . tamsulosin (FLOMAX) 0.4 MG CAPS capsule Take 1 capsule (0.4 mg total) by mouth daily after supper. 30 capsule 1  . valsartan (DIOVAN) 160 MG tablet     . vitamin B-12 (CYANOCOBALAMIN) 100 MCG tablet Take 300 mcg by mouth daily.     Marland Kitchen dexamethasone (DECADRON) 4 MG tablet Take 0.5 tablets (2 mg total) by mouth 2 (two) times daily. 30 tablet 2   No current facility-administered medications for this visit.    Allergies:    Allergies  Allergen Reactions  . Asa [Aspirin] Nausea Only  . Vicodin [Hydrocodone-Acetaminophen] Nausea Only    Social History:  The patient  reports that he has never smoked. He quit smokeless tobacco use about 40 years ago. His smokeless tobacco use included Chew. He reports that he does not drink alcohol or use illicit drugs.   Family History:  The patient's family history includes Dementia in his father; Parkinsonism in his mother.   ROS:  Please see the history of present illness.      All other systems reviewed and negative.   PHYSICAL EXAM: VS:  BP 112/72 mmHg  Pulse 87  Ht 5\' 8"  (1.727 m)  Wt 216 lb 12.8 oz (98.34 kg)  BMI 32.97 kg/m2 Well nourished, well developed, in no acute distress HEENT: normal Neck: no JVD Cardiac:  normal S1, S2; RRR; no murmur Lungs:  clear to auscultation bilaterally, no wheezing, rhonchi or rales Abd: soft, nontender, no hepatomegaly Ext: trace edema Skin: warm and dry Neuro:  CNs 2-12 intact, no focal abnormalities noted  EKG:  NSR with no ST changes     ASSESSMENT  AND PLAN:  1.  SOB of unclear etiology.  His EKG is nonischemia.  He has no CP.  He is mildly anemic although I'm not sure this is the primary etiology.  Need to consider coronary ischemia. - I will get a lexiscan myoview to rule out ischemia and 2D echo to assess LVF - check BNP 2.  Exertional fatigue of unclear etiology - this may be due to anemia but need to consider coronary ischemia.  He also complains of daytime sleepiness and falls asleep easily during the day.  His wife says he snores so need to consider OSA.   - I will set him up for a split night sleep study 3.  HTN well controlled - continue Valsartan 4.  Chronic LE edema - continue diuretics   Followup with me PRN pending results of studies   Signed, Fransico Him, MD [facility] 07/31/2014 2:12 PM

## 2014-07-31 NOTE — Patient Instructions (Addendum)
Your physician has recommended that you have a sleep study. This test records several body functions during sleep, including: brain activity, eye movement, oxygen and carbon dioxide blood levels, heart rate and rhythm, breathing rate and rhythm, the flow of air through your mouth and nose, snoring, body muscle movements, and chest and belly movement.  Your physician has requested that you have a lexiscan myoview. For further information please visit [email or web address]. Please follow instruction sheet, as given.  Your physician has requested that you have an echocardiogram. Echocardiography is a painless test that uses sound waves to create images of your heart. It provides your doctor with information about the size and shape of your heart and how well your heart's chambers and valves are working. This procedure takes approximately one hour. There are no restrictions for this procedure.  Your physician recommends that you schedule a follow-up appointment AS NEEDED with Dr. Radford Pax.

## 2014-07-31 NOTE — ED Notes (Signed)
Pt from home with report of increasing weakness x3 months, pt with hx of left sided brain tumor in 01/15 and received radiation from 05/15- 06/15. Pt denies any cp or sob at this time, reports intermittent dizzy spells. No neuro deficits noted at this time. EKG done and given to Dr. Wilson Singer.

## 2014-08-01 ENCOUNTER — Other Ambulatory Visit (INDEPENDENT_AMBULATORY_CARE_PROVIDER_SITE_OTHER): Payer: Medicare Other | Admitting: *Deleted

## 2014-08-01 DIAGNOSIS — T733XXA Exhaustion due to excessive exertion, initial encounter: Secondary | ICD-10-CM

## 2014-08-01 DIAGNOSIS — R0602 Shortness of breath: Secondary | ICD-10-CM

## 2014-08-02 LAB — CORTISOL-AM, BLOOD: CORTISOL - AM: 13.2 ug/dL (ref 4.3–22.4)

## 2014-08-06 ENCOUNTER — Telehealth: Payer: Self-pay | Admitting: Radiation Oncology

## 2014-08-06 NOTE — Telephone Encounter (Signed)
Phoned to assess patient status. Patient reports he doesn't feel any better. Clarifies he continues to feel weak and gets short of breath easily. Denies cough. Reports he was seen by his cardiologist last week and is scheduled for a stress test and ultrasound on Friday. Also, patient scheduled for a sleep study in January. Patient reports he presented to the ED on 11/12 but, "they didn't find anything wrong." Went on to say that "Dr. Wilson Singer did two EKGs but, they were normal."

## 2014-08-08 ENCOUNTER — Ambulatory Visit (HOSPITAL_COMMUNITY): Payer: Medicare Other | Attending: Internal Medicine | Admitting: Radiology

## 2014-08-08 ENCOUNTER — Ambulatory Visit (HOSPITAL_BASED_OUTPATIENT_CLINIC_OR_DEPARTMENT_OTHER): Payer: Medicare Other | Admitting: Cardiology

## 2014-08-08 DIAGNOSIS — R0602 Shortness of breath: Secondary | ICD-10-CM

## 2014-08-08 DIAGNOSIS — I10 Essential (primary) hypertension: Secondary | ICD-10-CM | POA: Insufficient documentation

## 2014-08-08 DIAGNOSIS — R55 Syncope and collapse: Secondary | ICD-10-CM | POA: Diagnosis not present

## 2014-08-08 DIAGNOSIS — R5383 Other fatigue: Secondary | ICD-10-CM | POA: Insufficient documentation

## 2014-08-08 MED ORDER — TECHNETIUM TC 99M SESTAMIBI GENERIC - CARDIOLITE
10.0000 | Freq: Once | INTRAVENOUS | Status: AC | PRN
  Administered 2014-08-08: 10 via INTRAVENOUS

## 2014-08-08 MED ORDER — TECHNETIUM TC 99M SESTAMIBI GENERIC - CARDIOLITE
30.0000 | Freq: Once | INTRAVENOUS | Status: AC | PRN
  Administered 2014-08-08: 30 via INTRAVENOUS

## 2014-08-08 MED ORDER — REGADENOSON 0.4 MG/5ML IV SOLN
0.4000 mg | Freq: Once | INTRAVENOUS | Status: AC
Start: 2014-08-08 — End: 2014-08-08
  Administered 2014-08-08: 0.4 mg via INTRAVENOUS

## 2014-08-08 NOTE — Progress Notes (Signed)
Echo performed. 

## 2014-08-08 NOTE — Progress Notes (Signed)
[facility] [address] [place], [place] [address] [phone]    Cardiology Nuclear Med Study  [name] is a 80 y.o. male     MRN : [id number]     DOB: 1931-01-20  Procedure Date: 08/08/2014  Nuclear Med Background Indication for Stress Test:  Evaluation for Ischemia and [facility] History:  no prior cardiac hx or testing; [facility] 07/31/14-weakness/fatigue Cardiac Risk Factors: Hypertension  Symptoms:  Fatigue with Exertion, Near Syncope and SOB   Nuclear Pre-Procedure Caffeine/Decaff Intake:  None NPO After: 5:00 pm   Lungs:  clear O2 Sat: 96% on room air. IV 0.9% NS with Angio Cath:  22g  IV Site: R Antecubital  IV Started by:  Ileene Hutchinson, EMT-P  Chest Size (in):  50 Cup Size: n/a  Height: 5\' 8"  (1.727 m)  Weight:  217 lb (98.431 kg)  BMI:  Body mass index is 33 kg/(m^2). Tech Comments:  Held lasix    Nuclear Med Study 1 or 2 day study: 1 day  Stress Test Type:  Carlton Adam  Reading MD: Candee Furbish, MD  Order Authorizing Dawana Asper:  T. Turner, MD  Resting Radionuclide: Technetium 80m Sestamibi  Resting Radionuclide Dose: 11.0 mCi   Stress Radionuclide:  Technetium 12m Sestamibi  Stress Radionuclide Dose: 33.0 mCi           Stress Protocol Rest HR: 81 Stress HR: 88  Rest BP: 99/57 Stress BP: 104/60  Exercise Time (min): n/a METS: n/a   Predicted Max HR: 139 bpm % Max HR: 63.31 bpm Rate Pressure Product: 9152   Dose of Adenosine (mg):  n/a Dose of Lexiscan: 0.4 mg  Dose of Atropine (mg): n/a Dose of Dobutamine: n/a mcg/kg/min (at max HR)  Stress Test Technologist: Ileene Hutchinson, EMT-P  Nuclear Technologist:  Earl Many, CNMT     Rest Procedure:  Myocardial perfusion imaging was performed at rest 45 minutes following the intravenous administration of Technetium 49m Sestamibi. Rest ECG: NSR - Normal EKG  Stress Procedure:  The patient received IV Lexiscan 0.4 mg over 15-seconds.  Technetium 87m Sestamibi injected at  30-seconds.  Quantitative spect images were obtained after a 45 minute delay. Stress ECG: No significant change from baseline ECG  QPS Raw Data Images:  Normal; no motion artifact; normal heart/lung ratio. Stress Images:  Normal homogeneous uptake in all areas of the myocardium. Rest Images:  Normal homogeneous uptake in all areas of the myocardium. Subtraction (SDS):  No evidence of ischemia. Transient Ischemic Dilatation (Normal <1.22):  0.85 Lung/Heart Ratio (Normal <0.45):  0.36  Quantitative Gated Spect Images QGS EDV:  56 ml QGS ESV:  13 ml  Impression Exercise Capacity:  Lexiscan with no exercise. BP Response:  Blood pressure fairly low throughout study. Clinical Symptoms:  Typical symptoms with Lexiscan ECG Impression:  No significant ST segment change suggestive of ischemia. Comparison with Prior Nuclear Study: No images to compare  Overall Impression:  Low risk stress nuclear study with no ischemia identified.  LV Ejection Fraction: 76%.  LV Wall Motion:  NL LV Function; NL Wall Motion  Candee Furbish, MD

## 2014-08-12 ENCOUNTER — Other Ambulatory Visit: Payer: Self-pay

## 2014-08-12 DIAGNOSIS — I38 Endocarditis, valve unspecified: Secondary | ICD-10-CM

## 2014-08-20 ENCOUNTER — Ambulatory Visit
Admission: RE | Admit: 2014-08-20 | Discharge: 2014-08-20 | Disposition: A | Discharge status: Home / Self Care | Payer: Medicare Other | Source: Ambulatory Visit | Attending: Radiation Oncology | Admitting: Radiation Oncology

## 2014-08-20 DIAGNOSIS — D329 Benign neoplasm of meninges, unspecified: Secondary | ICD-10-CM

## 2014-08-20 MED ORDER — GADOBENATE DIMEGLUMINE 529 MG/ML IV SOLN
20.0000 mL | Freq: Once | INTRAVENOUS | Status: AC | PRN
  Administered 2014-08-20: 20 mL via INTRAVENOUS

## 2014-08-23 ENCOUNTER — Encounter: Payer: Self-pay | Admitting: Radiation Therapy

## 2014-08-24 NOTE — Progress Notes (Signed)
Radiation Oncology         [phone]     Name: [name]  Date: 06/30/2014  MRN: [id number]  DOB: July 05, 1931    Multidisciplinary [facility] Clinic Follow-Up Visit Note  CC:  Melinda Crutch, MD Melinda Crutch, MD  Diagnosis: 80 yo man with recurrent grade II meningioma of the right frontoparietal convexity s/p fractionated stereotactic radiotherapy to 25 Gy in 5 fractions of 5 Gy through 05/29/2014   Interval Since Last Radiation:  3  months  Narrative:  The patient returns today for routine follow-up.  The recent films were presented in our multidisciplinary conference with neuroradiology just prior to the clinic.  Weight and vitals stable. Denies pain. Continues to ambulate with cane. Reports weakness. Reports SOB. Cleared by cardiologist. PCP, Harrington Challenger believes the patient is bleeding internally. Sleep study scheduled for 1/7. Reports dizziness when he stands up too quickly. Denies headache, nausea, vomiting or diplopia. Reports ringing in the ears continues. Edema of both feet noted. Reports occasionally he feels like a brick is tied to his left leg. Additionally, he reports difficulty controlling his left arm at times                              ALLERGIES:  is allergic to asa and vicodin.  Meds: Current Outpatient Prescriptions  Medication Sig Dispense Refill  . b complex vitamins tablet Take 1 tablet by mouth daily.    . ferrous sulfate 325 (65 FE) MG EC tablet Take 1 tablet (325 mg total) by mouth daily with breakfast. 30 tablet 3  . furosemide (LASIX) 20 MG tablet Take 20 mg by mouth 2 (two) times daily.     Marland Kitchen KLOR-CON M20 20 MEQ tablet Take 20 mg by mouth every morning.   0  . Omega-3 Fatty Acids (FISH OIL) 1000 MG CAPS Take 1,000 mg by mouth every morning.    Marland Kitchen omeprazole (PRILOSEC) 20 MG capsule Take 1 capsule (20 mg total) by mouth daily. 30 capsule 1  . Probiotic Product (PROBIOTIC DAILY PO) Take 1 capsule by mouth daily.     Marland Kitchen rOPINIRole  (REQUIP) 2 MG tablet Take 1-2 mg by mouth at bedtime. Takes 1mg  at bedtime and takes 1mg  as needed FOR RESTLESS LEGS    . simvastatin (ZOCOR) 40 MG tablet Take 1 tablet (40 mg total) by mouth every evening. 30 tablet 1  . solifenacin (VESICARE) 5 MG tablet Take 1 tablet (5 mg total) by mouth daily. (Patient taking differently: Take 5 mg by mouth every morning. ) 30 tablet 1  . tamsulosin (FLOMAX) 0.4 MG CAPS capsule Take 1 capsule (0.4 mg total) by mouth daily after supper. (Patient taking differently: Take 0.4 mg by mouth daily after breakfast. ) 30 capsule 1  . valsartan-hydrochlorothiazide (DIOVAN-HCT) 160-12.5 MG per tablet Take 1 tablet by mouth daily.    . vitamin B-12 (CYANOCOBALAMIN) 100 MCG tablet Take 100 mcg by mouth daily.      No current facility-administered medications for this encounter.    Physical Findings: The patient is in no acute distress. Patient is alert and oriented.  weight is 220 lb 4.8 oz (99.927 kg). . Left strength 5-/5.   No significant changes.  Lab Findings: Lab Results  Component Value Date   WBC 7.0 07/31/2014   HGB 11.6* 07/31/2014   HCT 34.0* 07/31/2014   MCV 93.0 07/31/2014   PLT 427* 07/31/2014    @LASTCHEM @  Radiographic Findings: Dg Chest 2 View  07/31/2014   CLINICAL DATA:  Shortness of breath with exertion over the past few weeks. Dizziness and weakness.  EXAM: CHEST  2 VIEW  COMPARISON:  09/27/2013.  FINDINGS: The heart remains normal in size. Moderately large hiatal hernia. Diffuse peribronchial thickening. Mildly prominent interstitial markings. Diffuse osteopenia. Mild thoracic spine degenerative changes.  IMPRESSION: 1. Mild bronchitic changes with mild progression. 2. Mild chronic interstitial lung disease. 3. Moderately large hiatal hernia.   Electronically Signed   By: Enrique Sack M.D.   On: 07/31/2014 18:51   Mr Jeri Cos GH Contrast  08/20/2014   CLINICAL DATA:  80 year old male with who grade 2 meningioma status post resection and  subsequent stereotactic radiosurgery. Restaging. Subsequent encounter.  EXAM: MRI HEAD WITHOUT AND WITH CONTRAST  TECHNIQUE: Multiplanar, multiecho pulse sequences of the brain and surrounding structures were obtained without and with intravenous contrast.  CONTRAST:  61mL MULTIHANCE GADOBENATE DIMEGLUMINE 529 MG/ML IV SOLN  COMPARISON:  05/08/2014 and earlier.  FINDINGS: Motion artifact on some post-contrast imaging today.  Lobulated enhancing meningioma along the posterior right convexity re- identified and has slightly increased in size since 04/2014, now 31 x 42 x 24 mm (AP by transverse by CC) versus 32 x 39 x 20 mm at a comparable level previously.  In addition to this lobulated most confluent component of meningioma there is also midline en plaque disease tracking along the superior sagittal sinus and interhemispheric fissure (coronal series 11, image 18), as well as right dural convexity nodularity. These areas appear stable.  Overlying craniotomy changes are stable, it is unclear whether the meningioma is directly involving some of the craniotomy bony defects.  Subjacent brain parenchyma demonstrates stable T2 and FLAIR hyperintensity.  No new areas of abnormal enhancement. Stable nonspecific white matter signal changes elsewhere. 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 No restricted diffusion or evidence of acute infarction. No midline shift or ventriculomegaly. Stable basilar cisterns. No acute intracranial hemorrhage identified. Negative pituitary, cervicomedullary junction and visualized cervical spine. Visible internal auditory structures appear normal.  Visualized paranasal sinuses and mastoids are clear. Stable orbits soft tissues. Bone marrow signal is stable. No acute scalp soft tissue findings.  IMPRESSION: 1. Chronic meningioma with slightly increased size of the dominant lobulated component since August. Fairly extensive  en plaque component tracking in the midline and along the right dural convexity appears stable. Some of the latter may be directly invading the margins of the craniotomy site, unchanged. 2. Stable mild T2 and FLAIR hyperintensity in the right superior frontal gyrus. 3. No new intracranial abnormality.   Electronically Signed   By: Lars Pinks M.D.   On: 08/20/2014 14:57    Impression:  The patient is recovering from the effects of radiation.  Patient easily fatigued.  MRI essentially stable.  Plan:  MRI in 6 months then follow-up.  _____________________________________  Sheral Apley. Tammi Klippel, M.D.

## 2014-08-25 ENCOUNTER — Encounter: Payer: Self-pay | Admitting: Radiation Oncology

## 2014-08-25 ENCOUNTER — Ambulatory Visit
Admission: RE | Admit: 2014-08-25 | Discharge: 2014-08-25 | Disposition: A | Discharge status: Home / Self Care | Payer: Medicare Other | Source: Ambulatory Visit | Attending: Radiation Oncology | Admitting: Radiation Oncology

## 2014-08-25 VITALS — Wt 220.3 lb

## 2014-08-25 DIAGNOSIS — D329 Benign neoplasm of meninges, unspecified: Secondary | ICD-10-CM

## 2014-08-25 NOTE — Progress Notes (Signed)
Weight and vitals stable. Denies pain. Continues to ambulate with cane. Reports weakness. Reports SOB. Cleared by cardiologist. PCP, Harrington Challenger believes the patient is bleeding internally. Sleep study scheduled for 1/7. Reports dizziness when he stands up too quickly. Denies headache, nausea, vomiting or diplopia. Reports ringing in the ears continues. Edema of both feet noted. Reports occasionally he feels like a brick is tied to his left leg. Additionally, he reports difficulty controlling his left arm at times.

## 2014-08-28 ENCOUNTER — Other Ambulatory Visit: Payer: Self-pay | Admitting: Gastroenterology

## 2014-08-28 DIAGNOSIS — R630 Anorexia: Secondary | ICD-10-CM

## 2014-08-28 DIAGNOSIS — D500 Iron deficiency anemia secondary to blood loss (chronic): Secondary | ICD-10-CM

## 2014-08-29 ENCOUNTER — Other Ambulatory Visit: Payer: Medicare Other

## 2014-09-01 ENCOUNTER — Ambulatory Visit: Payer: Medicare Other

## 2014-09-01 ENCOUNTER — Ambulatory Visit: Payer: Medicare Other | Admitting: Radiation Oncology

## 2014-09-02 ENCOUNTER — Ambulatory Visit
Admission: RE | Admit: 2014-09-02 | Discharge: 2014-09-02 | Disposition: A | Discharge status: Home / Self Care | Payer: Medicare Other | Source: Ambulatory Visit | Attending: Gastroenterology | Admitting: Gastroenterology

## 2014-09-02 DIAGNOSIS — R630 Anorexia: Secondary | ICD-10-CM

## 2014-09-02 DIAGNOSIS — D500 Iron deficiency anemia secondary to blood loss (chronic): Secondary | ICD-10-CM

## 2014-09-02 MED ORDER — IOHEXOL 300 MG/ML  SOLN
125.0000 mL | Freq: Once | INTRAMUSCULAR | Status: AC | PRN
  Administered 2014-09-02: 125 mL via INTRAVENOUS

## 2014-09-25 ENCOUNTER — Encounter (HOSPITAL_BASED_OUTPATIENT_CLINIC_OR_DEPARTMENT_OTHER): Payer: Medicare Other

## 2014-10-23 ENCOUNTER — Ambulatory Visit (HOSPITAL_BASED_OUTPATIENT_CLINIC_OR_DEPARTMENT_OTHER): Payer: Medicare Other | Attending: Cardiology | Admitting: *Deleted

## 2014-10-23 VITALS — Ht 68.0 in | Wt 210.0 lb

## 2014-10-23 DIAGNOSIS — G4733 Obstructive sleep apnea (adult) (pediatric): Secondary | ICD-10-CM | POA: Insufficient documentation

## 2014-10-23 DIAGNOSIS — G4710 Hypersomnia, unspecified: Secondary | ICD-10-CM | POA: Diagnosis not present

## 2014-10-24 ENCOUNTER — Other Ambulatory Visit: Payer: Self-pay | Admitting: Radiation Therapy

## 2014-10-24 DIAGNOSIS — D329 Benign neoplasm of meninges, unspecified: Secondary | ICD-10-CM

## 2014-10-28 ENCOUNTER — Telehealth: Payer: Self-pay | Admitting: Cardiology

## 2014-10-28 ENCOUNTER — Encounter: Payer: Self-pay | Admitting: Cardiology

## 2014-10-28 DIAGNOSIS — G4733 Obstructive sleep apnea (adult) (pediatric): Secondary | ICD-10-CM | POA: Insufficient documentation

## 2014-10-28 HISTORY — DX: Obstructive sleep apnea (adult) (pediatric): G47.33

## 2014-10-28 NOTE — Telephone Encounter (Signed)
Please let patient know that he has moderate OSA and set up for CPAP titration

## 2014-10-28 NOTE — Sleep Study (Signed)
   NAME: [name] DATE OF BIRTH:  Jan 28, 1931 MEDICAL RECORD NUMBER [id number]  LOCATION: [facility]  PHYSICIAN: Lavaughn Bisig R  DATE OF STUDY: 10/23/2014  SLEEP STUDY TYPE: Nocturnal Polysomnogram               REFERRING PHYSICIAN: Sueanne Margarita, MD  INDICATION FOR STUDY: Excessive daytime sleepiness  EPWORTH SLEEPINESS SCORE: 2 HEIGHT: 5\' 8"  (172.7 cm)  WEIGHT: 210 lb (95.255 kg)    Body mass index is 31.94 kg/(m^2).  NECK SIZE: 17 in.  MEDICATIONS: Reviewed in the chart  SLEEP ARCHITECTURE: The patient slept for a total of 305 minutes with nos slow wave sleep and 25 minutes of REM sleep.  The onset to sleep latency was 5 minutes and onset to REM sleep latency was prolonged at 144 minutes.  The sleep efficiency was reduced at 80%.    RESPIRATORY DATA: The patient had 8 apneas of which 7 were obstructive and 1 was central.  There were 99 hypopneas.  The overall AHI was 21 events per hour consistent with moderate obstructive sleep apnea/hypopnea syndrome. Most events occurred during NREM sleep in the nonsupine position.  There was mild to moderate snoring noted.    OXYGEN DATA: The lowest Oxygen saturation during sleep was 86%.  During wake it was 77%.  The amount of time spent with O2 saturations < 88% was 4 minutes.    CARDIAC DATA: The patient maintained NSR throughout the study with heart rate ranging from 46 to 173 bpm.  The average HR was 82bpm.    MOVEMENT/PARASOMNIA: There were no periodic limb movement or REM behavior sleep disorders noted during the study.  IMPRESSION/ RECOMMENDATION:   1.  Moderate obstructive sleep apnea/hypopnea syndrome with an AHI of 21 events per hour.  Most events occurred in NREM sleep in the nonsupine position. 2.  Mild to moderate snoring was noted. 3.  No arrhythmias were noted during the study. 4.  Reduced sleep efficiency with increased frequency or arousals for respiratory events. 5.  Oxygen desaturations were noted  as low as 86% with respiratory events. 6.  Reduced REM sleep noted. 7.  Given the patient's degree of sleep apnea, oxygen desaturations and complaints of excessive daytime fatigue, CPAP titration is recommended.  Signed: Sueanne Margarita Diplomate, American Board of Sleep Medicine  ELECTRONICALLY SIGNED ON:  10/28/2014, 6:22 AM [place] PH: [phone]   FX: [phone] [place]

## 2014-10-29 NOTE — Telephone Encounter (Signed)
Left message to call back  

## 2014-10-29 NOTE — Telephone Encounter (Signed)
Patient informed of results and verbal understanding expressed.  CPAP titration ordered for scheduling. Patient agrees with treatment plan.

## 2014-10-29 NOTE — Addendum Note (Signed)
Addended by: Harland German A on: 10/29/2014 01:33 PM   Modules accepted: Orders

## 2014-11-03 ENCOUNTER — Telehealth: Payer: Self-pay | Admitting: Oncology

## 2014-11-03 NOTE — Telephone Encounter (Signed)
pt confirmed appt on 11/25/14 at 1:30pm w/Shadad Dx:  Anemia Referring Dr. Watt Climes

## 2014-11-06 ENCOUNTER — Telehealth: Payer: Self-pay | Admitting: Oncology

## 2014-11-06 NOTE — Telephone Encounter (Signed)
Chart delivered on 11/06/14

## 2014-11-21 ENCOUNTER — Ambulatory Visit (HOSPITAL_BASED_OUTPATIENT_CLINIC_OR_DEPARTMENT_OTHER): Payer: Medicare Other | Attending: Cardiology

## 2014-11-21 ENCOUNTER — Other Ambulatory Visit: Payer: Self-pay | Admitting: Oncology

## 2014-11-21 DIAGNOSIS — G4733 Obstructive sleep apnea (adult) (pediatric): Secondary | ICD-10-CM | POA: Diagnosis present

## 2014-11-21 DIAGNOSIS — D500 Iron deficiency anemia secondary to blood loss (chronic): Secondary | ICD-10-CM

## 2014-11-24 ENCOUNTER — Encounter: Payer: Self-pay | Admitting: *Deleted

## 2014-11-24 NOTE — Progress Notes (Signed)
Left message on answering machine for patient to call me, re: new patient appt tomorrow.

## 2014-11-25 ENCOUNTER — Ambulatory Visit: Payer: Medicare Other

## 2014-11-25 ENCOUNTER — Telehealth: Payer: Self-pay | Admitting: Oncology

## 2014-11-25 ENCOUNTER — Ambulatory Visit (HOSPITAL_BASED_OUTPATIENT_CLINIC_OR_DEPARTMENT_OTHER): Payer: Medicare Other

## 2014-11-25 ENCOUNTER — Ambulatory Visit (HOSPITAL_BASED_OUTPATIENT_CLINIC_OR_DEPARTMENT_OTHER): Payer: Medicare Other | Admitting: Oncology

## 2014-11-25 ENCOUNTER — Telehealth: Payer: Self-pay | Admitting: *Deleted

## 2014-11-25 ENCOUNTER — Encounter: Payer: Self-pay | Admitting: Oncology

## 2014-11-25 VITALS — BP 113/70 | HR 102 | Temp 97.5°F | Resp 18 | Ht 68.0 in | Wt 208.8 lb

## 2014-11-25 DIAGNOSIS — R1013 Epigastric pain: Secondary | ICD-10-CM

## 2014-11-25 DIAGNOSIS — Z8546 Personal history of malignant neoplasm of prostate: Secondary | ICD-10-CM | POA: Diagnosis not present

## 2014-11-25 DIAGNOSIS — D509 Iron deficiency anemia, unspecified: Secondary | ICD-10-CM | POA: Diagnosis not present

## 2014-11-25 DIAGNOSIS — D500 Iron deficiency anemia secondary to blood loss (chronic): Secondary | ICD-10-CM

## 2014-11-25 DIAGNOSIS — K5900 Constipation, unspecified: Secondary | ICD-10-CM

## 2014-11-25 LAB — IRON AND TIBC CHCC
%SAT: 26 % (ref 20–55)
Iron: 120 ug/dL (ref 42–163)
TIBC: 460 ug/dL — ABNORMAL HIGH (ref 202–409)
UIBC: 340 ug/dL (ref 117–376)

## 2014-11-25 LAB — CBC WITH DIFFERENTIAL/PLATELET
BASO%: 1.7 % (ref 0.0–2.0)
Basophils Absolute: 0.1 10*3/uL (ref 0.0–0.1)
EOS ABS: 0.1 10*3/uL (ref 0.0–0.5)
EOS%: 0.8 % (ref 0.0–7.0)
HCT: 35 % — ABNORMAL LOW (ref 38.4–49.9)
HGB: 10.6 g/dL — ABNORMAL LOW (ref 13.0–17.1)
LYMPH#: 1.2 10*3/uL (ref 0.9–3.3)
LYMPH%: 17 % (ref 14.0–49.0)
MCH: 27.5 pg (ref 27.2–33.4)
MCHC: 30.3 g/dL — AB (ref 32.0–36.0)
MCV: 90.9 fL (ref 79.3–98.0)
MONO#: 0.5 10*3/uL (ref 0.1–0.9)
MONO%: 7.6 % (ref 0.0–14.0)
NEUT#: 5.3 10*3/uL (ref 1.5–6.5)
NEUT%: 72.9 % (ref 39.0–75.0)
Platelets: 364 10*3/uL (ref 140–400)
RBC: 3.85 10*6/uL — ABNORMAL LOW (ref 4.20–5.82)
RDW: 17.5 % — AB (ref 11.0–14.6)
WBC: 7.2 10*3/uL (ref 4.0–10.3)

## 2014-11-25 LAB — COMPREHENSIVE METABOLIC PANEL (CC13)
ALBUMIN: 3.4 g/dL — AB (ref 3.5–5.0)
ALK PHOS: 56 U/L (ref 40–150)
ALT: 10 U/L (ref 0–55)
ANION GAP: 10 meq/L (ref 3–11)
AST: 16 U/L (ref 5–34)
BUN: 20.4 mg/dL (ref 7.0–26.0)
CO2: 23 meq/L (ref 22–29)
Calcium: 9.2 mg/dL (ref 8.4–10.4)
Chloride: 106 mEq/L (ref 98–109)
Creatinine: 1.2 mg/dL (ref 0.7–1.3)
EGFR: 59 mL/min/{1.73_m2} — AB (ref >90)
Glucose: 115 mg/dl (ref 70–140)
POTASSIUM: 3.8 meq/L (ref 3.5–5.1)
SODIUM: 139 meq/L (ref 136–145)
Total Bilirubin: 0.24 mg/dL (ref 0.20–1.20)
Total Protein: 6.6 g/dL (ref 6.4–8.3)

## 2014-11-25 LAB — FERRITIN CHCC: Ferritin: 14 ng/ml — ABNORMAL LOW (ref 22–316)

## 2014-11-25 NOTE — Consult Note (Signed)
Reason for Referral: Iron deficiency anemia.   HPI: 81 year old gentleman with the majority of his life in [place]. He has a history of prostate cancer treated with radiation therapy under the care of Dr. Valere Dross. He follows with Dr. Junious Silk regarding his care. He was in his usual state of health until  2006 when he presented with anemia and required packed red cell transfusions. His hemoglobin remained relatively stable and most recently his hemoglobin drifted down to 8.8 with a white cell count of 8.1 and a platelet count of 316. His MCV was 89.4 and RDW of 16.9. His iron studies in December 2015 showed a ferritin level of 14.8 his iron level was 125 with a saturation of 25%. He is folic acid and L97 levels were all within normal range. Squamous reason: Creatinine liver function tests were all within normal range. He had an extensive GI workup by Dr. Watt Climes including a colonoscopy last year, and endoscopy this year and CT scans without any clear source of bleeding. He has been on oral iron replacement per his report since 2006. Most recently he was instructed to take iron supplements up to 3 times a day. He did have problems with constipation and dyspepsia associated with it. He did report symptoms of fatigue and tiredness. He did report symptoms of exertional dyspnea and wheezing. He does not report any hematochezia or melena. He does not report any hematuria or epistaxis. He did not report any bleeding source. He does not report any headaches, blurry vision, double vision or syncope or seizures. He does not report any chest pain, palpitation or leg edema or orthopnea. He does report wheezing and exertional dyspnea but no cough or hemoptysis. He does not report any nausea, vomiting, abdominal pain, early satiety. He does not report any frequency, urgency or hesitancy. He does not report any skeletal complaints. Rest of his review of systems unremarkable.   Past Medical History  Diagnosis Date  . Anemia    . Prostate cancer   . Basal cell cancer   . Hyperlipidemia   . Restless leg   . GERD (gastroesophageal reflux disease)   . Brain tumor   . History of radiation therapy     back approximately 2010 prostate radiation under care of Dr. Valere Dross   . Hypertension   . Herniated disc     lumbar spine  . OSA (obstructive sleep apnea) 10/28/2014    Moderate with AHI 21/hr  :  Past Surgical History  Procedure Laterality Date  . Joint replacement      right knee replacement  . Bilateral cateract surgery    . Vasectomy    . Tonsillectomy    . Colonoscopy      colon polyp removed  . Craniotomy Right 09/27/2013    Procedure: RIGHT FRONTAL CRANIOTOMY FOR TUMOR RESECTION ;  Surgeon: Consuella Lose, MD;  Location: [facility];  Service: Neurosurgery;  Laterality: Right;  . Prostate biopsy    :   Current outpatient prescriptions:  .  b complex vitamins tablet, Take 1 tablet by mouth daily., Disp: , Rfl:  .  ferrous sulfate 325 (65 FE) MG EC tablet, Take 1 tablet (325 mg total) by mouth daily with breakfast., Disp: 30 tablet, Rfl: 3 .  furosemide (LASIX) 20 MG tablet, Take 20 mg by mouth 2 (two) times daily. , Disp: , Rfl:  .  Omega-3 Fatty Acids (FISH OIL) 1000 MG CAPS, Take 1,000 mg by mouth every morning., Disp: , Rfl:  .  omeprazole (PRILOSEC) 20 MG capsule, Take 1 capsule (20 mg total) by mouth daily., Disp: 30 capsule, Rfl: 1 .  Probiotic Product (PROBIOTIC DAILY PO), Take 1 capsule by mouth daily. , Disp: , Rfl:  .  rOPINIRole (REQUIP) 3 MG tablet, Take 3 mg by mouth., Disp: , Rfl: 3 .  simvastatin (ZOCOR) 40 MG tablet, Take 1 tablet (40 mg total) by mouth every evening., Disp: 30 tablet, Rfl: 1 .  solifenacin (VESICARE) 5 MG tablet, Take 1 tablet (5 mg total) by mouth daily. (Patient taking differently: Take 5 mg by mouth every morning. ), Disp: 30 tablet, Rfl: 1 .  tamsulosin (FLOMAX) 0.4 MG CAPS capsule, Take 1 capsule (0.4 mg total) by mouth daily after supper. (Patient taking  differently: Take 0.4 mg by mouth daily after breakfast. ), Disp: 30 capsule, Rfl: 1 .  valsartan-hydrochlorothiazide (DIOVAN-HCT) 160-12.5 MG per tablet, Take 1 tablet by mouth daily., Disp: , Rfl:  .  vitamin B-12 (CYANOCOBALAMIN) 100 MCG tablet, Take 100 mcg by mouth daily. , Disp: , Rfl: :  Allergies  Allergen Reactions  . Asa [Aspirin] Nausea Only  . Vicodin [Hydrocodone-Acetaminophen] Nausea Only  :  Family History  Problem Relation Age of Onset  . Parkinsonism Mother   . Dementia Father   :  History   Social History  . Marital Status: Married    Spouse Name: Mardene Celeste  . Number of Children: 2  . Years of Education: 12   Occupational History  . Retired, Furniture conservator/restorer    Social History Main Topics  . Smoking status: Never Smoker   . Smokeless tobacco: Former Systems developer    Types: Chili date: 10/08/1973  . Alcohol Use: No  . Drug Use: No  . Sexual Activity: Not Currently   Other Topics Concern  . Not on file   Social History Narrative   Patient is married([name]) lives with wife in [place].  Normally independent of ADLs.   Patient is retired.   Patient has two children.   Patient has a high school education.   Right handed        :  Pertinent items are noted in HPI.  Exam: ECOG 1 Blood pressure 113/70, pulse 102, temperature 97.5 F (36.4 C), temperature source Oral, resp. rate 18, height 5\' 8"  (1.727 m), weight 208 lb 12.8 oz (94.711 kg), SpO2 99 %. General appearance: alert and cooperative Head: Normocephalic, without obvious abnormality Throat: lips, mucosa, and tongue normal; teeth and gums normal Neck: no adenopathy, no carotid bruit and no JVD Back: negative Resp: clear to auscultation bilaterally Chest wall: no tenderness Cardio: regular rate and rhythm, S1, S2 normal, no murmur, click, rub or gallop GI: soft, non-tender; bowel sounds normal; no masses,  no organomegaly Extremities: extremities normal, atraumatic, no cyanosis or edema Pulses:  2+ and symmetric Skin: Skin color, texture, turgor normal. No rashes or lesions Lymph nodes: Cervical, supraclavicular, and axillary nodes normal.    Assessment and Plan:   81 year old gentleman with the following issues:  1. Iron deficiency anemia presenting with a hemoglobin of 8.8, RDW of 16.9 and ferritin level 14.8. These were obtained in February 2016. He does have a history of iron deficiency dating back to 2006. He has been on oral iron supplements with minimum improvement in his symptoms and poor tolerance due to constipation and dyspepsia. History of workup did not reveal any evidence of active blood loss. Options of treatments were discussed today including continuing oral iron with maneuvers to  increase the absorption of his oral iron including vitamin C supplements or citrus juices. Alternatively, I discussed with him the role of intravenous iron at this time in the form of Feraheme. Risks and benefits of both approaches were discussed and complications from [facility] were discussed as well. These include infusion related complications such as arthralgias, myalgias and rarely anaphylaxis.  He is agreeable to proceed with IV iron and I'll recheck his iron stores as well as a CBC today and anticipate a total of 1000 mg infusions but in 2 doses.  I doubt he has other contributing factors to his anemia such as anemia of chronic disease, anemia due to renal insufficiency, MDS, bone marrow failure or others. But certainly we'll continue to monitor these as a possibility down the line.  2. Prostate cancer: He has a remote history treated back with radiation therapy and followed by Dr. Junious Silk.  2. Follow-up: Will be in 3 months to recheck his iron stores and insure adequate replacement.

## 2014-11-25 NOTE — Progress Notes (Signed)
Checked in new pt with no financial concerns at this time.  Pt has Raquel's card for any billing questions or concerns. ° °

## 2014-11-25 NOTE — Telephone Encounter (Signed)
Per staff message and POF I have scheduled appts. Advised scheduler of appts. JMW  

## 2014-11-25 NOTE — Progress Notes (Signed)
Please see consult note.  

## 2014-11-25 NOTE — Telephone Encounter (Signed)
Pt confirmed labs/ov per 03/08 POF, gave pt AVS... KJ, sent msg to add IV Iron, will call pt with D/T

## 2014-11-27 LAB — SPEP & IFE WITH QIG
ALBUMIN ELP: 56.6 % (ref 55.8–66.1)
ALPHA-1-GLOBULIN: 3.6 % (ref 2.9–4.9)
Alpha-2-Globulin: 11.4 % (ref 7.1–11.8)
BETA GLOBULIN: 7.7 % — AB (ref 4.7–7.2)
Beta 2: 5.9 % (ref 3.2–6.5)
Gamma Globulin: 14.8 % (ref 11.1–18.8)
IGM, SERUM: 50 mg/dL (ref 41–251)
IgA: 355 mg/dL (ref 68–379)
IgG (Immunoglobin G), Serum: 1070 mg/dL (ref 650–1600)
Total Protein, Serum Electrophoresis: 6.6 g/dL (ref 6.0–8.3)

## 2014-11-27 LAB — ERYTHROPOIETIN: Erythropoietin: 32 m[IU]/mL — ABNORMAL HIGH (ref 2.6–18.5)

## 2014-11-28 ENCOUNTER — Ambulatory Visit (INDEPENDENT_AMBULATORY_CARE_PROVIDER_SITE_OTHER)
Admission: RE | Admit: 2014-11-28 | Discharge: 2014-11-28 | Disposition: A | Discharge status: Home / Self Care | Payer: Medicare Other | Source: Ambulatory Visit | Attending: Internal Medicine | Admitting: Internal Medicine

## 2014-11-28 ENCOUNTER — Ambulatory Visit (INDEPENDENT_AMBULATORY_CARE_PROVIDER_SITE_OTHER): Payer: Medicare Other | Admitting: Internal Medicine

## 2014-11-28 ENCOUNTER — Encounter: Payer: Self-pay | Admitting: Internal Medicine

## 2014-11-28 DIAGNOSIS — R0602 Shortness of breath: Secondary | ICD-10-CM

## 2014-11-28 NOTE — Patient Instructions (Addendum)
Please remember to go to the x-ray department downstairs for your tests - we will call you with the results when they are available.  Add pepcid ac 20 mg  And  take chlortrimeton (chlorpheniramine) 4 mg  1 -2 at bedtime on a trial basis  GERD (REFLUX)  is an extremely common cause of respiratory symptoms just like yours , many times with no obvious heartburn at all.    It can be treated with medication, but also with lifestyle changes including avoidance of late meals, excessive alcohol, smoking cessation, and avoid fatty foods, chocolate, peppermint, colas, red wine, and acidic juices such as orange juice.  NO MINT OR MENTHOL PRODUCTS SO NO COUGH DROPS  USE SUGARLESS CANDY INSTEAD (Jolley ranchers or Stover's or Life Savers) or even ice chips will also do - the key is to swallow to prevent all throat clearing. NO OIL BASED VITAMINS - use powdered substitutes.  Please remember to go to the   x-ray department downstairs for your tests - we will call you with the results when they are available.     If cough and breathing better to your satisfaction then no need to return

## 2014-11-28 NOTE — Assessment & Plan Note (Addendum)
11/28/2014  Walked RA x 3 laps @ 185 ft each stopped due to  End of study, nl pace, min sob   Symptoms are markedly disproportionate to objective findings and not clear this is a lung problem but pt does appear to have difficult airway management issues. DDX of  difficult airways management all start with A and  include Adherence, Ace Inhibitors, Acid Reflux, Active Sinus Disease, Alpha 1 Antitripsin deficiency, Anxiety masquerading as Airways dz,  ABPA,  allergy(esp in young), Aspiration (esp in elderly), Adverse effects of DPI,  Active smokers, plus two Bs  = Bronchiectasis and Beta blocker use..and one C= CHF   ? Acid (or non-acid) GERD > always difficult to exclude as up to 75% of pts in some series report no assoc GI/ Heartburn symptoms and he has a very large HH on cxr > rec max (24h)  acid suppression and diet restrictions/ reviewed and instructions given in writing.   ? Active sinus / rhinitis > try add 1st gen H1 and then get sinus CT later if still having problems   His prognosis would appear to be very good based on today's walking study so we can see him back here prn if cough or sob don't improve on rx

## 2014-11-28 NOTE — Progress Notes (Signed)
Subjective:    Patient ID: [name], male    DOB: 30-Oct-1930,    MRN: [id number]  HPI  49 yowm never smoker with "drainage all his life" with cough / sob late  oct 2015 referred to pulmonary clinic 11/28/2014 by Dr Melinda Crutch    11/28/2014 1st [facility] office visit/ [name]   Chief Complaint  Patient presents with  . Pulmonary Consult    Referred by Dr. Melinda Crutch. Pt c/o SOB and cough for the past 6-8 months. He states that he gets out of breath walking approx 50 ft. Cough is non prod. He notices wheezing in the evening.   cough onset Oct 2015 worse after lie down but not usually waking him up / noisy breathing at hs per wife  Onset was indolent, pattern is persistent nightly but does not bring up excess/ purulent mucus Sob onset was about the same time as cough but denies cough with ex which is limited to only 50 ft but more fatigue related than truly tachypnea and better since rx for anemia  No obvious other patterns in day to day or daytime variabilty or assoc  cp or chest tightness, subjective wheeze overt sinus or hb symptoms. No unusual exp hx or h/o childhood pna/ asthma or knowledge of premature birth.  Sleeping ok without nocturnal  or early am exacerbation  of respiratory  c/o's or need for noct saba. Also denies any obvious fluctuation of symptoms with weather or environmental changes or other aggravating or alleviating factors except as outlined above   Current Medications, Allergies, Complete Past Medical History, Past Surgical History, Family History, and Social History were reviewed in [employer] record.             Review of Systems  Constitutional: Negative for fever, chills, activity change, appetite change and unexpected weight change.  HENT: Negative for congestion, dental problem, postnasal drip, rhinorrhea, sneezing, sore throat, trouble swallowing and voice change.   Eyes: Negative for visual disturbance.  Respiratory: Positive  for cough and shortness of breath. Negative for choking.   Cardiovascular: Positive for leg swelling. Negative for chest pain.  Gastrointestinal: Negative for nausea, vomiting and abdominal pain.  Genitourinary: Negative for difficulty urinating.  Musculoskeletal: Positive for arthralgias.  Skin: Negative for rash.  Psychiatric/Behavioral: Negative for behavioral problems and confusion.       Objective:   Physical Exam amb wm nad   Wt Readings from Last 3 Encounters:  11/28/14 209 lb 12.8 oz (95.165 kg)  11/25/14 208 lb 12.8 oz (94.711 kg)  10/23/14 210 lb (95.255 kg)    Vital signs reviewed  HEENT: nl dentition, turbinates, and orophanx. Nl external ear canals without cough reflex   NECK :  without JVD/Nodes/TM/ nl carotid upstrokes bilaterally   LUNGS: no acc muscle use, clear to A and P bilaterally without cough on insp or exp maneuvers   CV:  RRR  no s3 or murmur or increase in P2, no edema   ABD:  soft and nontender with nl excursion in the supine position. No bruits or organomegaly, bowel sounds nl  MS:  warm without deformities, calf tenderness, cyanosis or clubbing  SKIN: warm and dry without lesions    NEURO:  alert, approp, no deficits   CXR PA and Lateral:   11/28/2014 :     I personally reviewed images and agree with radiology impression as follows:   No acute cardiopulmonary abnormality. Chronic moderate to large hiatal hernia.  Assessment & Plan:

## 2014-11-30 ENCOUNTER — Telehealth: Payer: Self-pay | Admitting: Cardiology

## 2014-11-30 NOTE — Addendum Note (Signed)
Addended by: Fransico Him R on: 11/30/2014 04:41 PM   Modules accepted: Orders

## 2014-11-30 NOTE — Sleep Study (Signed)
   NAME: [name] DATE OF BIRTH:  [birth year] MEDICAL RECORD NUMBER [id number]  LOCATION: [facility]  PHYSICIAN: Hafsah Hendler R  DATE OF STUDY: 11/21/2014  SLEEP STUDY TYPE: Positive Airway Pressure Titration               REFERRING PHYSICIAN: Sueanne Margarita, MD  INDICATION FOR STUDY: Obstructive sleep apnea  EPWORTH SLEEPINESS SCORE:   HEIGHT:    WEIGHT:      There is no weight on file to calculate BMI.  NECK SIZE: 17 in.  MEDICATIONS: Reviewed in the chart  SLEEP ARCHITECTURE: The patient slept for a total of 305 minutes out of a total sleep period of 363 minutes.  There was no slow wave sleep and 70 minutes of REM sleep.  The onset to sleep latency was normal at 10 minutes and onset to REM sleep latency was normal at 90 minutes.  The sleep efficiency was reduced at 81%.  There was an increase in spontaneous arousals with an arousal index of 5.1 arousals per hour.  RESPIRATORY DATA: The patient was started on CPAP at 4cm H2O and pressure was titrated for respiratory events and snoring to 12cm H2O.  The patient was able to achieve REM sleep but unable to maintain the supine position.  The patient was able to remain at optimum pressure for prolonged periods of time without further respiratory events.  The AHI at 12cm H2O was 5.3 events per hour.    OXYGEN DATA: The lowest oxygen saturation noted during the CPAP titration was 90%.   CARDIAC DATA: The patient maintained NSR with PAC's.  The average HR was 65bpm with a minimum heart rate of 53bpm and a maximum heart rate of 125bpm.    MOVEMENT/PARASOMNIA: There were no periodic limb movements or REM behavior sleep disorders.  IMPRESSION/ RECOMMENDATION:   1.  Successful CPAP titration to 12cm H2O. The patient was able to achieve REM sleep but unable to maintain the supine position.  The patient was able to remain at optimum pressure for prolonged periods of time without further respiratory events.  The AHI at 12cm  H2O was 5.3 events per hour. 2.  Recommend ResMed CPAP at 12cm H2O and heated humidifier, medium [employer] Simplus Full Face mask. 3.  The patient should be counseled in good sleep hygiene and weight loss. 4.  The patient should be counseled to avoid sleeping in the supine position.  Signed: Sueanne Margarita Diplomate, American Board of Sleep Medicine  ELECTRONICALLY SIGNED ON:  11/30/2014, 4:30 PM [place] PH: [phone]   FX: [phone] [place]

## 2014-11-30 NOTE — Telephone Encounter (Signed)
Please let patient know that he had successful CPAP titration and set up OV with me in 10 weeks.  Please let [facility] know that order is in Epic.

## 2014-12-01 ENCOUNTER — Ambulatory Visit (HOSPITAL_BASED_OUTPATIENT_CLINIC_OR_DEPARTMENT_OTHER): Payer: Medicare Other

## 2014-12-01 DIAGNOSIS — D509 Iron deficiency anemia, unspecified: Secondary | ICD-10-CM

## 2014-12-01 MED ORDER — SODIUM CHLORIDE 0.9 % IV SOLN
Freq: Once | INTRAVENOUS | Status: AC
  Administered 2014-12-01: 13:00:00 via INTRAVENOUS

## 2014-12-01 MED ORDER — SODIUM CHLORIDE 0.9 % IV SOLN
510.0000 mg | Freq: Once | INTRAVENOUS | Status: AC
  Administered 2014-12-01: 510 mg via INTRAVENOUS
  Filled 2014-12-01: qty 17

## 2014-12-01 NOTE — Progress Notes (Signed)
Quick Note:  Spoke with pt and notified of results per Dr. Wert. Pt verbalized understanding and denied any questions.  ______ 

## 2014-12-01 NOTE — Patient Instructions (Signed)

## 2014-12-03 NOTE — Telephone Encounter (Signed)
Pt. Is aware of results and follow-up appointment was made for 02/13/15. Orders are in, and message sent to [facility].

## 2014-12-08 ENCOUNTER — Ambulatory Visit (HOSPITAL_BASED_OUTPATIENT_CLINIC_OR_DEPARTMENT_OTHER): Payer: Medicare Other

## 2014-12-08 DIAGNOSIS — D509 Iron deficiency anemia, unspecified: Secondary | ICD-10-CM

## 2014-12-08 MED ORDER — FERUMOXYTOL INJECTION 510 MG/17 ML
510.0000 mg | Freq: Once | INTRAVENOUS | Status: AC
  Administered 2014-12-08: 510 mg via INTRAVENOUS
  Filled 2014-12-08: qty 17

## 2014-12-08 MED ORDER — SODIUM CHLORIDE 0.9 % IV SOLN: Freq: Once | INTRAVENOUS | Status: DC

## 2014-12-08 NOTE — Patient Instructions (Signed)

## 2015-01-07 ENCOUNTER — Other Ambulatory Visit: Payer: Self-pay | Admitting: Gastroenterology

## 2015-01-13 ENCOUNTER — Ambulatory Visit
Admission: RE | Admit: 2015-01-13 | Discharge: 2015-01-13 | Disposition: A | Discharge status: Home / Self Care | Payer: Medicare Other | Source: Ambulatory Visit | Attending: Radiation Oncology | Admitting: Radiation Oncology

## 2015-01-13 ENCOUNTER — Other Ambulatory Visit: Payer: Self-pay | Admitting: Radiation Therapy

## 2015-01-13 ENCOUNTER — Telehealth: Payer: Self-pay | Admitting: Radiation Oncology

## 2015-01-13 DIAGNOSIS — D329 Benign neoplasm of meninges, unspecified: Secondary | ICD-10-CM | POA: Insufficient documentation

## 2015-01-13 LAB — BUN AND CREATININE (CC13)
BUN: 15.6 mg/dL (ref 7.0–26.0)
Creatinine: 1.1 mg/dL (ref 0.7–1.3)
EGFR: 60 mL/min/{1.73_m2} — ABNORMAL LOW (ref >90)

## 2015-01-13 NOTE — Telephone Encounter (Signed)
Phoned patient and confirmed labs, MRI and follow up appointment. Patient expressed great appreciation for the quick response by Dr. Tammi Klippel, Manuela Schwartz and myself.

## 2015-01-20 ENCOUNTER — Ambulatory Visit
Admission: RE | Admit: 2015-01-20 | Discharge: 2015-01-20 | Disposition: A | Discharge status: Home / Self Care | Payer: Medicare Other | Source: Ambulatory Visit | Attending: Radiation Oncology | Admitting: Radiation Oncology

## 2015-01-20 ENCOUNTER — Telehealth: Payer: Self-pay | Admitting: *Deleted

## 2015-01-20 DIAGNOSIS — D329 Benign neoplasm of meninges, unspecified: Secondary | ICD-10-CM

## 2015-01-20 MED ORDER — GADOBENATE DIMEGLUMINE 529 MG/ML IV SOLN
19.0000 mL | Freq: Once | INTRAVENOUS | Status: AC | PRN
  Administered 2015-01-20: 19 mL via INTRAVENOUS

## 2015-01-20 NOTE — Telephone Encounter (Signed)
Results of MRI Brain called over by Rosanne Gutting radiology imaging, e-mailed Dr. Cheron Schaumann results and printed copy and placed on his desk,informed his nurse Samantha Presnell RN,thanked Cassandra 1:10 PM

## 2015-01-26 ENCOUNTER — Encounter: Payer: Self-pay | Admitting: Radiation Oncology

## 2015-01-26 ENCOUNTER — Ambulatory Visit
Admission: RE | Admit: 2015-01-26 | Discharge: 2015-01-26 | Disposition: A | Discharge status: Home / Self Care | Payer: Medicare Other | Source: Ambulatory Visit | Attending: Radiation Oncology | Admitting: Radiation Oncology

## 2015-01-26 VITALS — BP 166/81 | HR 72 | Resp 16 | Wt 203.2 lb

## 2015-01-26 DIAGNOSIS — D329 Benign neoplasm of meninges, unspecified: Secondary | ICD-10-CM

## 2015-01-26 NOTE — Progress Notes (Signed)
Radiation Oncology         [phone]     Name: [name]  Date: 06/30/2014  MRN: [id number]  DOB: [birth year]    Multidisciplinary [facility] Sp[facility] Clinic Follow-Up Visit Note  CC:  Melinda Crutch, MD Melinda Crutch, MD  Diagnosis: 81 yo man with recurrent grade II meningioma of the right frontoparietal convexity s/p fractionated stereotactic radiotherapy to 25 Gy in 5 fractions of 5 Gy through 05/29/2014   Interval Since Last Radiation:  7  months   Narrative:  The patient returns today for routine follow-up.  Reports intermittent numbness of left upper lip. Reports he plans to see Dr. Lovenia Shuck soon for an injection to manage low back pain. Reports he often feels off balance. Denies dizziness, headache, nausea or vomiting. Reports chronic ringing of the ears continues. Denies diplopia. Edema of lower extremities has resolved. Recently had a colonscopy which proved no abnormalities. Reports numbness in the fingers of the left hand. Reports intermittent tremor in left arm. Reports his left arm will occasionally raise up on it's own. Reports difficulty gripping objects with his left hand. Reports his left leg feels as though it has a brick tied to it. Reports one seizure since follow up in December.                ALLERGIES:  is allergic to asa and vicodin.  Meds: Current Outpatient Prescriptions  Medication Sig Dispense Refill  . b complex vitamins tablet Take 1 tablet by mouth daily.    . [employer] GENTLE LAXATIVE 5 MG EC tablet   0  . ferrous sulfate 325 (65 FE) MG tablet Take 325 mg by mouth 4 (four) times daily.    . Omega-3 Fatty Acids (FISH OIL) 1000 MG CAPS Take 1,000 mg by mouth every morning.    Marland Kitchen omeprazole (PRILOSEC) 20 MG capsule Take 1 capsule (20 mg total) by mouth daily. 30 capsule 1  . Probiotic Product (PROBIOTIC DAILY PO) Take 1 capsule by mouth daily.     Marland Kitchen rOPINIRole (REQUIP) 3 MG tablet Take 3 mg by mouth at bedtime.   3  . simvastatin (ZOCOR)  40 MG tablet Take 1 tablet (40 mg total) by mouth every evening. 30 tablet 1  . solifenacin (VESICARE) 5 MG tablet Take 1 tablet (5 mg total) by mouth daily. (Patient taking differently: Take 5 mg by mouth every morning. ) 30 tablet 1  . tamsulosin (FLOMAX) 0.4 MG CAPS capsule Take 1 capsule (0.4 mg total) by mouth daily after supper. (Patient taking differently: Take 0.4 mg by mouth daily after breakfast. ) 30 capsule 1  . valsartan-hydrochlorothiazide (DIOVAN-HCT) 160-12.5 MG per tablet Take 1 tablet by mouth daily.    . vitamin B-12 (CYANOCOBALAMIN) 100 MCG tablet Take 100 mcg by mouth daily.     . furosemide (LASIX) 20 MG tablet Take 20 mg by mouth 2 (two) times daily.     . polyethylene glycol-electrolytes (NULYTELY/GOLYTELY) 420 G solution   0   No current facility-administered medications for this encounter.    Physical Findings: The patient is in no acute distress. Patient is alert and oriented.  weight is 203 lb 3.2 oz (92.171 kg). His blood pressure is 166/81 and his pulse is 72. His respiration is 16. .  No significant changes.  Lab Findings: Lab Results  Component Value Date   WBC 7.2 11/25/2014   HGB 10.6* 11/25/2014   HCT 35.0* 11/25/2014   MCV 90.9 11/25/2014   PLT  364 11/25/2014    @LASTCHEM @  Radiographic Findings: Mr Jeri Cos XT Contrast  01/20/2015   CLINICAL DATA:  Meningioma, grade 2 status post resection and radiosurgery ending September 2015.  EXAM: MRI HEAD WITHOUT AND WITH CONTRAST  TECHNIQUE: Multiplanar, multiecho pulse sequences of the brain and surrounding structures were obtained without and with intravenous contrast.  CONTRAST:  61mL MULTIHANCE GADOBENATE DIMEGLUMINE 529 MG/ML IV SOLN  COMPARISON:  08/20/2014  FINDINGS: Calvarium and upper cervical spine: Stable appearance of remote right parietal craniotomy with chronic fluid accumulation below the bone flap. Meningioma may infiltrate the craniotomy at the level of the superior plates, stable from previous.   Orbits: Bilateral cataract resection.  Sinuses: Clear. Mastoid and middle ears are clear.  Brain: Extra-axial mass in the posterior right parietal region consistent with residual meningioma. The mass has enlarged from previous, with maximal diameter 58 x 41 x 21 mm (previously 42 x 31 x 24 mm). Along the anterior margin of the mass there is a cap of ill-defined enhancement concerning for brain enhancement and invasion. There is a commensurate increase in T2 and FLAIR hyperintensity in this region, likely cerebral edema as there is slight mass effect on the splenium of the corpus callosum. Chronic non filling of the superior sagittal sinus anterior due to approximately 3 cm long mass. No acute or neighboring venous thrombosis.  No acute infarct, hemorrhage, hydrocephalus, or shift.  These results will be called to the ordering clinician or representative by the Radiologist Assistant, and communication documented in the PACS or zVision Dashboard.  IMPRESSION: 1. Continued growth of the right parietal meningioma with new findings concerning for parenchymal brain invasion. Commensurate increase in local cerebral edema without shift or herniation. 2. Stable tumor in the superior sagittal sinus. Stable probable tumor infiltration of the upper craniotomy.   Electronically Signed   By: Monte Fantasia M.D.   On: 01/20/2015 12:48    Impression:  The patient is recovering from the effects of radiation.  Local reccurrence meningoma with symptoms and swelling. Additional radiation treatment is not recommended at this time due to risks.   Plan:  Refer back to neurosurgery to consider salvage resection.   _____________________________________  Sheral Apley Tammi Klippel, M.D.  This document serves as a record of services personally performed by Tyler Pita, MD. It was created on his behalf by Derek Mound, a trained medical scribe. The creation of this record is based on the scribe's personal observations and the  provider's statements to them. This document has been checked and approved by the attending provider.

## 2015-01-26 NOTE — Progress Notes (Signed)
Reports intermittent numbness of left upper lip. Reports he plans to see Dr. Lovenia Shuck soon for an injection to manage low back pain. Reports he often feels off balance. Denies dizziness, headache, nausea or vomiting. Reports chronic ringing of the ears continues. Denies diplopia. Edema of lower extremities has resolved. Recently had a colonscopy which proved no abnormalities. Reports numbness in the fingers of the left hand. Reports intermittent trimmer in left arm. Reports his left arm will occasionally raise up on it's own. Reports difficulty gripping objects with his left hand. Reports his left leg feels as though it has a brick tied to it. Reports one seizure since follow up in December.

## 2015-02-02 ENCOUNTER — Other Ambulatory Visit: Payer: Self-pay | Admitting: Neurosurgery

## 2015-02-10 ENCOUNTER — Encounter (HOSPITAL_COMMUNITY): Payer: Self-pay

## 2015-02-10 ENCOUNTER — Encounter (HOSPITAL_COMMUNITY)
Admission: RE | Admit: 2015-02-10 | Discharge: 2015-02-10 | Disposition: A | Discharge status: Home / Self Care | Payer: Medicare Other | Source: Ambulatory Visit | Attending: Neurosurgery | Admitting: Neurosurgery

## 2015-02-10 HISTORY — DX: Diaphragmatic hernia without obstruction or gangrene: K44.9

## 2015-02-10 HISTORY — DX: Nocturia: R35.1

## 2015-02-10 HISTORY — DX: Benign neoplasm of meninges, unspecified: D32.9

## 2015-02-10 HISTORY — DX: Other fecal abnormalities: R19.5

## 2015-02-10 LAB — CBC
HEMATOCRIT: 43.9 % (ref 39.0–52.0)
Hemoglobin: 14.6 g/dL (ref 13.0–17.0)
MCH: 30.8 pg (ref 26.0–34.0)
MCHC: 33.3 g/dL (ref 30.0–36.0)
MCV: 92.6 fL (ref 78.0–100.0)
PLATELETS: 289 10*3/uL (ref 150–400)
RBC: 4.74 MIL/uL (ref 4.22–5.81)
RDW: 14.7 % (ref 11.5–15.5)
WBC: 9.6 10*3/uL (ref 4.0–10.5)

## 2015-02-10 LAB — BASIC METABOLIC PANEL
Anion gap: 9 (ref 5–15)
BUN: 14 mg/dL (ref 6–20)
CO2: 23 mmol/L (ref 22–32)
CREATININE: 1.01 mg/dL (ref 0.61–1.24)
Calcium: 9.3 mg/dL (ref 8.9–10.3)
Chloride: 103 mmol/L (ref 101–111)
GFR calc Af Amer: >60 mL/min (ref >60)
GFR calc non Af Amer: >60 mL/min (ref >60)
GLUCOSE: 113 mg/dL — AB (ref 65–99)
Potassium: 3.6 mmol/L (ref 3.5–5.1)
Sodium: 135 mmol/L (ref 135–145)

## 2015-02-10 LAB — TYPE AND SCREEN
ABO/RH(D): O POS
ANTIBODY SCREEN: NEGATIVE

## 2015-02-10 NOTE — Pre-Procedure Instructions (Signed)
[name]  02/10/2015       Your procedure is scheduled on May 31.  Report to [facility] Admitting at 08:00 A.M.  Call this number if you have problems the morning of surgery:  [phone]   Remember:  Do not eat food or drink liquids after midnight.  Take these medicines the morning of surgery with A SIP OF WATER: dexamethasone, omeprazole, vesicare, keppra   STOP B12, B- complex, Probiotic, Magnesium today   STOP/ Do not take Aspirin, Aleve, Naproxen, Advil, Ibuprofen, Motrin, Vitamins, Herbs, or Supplements starting today   Do not wear jewelry, make-up or nail polish.  Do not wear lotions, powders, or perfumes.  You may wear deodorant.  Do not shave 48 hours prior to surgery.  Men may shave face and neck.  Do not bring valuables to the hospital.  [facility] is not responsible for any belongings or valuables.  Contacts, dentures or bridgework may not be worn into surgery.  Leave your suitcase in the car.  After surgery it may be brought to your room.  For patients admitted to the hospital, discharge time will be determined by your treatment team.  Patients discharged the day of surgery will not be allowed to drive home.   [facility] - Preparing for Surgery  Before surgery, you can play an important role.  Because skin is not sterile, your skin needs to be as free of germs as possible.  You can reduce the number of germs on you skin by washing with CHG (chlorahexidine gluconate) soap before surgery.  CHG is an antiseptic cleaner which kills germs and bonds with the skin to continue killing germs even after washing.  Please DO NOT use if you have an allergy to CHG or antibacterial soaps.  If your skin becomes reddened/irritated stop using the CHG and inform your nurse when you arrive at Short Stay.  Do not shave (including legs and underarms) for at least 48 hours prior to the first CHG shower.  You may shave your face.  Please follow these instructions  carefully:   1.  Shower with CHG Soap the night before surgery and the morning of Surgery.  2.  If you choose to wash your hair, wash your hair first as usual with your normal shampoo.  3.  After you shampoo, rinse your hair and body thoroughly to remove the shampoo.  4.  Use CHG as you would any other liquid soap.  You can apply CHG directly to the skin and wash gently with scrungie or a clean washcloth.  5.  Apply the CHG Soap to your body ONLY FROM THE NECK DOWN.  Do not use on open wounds or open sores.  Avoid contact with your eyes, ears, mouth and genitals (private parts).  Wash genitals (private parts) with your normal soap.  6.  Wash thoroughly, paying special attention to the area where your surgery will be performed.  7.  Thoroughly rinse your body with warm water from the neck down.  8.  DO NOT shower/wash with your normal soap after using and rinsing off the CHG Soap.  9.  Pat yourself dry with a clean towel.            10.  Wear clean pajamas.            11.  Place clean sheets on your bed the night of your first shower and do not sleep with pets.  Day of Surgery  Do not apply any lotions the morning of surgery.  Please wear clean clothes to the hospital/surgery center.    Please read over the following fact sheets that you were given. Pain Booklet, Coughing and Deep Breathing, Blood Transfusion Information and Surgical Site Infection Prevention

## 2015-02-10 NOTE — Progress Notes (Signed)
Anesthesia Chart Review:  Pt is 81 year old male scheduled for R frontal/parietal craniotomy for resection of meningioma on 02/17/2015 with Dr. Kathyrn Sheriff.   Cardiologist is Dr. Fransico Him who also follows pt for OSA; has f/u appt for OSA on 02/13/2015. PCP is Dr. Melinda Crutch.   PMH includes: HTN, OSA (on CPAP), hyperlipidemia, anemia, meningioma, GERD. Never smoker. BMI 31. S/p R frontal craniotomy 09/27/13.   Preoperative labs reviewed.    Chest x-ray 11/28/2014 reviewed. No acute cardiopulmonary abnormality. Chronic moderate to large hiatal hernia.   EKG 11/12/215: NSR.   Echo 08/08/2014: - Left ventricle: The cavity size was normal. There was mild concentric hypertrophy. Systolic function was normal. The estimated ejection fraction was in the range of 55% to 60%. Wall motion was normal; there were no regional wall motion abnormalities. Features are consistent with a pseudonormal left ventricular filling pattern, with concomitant abnormal relaxation and increased filling pressure (grade 2 diastolic dysfunction). - Aortic valve: There was aortic regurgitation. This is poorly demonstrated on color Doppler, but is likely moderate based on spectral Doppler findings.  Nuclear stress test 08/08/2014: Low risk stress nuclear study with no ischemia identified. LV Ejection Fraction: 76%. LV Wall Motion: NL LV Function; NL Wall Motion.   If no changes, I anticipate pt can proceed with surgery as scheduled.   Willeen Cass, FNP-BC [facility] Short Stay Surgical Center/Anesthesiology Phone: [phone] 02/10/2015 4:27 PM

## 2015-02-10 NOTE — Progress Notes (Signed)
Patient denies CP/ Shob. Patient reports that he has a follow- up appointment with Dr. Radford Pax on 02/13/15. Report stress test 2015 in Epic. PCP Melinda Crutch at [place].

## 2015-02-13 ENCOUNTER — Encounter: Payer: Self-pay | Admitting: Cardiology

## 2015-02-13 ENCOUNTER — Ambulatory Visit (INDEPENDENT_AMBULATORY_CARE_PROVIDER_SITE_OTHER): Payer: Medicare Other | Admitting: Cardiology

## 2015-02-13 VITALS — BP 132/60 | HR 73 | Ht 66.0 in | Wt 195.8 lb

## 2015-02-13 DIAGNOSIS — I10 Essential (primary) hypertension: Secondary | ICD-10-CM | POA: Diagnosis not present

## 2015-02-13 DIAGNOSIS — G4733 Obstructive sleep apnea (adult) (pediatric): Secondary | ICD-10-CM

## 2015-02-13 DIAGNOSIS — E669 Obesity, unspecified: Secondary | ICD-10-CM | POA: Diagnosis not present

## 2015-02-13 NOTE — Progress Notes (Signed)
Cardiology Office Note   Date:  02/13/2015   ID:  [name], DOB 1931-03-01, MRN [id number]  PCP:   Melinda Crutch, MD    Chief Complaint  Patient presents with  . Follow-up    OSA      History of Present Illness:  This is an 81yo male with a history of GERD, HTN and prostate CA who presents for followup of fatigue and SOB.  A nuclear stress test showed no ischemia and echo showed normal LVF with moderate AI.  He had a sleep study showing moderate OSA with AHI 21/hr and underwent CPAP titration to 12cm H2O.  He is doing well with his CPAP device but has had trouble with a hissing noise with his machine.  He tolerates the full face mask but it has been leaking and his DME is getting him a new one.  He feels the pressure is adequate.  He feels rested in the am but still gets sleepy some during the afternoon. He goes to bed at 9pm and gets up around 3:30am.  He has gotten up at 4am for years due to his work in the past.  His SOB has completely resolved.     Past Medical History  Diagnosis Date  . Anemia   . Prostate cancer   . Basal cell cancer   . Hyperlipidemia   . Restless leg   . GERD (gastroesophageal reflux disease)   . Brain tumor   . History of radiation therapy     back approximately 2010 prostate radiation under care of Dr. Valere Dross   . Hypertension   . Herniated disc     lumbar spine  . OSA (obstructive sleep apnea) 10/28/2014    Moderate with AHI 21/hr  . Meningioma     brain  . Hiatal hernia   . Occult blood in stools   . Nocturia     Past Surgical History  Procedure Laterality Date  . Joint replacement      right knee replacement  . Bilateral cateract surgery    . Vasectomy    . Colonoscopy      colon polyp removed  . Craniotomy Right 09/27/2013    Procedure: RIGHT FRONTAL CRANIOTOMY FOR TUMOR RESECTION ;  Surgeon: Consuella Lose, MD;  Location: [facility];  Service: Neurosurgery;  Laterality: Right;  . Prostate biopsy    . Tonsillectomy      Agae  6-7     Current Outpatient Prescriptions  Medication Sig Dispense Refill  . acetaminophen (TYLENOL) 500 MG tablet Take 1,000 mg by mouth every 8 (eight) hours as needed (pain).    Marland Kitchen b complex vitamins tablet Take 1 tablet by mouth daily.    . chlorpheniramine (CHLOR-TRIMETON) 4 MG tablet Take 4 mg by mouth daily.    Marland Kitchen dexamethasone (DECADRON) 2 MG tablet Take 2 mg by mouth 2 (two) times daily with a meal.    . famotidine (PEPCID) 20 MG tablet Take 20 mg by mouth at bedtime.    . ferrous sulfate 325 (65 FE) MG tablet Take 650 mg by mouth 2 (two) times daily with a meal.     . levETIRAcetam (KEPPRA) 500 MG tablet Take 500 mg by mouth 2 (two) times daily.    . magnesium gluconate (MAGONATE) 500 MG tablet Take 500 mg by mouth daily.    Marland Kitchen omeprazole (PRILOSEC) 20 MG capsule Take 1 capsule (20 mg total) by mouth daily. 30 capsule 1  . Probiotic Product (  PROBIOTIC DAILY PO) Take 1 capsule by mouth daily.     Marland Kitchen rOPINIRole (REQUIP) 3 MG tablet Take 3 mg by mouth at bedtime. Pt takes 1 hour before bedtime  3  . simvastatin (ZOCOR) 40 MG tablet Take 1 tablet (40 mg total) by mouth every evening. 30 tablet 1  . solifenacin (VESICARE) 5 MG tablet Take 1 tablet (5 mg total) by mouth daily. (Patient taking differently: Take 5 mg by mouth every morning. ) 30 tablet 1  . tamsulosin (FLOMAX) 0.4 MG CAPS capsule Take 1 capsule (0.4 mg total) by mouth daily after supper. (Patient taking differently: Take 0.4 mg by mouth daily after breakfast. ) 30 capsule 1  . valsartan-hydrochlorothiazide (DIOVAN-HCT) 160-12.5 MG per tablet Take 1 tablet by mouth daily.    . vitamin B-12 (CYANOCOBALAMIN) 100 MCG tablet Take 100 mcg by mouth daily.      No current facility-administered medications for this visit.    Allergies:   Asa and Vicodin    Social History:  The patient  reports that he has never smoked. He quit smokeless tobacco use about 41 years ago. His smokeless tobacco use included Chew. He reports that he does  not drink alcohol or use illicit drugs.   Family History:  The patient's family history includes Dementia in his father; Parkinsonism in his mother.    ROS:  Please see the history of present illness.   Otherwise, review of systems are positive for none.   All other systems are reviewed and negative.    PHYSICAL EXAM: VS:  BP 132/60 mmHg  Pulse 73  Ht 5\' 6"  (1.676 m)  Wt 195 lb 12.8 oz (88.814 kg)  BMI 31.62 kg/m2  SpO2 96% , BMI Body mass index is 31.62 kg/(m^2). GEN: Well nourished, well developed, in no acute distress HEENT: normal Neck: no JVD, carotid bruits, or masses Cardiac: RRR; no murmurs, rubs, or gallops,no edema  Respiratory:  clear to auscultation bilaterally, normal work of breathing GI: soft, nontender, nondistended, + BS MS: no deformity or atrophy Skin: warm and dry, no rash Neuro:  Strength and sensation are intact Psych: euthymic mood, full affect   EKG:  EKG is not ordered today.    Recent Labs: 07/31/2014: Pro B Natriuretic peptide (BNP) 70.2; TSH 1.610 11/25/2014: ALT 10 02/10/2015: BUN 14; Creatinine 1.01; Hemoglobin 14.6; Platelets 289; Potassium 3.6; Sodium 135    Lipid Panel No results found for: CHOL, TRIG, HDL, CHOLHDL, VLDL, LDLCALC, LDLDIRECT    Wt Readings from Last 3 Encounters:  02/13/15 195 lb 12.8 oz (88.814 kg)  11/28/14 209 lb 12.8 oz (95.165 kg)  11/25/14 208 lb 12.8 oz (94.711 kg)      ASSESSMENT AND PLAN:  1. SOB of unclear etiology.  This has resolved.  He has no CP.  Nuclear stress test was normal 2. Exertional fatigue secondary to OSA.  He was found to have moderate OSA with an AHI of 21/hr and is now on CPAP at 12cm H2O.    3. HTN well controlled - continue Valsartan HCT 4. Chronic LE edema - continue diuretics 5.  Moderate AI on echo - repeat 07/2015 6.  Moderate OSA on CPAP - he is getting a new mask so I will get a d/l in 4 weeks.    Current medicines are reviewed at length with the patient today.  The patient  does not have concerns regarding medicines.  The following changes have been made:  no change  Labs/ tests ordered today  include: see above assessment and plan No orders of the defined types were placed in this encounter.     Disposition:   FU with me in 6 months   Signed, Sueanne Margarita, MD  02/13/2015 3:28 PM    [facility] [address], [place], [place]  [address] Phone: [phone]; Fax: [phone]

## 2015-02-13 NOTE — Patient Instructions (Signed)
Medication Instructions:  Your physician recommends that you continue on your current medications as directed. Please refer to the Current Medication list given to you today.   Labwork: None  Testing/Procedures: Your physician has requested that you have an echocardiogram in November, 2016. Echocardiography is a painless test that uses sound waves to create images of your heart. It provides your doctor with information about the size and shape of your heart and how well your heart's chambers and valves are working. This procedure takes approximately one hour. There are no restrictions for this procedure.  Follow-Up: Your physician wants you to follow-up in: 6 months with Dr. Radford Pax. You will receive a reminder letter in the mail two months in advance. If you don't receive a letter, please call our office to schedule the follow-up appointment.   Any Other Special Instructions Will Be Listed Below (If Applicable).

## 2015-02-16 MED ORDER — CEFAZOLIN SODIUM-DEXTROSE 2-3 GM-% IV SOLR
2.0000 g | INTRAVENOUS | Status: AC
  Administered 2015-02-17: 2 g via INTRAVENOUS

## 2015-02-17 ENCOUNTER — Encounter (HOSPITAL_COMMUNITY)
Admission: RE | Disposition: A | Discharge status: Home Health | Payer: Self-pay | Source: Ambulatory Visit | Attending: Neurosurgery

## 2015-02-17 ENCOUNTER — Inpatient Hospital Stay (HOSPITAL_COMMUNITY)
Admission: RE | Admit: 2015-02-17 | Discharge: 2015-02-20 | DRG: 030 | Disposition: A | Discharge status: Home Health | Payer: Medicare Other | Source: Ambulatory Visit | Attending: Neurosurgery | Admitting: Neurosurgery

## 2015-02-17 ENCOUNTER — Inpatient Hospital Stay (HOSPITAL_COMMUNITY): Payer: Medicare Other | Admitting: Anesthesiology

## 2015-02-17 ENCOUNTER — Inpatient Hospital Stay (HOSPITAL_COMMUNITY): Payer: Medicare Other | Admitting: Emergency Medicine

## 2015-02-17 ENCOUNTER — Encounter (HOSPITAL_COMMUNITY): Payer: Self-pay | Admitting: *Deleted

## 2015-02-17 DIAGNOSIS — D320 Benign neoplasm of cerebral meninges: Principal | ICD-10-CM | POA: Diagnosis present

## 2015-02-17 DIAGNOSIS — G4733 Obstructive sleep apnea (adult) (pediatric): Secondary | ICD-10-CM | POA: Diagnosis present

## 2015-02-17 DIAGNOSIS — E785 Hyperlipidemia, unspecified: Secondary | ICD-10-CM | POA: Diagnosis present

## 2015-02-17 DIAGNOSIS — Z79899 Other long term (current) drug therapy: Secondary | ICD-10-CM

## 2015-02-17 DIAGNOSIS — G2581 Restless legs syndrome: Secondary | ICD-10-CM | POA: Diagnosis present

## 2015-02-17 DIAGNOSIS — Z885 Allergy status to narcotic agent status: Secondary | ICD-10-CM

## 2015-02-17 DIAGNOSIS — Z8546 Personal history of malignant neoplasm of prostate: Secondary | ICD-10-CM | POA: Diagnosis not present

## 2015-02-17 DIAGNOSIS — Z85828 Personal history of other malignant neoplasm of skin: Secondary | ICD-10-CM

## 2015-02-17 DIAGNOSIS — Z96651 Presence of right artificial knee joint: Secondary | ICD-10-CM | POA: Diagnosis present

## 2015-02-17 DIAGNOSIS — K219 Gastro-esophageal reflux disease without esophagitis: Secondary | ICD-10-CM | POA: Diagnosis present

## 2015-02-17 DIAGNOSIS — Z886 Allergy status to analgesic agent status: Secondary | ICD-10-CM | POA: Diagnosis not present

## 2015-02-17 DIAGNOSIS — D420 Neoplasm of uncertain behavior of cerebral meninges: Secondary | ICD-10-CM

## 2015-02-17 DIAGNOSIS — Z923 Personal history of irradiation: Secondary | ICD-10-CM | POA: Diagnosis not present

## 2015-02-17 DIAGNOSIS — Z87891 Personal history of nicotine dependence: Secondary | ICD-10-CM | POA: Diagnosis not present

## 2015-02-17 DIAGNOSIS — I10 Essential (primary) hypertension: Secondary | ICD-10-CM | POA: Diagnosis present

## 2015-02-17 DIAGNOSIS — Z791 Long term (current) use of non-steroidal anti-inflammatories (NSAID): Secondary | ICD-10-CM

## 2015-02-17 HISTORY — PX: APPLICATION OF CRANIAL NAVIGATION: SHX6578

## 2015-02-17 HISTORY — PX: CRANIOTOMY: SHX93

## 2015-02-17 LAB — CREATININE, SERUM
Creatinine, Ser: 1.02 mg/dL (ref 0.61–1.24)
GFR calc non Af Amer: >60 mL/min (ref >60)

## 2015-02-17 LAB — CBC
HCT: 40.1 % (ref 39.0–52.0)
Hemoglobin: 13 g/dL (ref 13.0–17.0)
MCH: 30.7 pg (ref 26.0–34.0)
MCHC: 32.4 g/dL (ref 30.0–36.0)
MCV: 94.6 fL (ref 78.0–100.0)
PLATELETS: 206 10*3/uL (ref 150–400)
RBC: 4.24 MIL/uL (ref 4.22–5.81)
RDW: 15.5 % (ref 11.5–15.5)
WBC: 11.4 10*3/uL — ABNORMAL HIGH (ref 4.0–10.5)

## 2015-02-17 SURGERY — CRANIOTOMY TUMOR EXCISION
Anesthesia: General | Site: Head | Laterality: Right

## 2015-02-17 MED ORDER — PROPOFOL 10 MG/ML IV BOLUS
INTRAVENOUS | Status: AC
  Filled 2015-02-17: qty 20

## 2015-02-17 MED ORDER — SENNOSIDES-DOCUSATE SODIUM 8.6-50 MG PO TABS
1.0000 | ORAL_TABLET | Freq: Every evening | ORAL | Status: DC | PRN
  Filled 2015-02-17: qty 1

## 2015-02-17 MED ORDER — HEPARIN SODIUM (PORCINE) 5000 UNIT/ML IJ SOLN
5000.0000 [IU] | Freq: Three times a day (TID) | INTRAMUSCULAR | Status: DC
  Administered 2015-02-18 – 2015-02-20 (×7): 5000 [IU] via SUBCUTANEOUS
  Filled 2015-02-17 (×10): qty 1

## 2015-02-17 MED ORDER — MANNITOL 25 % IV SOLN
INTRAVENOUS | Status: DC | PRN
  Administered 2015-02-17: 25 g via INTRAVENOUS

## 2015-02-17 MED ORDER — FERROUS SULFATE 325 (65 FE) MG PO TABS
650.0000 mg | ORAL_TABLET | Freq: Two times a day (BID) | ORAL | Status: DC
  Administered 2015-02-18 – 2015-02-20 (×5): 650 mg via ORAL
  Filled 2015-02-17 (×7): qty 2

## 2015-02-17 MED ORDER — SODIUM CHLORIDE 0.9 % IV SOLN
500.0000 mg | INTRAVENOUS | Status: AC
  Administered 2015-02-17: 500 mg via INTRAVENOUS
  Filled 2015-02-17 (×2): qty 5

## 2015-02-17 MED ORDER — PROMETHAZINE HCL 25 MG PO TABS: 12.5000 mg | ORAL_TABLET | ORAL | Status: DC | PRN

## 2015-02-17 MED ORDER — LIDOCAINE HCL (CARDIAC) 20 MG/ML IV SOLN
INTRAVENOUS | Status: DC | PRN
  Administered 2015-02-17: 50 mg via INTRAVENOUS

## 2015-02-17 MED ORDER — LACTATED RINGERS IV SOLN
INTRAVENOUS | Status: DC | PRN
  Administered 2015-02-17 (×2): via INTRAVENOUS

## 2015-02-17 MED ORDER — SIMVASTATIN 40 MG PO TABS
40.0000 mg | ORAL_TABLET | Freq: Every evening | ORAL | Status: DC
  Administered 2015-02-17 – 2015-02-19 (×3): 40 mg via ORAL
  Filled 2015-02-17 (×4): qty 1

## 2015-02-17 MED ORDER — PROMETHAZINE HCL 25 MG/ML IJ SOLN: 6.2500 mg | INTRAMUSCULAR | Status: DC | PRN

## 2015-02-17 MED ORDER — DEXAMETHASONE 2 MG PO TABS
2.0000 mg | ORAL_TABLET | Freq: Two times a day (BID) | ORAL | Status: DC
  Administered 2015-02-17 – 2015-02-20 (×6): 2 mg via ORAL
  Filled 2015-02-17 (×8): qty 1

## 2015-02-17 MED ORDER — HYDROMORPHONE HCL 1 MG/ML IJ SOLN
0.5000 mg | INTRAMUSCULAR | Status: DC | PRN
  Administered 2015-02-17: 1 mg via INTRAVENOUS
  Administered 2015-02-18: 0.5 mg via INTRAVENOUS
  Filled 2015-02-17 (×2): qty 1

## 2015-02-17 MED ORDER — TAMSULOSIN HCL 0.4 MG PO CAPS
0.4000 mg | ORAL_CAPSULE | Freq: Every day | ORAL | Status: DC
  Administered 2015-02-17 – 2015-02-19 (×3): 0.4 mg via ORAL
  Filled 2015-02-17 (×4): qty 1

## 2015-02-17 MED ORDER — ONDANSETRON HCL 4 MG/2ML IJ SOLN: 4.0000 mg | INTRAMUSCULAR | Status: DC | PRN

## 2015-02-17 MED ORDER — LACTATED RINGERS IV SOLN
INTRAVENOUS | Status: DC
  Administered 2015-02-17 (×3): via INTRAVENOUS

## 2015-02-17 MED ORDER — FENTANYL CITRATE (PF) 100 MCG/2ML IJ SOLN
INTRAMUSCULAR | Status: DC | PRN
  Administered 2015-02-17: 50 ug via INTRAVENOUS
  Administered 2015-02-17: 100 ug via INTRAVENOUS
  Administered 2015-02-17: 50 ug via INTRAVENOUS

## 2015-02-17 MED ORDER — ROCURONIUM BROMIDE 100 MG/10ML IV SOLN
INTRAVENOUS | Status: DC | PRN
  Administered 2015-02-17: 20 mg via INTRAVENOUS
  Administered 2015-02-17: 10 mg via INTRAVENOUS
  Administered 2015-02-17: 50 mg via INTRAVENOUS

## 2015-02-17 MED ORDER — PHENYLEPHRINE HCL 10 MG/ML IJ SOLN
10.0000 mg | INTRAMUSCULAR | Status: DC | PRN
  Administered 2015-02-17: 20 ug/min via INTRAVENOUS

## 2015-02-17 MED ORDER — FENTANYL CITRATE (PF) 250 MCG/5ML IJ SOLN
INTRAMUSCULAR | Status: AC
  Filled 2015-02-17: qty 5

## 2015-02-17 MED ORDER — LABETALOL HCL 5 MG/ML IV SOLN: 10.0000 mg | INTRAVENOUS | Status: DC | PRN

## 2015-02-17 MED ORDER — IRBESARTAN 150 MG PO TABS
150.0000 mg | ORAL_TABLET | Freq: Every day | ORAL | Status: DC
  Administered 2015-02-17 – 2015-02-20 (×3): 150 mg via ORAL
  Filled 2015-02-17 (×4): qty 1

## 2015-02-17 MED ORDER — VITAMIN B-12 100 MCG PO TABS
100.0000 ug | ORAL_TABLET | Freq: Every day | ORAL | Status: DC
  Administered 2015-02-17 – 2015-02-20 (×4): 100 ug via ORAL
  Filled 2015-02-17 (×4): qty 1

## 2015-02-17 MED ORDER — CEFAZOLIN SODIUM-DEXTROSE 2-3 GM-% IV SOLR
2.0000 g | Freq: Three times a day (TID) | INTRAVENOUS | Status: AC
  Administered 2015-02-17 – 2015-02-18 (×2): 2 g via INTRAVENOUS
  Filled 2015-02-17 (×2): qty 50

## 2015-02-17 MED ORDER — HYDROMORPHONE HCL 1 MG/ML IJ SOLN
0.2500 mg | INTRAMUSCULAR | Status: DC | PRN
  Administered 2015-02-17 (×4): 0.5 mg via INTRAVENOUS

## 2015-02-17 MED ORDER — ONDANSETRON HCL 4 MG PO TABS: 4.0000 mg | ORAL_TABLET | ORAL | Status: DC | PRN

## 2015-02-17 MED ORDER — VALSARTAN-HYDROCHLOROTHIAZIDE 160-12.5 MG PO TABS: 1.0000 | ORAL_TABLET | Freq: Every day | ORAL | Status: DC

## 2015-02-17 MED ORDER — THROMBIN 5000 UNITS EX SOLR
CUTANEOUS | Status: DC | PRN
  Administered 2015-02-17: 5000 [IU] via TOPICAL

## 2015-02-17 MED ORDER — DARIFENACIN HYDROBROMIDE ER 7.5 MG PO TB24
7.5000 mg | ORAL_TABLET | Freq: Every day | ORAL | Status: DC
  Administered 2015-02-17 – 2015-02-20 (×4): 7.5 mg via ORAL
  Filled 2015-02-17 (×4): qty 1

## 2015-02-17 MED ORDER — HYDROMORPHONE HCL 1 MG/ML IJ SOLN
INTRAMUSCULAR | Status: AC
  Filled 2015-02-17: qty 1

## 2015-02-17 MED ORDER — THROMBIN 20000 UNITS EX SOLR
CUTANEOUS | Status: DC | PRN
  Administered 2015-02-17: 20 mL via TOPICAL

## 2015-02-17 MED ORDER — BACITRACIN ZINC 500 UNIT/GM EX OINT
TOPICAL_OINTMENT | CUTANEOUS | Status: DC | PRN
  Administered 2015-02-17: 1 via TOPICAL

## 2015-02-17 MED ORDER — NEOSTIGMINE METHYLSULFATE 10 MG/10ML IV SOLN
INTRAVENOUS | Status: DC | PRN
  Administered 2015-02-17: 4 mg via INTRAVENOUS

## 2015-02-17 MED ORDER — MAGNESIUM GLUCONATE 500 MG PO TABS
500.0000 mg | ORAL_TABLET | Freq: Every day | ORAL | Status: DC
  Administered 2015-02-17 – 2015-02-20 (×4): 500 mg via ORAL
  Filled 2015-02-17 (×4): qty 1

## 2015-02-17 MED ORDER — THROMBIN 5000 UNITS EX SOLR
CUTANEOUS | Status: DC | PRN
  Administered 2015-02-17: 12:00:00 via TOPICAL

## 2015-02-17 MED ORDER — BISACODYL 10 MG RE SUPP: 10.0000 mg | Freq: Every day | RECTAL | Status: DC | PRN

## 2015-02-17 MED ORDER — HYDROCHLOROTHIAZIDE 12.5 MG PO CAPS
12.5000 mg | ORAL_CAPSULE | Freq: Every day | ORAL | Status: DC
  Administered 2015-02-17 – 2015-02-20 (×3): 12.5 mg via ORAL
  Filled 2015-02-17 (×4): qty 1

## 2015-02-17 MED ORDER — 0.9 % SODIUM CHLORIDE (POUR BTL) OPTIME
TOPICAL | Status: DC | PRN
  Administered 2015-02-17 (×3): 1000 mL

## 2015-02-17 MED ORDER — GLYCOPYRROLATE 0.2 MG/ML IJ SOLN
INTRAMUSCULAR | Status: DC | PRN
  Administered 2015-02-17: 0.6 mg via INTRAVENOUS

## 2015-02-17 MED ORDER — EPHEDRINE SULFATE 50 MG/ML IJ SOLN
INTRAMUSCULAR | Status: DC | PRN
  Administered 2015-02-17 (×2): 10 mg via INTRAVENOUS
  Administered 2015-02-17: 5 mg via INTRAVENOUS

## 2015-02-17 MED ORDER — FAMOTIDINE 20 MG PO TABS
20.0000 mg | ORAL_TABLET | Freq: Every day | ORAL | Status: DC
  Administered 2015-02-17 – 2015-02-19 (×3): 20 mg via ORAL
  Filled 2015-02-17: qty 2
  Filled 2015-02-17 (×3): qty 1

## 2015-02-17 MED ORDER — PANTOPRAZOLE SODIUM 40 MG PO TBEC
40.0000 mg | DELAYED_RELEASE_TABLET | Freq: Every day | ORAL | Status: DC
  Administered 2015-02-18 – 2015-02-20 (×3): 40 mg via ORAL
  Filled 2015-02-17 (×3): qty 1

## 2015-02-17 MED ORDER — DIPHENHYDRAMINE HCL 25 MG PO TABS: 25.0000 mg | ORAL_TABLET | Freq: Four times a day (QID) | ORAL | Status: DC

## 2015-02-17 MED ORDER — DEXAMETHASONE SODIUM PHOSPHATE 10 MG/ML IJ SOLN
INTRAMUSCULAR | Status: DC | PRN
  Administered 2015-02-17: 10 mg via INTRAVENOUS

## 2015-02-17 MED ORDER — LEVETIRACETAM 500 MG PO TABS
500.0000 mg | ORAL_TABLET | Freq: Two times a day (BID) | ORAL | Status: DC
  Administered 2015-02-17 – 2015-02-20 (×6): 500 mg via ORAL
  Filled 2015-02-17 (×7): qty 1

## 2015-02-17 MED ORDER — ONDANSETRON HCL 4 MG/2ML IJ SOLN
INTRAMUSCULAR | Status: DC | PRN
  Administered 2015-02-17: 4 mg via INTRAVENOUS

## 2015-02-17 MED ORDER — PROPOFOL 10 MG/ML IV BOLUS
INTRAVENOUS | Status: DC | PRN
  Administered 2015-02-17 (×2): 30 mg via INTRAVENOUS
  Administered 2015-02-17: 70 mg via INTRAVENOUS
  Administered 2015-02-17: 150 mg via INTRAVENOUS

## 2015-02-17 MED ORDER — LABETALOL HCL 5 MG/ML IV SOLN
INTRAVENOUS | Status: DC | PRN
  Administered 2015-02-17: 5 mg via INTRAVENOUS

## 2015-02-17 MED ORDER — ROPINIROLE HCL 1 MG PO TABS
3.0000 mg | ORAL_TABLET | ORAL | Status: DC
  Administered 2015-02-17 – 2015-02-19 (×3): 3 mg via ORAL
  Administered 2015-02-19: 0.5 mg via ORAL
  Filled 2015-02-17 (×4): qty 3

## 2015-02-17 SURGICAL SUPPLY — 106 items
APL SKNCLS STERI-STRIP NONHPOA (GAUZE/BANDAGES/DRESSINGS)
BATTERY IQ STERILE (MISCELLANEOUS) ×2 IMPLANT
BENZOIN TINCTURE PRP APPL 2/3 (GAUZE/BANDAGES/DRESSINGS) IMPLANT
BIOMET DRILL BATTERY ×2 IMPLANT
BLADE CLIPPER SURG (BLADE) ×4 IMPLANT
BLADE SAW GIGLI 16 STRL (MISCELLANEOUS) IMPLANT
BLADE SURG 15 STRL LF DISP TIS (BLADE) IMPLANT
BLADE SURG 15 STRL SS (BLADE)
BLADE ULTRA TIP 2M (BLADE) ×4 IMPLANT
BNDG GAUZE ELAST 4 BULKY (GAUZE/BANDAGES/DRESSINGS) IMPLANT
BRUSH SCRUB EZ 1% IODOPHOR (MISCELLANEOUS) ×4 IMPLANT
BTRY SRG DRVR 1.5 IQ (MISCELLANEOUS) ×2
BUR ACORN 6.0 PRECISION (BURR) ×3 IMPLANT
BUR ACORN 6.0MM PRECISION (BURR) ×1
BUR ADDG 1.1 (BURR) IMPLANT
BUR ADDG 1.1MM (BURR)
BUR MATCHSTICK NEURO 3.0 LAGG (BURR) IMPLANT
BUR ROUND FLUTED 4 SOFT TCH (BURR) IMPLANT
BUR ROUND FLUTED 4MM SOFT TCH (BURR)
BUR SPIRAL ROUTER 2.3 (BUR) ×1 IMPLANT
BUR SPIRAL ROUTER 2.3MM (BUR) ×1
CANISTER SUCT 3000ML PPV (MISCELLANEOUS) ×4 IMPLANT
CATH VENTRIC 35X38 W/TROCAR LG (CATHETERS) IMPLANT
CLIP TI MEDIUM 6 (CLIP) IMPLANT
CONT SPEC 4OZ CLIKSEAL STRL BL (MISCELLANEOUS) ×8 IMPLANT
COVER MAYO STAND STRL (DRAPES) IMPLANT
DECANTER SPIKE VIAL GLASS SM (MISCELLANEOUS) ×4 IMPLANT
DRAIN SNY WOU 7FLT (WOUND CARE) IMPLANT
DRAIN SUBARACHNOID (WOUND CARE) IMPLANT
DRAPE MICROSCOPE LEICA (MISCELLANEOUS) IMPLANT
DRAPE NEUROLOGICAL W/INCISE (DRAPES) ×4 IMPLANT
DRAPE PROXIMA HALF (DRAPES) ×4 IMPLANT
DRAPE STERI IOBAN 125X83 (DRAPES) IMPLANT
DRAPE SURG 17X23 STRL (DRAPES) IMPLANT
DRAPE WARM FLUID 44X44 (DRAPE) ×4 IMPLANT
DRSG TELFA 3X8 NADH (GAUZE/BANDAGES/DRESSINGS) ×4 IMPLANT
DURAMATRIX ONLAY 3X3 (Plate) ×2 IMPLANT
DURAPREP 6ML APPLICATOR 50/CS (WOUND CARE) ×4 IMPLANT
ELECT CAUTERY BLADE 6.4 (BLADE) ×4 IMPLANT
ELECT REM PT RETURN 9FT ADLT (ELECTROSURGICAL) ×4
ELECTRODE REM PT RTRN 9FT ADLT (ELECTROSURGICAL) ×2 IMPLANT
EVACUATOR 1/8 PVC DRAIN (DRAIN) IMPLANT
EVACUATOR SILICONE 100CC (DRAIN) IMPLANT
FORCEPS BIPOLAR SPETZLER 8 1.0 (NEUROSURGERY SUPPLIES) ×2 IMPLANT
GAUZE SPONGE 4X4 12PLY STRL (GAUZE/BANDAGES/DRESSINGS) ×4 IMPLANT
GAUZE SPONGE 4X4 16PLY XRAY LF (GAUZE/BANDAGES/DRESSINGS) IMPLANT
GLOVE ECLIPSE 6.5 STRL STRAW (GLOVE) ×4 IMPLANT
GLOVE ECLIPSE 7.5 STRL STRAW (GLOVE) IMPLANT
GLOVE EXAM NITRILE LRG STRL (GLOVE) IMPLANT
GLOVE EXAM NITRILE MD LF STRL (GLOVE) IMPLANT
GLOVE EXAM NITRILE XL STR (GLOVE) IMPLANT
GLOVE EXAM NITRILE XS STR PU (GLOVE) IMPLANT
GOWN STRL REUS W/ TWL LRG LVL3 (GOWN DISPOSABLE) ×4 IMPLANT
GOWN STRL REUS W/ TWL XL LVL3 (GOWN DISPOSABLE) IMPLANT
GOWN STRL REUS W/TWL 2XL LVL3 (GOWN DISPOSABLE) IMPLANT
GOWN STRL REUS W/TWL LRG LVL3 (GOWN DISPOSABLE) ×8
GOWN STRL REUS W/TWL XL LVL3 (GOWN DISPOSABLE)
HEMOSTAT POWDER KIT SURGIFOAM (HEMOSTASIS) ×6 IMPLANT
HEMOSTAT SURGICEL 2X14 (HEMOSTASIS) IMPLANT
KIT BASIN OR (CUSTOM PROCEDURE TRAY) ×4 IMPLANT
KIT DRAIN CSF ACCUDRAIN (MISCELLANEOUS) IMPLANT
KIT ROOM TURNOVER OR (KITS) ×4 IMPLANT
KNIFE ARACHNOID DISP AM-24-S (MISCELLANEOUS) ×4 IMPLANT
MARKER SPHERE PSV REFLC 13MM (MARKER) ×20 IMPLANT
NDL HYPO 25X1 1.5 SAFETY (NEEDLE) ×2 IMPLANT
NDL SPNL 18GX3.5 QUINCKE PK (NEEDLE) IMPLANT
NEEDLE HYPO 25X1 1.5 SAFETY (NEEDLE) ×4 IMPLANT
NEEDLE SPNL 18GX3.5 QUINCKE PK (NEEDLE) IMPLANT
NS IRRIG 1000ML POUR BTL (IV SOLUTION) ×4 IMPLANT
PACK CRANIOTOMY (CUSTOM PROCEDURE TRAY) ×4 IMPLANT
PAD DRESSING TELFA 3X8 NADH (GAUZE/BANDAGES/DRESSINGS) ×2 IMPLANT
PAD EYE OVAL STERILE LF (GAUZE/BANDAGES/DRESSINGS) IMPLANT
PATTIES SURGICAL .25X.25 (GAUZE/BANDAGES/DRESSINGS) IMPLANT
PATTIES SURGICAL .5 X.5 (GAUZE/BANDAGES/DRESSINGS) IMPLANT
PATTIES SURGICAL .5 X3 (DISPOSABLE) ×2 IMPLANT
PATTIES SURGICAL 1/4 X 3 (GAUZE/BANDAGES/DRESSINGS) IMPLANT
PATTIES SURGICAL 1X1 (DISPOSABLE) IMPLANT
PLATE 1.5  2HOLE LNG NEURO (Plate) ×4 IMPLANT
PLATE 1.5 2HOLE LNG NEURO (Plate) IMPLANT
PLATE 1.5/0.5 13MM BURR HOLE (Plate) ×2 IMPLANT
PLATE 1.5/0.5 18.5MM BURR HOLE (Plate) ×2 IMPLANT
PLATE 1.5/0.5 22MM BURR HO (Plate) ×2 IMPLANT
RUBBERBAND STERILE (MISCELLANEOUS) IMPLANT
SCREW SELF DRILL HT 1.5/4MM (Screw) ×38 IMPLANT
SPECIMEN JAR SMALL (MISCELLANEOUS) IMPLANT
SPONGE NEURO XRAY DETECT 1X3 (DISPOSABLE) IMPLANT
SPONGE SURGIFOAM ABS GEL 100 (HEMOSTASIS) ×4 IMPLANT
STAPLER VISISTAT 35W (STAPLE) ×6 IMPLANT
STOCKINETTE 6  STRL (DRAPES) ×2
STOCKINETTE 6 STRL (DRAPES) ×2 IMPLANT
SUT ETHILON 3 0 FSL (SUTURE) IMPLANT
SUT ETHILON 3 0 PS 1 (SUTURE) IMPLANT
SUT NURALON 4 0 TR CR/8 (SUTURE) ×12 IMPLANT
SUT SILK 0 TIES 10X30 (SUTURE) IMPLANT
SUT VIC AB 0 CT1 18XCR BRD8 (SUTURE) ×4 IMPLANT
SUT VIC AB 0 CT1 8-18 (SUTURE) ×12
SUT VIC AB 3-0 SH 8-18 (SUTURE) ×8 IMPLANT
SYR CONTROL 10ML LL (SYRINGE) ×4 IMPLANT
TIP SONASTAR STD MISONIX 1.9 (TRAY / TRAY PROCEDURE) IMPLANT
TOWEL OR 17X24 6PK STRL BLUE (TOWEL DISPOSABLE) ×4 IMPLANT
TOWEL OR 17X26 10 PK STRL BLUE (TOWEL DISPOSABLE) ×4 IMPLANT
TRAY FOLEY W/METER SILVER 14FR (SET/KITS/TRAYS/PACK) ×4 IMPLANT
TUBE CONNECTING 12'X1/4 (SUCTIONS) ×1
TUBE CONNECTING 12X1/4 (SUCTIONS) ×3 IMPLANT
UNDERPAD 30X30 INCONTINENT (UNDERPADS AND DIAPERS) ×4 IMPLANT
WATER STERILE IRR 1000ML POUR (IV SOLUTION) ×4 IMPLANT

## 2015-02-17 NOTE — H&P (Signed)
CC:  Left sided weakness, history of meningioma  HPI: [name] is a 81 y.o. male with a history of atypical meningioma resected about 15mo ago. Recurence was treated with SRS. Over the last few months he has had recurrence of left arm and foot weakness. F/U MRI demonstrated continued growth of the meningioma. He therefore presents for re-resection.  PMH: Past Medical History  Diagnosis Date  . Anemia   . Prostate cancer   . Basal cell cancer   . Hyperlipidemia   . Restless leg   . GERD (gastroesophageal reflux disease)   . Brain tumor   . History of radiation therapy     back approximately 2010 prostate radiation under care of Dr. Valere Dross   . Hypertension   . Herniated disc     lumbar spine  . OSA (obstructive sleep apnea) 10/28/2014    Moderate with AHI 21/hr  . Meningioma     brain  . Hiatal hernia   . Occult blood in stools   . Nocturia     PSH: Past Surgical History  Procedure Laterality Date  . Joint replacement      right knee replacement  . Bilateral cateract surgery    . Vasectomy    . Colonoscopy      colon polyp removed  . Craniotomy Right 09/27/2013    Procedure: RIGHT FRONTAL CRANIOTOMY FOR TUMOR RESECTION ;  Surgeon: Consuella Lose, MD;  Location: [facility];  Service: Neurosurgery;  Laterality: Right;  . Prostate biopsy    . Tonsillectomy      Agae 6-7    SH: History  Substance Use Topics  . Smoking status: Never Smoker   . Smokeless tobacco: Former Systems developer    Types: [place] date: 10/08/1973  . Alcohol Use: No    MEDS: Prior to Admission medications   Medication Sig Start Date End Date Taking? Authorizing Provider  acetaminophen (TYLENOL) 500 MG tablet Take 1,000 mg by mouth every 8 (eight) hours as needed (pain).   Yes Historical Provider, MD  b complex vitamins tablet Take 1 tablet by mouth daily.   Yes Historical Provider, MD  chlorpheniramine (CHLOR-TRIMETON) 4 MG tablet Take 4 mg by mouth daily.   Yes Historical Provider, MD   dexamethasone (DECADRON) 2 MG tablet Take 2 mg by mouth 2 (two) times daily with a meal.   Yes Historical Provider, MD  famotidine (PEPCID) 20 MG tablet Take 20 mg by mouth at bedtime.   Yes Historical Provider, MD  ferrous sulfate 325 (65 FE) MG tablet Take 650 mg by mouth 2 (two) times daily with a meal.    Yes Historical Provider, MD  levETIRAcetam (KEPPRA) 500 MG tablet Take 500 mg by mouth 2 (two) times daily.   Yes Historical Provider, MD  magnesium gluconate (MAGONATE) 500 MG tablet Take 500 mg by mouth daily.   Yes Historical Provider, MD  omeprazole (PRILOSEC) 20 MG capsule Take 1 capsule (20 mg total) by mouth daily. 10/18/13  Yes Daniel J Angiulli, PA-C  Probiotic Product (PROBIOTIC DAILY PO) Take 1 capsule by mouth daily.    Yes Historical Provider, MD  rOPINIRole (REQUIP) 3 MG tablet Take 3 mg by mouth at bedtime. Pt takes 1 hour before bedtime 11/18/14  Yes Historical Provider, MD  simvastatin (ZOCOR) 40 MG tablet Take 1 tablet (40 mg total) by mouth every evening. 10/18/13  Yes Daniel J Angiulli, PA-C  solifenacin (VESICARE) 5 MG tablet Take 1 tablet (5 mg total)  by mouth daily. Patient taking differently: Take 5 mg by mouth every morning.  10/18/13  Yes Daniel J Angiulli, PA-C  tamsulosin (FLOMAX) 0.4 MG CAPS capsule Take 1 capsule (0.4 mg total) by mouth daily after supper. Patient taking differently: Take 0.4 mg by mouth daily after breakfast.  10/18/13  Yes Daniel J Angiulli, PA-C  valsartan-hydrochlorothiazide (DIOVAN-HCT) 160-12.5 MG per tablet Take 1 tablet by mouth daily.   Yes Historical Provider, MD  vitamin B-12 (CYANOCOBALAMIN) 100 MCG tablet Take 100 mcg by mouth daily.    Yes Historical Provider, MD    ALLERGY: Allergies  Allergen Reactions  . Asa [Aspirin] Nausea Only  . Vicodin [Hydrocodone-Acetaminophen] Nausea Only    ROS: ROS  NEUROLOGIC EXAM: Awake, alert, oriented Memory and concentration grossly intact Speech fluent, appropriate CN grossly intact Motor  exam: Upper Extremities Deltoid Bicep Tricep Grip  Right 5/5 5/5 5/5 5/5  Left 4/5 -->       Lower Extremity IP Quad PF DF EHL  Right 5/5 5/5 5/5 5/5 5/5  Left 4/5 -->       Sensation grossly intact to LT   IMPRESSION: - 81 y.o. male with symptomatic recurrence of atypical meningioma 18 months after resection and about 1 yr after SRS.  PLAN: - Proceed with surgical resection - ICU postop  I reviewed the risks of the procedure, its indications, and benefits in the office. All questions were answered.

## 2015-02-17 NOTE — Op Note (Signed)
PREOP DIAGNOSIS:  1. Recurrent right frontoparietal meningioma   POSTOP DIAGNOSIS: Same  PROCEDURE: 1. Stereotactic right frontoparietal craniotomy for resection of meningioma 2. Use of intraoperative microscope for microdissection  SURGEON: Dr. Consuella Lose, MD  ASSISTANT: Dr. Cooper Render. Pool, MD  ANESTHESIA: General Endotracheal  EBL: 200cc  SPECIMENS: right frontoparietal meningioma for permanent pathology  DRAINS: None  COMPLICATIONS: None immediate  CONDITION: Hemodynamically stable to PACU  HISTORY: [name] is a 81 y.o. male with a history of right frontoparietal meningioma, who underwent resection about 18 months ago. Pathology at that time demonstrated a WHO grade 2 atypical meningioma. Routine radiologic surveillance demonstrated recurrence, and he underwent stereotactic radiosurgery. Subsequent to this, he began to develop symptoms of left-sided weakness involving primarily the hand and foot. Repeat MRI did demonstrate fairly significant recurrence of the tumor posterior to the original location. He was not a candidate for further radiation. Surgical resection was therefore indicated. The risks and benefits of the surgery were explained in detail to the patient and his family. After all questions were answered, informed consent was obtained.  PROCEDURE IN DETAIL: After informed consent was obtained and witnessed, the patient was brought to the operating room. After induction of general anesthesia, the [place] headholder was applied. The patient was positioned on the operative table in the left lateral decubitus position. All pressure points were meticulously padded. Utilizing the preoperative stereotactic MRI scan, surface markers were co-registered until satisfactory accuracy was achieved. Previous skin incision was then marked out, and the stereotactic system was used to extend the incision posteriorly to allow access to the recurrent tumor. Incision was then  clipped, prepped and draped in the usual sterile fashion.  After timeout was conducted, The old skin incision was opened sharply down to the bone. Periosteal elevator was used to elevate the skin flap anteriorly. The previous craniotomy was identified, as were the titanium plates and screws. The new posterior limb of the incision was then opened sharply, Raney clips were used for hemostasis. Skin flaps were then elevated in the subperiosteal plane. Self-retaining retractors were then placed.   At this point, the 3 previous bur hole covers were removed. The edge of the previous craniotomy was dissected free. The stereotactic system was then used to plan out a extension of the craniotomy posteriorly. A new posterior bur hole was then created. Utilizing the craniotome with footplate, a new posterior craniotomy flap was elevated. The previous craniotomy flap was transected with the drill. Both bone flap pieces were then elevated. Good hemostasis on the dural surface was achieved with bipolar electrocautery and morcellized Gelfoam and thrombin.  The dura was then incised in a curvilinear fashion, based medially. Tumor was easily identified with a good pial margin, and was purple in color, relatively soft. The tumor was slowly dissected away from the brain starting at the lateral margin, and working medially. Similarly, anteriorly the same dissection was carried out. Once the majority of the tumor on the brain surface was removed, it was sent for permanent pathology. Attention was then turned to the remaining portion of tumor that was adherent to the undersurface of the dura, and along the superior sagittal sinus, and the superior portion of the falx. Bulky portions of the tumor were removed, and the remainder of the tumor that was stuck to the dura was coagulated with bipolar. At this point, having completed the resection, tumor margins were inspected, and no identifiable tumor remained on the brain  surface.  Hemostasis was then secured  using a combination of bipolar electrocautery and morcellized Gelfoam and thrombin. The wound is irrigated with copious months of normal saline irrigation. Dura matrix dural graft was then used to cover the dural opening. The 2 bone pieces were then secured, and plated to the skull with standard titanium plates and screws.  The wound was again irrigated with copious amounts of normal saline irrigation. [name] was closed using interrupted oh and 30 by stitches. The skin was closed using standard surgical skin staples. Bacitracin ointment and sterile dressings were then applied. The patient was then transferred to stretcher, extubated, and taken to the postanesthesia care unit in stable hemodynamic condition. At the end of the case all sponge, needle, instrument, and cottonoid counts were correct.

## 2015-02-17 NOTE — Progress Notes (Signed)
Pt unable to wear ffm for CPAP tonight due to pain from recent frontal parietal craniotomy. Pt remains on 1Lpm nasal cannula SpO2-99% RR-16. RN notified. RT will continue to monitor as needed.

## 2015-02-17 NOTE — Anesthesia Preprocedure Evaluation (Addendum)
Anesthesia Evaluation  Patient identified by MRN, date of birth, ID band Patient awake    Reviewed: Allergy & Precautions, NPO status , Patient's Chart, lab work & pertinent test results  Airway Mallampati: II  TM Distance: >3 FB Neck ROM: Full    Dental no notable dental hx.    Pulmonary sleep apnea and Continuous Positive Airway Pressure Ventilation ,  breath sounds clear to auscultation  Pulmonary exam normal       Cardiovascular hypertension, Pt. on medications Normal cardiovascular examRhythm:Regular Rate:Normal     Neuro/Psych L foot drop. LUE weakness and tremor occasionally  Neuromuscular disease negative psych ROS   GI/Hepatic negative GI ROS, Neg liver ROS,   Endo/Other  negative endocrine ROS  Renal/GU negative Renal ROS  negative genitourinary   Musculoskeletal negative musculoskeletal ROS (+)   Abdominal   Peds negative pediatric ROS (+)  Hematology negative hematology ROS (+)   Anesthesia Other Findings   Reproductive/Obstetrics negative OB ROS                            Anesthesia Physical Anesthesia Plan  ASA: III  Anesthesia Plan: General   Post-op Pain Management:    Induction: Intravenous  Airway Management Planned: Oral ETT  Additional Equipment: Arterial line  Intra-op Plan:   Post-operative Plan: Possible Post-op intubation/ventilation  Informed Consent: I have reviewed the patients History and Physical, chart, labs and discussed the procedure including the risks, benefits and alternatives for the proposed anesthesia with the patient or authorized representative who has indicated his/her understanding and acceptance.   Dental advisory given  Plan Discussed with: CRNA and Surgeon  Anesthesia Plan Comments:         Anesthesia Quick Evaluation

## 2015-02-17 NOTE — Anesthesia Procedure Notes (Signed)
Procedure Name: Intubation Performed by: [name] Pre-anesthesia Checklist: Patient identified, Emergency Drugs available, Suction available, Timeout performed and Patient being monitored Patient Re-evaluated:Patient Re-evaluated prior to inductionOxygen Delivery Method: Circle system utilized Preoxygenation: Pre-oxygenation with 100% oxygen Intubation Type: IV induction Ventilation: Mask ventilation without difficulty Laryngoscope Size: Mac and 4 Grade View: Grade I Tube size: 7.5 mm Number of attempts: 1 Airway Equipment and Method: Stylet Placement Confirmation: ETT inserted through vocal cords under direct vision,  positive ETCO2,  CO2 detector and breath sounds checked- equal and bilateral Secured at: 23 cm Tube secured with: Tape Dental Injury: Teeth and Oropharynx as per pre-operative assessment

## 2015-02-17 NOTE — Transfer of Care (Signed)
Immediate Anesthesia Transfer of Care Note  Patient: [name]  Procedure(s) Performed: Procedure(s) with comments: Right frontal parietal craniotomy for resection of meningioma with brainlab (Right) - Right frontal parietal craniotomy for resection of meningioma with brainlab APPLICATION OF CRANIAL NAVIGATION (N/A) - APPLICATION OF CRANIAL NAVIGATION  Patient Location: PACU  Anesthesia Type:General  Level of Consciousness: awake, patient cooperative and responds to stimulation  Airway & Oxygen Therapy: Patient Spontanous Breathing and Patient connected to nasal cannula oxygen  Post-op Assessment: Report given to RN, Post -op Vital signs reviewed and stable, Patient moving all extremities X 4 and Patient able to stick tongue midline  Post vital signs: Reviewed and stable  Last Vitals:  Filed Vitals:   02/17/15 0818  BP: 166/70  Pulse: 65  Temp: 36.4 C  Resp: 18    Complications: No apparent anesthesia complications

## 2015-02-17 NOTE — Anesthesia Postprocedure Evaluation (Signed)
  Anesthesia Post-op Note  Patient: [name]  Procedure(s) Performed: Procedure(s) (LRB): Right frontal parietal craniotomy for resection of meningioma with brainlab (Right) APPLICATION OF CRANIAL NAVIGATION (N/A)  Patient Location: PACU  Anesthesia Type: General  Level of Consciousness: awake and alert   Airway and Oxygen Therapy: Patient Spontanous Breathing  Post-op Pain: mild  Post-op Assessment: Post-op Vital signs reviewed, Patient's Cardiovascular Status Stable, Respiratory Function Stable, Patent Airway and No signs of Nausea or vomiting  Last Vitals:  Filed Vitals:   02/17/15 1500  BP: 132/78  Pulse: 67  Temp:   Resp: 9    Post-op Vital Signs: stable   Complications: No apparent anesthesia complications

## 2015-02-18 ENCOUNTER — Encounter (HOSPITAL_COMMUNITY): Payer: Self-pay | Admitting: Neurosurgery

## 2015-02-18 ENCOUNTER — Inpatient Hospital Stay (HOSPITAL_COMMUNITY): Payer: Medicare Other

## 2015-02-18 MED ORDER — OXYCODONE HCL 5 MG PO TABS
5.0000 mg | ORAL_TABLET | ORAL | Status: DC | PRN
Start: 2015-02-18 — End: 2015-02-20
  Administered 2015-02-18: 5 mg via ORAL
  Filled 2015-02-18: qty 1

## 2015-02-18 MED ORDER — GADOBENATE DIMEGLUMINE 529 MG/ML IV SOLN
19.0000 mL | Freq: Once | INTRAVENOUS | Status: AC | PRN
  Administered 2015-02-18: 19 mL via INTRAVENOUS

## 2015-02-18 NOTE — Progress Notes (Signed)
Pt still unable to wear full face mask from pain of craniotomy. RT will continue to monitor.

## 2015-02-18 NOTE — Evaluation (Signed)
Physical Therapy Evaluation Patient Details Name: [name] MRN: [id number] DOB: November 11, 1930 Today's Date: 02/18/2015   History of Present Illness  Patient is an 81 y/o male admitted with meningioma recurrance R frontoparietal area after frontal craniotomy 09/26/14.  Now s/p R frontoparietal craniotomy.  PMH positive for GERD, hyperlipidemia and HTN.  Clinical Impression  Patient presents with decrease mobility due to deficits listed in PT problem list.  He will benefit from skilled PT in the acute setting to allow return home with wife assist and follow up outpatient PT.     Follow Up Recommendations Outpatient PT    Equipment Recommendations  None recommended by PT    Recommendations for Other Services       Precautions / Restrictions Precautions Precautions: Fall Precaution Comments: low BP      Mobility  Bed Mobility               General bed mobility comments: pt up in chair  Transfers Overall transfer level: Needs assistance Equipment used: Rolling walker (2 wheeled) Transfers: Sit to/from Stand Sit to Stand: Min guard         General transfer comment: for safety  Ambulation/Gait Ambulation/Gait assistance: Min guard Ambulation Distance (Feet): 150 Feet Assistive device: Rolling walker (2 wheeled) Gait Pattern/deviations: Step-through pattern;Decreased stride length        Stairs            Wheelchair Mobility    Modified Rankin (Stroke Patients Only)       Balance Overall balance assessment: Needs assistance   Sitting balance-Leahy Scale: Good       Standing balance-Leahy Scale: Fair Standing balance comment: can stand static without UE support, but needs walker for ambulation                             Pertinent Vitals/Pain Pain Assessment: No/denies pain    Home Living Family/patient expects to be discharged to:: Private residence Living Arrangements: Spouse/significant other Available Help at Discharge:  Family;Available 24 hours/day Type of Home: House Home Access: Stairs to enter Entrance Stairs-Rails: Right Entrance Stairs-Number of Steps: 4 Home Layout: One level Home Equipment: Cane - single point;Grab bars - tub/shower;Walker - 4 wheels Additional Comments: uses RW in morning and cane later in the day (more outside)    Prior Function Level of Independence: Independent with assistive device(s)               Hand Dominance   Dominant Hand: Right    Extremity/Trunk Assessment               Lower Extremity Assessment: RLE deficits/detail;LLE deficits/detail RLE Deficits / Details: AROM WFL, strength hip flexion 4/5, knee extension 4/5, ankle DF 4+/5 LLE Deficits / Details: AROM WFL, strength hip flexion 4-/5, knee extension 4/5, ankle DF 4/5     Communication   Communication: No difficulties  Cognition Arousal/Alertness: Awake/alert Behavior During Therapy: WFL for tasks assessed/performed Overall Cognitive Status: Within Functional Limits for tasks assessed                      General Comments      Exercises        Assessment/Plan    PT Assessment Patient needs continued PT services  PT Diagnosis Abnormality of gait;Hemiplegia non-dominant side   PT Problem List Decreased strength;Decreased mobility;Decreased balance;Decreased knowledge of use of DME;Decreased activity tolerance;Decreased safety awareness  PT Treatment Interventions DME  instruction;Therapeutic exercise;Gait training;Balance training;Stair training;Functional mobility training;Therapeutic activities;Patient/family education   PT Goals (Current goals can be found in the Care Plan section) Acute Rehab PT Goals Patient Stated Goal: to go to outpatient rehab PT Goal Formulation: With patient Time For Goal Achievement: 03/04/15 Potential to Achieve Goals: Good    Frequency Min 3X/week   Barriers to discharge        Co-evaluation               End of Session  Equipment Utilized During Treatment: Gait belt Activity Tolerance: Patient tolerated treatment well Patient left: in chair;with call bell/phone within reach           Time: 1423-1449 PT Time Calculation (min) (ACUTE ONLY): 26 min   Charges:   PT Evaluation $Initial PT Evaluation Tier I: 1 Procedure PT Treatments $Gait Training: 8-22 mins   PT G CodesMagda Kiel 17-Mar-2015, 3:21 PM [place], [place] 2015-03-17

## 2015-02-18 NOTE — Care Management Note (Signed)
Case Management Note  Patient Details  Name: [name] MRN: [id number] Date of Birth: Jul 22, 1931  Subjective/Objective:    Pt s/p Rt frontal craniotomy for resection of meningioma.  PTA, pt resided at home with spouse.              Action/Plan: PT evaluation pending.  Will follow for discharge planning needs.    Expected Discharge Date:                  Expected Discharge Plan:  [place]  In-House Referral:     Discharge planning Services  CM Consult  Post Acute Care Choice:    Choice offered to:     DME Arranged:    DME Agency:     HH Arranged:    [place] Agency:     Status of Service:  In process, will continue to follow  Medicare Important Message Given:    Date Medicare IM Given:    Medicare IM give by:    Date Additional Medicare IM Given:    Additional Medicare Important Message give by:     If discussed at [place] of Stay Meetings, dates discussed:    Additional Comments:  Reinaldo Raddle, RN, BSN  Trauma/Neuro ICU Case Manager [phone]

## 2015-02-18 NOTE — Progress Notes (Signed)
Pt seen and examined. No issues overnight. Pt reports minimal HA. Subjective improvement in both LUE/LLE strength. No other c/o.  EXAM: Temp:  [97.5 F (36.4 C)-98 F (36.7 C)] 98 F (36.7 C) (06/01 0757) Pulse Rate:  [51-70] 66 (06/01 0730) Resp:  [9-23] 16 (06/01 0730) BP: (92-142)/(44-121) 113/54 mmHg (06/01 0730) SpO2:  [97 %-100 %] 97 % (06/01 0730) Arterial Line BP: (89-162)/(44-84) 130/57 mmHg (06/01 0730) Intake/Output      05/31 0701 - 06/01 0700 06/01 0701 - 06/02 0700   I.V. (mL/kg) 2010 (22.7)    IV Piggyback 100    Total Intake(mL/kg) 2110 (23.9)    Urine (mL/kg/hr) 3125 800 (5.7)   Blood 250    Total Output 3375 800   Net -1265 -800         Awake, alert, oriented Speech fluent CN intact Good strength throughout  LABS: Lab Results  Component Value Date   CREATININE 1.02 02/17/2015   BUN 14 02/10/2015   NA 135 02/10/2015   K 3.6 02/10/2015   CL 103 02/10/2015   CO2 23 02/10/2015   Lab Results  Component Value Date   WBC 11.4* 02/17/2015   HGB 13.0 02/17/2015   HCT 40.1 02/17/2015   MCV 94.6 02/17/2015   PLT 206 02/17/2015    IMPRESSION: - 81 y.o. male POD#1 s/p resection of recurrent atypical meningioma, doing well  PLAN: - D/C foley - D/C A-line - mobilize with PT/OT today - Likely transfer to floor tomorrow

## 2015-02-19 LAB — MRSA PCR SCREENING: MRSA BY PCR: NEGATIVE

## 2015-02-19 NOTE — Progress Notes (Signed)
Pt arrived to 4N29 via bed.  No c/o pain at this time.  Pt welcomed and settled into the room.  Wife present at bedside.  Call bell within reach.  Will continue to monitor. Cori Razor, RN

## 2015-02-19 NOTE — Progress Notes (Signed)
Pt seen and examined. No issues overnight. Minimal HA, able to walk with PT yesterday, was not dragging his left foot.  EXAM: Temp:  [97.9 F (36.6 C)-98.3 F (36.8 C)] 98.1 F (36.7 C) (06/02 0800) Pulse Rate:  [56-84] 65 (06/02 0800) Resp:  [7-22] 15 (06/02 0800) BP: (92-131)/(38-69) 130/69 mmHg (06/02 0800) SpO2:  [93 %-99 %] 99 % (06/02 0800) Arterial Line BP: (102-131)/(40-59) 131/56 mmHg (06/01 1810) Intake/Output      06/01 0701 - 06/02 0700 06/02 0701 - 06/03 0700   P.O. 400    I.V. (mL/kg)     IV Piggyback     Total Intake(mL/kg) 400 (4.5)    Urine (mL/kg/hr) 3000 (1.4)    Blood     Total Output 3000     Net -2600          Urine Occurrence 1 x     Awake, alert, oriented Speech fluent CN intact Good strength Wound c/d/i  LABS: Lab Results  Component Value Date   CREATININE 1.02 02/17/2015   BUN 14 02/10/2015   NA 135 02/10/2015   K 3.6 02/10/2015   CL 103 02/10/2015   CO2 23 02/10/2015   Lab Results  Component Value Date   WBC 11.4* 02/17/2015   HGB 13.0 02/17/2015   HCT 40.1 02/17/2015   MCV 94.6 02/17/2015   PLT 206 02/17/2015    IMAGING: MRI reviewed. Small area of enhancement anteriorly may represent minimal residual v postoperative enhancement. There is continued enhancement around the sinus. Stable edema.  IMPRESSION: - 81 y.o. male POD#2 s/p resection of recurrent atypical meningioma, doing well  PLAN: - Transfer to floor - Cont mobilization with PT/OT - Home tomorrow with outpatient PT

## 2015-02-19 NOTE — Progress Notes (Signed)
Physical Therapy Treatment Patient Details Name: [name] MRN: [id number] DOB: 01-30-1931 Today's Date: 02/19/2015    History of Present Illness Patient is an 81 y/o male admitted with meningioma recurrance R frontoparietal area after frontal craniotomy 09/26/14.  Now s/p R frontoparietal craniotomy.  PMH positive for GERD, hyperlipidemia and HTN.    PT Comments    Progressing well.  Makes safe choices for assistive device use and uses RW and cane well.  Could benefit from higher level balance challenges at outpatient.  Follow Up Recommendations  Outpatient PT (for higher level balance challenges)     Equipment Recommendations  None recommended by PT    Recommendations for Other Services       Precautions / Restrictions Precautions Precautions: Fall    Mobility  Bed Mobility Overal bed mobility: Needs Assistance Bed Mobility: Supine to Sit;Sit to Supine     Supine to sit: Supervision Sit to supine: Supervision   General bed mobility comments: smooth transitions  Transfers Overall transfer level: Needs assistance Equipment used: Rolling walker (2 wheeled) Transfers: Sit to/from Stand Sit to Stand: Supervision         General transfer comment: for safety  Ambulation/Gait Ambulation/Gait assistance: Supervision Ambulation Distance (Feet): 300 Feet Assistive device: Rolling walker (2 wheeled);Straight cane Gait Pattern/deviations: Step-through pattern     General Gait Details: manage gait safely with RW or straight cane.  Pt is mildly unsteady, but steadies himself with cane well.   Stairs Stairs: Yes Stairs assistance: Min guard Stair Management: One rail Right;Step to pattern;Forwards Number of Stairs: 4 General stair comments: safe with rail  Wheelchair Mobility    Modified Rankin (Stroke Patients Only)       Balance Overall balance assessment: Needs assistance Sitting-balance support: No upper extremity supported Sitting balance-Leahy Scale:  Good     Standing balance support: No upper extremity supported Standing balance-Leahy Scale: Fair                      Cognition Arousal/Alertness: Awake/alert Behavior During Therapy: WFL for tasks assessed/performed Overall Cognitive Status: Within Functional Limits for tasks assessed                      Exercises      General Comments        Pertinent Vitals/Pain Pain Assessment: No/denies pain    Home Living Family/patient expects to be discharged to:: Private residence Living Arrangements: Spouse/significant other Available Help at Discharge: Family;Available 24 hours/day Type of Home: House Home Access: Stairs to enter Entrance Stairs-Rails: Right Home Layout: One level Home Equipment: Cane - single point;Grab bars - tub/shower;Walker - 4 wheels Additional Comments: uses RW in morning and cane later in the day (more outside)    Prior Function Level of Independence: Independent with assistive device(s)          PT Goals (current goals can now be found in the care plan section) Acute Rehab PT Goals Patient Stated Goal: to go to outpatient rehab PT Goal Formulation: With patient Time For Goal Achievement: 03/04/15 Potential to Achieve Goals: Good Progress towards PT goals: Progressing toward goals    Frequency  Min 3X/week    PT Plan Current plan remains appropriate    Co-evaluation             End of Session Equipment Utilized During Treatment: Gait belt Activity Tolerance: Patient tolerated treatment well Patient left: in chair;with call bell/phone within reach  Time: 4081-4481 PT Time Calculation (min) (ACUTE ONLY): 20 min  Charges:  $Gait Training: 8-22 mins                    G Codes:      Keiarra Charon, Tessie Fass 02/19/2015, 4:19 PM 02/19/2015  Donnella Sham, PT [phone]  (pager)

## 2015-02-19 NOTE — Evaluation (Signed)
Occupational Therapy Evaluation Patient Details Name: [name] MRN: [id number] DOB: 26-May-1931 Today's Date: 02/19/2015    History of Present Illness Patient is an 81 y/o male admitted with meningioma recurrance R frontoparietal area after frontal craniotomy 09/26/14.  Now s/p R frontoparietal craniotomy.  PMH positive for GERD, hyperlipidemia and HTN.   Clinical Impression   Pt admitted with recurrance meningioioma. Pt currently with functional limitations due to the deficits listed below (see OT Problem List).  Pt will benefit from skilled OT to increase their safety and independence with ADL and functional mobility for ADL to facilitate discharge to venue listed below.      Follow Up Recommendations  Outpatient OT    Equipment Recommendations  None recommended by OT    Recommendations for Other Services       Precautions / Restrictions Precautions Precautions: Fall Restrictions Weight Bearing Restrictions: No      Mobility Bed Mobility Overal bed mobility: Needs Assistance Bed Mobility: Supine to Sit     Supine to sit: Min guard        Transfers Overall transfer level: Needs assistance Equipment used: Rolling walker (2 wheeled) Transfers: Sit to/from [facility] Sit to Stand: Min guard         General transfer comment: for safety    Balance Overall balance assessment: Needs assistance Sitting-balance support: No upper extremity supported;Feet supported Sitting balance-Leahy Scale: Good                                      ADL Overall ADL's : Needs assistance/impaired     Grooming: Min guard;Standing   Upper Body Bathing: Minimal assitance;Sitting   Lower Body Bathing: Minimal assistance;Sit to/from stand   Upper Body Dressing : Minimal assistance;Sitting   Lower Body Dressing: Minimal assistance;Sit to/from stand   Toilet Transfer: Min guard;Ambulation;Comfort height toilet;RW;Cueing for safety   Toileting-  Clothing Manipulation and Hygiene: Min guard;Sit to/from stand       Functional mobility during ADLs: Min guard                 Pertinent Vitals/Pain Pain Assessment: No/denies pain     Hand Dominance Right   Extremity/Trunk Assessment Upper Extremity Assessment Upper Extremity Assessment: LUE deficits/detail;Generalized weakness LUE Coordination: decreased fine motor (pt feels this is improved since surgery- but still  decreased strength and FM)           Communication Communication Communication: No difficulties   Cognition               WFL              General Comments   Wife will a as needed    Exercises  Instructed in L UE exercise- specifically for L hand. Pt familiar with this from prior surgery.  Discussed picking up pennies, nuts and bolts and using LUE as much as possible.  Will provide HEP for pt.  Pt wants to follow up at [facility] expects to be discharged to:: Private residence Living Arrangements: Spouse/significant other Available Help at Discharge: Family;Available 24 hours/day Type of Home: House Home Access: Stairs to enter [employer] of Steps: 4 Entrance Stairs-Rails: Right Home Layout: One level     Bathroom Shower/Tub: Occupational psychologist: Handicapped height     Home Equipment: Reader - single point;Grab  bars - tub/shower;Walker - 4 wheels   Additional Comments: uses RW in morning and cane later in the day (more outside)      Prior Functioning/Environment Level of Independence: Independent with assistive device(s)             OT Diagnosis: Generalized weakness   OT Problem List: Decreased strength;Impaired UE functional use;Impaired balance (sitting and/or standing)   OT Treatment/Interventions: Self-care/ADL training;Patient/family education;Therapeutic exercise    OT Goals(Current goals can be found in the care plan section) Acute Rehab OT Goals Patient  Stated Goal: to go to outpatient rehab OT Goal Formulation: With patient Time For Goal Achievement: 03/05/15 Potential to Achieve Goals: Good ADL Goals Pt Will Perform Grooming: with modified independence;standing Pt Will Perform Upper Body Dressing: with modified independence;sitting Pt Will Perform Lower Body Dressing: with modified independence;sit to/from stand Pt Will Transfer to Toilet: with modified independence;regular height toilet Pt Will Perform Toileting - Clothing Manipulation and hygiene: with modified independence;sit to/from stand Pt/caregiver will Perform Home Exercise Program: Increased strength;Left upper extremity;With theraputty;With written HEP provided;Independently  OT Frequency: Min 2X/week              End of Session Equipment Utilized During Treatment: Surveyor, mining Communication: Mobility status  Activity Tolerance: Patient tolerated treatment well Patient left: in chair;with call bell/phone within reach;with family/visitor present   Time: 1219-1250 OT Time Calculation (min): 31 min Charges:  OT General Charges $OT Visit: 1 Procedure OT Evaluation $Initial OT Evaluation Tier I: 1 Procedure OT Treatments $Self Care/Home Management : 8-22 mins G-Codes:    Payton Mccallum D 02-28-15, 1:02 PM

## 2015-02-19 NOTE — Progress Notes (Signed)
PT refuses CPAP. Due to recent R frontoparietal craniotomy he states that the CPAP puts pressure on his head and makes it hurt. He states he has spoken to the MD and they are of with him not wearing it. RT will monitor.

## 2015-02-20 ENCOUNTER — Other Ambulatory Visit: Payer: Medicare Other

## 2015-02-20 MED ORDER — DEXAMETHASONE 0.5 MG PO TABS: 0.5000 mg | ORAL_TABLET | Freq: Two times a day (BID) | ORAL | Status: DC

## 2015-02-20 NOTE — Discharge Summary (Signed)
  Physician Discharge Summary  Patient ID: [name] MRN: [id number] DOB/AGE: Jan 09, 1931 81 y.o.  Admit date: 02/17/2015 Discharge date: 02/20/2015  Admission Diagnoses: Atypical meningioma  Discharge Diagnoses: Same Active Problems:   Atypical meningioma of brain   Discharged Condition: Stable  Hospital Course:  Mrs. AUGIE VANE is a 81 y.o. male electively admitted after craniotomy for resection of recurrent tumor. He did well postop, reporting improvement in his left side strength. He was evaluated by PT and was stable to go home with outpatient therapy.  Treatments: Surgery - Craniotomy for resection of tumor.  Discharge Exam: Blood pressure 118/59, pulse 79, temperature 97.6 F (36.4 C), temperature source Oral, resp. rate 16, weight 88.451 kg (195 lb), SpO2 100 %. Awake, alert, oriented Speech fluent, appropriate CN grossly intact 5/5 BUE/BLE Wound c/d/i  Follow-up: Follow-up in my office [facility] [phone]) in 2 weeks  Disposition: 01-Home or Self Care  Discharge Instructions    Ambulatory referral to Physical Therapy    Complete by:  As directed             Medication List    TAKE these medications        acetaminophen 500 MG tablet  Commonly known as:  TYLENOL  Take 1,000 mg by mouth every 8 (eight) hours as needed (pain).     b complex vitamins tablet  Take 1 tablet by mouth daily.     chlorpheniramine 4 MG tablet  Commonly known as:  CHLOR-TRIMETON  Take 4 mg by mouth daily.     dexamethasone 0.5 MG tablet  Commonly known as:  DECADRON  Take 1 tablet (0.5 mg total) by mouth 2 (two) times daily.     famotidine 20 MG tablet  Commonly known as:  PEPCID  Take 20 mg by mouth at bedtime.     ferrous sulfate 325 (65 FE) MG tablet  Take 650 mg by mouth 2 (two) times daily with a meal.     levETIRAcetam 500 MG tablet  Commonly known as:  KEPPRA  Take 500 mg by mouth 2 (two) times daily.     magnesium gluconate 500 MG  tablet  Commonly known as:  MAGONATE  Take 500 mg by mouth daily.     omeprazole 20 MG capsule  Commonly known as:  PRILOSEC  Take 1 capsule (20 mg total) by mouth daily.     PROBIOTIC DAILY PO  Take 1 capsule by mouth daily.     rOPINIRole 3 MG tablet  Commonly known as:  REQUIP  Take 3 mg by mouth at bedtime. Pt takes 1 hour before bedtime     simvastatin 40 MG tablet  Commonly known as:  ZOCOR  Take 1 tablet (40 mg total) by mouth every evening.     solifenacin 5 MG tablet  Commonly known as:  VESICARE  Take 1 tablet (5 mg total) by mouth daily.     tamsulosin 0.4 MG Caps capsule  Commonly known as:  FLOMAX  Take 1 capsule (0.4 mg total) by mouth daily after supper.     valsartan-hydrochlorothiazide 160-12.5 MG per tablet  Commonly known as:  DIOVAN-HCT  Take 1 tablet by mouth daily.     vitamin B-12 100 MCG tablet  Commonly known as:  CYANOCOBALAMIN  Take 100 mcg by mouth daily.         SignedConsuella Lose, C 02/20/2015, 9:59 AM

## 2015-02-20 NOTE — Progress Notes (Signed)
Occupational Therapy Treatment Patient Details Name: [name] MRN: [id number] DOB: [birth year] Today's Date: 02/20/2015    History of present illness Patient is an 81 y/o male admitted with meningioma recurrance R frontoparietal area after frontal craniotomy 09/26/14.  Now s/p R frontoparietal craniotomy.  PMH positive for GERD, hyperlipidemia and HTN.   OT comments  Pt sitting EOB upon arrival, states they are going home today.  Pt demonstrated ability to perform UB and LB dressing with Mod I.  Education provided on HEP for L hand fine motor activities, to be performed 3-4 times per day. Pt reports doing these exercises previously, and reports L hand and fingers are improved from PLOF. Pt demonstrated ability to perform shower transfer with supervision, and education provided on using cane/walker to maintain balance at home during functional mobility. Pt would benefit from outpatient OT upon d/c to address dynamic balance and fine motor during ADLs.   Follow Up Recommendations  Outpatient OT    Equipment Recommendations  None recommended by OT    Recommendations for Other Services      Precautions / Restrictions Precautions Precautions: Fall Restrictions Weight Bearing Restrictions: No       Mobility Bed Mobility               General bed mobility comments: Pt sitting EOB upon arrival  Transfers Overall transfer level: Needs assistance Equipment used: Rolling walker (2 wheeled);Straight cane Transfers: Sit to/from Stand Sit to Stand: Supervision         General transfer comment: for safety    Balance Overall balance assessment: Needs assistance Sitting-balance support: Feet supported       Standing balance support: No upper extremity supported   Standing balance comment: needs walker or cane for ambulation. Education provided on using cane at home to prevent falls.                   ADL Overall ADL's : Needs assistance/impaired                  Upper Body Dressing : Modified independent;Sitting   Lower Body Dressing: Modified independent;Sit to/from stand           Tub/ Shower Transfer: Walk-in shower;Min guard;Ambulation (cane) Tub/Shower Transfer Details (indicate cue type and reason): Pt reports having grab bars in shower at home Functional mobility during ADLs: Min guard General ADL Comments: Pt demonstrated ability to dress with Mod Independence, and worked on fine motor tasks related to dressing and grooming. Pt states L hand sensation and ROM is improved from last week.       Vision                     Perception     Praxis      Cognition   Behavior During Therapy: WFL for tasks assessed/performed Overall Cognitive Status: Within Functional Limits for tasks assessed                       Extremity/Trunk Assessment               Exercises     Shoulder Instructions       General Comments      Pertinent Vitals/ Pain       Pain Assessment: No/denies pain  Home Living  Prior Functioning/Environment              Frequency Min 2X/week     Progress Toward Goals  OT Goals(current goals can now be found in the care plan section)  Progress towards OT goals: Progressing toward goals  Acute Rehab OT Goals Patient Stated Goal: to go to outpatient rehab OT Goal Formulation: With patient Time For Goal Achievement: 03/05/15 Potential to Achieve Goals: Good ADL Goals Pt Will Perform Grooming: with modified independence;standing Pt Will Perform Upper Body Dressing: with modified independence;sitting Pt Will Perform Lower Body Dressing: with modified independence;sit to/from stand Pt Will Transfer to Toilet: with modified independence;regular height toilet Pt Will Perform Toileting - Clothing Manipulation and hygiene: with modified independence;sit to/from stand Pt/caregiver will Perform Home Exercise Program: Increased  strength;Left upper extremity;With theraputty;With written HEP provided;Independently  Plan Discharge plan remains appropriate    Co-evaluation                 End of Session Equipment Utilized During Treatment:  Kasandra Knudsen)   Activity Tolerance Patient tolerated treatment well   Patient Left in chair;with call bell/phone within reach;with family/visitor present   Nurse Communication Mobility status        Time: 0315-9458 OT Time Calculation (min): 25 min  Charges: OT General Charges $OT Visit: 1 Procedure OT Treatments $Self Care/Home Management : 8-22 mins $Therapeutic Exercise: 8-22 mins  Forest Gleason 02/20/2015, 11:56 AM

## 2015-02-20 NOTE — Progress Notes (Signed)
Discharge orders received.  Discharge instructions and follow-up appointments reviewed with the patient.  VSS upon discharge.  IV removed and education complete.  Transported out via wheelchair wife present. Cori Razor, RN

## 2015-02-23 ENCOUNTER — Ambulatory Visit: Payer: Self-pay | Admitting: Radiation Oncology

## 2015-02-23 ENCOUNTER — Ambulatory Visit: Payer: Medicare Other

## 2015-02-24 ENCOUNTER — Encounter (HOSPITAL_COMMUNITY): Payer: Self-pay | Admitting: Neurosurgery

## 2015-03-06 ENCOUNTER — Other Ambulatory Visit (HOSPITAL_BASED_OUTPATIENT_CLINIC_OR_DEPARTMENT_OTHER): Payer: Medicare Other

## 2015-03-06 ENCOUNTER — Ambulatory Visit (HOSPITAL_BASED_OUTPATIENT_CLINIC_OR_DEPARTMENT_OTHER): Payer: Medicare Other | Admitting: Oncology

## 2015-03-06 ENCOUNTER — Telehealth: Payer: Self-pay | Admitting: Oncology

## 2015-03-06 VITALS — BP 137/69 | HR 84 | Temp 98.0°F | Resp 18 | Ht 66.0 in | Wt 198.0 lb

## 2015-03-06 DIAGNOSIS — D500 Iron deficiency anemia secondary to blood loss (chronic): Secondary | ICD-10-CM

## 2015-03-06 DIAGNOSIS — D509 Iron deficiency anemia, unspecified: Secondary | ICD-10-CM | POA: Diagnosis not present

## 2015-03-06 DIAGNOSIS — C61 Malignant neoplasm of prostate: Secondary | ICD-10-CM

## 2015-03-06 LAB — CBC WITH DIFFERENTIAL/PLATELET
BASO%: 0.4 % (ref 0.0–2.0)
Basophils Absolute: 0 10*3/uL (ref 0.0–0.1)
EOS%: 1.7 % (ref 0.0–7.0)
Eosinophils Absolute: 0.1 10*3/uL (ref 0.0–0.5)
HEMATOCRIT: 39.9 % (ref 38.4–49.9)
HGB: 12.9 g/dL — ABNORMAL LOW (ref 13.0–17.1)
LYMPH%: 14.6 % (ref 14.0–49.0)
MCH: 31.2 pg (ref 27.2–33.4)
MCHC: 32.3 g/dL (ref 32.0–36.0)
MCV: 96.6 fL (ref 79.3–98.0)
MONO#: 0.6 10*3/uL (ref 0.1–0.9)
MONO%: 7.6 % (ref 0.0–14.0)
NEUT#: 5.5 10*3/uL (ref 1.5–6.5)
NEUT%: 75.7 % — AB (ref 39.0–75.0)
PLATELETS: 260 10*3/uL (ref 140–400)
RBC: 4.13 10*6/uL — AB (ref 4.20–5.82)
RDW: 15 % — ABNORMAL HIGH (ref 11.0–14.6)
WBC: 7.3 10*3/uL (ref 4.0–10.3)
lymph#: 1.1 10*3/uL (ref 0.9–3.3)

## 2015-03-06 LAB — IRON AND TIBC CHCC
%SAT: 24 % (ref 20–55)
IRON: 88 ug/dL (ref 42–163)
TIBC: 369 ug/dL (ref 202–409)
UIBC: 281 ug/dL (ref 117–376)

## 2015-03-06 LAB — FERRITIN CHCC: Ferritin: 47 ng/ml (ref 22–316)

## 2015-03-06 NOTE — Telephone Encounter (Signed)
Pt confirmed MD visit per 06/17 POF, gave pt AVS and Calendar... KJ °

## 2015-03-06 NOTE — Progress Notes (Signed)
Hematology and Oncology Follow Up Visit  [name] [id number] 1931-06-02 81 y.o. 03/06/2015 1:28 PM  Melinda Crutch, MDRoss, [name], MD   Principle Diagnosis: 81 year old gentleman with iron deficiency anemia diagnosed in February 2016. Presented with hemoglobin of 8.8, RDW of 16.9 and ferritin of 14.8. His GI workup so far has been unrevealing.   Prior Therapy: Status post IV iron for a total of 1 g given in March 2016.  Current therapy: Oral iron supplements. He is taking 325 mg tablet twice a day.  Interim History: [name] presents today for a follow-up visit. Since the last visit, he underwent IV iron infusion in March 2016 and tolerated it well. He noticed significant improvement in his symptoms including improvement in his fatigue, energy and improvement in his performance status. He underwent surgical resection of a meningioma on June 1 without any complications. His hemoglobin around that time was back to normal. He is currently feeling well without any complaints. He does not report any hematochezia or melanoma. He does not report any abdominal pain or dyspepsia. His GI workup is still ongoing by gastroenterology. He still have some lower extremity weakness which has been chronic in nature. He does not report any headaches, blurry vision, double vision or syncope or seizures. He does not report any chest pain, palpitation or leg edema or orthopnea. He does report wheezing and exertional dyspnea but no cough or hemoptysis. He does not report any nausea, vomiting, abdominal pain, early satiety. He does not report any frequency, urgency or hesitancy. He does not report any skeletal complaints. Rest of his review of systems unremarkable.   Medications: I have reviewed the patient's current medications.  Current Outpatient Prescriptions  Medication Sig Dispense Refill  . acetaminophen (TYLENOL) 500 MG tablet Take 1,000 mg by mouth every 8 (eight) hours as needed (pain).    Marland Kitchen b complex vitamins  tablet Take 1 tablet by mouth daily.    . chlorpheniramine (CHLOR-TRIMETON) 4 MG tablet Take 4 mg by mouth daily.    . famotidine (PEPCID) 20 MG tablet Take 20 mg by mouth at bedtime.    . ferrous sulfate 325 (65 FE) MG tablet Take 650 mg by mouth 2 (two) times daily with a meal.     . magnesium gluconate (MAGONATE) 500 MG tablet Take 500 mg by mouth daily.    Marland Kitchen omeprazole (PRILOSEC) 20 MG capsule Take 1 capsule (20 mg total) by mouth daily. 30 capsule 1  . Probiotic Product (PROBIOTIC DAILY PO) Take 1 capsule by mouth daily.     Marland Kitchen rOPINIRole (REQUIP) 3 MG tablet Take 3 mg by mouth at bedtime. Pt takes 1 hour before bedtime  3  . simvastatin (ZOCOR) 40 MG tablet Take 1 tablet (40 mg total) by mouth every evening. 30 tablet 1  . solifenacin (VESICARE) 5 MG tablet Take 1 tablet (5 mg total) by mouth daily. (Patient taking differently: Take 5 mg by mouth every morning. ) 30 tablet 1  . tamsulosin (FLOMAX) 0.4 MG CAPS capsule Take 1 capsule (0.4 mg total) by mouth daily after supper. (Patient taking differently: Take 0.4 mg by mouth daily after breakfast. ) 30 capsule 1  . valsartan-hydrochlorothiazide (DIOVAN-HCT) 160-12.5 MG per tablet Take 1 tablet by mouth daily.    . vitamin B-12 (CYANOCOBALAMIN) 100 MCG tablet Take 100 mcg by mouth daily.      No current facility-administered medications for this visit.     Allergies:  Allergies  Allergen Reactions  . Asa [Aspirin]  Nausea Only  . Vicodin [Hydrocodone-Acetaminophen] Nausea Only    Past Medical History, Surgical history, Social history, and Family History were reviewed and updated.   Physical Exam: Blood pressure 137/69, pulse 84, temperature 98 F (36.7 C), temperature source Oral, resp. rate 18, height 5\' 6"  (1.676 m), weight 198 lb (89.812 kg), SpO2 97 %. ECOG: 1 General appearance: alert and cooperative Head: Normocephalic, without obvious abnormality Neck: no adenopathy Lymph nodes: Cervical, supraclavicular, and axillary  nodes normal. Heart:regular rate and rhythm, S1, S2 normal, no murmur, click, rub or gallop Lung:chest clear, no wheezing, rales, normal symmetric air entry Abdomin: soft, non-tender, without masses or organomegaly EXT:no erythema, induration, or nodules   Lab Results: Lab Results  Component Value Date   WBC 7.3 03/06/2015   HGB 12.9* 03/06/2015   HCT 39.9 03/06/2015   MCV 96.6 03/06/2015   PLT 260 03/06/2015     Chemistry      Component Value Date/Time   NA 135 02/10/2015 1300   NA 139 11/25/2014 1414   K 3.6 02/10/2015 1300   K 3.8 11/25/2014 1414   CL 103 02/10/2015 1300   CO2 23 02/10/2015 1300   CO2 23 11/25/2014 1414   BUN 14 02/10/2015 1300   BUN 15.6 01/13/2015 1441   CREATININE 1.02 02/17/2015 2039   CREATININE 1.1 01/13/2015 1441      Component Value Date/Time   CALCIUM 9.3 02/10/2015 1300   CALCIUM 9.2 11/25/2014 1414   ALKPHOS 56 11/25/2014 1414   ALKPHOS 56 10/04/2013 0601   AST 16 11/25/2014 1414   AST 20 10/04/2013 0601   ALT 10 11/25/2014 1414   ALT 59* 10/04/2013 0601   BILITOT 0.24 11/25/2014 1414   BILITOT 0.3 10/04/2013 0601         Impression and Plan:    81 year old gentleman with the following issues:  1. Iron deficiency anemia presenting with a hemoglobin of 8.8, RDW of 16.9 and ferritin level 14.8. These were obtained in February 2016. The etiology is unclear and his GI workup is ongoing. He is status post IV iron in the form of Feraheme for a total of 1 g completed in March 2016. His hemoglobin is back to normal at this time and is completely asymptomatic. I recommended continue oral iron as he is doing and will repeat iron studies in 4 months. He might require repeat IV iron treatments few develops worsening iron deficiency.  He will continue his follow up with gastroenterology regarding his potential source of his iron loss.  2. Prostate cancer: Followed by Dr. Junious Silk.  3. Follow-up: Will be in 4 months to repeat his iron  studies.   [facility], MD 6/17/20161:28 PM

## 2015-03-16 ENCOUNTER — Other Ambulatory Visit: Payer: Self-pay

## 2015-03-20 ENCOUNTER — Encounter: Payer: Self-pay | Admitting: Cardiology

## 2015-03-26 ENCOUNTER — Encounter: Payer: Self-pay | Admitting: Cardiology

## 2015-04-12 ENCOUNTER — Encounter (HOSPITAL_COMMUNITY): Payer: Self-pay | Admitting: *Deleted

## 2015-04-12 ENCOUNTER — Emergency Department (HOSPITAL_COMMUNITY)
Admission: EM | Admit: 2015-04-12 | Discharge: 2015-04-12 | Disposition: A | Discharge status: Home / Self Care | Payer: Medicare Other | Source: Home / Self Care | Attending: Family Medicine | Admitting: Family Medicine

## 2015-04-12 DIAGNOSIS — L509 Urticaria, unspecified: Secondary | ICD-10-CM

## 2015-04-12 MED ORDER — HYDROXYZINE HCL 25 MG PO TABS: 25.0000 mg | ORAL_TABLET | Freq: Four times a day (QID) | ORAL | Status: DC

## 2015-04-12 MED ORDER — TRIAMCINOLONE ACETONIDE 40 MG/ML IJ SUSP
INTRAMUSCULAR | Status: AC
  Filled 2015-04-12: qty 1

## 2015-04-12 MED ORDER — METHYLPREDNISOLONE ACETATE 80 MG/ML IJ SUSP
INTRAMUSCULAR | Status: AC
  Filled 2015-04-12: qty 1

## 2015-04-12 MED ORDER — METHYLPREDNISOLONE ACETATE 40 MG/ML IJ SUSP
80.0000 mg | Freq: Once | INTRAMUSCULAR | Status: AC
  Administered 2015-04-12: 80 mg via INTRAMUSCULAR

## 2015-04-12 MED ORDER — TRIAMCINOLONE ACETONIDE 40 MG/ML IJ SUSP
40.0000 mg | Freq: Once | INTRAMUSCULAR | Status: AC
  Administered 2015-04-12: 40 mg via INTRAMUSCULAR

## 2015-04-12 NOTE — ED Provider Notes (Signed)
CSN: [id number]     Arrival date & time 04/12/15  1428 History   First MD Initiated Contact with Patient 04/12/15 1614     Chief Complaint  Patient presents with  . Rash   (Consider location/radiation/quality/duration/timing/severity/associated sxs/prior Treatment) Patient is a 81 y.o. male presenting with rash. The history is provided by the patient and the spouse.  Rash Location:  Full body Quality: itchiness and redness   Quality: not painful, not scaling, not swelling and not weeping   Severity:  Moderate Onset quality:  Sudden Duration:  1 day Timing:  Constant Context comment:  Unkown exposure Relieved by:  None tried Worsened by:  Nothing tried Ineffective treatments:  None tried Associated symptoms: no abdominal pain, no fever, no nausea, no periorbital edema, no shortness of breath, no throat swelling, no tongue swelling and not wheezing     Past Medical History  Diagnosis Date  . Anemia   . Prostate cancer   . Basal cell cancer   . Hyperlipidemia   . Restless leg   . GERD (gastroesophageal reflux disease)   . Brain tumor   . History of radiation therapy     back approximately 2010 prostate radiation under care of Dr. Valere Dross   . Hypertension   . Herniated disc     lumbar spine  . OSA (obstructive sleep apnea) 10/28/2014    Moderate with AHI 21/hr  . Meningioma     brain  . Hiatal hernia   . Occult blood in stools   . Nocturia    Past Surgical History  Procedure Laterality Date  . Joint replacement      right knee replacement  . Bilateral cateract surgery    . Vasectomy    . Colonoscopy      colon polyp removed  . Craniotomy Right 09/27/2013    Procedure: RIGHT FRONTAL CRANIOTOMY FOR TUMOR RESECTION ;  Surgeon: Consuella Lose, MD;  Location: [place];  Service: Neurosurgery;  Laterality: Right;  . Prostate biopsy    . Tonsillectomy      Agae 6-7  . Craniotomy Right 02/17/2015    Procedure: Right frontal parietal craniotomy for resection of  meningioma with brainlab;  Surgeon: Consuella Lose, MD;  Location: [place] [facility];  Service: Neurosurgery;  Laterality: Right;  Right frontal parietal craniotomy for resection of meningioma with brainlab  . Application of cranial navigation N/A 02/17/2015    Procedure: APPLICATION OF CRANIAL NAVIGATION;  Surgeon: Consuella Lose, MD;  Location: [facility];  Service: Neurosurgery;  Laterality: N/A;  APPLICATION OF CRANIAL NAVIGATION   Family History  Problem Relation Age of Onset  . Parkinsonism Mother   . Dementia Father    History  Substance Use Topics  . Smoking status: Never Smoker   . Smokeless tobacco: Former Systems developer    Types: [place] date: 10/08/1973  . Alcohol Use: No    Review of Systems  Constitutional: Negative.  Negative for fever.  HENT: Negative.   Respiratory: Negative for cough, shortness of breath and wheezing.   Cardiovascular: Negative.   Gastrointestinal: Negative for nausea and abdominal pain.  Skin: Positive for rash.    Allergies  Asa and Vicodin  Home Medications   Prior to Admission medications   Medication Sig Start Date End Date Taking? Authorizing Provider  acetaminophen (TYLENOL) 500 MG tablet Take 1,000 mg by mouth every 8 (eight) hours as needed (pain).    Historical Provider, MD  b complex vitamins tablet Take 1  tablet by mouth daily.    Historical Provider, MD  chlorpheniramine (CHLOR-TRIMETON) 4 MG tablet Take 4 mg by mouth daily.    Historical Provider, MD  famotidine (PEPCID) 20 MG tablet Take 20 mg by mouth at bedtime.    Historical Provider, MD  ferrous sulfate 325 (65 FE) MG tablet Take 650 mg by mouth 2 (two) times daily with a meal.     Historical Provider, MD  hydrOXYzine (ATARAX/VISTARIL) 25 MG tablet Take 1 tablet (25 mg total) by mouth every 6 (six) hours. For itching 04/12/15   Billy Fischer, MD  magnesium gluconate (MAGONATE) 500 MG tablet Take 500 mg by mouth daily.    Historical Provider, MD  omeprazole (PRILOSEC) 20 MG  capsule Take 1 capsule (20 mg total) by mouth daily. 10/18/13   Lavon Paganini Angiulli, PA-C  Probiotic Product (PROBIOTIC DAILY PO) Take 1 capsule by mouth daily.     Historical Provider, MD  rOPINIRole (REQUIP) 3 MG tablet Take 3 mg by mouth at bedtime. Pt takes 1 hour before bedtime 11/18/14   Historical Provider, MD  simvastatin (ZOCOR) 40 MG tablet Take 1 tablet (40 mg total) by mouth every evening. 10/18/13   Lavon Paganini Angiulli, PA-C  solifenacin (VESICARE) 5 MG tablet Take 1 tablet (5 mg total) by mouth daily. Patient taking differently: Take 5 mg by mouth every morning.  10/18/13   Lavon Paganini Angiulli, PA-C  tamsulosin (FLOMAX) 0.4 MG CAPS capsule Take 1 capsule (0.4 mg total) by mouth daily after supper. Patient taking differently: Take 0.4 mg by mouth daily after breakfast.  10/18/13   Lavon Paganini Angiulli, PA-C  valsartan-hydrochlorothiazide (DIOVAN-HCT) 160-12.5 MG per tablet Take 1 tablet by mouth daily.    Historical Provider, MD  vitamin B-12 (CYANOCOBALAMIN) 100 MCG tablet Take 100 mcg by mouth daily.     Historical Provider, MD   BP 97/58 mmHg  Pulse 78  Temp(Src) 98.2 F (36.8 C) (Oral)  Resp 16  SpO2 100% Physical Exam  Constitutional: He is oriented to person, place, and time. He appears well-developed and well-nourished. No distress.  Neck: Normal range of motion. Neck supple.  Cardiovascular: Normal heart sounds and intact distal pulses.   Pulmonary/Chest: Effort normal and breath sounds normal.  Lymphadenopathy:    He has no cervical adenopathy.  Neurological: He is alert and oriented to person, place, and time.  Skin: Skin is warm and dry. Rash noted. There is erythema.  Confluent erythematous dermatitis to flank areas bilat into groin  Nursing note and vitals reviewed.   ED Course  Procedures (including critical care time) Labs Review Labs Reviewed - No data to display  Imaging Review No results found.   MDM   1. Urticarial dermatitis        Billy Fischer,  MD 04/12/15 [phone]

## 2015-04-12 NOTE — ED Notes (Signed)
Pt  Has  Generalized  Rash  Over    Large  Parts  Of    His  Body  Started  This  Am   -  denys  Any  New  meds  Or  Any known  Causative  Agent    The  Rash itches    He  Is  Sitting  Upright     On  Exam table

## 2015-04-21 ENCOUNTER — Encounter: Payer: Self-pay | Admitting: Radiation Therapy

## 2015-04-21 ENCOUNTER — Other Ambulatory Visit: Payer: Self-pay | Admitting: Radiation Therapy

## 2015-04-21 DIAGNOSIS — D329 Benign neoplasm of meninges, unspecified: Secondary | ICD-10-CM

## 2015-04-21 NOTE — Progress Notes (Signed)
1.  Do you need a wheel chair?    no  2. On oxygen? no  3. Have you ever had any surgery in the body part being scanned?  Yes Craniotomy on 09/27/13 and then again on 02/17/15 by Dr. Kathyrn Sheriff to remove a meningioma  4. Have you ever had any surgery on your brain or heart?                     Brain, please see #3.           5. Have you ever had surgery on your eyes or ears?    No             6. Do you have a pacemaker or defibrillator?   no  7. Do you have a Neurostimulator?    no      8. Claustrophobic?  no  9. Any risk for metal in eyes?  no  10. Injury by bullet, buckshot, or shrapnel?  no  11. Stent?         no                                                                                                                12. Hx of Cancer?   No, Recurrent  Meningioma                                                                                                             13. Kidney or Liver disease?  no  14. Hx of Lupus, Rheumatoid Arthritis or Scleroderma?  no  15. IV Antibiotics or long term use of NSAIDS?  no  16. HX of Hypertension?    no  17. Diabetes?  no  18. Allergy to contrast?  no  19. Recent labs. Please draw labs same day prior to scan. Lab orders have been placed for BUN and Creat in [facility].  Mont Dutton

## 2015-05-14 ENCOUNTER — Ambulatory Visit
Admission: RE | Admit: 2015-05-14 | Discharge: 2015-05-14 | Disposition: A | Discharge status: Home / Self Care | Payer: Medicare Other | Source: Ambulatory Visit | Attending: Radiation Oncology | Admitting: Radiation Oncology

## 2015-05-14 DIAGNOSIS — D329 Benign neoplasm of meninges, unspecified: Secondary | ICD-10-CM | POA: Diagnosis not present

## 2015-05-14 LAB — BUN AND CREATININE (CC13)
BUN: 22.7 mg/dL (ref 7.0–26.0)
Creatinine: 1.1 mg/dL (ref 0.7–1.3)
EGFR: 63 mL/min/{1.73_m2} — ABNORMAL LOW (ref >90)

## 2015-05-15 ENCOUNTER — Ambulatory Visit
Admission: RE | Admit: 2015-05-15 | Discharge: 2015-05-15 | Disposition: A | Discharge status: Home / Self Care | Payer: Medicare Other | Source: Ambulatory Visit | Attending: Radiation Oncology | Admitting: Radiation Oncology

## 2015-05-15 DIAGNOSIS — D329 Benign neoplasm of meninges, unspecified: Secondary | ICD-10-CM

## 2015-05-15 MED ORDER — GADOBENATE DIMEGLUMINE 529 MG/ML IV SOLN
18.0000 mL | Freq: Once | INTRAVENOUS | Status: AC | PRN
  Administered 2015-05-15: 18 mL via INTRAVENOUS

## 2015-05-20 ENCOUNTER — Encounter: Payer: Self-pay | Admitting: Radiation Oncology

## 2015-05-20 ENCOUNTER — Ambulatory Visit
Admission: RE | Admit: 2015-05-20 | Discharge: 2015-05-20 | Disposition: A | Discharge status: Home / Self Care | Payer: Medicare Other | Source: Ambulatory Visit | Attending: Radiation Oncology | Admitting: Radiation Oncology

## 2015-05-20 VITALS — BP 136/67 | HR 72 | Resp 16 | Wt 200.1 lb

## 2015-05-20 DIAGNOSIS — D329 Benign neoplasm of meninges, unspecified: Secondary | ICD-10-CM

## 2015-05-20 NOTE — Progress Notes (Signed)
Radiation Oncology         [phone]     Name: [name]  Date: 06/30/2014  MRN: [id number]  DOB: April 04, 1931    Multidisciplinary Brain and Spine Oncology Clinic Follow-Up Visit Note  CC:  Paul Crutch, MD Paul Crutch, MD  Diagnosis: 81 yo man with recurrent grade II meningioma of the right frontoparietal convexity s/p resection followed by salvage fractionated stereotactic radiotherapy to 25 Gy in 5 fractions of 5 Gy through 05/29/2014 followed by salvage resection with new recurrence  Interval Since Last Radiation:  10  months   Narrative:  The patient returns today for routine follow-up. We review the patient's films at brain clinic this morning. Patient is here to review the results of his most recent brain MRI on 05/15/15. Weight and vitals stable. Denies pain. Walking with assistance of a cane. Reports he rarely feels off balance. Reports when he is working his left hand shakes. Reports Saturday he lost use of his left arm for about 30 minutes. Reports fatigue associated with anemia continues. Reports chronic ringing in his ears continues. Denies headache, dizziness, nausea, vomiting or diplopia. Reports he plans to follow with Dr. Maryjean [name] for chronic back pain. Scheduled to follow up with [name] on 07/10/15. [name] recently had a colonoscopy performed.    ALLERGIES:  is allergic to asa and vicodin.  Meds: Current Outpatient Prescriptions  Medication Sig Dispense Refill  . acetaminophen (TYLENOL) 500 MG tablet Take 1,000 mg by mouth every 8 (eight) hours as needed (pain).    Marland Kitchen b complex vitamins tablet Take 1 tablet by mouth daily.    . chlorpheniramine (CHLOR-TRIMETON) 4 MG tablet Take 4 mg by mouth daily.    . famotidine (PEPCID) 20 MG tablet Take 20 mg by mouth at bedtime.    . ferrous sulfate 325 (65 FE) MG tablet Take 650 mg by mouth 2 (two) times daily with a meal.     . hydrOXYzine (ATARAX/VISTARIL) 25 MG tablet Take 1 tablet (25 mg total) by  mouth every 6 (six) hours. For itching 20 tablet 0  . magnesium gluconate (MAGONATE) 500 MG tablet Take 500 mg by mouth daily.    . nitrofurantoin (MACRODANTIN) 100 MG capsule TAKE ONE CAPSULE BY MOUTH EVERY 12 HOURS  0  . omeprazole (PRILOSEC) 20 MG capsule Take 1 capsule (20 mg total) by mouth daily. 30 capsule 1  . Probiotic Product (PROBIOTIC DAILY PO) Take 1 capsule by mouth daily.     Marland Kitchen rOPINIRole (REQUIP) 3 MG tablet Take 3 mg by mouth at bedtime. Pt takes 1 hour before bedtime  3  . solifenacin (VESICARE) 5 MG tablet Take 1 tablet (5 mg total) by mouth daily. (Patient taking differently: Take 5 mg by mouth every morning. ) 30 tablet 1  . tamsulosin (FLOMAX) 0.4 MG CAPS capsule Take 1 capsule (0.4 mg total) by mouth daily after supper. (Patient taking differently: Take 0.4 mg by mouth daily after breakfast. ) 30 capsule 1  . valsartan (DIOVAN) 160 MG tablet   3  . valsartan-hydrochlorothiazide (DIOVAN-HCT) 160-12.5 MG per tablet Take 1 tablet by mouth daily.    . vitamin B-12 (CYANOCOBALAMIN) 100 MCG tablet Take 100 mcg by mouth daily.     . simvastatin (ZOCOR) 40 MG tablet Take 1 tablet (40 mg total) by mouth every evening. (Patient not taking: Reported on 05/20/2015) 30 tablet 1   No current facility-administered medications for this encounter.    Physical Findings: The patient is in  no acute distress. Patient is alert and oriented.  weight is 200 lb 1.6 oz (90.765 kg). His blood pressure is 136/67 and his pulse is 72. His respiration is 16. .  No significant changes.  Lab Findings: Lab Results  Component Value Date   WBC 7.3 03/06/2015   HGB 12.9* 03/06/2015   HCT 39.9 03/06/2015   MCV 96.6 03/06/2015   PLT 260 03/06/2015    @LASTCHEM @  Radiographic Findings: Mr [name] OE Contrast  05/15/2015   CLINICAL DATA:  Meningioma followup. Postop resection x2 with radiation. Also prostate cancer history.  EXAM: MRI HEAD WITHOUT AND WITH CONTRAST  TECHNIQUE: Multiplanar, multiecho  pulse sequences of the brain and surrounding structures were obtained without and with intravenous contrast.  CONTRAST:  60mL MULTIHANCE GADOBENATE DIMEGLUMINE 529 MG/ML IV SOLN  COMPARISON:  MRI 02/18/2015, MRI 01/20/2015  FINDINGS: Right parietal craniotomy for meningioma resection over the convexity. Overall there appears to be progression of meningioma since the prior study. There is dural thickening with nodularity which is likely due to tumor along the dura. In addition several nodular implants have grown significantly including a 9 x 16 mm enhancing mass along the anterior margin of the craniotomy. 9 mm nodule along the posterior aspect of the craniotomy also has progressed. There is tumor filling the superior sagittal sinus as noted previously and this also appears to have progressed. The superior sagittal sinus appears occluded by tumor. There is also some tumor extending through the craniotomy site over the convexity. This may be palpable.  Parenchymal enhancement noted on the prior study in the right parietal lobe may have been related to tumor growth into a sulcus or possibly enhancement related to perioperative infarct. Restricted diffusion was noted in this area on the prior MRI which has resolved today. This area appears slightly improved in the interval however continued enhancement is present in this area.  Ventricle size is normal. No shift of the midline structures. No other enhancing mass lesion identified.  Negative for acute infarct.  IMPRESSION: Postop resection of right convexity meningioma with interval tumor growth since the prior MRI. There is tumor along the dural surface with multiple enhancing nodules which have progressed compatible with tumor. There is tumor filling the superior sagittal sinus which also appears to have progressed. 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  Negative for acute infarct.   Electronically Signed   By: Franchot Gallo M.D.   On: 05/15/2015 13:17     Impression:  Local reccurrence of meningoma with symptoms and swelling.  Given the multiple out-of-field recurrence, the patient may benefit from re-irradiation for salvage treating grossly visible disease plus a margin for microscopic extension.  Re-irradiation does carry a risk of increased complications.  I would recommend conventional conformal fractionated radiation treatment daily for up to 6 weeks at this time.   Plan: We will proceed with simulation and treatment planning on 06/04/15.   This document serves as a record of services personally performed by Tyler Pita, MD. It was created on his behalf by Arlyce Harman, a trained medical scribe. The creation of this record is based on the scribe's personal observations and the provider's statements to them. This document has been checked and approved by the attending provider.    _____________________________________  Sheral Apley. Tammi Klippel, M.D.

## 2015-05-20 NOTE — Progress Notes (Signed)
Weight and vitals stable. Denies pain. Walking with assistance of a cane. Reports he rarely feels off balance. Reports when he is working his left hand shakes. Reports Saturday he lost use of his left arm. Reports fatigue associated with anemia continues. Reports chronic ringing in his ears continues. Denies headache, dizziness, nausea, vomiting or diplopia. Reports he plans to follow with Dr. Maryjean Ka for chronic back pain. Scheduled to follow up with Shadad on 07/10/15.  BP 136/67 mmHg  Pulse 72  Resp 16  Wt 200 lb 1.6 oz (90.765 kg) Wt Readings from Last 3 Encounters:  05/20/15 200 lb 1.6 oz (90.765 kg)  05/15/15 190 lb (86.183 kg)  03/06/15 198 lb (89.812 kg)

## 2015-06-04 ENCOUNTER — Ambulatory Visit
Admission: RE | Admit: 2015-06-04 | Discharge: 2015-06-04 | Disposition: A | Discharge status: Home / Self Care | Payer: Medicare Other | Source: Ambulatory Visit | Attending: Radiation Oncology | Admitting: Radiation Oncology

## 2015-06-04 DIAGNOSIS — D320 Benign neoplasm of cerebral meninges: Secondary | ICD-10-CM | POA: Insufficient documentation

## 2015-06-04 DIAGNOSIS — Z510 Encounter for antineoplastic radiation therapy: Secondary | ICD-10-CM | POA: Diagnosis not present

## 2015-06-04 NOTE — Progress Notes (Signed)
  Radiation Oncology         [phone] ________________________________  Name: [name] MRN: [id number]  Date: 06/04/2015  DOB: 05-Mar-1931  SIMULATION AND TREATMENT PLANNING NOTE    ICD-9-CM ICD-10-CM   1. Meningioma, recurrent of brain 225.2 D32.0     DIAGNOSIS: The patient is an 81 year old gentleman presenting to clinic in regards to his recurrent grade II meningioma of the right frontoparietal convexity s/p resection followed by salvage fractionated stereotactic radiotherapy to 25 Gy in 5 fractions of 5 Gy through 05/29/2014 followed by salvage resection with new recurrence.  NARRATIVE:  The patient was brought to the [facility].  Identity was confirmed.  All relevant records and images related to the planned course of therapy were reviewed.  The patient freely provided informed written consent to proceed with treatment after reviewing the details related to the planned course of therapy. The consent form was witnessed and verified by the simulation staff.  Then, the patient was set-up in a stable reproducible  supine position for radiation therapy.  CT images were obtained.  Surface markings were placed.  The CT images were loaded into the planning software.  Then the target and avoidance structures were contoured.  Treatment planning then occurred.  The radiation prescription was entered and confirmed.  Then, I designed and supervised the construction of a total of 1 medically necessary complex treatment devices, including a custom made thermoplastic mask.   I have requested : Intensity Modulated Radiotherapy (IMRT) is medically necessary for this case for the following reason:  Critical CNS structure avoidance - brainstem, optic chiasm, optic nerve.Marland Kitchen     SPECIAL TREATMENT PROCEDURE:  The planned course of therapy using radiation constitutes a special treatment procedure. Special care is required in the management of this patient for the following reasons. This  treatment constitutes a Special Treatment Procedure for the following reason: [ Retreatment in a previously radiated area requiring careful monitoring of increased risk of toxicity due to overlap of previous treatment..  The special nature of the planned course of radiotherapy will require increased physician supervision and oversight to ensure patient's safety with optimal treatment outcomes.   PLAN:  The whole brain will be treated to 54 Gy in 30 fractions.  This document serves as a record of services personally performed by Tyler Pita, MD. It was created on his behalf by Lenn Cal, a trained medical scribe. The creation of this record is based on the scribe's personal observations and the provider's statements to them. This document has been checked and approved by the attending provider. ________________________________  Sheral Apley. Tammi Klippel, M.D.

## 2015-06-11 DIAGNOSIS — Z510 Encounter for antineoplastic radiation therapy: Secondary | ICD-10-CM | POA: Diagnosis not present

## 2015-06-12 DIAGNOSIS — Z510 Encounter for antineoplastic radiation therapy: Secondary | ICD-10-CM | POA: Diagnosis not present

## 2015-06-15 ENCOUNTER — Ambulatory Visit
Admission: RE | Admit: 2015-06-15 | Discharge: 2015-06-15 | Disposition: A | Discharge status: Home / Self Care | Payer: Medicare Other | Source: Ambulatory Visit | Attending: Radiation Oncology | Admitting: Radiation Oncology

## 2015-06-15 DIAGNOSIS — Z510 Encounter for antineoplastic radiation therapy: Secondary | ICD-10-CM | POA: Diagnosis not present

## 2015-06-16 ENCOUNTER — Ambulatory Visit
Admission: RE | Admit: 2015-06-16 | Discharge: 2015-06-16 | Disposition: A | Discharge status: Home / Self Care | Payer: Medicare Other | Source: Ambulatory Visit | Attending: Radiation Oncology | Admitting: Radiation Oncology

## 2015-06-16 DIAGNOSIS — Z510 Encounter for antineoplastic radiation therapy: Secondary | ICD-10-CM | POA: Diagnosis not present

## 2015-06-17 ENCOUNTER — Ambulatory Visit
Admission: RE | Admit: 2015-06-17 | Discharge: 2015-06-17 | Disposition: A | Discharge status: Home / Self Care | Payer: Medicare Other | Source: Ambulatory Visit | Attending: Radiation Oncology | Admitting: Radiation Oncology

## 2015-06-17 DIAGNOSIS — Z510 Encounter for antineoplastic radiation therapy: Secondary | ICD-10-CM | POA: Diagnosis not present

## 2015-06-18 ENCOUNTER — Ambulatory Visit
Admission: RE | Admit: 2015-06-18 | Discharge: 2015-06-18 | Disposition: A | Discharge status: Home / Self Care | Payer: Medicare Other | Source: Ambulatory Visit | Attending: Radiation Oncology | Admitting: Radiation Oncology

## 2015-06-18 DIAGNOSIS — Z510 Encounter for antineoplastic radiation therapy: Secondary | ICD-10-CM | POA: Diagnosis not present

## 2015-06-19 ENCOUNTER — Ambulatory Visit
Admission: RE | Admit: 2015-06-19 | Discharge: 2015-06-19 | Disposition: A | Discharge status: Home / Self Care | Payer: Medicare Other | Source: Ambulatory Visit | Attending: Radiation Oncology | Admitting: Radiation Oncology

## 2015-06-19 ENCOUNTER — Encounter: Payer: Self-pay | Admitting: Radiation Oncology

## 2015-06-19 VITALS — BP 126/70 | HR 81 | Temp 98.7°F | Ht 66.0 in | Wt 200.2 lb

## 2015-06-19 DIAGNOSIS — Z510 Encounter for antineoplastic radiation therapy: Secondary | ICD-10-CM | POA: Diagnosis not present

## 2015-06-19 DIAGNOSIS — D320 Benign neoplasm of cerebral meninges: Secondary | ICD-10-CM

## 2015-06-19 NOTE — Progress Notes (Signed)
[name] reports that on Tuesday this week he was unable to use his left hand, but now he states he can pick up objects, however, th right hand is weak which is the norm for him.  Denees any nausea, vomiting, nor headaches  Ambulates with cane due to unsteady gait.  Admits to fatigue.

## 2015-06-19 NOTE — Progress Notes (Signed)
  Radiation Oncology         [phone]   Name: [name] MRN: [id number]   Date: 06/19/2015  DOB: Jan 20, 1931   Weekly Radiation Therapy Management    ICD-9-CM ICD-10-CM   1. Meningioma, recurrent of brain (HCC) 225.2 D32.0     Current Dose: 9 Gy  Planned Dose:  54 Gy  Narrative The patient presents for routine under treatment assessment. He has 5 out of 30 treatments are complete.  He reports that on Tuesday this week he was unable to use his left hand, but now he states he can pick up objects. However, the right hand is weak, which is the normal for him.  He denies symptoms of nausea, vomiting, or headaches. He ambulates with a cane due to unsteady gait. Additionally, he confirms symptoms of fatigue. Set-up films were reviewed. The chart was checked. The patient projected a health mental status for today's radiation oncology appointment.  Physical Findings  height is 5\' 6"  (1.676 m) and weight is 200 lb 3.2 oz (90.81 kg). His temperature is 98.7 F (37.1 C). His blood pressure is 126/70 and his pulse is 81.  Weight essentially stable. There is no significant changes to the status of overall health to be noted at this time. The patient is alert and oriented.   Impression The patient is tolerating radiation. He understands the importance of communicating symptoms with both Dr. Tammi Klippel, MD and his dermatologist. He understands why it is not recommended to take steroids due to current symptoms.   Plan The patient has been advised to continue treatment as planned. Healthy methods of management in regards to reported symptoms were reviewed in detail. He is aware of his radiation oncology follow-up appointment to take place next week, as scheduled.  All vocalized questions and concerns have been addressed. If the patient develops any further questions or concerns in regards to his treatment and recovery, he has been encouraged to contact Dr. Tammi Klippel, MD.     This document serves as a record  of services personally performed by Tyler Pita, MD. It was created on his behalf by Lenn Cal, a trained medical scribe. The creation of this record is based on the scribe's personal observations and the provider's statements to them. This document has been checked and approved by the attending provider. ___________________________________________  Sheral Apley. Tammi Klippel, M.D.

## 2015-06-22 ENCOUNTER — Ambulatory Visit
Admission: RE | Admit: 2015-06-22 | Discharge: 2015-06-22 | Disposition: A | Discharge status: Home / Self Care | Payer: Medicare Other | Source: Ambulatory Visit | Attending: Radiation Oncology | Admitting: Radiation Oncology

## 2015-06-22 DIAGNOSIS — Z510 Encounter for antineoplastic radiation therapy: Secondary | ICD-10-CM | POA: Diagnosis not present

## 2015-06-23 ENCOUNTER — Ambulatory Visit
Admission: RE | Admit: 2015-06-23 | Discharge: 2015-06-23 | Disposition: A | Discharge status: Home / Self Care | Payer: Medicare Other | Source: Ambulatory Visit | Attending: Radiation Oncology | Admitting: Radiation Oncology

## 2015-06-23 DIAGNOSIS — Z510 Encounter for antineoplastic radiation therapy: Secondary | ICD-10-CM | POA: Diagnosis not present

## 2015-06-24 ENCOUNTER — Ambulatory Visit
Admission: RE | Admit: 2015-06-24 | Discharge: 2015-06-24 | Disposition: A | Discharge status: Home / Self Care | Payer: Medicare Other | Source: Ambulatory Visit | Attending: Radiation Oncology | Admitting: Radiation Oncology

## 2015-06-24 DIAGNOSIS — Z510 Encounter for antineoplastic radiation therapy: Secondary | ICD-10-CM | POA: Diagnosis not present

## 2015-06-25 ENCOUNTER — Ambulatory Visit
Admission: RE | Admit: 2015-06-25 | Discharge: 2015-06-25 | Disposition: A | Discharge status: Home / Self Care | Payer: Medicare Other | Source: Ambulatory Visit | Attending: Radiation Oncology | Admitting: Radiation Oncology

## 2015-06-25 DIAGNOSIS — Z510 Encounter for antineoplastic radiation therapy: Secondary | ICD-10-CM | POA: Diagnosis not present

## 2015-06-26 ENCOUNTER — Ambulatory Visit
Admission: RE | Admit: 2015-06-26 | Discharge: 2015-06-26 | Disposition: A | Discharge status: Home / Self Care | Payer: Medicare Other | Source: Ambulatory Visit | Attending: Radiation Oncology | Admitting: Radiation Oncology

## 2015-06-26 ENCOUNTER — Encounter: Payer: Self-pay | Admitting: Radiation Oncology

## 2015-06-26 VITALS — BP 118/61 | HR 72 | Resp 16 | Wt 202.7 lb

## 2015-06-26 DIAGNOSIS — D320 Benign neoplasm of cerebral meninges: Secondary | ICD-10-CM

## 2015-06-26 DIAGNOSIS — Z510 Encounter for antineoplastic radiation therapy: Secondary | ICD-10-CM | POA: Diagnosis not present

## 2015-06-26 DIAGNOSIS — D420 Neoplasm of uncertain behavior of cerebral meninges: Secondary | ICD-10-CM

## 2015-06-26 MED ORDER — BIAFINE EX EMUL
Freq: Every day | CUTANEOUS | Status: DC
  Administered 2015-06-26: 14:00:00 via TOPICAL

## 2015-06-26 NOTE — Progress Notes (Signed)
  Radiation Oncology         [phone]   Name: [name] MRN: [id number]   Date: 06/26/2015  DOB: [birth year]   Weekly Radiation Therapy Management    ICD-9-CM ICD-10-CM   1. 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 Meningioma, recurrent of brain (HCC) 225.2 D32.0     Current Dose: 18 Gy  Planned Dose:  54 Gy  Narrative The patient presents for routine under treatment assessment. He has completed 10 out of 30 treatments. The patient denies pain.  Hyperpigmentation with dermatitis of forehead was noted by the nurse and she, London Pepper, RN, provided the patient with Biafine cream and directed the patient upon use. He denies headaches, dizziness, nausea, vomiting, or diplopia. Reports ringing in the left ear continues. Denies taking decadron at this time. Reports that intermittent episodes of numbness and loss of control in left arm continue. He reports this symptom appeared before the start of his radiation treatments.  Set-up films were reviewed. The chart was checked.  Physical Findings  weight is 202 lb 11.2 oz (91.944 kg). His blood pressure is 118/61 and his pulse is 72. His respiration is 16 and oxygen saturation is 100%.  Weight essentially stable. There is no significant changes to the status of overall health to be noted at this time. The patient is alert and oriented. Ambulatory with a cane. Erythematous changes to pre-cancerous solar keratoses  on the scalp from radiation.   Impression The patient is tolerating radiation. He understands the importance of communicating symptoms with both Dr. Tammi Klippel, MD and his dermatologist.   Plan The patient has been advised to continue treatment as planned. Healthy methods of management in regards to reported symptoms were reviewed in detail. He is aware of his radiation oncology follow-up appointment to take place next week, as scheduled.  Biafine was provided to the patient.  All vocalized  questions and concerns have been addressed. If the patient develops any further questions or concerns in regards to his treatment and recovery, he has been encouraged to contact Dr. Tammi Klippel, MD.    This document serves as a record of services personally performed by Tyler Pita, MD. It was created on his behalf by Darcus Austin, a trained medical scribe. The creation of this record is based on the scribe's personal observations and the provider's statements to them. This document has been checked and approved by the attending provider.    ___________________________________________  Sheral Apley. Tammi Klippel, M.D.

## 2015-06-26 NOTE — Progress Notes (Addendum)
Weight and vitals stable. Denies pain. Ambulates with aid of cane. Hyperpigmentation with dermatitis of forehead noted. Provided patient with Biafine cream and directed upon use. Denies headache, dizziness, nausea, vomiting, or diplopia. Reports ringing in the left ear continues. Denies taking decadron at this time. Reports intermittent episodes of numbness and loss of control in left arm continue.  BP 118/61 mmHg  Pulse 72  Resp 16  Wt 202 lb 11.2 oz (91.944 kg)  SpO2 100% Wt Readings from Last 3 Encounters:  06/26/15 202 lb 11.2 oz (91.944 kg)  06/19/15 200 lb 3.2 oz (90.81 kg)  05/20/15 200 lb 1.6 oz (90.765 kg)

## 2015-06-29 ENCOUNTER — Ambulatory Visit
Admission: RE | Admit: 2015-06-29 | Discharge: 2015-06-29 | Disposition: A | Discharge status: Home / Self Care | Payer: Medicare Other | Source: Ambulatory Visit | Attending: Radiation Oncology | Admitting: Radiation Oncology

## 2015-06-29 DIAGNOSIS — Z510 Encounter for antineoplastic radiation therapy: Secondary | ICD-10-CM | POA: Diagnosis not present

## 2015-06-30 ENCOUNTER — Ambulatory Visit
Admission: RE | Admit: 2015-06-30 | Discharge: 2015-06-30 | Disposition: A | Discharge status: Home / Self Care | Payer: Medicare Other | Source: Ambulatory Visit | Attending: Radiation Oncology | Admitting: Radiation Oncology

## 2015-06-30 DIAGNOSIS — Z510 Encounter for antineoplastic radiation therapy: Secondary | ICD-10-CM | POA: Diagnosis not present

## 2015-07-01 ENCOUNTER — Ambulatory Visit
Admission: RE | Admit: 2015-07-01 | Discharge: 2015-07-01 | Disposition: A | Discharge status: Home / Self Care | Payer: Medicare Other | Source: Ambulatory Visit | Attending: Radiation Oncology | Admitting: Radiation Oncology

## 2015-07-01 DIAGNOSIS — Z510 Encounter for antineoplastic radiation therapy: Secondary | ICD-10-CM | POA: Diagnosis not present

## 2015-07-02 ENCOUNTER — Encounter: Payer: Self-pay | Admitting: Radiation Oncology

## 2015-07-02 ENCOUNTER — Ambulatory Visit
Admission: RE | Admit: 2015-07-02 | Discharge: 2015-07-02 | Disposition: A | Discharge status: Home / Self Care | Payer: Medicare Other | Source: Ambulatory Visit | Attending: Radiation Oncology | Admitting: Radiation Oncology

## 2015-07-02 VITALS — BP 110/62 | HR 73 | Resp 16 | Wt 202.7 lb

## 2015-07-02 DIAGNOSIS — D320 Benign neoplasm of cerebral meninges: Secondary | ICD-10-CM

## 2015-07-02 DIAGNOSIS — Z510 Encounter for antineoplastic radiation therapy: Secondary | ICD-10-CM | POA: Diagnosis not present

## 2015-07-02 NOTE — Progress Notes (Signed)
Weight and vitals stable. Denies pain. Ambulates with the aid of a cane. Hyperpigmentation of scalp noted. Reports using biafine as directed. Denies headache, dizziness, nausea , vomiting or diplopia. Reports fatigue continues. Reports ringing in the left ear continues unchanged. Denies taking decadron. Denies any episodes of numbness in his left arm or loss of control in one week.   BP 110/62 mmHg  Pulse 73  Resp 16  Wt 202 lb 11.2 oz (91.944 kg)  SpO2 100% Wt Readings from Last 3 Encounters:  07/02/15 202 lb 11.2 oz (91.944 kg)  06/26/15 202 lb 11.2 oz (91.944 kg)  06/19/15 200 lb 3.2 oz (90.81 kg)

## 2015-07-02 NOTE — Progress Notes (Signed)
  Radiation Oncology         [phone]   Name: [name] MRN: [id number]   Date: 07/02/2015  DOB: 02/09/1931   Weekly Radiation Therapy Management    ICD-9-CM ICD-10-CM   1. Meningioma, recurrent of brain ([name]) 225.2 D32.0     Current Dose: 25.2 Gy  Planned Dose:  54 Gy  Narrative The patient presents for routine under treatment assessment.   Weight and vitals stable. Denies pain. Ambulates with the aid of a cane. Hyperpigmentation of scalp noted. Reports using biafine as directed. Denies headache, dizziness, nausea , vomiting or diplopia. Reports fatigue continues. Reports ringing in the left ear continues unchanged. Denies taking decadron. Denies any episodes of numbness in his left arm or loss of control in one week  Set-up films were reviewed. The chart was checked.  Physical Findings  weight is 202 lb 11.2 oz (91.944 kg). His blood pressure is 110/62 and his pulse is 73. His respiration is 16 and oxygen saturation is 100%.  Weight essentially stable. There is no significant changes to the status of overall health to be noted at this time.    Impression The patient is tolerating radiation. He understands the importance of communicating symptoms with both Dr. Tammi Klippel, MD and his dermatologist.   Plan The patient has been advised to continue treatment as planned. Healthy methods of management in regards to reported symptoms were reviewed in detail. He is aware of his radiation oncology follow-up appointment to take place next week, as scheduled.  All vocalized questions and concerns have been addressed. If the patient develops any further questions or concerns in regards to his treatment and recovery, he has been encouraged to contact Dr. Tammi Klippel, MD.    This document serves as a record of services personally performed by Tyler Pita, MD. It was created on his behalf by Arlyce Harman, a trained medical scribe. The creation of this record is based on the scribe's personal  observations and the provider's statements to them. This document has been checked and approved by the attending provider.    ___________________________________________  Sheral Apley. Tammi Klippel, M.D.

## 2015-07-03 ENCOUNTER — Ambulatory Visit
Admission: RE | Admit: 2015-07-03 | Discharge: 2015-07-03 | Disposition: A | Discharge status: Home / Self Care | Payer: Medicare Other | Source: Ambulatory Visit | Attending: Radiation Oncology | Admitting: Radiation Oncology

## 2015-07-03 DIAGNOSIS — Z510 Encounter for antineoplastic radiation therapy: Secondary | ICD-10-CM | POA: Diagnosis not present

## 2015-07-06 ENCOUNTER — Ambulatory Visit
Admission: RE | Admit: 2015-07-06 | Discharge: 2015-07-06 | Disposition: A | Discharge status: Home / Self Care | Payer: Medicare Other | Source: Ambulatory Visit | Attending: Radiation Oncology | Admitting: Radiation Oncology

## 2015-07-06 DIAGNOSIS — Z510 Encounter for antineoplastic radiation therapy: Secondary | ICD-10-CM | POA: Diagnosis not present

## 2015-07-07 ENCOUNTER — Ambulatory Visit
Admission: RE | Admit: 2015-07-07 | Discharge: 2015-07-07 | Disposition: A | Discharge status: Home / Self Care | Payer: Medicare Other | Source: Ambulatory Visit | Attending: Radiation Oncology | Admitting: Radiation Oncology

## 2015-07-07 DIAGNOSIS — Z510 Encounter for antineoplastic radiation therapy: Secondary | ICD-10-CM | POA: Diagnosis not present

## 2015-07-08 ENCOUNTER — Ambulatory Visit
Admission: RE | Admit: 2015-07-08 | Discharge: 2015-07-08 | Disposition: A | Discharge status: Home / Self Care | Payer: Medicare Other | Source: Ambulatory Visit | Attending: Radiation Oncology | Admitting: Radiation Oncology

## 2015-07-08 DIAGNOSIS — Z510 Encounter for antineoplastic radiation therapy: Secondary | ICD-10-CM | POA: Diagnosis not present

## 2015-07-09 ENCOUNTER — Ambulatory Visit
Admission: RE | Admit: 2015-07-09 | Discharge: 2015-07-09 | Disposition: A | Discharge status: Home / Self Care | Payer: Medicare Other | Source: Ambulatory Visit | Attending: Radiation Oncology | Admitting: Radiation Oncology

## 2015-07-09 DIAGNOSIS — Z510 Encounter for antineoplastic radiation therapy: Secondary | ICD-10-CM | POA: Diagnosis not present

## 2015-07-10 ENCOUNTER — Encounter: Payer: Self-pay | Admitting: Radiation Oncology

## 2015-07-10 ENCOUNTER — Ambulatory Visit (HOSPITAL_BASED_OUTPATIENT_CLINIC_OR_DEPARTMENT_OTHER): Payer: Medicare Other | Admitting: Oncology

## 2015-07-10 ENCOUNTER — Ambulatory Visit
Admission: RE | Admit: 2015-07-10 | Discharge: 2015-07-10 | Disposition: A | Discharge status: Home / Self Care | Payer: Medicare Other | Source: Ambulatory Visit | Attending: Radiation Oncology | Admitting: Radiation Oncology

## 2015-07-10 ENCOUNTER — Other Ambulatory Visit (HOSPITAL_BASED_OUTPATIENT_CLINIC_OR_DEPARTMENT_OTHER): Payer: Medicare Other

## 2015-07-10 ENCOUNTER — Telehealth: Payer: Self-pay | Admitting: Oncology

## 2015-07-10 VITALS — BP 131/63 | HR 64 | Temp 98.2°F | Ht 66.0 in | Wt 204.4 lb

## 2015-07-10 VITALS — BP 158/71 | HR 72 | Temp 97.8°F | Resp 20 | Ht 66.0 in | Wt 204.7 lb

## 2015-07-10 DIAGNOSIS — D500 Iron deficiency anemia secondary to blood loss (chronic): Secondary | ICD-10-CM

## 2015-07-10 DIAGNOSIS — D509 Iron deficiency anemia, unspecified: Secondary | ICD-10-CM

## 2015-07-10 DIAGNOSIS — D420 Neoplasm of uncertain behavior of cerebral meninges: Secondary | ICD-10-CM

## 2015-07-10 DIAGNOSIS — N179 Acute kidney failure, unspecified: Secondary | ICD-10-CM

## 2015-07-10 DIAGNOSIS — K567 Ileus, unspecified: Secondary | ICD-10-CM

## 2015-07-10 DIAGNOSIS — C700 Malignant neoplasm of cerebral meninges: Secondary | ICD-10-CM | POA: Diagnosis not present

## 2015-07-10 DIAGNOSIS — E785 Hyperlipidemia, unspecified: Secondary | ICD-10-CM

## 2015-07-10 DIAGNOSIS — D320 Benign neoplasm of cerebral meninges: Secondary | ICD-10-CM

## 2015-07-10 DIAGNOSIS — Z510 Encounter for antineoplastic radiation therapy: Secondary | ICD-10-CM | POA: Diagnosis not present

## 2015-07-10 DIAGNOSIS — Z8546 Personal history of malignant neoplasm of prostate: Secondary | ICD-10-CM | POA: Diagnosis not present

## 2015-07-10 DIAGNOSIS — G2581 Restless legs syndrome: Secondary | ICD-10-CM

## 2015-07-10 LAB — CBC WITH DIFFERENTIAL/PLATELET
BASO%: 0.7 % (ref 0.0–2.0)
Basophils Absolute: 0.1 10*3/uL (ref 0.0–0.1)
EOS%: 1.5 % (ref 0.0–7.0)
Eosinophils Absolute: 0.1 10*3/uL (ref 0.0–0.5)
HEMATOCRIT: 43.9 % (ref 38.4–49.9)
HEMOGLOBIN: 14.4 g/dL (ref 13.0–17.1)
LYMPH#: 1.3 10*3/uL (ref 0.9–3.3)
LYMPH%: 16.9 % (ref 14.0–49.0)
MCH: 31.5 pg (ref 27.2–33.4)
MCHC: 32.7 g/dL (ref 32.0–36.0)
MCV: 96.3 fL (ref 79.3–98.0)
MONO#: 0.7 10*3/uL (ref 0.1–0.9)
MONO%: 9.1 % (ref 0.0–14.0)
NEUT#: 5.6 10*3/uL (ref 1.5–6.5)
NEUT%: 71.8 % (ref 39.0–75.0)
Platelets: 283 10*3/uL (ref 140–400)
RBC: 4.56 10*6/uL (ref 4.20–5.82)
RDW: 13.4 % (ref 11.0–14.6)
WBC: 7.9 10*3/uL (ref 4.0–10.3)

## 2015-07-10 LAB — IRON AND TIBC CHCC
%SAT: 17 % — ABNORMAL LOW (ref 20–55)
IRON: 60 ug/dL (ref 42–163)
TIBC: 363 ug/dL (ref 202–409)
UIBC: 302 ug/dL (ref 117–376)

## 2015-07-10 LAB — FERRITIN CHCC: Ferritin: 32 ng/ml (ref 22–316)

## 2015-07-10 MED ORDER — BIAFINE EX EMUL
CUTANEOUS | Status: DC | PRN
  Administered 2015-07-10: 14:00:00 via TOPICAL

## 2015-07-10 MED ORDER — SONAFINE EX EMUL: 1.0000 "application " | Freq: Two times a day (BID) | CUTANEOUS | Status: DC

## 2015-07-10 NOTE — Progress Notes (Signed)
Department of Radiation Oncology  Phone:  [phone] Fax:        [phone]  Weekly Treatment Note    Name: [name] Date: 07/10/2015 MRN: [id number] DOB: [birth year]   Current dose: 36 Gy  Current fraction:20   MEDICATIONS: Current Outpatient Prescriptions  Medication Sig Dispense Refill  . acetaminophen (TYLENOL) 500 MG tablet Take 1,000 mg by mouth every 8 (eight) hours as needed (pain).    Marland Kitchen b complex vitamins tablet Take 1 tablet by mouth daily.    Marland Kitchen emollient (BIAFINE) cream Apply topically as needed.    . famotidine (PEPCID) 20 MG tablet Take 20 mg by mouth at bedtime.    . ferrous sulfate 325 (65 FE) MG tablet Take 650 mg by mouth 1 day or 1 dose.     . magnesium gluconate (MAGONATE) 500 MG tablet Take 500 mg by mouth daily.    Marland Kitchen omeprazole (PRILOSEC) 20 MG capsule Take 1 capsule (20 mg total) by mouth daily. 30 capsule 1  . Probiotic Product (PROBIOTIC DAILY PO) Take 1 capsule by mouth daily.     Marland Kitchen rOPINIRole (REQUIP) 3 MG tablet Take 3 mg by mouth at bedtime. Pt takes 1 hour before bedtime  3  . solifenacin (VESICARE) 5 MG tablet Take 1 tablet (5 mg total) by mouth daily. 30 tablet 1  . tamsulosin (FLOMAX) 0.4 MG CAPS capsule Take 1 capsule (0.4 mg total) by mouth daily after supper. (Patient taking differently: Take 0.4 mg by mouth daily after breakfast. ) 30 capsule 1  . vitamin B-12 (CYANOCOBALAMIN) 100 MCG tablet Take 100 mcg by mouth daily.     . simvastatin (ZOCOR) 40 MG tablet Take 1 tablet (40 mg total) by mouth every evening. (Patient not taking: Reported on 05/20/2015) 30 tablet 1  . valsartan (DIOVAN) 160 MG tablet   3  . valsartan-hydrochlorothiazide (DIOVAN-HCT) 160-12.5 MG per tablet Take 1 tablet by mouth daily.     No current facility-administered medications for this encounter.     ALLERGIES: Asa and Vicodin   LABORATORY DATA:  Lab Results  Component Value Date   WBC 7.3 03/06/2015   HGB 12.9* 03/06/2015   HCT 39.9 03/06/2015   MCV  96.6 03/06/2015   PLT 260 03/06/2015   Lab Results  Component Value Date   NA 135 02/10/2015   K 3.6 02/10/2015   CL 103 02/10/2015   CO2 23 02/10/2015   Lab Results  Component Value Date   ALT 10 11/25/2014   AST 16 11/25/2014   ALKPHOS 56 11/25/2014   BILITOT 0.24 11/25/2014     NARRATIVE: [name] was seen today for weekly treatment management. The chart was checked and the patient's films were reviewed.  [name] denies any blurred or double vision, nor vision changes. He ambulates with a cane, but denies any changes. Note dryness and erythema of scalp region and he is concerned about a nodule area on his surgical incisional region, right. The patient was given additional skin cream to help with irritation today.   PHYSICAL EXAMINATION: height is 5\' 6"  (1.676 m) and weight is 204 lb 6.4 oz (92.715 kg). His temperature is 98.2 F (36.8 C). His blood pressure is 131/63 and his pulse is 64.      Some erythema and diffuse dryness of skin in the scalp. Patient has some underlying thickening centrally at the surgical incision site. This area is prominent but no sign of infection or other acute problems.  ASSESSMENT:  The patient is doing satisfactorily with treatment.  PLAN: We will continue with the patient's radiation treatment as planned. We will continue to monitor the nodule area on his surgical incisional region, right.  This document serves as a record of services personally performed by Kyung Rudd, MD. It was created on his behalf by Arlyce Harman, a trained medical scribe. The creation of this record is based on the scribe's personal observations and the provider's statements to them. This document has been checked and approved by the attending provider. ------------------------------------------------  Jodelle Gross, MD, PhD

## 2015-07-10 NOTE — Addendum Note (Signed)
Encounter addended by: Benn Moulder, RN on: 07/10/2015  2:59 PM<BR>     Documentation filed: Visit Diagnoses, Dx Association, Inpatient MAR, Orders

## 2015-07-10 NOTE — Progress Notes (Signed)
Hematology and Oncology Follow Up Visit  [name] [id number] 1931/07/26 81 y.o. 07/10/2015 1:30 PM  [name], MDRoss, Paul Crumble, MD   Principle Diagnosis: 81 year old gentleman with iron deficiency anemia diagnosed in February 2016. Presented with hemoglobin of 8.8, RDW of 16.9 and ferritin of 14.8. His GI workup so far has been unrevealing.   Prior Therapy: Status post IV iron for a total of 1 g given in March 2016.  Current therapy: Oral iron supplements. He is taking 325 mg tablet once a day.  Interim History: [name] presents today for a follow-up visit. Since the last visit, he has been doing relatively fair. He has been diagnosed with brain tumor in the form meningioma and had surgical resection in 2015 and most recently in May 2016. He did have disease recurrence and currently receiving radiation therapy for that.   He continues to be on oral iron once a day and his hemoglobin continued to be within normal range. His energy and performance status has declined some after the start of radiation therapy which causes him some increased fatigue. He has not reported any hematochezia or melena.  He does not report any abdominal pain or dyspepsia. He still have some lower extremity weakness which has been chronic in nature. He does not report any headaches, blurry vision, double vision or syncope or seizures. He does not report any chest pain, palpitation or leg edema or orthopnea. He does report wheezing and exertional dyspnea but no cough or hemoptysis. He does not report any nausea, vomiting, abdominal pain, early satiety. He does not report any frequency, urgency or hesitancy. He does not report any skeletal complaints. Rest of his review of systems unremarkable.   Medications: I have reviewed the patient's current medications.  Current Outpatient Prescriptions  Medication Sig Dispense Refill  . acetaminophen (TYLENOL) 500 MG tablet Take 1,000 mg by mouth every 8 (eight) hours as needed  (pain).    Marland Kitchen b complex vitamins tablet Take 1 tablet by mouth daily.    Marland Kitchen emollient (BIAFINE) cream Apply topically as needed.    . famotidine (PEPCID) 20 MG tablet Take 20 mg by mouth at bedtime.    . ferrous sulfate 325 (65 FE) MG tablet Take 650 mg by mouth 1 day or 1 dose.     . magnesium gluconate (MAGONATE) 500 MG tablet Take 500 mg by mouth daily.    Marland Kitchen omeprazole (PRILOSEC) 20 MG capsule Take 1 capsule (20 mg total) by mouth daily. 30 capsule 1  . Probiotic Product (PROBIOTIC DAILY PO) Take 1 capsule by mouth daily.     Marland Kitchen rOPINIRole (REQUIP) 3 MG tablet Take 3 mg by mouth at bedtime. Pt takes 1 hour before bedtime  3  . simvastatin (ZOCOR) 40 MG tablet Take 1 tablet (40 mg total) by mouth every evening. 30 tablet 1  . solifenacin (VESICARE) 5 MG tablet Take 1 tablet (5 mg total) by mouth daily. 30 tablet 1  . tamsulosin (FLOMAX) 0.4 MG CAPS capsule Take 1 capsule (0.4 mg total) by mouth daily after supper. (Patient taking differently: Take 0.4 mg by mouth daily after breakfast. ) 30 capsule 1  . valsartan (DIOVAN) 160 MG tablet   3  . valsartan-hydrochlorothiazide (DIOVAN-HCT) 160-12.5 MG per tablet Take 1 tablet by mouth daily.    . vitamin B-12 (CYANOCOBALAMIN) 100 MCG tablet Take 100 mcg by mouth daily.      No current facility-administered medications for this visit.     Allergies:  Allergies  Allergen Reactions  . Asa [Aspirin] Nausea Only  . Vicodin [Hydrocodone-Acetaminophen] Nausea Only    Past Medical History, Surgical history, Social history, and Family History were reviewed and updated.   Physical Exam: Blood pressure 158/71, pulse 72, temperature 97.8 F (36.6 C), temperature source Oral, resp. rate 20, height 5\' 6"  (1.676 m), weight 204 lb 11.2 oz (92.851 kg), SpO2 98 %. ECOG: 1 General appearance: alert and cooperative appeared in no active distress. Head: Normocephalic, without obvious abnormality his scalp showed mild erythema Neck: no adenopathy Lymph  nodes: Cervical, supraclavicular, and axillary nodes normal. Heart:regular rate and rhythm, S1, S2 normal, no murmur, click, rub or gallop Lung:chest clear, no wheezing, rales, normal symmetric air entry Abdomin: soft, non-tender, without masses or organomegaly EXT:no erythema, induration, or nodules   Lab Results: Lab Results  Component Value Date   WBC 7.9 07/10/2015   HGB 14.4 07/10/2015   HCT 43.9 07/10/2015   MCV 96.3 07/10/2015   PLT 283 07/10/2015     Chemistry      Component Value Date/Time   NA 135 02/10/2015 1300   NA 139 11/25/2014 1414   K 3.6 02/10/2015 1300   K 3.8 11/25/2014 1414   CL 103 02/10/2015 1300   CO2 23 02/10/2015 1300   CO2 23 11/25/2014 1414   BUN 22.7 05/14/2015 0959   BUN 14 02/10/2015 1300   CREATININE 1.1 05/14/2015 0959   CREATININE 1.02 02/17/2015 2039      Component Value Date/Time   CALCIUM 9.3 02/10/2015 1300   CALCIUM 9.2 11/25/2014 1414   ALKPHOS 56 11/25/2014 1414   ALKPHOS 56 10/04/2013 0601   AST 16 11/25/2014 1414   AST 20 10/04/2013 0601   ALT 10 11/25/2014 1414   ALT 59* 10/04/2013 0601   BILITOT 0.24 11/25/2014 1414   BILITOT 0.3 10/04/2013 0601         Impression and Plan:    81 year old gentleman with the following issues:  1. Iron deficiency anemia presenting with a hemoglobin of 8.8, RDW of 16.9 and ferritin level 14.8 obtained in February 2016. The etiology is unclear and his GI workup is ongoing. He is status post IV iron in the form of Feraheme for a total of 1 g completed in March 2016.   His hemoglobin have normalized at this time and continue to be on oral iron replacement once a day. I have recommended continue this for the time being and does not need any further IV iron.  2. Prostate cancer: Followed by Dr. Junious Silk. No evidence of recurrence.  3. Meningioma: Status post surgical resection and currently receiving radiation therapy.  3. Follow-up: I'll be happy to see him in the future as needed. He  gets a routine laboratory testing with his primary care provider as well as gastroenterologist and will evaluate him in the future if needed to if his iron levels dropped down dramatically.   OHYWVP,XTGGY, MD 10/21/20161:30 PM

## 2015-07-10 NOTE — Telephone Encounter (Signed)
Mailed pt appt sched avs adn letter

## 2015-07-10 NOTE — Progress Notes (Signed)
[name] denies any blurred or double vision, nor vision changes. He ambulates with a cane, but denies any changes.  Note dryness and erythema of scalp region and  He is concerned about a nodule area on his his surgical incisional region, right.

## 2015-07-13 ENCOUNTER — Ambulatory Visit
Admission: RE | Admit: 2015-07-13 | Discharge: 2015-07-13 | Disposition: A | Discharge status: Home / Self Care | Payer: Medicare Other | Source: Ambulatory Visit | Attending: Radiation Oncology | Admitting: Radiation Oncology

## 2015-07-13 ENCOUNTER — Telehealth: Payer: Self-pay | Admitting: Radiation Oncology

## 2015-07-13 ENCOUNTER — Encounter: Payer: Self-pay | Admitting: Radiation Oncology

## 2015-07-13 VITALS — BP 131/69 | HR 67 | Temp 97.8°F | Resp 16 | Wt 203.1 lb

## 2015-07-13 DIAGNOSIS — D420 Neoplasm of uncertain behavior of cerebral meninges: Secondary | ICD-10-CM

## 2015-07-13 DIAGNOSIS — Z510 Encounter for antineoplastic radiation therapy: Secondary | ICD-10-CM | POA: Diagnosis not present

## 2015-07-13 MED ORDER — LEVETIRACETAM 500 MG PO TABS: 500.0000 mg | ORAL_TABLET | Freq: Two times a day (BID) | ORAL | Status: DC

## 2015-07-13 NOTE — Telephone Encounter (Addendum)
Patient phoned to report after working in the yard for several hours on Saturday he sat down to rest and his left arm began to shake. Patient concerned a seizure was coming on attempted to head in to the house. Unfortunately, he was unable to walk or call out to his wife for help. He states, "my whole body shook." He reports this lasted for what felt like some time. Patient explains that eventually his wife found him and was able to get him into the house. He reports he rested for the remainder of the day and Sunday morning he was 95% better. Patient requested to be evaluated today by Dr. Tammi Klippel to ensure he "didn't have a stroke on Saturday." Phoned L4 and requested the patient be sent to nursing following treatment today.

## 2015-07-13 NOTE — Progress Notes (Signed)
Weight and vitals stable. Denies pain. See telephone progress note from events Saturday. Denies headache, dizziness, nausea, vomiting, or diplopia. Reports ringing in left ear continues.   BP 131/69 mmHg  Pulse 67  Temp(Src) 97.8 F (36.6 C) (Oral)  Resp 16  Wt 203 lb 1.6 oz (92.126 kg)  SpO2 100% Wt Readings from Last 3 Encounters:  07/13/15 203 lb 1.6 oz (92.126 kg)  07/10/15 204 lb 11.2 oz (92.851 kg)  07/10/15 204 lb 6.4 oz (92.715 kg)

## 2015-07-13 NOTE — Progress Notes (Signed)
  Radiation Oncology         [phone]   Name: [name] MRN: [id number]   Date: 07/13/2015  DOB: 05-31-1931   Weekly Radiation Therapy Management    ICD-9-CM ICD-10-CM   1. Atypical meningioma of brain ([name]) 237.6 D42.0 Ambulatory referral to Neurology     levETIRAcetam (KEPPRA) 500 MG tablet    Current Dose: 37.8 Gy  Planned Dose:  54 Gy  Narrative The patient presents for routine under treatment assessment.   Denies pain. Denies headache, dizziness, nausea, vomiting, or diplopia. Reports ringing in left ear continues. Patient phoned earlier this morning to report after working in the yard for several hours on Saturday he sat down to rest and his left arm began to violently shake. The patient was concerned that he was experiencing a seizure and attempted to head back into the house. Unfortunately, he was unable to walk or call out to his wife for help. He states, "my whole body shook." He reports this lasted for a while. Patient explains that eventually his wife found him and was able to get him into the house. He reports he rested for the remainder of the day and Sunday morning he felt "95%" better. Patient requested to be evaluated today to ensure he "didn't have a stroke on Saturday." He denies losing consciousness during that event.  Set-up films were reviewed. The chart was checked.  Physical Findings  weight is 203 lb 1.6 oz (92.126 kg). His oral temperature is 97.8 F (36.6 C). His blood pressure is 131/69 and his pulse is 67. His respiration is 16 and oxygen saturation is 100%.  Weight essentially stable. There is no significant changes to the status of overall health to be noted at this time. The patient is ambulatory with a cane. Erythema and exacerbation of solar keratoses noted on his scalp.   Impression The patient is tolerating radiation relatively well.  I believe that the patient experienced a partial motor seizure last Saturday without loss of consciousness, bowel, or  bladder. He may benefit from [place].   Plan The patient has been advised to continue treatment as planned. Healthy methods of management in regards to reported symptoms were reviewed in detail. He is aware of his radiation oncology follow-up appointment to take place next week, as scheduled.  I have notified Dr. Kathyrn Sheriff of my findings. I will prescribe Keppra. I advised the patient to continue applying biafine and neosporin on his scalp.  All vocalized questions and concerns have been addressed. If the patient develops any further questions or concerns in regards to his treatment and recovery, he has been encouraged to contact me.    This document serves as a record of services personally performed by Tyler Pita, MD. It was created on his behalf by Darcus Austin, a trained medical scribe. The creation of this record is based on the scribe's personal observations and the provider's statements to them. This document has been checked and approved by the attending provider.     ___________________________________________  Sheral Apley. Tammi Klippel, M.D.

## 2015-07-14 ENCOUNTER — Ambulatory Visit
Admission: RE | Admit: 2015-07-14 | Discharge: 2015-07-14 | Disposition: A | Discharge status: Home / Self Care | Payer: Medicare Other | Source: Ambulatory Visit | Attending: Radiation Oncology | Admitting: Radiation Oncology

## 2015-07-14 DIAGNOSIS — Z510 Encounter for antineoplastic radiation therapy: Secondary | ICD-10-CM | POA: Diagnosis not present

## 2015-07-15 ENCOUNTER — Ambulatory Visit
Admission: RE | Admit: 2015-07-15 | Discharge: 2015-07-15 | Disposition: A | Discharge status: Home / Self Care | Payer: Medicare Other | Source: Ambulatory Visit | Attending: Radiation Oncology | Admitting: Radiation Oncology

## 2015-07-15 DIAGNOSIS — Z510 Encounter for antineoplastic radiation therapy: Secondary | ICD-10-CM | POA: Diagnosis not present

## 2015-07-16 ENCOUNTER — Ambulatory Visit
Admission: RE | Admit: 2015-07-16 | Discharge: 2015-07-16 | Disposition: A | Discharge status: Home / Self Care | Payer: Medicare Other | Source: Ambulatory Visit | Attending: Radiation Oncology | Admitting: Radiation Oncology

## 2015-07-16 DIAGNOSIS — Z510 Encounter for antineoplastic radiation therapy: Secondary | ICD-10-CM | POA: Diagnosis not present

## 2015-07-16 NOTE — Telephone Encounter (Signed)
Error

## 2015-07-17 ENCOUNTER — Encounter: Payer: Self-pay | Admitting: Radiation Oncology

## 2015-07-17 ENCOUNTER — Ambulatory Visit
Admission: RE | Admit: 2015-07-17 | Discharge: 2015-07-17 | Disposition: A | Discharge status: Home / Self Care | Payer: Medicare Other | Source: Ambulatory Visit | Attending: Radiation Oncology | Admitting: Radiation Oncology

## 2015-07-17 VITALS — BP 135/54 | HR 68 | Resp 16 | Wt 204.2 lb

## 2015-07-17 DIAGNOSIS — D320 Benign neoplasm of cerebral meninges: Secondary | ICD-10-CM

## 2015-07-17 DIAGNOSIS — Z510 Encounter for antineoplastic radiation therapy: Secondary | ICD-10-CM | POA: Diagnosis not present

## 2015-07-17 NOTE — Progress Notes (Signed)
Weight and vitals stable. Reports chronic middle back pain continues. Denies any seizure activity this week. Reports left arm continues to feel abnormal and left foot drags. Denies headaches, dizziness, nausea or vomiting. Denies floaters or diplopia. Reports fatigue and weakness. Hyperpigmentation with dry desquamation of scalp noted. Continues to use Biafine as directed. Patient is not taken or been prescribed any decadron.   BP 135/54 mmHg  Pulse 68  Resp 16  Wt 204 lb 3.2 oz (92.625 kg)  SpO2 100% Wt Readings from Last 3 Encounters:  07/17/15 204 lb 3.2 oz (92.625 kg)  07/13/15 203 lb 1.6 oz (92.126 kg)  07/10/15 204 lb 11.2 oz (92.851 kg)

## 2015-07-17 NOTE — Progress Notes (Signed)
  Radiation Oncology         [phone]   Name: [name] MRN: [id number]   Date: 07/17/2015  DOB: 1930/10/16   Weekly Radiation Therapy Management    ICD-9-CM ICD-10-CM   1. Meningioma, recurrent of brain ([place]) 225.2 D32.0     Current Dose: 45 Gy  Planned Dose:  54 Gy  Narrative The patient presents for routine under treatment assessment.   Weight and vitals stable. Reports chronic middle back pain continues. Denies any seizure activity this week. Reports left arm continues to feel abnormal and left foot drags. Denies headaches, dizziness, nausea or vomiting. Denies floaters or diplopia. Reports fatigue and weakness. Hyperpigmentation with dry desquamation of scalp noted. Continues to use Biafine as directed. Patient is not taken or been prescribed any decadron.   Set-up films were reviewed. The chart was checked.  Physical Findings  weight is 204 lb 3.2 oz (92.625 kg). His blood pressure is 135/54 and his pulse is 68. His respiration is 16 and oxygen saturation is 100%.  Weight essentially stable. There is no significant changes to the status of overall health to be noted at this time. The patient is ambulatory with a cane. Erythema and exacerbation of solar keratoses noted on his scalp.   Impression The patient is tolerating radiation relatively well.   Plan The patient has been advised to continue treatment as planned. Healthy methods of management in regards to reported symptoms were reviewed in detail. He is aware of his radiation oncology follow-up appointment to take place next week, as scheduled.   All vocalized questions and concerns have been addressed. If the patient develops any further questions or concerns in regards to his treatment and recovery, he has been encouraged to contact me.     ___________________________________________  Sheral Apley. Tammi Klippel, M.D.  This document serves as a record of services personally performed by Tyler Pita, MD. It was created on  his behalf by Lendon Collar, a trained medical scribe. The creation of this record is based on the scribe's personal observations and the provider's statements to them. This document has been checked and approved by the attending provider.

## 2015-07-20 ENCOUNTER — Ambulatory Visit
Admission: RE | Admit: 2015-07-20 | Discharge: 2015-07-20 | Disposition: A | Discharge status: Home / Self Care | Payer: Medicare Other | Source: Ambulatory Visit | Attending: Radiation Oncology | Admitting: Radiation Oncology

## 2015-07-20 DIAGNOSIS — Z510 Encounter for antineoplastic radiation therapy: Secondary | ICD-10-CM | POA: Diagnosis not present

## 2015-07-21 ENCOUNTER — Other Ambulatory Visit (HOSPITAL_COMMUNITY): Payer: Medicare Other

## 2015-07-21 ENCOUNTER — Ambulatory Visit
Admission: RE | Admit: 2015-07-21 | Discharge: 2015-07-21 | Disposition: A | Discharge status: Home / Self Care | Payer: Medicare Other | Source: Ambulatory Visit | Attending: Radiation Oncology | Admitting: Radiation Oncology

## 2015-07-21 DIAGNOSIS — Z510 Encounter for antineoplastic radiation therapy: Secondary | ICD-10-CM | POA: Diagnosis not present

## 2015-07-22 ENCOUNTER — Ambulatory Visit: Payer: Medicare Other

## 2015-07-22 ENCOUNTER — Encounter: Payer: Self-pay | Admitting: Neurology

## 2015-07-22 ENCOUNTER — Ambulatory Visit
Admission: RE | Admit: 2015-07-22 | Discharge: 2015-07-22 | Disposition: A | Discharge status: Home / Self Care | Payer: Medicare Other | Source: Ambulatory Visit | Attending: Radiation Oncology | Admitting: Radiation Oncology

## 2015-07-22 ENCOUNTER — Ambulatory Visit (INDEPENDENT_AMBULATORY_CARE_PROVIDER_SITE_OTHER): Payer: Medicare Other | Admitting: Neurology

## 2015-07-22 VITALS — BP 112/66 | HR 80 | Ht 66.0 in | Wt 202.0 lb

## 2015-07-22 DIAGNOSIS — G40109 Localization-related (focal) (partial) symptomatic epilepsy and epileptic syndromes with simple partial seizures, not intractable, without status epilepticus: Secondary | ICD-10-CM

## 2015-07-22 DIAGNOSIS — D420 Neoplasm of uncertain behavior of cerebral meninges: Secondary | ICD-10-CM | POA: Diagnosis not present

## 2015-07-22 DIAGNOSIS — Z510 Encounter for antineoplastic radiation therapy: Secondary | ICD-10-CM | POA: Diagnosis not present

## 2015-07-22 MED ORDER — LEVETIRACETAM 500 MG PO TABS: ORAL_TABLET | ORAL | Status: DC

## 2015-07-22 NOTE — Progress Notes (Signed)
NEUROLOGY CONSULTATION NOTE  [name] MRN: [id number] DOB: 26-Mar-1931  Referring provider: Dr. Tyler [name] Primary care provider: Dr. Lona [name]  Reason for consult:  seizure  Dear Dr [name]:  Thank you for your kind referral of [name] for consultation of the above symptoms. Although his history is well known to you, please allow me to reiterate it for the purpose of our medical record. The patient was accompanied to the clinic by his wife who also provides collateral information. Records and images were personally reviewed where available.  HISTORY OF PRESENT ILLNESS: This is a very pleasant 81 year old right-handed man with a history of recurrent grade II meningioma of the right frontoparietal convexity s/p resection followed by salvage fractionated stereotactic radiotherapy, followed by salvage resection with new recurrence. 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 He reports having 3 seizures, most recently last 07/11/15. On review of previous records, he started having left-sided sensory changes and pain in September 2014, followed by progressive weakness. He was diagnosed with the meningioma in December 2014 and underwent resection last January 2015. Post-op MRI showed complete resection. His 65-month MRI in July 2015 showed recurrent disease and he underwent radiotherapy. In October 2015, he had stopped Decadron without taper and reported 2 episodes of shaking of the left arm that took over his entire body. Decadron was restarted. He then started to have worsening left-sided weakness, with MRI showing fairly significant recurrence, he underwent another resection last May 2016. He was started on Keppra after the surgery, then reports tapering this off. On his 06/26/15, he started reporting episodes of intermittent numbness and loss of control of the left arm, then on 07/11/15, after working in the yard, he was relaxing for 30-40 minutes when  his left hand started shaking for 3-5 minutes, followed by flaccidity of the arm. Then his left arm "shot up," he grabbed it but could not pull it down. This was followed by tremendous pain in his arm, "like it was in a vise," he stood up and his left leg felt heavy. He could not get up their porch steps and lay on the steps for a few minutes, then managed to get to the door. Symptoms started easing off after 30 minutes. His wife noticed his speech was slurred. He was weak the next day, and started feeling back to baseline 2 days later. He saw Dr. Tammi [name] and was started on Keppra 500mg  BID. He reports the episodes of intermittent numbness in his left arm have decreased, still occurring on a daily basis, feeling like his arm was in a sleeve for a few minutes. If he tries to use his hand, it would become tremulous. His left leg still feels heavy, but he reports these symptoms "since day 1 in 2014." He denies any recent falls. He occasionally notices that some words don't come out like he wants them to. He denies any headaches, dizziness, diplopia, dysphagia, neck pain, bowel/bladder dysfunction. He has some back pain. He denies any olfactory/gustatory hallucinations, deja vu, rising epigastric sensation.   I personally reviewed MRI brain done 05/15/15 which noted overall progression of meningioma compared to 02/2015 study. There is dural thickening with nodularity which is likely due to tumor along the dura. In addition, several nodular implants have grown significantly including a 9x69mm enhancing mass along the anterior margin of the craniotomy. The 27mm nodule along the posterior aspect of the craniotomy has also progressed. There is tumor filling the superior  sagittal sinus as noted previously and this also appears to have progressed. The superior sagittal sinus appears occluded by tumor. There is also some tumor extending through the craniotomy site over the convexity.  Epilepsy Risk Factors:  Atypical right  frontoparietal meningioma s/p craniotomy x 2. Otherwise he had a normal birth and early development.  There is no history of febrile convulsions, CNS infections such as meningitis/encephalitis, significant traumatic brain injury, or family history of seizures.  Laboratory Data:  Lab Results  Component Value Date   WBC 7.9 07/10/2015   HGB 14.4 07/10/2015   HCT 43.9 07/10/2015   MCV 96.3 07/10/2015   PLT 283 07/10/2015     Chemistry      Component Value Date/Time   NA 135 02/10/2015 1300   NA 139 11/25/2014 1414   K 3.6 02/10/2015 1300   K 3.8 11/25/2014 1414   CL 103 02/10/2015 1300   CO2 23 02/10/2015 1300   CO2 23 11/25/2014 1414   BUN 22.7 05/14/2015 0959   BUN 14 02/10/2015 1300   CREATININE 1.1 05/14/2015 0959   CREATININE 1.02 02/17/2015 2039      Component Value Date/Time   CALCIUM 9.3 02/10/2015 1300   CALCIUM 9.2 11/25/2014 1414   ALKPHOS 56 11/25/2014 1414   ALKPHOS 56 10/04/2013 0601   AST 16 11/25/2014 1414   AST 20 10/04/2013 0601   ALT 10 11/25/2014 1414   ALT 59* 10/04/2013 0601   BILITOT 0.24 11/25/2014 1414   BILITOT 0.3 10/04/2013 0601      PAST MEDICAL HISTORY: Past Medical History  Diagnosis Date  . Anemia   . Prostate cancer ([place])   . Basal cell cancer   . Hyperlipidemia   . Restless leg   . GERD (gastroesophageal reflux disease)   . Brain tumor ([place])   . History of radiation therapy     back approximately 2010 prostate radiation under care of Dr. Valere [name]   . Hypertension   . Herniated disc     lumbar spine  . OSA (obstructive sleep apnea) 10/28/2014    Moderate with AHI 21/hr  . Meningioma ([place])     brain  . Hiatal hernia   . Occult blood in stools   . Nocturia     PAST SURGICAL HISTORY: Past Surgical History  Procedure Laterality Date  . Joint replacement      right knee replacement  . Bilateral cateract surgery    . Vasectomy    . Colonoscopy      colon polyp removed  . Craniotomy Right 09/27/2013    Procedure: RIGHT FRONTAL  CRANIOTOMY FOR TUMOR RESECTION ;  Surgeon: Paul Lose, MD;  Location: [facility];  Service: Neurosurgery;  Laterality: Right;  . Prostate biopsy    . Tonsillectomy      Agae 6-7  . Craniotomy Right 02/17/2015    Procedure: Right frontal parietal craniotomy for resection of meningioma with brainlab;  Surgeon: Paul Lose, MD;  Location: [facility];  Service: Neurosurgery;  Laterality: Right;  Right frontal parietal craniotomy for resection of meningioma with brainlab  . Application of cranial navigation N/A 02/17/2015    Procedure: APPLICATION OF CRANIAL NAVIGATION;  Surgeon: Paul Lose, MD;  Location: [facility];  Service: Neurosurgery;  Laterality: N/A;  APPLICATION OF CRANIAL NAVIGATION    MEDICATIONS: Current Outpatient Prescriptions on File Prior to Visit  Medication Sig Dispense Refill  . acetaminophen (TYLENOL) 500 MG tablet Take 1,000 mg by mouth every 8 (eight) hours as  needed (pain).    Marland Kitchen b complex vitamins tablet Take 1 tablet by mouth daily.    Marland Kitchen emollient (BIAFINE) cream Apply topically as needed.    . famotidine (PEPCID) 20 MG tablet Take 20 mg by mouth at bedtime.    . ferrous sulfate 325 (65 FE) MG tablet Take 650 mg by mouth 1 day or 1 dose.     . levETIRAcetam (KEPPRA) 500 MG tablet Take 1 tablet (500 mg total) by mouth 2 (two) times daily. 60 tablet 6  . magnesium gluconate (MAGONATE) 500 MG tablet Take 500 mg by mouth daily.    Marland Kitchen omeprazole (PRILOSEC) 20 MG capsule Take 1 capsule (20 mg total) by mouth daily. 30 capsule 1  . Probiotic Product (PROBIOTIC DAILY PO) Take 1 capsule by mouth daily.     Marland Kitchen rOPINIRole (REQUIP) 3 MG tablet Take 3 mg by mouth at bedtime. Pt takes 1 hour before bedtime  3  . simvastatin (ZOCOR) 40 MG tablet Take 1 tablet (40 mg total) by mouth every evening. 30 tablet 1  . solifenacin (VESICARE) 5 MG tablet Take 1 tablet (5 mg total) by mouth daily. 30 tablet 1  . tamsulosin (FLOMAX) 0.4 MG CAPS capsule Take 1 capsule (0.4 mg  total) by mouth daily after supper. (Patient taking differently: Take 0.4 mg by mouth daily after breakfast. ) 30 capsule 1  . vitamin B-12 (CYANOCOBALAMIN) 100 MCG tablet Take 100 mcg by mouth daily.      No current facility-administered medications on file prior to visit.    ALLERGIES: Allergies  Allergen Reactions  . Asa [Aspirin] Nausea Only  . Vicodin [Hydrocodone-Acetaminophen] Nausea Only    FAMILY HISTORY: Family History  Problem Relation Age of Onset  . Parkinsonism Mother   . Dementia Father     SOCIAL HISTORY: Social History   Social History  . Marital Status: Married    Spouse Name: Mardene Celeste  . Number of Children: 2  . Years of Education: 12   Occupational History  . Retired, Furniture conservator/restorer    Social History Main Topics  . Smoking status: Never Smoker   . Smokeless tobacco: Former Systems developer    Types: [place] date: 10/08/1973  . Alcohol Use: No  . Drug Use: No  . Sexual Activity: Not Currently   Other Topics Concern  . Not on file   Social History Narrative   Patient is married([name]) lives with wife in [place].  Normally independent of ADLs.   Patient is retired.   Patient has two children.   Patient has a high school education.   Right handed          REVIEW OF SYSTEMS: Constitutional: No fevers, chills, or sweats, no generalized fatigue, change in appetite Eyes: No visual changes, double vision, eye pain Ear, nose and throat: No hearing loss, ear pain, nasal congestion, sore throat Cardiovascular: No chest pain, palpitations Respiratory:  No shortness of breath at rest or with exertion, wheezes GastrointestinaI: No nausea, vomiting, diarrhea, abdominal pain, fecal incontinence Genitourinary:  No dysuria, urinary retention or frequency Musculoskeletal:  No neck pain,+occl back pain Integumentary: No rash, pruritus, skin lesions Neurological: as above Psychiatric: No depression, insomnia, anxiety Endocrine: No palpitations, fatigue,  diaphoresis, mood swings, change in appetite, change in weight, increased thirst Hematologic/Lymphatic:  No anemia, purpura, petechiae. Allergic/Immunologic: no itchy/runny eyes, nasal congestion, recent allergic reactions, rashes  PHYSICAL EXAM: Filed Vitals:   07/22/15 1235  BP: 112/66  Pulse: 80   General: No  acute distress Head:  Normocephalic/atraumatic Eyes: Fundoscopic exam shows bilateral sharp discs, no vessel changes, exudates, or hemorrhages Neck: supple, no paraspinal tenderness, full range of motion Back: No paraspinal tenderness Heart: regular rate and rhythm Lungs: Clear to auscultation bilaterally. Vascular: No carotid bruits. Skin/Extremities: No rash, no edema Neurological Exam: Mental status: alert and oriented to person, place, and time, no dysarthria or aphasia, Fund of knowledge is appropriate.  Recent and remote memory are intact.  Attention and concentration are normal.    Able to name objects and repeat phrases. Cranial nerves: CN I: not tested CN II: pupils equal, round and reactive to light, visual fields intact, fundi unremarkable. CN III, IV, VI:  full range of motion, no nystagmus, no ptosis CN V: decreased pin on left V1 distribution CN VII: upper and lower face symmetric CN VIII: hearing intact to finger rub CN IX, X: gag intact, uvula midline CN XI: sternocleidomastoid and trapezius muscles intact CN XII: tongue midline Bulk & Tone: normal, no fasciculations. Motor: 4/5 left UE with decreased fine finger movements on left hand, 4+/5 left LE Sensation: decreased pin and cold on left calf, otherwise intact to all modalities on both UE, intact vibration and joint position sense. Romberg test negative Deep Tendon Reflexes: brisk +2 throughout, no ankle clonus Plantar responses: downgoing bilaterally Cerebellar: no incoordination on finger to nose, heel to shin. No dysdiadochokinesia Gait: spastic hemiparetic gait with left arm flexed at the wrist and  dragging of left leg Tremor: none today  IMPRESSION: This is a pleasant 81 year old right-handed man with a history of atypical meningioma s/p resection x 2 and currently undergoing another round of stereotactic radiation treatment. He presents for evaluation after an episode suggestive of simple partial motor seizure last 07/11/15. He also reports recurrent episodes of left arm numbness, which may represent simple partial sensory seizures. Routine EEG will be ordered. He will increase Keppra to 750mg  BID and continue to monitor symptoms, side effects were discussed. His last MRI in August 2016 had shown progression, he will be finishing radiation in 2 days, and may be having a repeat MRI brain soon as per Dr. Allie Bossier. [place] driving laws were discussed with the patient, and he knows to stop driving after a seizure, until 6 months seizure-free. He will follow-up in 3 months and knows to call our office for any changes.   Thank you for allowing me to participate in the care of this patient. Please do not hesitate to call for any questions or concerns.   Ellouise Newer, M.D.  CC: Dr. Tammi [name], Dr. Kathyrn Sheriff, Dr. Harrington Challenger

## 2015-07-22 NOTE — Patient Instructions (Signed)
1. Increase Keppra 500mg : Take 1 & 1/2 tablets twice a day 2. Schedule routine EEG 3. As per [place] driving laws, after a seizure with loss of consciousness/awareness, one should not drive until 6 months seizure-free. Would hold off on driving for now until symptoms have stabilized 4. Follow-up in 3 months, call for any problems  Seizure Precautions: 1. If medication has been prescribed for you to prevent seizures, take it exactly as directed.  Do not stop taking the medicine without talking to your doctor first, even if you have not had a seizure in a long time.   2. Avoid activities in which a seizure would cause danger to yourself or to others.  Don't operate dangerous machinery, swim alone, or climb in high or dangerous places, such as on ladders, roofs, or girders.  Do not drive unless your doctor says you may.  3. If you have any warning that you may have a seizure, lay down in a safe place where you can't hurt yourself.    4.  No driving for 6 months from last seizure, as per [facility].   Please refer to the following link on the [place] website for more information: http://www.epilepsyfoundation.org/answerplace/Social/driving/drivingu.cfm   5.  Maintain good sleep hygiene. Avoid alcohol  6.  Contact your doctor if you have any problems that may be related to the medicine you are taking.  7.  Call 911 and bring the patient back to the ED if:        A.  The seizure lasts longer than 5 minutes.       B.  The patient doesn't awaken shortly after the seizure  C.  The patient has new problems such as difficulty seeing, speaking or moving  D.  The patient was injured during the seizure  E.  The patient has a temperature over 102 F (39C)  F.  The patient vomited and now is having trouble breathing

## 2015-07-23 ENCOUNTER — Ambulatory Visit
Admission: RE | Admit: 2015-07-23 | Discharge: 2015-07-23 | Disposition: A | Discharge status: Home / Self Care | Payer: Medicare Other | Source: Ambulatory Visit | Attending: Radiation Oncology | Admitting: Radiation Oncology

## 2015-07-23 DIAGNOSIS — Z510 Encounter for antineoplastic radiation therapy: Secondary | ICD-10-CM | POA: Diagnosis not present

## 2015-07-23 DIAGNOSIS — G40109 Localization-related (focal) (partial) symptomatic epilepsy and epileptic syndromes with simple partial seizures, not intractable, without status epilepticus: Secondary | ICD-10-CM | POA: Insufficient documentation

## 2015-07-24 ENCOUNTER — Ambulatory Visit
Admission: RE | Admit: 2015-07-24 | Discharge: 2015-07-24 | Disposition: A | Discharge status: Home / Self Care | Payer: Medicare Other | Source: Ambulatory Visit | Attending: Radiation Oncology | Admitting: Radiation Oncology

## 2015-07-24 ENCOUNTER — Encounter: Payer: Self-pay | Admitting: Radiation Oncology

## 2015-07-24 VITALS — BP 126/64 | HR 69 | Temp 98.1°F | Resp 16 | Ht 66.0 in | Wt 203.6 lb

## 2015-07-24 DIAGNOSIS — D320 Benign neoplasm of cerebral meninges: Secondary | ICD-10-CM | POA: Insufficient documentation

## 2015-07-24 DIAGNOSIS — Z510 Encounter for antineoplastic radiation therapy: Secondary | ICD-10-CM | POA: Diagnosis not present

## 2015-07-24 MED ORDER — SONAFINE EX EMUL
1.0000 "application " | Freq: Two times a day (BID) | CUTANEOUS | Status: DC
  Administered 2015-07-24: 1 via TOPICAL
  Filled 2015-07-24: qty 45

## 2015-07-24 NOTE — Progress Notes (Signed)
  Radiation Oncology         [phone] ________________________________  Name: [name] MRN: [id number]  Date: 07/24/2015  DOB: 08/29/1931    Weekly Radiation Therapy Management    ICD-9-CM ICD-10-CM   1. Meningioma, recurrent of brain (HCC) 225.2 D32.0 SONAFINE emulsion 1 application    Current Dose: 54 Gy     Planned Dose:  54 Gy  Narrative . . . . . . . . The patient presents for the final under treatment assessment.                                           The patient has had some continuation of previously noted symptoms. Pamela Maddy has completed treatment with 30 fractions. He denies pain today. He reports feeling fatigued and weak since his last seizure two weeks ago. He continues to reports more weakness in his left leg and left hand. He uses a cane for occasional dizziness. He reports Dr. Delice Lesch has increased his Keppra to 1.5 500 mg tablets BID. He denies having headaches, nausea and vision changes. He is not taking Decadron. The skin on his forehead/scalp is red and peeling. He is using biafine and has been given a refill of Sonafine. He has been given a one month follow up appointment.                                 Set-up films were reviewed.                                 The chart was checked. Physical Findings. . . Weight essentially stable.  His scalp shows mild erythema with clearing of solar keratosis.  Impression . . . . . . . The patient tolerated radiation relatively well. Plan . . . . . . . . . . . . Complete radiation today as scheduled, and follow-up in one month. The patient was encouraged to call or return to the clinic in the interim for any worsening symptoms. We will repeat imaging in 3 months.  ________________________________  Sheral Apley. Tammi Klippel, M.D.  This document serves as a record of services personally performed by Tyler Pita, MD. It was created on his behalf by Arlyce Harman, a trained medical scribe. The creation of this record is  based on the scribe's personal observations and the provider's statements to them. This document has been checked and approved by the attending provider.

## 2015-07-24 NOTE — Progress Notes (Addendum)
[name] has completed treatment with 30 fractions.  He denies pain today.  He reports feeling fatigued and weak since his last seizure two weeks ago.  He continues to reports more weakness in his left leg.  He uses a cane for occasional dizziness.  He reports Dr. Delice Lesch has increased his Keppra to 1.5 500 mg tablets BID.  He denies having headaches, nausea and vision changes.  He is not taking Decadron.  The skin on his forehead/scalp is red and peeling.  He is using biafine and has been given a refill of Sonafine..  He has been given a one month follow up appointment.  BP 126/64 mmHg  Pulse 69  Temp(Src) 98.1 F (36.7 C) (Oral)  Resp 16  Ht 5\' 6"  (1.676 m)  Wt 203 lb 9.6 oz (92.352 kg)  BMI 32.88 kg/m2  SpO2 100%

## 2015-07-29 ENCOUNTER — Ambulatory Visit (HOSPITAL_COMMUNITY): Payer: Medicare Other | Attending: Cardiology

## 2015-07-29 ENCOUNTER — Other Ambulatory Visit: Payer: Self-pay

## 2015-07-29 DIAGNOSIS — I10 Essential (primary) hypertension: Secondary | ICD-10-CM | POA: Insufficient documentation

## 2015-07-29 DIAGNOSIS — G4733 Obstructive sleep apnea (adult) (pediatric): Secondary | ICD-10-CM

## 2015-07-29 DIAGNOSIS — D420 Neoplasm of uncertain behavior of cerebral meninges: Secondary | ICD-10-CM | POA: Diagnosis not present

## 2015-07-29 DIAGNOSIS — E785 Hyperlipidemia, unspecified: Secondary | ICD-10-CM | POA: Insufficient documentation

## 2015-07-29 DIAGNOSIS — I351 Nonrheumatic aortic (valve) insufficiency: Secondary | ICD-10-CM | POA: Insufficient documentation

## 2015-08-06 ENCOUNTER — Ambulatory Visit (INDEPENDENT_AMBULATORY_CARE_PROVIDER_SITE_OTHER): Payer: Medicare Other | Admitting: Neurology

## 2015-08-06 DIAGNOSIS — G40109 Localization-related (focal) (partial) symptomatic epilepsy and epileptic syndromes with simple partial seizures, not intractable, without status epilepticus: Secondary | ICD-10-CM | POA: Diagnosis not present

## 2015-08-07 NOTE — Procedures (Signed)
ELECTROENCEPHALOGRAM REPORT  Date of Study: 08/06/2015  Patient's Name: [name] MRN: [id number] Date of Birth: 1931/01/08  Referring Provider: Dr. Ellouise Newer  Clinical History: This is an 81 year old man with a history of recurrent meningioma with stereotyped episodes on the left hand and arm, reporting recurrent numbness.   Medications: Keppra, Vitamin B-12, Tylenol, B-complex vitamins, Biafene, Pepcid, 65Fe, Magonate, Prilosec, Requip, Zocor, Vesicare, Flomax  Technical Summary: A multichannel digital EEG recording measured by the international 10-20 system with electrodes applied with paste and impedances below 5000 ohms performed in our laboratory with EKG monitoring in an awake and drowsy patient.  Hyperventilation and photic stimulation were not performed.  The digital EEG was referentially recorded, reformatted, and digitally filtered in a variety of bipolar and referential montages for optimal display.    Description: The patient is awake and drowsy during the recording.  During maximal wakefulness, there is a symmetric, medium voltage 9 Hz posterior dominant rhythm that attenuates with eye opening.  The record is symmetric.  There is P4 artifact throughout the recording. During drowsiness and sleep, there is an increase in theta slowing of the background.Deeper stages of sleep were not seen. Hyperventilation and photic stimulation were not performed.  There were no epileptiform discharges or electrographic seizures seen.    EKG lead was unremarkable.  Impression: This awake and drowsy EEG is normal.    Clinical Correlation: A normal EEG does not exclude a clinical diagnosis of epilepsy.  If further clinical questions remain, prolonged EEG may be helpful.  Clinical correlation is advised.   Ellouise Newer, M.D.

## 2015-08-11 ENCOUNTER — Ambulatory Visit (INDEPENDENT_AMBULATORY_CARE_PROVIDER_SITE_OTHER): Payer: Medicare Other | Admitting: Cardiology

## 2015-08-11 ENCOUNTER — Encounter: Payer: Self-pay | Admitting: Cardiology

## 2015-08-11 ENCOUNTER — Telehealth: Payer: Self-pay | Admitting: Family Medicine

## 2015-08-11 VITALS — BP 140/72 | HR 80 | Ht 66.0 in | Wt 200.8 lb

## 2015-08-11 DIAGNOSIS — I351 Nonrheumatic aortic (valve) insufficiency: Secondary | ICD-10-CM

## 2015-08-11 DIAGNOSIS — E669 Obesity, unspecified: Secondary | ICD-10-CM | POA: Diagnosis not present

## 2015-08-11 DIAGNOSIS — G4733 Obstructive sleep apnea (adult) (pediatric): Secondary | ICD-10-CM

## 2015-08-11 DIAGNOSIS — I10 Essential (primary) hypertension: Secondary | ICD-10-CM | POA: Diagnosis not present

## 2015-08-11 HISTORY — DX: Nonrheumatic aortic (valve) insufficiency: I35.1

## 2015-08-11 NOTE — Patient Instructions (Signed)

## 2015-08-11 NOTE — Progress Notes (Signed)
Cardiology Office Note   Date:  08/11/2015   ID:  [name], DOB 01-29-1931, MRN [id number]  PCP:   Melinda Crutch, MD    Chief Complaint  Patient presents with  . Hypertension    no refills  . Sleep Apnea      History of Present Illness: This is an 81yo male with a history of GERD, HTN and prostate CA who presents for followup of fatigue and SOB. A nuclear stress test showed no ischemia and echo showed normal LVF with moderate AI. He had a sleep study showing moderate OSA with AHI 21/hr and underwent CPAP titration to 12cm H2O. He is doing well with his CPAP device but is still having  trouble with a hissing noise with his machine and the mask leaks.He saw [place] and was told he needs to see me.   He tolerates the full face mask but his old mask was leaking and his DME got him a new one which he says still leaks. He feels the pressure is adequate. He feels rested in the am but still gets sleepy some during the afternoon. He goes to bed at 10pm and gets up frequently at night and then gets up around 4-5am. He has gotten up at 4am for years due to his work in the past.   Past Medical History  Diagnosis Date  . Anemia   . Prostate cancer ([place])   . Basal cell cancer   . Hyperlipidemia   . Restless leg   . GERD (gastroesophageal reflux disease)   . Brain tumor ([place])   . History of radiation therapy     back approximately 2010 prostate radiation under care of Dr. Valere Dross   . Hypertension   . Herniated disc     lumbar spine  . OSA (obstructive sleep apnea) 10/28/2014    Moderate with AHI 21/hr  . Meningioma ([place])     brain  . Hiatal hernia   . Occult blood in stools   . Nocturia   . Aortic insufficiency 08/11/2015    Past Surgical History  Procedure Laterality Date  . Joint replacement      right knee replacement  . Bilateral cateract surgery    . Vasectomy    . Colonoscopy      colon polyp removed  . Craniotomy Right 09/27/2013   Procedure: RIGHT FRONTAL CRANIOTOMY FOR TUMOR RESECTION ;  Surgeon: Consuella Lose, MD;  Location: [facility];  Service: Neurosurgery;  Laterality: Right;  . Prostate biopsy    . Tonsillectomy      Agae 6-7  . Craniotomy Right 02/17/2015    Procedure: Right frontal parietal craniotomy for resection of meningioma with brainlab;  Surgeon: Consuella Lose, MD;  Location: [facility];  Service: Neurosurgery;  Laterality: Right;  Right frontal parietal craniotomy for resection of meningioma with brainlab  . Application of cranial navigation N/A 02/17/2015    Procedure: APPLICATION OF CRANIAL NAVIGATION;  Surgeon: Consuella Lose, MD;  Location: [facility];  Service: Neurosurgery;  Laterality: N/A;  APPLICATION OF CRANIAL NAVIGATION     Current Outpatient Prescriptions  Medication Sig Dispense Refill  . acetaminophen (TYLENOL) 500 MG tablet Take 1,000 mg by mouth every 8 (eight) hours as needed (pain).    Marland Kitchen b complex vitamins tablet Take 1 tablet by mouth daily.    Marland Kitchen emollient (BIAFINE) cream Apply 1 application topically  daily as needed (radiation treatment).     . famotidine (PEPCID) 20 MG tablet Take 20 mg by mouth at bedtime.    . ferrous sulfate 325 (65 FE) MG tablet Take 650 mg by mouth 1 day or 1 dose.     . levETIRAcetam (KEPPRA) 500 MG tablet Take 1 & 1/2 tablets twice a day 90 tablet 6  . magnesium gluconate (MAGONATE) 500 MG tablet Take 500 mg by mouth daily.    Marland Kitchen omeprazole (PRILOSEC) 20 MG capsule Take 1 capsule (20 mg total) by mouth daily. 30 capsule 1  . Probiotic Product (PROBIOTIC DAILY PO) Take 1 capsule by mouth daily.     Marland Kitchen rOPINIRole (REQUIP) 3 MG tablet Take 3 mg by mouth at bedtime. Pt takes 1 hour before bedtime  3  . simvastatin (ZOCOR) 40 MG tablet Take 1 tablet (40 mg total) by mouth every evening. 30 tablet 1  . solifenacin (VESICARE) 5 MG tablet Take 1 tablet (5 mg total) by mouth daily. 30 tablet 1  . tamsulosin (FLOMAX) 0.4 MG CAPS capsule Take 1 capsule  (0.4 mg total) by mouth daily after supper. 30 capsule 1  . valsartan (DIOVAN) 160 MG tablet Take 160 mg by mouth daily.    . vitamin B-12 (CYANOCOBALAMIN) 100 MCG tablet Take 100 mcg by mouth daily.      No current facility-administered medications for this visit.    Allergies:   Asa and Vicodin    Social History:  The patient  reports that he has never smoked. He quit smokeless tobacco use about 41 years ago. His smokeless tobacco use included Chew. He reports that he does not drink alcohol or use illicit drugs.   Family History:  The patient's family history includes Dementia in his father; Parkinsonism in his mother.    ROS:  Please see the history of present illness.   Otherwise, review of systems are positive for none.   All other systems are reviewed and negative.    PHYSICAL EXAM: VS:  BP 140/72 mmHg  Pulse 80  Ht 5\' 6"  (1.676 m)  Wt 91.082 kg (200 lb 12.8 oz)  BMI 32.43 kg/m2 , BMI Body mass index is 32.43 kg/(m^2). GEN: Well nourished, well developed, in no acute distress HEENT: normal Neck: no JVD, carotid bruits, or masses Cardiac: RRR; no murmurs, rubs, or gallops,no edema  Respiratory:  clear to auscultation bilaterally, normal work of breathing GI: soft, nontender, nondistended, + BS MS: no deformity or atrophy Skin: warm and dry, no rash Neuro:  Strength and sensation are intact Psych: euthymic mood, full affect   EKG:  EKG was ordered today and showed NSR with rSR' in V1    Recent Labs: 11/25/2014: ALT 10 02/10/2015: Potassium 3.6; Sodium 135 05/14/2015: BUN 22.7; Creatinine 1.1 07/10/2015: HGB 14.4; Platelets 283    Lipid Panel No results found for: CHOL, TRIG, HDL, CHOLHDL, VLDL, LDLCALC, LDLDIRECT    Wt Readings from Last 3 Encounters:  08/11/15 91.082 kg (200 lb 12.8 oz)  07/24/15 92.352 kg (203 lb 9.6 oz)  07/22/15 91.627 kg (202 lb)      ASSESSMENT AND PLAN:  1. SOB of unclear etiology. This has resolved. He has no CP. Nuclear stress  test was normal 2. Moderate OSA with an AHI of 21/hr  now on CPAP at 12cm H2O. His d/l today showed an AHI of 6.4/hr on 12cm H2o and 97% compliance in using more than 4 hours nightly. 3. HTN well controlled - continue Valsartan  4. Chronic LE edema - he has significant LE edema on exam.  The last time I saw him he was on Valsartan HCT but he thinks that somehow the last refill got called in wrong and no longer on the HCT.  I will contact his PCP to make sure there is not a reason to be off diuretics before restarting. 5. Moderate AI on echo - repeat 07/2015 with trivial AI   Current medicines are reviewed at length with the patient today.  The patient does not have concerns regarding medicines.  The following changes have been made:  no change  Labs/ tests ordered today: See above Assessment and Plan No orders of the defined types were placed in this encounter.     Disposition:   FU with me in 6 months  Signed, Sueanne Margarita, MD  08/11/2015 2:43 PM    [facility] [address], [place], [place]  [address] Phone: [phone]; Fax: [phone]

## 2015-08-11 NOTE — Telephone Encounter (Signed)
-----   Message from Cameron Sprang, MD sent at 08/11/2015  8:38 AM EST ----- Pls let him know EEG is normal, continue on higher dose Keppra. Thanks

## 2015-08-11 NOTE — Telephone Encounter (Signed)
Left msg on vm to rtn my call.

## 2015-08-11 NOTE — Telephone Encounter (Signed)
Patient returned my call. Notified of result and advisement.

## 2015-08-15 NOTE — Progress Notes (Signed)
  Radiation Oncology         (336) [phone] ________________________________  Name: [name] MRN: [id number]  Date: 07/24/2015  DOB: 04/22/1931  End of Treatment Note   ICD-9-CM ICD-10-CM    1. Meningioma, recurrent of brain 225.2 D32.0     DIAGNOSIS: The patient is an 82 year old gentleman presenting to clinic in regards to his recurrent grade II meningioma of the right frontoparietal convexity s/p resection followed by salvage fractionated stereotactic radiotherapy to 25 Gy in 5 fractions of 5 Gy through 05/29/2014 followed by salvage resection with new recurrence.     Indication for treatment:  Curative       Radiation treatment dates:   06/15/2015-07/24/2015  Site/dose:   To the right frontal tumor 54 Gy in 30 fractions of 1.8 Gy  Beams/energy:   The patient was treated using TomoTherapy with 6 MV photons delivered in helical fashion with daily CT guidance.  Narrative: The patient tolerated radiation treatment relatively well.   He experienced epilation and a dermatitis reaction to solar keratoses.   Plan: The patient has completed radiation treatment. The patient will return to radiation oncology clinic for routine followup in one month. I advised him to call or return sooner if he has any questions or concerns related to his recovery or treatment. ________________________________  Sheral Apley. Tammi Klippel, M.D.  This document serves as a record of services personally performed by Tyler Pita, MD. It was created on his behalf by Arlyce Harman, a trained medical scribe. The creation of this record is based on the scribe's personal observations and the provider's statements to them. This document has been checked and approved by the attending provider.

## 2015-08-25 ENCOUNTER — Encounter: Payer: Self-pay | Admitting: Cardiology

## 2015-08-27 ENCOUNTER — Ambulatory Visit
Admission: RE | Admit: 2015-08-27 | Discharge: 2015-08-27 | Disposition: A | Discharge status: Home / Self Care | Payer: Medicare Other | Source: Ambulatory Visit | Attending: Radiation Oncology | Admitting: Radiation Oncology

## 2015-08-27 ENCOUNTER — Encounter: Payer: Self-pay | Admitting: Radiation Oncology

## 2015-08-27 VITALS — BP 132/70 | HR 77 | Temp 99.0°F | Resp 20 | Ht 66.0 in | Wt 205.9 lb

## 2015-08-27 DIAGNOSIS — D320 Benign neoplasm of cerebral meninges: Secondary | ICD-10-CM

## 2015-08-27 NOTE — Progress Notes (Signed)
Radiation Oncology         (336) [phone] ________________________________  Name: [name] MRN: [id number]  Date: 08/27/2015  DOB: [birth year]  Follow-Up Visit Note  CC:  Melinda Crutch, MD  Consuella Lose, MD  Diagnosis:   The patient is an 81 yo gentleman presenting to the clinic in regards to his recurrent grade II meningioma of the right frontoparietal convexity s/p resection followed by salvage fractionated stereotactic radiotherapy to 25 Gy in 5 fractions of 5 Gr through 05/29/2014 followed by salvage resection with new recurrence. The right front tumor was treated with 54 Gy in 30 fractions of 1.8 Gy from 06/15/2015-07/23/2015.    ICD-9-CM ICD-10-CM   1. Meningioma, recurrent of brain ([facility]) 225.2 D32.0     Interval Since Last Radiation:  1  months  Narrative:  The patient returns today for routine follow-up. He takes keppra twice daily. His left foot still drags and his left hand has tremors after anytime he tries to use his hand. He denies nausea, headaches, and vision changes. He feels fatigued. He had an EEG 08/11/15. He has a yellow fall risk band on left hand and is using a cane. He has slow gait. He states he has been having chills the last couple of days in the evenings. He has a low temperature today. His appetite is good.                              ALLERGIES:  is allergic to asa and vicodin.  Meds: Current Outpatient Prescriptions  Medication Sig Dispense Refill  . acetaminophen (TYLENOL) 500 MG tablet Take 1,000 mg by mouth every 8 (eight) hours as needed (pain).    Marland Kitchen b complex vitamins tablet Take 1 tablet by mouth daily.    . famotidine (PEPCID) 20 MG tablet Take 20 mg by mouth at bedtime.    . ferrous sulfate 325 (65 FE) MG tablet Take 650 mg by mouth 1 day or 1 dose.     . levETIRAcetam (KEPPRA) 500 MG tablet Take 1 & 1/2 tablets twice a day 90 tablet 6  . magnesium gluconate (MAGONATE) 500 MG tablet Take 500 mg by mouth daily.    Marland Kitchen omeprazole (PRILOSEC) 20 MG  capsule Take 1 capsule (20 mg total) by mouth daily. 30 capsule 1  . Probiotic Product (PROBIOTIC DAILY PO) Take 1 capsule by mouth daily.     Marland Kitchen rOPINIRole (REQUIP) 3 MG tablet Take 3 mg by mouth at bedtime. Pt takes 1 hour before bedtime  3  . solifenacin (VESICARE) 5 MG tablet Take 1 tablet (5 mg total) by mouth daily. 30 tablet 1  . tamsulosin (FLOMAX) 0.4 MG CAPS capsule Take 1 capsule (0.4 mg total) by mouth daily after supper. 30 capsule 1  . valsartan (DIOVAN) 160 MG tablet Take 160 mg by mouth daily.    . vitamin B-12 (CYANOCOBALAMIN) 100 MCG tablet Take 100 mcg by mouth daily.     . simvastatin (ZOCOR) 40 MG tablet Take 1 tablet (40 mg total) by mouth every evening. (Patient not taking: Reported on 08/27/2015) 30 tablet 1   No current facility-administered medications for this encounter.    Physical Findings: The patient is in no acute distress. Patient is alert and oriented.  height is 5\' 6"  (1.676 m) and weight is 205 lb 14.4 oz (93.396 kg). His oral temperature is 99 F (37.2 C). His blood pressure is 132/70 and his pulse  is 77. His respiration is 20 and oxygen saturation is 100%. .  No significant changes.  Lab Findings: Lab Results  Component Value Date   WBC 7.9 07/10/2015   WBC 11.4* 02/17/2015   HGB 14.4 07/10/2015   HGB 13.0 02/17/2015   HCT 43.9 07/10/2015   HCT 40.1 02/17/2015   PLT 283 07/10/2015   PLT 206 02/17/2015    Lab Results  Component Value Date   NA 135 02/10/2015   NA 139 11/25/2014   K 3.6 02/10/2015   K 3.8 11/25/2014   CHLORIDE 106 11/25/2014   CO2 23 02/10/2015   CO2 23 11/25/2014   GLUCOSE 113* 02/10/2015   GLUCOSE 115 11/25/2014   BUN 22.7 05/14/2015   BUN 14 02/10/2015   CREATININE 1.1 05/14/2015   CREATININE 1.02 02/17/2015   BILITOT 0.24 11/25/2014   BILITOT 0.3 10/04/2013   ALKPHOS 56 11/25/2014   ALKPHOS 56 10/04/2013   AST 16 11/25/2014   AST 20 10/04/2013   ALT 10 11/25/2014   ALT 59* 10/04/2013   PROT 6.6 11/25/2014    PROT 5.9* 10/04/2013   ALBUMIN 3.4* 11/25/2014   ALBUMIN 2.5* 10/04/2013   CALCIUM 9.3 02/10/2015   CALCIUM 9.2 11/25/2014   ANIONGAP 9 02/10/2015   ANIONGAP 10 11/25/2014    Radiographic Findings: No results found.  Impression:  The patient is recovering from the effects of radiation.  Plan: He will be scheduled for an MRI in February. He has an appointment with Dr. Alen Blew 2/710/16 and Dr. Delice Lesch 10/26/14.   _____________________________________  Sheral Apley. Tammi Klippel, M.D.   This document serves as a record of services personally performed by Tyler Pita, MD. It was created on his behalf by Lendon Collar, a trained medical scribe. The creation of this record is based on the scribe's personal observations and the provider's statements to them. This document has been checked and approved by the attending provider.

## 2015-08-27 NOTE — Progress Notes (Addendum)
Follow up s/p whole brain 06/15/15-07/24/15  Takes keppra  Bid, his left foot still drags, and left hand has tremors after working , and anytime he tries to use his hand , no nausea, no head aches, no vision changes, is fatigued,  Had EEG 08/11/15, placed yellow fall risk band on left hand, using a cane, slow gait, , having chills last couple days in the evenings, low temp today; Appetite good, sees Dr. Alen Blew 2/710/16,  Dr. Delice Lesch  10/26/14 4:37 PM BP 132/70 mmHg  Pulse 77  Temp(Src) 99 F (37.2 C) (Oral)  Resp 20  Ht 5\' 6"  (1.676 m)  Wt 205 lb 14.4 oz (93.396 kg)  BMI 33.25 kg/m2  SpO2 100%  Wt Readings from Last 3 Encounters:  08/27/15 205 lb 14.4 oz (93.396 kg)  08/11/15 200 lb 12.8 oz (91.082 kg)  07/24/15 203 lb 9.6 oz (92.352 kg)

## 2015-09-08 ENCOUNTER — Other Ambulatory Visit: Payer: Self-pay | Admitting: Gastroenterology

## 2015-09-08 DIAGNOSIS — D649 Anemia, unspecified: Secondary | ICD-10-CM

## 2015-09-11 ENCOUNTER — Ambulatory Visit
Admission: RE | Admit: 2015-09-11 | Discharge: 2015-09-11 | Disposition: A | Discharge status: Home / Self Care | Payer: Medicare Other | Source: Ambulatory Visit | Attending: Gastroenterology | Admitting: Gastroenterology

## 2015-09-11 ENCOUNTER — Telehealth: Payer: Self-pay | Admitting: *Deleted

## 2015-09-11 DIAGNOSIS — D649 Anemia, unspecified: Secondary | ICD-10-CM

## 2015-09-11 MED ORDER — IOPAMIDOL (ISOVUE-300) INJECTION 61%
100.0000 mL | Freq: Once | INTRAVENOUS | Status: AC | PRN
  Administered 2015-09-11: 100 mL via INTRAVENOUS

## 2015-09-11 NOTE — Telephone Encounter (Signed)
Pt called to state he is currently not scheduled to see Dr Alen Blew until Feb 2017-   " but I saw Dr Watt Climes the other day and my lab work is down and he did a CT so I thought maybe I should see Dr Alen Blew earlier then February "  This RN informed pt MD is out of the office until 12/27- but this note would be sent to him for review and recommendation regarding an appointment.

## 2015-09-15 ENCOUNTER — Encounter: Payer: Self-pay | Admitting: Radiation Therapy

## 2015-09-15 ENCOUNTER — Other Ambulatory Visit: Payer: Self-pay | Admitting: Radiation Therapy

## 2015-09-15 DIAGNOSIS — D320 Benign neoplasm of cerebral meninges: Secondary | ICD-10-CM

## 2015-09-15 NOTE — Progress Notes (Addendum)
Expand All Collapse All   1. Do you need a wheel chair? no  2. On oxygen? no  3. Have you ever had any surgery in the body part being scanned? Yes Craniotomy on 09/27/13 and then again on 02/17/15 by Dr. Kathyrn Sheriff to remove a meningioma  4. Have you ever had any surgery on your brain or heart? Brain, please see #3.   5. Have you ever had surgery on your eyes or ears? No   6. Do you have a pacemaker or defibrillator? no  7. Do you have a Neurostimulator? no   8. Claustrophobic? no  9. Any risk for metal in eyes? no  10. Injury by bullet, buckshot, or shrapnel? no  11. Stent? no   12. Hx of Cancer? No, Recurrent Meningioma    13. Kidney or Liver disease? no  14. Hx of Lupus, Rheumatoid Arthritis or Scleroderma? no  15. IV Antibiotics or long term use of NSAIDS? no  16. HX of Hypertension? no  17. Diabetes? no  18. Allergy to contrast? no  19. Recent labs.Scheduled for 2/10 with Medical Oncology and should be in [facility]

## 2015-09-16 ENCOUNTER — Telehealth: Payer: Self-pay | Admitting: Oncology

## 2015-09-16 ENCOUNTER — Telehealth: Payer: Self-pay | Admitting: *Deleted

## 2015-09-16 DIAGNOSIS — D649 Anemia, unspecified: Secondary | ICD-10-CM

## 2015-09-16 NOTE — Telephone Encounter (Signed)
Patient called stating that he was feeling exhausted with occasional SOB (told by his home health nurse). RN called patient and was told he had a low blood count from recently drawn labs at a doctors office. Instructed patient to come tomorrow to get labs drawn. Patient verbalized understanding. POF sent to scheduler

## 2015-09-16 NOTE — Telephone Encounter (Signed)
Received a voicemail from a [facility] nurse doing a home assessment for this patient and request a sooner appointment due to low lab and fatique

## 2015-09-16 NOTE — Telephone Encounter (Signed)
Please contact the patient and let him know that we got the message. We need the results of his blood work from Dr. Watt Climes to determine how soon he needs to be seen. CT results are available and have been reviewed.

## 2015-09-16 NOTE — Telephone Encounter (Signed)
Patient called again today and left message on voicemail stating he has called 5 times and he does not know why I am not returning his calls. Per patient he needs to be seen asap by Dr. Alen Blew due to changes in his blood work. I spoke with patient yesterday and explained that Dr. Alen Blew was not in the office Friday afternoon and yesterday was his first day back in the office. Patient was informed that his message had been sent to Dr. Alen Blew (see below) and once Dr. Alen Blew had reviewed the message and provided information for when he would see [name] we would call him re an appointment. Message left for desk nurse and this message has been forwarded to Dr. Alen Blew re patient's request.

## 2015-09-16 NOTE — Telephone Encounter (Signed)
Added lab for 12/29 per 12/28 pof. No 10 am slot available - changed time to 9:30 am - spoke with patient he is aware.

## 2015-09-17 ENCOUNTER — Other Ambulatory Visit (HOSPITAL_BASED_OUTPATIENT_CLINIC_OR_DEPARTMENT_OTHER): Payer: Medicare Other

## 2015-09-17 ENCOUNTER — Other Ambulatory Visit: Payer: Self-pay | Admitting: Oncology

## 2015-09-17 DIAGNOSIS — D649 Anemia, unspecified: Secondary | ICD-10-CM

## 2015-09-17 DIAGNOSIS — D500 Iron deficiency anemia secondary to blood loss (chronic): Secondary | ICD-10-CM

## 2015-09-17 LAB — CBC WITH DIFFERENTIAL/PLATELET
BASO%: 0.4 % (ref 0.0–2.0)
Basophils Absolute: 0 10*3/uL (ref 0.0–0.1)
EOS%: 1.8 % (ref 0.0–7.0)
Eosinophils Absolute: 0.2 10*3/uL (ref 0.0–0.5)
HCT: 30.6 % — ABNORMAL LOW (ref 38.4–49.9)
HGB: 9.6 g/dL — ABNORMAL LOW (ref 13.0–17.1)
LYMPH%: 16.5 % (ref 14.0–49.0)
MCH: 29.9 pg (ref 27.2–33.4)
MCHC: 31.4 g/dL — ABNORMAL LOW (ref 32.0–36.0)
MCV: 95.3 fL (ref 79.3–98.0)
MONO#: 0.9 10*3/uL (ref 0.1–0.9)
MONO%: 10.1 % (ref 0.0–14.0)
NEUT%: 71.2 % (ref 39.0–75.0)
NEUTROS ABS: 6 10*3/uL (ref 1.5–6.5)
PLATELETS: 310 10*3/uL (ref 140–400)
RBC: 3.21 10*6/uL — AB (ref 4.20–5.82)
RDW: 15.1 % — ABNORMAL HIGH (ref 11.0–14.6)
WBC: 8.4 10*3/uL (ref 4.0–10.3)
lymph#: 1.4 10*3/uL (ref 0.9–3.3)

## 2015-09-17 LAB — HOLD TUBE, BLOOD BANK

## 2015-09-18 ENCOUNTER — Telehealth: Payer: Self-pay | Admitting: *Deleted

## 2015-09-18 NOTE — Telephone Encounter (Signed)
Per staff message and POF I have scheduled appts. Advised scheduler of appts. JMW  

## 2015-09-22 ENCOUNTER — Other Ambulatory Visit (HOSPITAL_BASED_OUTPATIENT_CLINIC_OR_DEPARTMENT_OTHER): Payer: Medicare Other

## 2015-09-22 DIAGNOSIS — D500 Iron deficiency anemia secondary to blood loss (chronic): Secondary | ICD-10-CM | POA: Diagnosis not present

## 2015-09-22 LAB — IRON AND TIBC
%SAT: 28 % (ref 20–55)
IRON: 121 ug/dL (ref 42–163)
TIBC: 431 ug/dL — AB (ref 202–409)
UIBC: 311 ug/dL (ref 117–376)

## 2015-09-22 LAB — FERRITIN: FERRITIN: 30 ng/mL (ref 22–316)

## 2015-09-25 ENCOUNTER — Ambulatory Visit (HOSPITAL_BASED_OUTPATIENT_CLINIC_OR_DEPARTMENT_OTHER): Payer: Medicare Other

## 2015-09-25 VITALS — BP 119/62 | HR 77 | Temp 98.0°F | Resp 17

## 2015-09-25 DIAGNOSIS — D500 Iron deficiency anemia secondary to blood loss (chronic): Secondary | ICD-10-CM

## 2015-09-25 MED ORDER — SODIUM CHLORIDE 0.9 % IV SOLN
Freq: Once | INTRAVENOUS | Status: AC
  Administered 2015-09-25: 12:00:00 via INTRAVENOUS

## 2015-09-25 MED ORDER — HEPARIN SOD (PORK) LOCK FLUSH 100 UNIT/ML IV SOLN
500.0000 [IU] | Freq: Once | INTRAVENOUS | Status: DC
  Filled 2015-09-25: qty 5

## 2015-09-25 MED ORDER — SODIUM CHLORIDE 0.9 % IV SOLN
510.0000 mg | Freq: Once | INTRAVENOUS | Status: AC
  Administered 2015-09-25: 510 mg via INTRAVENOUS
  Filled 2015-09-25: qty 17

## 2015-09-25 MED ORDER — SODIUM CHLORIDE 0.9 % IJ SOLN
10.0000 mL | INTRAMUSCULAR | Status: DC | PRN
  Filled 2015-09-25: qty 10

## 2015-09-25 NOTE — Patient Instructions (Signed)

## 2015-10-02 ENCOUNTER — Ambulatory Visit (HOSPITAL_BASED_OUTPATIENT_CLINIC_OR_DEPARTMENT_OTHER): Payer: Medicare Other

## 2015-10-02 VITALS — BP 93/49 | HR 77 | Temp 97.5°F | Resp 18

## 2015-10-02 DIAGNOSIS — D500 Iron deficiency anemia secondary to blood loss (chronic): Secondary | ICD-10-CM | POA: Diagnosis not present

## 2015-10-02 MED ORDER — SODIUM CHLORIDE 0.9 % IV SOLN
510.0000 mg | Freq: Once | INTRAVENOUS | Status: AC
  Administered 2015-10-02: 510 mg via INTRAVENOUS
  Filled 2015-10-02: qty 17

## 2015-10-02 MED ORDER — SODIUM CHLORIDE 0.9 % IV SOLN: Freq: Once | INTRAVENOUS | Status: DC

## 2015-10-02 NOTE — Patient Instructions (Signed)

## 2015-10-27 ENCOUNTER — Encounter: Payer: Self-pay | Admitting: Neurology

## 2015-10-27 ENCOUNTER — Ambulatory Visit (INDEPENDENT_AMBULATORY_CARE_PROVIDER_SITE_OTHER): Payer: Medicare Other | Admitting: Neurology

## 2015-10-27 VITALS — BP 114/76 | HR 81 | Ht 66.0 in | Wt 198.0 lb

## 2015-10-27 DIAGNOSIS — G40109 Localization-related (focal) (partial) symptomatic epilepsy and epileptic syndromes with simple partial seizures, not intractable, without status epilepticus: Secondary | ICD-10-CM

## 2015-10-27 DIAGNOSIS — D420 Neoplasm of uncertain behavior of cerebral meninges: Secondary | ICD-10-CM

## 2015-10-27 MED ORDER — LEVETIRACETAM 500 MG PO TABS: ORAL_TABLET | ORAL | Status: DC

## 2015-10-27 NOTE — Patient Instructions (Signed)
1. Continue Keppra 500mg : Take 1 & 1/2 tablets twice a day 2. Follow-up in 8 months, call for any changes  Seizure Precautions: 1. If medication has been prescribed for you to prevent seizures, take it exactly as directed.  Do not stop taking the medicine without talking to your doctor first, even if you have not had a seizure in a long time.   2. Avoid activities in which a seizure would cause danger to yourself or to others.  Don't operate dangerous machinery, swim alone, or climb in high or dangerous places, such as on ladders, roofs, or girders.  Do not drive unless your doctor says you may.  3. If you have any warning that you may have a seizure, lay down in a safe place where you can't hurt yourself.    4.  No driving for 6 months from last seizure, as per [facility].   Please refer to the following link on the [facility] website for more information: http://www.epilepsyfoundation.org/answerplace/Social/driving/drivingu.cfm   5.  Maintain good sleep hygiene. Avoid alcohol  6.  Contact your doctor if you have any problems that may be related to the medicine you are taking.  7.  Call 911 and bring the patient back to the ED if:        A.  The seizure lasts longer than 5 minutes.       B.  The patient doesn't awaken shortly after the seizure  C.  The patient has new problems such as difficulty seeing, speaking or moving  D.  The patient was injured during the seizure  E.  The patient has a temperature over 102 F (39C)  F.  The patient vomited and now is having trouble breathing

## 2015-10-27 NOTE — Progress Notes (Signed)
NEUROLOGY FOLLOW UP OFFICE NOTE  [name] [id number]  HISTORY OF PRESENT ILLNESS: I had the pleasure of seeing [name] in follow-up in the neurology clinic on 10/27/2015.  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 He is again accompanied by his wife who helps supplement the history. He had left arm tonic and clonic activity last October 2016 followed by transient left arm and leg weakness. He was started on Keppra 500mg  BID but continue dto report intermittent left arm numbness, possible simple sensory seizures. Keppra dose increased to 750mg  BID. His routine wake and drowsy EEG was normal. He denies any further episodes of jerking or numbness since his last visit. Strength now at baseline, with baseline mild residual left-sided weakness. He denies any headaches, dizziness, no falls. No side effects on higher dose Keppra.  HPI: This is a very pleasant 82 yo RH man with a history of recurrent grade II meningioma of the right frontoparietal convexity s/p resection followed by salvage fractionated stereotactic radiotherapy, followed by salvage resection with new recurrence. He reports having 3 seizures, most recently last 07/11/15. On review of previous records, he started having left-sided sensory changes and pain in September 2014, followed by progressive weakness. He was diagnosed with the meningioma in December 2014 and underwent resection last January 2015. Post-op MRI showed complete resection. His 50-month MRI in July 2015 showed recurrent disease and he underwent radiotherapy. In October 2015, he had stopped Decadron without taper and reported 2 episodes of shaking of the left arm that took over his entire body. Decadron was restarted. He then started to have worsening left-sided weakness, with MRI showing fairly significant recurrence, he underwent another resection last May 2016. He was started on Keppra after the surgery, then reports  tapering this off. On his 06/26/15, he started reporting episodes of intermittent numbness and loss of control of the left arm, then on 07/11/15, after working in the yard, he was relaxing for 30-40 minutes when his left hand started shaking for 3-5 minutes, followed by flaccidity of the arm. Then his left arm "shot up," he grabbed it but could not pull it down. This was followed by tremendous pain in his arm, "like it was in a vise," he stood up and his left leg felt heavy. He could not get up their porch steps and lay on the steps for a few minutes, then managed to get to the door. Symptoms started easing off after 30 minutes. His wife noticed his speech was slurred. He was weak the next day, and started feeling back to baseline 2 days later. He saw Dr. Tammi Klippel and was started on Keppra 500mg  BID. He reports the episodes of intermittent numbness in his left arm have decreased, still occurring on a daily basis, feeling like his arm was in a sleeve for a few minutes. If he tries to use his hand, it would become tremulous. His left leg still feels heavy, but he reports these symptoms "since day 1 in 2014." He denies any recent falls. He occasionally notices that some words don't come out like he wants them to. He denies any headaches, dizziness, diplopia, dysphagia, neck pain, bowel/bladder dysfunction. He has some back pain. He denies any olfactory/gustatory hallucinations, deja vu, rising epigastric sensation.   I personally reviewed MRI brain done 05/15/15 which noted overall progression of meningioma compared to 02/2015 study. There is dural thickening with nodularity which is likely due to tumor along the dura.  In addition, several nodular implants have grown significantly including a 9x37mm enhancing mass along the anterior margin of the craniotomy. The 75mm nodule along the posterior aspect of the craniotomy has also progressed. There is tumor filling the superior sagittal sinus as noted previously and this also  appears to have progressed. The superior sagittal sinus appears occluded by tumor. There is also some tumor extending through the craniotomy site over the convexity.  Epilepsy Risk Factors: Atypical right frontoparietal meningioma s/p craniotomy x 2. Otherwise he had a normal birth and early development. There is no history of febrile convulsions, CNS infections such as meningitis/encephalitis, significant traumatic brain injury, or family history of seizures.  PAST MEDICAL HISTORY: Past Medical History  Diagnosis Date  . Anemia   . Prostate cancer ([place])   . Basal cell cancer   . Hyperlipidemia   . Restless leg   . GERD (gastroesophageal reflux disease)   . Brain tumor ([place])   . History of radiation therapy     back approximately 2010 prostate radiation under care of Dr. Valere Dross   . Hypertension   . Herniated disc     lumbar spine  . OSA (obstructive sleep apnea) 10/28/2014    Moderate with AHI 21/hr  . Meningioma ([place])     brain  . Hiatal hernia   . Occult blood in stools   . Nocturia   . Aortic insufficiency 08/11/2015    MEDICATIONS: Current Outpatient Prescriptions on File Prior to Visit  Medication Sig Dispense Refill  . acetaminophen (TYLENOL) 500 MG tablet Take 1,000 mg by mouth every 8 (eight) hours as needed (pain).    Marland Kitchen b complex vitamins tablet Take 1 tablet by mouth daily.    . famotidine (PEPCID) 20 MG tablet Take 20 mg by mouth at bedtime.    . ferrous sulfate 325 (65 FE) MG tablet Take 650 mg by mouth 1 day or 1 dose.     . levETIRAcetam (KEPPRA) 500 MG tablet Take 1 & 1/2 tablets twice a day 90 tablet 6  . magnesium gluconate (MAGONATE) 500 MG tablet Take 500 mg by mouth daily.    Marland Kitchen omeprazole (PRILOSEC) 20 MG capsule Take 1 capsule (20 mg total) by mouth daily. 30 capsule 1  . Probiotic Product (PROBIOTIC DAILY PO) Take 1 capsule by mouth daily.     Marland Kitchen rOPINIRole (REQUIP) 3 MG tablet Take 3 mg by mouth at bedtime. Pt takes 1 hour before bedtime  3  .  solifenacin (VESICARE) 5 MG tablet Take 1 tablet (5 mg total) by mouth daily. 30 tablet 1  . tamsulosin (FLOMAX) 0.4 MG CAPS capsule Take 1 capsule (0.4 mg total) by mouth daily after supper. 30 capsule 1  . valsartan (DIOVAN) 160 MG tablet Take 160 mg by mouth daily.    . vitamin B-12 (CYANOCOBALAMIN) 100 MCG tablet Take 100 mcg by mouth daily.      No current facility-administered medications on file prior to visit.    ALLERGIES: Allergies  Allergen Reactions  . Asa [Aspirin] Nausea Only  . Other Itching and Rash    Nuts  . Vicodin [Hydrocodone-Acetaminophen] Nausea Only    FAMILY HISTORY: Family History  Problem Relation Age of Onset  . Parkinsonism Mother   . Dementia Father     SOCIAL HISTORY: Social History   Social History  . Marital Status: Married    Spouse Name: [name]  . Number of Children: 2  . Years of Education: 12   Occupational History  .  Retired, Furniture conservator/restorer    Social History Main Topics  . Smoking status: Never Smoker   . Smokeless tobacco: Former Systems developer    Types: Emily date: 10/08/1973  . Alcohol Use: No  . Drug Use: No  . Sexual Activity: Not Currently   Other Topics Concern  . Not on file   Social History Narrative   Patient is married([name]) lives with wife in [place].  Normally independent of ADLs.   Patient is retired.   Patient has two children.   Patient has a high school education.   Right handed          REVIEW OF SYSTEMS: Constitutional: No fevers, chills, or sweats, no generalized fatigue, change in appetite Eyes: No visual changes, double vision, eye pain Ear, nose and throat: No hearing loss, ear pain, nasal congestion, sore throat Cardiovascular: No chest pain, palpitations Respiratory:  No shortness of breath at rest or with exertion, wheezes GastrointestinaI: No nausea, vomiting, diarrhea, abdominal pain, fecal incontinence Genitourinary:  No dysuria, urinary retention or frequency Musculoskeletal:  No  neck pain, back pain Integumentary: No rash, pruritus, skin lesions Neurological: as above Psychiatric: No depression, insomnia, anxiety Endocrine: No palpitations, fatigue, diaphoresis, mood swings, change in appetite, change in weight, increased thirst Hematologic/Lymphatic:  No anemia, purpura, petechiae. Allergic/Immunologic: no itchy/runny eyes, nasal congestion, recent allergic reactions, rashes  PHYSICAL EXAM: Filed Vitals:   10/27/15 1552  BP: 114/76  Pulse: 81   General: No acute distress Head:  Normocephalic/atraumatic Neck: supple, no paraspinal tenderness, full range of motion Heart:  Regular rate and rhythm Lungs:  Clear to auscultation bilaterally Back: No paraspinal tenderness Skin/Extremities: No rash, no edema Neurological Exam:alert and oriented to person, place, and time, no dysarthria or aphasia, Fund of knowledge is appropriate. Recent and remote memory are intact. Attention and concentration are normal. Able to name objects and repeat phrases. Cranial nerves: CN I: not tested CN II: pupils equal, round and reactive to light, visual fields intact CN III, IV, VI: full range of motion, no nystagmus, no ptosis CN V: intact to light touch CN VII: upper and lower face symmetric CN VIII: hearing intact to finger rub CN IX, X: gag intact, uvula midline CN XI: sternocleidomastoid and trapezius muscles intact CN XII: tongue midline Bulk & Tone: normal, no fasciculations. Motor: 4/5 left UE with decreased fine finger movements on left hand, 4+/5 left LE (similar to prior) Sensation: Intact to light touch. Romberg test negative Deep Tendon Reflexes: brisk +2 throughout, no ankle clonus Plantar responses: downgoing bilaterally Cerebellar: no incoordination on finger to nose with mild ataxic hemiparesis on left UE Gait: spastic hemiparetic gait with left arm flexed at the wrist and dragging of left leg (similar to prior) Tremor: none today  IMPRESSION: This is  a pleasant 82 yo RH man with a history of atypical meningioma s/p resection and stereotactic radiation treatment. He had a focal motor seizure last 07/11/15, then was reporting recurrent episodes of left arm numbness, which may represent simple partial sensory seizures. Routine EEG normal. He denies any further similar episodes with increase in Keppra to 750mg  BID, no side effects. Continue current dose. He has a repeat MRI scheduled this month. He reports he is now at baseline strength and feels well overall except for fatigue and continues to follow-up with Oncology for anemia. He understands [place] driving laws to stop driving after a seizure, until 6 months seizure-free. He will follow-up in 8 months and knows to call our office  for any changes.   Thank you for allowing me to participate in his care.  Please do not hesitate to call for any questions or concerns.  The duration of this appointment visit was 25 minutes of face-to-face time with the patient.  Greater than 50% of this time was spent in counseling, explanation of diagnosis, planning of further management, and coordination of care.   Ellouise Newer, M.D.   CC: Dr. Harrington Challenger, Dr. Tammi Klippel

## 2015-10-29 ENCOUNTER — Other Ambulatory Visit: Payer: Self-pay | Admitting: Oncology

## 2015-10-29 ENCOUNTER — Encounter: Payer: Self-pay | Admitting: *Deleted

## 2015-10-29 DIAGNOSIS — D500 Iron deficiency anemia secondary to blood loss (chronic): Secondary | ICD-10-CM

## 2015-10-30 ENCOUNTER — Telehealth: Payer: Self-pay | Admitting: Oncology

## 2015-10-30 ENCOUNTER — Ambulatory Visit (HOSPITAL_BASED_OUTPATIENT_CLINIC_OR_DEPARTMENT_OTHER): Payer: Medicare Other | Admitting: Oncology

## 2015-10-30 ENCOUNTER — Other Ambulatory Visit (HOSPITAL_BASED_OUTPATIENT_CLINIC_OR_DEPARTMENT_OTHER): Payer: Medicare Other

## 2015-10-30 VITALS — BP 144/69 | HR 75 | Temp 98.1°F | Resp 16 | Ht 66.0 in | Wt 200.4 lb

## 2015-10-30 DIAGNOSIS — Z8546 Personal history of malignant neoplasm of prostate: Secondary | ICD-10-CM

## 2015-10-30 DIAGNOSIS — D509 Iron deficiency anemia, unspecified: Secondary | ICD-10-CM

## 2015-10-30 DIAGNOSIS — D500 Iron deficiency anemia secondary to blood loss (chronic): Secondary | ICD-10-CM

## 2015-10-30 LAB — CBC WITH DIFFERENTIAL/PLATELET
BASO%: 1 % (ref 0.0–2.0)
BASOS ABS: 0.1 10*3/uL (ref 0.0–0.1)
EOS ABS: 0.1 10*3/uL (ref 0.0–0.5)
EOS%: 1.6 % (ref 0.0–7.0)
HCT: 45.6 % (ref 38.4–49.9)
HEMOGLOBIN: 14.6 g/dL (ref 13.0–17.1)
LYMPH%: 12.1 % — AB (ref 14.0–49.0)
MCH: 31.4 pg (ref 27.2–33.4)
MCHC: 32 g/dL (ref 32.0–36.0)
MCV: 98 fL (ref 79.3–98.0)
MONO#: 0.7 10*3/uL (ref 0.1–0.9)
MONO%: 8.5 % (ref 0.0–14.0)
NEUT%: 76.8 % — ABNORMAL HIGH (ref 39.0–75.0)
NEUTROS ABS: 6.7 10*3/uL — AB (ref 1.5–6.5)
PLATELETS: 230 10*3/uL (ref 140–400)
RBC: 4.66 10*6/uL (ref 4.20–5.82)
RDW: 15.9 % — ABNORMAL HIGH (ref 11.0–14.6)
WBC: 8.8 10*3/uL (ref 4.0–10.3)
lymph#: 1.1 10*3/uL (ref 0.9–3.3)

## 2015-10-30 NOTE — Telephone Encounter (Signed)
Gave patient avs  Report and appointments for May.

## 2015-10-30 NOTE — Progress Notes (Signed)
Hematology and Oncology Follow Up Visit  [name] [id number] 1931/08/12 82 y.o. 10/30/2015 1:43 PM  Melinda Crutch, MDRoss, [name], MD   Principle Diagnosis: 82 year old gentleman with iron deficiency anemia diagnosed in February 2016. Presented with hemoglobin of 8.8, RDW of 16.9 and ferritin of 14.8. His GI workup so far has been unrevealing.   Prior Therapy: Status post IV iron for a total of 1 g given in March 2016. This was repeated in January 2017.  Current therapy: Oral iron supplements. He is taking 325 mg tablet twice a day.  Interim History: [name] presents today for a follow-up visit. Since the last visit, he reports feeling about the same. He did notice some decline in his energy and performance status and found to be anemic with a hemoglobin around 7. He increase his iron to 4 times a day and his hemoglobin in January 2017 was up to 9. He received IV iron with significant improvement in his symptoms. He tolerated infusion well. He is currently taking iron twice a day with good tolerance. Has not reported any dyspepsia, constipation or abdominal pain. He is taking reflux medication including Prilosec and Pepcid.  He has not reported any GI complaints. He has not reported any hematochezia or melena. Does not report any hemoptysis or hematemesis.  He does not report any headaches, blurry vision, double vision or syncope or seizures. He does not report any chest pain, palpitation or leg edema or orthopnea. He does report wheezing and exertional dyspnea but no cough or hemoptysis. He does not report any nausea, vomiting, abdominal pain, early satiety. He does not report any frequency, urgency or hesitancy. He does not report any skeletal complaints. Rest of his review of systems unremarkable.   Medications: I have reviewed the patient's current medications.  Current Outpatient Prescriptions  Medication Sig Dispense Refill  . acetaminophen (TYLENOL) 500 MG tablet Take 1,000 mg by mouth  every 8 (eight) hours as needed (pain).    Marland Kitchen b complex vitamins tablet Take 1 tablet by mouth daily.    . famotidine (PEPCID) 20 MG tablet Take 20 mg by mouth at bedtime.    . ferrous sulfate 325 (65 FE) MG tablet Take 650 mg by mouth 1 day or 1 dose.     . levETIRAcetam (KEPPRA) 500 MG tablet Take 1 & 1/2 tablets twice a day 90 tablet 11  . magnesium gluconate (MAGONATE) 500 MG tablet Take 500 mg by mouth daily.    . multivitamin-lutein (OCUVITE-LUTEIN) CAPS capsule Take 1 capsule by mouth daily.    Marland Kitchen omeprazole (PRILOSEC) 20 MG capsule Take 1 capsule (20 mg total) by mouth daily. 30 capsule 1  . Probiotic Product (PROBIOTIC DAILY PO) Take 1 capsule by mouth daily.     Marland Kitchen rOPINIRole (REQUIP) 3 MG tablet Take 3 mg by mouth at bedtime. Pt takes 1 hour before bedtime  3  . solifenacin (VESICARE) 5 MG tablet Take 1 tablet (5 mg total) by mouth daily. 30 tablet 1  . tamsulosin (FLOMAX) 0.4 MG CAPS capsule Take 1 capsule (0.4 mg total) by mouth daily after supper. 30 capsule 1  . valsartan (DIOVAN) 160 MG tablet Take 160 mg by mouth daily.    . vitamin B-12 (CYANOCOBALAMIN) 100 MCG tablet Take 100 mcg by mouth daily.      No current facility-administered medications for this visit.     Allergies:  Allergies  Allergen Reactions  . Asa [Aspirin] Nausea Only  . Other Itching and Rash  Nuts  . Vicodin [Hydrocodone-Acetaminophen] Nausea Only    Past Medical History, Surgical history, Social history, and Family History were reviewed and updated.   Physical Exam: Blood pressure 144/69, pulse 75, temperature 98.1 F (36.7 C), temperature source Oral, resp. rate 16, height 5\' 6"  (1.676 m), weight 200 lb 6.4 oz (90.901 kg), SpO2 100 %. ECOG: 1 General appearance: alert and cooperative without distress. Head: Normocephalic, without obvious abnormality scalp erythema have resolved. Neck: no adenopathy Lymph nodes: Cervical, supraclavicular, and axillary nodes normal. Heart:regular rate and  rhythm, S1, S2 normal, no murmur, click, rub or gallop Lung:chest clear, no wheezing, rales, normal symmetric air entry Abdomin: soft, non-tender, without masses or organomegaly no shifting dullness or ascites. EXT:no erythema, induration, or nodules   Lab Results: Lab Results  Component Value Date   WBC 8.8 10/30/2015   HGB 14.6 10/30/2015   HCT 45.6 10/30/2015   MCV 98.0 10/30/2015   PLT 230 10/30/2015     Chemistry      Component Value Date/Time   NA 135 02/10/2015 1300   NA 139 11/25/2014 1414   K 3.6 02/10/2015 1300   K 3.8 11/25/2014 1414   CL 103 02/10/2015 1300   CO2 23 02/10/2015 1300   CO2 23 11/25/2014 1414   BUN 22.7 05/14/2015 0959   BUN 14 02/10/2015 1300   CREATININE 1.1 05/14/2015 0959   CREATININE 1.02 02/17/2015 2039      Component Value Date/Time   CALCIUM 9.3 02/10/2015 1300   CALCIUM 9.2 11/25/2014 1414   ALKPHOS 56 11/25/2014 1414   ALKPHOS 56 10/04/2013 0601   AST 16 11/25/2014 1414   AST 20 10/04/2013 0601   ALT 10 11/25/2014 1414   ALT 59* 10/04/2013 0601   BILITOT 0.24 11/25/2014 1414   BILITOT 0.3 10/04/2013 0601         Impression and Plan:    82 year old gentleman with the following issues:  1. Iron deficiency anemia presenting with a hemoglobin of 8.8, RDW of 16.9 and ferritin level 14.8 obtained in February 2016. The etiology is unclear and his GI workup is ongoing.   He is status post IV iron in the form of Feraheme for a total of 1 g completed in March 2016. He was repeated in January 2017.  His laboratory data were reviewed today and showed his hemoglobin is back to normal. He is still symptomatic with some fatigue and tiredness and likely not related to his anemia. I recommended continue iron supplements at this time and to separate iron from Prilosec and Axid. I will repeat his iron studies in 3 months and supplement with IV iron as needed.   2. Prostate cancer: Followed by Dr. Junious Silk. No evidence of recurrence.  3.  Meningioma: Status post surgical resection followed by radiation therapy. Repeat MRI scheduled for the near future.  3. Follow-up: 3 months to repeat laboratory testing and iron studies.   Zola Button, MD 2/10/20171:43 PM

## 2015-11-02 ENCOUNTER — Encounter: Payer: Self-pay | Admitting: Neurology

## 2015-11-09 ENCOUNTER — Ambulatory Visit
Admission: RE | Admit: 2015-11-09 | Discharge: 2015-11-09 | Disposition: A | Discharge status: Home / Self Care | Payer: Medicare Other | Source: Ambulatory Visit | Attending: Radiation Oncology | Admitting: Radiation Oncology

## 2015-11-09 DIAGNOSIS — D320 Benign neoplasm of cerebral meninges: Secondary | ICD-10-CM

## 2015-11-09 MED ORDER — GADOBENATE DIMEGLUMINE 529 MG/ML IV SOLN
20.0000 mL | Freq: Once | INTRAVENOUS | Status: AC | PRN
  Administered 2015-11-09: 20 mL via INTRAVENOUS

## 2015-11-11 ENCOUNTER — Ambulatory Visit: Payer: Medicare Other | Admitting: Radiation Oncology

## 2015-12-01 ENCOUNTER — Encounter: Payer: Self-pay | Admitting: Neurology

## 2015-12-01 ENCOUNTER — Ambulatory Visit (INDEPENDENT_AMBULATORY_CARE_PROVIDER_SITE_OTHER): Payer: Medicare Other | Admitting: Neurology

## 2015-12-01 VITALS — BP 126/78 | HR 84 | Ht 66.0 in | Wt 199.0 lb

## 2015-12-01 DIAGNOSIS — D420 Neoplasm of uncertain behavior of cerebral meninges: Secondary | ICD-10-CM | POA: Diagnosis not present

## 2015-12-01 DIAGNOSIS — G40109 Localization-related (focal) (partial) symptomatic epilepsy and epileptic syndromes with simple partial seizures, not intractable, without status epilepticus: Secondary | ICD-10-CM

## 2015-12-01 MED ORDER — LEVETIRACETAM 500 MG PO TABS: ORAL_TABLET | ORAL | Status: DC

## 2015-12-01 MED ORDER — LAMOTRIGINE 25 MG PO TABS: ORAL_TABLET | ORAL | Status: DC

## 2015-12-01 NOTE — Progress Notes (Signed)
NEUROLOGY FOLLOW UP OFFICE NOTE  [name] EX:[id number]  HISTORY OF PRESENT ILLNESS: I had the pleasure of seeing [name] in follow-up in the neurology clinic on 12/01/2015. 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 He is again accompanied by his wife who helps supplement the history. He presents for an earlier visit due to recurrent episodes of transient left-sided weakness. He was initially reporting a good response to Keppra 750mg  BID with no further left arm tonic and clonic activity since October 2016 followed by transient left arm and leg weakness, no further intermittent episodes of left arm numbness (possible simple sensory seizures). He and his wife report that on 11/21/15, he went to the yard when his left hand started to jerk a little, then suddenly he could not walk or use his left hand. He called his wife to get his walker. Symptoms started improving 30 minutes later where he could use his walker. On 11/29/15, he woke up and got out of bed and "something was not right." He could not use his left leg and his left arm was weaker. He does not recall any clonic activity, no numbness/tingling. This lasted 30-40 minutes, and he still does not feel 100% better. They report another instance on a Thursday night while going out to eat, he again could not use his left leg and sat down, went back home and was back to baseline when arriving home. No jerking, he "just could not pick up the left leg." Face was unaffected. He feels drowsy during the day with lack of energy but states he is "no spring chicken anymore."   I personally reviewed MRI brain with and without contrast done 11/09/15 which again showed resection of meningioma with stable residual tumor. The most notable tumor infiltrates the superior sagittal sinus, there is continuous tumor around the high right cerebral convexity. Stable signal abnormality in the posterior right cerebral hemisphere  with lack of mass effect suggesting post-treatment/post-compressive gliosis rather than cerebral edema.  HPI: This is a very pleasant 82 yo RH man with a history of recurrent grade II meningioma of the right frontoparietal convexity s/p resection followed by salvage fractionated stereotactic radiotherapy, followed by salvage resection with new recurrence. He reports having 3 seizures, most recently last 07/11/15. On review of previous records, he started having left-sided sensory changes and pain in September 2014, followed by progressive weakness. He was diagnosed with the meningioma in December 2014 and underwent resection last January 2015. Post-op MRI showed complete resection. His 38-month MRI in July 2015 showed recurrent disease and he underwent radiotherapy. In October 2015, he had stopped Decadron without taper and reported 2 episodes of shaking of the left arm that took over his entire body. Decadron was restarted. He then started to have worsening left-sided weakness, with MRI showing fairly significant recurrence, he underwent another resection last May 2016. He was started on Keppra after the surgery, then reports tapering this off. On his 06/26/15, he started reporting episodes of intermittent numbness and loss of control of the left arm, then on 07/11/15, after working in the yard, he was relaxing for 30-40 minutes when his left hand started shaking for 3-5 minutes, followed by flaccidity of the arm. Then his left arm "shot up," he grabbed it but could not pull it down. This was followed by tremendous pain in his arm, "like it was in a vise," he stood up and his left leg felt heavy. He could  not get up their porch steps and lay on the steps for a few minutes, then managed to get to the door. Symptoms started easing off after 30 minutes. His wife noticed his speech was slurred. He was weak the next day, and started feeling back to baseline 2 days later. He saw Dr. Tammi Klippel and was started on Keppra  500mg  BID. He reports the episodes of intermittent numbness in his left arm have decreased, still occurring on a daily basis, feeling like his arm was in a sleeve for a few minutes. If he tries to use his hand, it would become tremulous. His left leg still feels heavy, but he reports these symptoms "since day 1 in 2014." He denies any recent falls. He occasionally notices that some words don't come out like he wants them to. He denies any headaches, dizziness, diplopia, dysphagia, neck pain, bowel/bladder dysfunction. He has some back pain. He denies any olfactory/gustatory hallucinations, deja vu, rising epigastric sensation.   Diagnostic Data: I personally reviewed MRI brain done 05/15/15 which noted overall progression of meningioma compared to 02/2015 study. There is dural thickening with nodularity which is likely due to tumor along the dura. In addition, several nodular implants have grown significantly including a 9x17mm enhancing mass along the anterior margin of the craniotomy. The 45mm nodule along the posterior aspect of the craniotomy has also progressed. There is tumor filling the superior sagittal sinus as noted previously and this also appears to have progressed. The superior sagittal sinus appears occluded by tumor. There is also some tumor extending through the craniotomy site over the convexity. MRI 11/09/15 overall stable compared to prior scan. Wake and drowsy EEG on 08/07/15 normal.  Epilepsy Risk Factors: Atypical right frontoparietal meningioma s/p craniotomy x 2. Otherwise he had a normal birth and early development. There is no history of febrile convulsions, CNS infections such as meningitis/encephalitis, significant traumatic brain injury, or family history of seizures.  PAST MEDICAL HISTORY: Past Medical History  Diagnosis Date  . Anemia   . Prostate cancer ([place])   . Basal cell cancer   . Hyperlipidemia   . Restless leg   . GERD (gastroesophageal reflux disease)   . Brain  tumor ([place])   . History of radiation therapy     back approximately 2010 prostate radiation under care of Dr. Valere Dross   . Hypertension   . Herniated disc     lumbar spine  . OSA (obstructive sleep apnea) 10/28/2014    Moderate with AHI 21/hr  . Meningioma ([place])     brain  . Hiatal hernia   . Occult blood in stools   . Nocturia   . Aortic insufficiency 08/11/2015    MEDICATIONS: Current Outpatient Prescriptions on File Prior to Visit  Medication Sig Dispense Refill  . acetaminophen (TYLENOL) 500 MG tablet Take 1,000 mg by mouth every 8 (eight) hours as needed (pain).    Marland Kitchen b complex vitamins tablet Take 1 tablet by mouth daily.    . famotidine (PEPCID) 20 MG tablet Take 20 mg by mouth at bedtime.    . ferrous sulfate 325 (65 FE) MG tablet Take 650 mg by mouth 1 day or 1 dose.     . levETIRAcetam (KEPPRA) 500 MG tablet Take 1 & 1/2 tablets twice a day 90 tablet 11  . magnesium gluconate (MAGONATE) 500 MG tablet Take 500 mg by mouth daily.    . multivitamin-lutein (OCUVITE-LUTEIN) CAPS capsule Take 1 capsule by mouth daily.    Marland Kitchen omeprazole (  PRILOSEC) 20 MG capsule Take 1 capsule (20 mg total) by mouth daily. 30 capsule 1  . Probiotic Product (PROBIOTIC DAILY PO) Take 1 capsule by mouth daily.     Marland Kitchen rOPINIRole (REQUIP) 3 MG tablet Take 3 mg by mouth at bedtime. Pt takes 1 hour before bedtime  3  . solifenacin (VESICARE) 5 MG tablet Take 1 tablet (5 mg total) by mouth daily. 30 tablet 1  . tamsulosin (FLOMAX) 0.4 MG CAPS capsule Take 1 capsule (0.4 mg total) by mouth daily after supper. 30 capsule 1  . valsartan (DIOVAN) 160 MG tablet Take 160 mg by mouth daily.    . vitamin B-12 (CYANOCOBALAMIN) 100 MCG tablet Take 100 mcg by mouth daily.      No current facility-administered medications on file prior to visit.    ALLERGIES: Allergies  Allergen Reactions  . Asa [Aspirin] Nausea Only  . Other Itching and Rash    Nuts  . Vicodin [Hydrocodone-Acetaminophen] Nausea Only    FAMILY  HISTORY: Family History  Problem Relation Age of Onset  . Parkinsonism Mother   . Dementia Father     SOCIAL HISTORY: Social History   Social History  . Marital Status: Married    Spouse Name: [name]  . Number of Children: 2  . Years of Education: 12   Occupational History  . Retired, Furniture conservator/restorer    Social History Main Topics  . Smoking status: Never Smoker   . Smokeless tobacco: Former Systems developer    Types: [place] date: 10/08/1973  . Alcohol Use: No  . Drug Use: No  . Sexual Activity: Not Currently   Other Topics Concern  . Not on file   Social History Narrative   Patient is married([name]) lives with wife in [place].  Normally independent of ADLs.   Patient is retired.   Patient has two children.   Patient has a high school education.   Right handed          REVIEW OF SYSTEMS: Constitutional: No fevers, chills, or sweats, no generalized fatigue, change in appetite Eyes: No visual changes, double vision, eye pain Ear, nose and throat: No hearing loss, ear pain, nasal congestion, sore throat Cardiovascular: No chest pain, palpitations Respiratory:  No shortness of breath at rest or with exertion, wheezes GastrointestinaI: No nausea, vomiting, diarrhea, abdominal pain, fecal incontinence Genitourinary:  No dysuria, urinary retention or frequency Musculoskeletal:  No neck pain, back pain Integumentary: No rash, pruritus, skin lesions Neurological: as above Psychiatric: No depression, insomnia, anxiety Endocrine: No palpitations, fatigue, diaphoresis, mood swings, change in appetite, change in weight, increased thirst Hematologic/Lymphatic:  No anemia, purpura, petechiae. Allergic/Immunologic: no itchy/runny eyes, nasal congestion, recent allergic reactions, rashes  PHYSICAL EXAM: Filed Vitals:   12/01/15 1112  BP: 126/78  Pulse: 84   General: No acute distress Head:  Normocephalic/atraumatic Neck: supple, no paraspinal tenderness, full range of  motion Heart:  Regular rate and rhythm Lungs:  Clear to auscultation bilaterally Back: No paraspinal tenderness Skin/Extremities: No rash, no edema Neurological Exam:alert and oriented to person, place, and time, no dysarthria or aphasia, Fund of knowledge is appropriate. Recent and remote memory are intact. Attention and concentration are normal. Able to name objects and repeat phrases. Cranial nerves: CN I: not tested CN II: pupils equal, round and reactive to light, visual fields intact CN III, IV, VI: full range of motion, no nystagmus, no ptosis CN V: intact to light touch CN VII: upper and lower face symmetric CN  VIII: hearing intact to finger rub CN IX, X: gag intact, uvula midline CN XI: sternocleidomastoid and trapezius muscles intact CN XII: tongue midline Bulk & Tone: normal, no fasciculations. Motor: 4/5 left UE with decreased fine finger movements on left hand, 4+/5 left LE with orbiting around the left arm (similar to prior) Sensation: Intact to light touch. Romberg test negative Deep Tendon Reflexes: brisk +2 throughout, no ankle clonus Plantar responses: downgoing bilaterally Cerebellar: no incoordination on finger to nose with mild ataxic hemiparesis on left UE Gait: spastic hemiparetic gait with left arm flexed at the wrist and dragging of left leg (similar to prior), ambulates with cane Tremor: none today  IMPRESSION: This is a pleasant 82 yo RH man with a history of atypical meningioma s/p resection and stereotactic radiation treatment. He had a focal motor seizure last 07/11/15, then was reporting recurrent episodes of left arm numbness, which may represent simple partial sensory seizures. Routine EEG normal. The recurrent numbness episodes have stopped with increase in Keppra dose, however he has had at least 3 episodes concerning for Todd's paralysis on the left, one episode was preceded by left hand jerking, however he denies any jerking activity with the other  2 episodes. They would resolve after 30 minutes. MRI brain last month was stable. We discussed the option of further increasing Keppra, however he reports fatigue and drowsiness. He will start low dose Lamotrigine 25mg  BID x 2 weeks, then increase to 50mg  BID, continue Keppra 750mg  BID for now. We may be able to transition him to monotherapy in the future. Side effects of Lamotrigine, including Kathreen Cosier syndrome, were discussed. We also discussed [place] driving laws to stop driving after a seizure, until 6 months seizure-free. He will follow-up in 1 month and knows to call our office for any changes.   Thank you for allowing me to participate in his care.  Please do not hesitate to call for any questions or concerns.  The duration of this appointment visit was 25 minutes of face-to-face time with the patient.  Greater than 50% of this time was spent in counseling, explanation of diagnosis, planning of further management, and coordination of care.   Ellouise Newer, M.D.   CC: Dr. Harrington Challenger, Dr. Tammi Klippel

## 2015-12-01 NOTE — Patient Instructions (Signed)
1. Start Lamictal 25mg : Take 1 tablet twice a day for 2 weeks, then increase to 2 tablets twice a day and continue 2. Continue Keppra 500mg : Take 1 & 1/2 tablets twice a day 3. As per [place] driving laws, no driving until 6 months seizure-free  Seizure Precautions: 1. If medication has been prescribed for you to prevent seizures, take it exactly as directed.  Do not stop taking the medicine without talking to your doctor first, even if you have not had a seizure in a long time.   2. Avoid activities in which a seizure would cause danger to yourself or to others.  Don't operate dangerous machinery, swim alone, or climb in high or dangerous places, such as on ladders, roofs, or girders.  Do not drive unless your doctor says you may.  3. If you have any warning that you may have a seizure, lay down in a safe place where you can't hurt yourself.    4.  No driving for 6 months from last seizure, as per [facility].   Please refer to the following link on the [facility] website for more information: [email or web address]   5.  Maintain good sleep hygiene. Avoid alcohol.  6.  Contact your doctor if you have any problems that may be related to the medicine you are taking.  7.  Call 911 and bring the patient back to the ED if:        A.  The seizure lasts longer than 5 minutes.       B.  The patient doesn't awaken shortly after the seizure  C.  The patient has new problems such as difficulty seeing, speaking or moving  D.  The patient was injured during the seizure  E.  The patient has a temperature over 102 F (39C)  F.  The patient vomited and now is having trouble breathing

## 2015-12-31 ENCOUNTER — Emergency Department (HOSPITAL_COMMUNITY): Payer: Medicare Other

## 2015-12-31 ENCOUNTER — Encounter (HOSPITAL_COMMUNITY): Payer: Self-pay | Admitting: Emergency Medicine

## 2015-12-31 ENCOUNTER — Observation Stay (HOSPITAL_COMMUNITY)
Admission: EM | Admit: 2015-12-31 | Discharge: 2016-01-02 | Disposition: A | Discharge status: Skilled Nursing Facility | Payer: Medicare Other | Attending: Internal Medicine | Admitting: Internal Medicine

## 2015-12-31 ENCOUNTER — Ambulatory Visit: Payer: Medicare Other | Admitting: Neurology

## 2015-12-31 DIAGNOSIS — Z923 Personal history of irradiation: Secondary | ICD-10-CM | POA: Insufficient documentation

## 2015-12-31 DIAGNOSIS — R531 Weakness: Secondary | ICD-10-CM | POA: Diagnosis not present

## 2015-12-31 DIAGNOSIS — D320 Benign neoplasm of cerebral meninges: Secondary | ICD-10-CM | POA: Diagnosis not present

## 2015-12-31 DIAGNOSIS — K219 Gastro-esophageal reflux disease without esophagitis: Secondary | ICD-10-CM | POA: Diagnosis not present

## 2015-12-31 DIAGNOSIS — D509 Iron deficiency anemia, unspecified: Secondary | ICD-10-CM | POA: Diagnosis present

## 2015-12-31 DIAGNOSIS — W1809XA Striking against other object with subsequent fall, initial encounter: Secondary | ICD-10-CM | POA: Diagnosis not present

## 2015-12-31 DIAGNOSIS — G4733 Obstructive sleep apnea (adult) (pediatric): Secondary | ICD-10-CM | POA: Diagnosis not present

## 2015-12-31 DIAGNOSIS — Z96651 Presence of right artificial knee joint: Secondary | ICD-10-CM | POA: Insufficient documentation

## 2015-12-31 DIAGNOSIS — I10 Essential (primary) hypertension: Secondary | ICD-10-CM | POA: Diagnosis present

## 2015-12-31 DIAGNOSIS — Z79899 Other long term (current) drug therapy: Secondary | ICD-10-CM | POA: Diagnosis not present

## 2015-12-31 DIAGNOSIS — M79603 Pain in arm, unspecified: Secondary | ICD-10-CM

## 2015-12-31 DIAGNOSIS — G2581 Restless legs syndrome: Secondary | ICD-10-CM | POA: Diagnosis not present

## 2015-12-31 DIAGNOSIS — S42301A Unspecified fracture of shaft of humerus, right arm, initial encounter for closed fracture: Principal | ICD-10-CM | POA: Insufficient documentation

## 2015-12-31 DIAGNOSIS — S42309A Unspecified fracture of shaft of humerus, unspecified arm, initial encounter for closed fracture: Secondary | ICD-10-CM | POA: Insufficient documentation

## 2015-12-31 DIAGNOSIS — M25511 Pain in right shoulder: Secondary | ICD-10-CM | POA: Diagnosis present

## 2015-12-31 DIAGNOSIS — Z87891 Personal history of nicotine dependence: Secondary | ICD-10-CM | POA: Diagnosis not present

## 2015-12-31 DIAGNOSIS — R29898 Other symptoms and signs involving the musculoskeletal system: Secondary | ICD-10-CM

## 2015-12-31 HISTORY — DX: Unspecified convulsions: R56.9

## 2015-12-31 LAB — BASIC METABOLIC PANEL
ANION GAP: 7 (ref 5–15)
BUN: 11 mg/dL (ref 6–20)
CO2: 27 mmol/L (ref 22–32)
Calcium: 8.5 mg/dL — ABNORMAL LOW (ref 8.9–10.3)
Chloride: 104 mmol/L (ref 101–111)
Creatinine, Ser: 1.22 mg/dL (ref 0.61–1.24)
GFR, EST NON AFRICAN AMERICAN: 53 mL/min — AB (ref >60)
Glucose, Bld: 95 mg/dL (ref 65–99)
POTASSIUM: 3.9 mmol/L (ref 3.5–5.1)
SODIUM: 138 mmol/L (ref 135–145)

## 2015-12-31 LAB — CBC WITH DIFFERENTIAL/PLATELET
BASOS ABS: 0.1 10*3/uL (ref 0.0–0.1)
BASOS PCT: 1 %
EOS ABS: 0.1 10*3/uL (ref 0.0–0.7)
EOS PCT: 1 %
HCT: 36.5 % — ABNORMAL LOW (ref 39.0–52.0)
Hemoglobin: 11.9 g/dL — ABNORMAL LOW (ref 13.0–17.0)
Lymphocytes Relative: 16 %
Lymphs Abs: 1.4 10*3/uL (ref 0.7–4.0)
MCH: 30.6 pg (ref 26.0–34.0)
MCHC: 32.6 g/dL (ref 30.0–36.0)
MCV: 93.8 fL (ref 78.0–100.0)
Monocytes Absolute: 0.8 10*3/uL (ref 0.1–1.0)
Monocytes Relative: 10 %
NEUTROS PCT: 72 %
Neutro Abs: 6.4 10*3/uL (ref 1.7–7.7)
PLATELETS: 277 10*3/uL (ref 150–400)
RBC: 3.89 MIL/uL — AB (ref 4.22–5.81)
RDW: 13.8 % (ref 11.5–15.5)
WBC: 8.7 10*3/uL (ref 4.0–10.5)

## 2015-12-31 MED ORDER — LEVETIRACETAM 750 MG PO TABS
750.0000 mg | ORAL_TABLET | Freq: Once | ORAL | Status: AC
  Administered 2015-12-31: 750 mg via ORAL
  Filled 2015-12-31: qty 1

## 2015-12-31 MED ORDER — OXYCODONE-ACETAMINOPHEN 5-325 MG PO TABS
2.0000 | ORAL_TABLET | Freq: Once | ORAL | Status: AC
Start: 2015-12-31 — End: 2015-12-31
  Administered 2015-12-31: 2 via ORAL
  Filled 2015-12-31: qty 2

## 2015-12-31 MED ORDER — OXYCODONE-ACETAMINOPHEN 5-325 MG PO TABS: 1.0000 | ORAL_TABLET | Freq: Three times a day (TID) | ORAL | Status: DC | PRN

## 2015-12-31 MED ORDER — OXYCODONE-ACETAMINOPHEN 5-325 MG PO TABS
1.0000 | ORAL_TABLET | Freq: Once | ORAL | Status: DC
  Filled 2015-12-31: qty 1

## 2015-12-31 MED ORDER — LAMOTRIGINE 25 MG PO TABS
50.0000 mg | ORAL_TABLET | Freq: Once | ORAL | Status: AC
  Administered 2015-12-31: 50 mg via ORAL
  Filled 2015-12-31: qty 2

## 2015-12-31 MED ORDER — OXYCODONE-ACETAMINOPHEN 5-325 MG PO TABS
2.0000 | ORAL_TABLET | Freq: Once | ORAL | Status: AC
  Administered 2015-12-31: 2 via ORAL
  Filled 2015-12-31: qty 2

## 2015-12-31 MED ORDER — ONDANSETRON 4 MG PO TBDP
4.0000 mg | ORAL_TABLET | Freq: Once | ORAL | Status: AC | PRN
  Administered 2015-12-31: 4 mg via ORAL
  Filled 2015-12-31: qty 1

## 2015-12-31 NOTE — ED Provider Notes (Signed)
CSN: [id number]     Arrival date & time 12/31/15  1426 History   First MD Initiated Contact with Patient 12/31/15 1449     Chief Complaint  Patient presents with  . Fall  . Shoulder Injury     (Consider location/radiation/quality/duration/timing/severity/associated sxs/prior Treatment) Patient is a 82 y.o. male presenting with fall and shoulder injury.  Fall This is a new problem. The current episode started 1 to 2 hours ago. The problem has not changed since onset.Pertinent negatives include no abdominal pain and no shortness of breath. Nothing aggravates the symptoms. Nothing relieves the symptoms. He has tried nothing for the symptoms.  Shoulder Injury The current episode started 1 to 2 hours ago. The problem occurs constantly. The problem has been gradually improving. Pertinent negatives include no abdominal pain and no shortness of breath. The symptoms are aggravated by twisting. Nothing relieves the symptoms. He has tried nothing for the symptoms. The treatment provided no relief.    Past Medical History  Diagnosis Date  . Anemia   . Prostate cancer ([place])   . Basal cell cancer   . Hyperlipidemia   . Restless leg   . GERD (gastroesophageal reflux disease)   . Brain tumor ([place])   . History of radiation therapy     back approximately 2010 prostate radiation under care of Dr. Valere Dross   . Hypertension   . Herniated disc     lumbar spine  . OSA (obstructive sleep apnea) 10/28/2014    Moderate with AHI 21/hr  . Meningioma ([place])     brain  . Hiatal hernia   . Occult blood in stools   . Nocturia   . Aortic insufficiency 08/11/2015  . Seizures [facility])    Past Surgical History  Procedure Laterality Date  . Joint replacement      right knee replacement  . Bilateral cateract surgery    . Vasectomy    . Colonoscopy      colon polyp removed  . Craniotomy Right 09/27/2013    Procedure: RIGHT FRONTAL CRANIOTOMY FOR TUMOR RESECTION ;  Surgeon: Consuella Lose, MD;  Location: [place]  NEURO ORS;  Service: Neurosurgery;  Laterality: Right;  . Prostate biopsy    . Tonsillectomy      Agae 6-7  . Craniotomy Right 02/17/2015    Procedure: Right frontal parietal craniotomy for resection of meningioma with brainlab;  Surgeon: Consuella Lose, MD;  Location: [place] NEURO ORS;  Service: Neurosurgery;  Laterality: Right;  Right frontal parietal craniotomy for resection of meningioma with brainlab  . Application of cranial navigation N/A 02/17/2015    Procedure: APPLICATION OF CRANIAL NAVIGATION;  Surgeon: Consuella Lose, MD;  Location: [place] NEURO ORS;  Service: Neurosurgery;  Laterality: N/A;  APPLICATION OF CRANIAL NAVIGATION   Family History  Problem Relation Age of Onset  . Parkinsonism Mother   . Dementia Father    Social History  Substance Use Topics  . Smoking status: Never Smoker   . Smokeless tobacco: Former Systems developer    Types: [place] date: 10/08/1973  . Alcohol Use: No    Review of Systems  Constitutional: Negative for fever and chills.  Eyes: Negative for pain.  Respiratory: Negative for cough and shortness of breath.   Gastrointestinal: Negative for abdominal pain.  Endocrine: Negative for polydipsia and polyuria.  Musculoskeletal: Negative for joint swelling, gait problem and neck pain.       Right shoulder pain  Neurological: Positive for weakness (increasing in left leg).  All  other systems reviewed and are negative.     Allergies  Asa; Other; and Vicodin  Home Medications   Prior to Admission medications   Medication Sig Start Date End Date Taking? Authorizing Provider  acetaminophen (TYLENOL) 500 MG tablet Take 1,000 mg by mouth every 8 (eight) hours as needed (pain).   Yes Historical Provider, MD  b complex vitamins tablet Take 1 tablet by mouth daily.   Yes Historical Provider, MD  famotidine (PEPCID) 20 MG tablet Take 20 mg by mouth at bedtime.   Yes Historical Provider, MD  ferrous sulfate 325 (65 FE) MG tablet Take 1,300 mg by mouth every  morning.    Yes Historical Provider, MD  lamoTRIgine (LAMICTAL) 25 MG tablet Take 1 tablet twice a day for 2 weeks, then increase to 2 tablets twice a day and continue 12/01/15  Yes Cameron Sprang, MD  levETIRAcetam (KEPPRA) 500 MG tablet Take 1 & 1/2 tablets twice a day 12/01/15  Yes Cameron Sprang, MD  magnesium gluconate (MAGONATE) 500 MG tablet Take 500 mg by mouth daily.   Yes Historical Provider, MD  multivitamin-lutein (OCUVITE-LUTEIN) CAPS capsule Take 1 capsule by mouth daily.   Yes Historical Provider, MD  Probiotic Product (PROBIOTIC DAILY PO) Take 1 capsule by mouth daily.    Yes Historical Provider, MD  rOPINIRole (REQUIP) 3 MG tablet Take 3 mg by mouth at bedtime. Pt takes 1 hour before bedtime 11/18/14  Yes Historical Provider, MD  solifenacin (VESICARE) 5 MG tablet Take 1 tablet (5 mg total) by mouth daily. 10/18/13  Yes Daniel J Angiulli, PA-C  tamsulosin (FLOMAX) 0.4 MG CAPS capsule Take 1 capsule (0.4 mg total) by mouth daily after supper. 10/18/13  Yes Daniel J Angiulli, PA-C  valsartan (DIOVAN) 160 MG tablet Take 160 mg by mouth daily.   Yes Historical Provider, MD  vitamin B-12 (CYANOCOBALAMIN) 100 MCG tablet Take 100 mcg by mouth daily.    Yes Historical Provider, MD  omeprazole (PRILOSEC) 20 MG capsule Take 1 capsule (20 mg total) by mouth daily. Patient not taking: Reported on 12/31/2015 10/18/13   Lavon Paganini Angiulli, PA-C  oxyCODONE-acetaminophen (PERCOCET) 5-325 MG tablet Take 1 tablet by mouth every 8 (eight) hours as needed. 12/31/15   Corene Cornea Avante Carneiro, MD   BP 134/66 mmHg  Pulse 70  Temp(Src) 98.2 F (36.8 C) (Oral)  Resp 16  SpO2 90% Physical Exam  Constitutional: He appears well-developed and well-nourished.  HENT:  Head: Normocephalic and atraumatic.  Neck: Normal range of motion.  Cardiovascular: Normal rate.   Pulmonary/Chest: Effort normal. No respiratory distress.  Abdominal: He exhibits no distension.  Musculoskeletal: Normal range of motion. He exhibits  tenderness (right shoulder, decreased ROM 2/2 pain. no obvious deformity. ).  Neurological: He is alert.  Decreased ability to flex at hip on left leg, normal strength to that point  Nursing note and vitals reviewed.   ED Course  Procedures (including critical care time) Labs Review Labs Reviewed  CBC WITH DIFFERENTIAL/PLATELET - Abnormal; Notable for the following:    RBC 3.89 (*)    Hemoglobin 11.9 (*)    HCT 36.5 (*)    All other components within normal limits  BASIC METABOLIC PANEL - Abnormal; Notable for the following:    Calcium 8.5 (*)    GFR calc non Af Amer 53 (*)    All other components within normal limits    Imaging Review Dg Chest 2 View  12/31/2015  CLINICAL DATA:  Right anterior rib pain  after fall today. Evaluate for rib fracture. EXAM: CHEST  2 VIEW COMPARISON:  11/28/2014 FINDINGS: Normal heart size. There is a known moderate hiatal hernia widening the lower mediastinal contours. There is no edema, consolidation, effusion, or pneumothorax. No visible fracture. Advanced right glenohumeral osteoarthritis with high humeral head. IMPRESSION: 1. No acute/ posttraumatic finding. 2. Moderate hiatal hernia. Electronically Signed   By: Monte Fantasia M.D.   On: 12/31/2015 19:33   Dg Shoulder Right  12/31/2015  CLINICAL DATA:  Fall, right shoulder pain EXAM: RIGHT SHOULDER - 2+ VIEW COMPARISON:  Right Shoulder same day FINDINGS: Two views of the right shoulder submitted. There is nondisplaced minimal impacted fracture of the right proximal humerus. There is cortical disruption greater humeral tuberosity. IMPRESSION: There is nondisplaced minimal impacted fracture of the right proximal humerus. There is cortical disruption greater humeral tuberosity. Electronically Signed   By: Lahoma Crocker M.D.   On: 12/31/2015 16:07   Dg Shoulder Right  12/31/2015  CLINICAL DATA:  Tripped and fell landing on right shoulder with resulting pain. EXAM: RIGHT SHOULDER - 2+ VIEW COMPARISON:  None.  FINDINGS: There are degenerative changes of the glenohumeral joint and normally over the [facility] joint. Possible cortical disruption over the greater tuberosity extending towards the humeral neck as cannot exclude a fracture in this location. No evidence of shoulder dislocation. IMPRESSION: Possible fracture involving the greater tuberosity extending towards the humeral neck. Consider internal and external rotation views. Electronically Signed   By: Marin Olp M.D.   On: 12/31/2015 15:27   Ct Head Wo Contrast  12/31/2015  CLINICAL DATA:  Meningioma.  Fell today. EXAM: CT HEAD WITHOUT CONTRAST TECHNIQUE: Contiguous axial images were obtained from the base of the skull through the vertex without intravenous contrast. COMPARISON:  MRI 11/09/2015 FINDINGS: Postop craniotomy for meningioma resection over the right convexity. Recurrent tumor best seen on the prior MRI with contrast. Right parietal white matter edema has progressed since the prior MRI. This could be due to actively growing tumor. Generalized atrophy. Negative for acute infarct. Negative for intracranial hemorrhage. Negative for skull fracture. IMPRESSION: No intracranial hemorrhage. Right parietal convexity meningioma best seen on prior MRI. Increase in white matter edema in the right parietal lobe suggesting active tumor growth. Tumor best evaluated on MRI with contrast. Electronically Signed   By: Franchot Gallo M.D.   On: 12/31/2015 15:41   I have personally reviewed and evaluated these images and lab results as part of my medical decision-making.   EKG Interpretation None      MDM   Final diagnoses:  Arm pain  Humerus fracture, right, closed, initial encounter   [place] today because of increasign foot draggin (happened before with meningioma but had improved), fell on shoulder with progressing pain since that time. Now with difficulty of ROM 2/2 pain, had good ROM initially. ttp in proximal humerus. No obvious deformities distal  neurovascular exam WNL.  Will ct head to ensure no complications or recurrent meningioma, if negative will likely need MRI but can be done as an outpatient. Will also xr shoulder, pain and nausea meds.  XR with humerus fracture. Patient ambulates at home with a cane and was not able to ambulate here 2/2 broken arm and decreased strength in right foot so was admitted for PT/OT/pain control as needed.       Merrily Pew, MD 01/01/16 [phone]

## 2015-12-31 NOTE — H&P (Signed)
[facility] History and Physical  GARION TEAHAN [id number] DOB: 1977/03/02 DOA: 12/31/2015  Referring physician: Lacretia Leigh, M.D. PCP:  Melinda Crutch, MD   Chief Complaint: [name].  HPI: QAADIR QUAM is a 82 y.o. male with a past medical history of prostate cancer, hyperlipidemia, restless syndrome, GERD, recurrent meningioma, seizures, obstructive sleep apnea, who comes to the emergency department with right shoulder pain after a fall this morning.  Per patient, he is having a recurrence of symptoms from his meningioma, which is usually affect him with left-sided weakness. He states that he has been having trouble lifting his LLE for ambulation and uses a cane with his right upper extremity. He is trying to walk this morning when his foot Caught up in the carpet and he felt on his right side radiating his right shoulder. He felt immediate pain, which has been progressively worse since then. An x-ray taken in the emergency department confirms nondisplaced minimal impacted fracture of the right proximal humerus.   Review of Systems:  Constitutional:  No weight loss, night sweats, Fevers, chills, fatigue.  HEENT:  No headaches, Difficulty swallowing,Tooth/dental problems,Sore throat,  No sneezing, itching, ear ache, nasal congestion, post nasal drip,  Cardio-vascular:  No chest pain, Orthopnea, PND, swelling in lower extremities, anasarca, dizziness, palpitations  GI:  No heartburn, indigestion, abdominal pain, nausea, vomiting, diarrhea, change in bowel habits, loss of appetite  Resp:  No shortness of breath with exertion or at rest. No excess mucus, no productive cough, No non-productive cough, No coughing up of blood.No change in color of mucus.No wheezing.No chest wall deformity  Skin:  no rash or lesions.  GU:  no dysuria, change in color of urine, no urgency or frequency. No flank pain.  Musculoskeletal:  Right shoulder pain as above mentioned. Psych:  No change in mood  or affect. No depression or anxiety. No memory loss.  Neuro:  left-sided weakness. Past Medical History  Diagnosis Date  . Anemia   . Prostate cancer ([place])   . Basal cell cancer   . Hyperlipidemia   . Restless leg   . GERD (gastroesophageal reflux disease)   . Brain tumor ([place])   . History of radiation therapy     back approximately 2010 prostate radiation under care of Dr. Valere Dross   . Hypertension   . Herniated disc     lumbar spine  . OSA (obstructive sleep apnea) 10/28/2014    Moderate with AHI 21/hr  . Meningioma ([place])     brain  . Hiatal hernia   . Occult blood in stools   . Nocturia   . Aortic insufficiency 08/11/2015  . Seizures [facility])    Past Surgical History  Procedure Laterality Date  . Joint replacement      right knee replacement  . Bilateral cateract surgery    . Vasectomy    . Colonoscopy      colon polyp removed  . Craniotomy Right 09/27/2013    Procedure: RIGHT FRONTAL CRANIOTOMY FOR TUMOR RESECTION ;  Surgeon: Consuella Lose, MD;  Location: [place] NEURO ORS;  Service: Neurosurgery;  Laterality: Right;  . Prostate biopsy    . Tonsillectomy      Agae 6-7  . Craniotomy Right 02/17/2015    Procedure: Right frontal parietal craniotomy for resection of meningioma with brainlab;  Surgeon: Consuella Lose, MD;  Location: [place] NEURO ORS;  Service: Neurosurgery;  Laterality: Right;  Right frontal parietal craniotomy for resection of meningioma with brainlab  . Application of cranial  navigation N/A 02/17/2015    Procedure: APPLICATION OF CRANIAL NAVIGATION;  Surgeon: Consuella Lose, MD;  Location: [place] NEURO ORS;  Service: Neurosurgery;  Laterality: N/A;  APPLICATION OF CRANIAL NAVIGATION   Social History:  reports that he has never smoked. He quit smokeless tobacco use about 42 years ago. His smokeless tobacco use included Chew. He reports that he does not drink alcohol or use illicit drugs.  Allergies  Allergen Reactions  . Asa [Aspirin] Nausea Only  . Other Itching  and Rash    Nuts  . Vicodin [Hydrocodone-Acetaminophen] Nausea Only    Family History  Problem Relation Age of Onset  . Parkinsonism Mother   . Dementia Father      Prior to Admission medications   Medication Sig Start Date End Date Taking? Authorizing Provider  acetaminophen (TYLENOL) 500 MG tablet Take 1,000 mg by mouth every 8 (eight) hours as needed (pain).   Yes Historical Provider, MD  b complex vitamins tablet Take 1 tablet by mouth daily.   Yes Historical Provider, MD  famotidine (PEPCID) 20 MG tablet Take 20 mg by mouth at bedtime.   Yes Historical Provider, MD  ferrous sulfate 325 (65 FE) MG tablet Take 1,300 mg by mouth every morning.    Yes Historical Provider, MD  lamoTRIgine (LAMICTAL) 25 MG tablet Take 1 tablet twice a day for 2 weeks, then increase to 2 tablets twice a day and continue 12/01/15  Yes Cameron Sprang, MD  levETIRAcetam (KEPPRA) 500 MG tablet Take 1 & 1/2 tablets twice a day 12/01/15  Yes Cameron Sprang, MD  magnesium gluconate (MAGONATE) 500 MG tablet Take 500 mg by mouth daily.   Yes Historical Provider, MD  multivitamin-lutein (OCUVITE-LUTEIN) CAPS capsule Take 1 capsule by mouth daily.   Yes Historical Provider, MD  Probiotic Product (PROBIOTIC DAILY PO) Take 1 capsule by mouth daily.    Yes Historical Provider, MD  rOPINIRole (REQUIP) 3 MG tablet Take 3 mg by mouth at bedtime. Pt takes 1 hour before bedtime 11/18/14  Yes Historical Provider, MD  solifenacin (VESICARE) 5 MG tablet Take 1 tablet (5 mg total) by mouth daily. 10/18/13  Yes Daniel J Angiulli, PA-C  tamsulosin (FLOMAX) 0.4 MG CAPS capsule Take 1 capsule (0.4 mg total) by mouth daily after supper. 10/18/13  Yes Daniel J Angiulli, PA-C  valsartan (DIOVAN) 160 MG tablet Take 160 mg by mouth daily.   Yes Historical Provider, MD  vitamin B-12 (CYANOCOBALAMIN) 100 MCG tablet Take 100 mcg by mouth daily.    Yes Historical Provider, MD  omeprazole (PRILOSEC) 20 MG capsule Take 1 capsule (20 mg total) by mouth  daily. Patient not taking: Reported on 12/31/2015 10/18/13   Lavon Paganini Angiulli, PA-C  oxyCODONE-acetaminophen (PERCOCET) 5-325 MG tablet Take 1 tablet by mouth every 8 (eight) hours as needed. 12/31/15   Merrily Pew, MD   Physical Exam: Filed Vitals:   12/31/15 1438 12/31/15 1442 12/31/15 1844 12/31/15 2140  BP:  148/70 139/73 134/66  Pulse:  68 74 70  Temp:  98.4 F (36.9 C) 98.5 F (36.9 C) 98.2 F (36.8 C)  TempSrc:  Oral Oral Oral  Resp:  16 18 16   SpO2: 98% 97% 96% 90%    Wt Readings from Last 3 Encounters:  12/01/15 90.266 kg (199 lb)  10/30/15 90.901 kg (200 lb 6.4 oz)  10/27/15 89.812 kg (198 lb)    General:  Appears calm and comfortable Eyes: PERRL, normal lids, irises & conjunctiva ENT: grossly normal hearing,  lips & tongue Neck: no LAD, masses or thyromegaly Cardiovascular: RRR, no m/r/g. No LE edema. Telemetry: SR, no arrhythmias  Respiratory: CTA bilaterally, no w/r/r. Normal respiratory effort. Abdomen: soft, ntnd Skin: no rash or induration seen on limited exam Musculoskeletal: Right sling in place. Right shoulder tenderness. Distal pulses are palpable. Psychiatric: grossly normal mood and affect, speech fluent and appropriate Neurologic: Awake, alert, oriented 4, 3.5/5 left-sided weakness (patient's baseline).           Labs on Admission:  Basic Metabolic Panel:  Recent Labs Lab 12/31/15 2048  NA 138  K 3.9  CL 104  CO2 27  GLUCOSE 95  BUN 11  CREATININE 1.22  CALCIUM 8.5*   CBC:  Recent Labs Lab 12/31/15 2048  WBC 8.7  NEUTROABS 6.4  HGB 11.9*  HCT 36.5*  MCV 93.8  PLT 277    Radiological Exams on Admission: Dg Chest 2 View  12/31/2015  CLINICAL DATA:  Right anterior rib pain after fall today. Evaluate for rib fracture. EXAM: CHEST  2 VIEW COMPARISON:  11/28/2014 FINDINGS: Normal heart size. There is a known moderate hiatal hernia widening the lower mediastinal contours. There is no edema, consolidation, effusion, or pneumothorax.  No visible fracture. Advanced right glenohumeral osteoarthritis with high humeral head. IMPRESSION: 1. No acute/ posttraumatic finding. 2. Moderate hiatal hernia. Electronically Signed   By: Monte Fantasia M.D.   On: 12/31/2015 19:33   Dg Shoulder Right  12/31/2015  CLINICAL DATA:  Fall, right shoulder pain EXAM: RIGHT SHOULDER - 2+ VIEW COMPARISON:  Right Shoulder same day FINDINGS: Two views of the right shoulder submitted. There is nondisplaced minimal impacted fracture of the right proximal humerus. There is cortical disruption greater humeral tuberosity. IMPRESSION: There is nondisplaced minimal impacted fracture of the right proximal humerus. There is cortical disruption greater humeral tuberosity. Electronically Signed   By: Lahoma Crocker M.D.   On: 12/31/2015 16:07   Dg Shoulder Right  12/31/2015  CLINICAL DATA:  Tripped and fell landing on right shoulder with resulting pain. EXAM: RIGHT SHOULDER - 2+ VIEW COMPARISON:  None. FINDINGS: There are degenerative changes of the glenohumeral joint and normally over the [facility] joint. Possible cortical disruption over the greater tuberosity extending towards the humeral neck as cannot exclude a fracture in this location. No evidence of shoulder dislocation. IMPRESSION: Possible fracture involving the greater tuberosity extending towards the humeral neck. Consider internal and external rotation views. Electronically Signed   By: Marin Olp M.D.   On: 12/31/2015 15:27   Ct Head Wo Contrast  12/31/2015  CLINICAL DATA:  Meningioma.  Fell today. EXAM: CT HEAD WITHOUT CONTRAST TECHNIQUE: Contiguous axial images were obtained from the base of the skull through the vertex without intravenous contrast. COMPARISON:  MRI 11/09/2015 FINDINGS: Postop craniotomy for meningioma resection over the right convexity. Recurrent tumor best seen on the prior MRI with contrast. Right parietal white matter edema has progressed since the prior MRI. This could be due to actively growing  tumor. Generalized atrophy. Negative for acute infarct. Negative for intracranial hemorrhage. Negative for skull fracture. IMPRESSION: No intracranial hemorrhage. Right parietal convexity meningioma best seen on prior MRI. Increase in white matter edema in the right parietal lobe suggesting active tumor growth. Tumor best evaluated on MRI with contrast. Electronically Signed   By: Franchot Gallo M.D.   On: 12/31/2015 15:41     Assessment/Plan Principal Problem:   Closed right humeral fracture Admit to MedSurg/observation. Continue immobilization. Analgesics as needed. Social services and  case management evaluation in a.m. Outpatient follow-up by orthopedic surgery.  Active Problems:   Hypertension Continue valsartan 160 mg by mouth daily. Monitor blood pressure periodically.    GERD (gastroesophageal reflux disease) Continue proton pump inhibitor.    Restless leg syndrome Continue Requip 3 mg by mouth at bedtime.    Anemia Monitor H&H.    Meningioma, recurrent of brain [facility]) Follow-up with neurology/neurosurgery as scheduled.     Code Status: Full code. DVT Prophylaxis: Lovenox SQ. Family Communication:  Disposition Plan: Admit for pain control and social services evaluation in a.m.  Time spent: Over 70 minutes were spent in the process of this admission.  Reubin Milan, M.D.   [facility] Pager [phone].

## 2015-12-31 NOTE — ED Notes (Signed)
Bed: [facility] Expected date:  Expected time:  Means of arrival:  Comments: EMS- 82yo M, fall/shoulder pain

## 2015-12-31 NOTE — ED Notes (Signed)
Admitting MD at bedside.

## 2015-12-31 NOTE — ED Notes (Signed)
MD at bedside. 

## 2015-12-31 NOTE — ED Notes (Signed)
Pt. Was unable to ambulate in [place] due to pain & weakness.

## 2015-12-31 NOTE — ED Notes (Signed)
Pt tripped over toes of shoes this am and fell on R shoulder. No LOC, did not hit head. Pt reports shoulder felt OK initially after fall, but pain has gradually gotten worse. No obvious deformities. Does see an orthopedic for this shoulder though.

## 2016-01-01 ENCOUNTER — Observation Stay (HOSPITAL_COMMUNITY): Payer: Medicare Other

## 2016-01-01 DIAGNOSIS — D320 Benign neoplasm of cerebral meninges: Secondary | ICD-10-CM | POA: Diagnosis not present

## 2016-01-01 DIAGNOSIS — S42301A Unspecified fracture of shaft of humerus, right arm, initial encounter for closed fracture: Secondary | ICD-10-CM | POA: Diagnosis not present

## 2016-01-01 MED ORDER — VITAMIN B-12 100 MCG PO TABS
100.0000 ug | ORAL_TABLET | Freq: Every day | ORAL | Status: DC
  Administered 2016-01-01 – 2016-01-02 (×2): 100 ug via ORAL
  Filled 2016-01-01 (×2): qty 1

## 2016-01-01 MED ORDER — PANTOPRAZOLE SODIUM 40 MG PO TBEC
40.0000 mg | DELAYED_RELEASE_TABLET | Freq: Every day | ORAL | Status: DC
  Administered 2016-01-01 – 2016-01-02 (×2): 40 mg via ORAL
  Filled 2016-01-01 (×2): qty 1

## 2016-01-01 MED ORDER — FAMOTIDINE 20 MG PO TABS
20.0000 mg | ORAL_TABLET | Freq: Every day | ORAL | Status: DC
  Administered 2016-01-01: 20 mg via ORAL
  Filled 2016-01-01 (×3): qty 1

## 2016-01-01 MED ORDER — GADOBENATE DIMEGLUMINE 529 MG/ML IV SOLN
19.0000 mL/kg | Freq: Once | INTRAVENOUS | Status: AC | PRN
  Administered 2016-01-01: 19 mL via INTRAVENOUS

## 2016-01-01 MED ORDER — ROPINIROLE HCL 1 MG PO TABS
3.0000 mg | ORAL_TABLET | Freq: Every day | ORAL | Status: DC
  Administered 2016-01-01: 3 mg via ORAL
  Filled 2016-01-01 (×2): qty 3

## 2016-01-01 MED ORDER — LEVETIRACETAM 750 MG PO TABS
750.0000 mg | ORAL_TABLET | Freq: Once | ORAL | Status: AC
  Administered 2016-01-01: 750 mg via ORAL
  Filled 2016-01-01: qty 1

## 2016-01-01 MED ORDER — DARIFENACIN HYDROBROMIDE ER 7.5 MG PO TB24
7.5000 mg | ORAL_TABLET | Freq: Every day | ORAL | Status: DC
  Administered 2016-01-01 – 2016-01-02 (×2): 7.5 mg via ORAL
  Filled 2016-01-01 (×2): qty 1

## 2016-01-01 MED ORDER — ENOXAPARIN SODIUM 40 MG/0.4ML ~~LOC~~ SOLN
40.0000 mg | SUBCUTANEOUS | Status: DC
  Administered 2016-01-01 – 2016-01-02 (×2): 40 mg via SUBCUTANEOUS
  Filled 2016-01-01 (×2): qty 0.4

## 2016-01-01 MED ORDER — IRBESARTAN 150 MG PO TABS
150.0000 mg | ORAL_TABLET | Freq: Every day | ORAL | Status: DC
  Administered 2016-01-01: 150 mg via ORAL
  Filled 2016-01-01 (×2): qty 1

## 2016-01-01 MED ORDER — DEXAMETHASONE 4 MG PO TABS
4.0000 mg | ORAL_TABLET | Freq: Three times a day (TID) | ORAL | Status: DC
  Administered 2016-01-01 – 2016-01-02 (×3): 4 mg via ORAL
  Filled 2016-01-01 (×6): qty 1

## 2016-01-01 MED ORDER — FA-PYRIDOXINE-CYANOCOBALAMIN 2.5-25-2 MG PO TABS
1.0000 | ORAL_TABLET | Freq: Every day | ORAL | Status: DC
  Administered 2016-01-01 – 2016-01-02 (×2): 1 via ORAL
  Filled 2016-01-01 (×2): qty 1

## 2016-01-01 MED ORDER — MAGNESIUM GLUCONATE 500 MG PO TABS
500.0000 mg | ORAL_TABLET | Freq: Every day | ORAL | Status: DC
Start: 2016-01-01 — End: 2016-01-02
  Administered 2016-01-01 – 2016-01-02 (×2): 500 mg via ORAL
  Filled 2016-01-01 (×2): qty 1

## 2016-01-01 MED ORDER — LEVETIRACETAM 750 MG PO TABS
750.0000 mg | ORAL_TABLET | Freq: Two times a day (BID) | ORAL | Status: DC
  Administered 2016-01-01: 750 mg via ORAL
  Filled 2016-01-01 (×3): qty 1

## 2016-01-01 MED ORDER — OXYCODONE HCL 5 MG PO TABS
5.0000 mg | ORAL_TABLET | ORAL | Status: DC | PRN
  Administered 2016-01-01 – 2016-01-02 (×3): 5 mg via ORAL
  Filled 2016-01-01 (×3): qty 1

## 2016-01-01 MED ORDER — ONDANSETRON HCL 4 MG PO TABS: 4.0000 mg | ORAL_TABLET | Freq: Four times a day (QID) | ORAL | Status: DC | PRN

## 2016-01-01 MED ORDER — MORPHINE SULFATE (PF) 2 MG/ML IV SOLN: 1.0000 mg | INTRAVENOUS | Status: DC | PRN

## 2016-01-01 MED ORDER — OCUVITE PO TABS
1.0000 | ORAL_TABLET | Freq: Every day | ORAL | Status: DC
  Administered 2016-01-01 – 2016-01-02 (×2): 1 via ORAL
  Filled 2016-01-01 (×2): qty 1

## 2016-01-01 MED ORDER — TAMSULOSIN HCL 0.4 MG PO CAPS
0.4000 mg | ORAL_CAPSULE | Freq: Every day | ORAL | Status: DC
  Administered 2016-01-01: 0.4 mg via ORAL
  Filled 2016-01-01 (×2): qty 1

## 2016-01-01 MED ORDER — FERROUS SULFATE 325 (65 FE) MG PO TABS
325.0000 mg | ORAL_TABLET | Freq: Three times a day (TID) | ORAL | Status: DC
  Administered 2016-01-01 – 2016-01-02 (×5): 325 mg via ORAL
  Filled 2016-01-01 (×9): qty 1

## 2016-01-01 MED ORDER — LEVETIRACETAM 750 MG PO TABS
750.0000 mg | ORAL_TABLET | Freq: Two times a day (BID) | ORAL | Status: DC
  Administered 2016-01-01 – 2016-01-02 (×2): 750 mg via ORAL
  Filled 2016-01-01 (×4): qty 1

## 2016-01-01 MED ORDER — LAMOTRIGINE 25 MG PO TABS
50.0000 mg | ORAL_TABLET | Freq: Two times a day (BID) | ORAL | Status: DC
  Administered 2016-01-01 – 2016-01-02 (×4): 50 mg via ORAL
  Filled 2016-01-01 (×6): qty 2

## 2016-01-01 MED ORDER — ONDANSETRON HCL 4 MG/2ML IJ SOLN: 4.0000 mg | Freq: Four times a day (QID) | INTRAMUSCULAR | Status: DC | PRN

## 2016-01-01 MED ORDER — ACETAMINOPHEN 500 MG PO TABS: 1000.0000 mg | ORAL_TABLET | Freq: Three times a day (TID) | ORAL | Status: DC | PRN

## 2016-01-01 MED ORDER — FERROUS SULFATE 325 (65 FE) MG PO TABS
1300.0000 mg | ORAL_TABLET | Freq: Three times a day (TID) | ORAL | Status: DC
  Filled 2016-01-01 (×5): qty 4

## 2016-01-01 NOTE — Progress Notes (Signed)
PT Cancellation Note  Patient Details Name: SIDH LENOX MRN: [id number] DOB: 25-Jan-1931   Cancelled Treatment:    Reason Eval/Treat Not Completed: Other (comment)Patient getting ready to go down for MRI.  Noted not yet seen by orthopedics and is without weight bearing status for R UE.  Will follow up.   Reginia Naas 01/01/2016, 12:46 PM  Magda Kiel, [place] 01/01/2016

## 2016-01-01 NOTE — Evaluation (Signed)
Occupational Therapy Evaluation Patient Details Name: ZERIK HARBAUGH MRN: [id number] DOB: October 24, 1930 Today's Date: 01/01/2016    History of Present Illness pt was admitted after a fall resulting in R minimally displaced impacted fx of proximal humerus.  Pt has a PMH significant for prostate CA, recurrent meningioma, and seizure.  He has L sided weakness   Clinical Impression   This 82 year old man was admitted for the above.  He will benefit from continued OT to increase safety and independence with adls and mobility related to adls.  Pt needs up to total A +2 for adls.  Selected goals are for min to mod A in acute setting.  He will need follow up care at SNF as wife will not be able to safely assist him at home    Follow Up Recommendations  SNF    Equipment Recommendations   (to be further assessed)    Recommendations for Other Services       Precautions / Restrictions Precautions Precautions: Fall Precaution Comments: L sided weakness.  Sling/immobilizer on R Restrictions Weight Bearing Restrictions:  (NWB on RUE (has prox humerus fx))      Mobility Bed Mobility Overal bed mobility: Needs Assistance;+2 for physical assistance Bed Mobility: Supine to Sit;Sit to Supine     Supine to sit: Max assist;HOB elevated Sit to supine: Max assist;+2 for physical assistance   General bed mobility comments: assist for trunk and legs  Transfers Overall transfer level: Needs assistance Equipment used: 1 person hand held assist (in front of pt) Transfers: Sit to/from Stand Sit to Stand: Mod assist;From elevated surface         General transfer comment: assist to rise and stabilize.  Pt held to therapist     Balance Overall balance assessment: History of Falls;Needs assistance Sitting-balance support: Feet supported Sitting balance-Leahy Scale: Poor Sitting balance - Comments: one LOB posteriorly sitting EOB   Standing balance support: Single extremity supported;During  functional activity Standing balance-Leahy Scale: Poor                              ADL Overall ADL's : Needs assistance/impaired Eating/Feeding: Moderate assistance;Sitting   Grooming: Moderate assistance;Sitting   Upper Body Bathing: Maximal assistance;Sitting   Lower Body Bathing: Maximal assistance;+2 for physical assistance;Sit to/from stand   Upper Body Dressing : Maximal assistance;Sitting   Lower Body Dressing: Total assistance;+2 for physical assistance;Sit to/from stand                 General ADL Comments: NT assisted OT with bathing pt.  He has LUE weakness and reports difficulty with utensils.  RUE is immobilized.  Pt able to stand with +2 assistance but unable to sidestep.  Pt lost balance posteriorly one time when sitting EOB     Vision     Perception     Praxis      Pertinent Vitals/Pain Pain Assessment: 0-10 Pain Score:  (3-10) Pain Location: R shoulder Pain Descriptors / Indicators: Aching Pain Intervention(s): Limited activity within patient's tolerance;Monitored during session;Premedicated before session;Repositioned;Ice applied     Hand Dominance Right   Extremity/Trunk Assessment Upper Extremity Assessment Upper Extremity Assessment: RUE deficits/detail;LUE deficits/detail RUE Deficits / Details: immobilized LUE Deficits / Details: 3/5 strength; difficulty holding utensils  Per orders, pt may do pendulums.  He will not be able to tolerate this from a pain perspective and he doesn't have balance to be able to perform  Lower Extremity Assessment Lower Extremity Assessment: LLE deficits/detail:  Weakness.  Pt able to slight lift LLE from bed and from floor when sitting.         Communication Communication Communication: No difficulties   Cognition Arousal/Alertness: Awake/alert Behavior During Therapy: WFL for tasks assessed/performed Overall Cognitive Status: Within Functional Limits for tasks assessed                      General Comments       Exercises       Shoulder Instructions      Home Living Family/patient expects to be discharged to:: Skilled nursing facility                                 Additional Comments: from home with wife      Prior Functioning/Environment Level of Independence: Independent with assistive device(s)        Comments: used cane in R hand prior to fall    OT Diagnosis: Acute pain;Generalized weakness   OT Problem List: Decreased strength;Decreased activity tolerance;Impaired balance (sitting and/or standing);Pain;Decreased coordination;Decreased knowledge of use of DME or AE   OT Treatment/Interventions: Self-care/ADL training;DME and/or AE instruction;Balance training;Patient/family education;Therapeutic activities    OT Goals(Current goals can be found in the care plan section) Acute Rehab OT Goals Patient Stated Goal: decreased pain OT Goal Formulation: With patient Time For Goal Achievement: 01/08/16 Potential to Achieve Goals: Poor ADL Goals Pt Will Perform Eating: with set-up;with adaptive utensils;sitting;bed level Pt Will Perform Grooming: with min assist;with adaptive equipment;bed level;sitting Pt Will Perform Upper Body Bathing: with min assist;sitting Pt Will Perform Lower Body Bathing: with mod assist;sit to/from stand Additional ADL Goal #1: pt will go from sit to stand with min A and maintain for 2 minutes with min A for adls Additional ADL Goal #2: pt will perform bed mobility with min A in preparation for adls  OT Frequency: Min 2X/week   Barriers to D/C:            Co-evaluation              End of Session    Activity Tolerance: Patient limited by pain Patient left: in bed;with call bell/phone within reach;with bed alarm set;with nursing/sitter in room   Time: 1433-1455 OT Time Calculation (min): 22 min Charges:  OT General Charges $OT Visit: 1 Procedure OT Evaluation $OT Eval Moderate Complexity:  1 Procedure G-Codes: OT G-codes **NOT FOR INPATIENT CLASS** Functional Assessment Tool Used: clinical observation and judgment Functional Limitation: Self care Self Care Current Status [id number]): At least 80 percent but less than 100 percent impaired, limited or restricted Self Care Goal Status [id number]): At least 40 percent but less than 60 percent impaired, limited or restricted  Megyn Leng 01/01/2016, 3:22 PM Lesle Chris, OTR/L [phone] 01/01/2016

## 2016-01-01 NOTE — Progress Notes (Signed)
CSW consulted to assist with d/c planning. PT / OT recommendations pending. Pt has [employer] which requires prior authorization for SNF placement, if needed. CSW will meet with pt once recommendations are available.  Werner Lean LCSW [phone]

## 2016-01-01 NOTE — Progress Notes (Signed)
[facility] PROGRESS NOTE  [name] [id number] DOB: 23-Feb-1931 DOA: 12/31/2015 PCP:  Melinda Crutch, MD  Assessment/Plan: Closed right humeral fracture Continue immobilization. Analgesics as needed. Discussed case with Dr Elmyra Ricks, he recommend patient to use sling for fracture, OT for pendulum exercise.    Hypertension Continue valsartan 160 mg by mouth daily. Monitor blood pressure periodically.   GERD (gastroesophageal reflux disease) Continue proton pump inhibitor.   Restless leg syndrome Continue Requip 3 mg by mouth at bedtime.   Anemia Monitor H&H.   Meningioma, recurrent of brain [facility]) Discussed with Dr Tammi Klippel, start decadron TID and will order MRI. Patient to follow in the office on Monday    Code Status: full code.  Family Communication: wife at bedside.  Disposition Plan: await PT, OT evaluation    Consultants:  Phone ortho  Dr Tammi Klippel   Procedures:  none  Antibiotics:  none  HPI/Subjective: Patient report shoulder pain better. Still required pain medications. He has steps in his house.  He has been having trouble with left foot weakness, dragging foot and left arm weakness  Objective: Filed Vitals:   01/01/16 0530 01/01/16 0913  BP: 127/61 155/57  Pulse: 70 83  Temp: 97.9 F (36.6 C)   Resp: 16     Intake/Output Summary (Last 24 hours) at 01/01/16 0933 Last data filed at 01/01/16 0830  Gross per 24 hour  Intake    480 ml  Output    600 ml  Net   -120 ml   Filed Weights   01/01/16 0500  Weight: 90.719 kg (200 lb)    Exam:   General:  Alert in no acute distress  Cardiovascular: S 1, S 2 RRR  Respiratory: CTA  Abdomen: BS present, soft, nt  Musculoskeletal: no edema   Data Reviewed: Basic Metabolic Panel:  Recent Labs Lab 12/31/15 2048  NA 138  K 3.9  CL 104  CO2 27  GLUCOSE 95  BUN 11  CREATININE 1.22  CALCIUM 8.5*   Liver Function Tests: No results for input(s): AST, ALT, ALKPHOS, BILITOT,  PROT, ALBUMIN in the last 168 hours. No results for input(s): LIPASE, AMYLASE in the last 168 hours. No results for input(s): AMMONIA in the last 168 hours. CBC:  Recent Labs Lab 12/31/15 2048  WBC 8.7  NEUTROABS 6.4  HGB 11.9*  HCT 36.5*  MCV 93.8  PLT 277   Cardiac Enzymes: No results for input(s): CKTOTAL, CKMB, CKMBINDEX, TROPONINI in the last 168 hours. BNP (last 3 results) No results for input(s): BNP in the last 8760 hours.  ProBNP (last 3 results) No results for input(s): PROBNP in the last 8760 hours.  CBG: No results for input(s): GLUCAP in the last 168 hours.  No results found for this or any previous visit (from the past 240 hour(s)).   Studies: Dg Chest 2 View  12/31/2015  CLINICAL DATA:  Right anterior rib pain after fall today. Evaluate for rib fracture. EXAM: CHEST  2 VIEW COMPARISON:  11/28/2014 FINDINGS: Normal heart size. There is a known moderate hiatal hernia widening the lower mediastinal contours. There is no edema, consolidation, effusion, or pneumothorax. No visible fracture. Advanced right glenohumeral osteoarthritis with high humeral head. IMPRESSION: 1. No acute/ posttraumatic finding. 2. Moderate hiatal hernia. Electronically Signed   By: Monte Fantasia M.D.   On: 12/31/2015 19:33   Dg Shoulder Right  12/31/2015  CLINICAL DATA:  Fall, right shoulder pain EXAM: RIGHT SHOULDER - 2+ VIEW COMPARISON:  Right Shoulder same day  FINDINGS: Two views of the right shoulder submitted. There is nondisplaced minimal impacted fracture of the right proximal humerus. There is cortical disruption greater humeral tuberosity. IMPRESSION: There is nondisplaced minimal impacted fracture of the right proximal humerus. There is cortical disruption greater humeral tuberosity. Electronically Signed   By: Lahoma Crocker M.D.   On: 12/31/2015 16:07   Dg Shoulder Right  12/31/2015  CLINICAL DATA:  Tripped and fell landing on right shoulder with resulting pain. EXAM: RIGHT SHOULDER -  2+ VIEW COMPARISON:  None. FINDINGS: There are degenerative changes of the glenohumeral joint and normally over the [facility] joint. Possible cortical disruption over the greater tuberosity extending towards the humeral neck as cannot exclude a fracture in this location. No evidence of shoulder dislocation. IMPRESSION: Possible fracture involving the greater tuberosity extending towards the humeral neck. Consider internal and external rotation views. Electronically Signed   By: Marin Olp M.D.   On: 12/31/2015 15:27   Ct Head Wo Contrast  12/31/2015  CLINICAL DATA:  Meningioma.  Fell today. EXAM: CT HEAD WITHOUT CONTRAST TECHNIQUE: Contiguous axial images were obtained from the base of the skull through the vertex without intravenous contrast. COMPARISON:  MRI 11/09/2015 FINDINGS: Postop craniotomy for meningioma resection over the right convexity. Recurrent tumor best seen on the prior MRI with contrast. Right parietal white matter edema has progressed since the prior MRI. This could be due to actively growing tumor. Generalized atrophy. Negative for acute infarct. Negative for intracranial hemorrhage. Negative for skull fracture. IMPRESSION: No intracranial hemorrhage. Right parietal convexity meningioma best seen on prior MRI. Increase in white matter edema in the right parietal lobe suggesting active tumor growth. Tumor best evaluated on MRI with contrast. Electronically Signed   By: Franchot Gallo M.D.   On: 12/31/2015 15:41    Scheduled Meds: . beta carotene w/minerals  1 tablet Oral Daily  . darifenacin  7.5 mg Oral Daily  . enoxaparin (LOVENOX) injection  40 mg Subcutaneous Q24H  . famotidine  20 mg Oral QHS  . ferrous sulfate  325 mg Oral TID AC & HS  . folic acid-pyridoxine-cyancobalamin  1 tablet Oral Daily  . irbesartan  150 mg Oral Daily  . lamoTRIgine  50 mg Oral BID  . levETIRAcetam  750 mg Oral Q12H  . magnesium gluconate  500 mg Oral Daily  . oxyCODONE-acetaminophen  1 tablet Oral Once   . pantoprazole  40 mg Oral Daily  . rOPINIRole  3 mg Oral q1800  . tamsulosin  0.4 mg Oral QPC supper  . vitamin B-12  100 mcg Oral Daily   Continuous Infusions:   Principal Problem:   Closed right humeral fracture Active Problems:   Hypertension   GERD (gastroesophageal reflux disease)   Restless leg syndrome   Anemia   Meningioma, recurrent of brain ([place])    Time spent: 35 minutes.     Niel Hummer A  [facility] Pager [phone] If 7PM-7AM, please contact night-coverage at [email or web address], password [facility] 01/01/2016, 9:33 AM

## 2016-01-02 DIAGNOSIS — S42301A Unspecified fracture of shaft of humerus, right arm, initial encounter for closed fracture: Secondary | ICD-10-CM | POA: Diagnosis not present

## 2016-01-02 DIAGNOSIS — D320 Benign neoplasm of cerebral meninges: Secondary | ICD-10-CM | POA: Diagnosis not present

## 2016-01-02 MED ORDER — OXYCODONE HCL 5 MG PO TABS: 5.0000 mg | ORAL_TABLET | ORAL | Status: DC | PRN

## 2016-01-02 MED ORDER — DEXAMETHASONE 4 MG PO TABS: 4.0000 mg | ORAL_TABLET | Freq: Three times a day (TID) | ORAL | Status: DC

## 2016-01-02 NOTE — NC FL2 (Signed)
[place] MEDICAID FL2 LEVEL OF CARE SCREENING TOOL     IDENTIFICATION  Patient Name: [name] Birthdate: 08/13/1931 Sex: male Admission Date (Current Location): 12/31/2015  [facility] and [place] Number:  Herbalist and Address:  [facility],  [place] [address] [place]      Provider Number: [id number]  Attending Physician Name and Address:  Elmarie Shiley, MD  Relative Name and Phone Number:       Current Level of Care: Hospital Recommended Level of Care: [place] Prior Approval Number:    Date Approved/Denied:   PASRR Number: [id number]  Discharge Plan: Home    Current Diagnoses: Patient Active Problem List   Diagnosis Date Noted  . Humerus fracture 12/31/2015  . Closed right humeral fracture 12/31/2015  . Aortic insufficiency 08/11/2015  . 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 Atypical meningioma of brain ([place]) 02/17/2015  . Obesity (BMI 30-39.9) 02/13/2015  . OSA (obstructive sleep apnea) 10/28/2014  . SOB (shortness of breath) 07/31/2014  . Fatigue due to excessive exertion 07/31/2014  . Edema extremities 07/31/2014  . Hypersomnia 07/31/2014  . Meningioma, recurrent of brain ([place]) 09/27/2013  . Unspecified constipation 09/26/2013  . Anemia 09/26/2013  . Dizziness 09/20/2013  . Fall at home 09/20/2013  . Dehydration 09/20/2013  . Orthostasis 09/20/2013  . CKD (chronic kidney disease), stage III 09/20/2013  . Brain mass 09/20/2013  . Gait instability 08/20/2013  . Hypokalemia 10/09/2012  . Ileus ([place]) 10/08/2012  . Acute kidney injury ([place]) 10/08/2012  . Hypertension 10/08/2012  . Hyperlipidemia 10/08/2012  . GERD (gastroesophageal reflux disease) 10/08/2012  . Restless leg syndrome 10/08/2012    Orientation RESPIRATION BLADDER Height & Weight     Self, Time,  Situation, Place  Normal Continent Weight: 200 lb (90.719 kg) Height:  5\' 7"  (170.2 cm)  BEHAVIORAL SYMPTOMS/MOOD NEUROLOGICAL BOWEL NUTRITION STATUS      Continent Diet (Heart Healthy)  AMBULATORY STATUS COMMUNICATION OF NEEDS Skin   Extensive Assist Verbally Normal                       Personal Care Assistance Level of Assistance  Bathing, Dressing Bathing Assistance: Limited assistance   Dressing Assistance: Limited assistance     Functional Limitations Info             SPECIAL CARE FACTORS FREQUENCY  PT (By licensed PT), OT (By licensed OT)     PT Frequency: 5 OT Frequency: 5            Contractures      Additional Factors Info  Code Status, Allergies Code Status Info: Fullcode Allergies Info: Asa, Vicodin           Current Medications (01/02/2016):  This is the current hospital active medication list Current Facility-Administered Medications  Medication Dose Route Frequency Provider Last Rate Last Dose  . acetaminophen (TYLENOL) tablet 1,000 mg  1,000 mg Oral Q8H PRN Reubin Milan, MD      . beta carotene w/minerals (OCUVITE) tablet 1 tablet  1 tablet Oral Daily Reubin Milan, MD   1 tablet at 01/02/16 0945  . darifenacin (ENABLEX) 24 hr tablet 7.5 mg  7.5 mg Oral Daily Reubin Milan, MD   7.5 mg at 01/02/16 0945  . dexamethasone (DECADRON) tablet 4 mg  4 mg Oral 3 times per day Belkys A Regalado,  MD   4 mg at 01/02/16 0603  . enoxaparin (LOVENOX) injection 40 mg  40 mg Subcutaneous Q24H Reubin Milan, MD   40 mg at 01/02/16 0944  . famotidine (PEPCID) tablet 20 mg  20 mg Oral QHS Reubin Milan, MD   20 mg at 01/01/16 2155  . ferrous sulfate tablet 325 mg  325 mg Oral TID AC & HS Reubin Milan, MD   325 mg at 01/02/16 0945  . folic acid-pyridoxine-cyancobalamin (FOLTX) 2.5-25-2 MG per tablet 1 tablet  1 tablet Oral Daily Reubin Milan, MD   1 tablet at 01/02/16 0945  . irbesartan (AVAPRO) tablet 150 mg  150 mg Oral  Daily Reubin Milan, MD   150 mg at 01/01/16 0913  . lamoTRIgine (LAMICTAL) tablet 50 mg  50 mg Oral BID Reubin Milan, MD   50 mg at 01/02/16 0601  . levETIRAcetam (KEPPRA) tablet 750 mg  750 mg Oral Q12H Belkys A Regalado, MD   750 mg at 01/02/16 0601  . magnesium gluconate (MAGONATE) tablet 500 mg  500 mg Oral Daily Reubin Milan, MD   500 mg at 01/02/16 0945  . morphine 2 MG/ML injection 1 mg  1 mg Intravenous Q4H PRN Belkys A Regalado, MD      . ondansetron (ZOFRAN) tablet 4 mg  4 mg Oral Q6H PRN Reubin Milan, MD       Or  . ondansetron [facility]) injection 4 mg  4 mg Intravenous Q6H PRN Reubin Milan, MD      . oxyCODONE (Oxy IR/ROXICODONE) immediate release tablet 5 mg  5 mg Oral Q4H PRN Reubin Milan, MD   5 mg at 01/02/16 0945  . oxyCODONE-acetaminophen (PERCOCET/ROXICET) 5-325 MG per tablet 1 tablet  1 tablet Oral Once Merrily Pew, MD   1 tablet at 12/31/15 2045  . pantoprazole (PROTONIX) EC tablet 40 mg  40 mg Oral Daily Reubin Milan, MD   40 mg at 01/02/16 0945  . rOPINIRole (REQUIP) tablet 3 mg  3 mg Oral q1800 Belkys A Regalado, MD   3 mg at 01/01/16 1809  . tamsulosin (FLOMAX) capsule 0.4 mg  0.4 mg Oral QPC supper Reubin Milan, MD   0.4 mg at 01/01/16 1809  . vitamin B-12 (CYANOCOBALAMIN) tablet 100 mcg  100 mcg Oral Daily Reubin Milan, MD   100 mcg at 01/02/16 0945     Discharge Medications: Please see discharge summary for a list of discharge medications.  Relevant Imaging Results:  Relevant Lab Results:   Additional Information SSN: [id number]  Standley Brooking, LCSW

## 2016-01-02 NOTE — Care Management Note (Signed)
Case Management Note  Patient Details  Name: [name] MRN: [id number] Date of Birth: 1931-03-11  Subjective/Objective:     Closed right humeral fracture               Action/Plan: Discharge Planning:   10:00 am NCM spoke to pt and wife, [name] at bedside. Pt is in agreement for SNF-rehab. States he has RW, Radio producer and bedside commode at home. NCM contacted CSW for referral to SNF.   Expected Discharge Date:  01/02/2016               Expected Discharge Plan:  Skilled Nursing Facility  In-House Referral:  Clinical Social Work  Discharge planning Services  CM Consult  Post Acute Care Choice:  NA Choice offered to:  NA  DME Arranged:  N/A DME Agency:  NA  HH Arranged:  NA HH Agency:  NA  Status of Service:  Completed, signed off  Medicare Important Message Given:    Date Medicare IM Given:    Medicare IM give by:    Date Additional Medicare IM Given:    Additional Medicare Important Message give by:     If discussed at [place] of Stay Meetings, dates discussed:    Additional Comments:  Erenest Rasher, RN 01/02/2016, 5:45 PM

## 2016-01-02 NOTE — Clinical Social Work Placement (Signed)
CSW received consult from [facility], [place] that patient will need SNF at discharge - patient is here under observation & [employer]. CSW got approval from [place] for Letter of Guarantee, pending insurance authorization. CSW confirmed with Haze Boyden at [facility] that they would be able to accept patient with LOG.   Patient is set to discharge to [facility] today. Patient & wife, Mardene Celeste made aware. Discharge packet given to RN, Amy. [facility] called for transport.     Raynaldo Opitz, [facility] Clinical Social Worker cell #: [phone]    CLINICAL SOCIAL WORK PLACEMENT  NOTE  Date:  01/02/2016  Patient Details  Name: [name] MRN: [id number] Date of Birth: [birth year]  Clinical Social Work is seeking post-discharge placement for this patient at the [place] level of care (*CSW will initial, date and re-position this form in  chart as items are completed):  Yes   Patient/family provided with [place] Work Department's list of facilities offering this level of care within the geographic area requested by the patient (or if unable, by the patient's family).  Yes   Patient/family informed of their freedom to choose among providers that offer the needed level of care, that participate in Medicare, Medicaid or managed care program needed by the patient, have an available bed and are willing to accept the patient.  Yes   Patient/family informed of [facility]'s ownership interest in [facility] and [facility], as well as of the fact that they are under no obligation to receive care at these facilities.  PASRR submitted to EDS on 01/02/16     PASRR number received on 01/02/16     Existing PASRR number confirmed on       FL2 transmitted to all facilities in geographic area requested by pt/family on 01/02/16     FL2 transmitted to all facilities within larger geographic area on       Patient informed that  his/her managed care company has contracts with or will negotiate with certain facilities, including the following:        Yes   Patient/family informed of bed offers received.  Patient chooses bed at [facility]     Physician recommends and patient chooses bed at      Patient to be transferred to [facility] on 01/02/16.  Patient to be transferred to facility by [facility]     Patient family notified on 01/02/16 of transfer.  Name of family member notified:  patient's wife, Mardene Celeste at bedside     PHYSICIAN       Additional Comment:    _______________________________________________ Standley Brooking, LCSW 01/02/2016, 12:21 PM

## 2016-01-02 NOTE — Discharge Summary (Signed)
Physician Discharge Summary  [name] [id number] DOB: [birth year] DOA: 12/31/2015  PCP:  Melinda Crutch, MD  Admit date: 12/31/2015 Discharge date: 01/02/2016  Time spent: 35 minutes  Recommendations for Outpatient Follow-up:  Follow up with Dr Kathyrn Sheriff for left side weakness, and with Dr Tammi Klippel.  Follow up with ortho for humerus fracture.   Discharge Diagnoses:    Closed right humeral fracture   Hypertension   GERD (gastroesophageal reflux disease)   Restless leg syndrome   Anemia   Meningioma, recurrent of brain ([place])   Discharge Condition: stable   Diet recommendation: stable  Filed Weights   01/01/16 0500  Weight: 90.719 kg (200 lb)    History of present illness:  : [name] is a 82 y.o. male with a past medical history of prostate cancer, hyperlipidemia, restless syndrome, GERD, recurrent meningioma, seizures, obstructive sleep apnea, who comes to the emergency department with right shoulder pain after a fall this morning.  Per patient, he is having a recurrence of symptoms from his meningioma, which is usually affect him with left-sided weakness. He states that he has been having trouble lifting his LLE for ambulation and uses a cane with his right upper extremity. He is trying to walk this morning when his foot Caught up in the carpet and he felt on his right side radiating his right shoulder. He felt immediate pain, which has been progressively worse since then. An x-ray taken in the emergency department confirms nondisplaced minimal impacted fracture of the right proximal humerus.   Hospital Course:  Closed right humeral fracture Continue immobilization. Analgesics as needed. Discussed case with Dr Elmyra Ricks, he recommend patient to use sling for fracture, OT for pendulum exercise.    Hypertension Continue valsartan 160 mg by mouth daily. Monitor blood pressure periodically.   GERD (gastroesophageal reflux disease) Continue proton pump inhibitor.    Restless leg syndrome Continue Requip 3 mg by mouth at bedtime.   Anemia Monitor H&H.   Meningioma, recurrent of brain [facility]) Patient complaining of worsening Left side weakness over last months.  Discussed with Dr Tammi Klippel, start decadron TID and will order MRI. Patient to follow in the office on Monday  MRI stable, discussed with Dr Annette Stable, patient to follow up with Dr Kathyrn Sheriff after discharge. Continue with decadron.   Procedures:  none  Consultations:  Dr Tammi Klippel over phone  Neurosurgery over phone  ortho  Discharge Exam: Filed Vitals:   01/02/16 0550 01/02/16 0944  BP: 106/82 113/64  Pulse: 73 83  Temp: 97.7 F (36.5 C)   Resp: 16     General: NAD Cardiovascular: S 1, S 2 RRR Respiratory: CTA Neuro; left LE 4/5, normal sensation, left upper extremity 5/5  Discharge Instructions   Discharge Instructions    Diet - low sodium heart healthy    Complete by:  As directed      Increase activity slowly    Complete by:  As directed           Current Discharge Medication List    START taking these medications   Details  dexamethasone (DECADRON) 4 MG tablet Take 1 tablet (4 mg total) by mouth every 8 (eight) hours. Qty: 21 tablet, Refills: 0    oxyCODONE-acetaminophen (PERCOCET) 5-325 MG tablet Take 1 tablet by mouth every 8 (eight) hours as needed. Qty: 20 tablet, Refills: 0      CONTINUE these medications which have NOT CHANGED   Details  acetaminophen (TYLENOL) 500 MG tablet Take 1,000 mg by  mouth every 8 (eight) hours as needed (pain).    b complex vitamins tablet Take 1 tablet by mouth daily.    famotidine (PEPCID) 20 MG tablet Take 20 mg by mouth at bedtime.    ferrous sulfate 325 (65 FE) MG tablet Take 1,300 mg by mouth every morning.     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    multivitamin-lutein (OCUVITE-LUTEIN) CAPS capsule Take 1 capsule by mouth daily.   Associated Diagnoses: Iron deficiency anemia due to chronic blood loss    Probiotic Product (PROBIOTIC DAILY PO) Take 1 capsule by mouth daily.     rOPINIRole (REQUIP) 3 MG tablet Take 3 mg by mouth at bedtime. Pt takes 1 hour before bedtime Refills: 3   Associated Diagnoses: Iron deficiency anemia due to chronic blood loss    solifenacin (VESICARE) 5 MG tablet Take 1 tablet (5 mg total) by mouth daily. Qty: 30 tablet, Refills: 1    tamsulosin (FLOMAX) 0.4 MG CAPS capsule Take 1 capsule (0.4 mg total) by mouth daily after supper. Qty: 30 capsule, Refills: 1    valsartan (DIOVAN) 160 MG tablet Take 160 mg by mouth daily.    vitamin B-12 (CYANOCOBALAMIN) 100 MCG tablet Take 100 mcg by mouth daily.     omeprazole (PRILOSEC) 20 MG capsule Take 1 capsule (20 mg total) by mouth daily. Qty: 30 capsule, Refills: 1       Allergies  Allergen Reactions  . Asa [Aspirin] Nausea Only  . Other Itching and Rash    Nuts  . Vicodin [Hydrocodone-Acetaminophen] Nausea Only   Follow-up Information    Follow up with [place] [facility] DEPT.   Specialty:  Emergency Medicine   Contact information:   [place] [id number] [place] [phone]      Follow up with Consuella Lose, C, MD In 3 days.   Specialty:  Neurosurgery   Contact information:   [address] [place] [address] [place] [address] [phone]       Follow up with Tyler Pita, MD In 3 days.   Specialty:  Radiation Oncology   Contact information:   [place] [address] [phone]       Follow up with Mauri Pole, MD In 1 week.   Specialty:  Orthopedic Surgery   Contact information:   [address] [place] [address] [place] [address] [phone]        The results of significant diagnostics from this hospitalization (including imaging, microbiology, ancillary and laboratory) are listed below for reference.    Significant Diagnostic Studies: Dg Chest 2 View  12/31/2015  CLINICAL DATA:  Right anterior rib pain after fall today. Evaluate for rib fracture. EXAM: CHEST  2 VIEW COMPARISON:  11/28/2014 FINDINGS: Normal heart size. There is a known moderate hiatal hernia widening the lower mediastinal contours. There is no edema, consolidation,  effusion, or pneumothorax. No visible fracture. Advanced right glenohumeral osteoarthritis with high humeral head. IMPRESSION: 1. No acute/ posttraumatic finding. 2. Moderate hiatal hernia. Electronically Signed   By: Monte Fantasia M.D.   On: 12/31/2015 19:33   Dg Shoulder Right  12/31/2015  CLINICAL DATA:  Fall, right shoulder pain EXAM: RIGHT SHOULDER - 2+ VIEW COMPARISON:  Right Shoulder same day FINDINGS: Two views of the right shoulder submitted. There is nondisplaced minimal impacted fracture of the right proximal humerus. There is cortical disruption greater humeral tuberosity. IMPRESSION: There is nondisplaced minimal impacted fracture of the right proximal humerus. There is cortical disruption greater humeral tuberosity. Electronically Signed   By: Lahoma Crocker M.D.   On: 12/31/2015 16:07   Dg Shoulder Right  12/31/2015  CLINICAL DATA:  Tripped and fell landing on right shoulder with resulting pain. EXAM: RIGHT SHOULDER - 2+ VIEW COMPARISON:  None. FINDINGS: There are degenerative  changes of the glenohumeral joint and normally over the [facility] joint. Possible cortical disruption over the greater tuberosity extending towards the humeral neck as cannot exclude a fracture in this location. No evidence of shoulder dislocation. IMPRESSION: Possible fracture involving the greater tuberosity extending towards the humeral neck. Consider internal and external rotation views. Electronically Signed   By: Marin Olp M.D.   On: 12/31/2015 15:27   Ct Head Wo Contrast  12/31/2015  CLINICAL DATA:  Meningioma.  Fell today. EXAM: CT HEAD WITHOUT CONTRAST TECHNIQUE: Contiguous axial images were obtained from the base of the skull through the vertex without intravenous contrast. COMPARISON:  MRI 11/09/2015 FINDINGS: Postop craniotomy for meningioma resection over the right convexity. Recurrent tumor best seen on the prior MRI with contrast. Right parietal white matter edema has progressed since the prior MRI. This could be due to actively growing tumor. Generalized atrophy. Negative for acute infarct. Negative for intracranial hemorrhage. Negative for skull fracture. IMPRESSION: No intracranial hemorrhage. Right parietal convexity meningioma best seen on prior MRI. Increase in white matter edema in the right parietal lobe suggesting active tumor growth. Tumor best evaluated on MRI with contrast. Electronically Signed   By: Franchot Gallo M.D.   On: 12/31/2015 15:41   Mr Jeri Cos [id number] Contrast  01/01/2016  CLINICAL DATA:  82 year old male with right cerebral convexity grade 2 meningioma status post resection x2 and radiotherapy x2, most recently 24 Gy completed in November 2016. Increasing left side weakness, especially in the left lower extremity. Subsequent difficult gait and tripped and fell today. Subsequent encounter. EXAM: MRI HEAD WITHOUT AND WITH CONTRAST TECHNIQUE: Multiplanar, multiecho pulse sequences of the brain and surrounding structures were obtained without and with intravenous contrast.  CONTRAST:  68mL MULTIHANCE GADOBENATE DIMEGLUMINE 529 MG/ML IV SOLN COMPARISON:  Head CT without contrast 12/31/2015. Brain MRI 11/09/2015 and earlier FINDINGS: Major intracranial vascular flow voids are stable, with chronic loss of the posterior and superior sagittal sinus flow void. Dominant distal right vertebral artery. No restricted diffusion or evidence of acute infarction. Residual small volume extra-axial fluid collection underlying the superior right craniotomy defect. Slight mass effect on the right superior and middle frontal gyri is stable since February. Postoperative encephalomalacia plus confluent T2 and FLAIR hyperintensity throughout the right superior frontal gyrus with mild regional mass effect appears stable since February. Stable ventricle size and configuration. No new intracranial hemorrhage. Basilar cisterns remain patent. Negative pituitary, cervicomedullary junction and visualized cervical spine. Bone marrow signal remains normal. Outside of the treatment site, gray and white matter signal remain stable. Mild motion artifact  on post-contrast images today. Following contrast residual en plaque type nodular meningioma enhancement is re- demonstrated from the posterior convexity in the midline tracking anteriorly and laterally underlying the craniotomy site. Confluent nodular components along the anterior (series 11, image 45) and posterior (image 38) extent are re- demonstrated. Extension into the sulcus on series 12, image 10 is re - identified. Overall, the tumor volume appears stable since February - compare series 13, image 13 today to series 12, image 19 on 11/09/2015. The Posterior nodularity on series 11, image 38 demonstrates questionable slight increase in thickness (12 mm now versus 9 mm previously). No new areas of abnormal enhancement. Visible internal auditory structures appear normal. Mild mastoid effusions are stable. Stable paranasal sinuses. Stable orbit and scalp soft  tissues. IMPRESSION: 1. Overall stable post treatment appearance of the brain since 11/09/2015. Residual fairly extensive en plaque meningioma over the right cerebral convexity. Attention directed on followup to the posterior nodular component seen on series 11, image 38 today. 2. Associated T2 and FLAIR hyperintensity in right hemisphere is stable. 3. Chronic extra-axial postoperative fluid collection underlying the craniotomy site is stable with mild mass effect. 4. No new intracranial abnormality. Electronically Signed   By: Genevie Ann M.D.   On: 01/01/2016 14:37    Microbiology: No results found for this or any previous visit (from the past 240 hour(s)).   Labs: Basic Metabolic Panel:  Recent Labs Lab 12/31/15 2048  NA 138  K 3.9  CL 104  CO2 27  GLUCOSE 95  BUN 11  CREATININE 1.22  CALCIUM 8.5*   Liver Function Tests: No results for input(s): AST, ALT, ALKPHOS, BILITOT, PROT, ALBUMIN in the last 168 hours. No results for input(s): LIPASE, AMYLASE in the last 168 hours. No results for input(s): AMMONIA in the last 168 hours. CBC:  Recent Labs Lab 12/31/15 2048  WBC 8.7  NEUTROABS 6.4  HGB 11.9*  HCT 36.5*  MCV 93.8  PLT 277   Cardiac Enzymes: No results for input(s): CKTOTAL, CKMB, CKMBINDEX, TROPONINI in the last 168 hours. BNP: BNP (last 3 results) No results for input(s): BNP in the last 8760 hours.  ProBNP (last 3 results) No results for input(s): PROBNP in the last 8760 hours.  CBG: No results for input(s): GLUCAP in the last 168 hours.     Signed:  Elmarie Shiley MD.  [facility] 01/02/2016, 12:21 PM

## 2016-01-02 NOTE — Evaluation (Signed)
Physical Therapy Evaluation Patient Details Name: [name] MRN: [id number] DOB: 11-Sep-1931 Today's Date: 01/02/2016   History of Present Illness  pt was admitted after a fall resulting in R minimally displaced impacted fx of proximal humerus.  Pt has a PMH significant for prostate CA, recurrent meningioma, and seizure.  He has L sided weakness  Clinical Impression  Pt admitted with above diagnosis. Pt currently with functional limitations due to the deficits listed below (see PT Problem List).  Pt will benefit from skilled PT to increase their independence and safety with mobility to allow discharge to the venue listed below. Pt moving better than he did yesterday with OT, but still with decreased balance and +2 for safety.  Pt tests strong in L LE, but decreased coordination and functional movement.  Recommend SNF.     Follow Up Recommendations SNF;Supervision for mobility/OOB    Equipment Recommendations  None recommended by PT    Recommendations for Other Services       Precautions / Restrictions Precautions Precautions: Fall Precaution Comments: L sided weakness.  Sling/immobilizer on R Restrictions Weight Bearing Restrictions: Yes (NWB on RUE (has prox humerus fx)) RUE Weight Bearing: Non weight bearing      Mobility  Bed Mobility Overal bed mobility: Needs Assistance;+2 for physical assistance Bed Mobility: Supine to Sit     Supine to sit: Min assist;+2 for physical assistance     General bed mobility comments: Pt moved legs, but needed A for trunk  Transfers Overall transfer level: Needs assistance Equipment used: 2 person hand held assist Transfers: Sit to/from Stand;Stand Pivot Transfers Sit to Stand: +2 physical assistance;Min assist Stand pivot transfers: Min assist;+2 physical assistance       General transfer comment: Cues for hand placement and L knee blocked for sit to stand.  Did a slow SPT to recliner with arm rest down in case he needed to sit  early.  Pt had difficulty moving L LE, but able to move it 75% of the time without A.  Ambulation/Gait             General Gait Details: SPT only  Stairs            Wheelchair Mobility    Modified Rankin (Stroke Patients Only)       Balance Overall balance assessment: History of Falls;Needs assistance Sitting-balance support: Feet supported Sitting balance-Leahy Scale: Fair       Standing balance-Leahy Scale: Poor Standing balance comment: L knee blocked in case it buckeled, R sling intact                             Pertinent Vitals/Pain Pain Assessment: Faces Faces Pain Scale: Hurts little more Pain Location: R shoulder Pain Descriptors / Indicators: Grimacing Pain Intervention(s): Premedicated before session;Limited activity within patient's tolerance    Home Living Family/patient expects to be discharged to:: Skilled nursing facility Living Arrangements: Spouse/significant other                    Prior Function Level of Independence: Independent with assistive device(s)         Comments: used cane in R hand prior to fall     Hand Dominance   Dominant Hand: Right    Extremity/Trunk Assessment   Upper Extremity Assessment: Defer to OT evaluation           Lower Extremity Assessment: LLE deficits/detail   LLE Deficits / Details:  L quads 4+/5, hip flex 4-/5  Cervical / Trunk Assessment: Normal  Communication   Communication: No difficulties  Cognition Arousal/Alertness: Awake/alert Behavior During Therapy: WFL for tasks assessed/performed Overall Cognitive Status: Within Functional Limits for tasks assessed                      General Comments      Exercises        Assessment/Plan    PT Assessment Patient needs continued PT services  PT Diagnosis Difficulty walking;Generalized weakness   PT Problem List Decreased strength;Decreased activity tolerance;Decreased balance;Decreased  mobility;Decreased coordination;Decreased knowledge of use of DME  PT Treatment Interventions DME instruction;Gait training;Functional mobility training;Therapeutic activities;Therapeutic exercise;Balance training   PT Goals (Current goals can be found in the Care Plan section) Acute Rehab PT Goals Patient Stated Goal: to move better PT Goal Formulation: With patient Time For Goal Achievement: 01/16/16 Potential to Achieve Goals: Good    Frequency Min 3X/week   Barriers to discharge        Co-evaluation               End of Session Equipment Utilized During Treatment: Gait belt Activity Tolerance: Patient tolerated treatment well Patient left: in chair;with call bell/phone within reach;with family/visitor present Nurse Communication: Mobility status (lateral scoot transfer back to bed)    Functional Assessment Tool Used: clinical judgement and objective findings Functional Limitation: Mobility: Walking and moving around;Changing and maintaining body position Changing and Maintaining Body Position Current Status [phone]): At least 20 percent but less than 40 percent impaired, limited or restricted Changing and Maintaining Body Position Goal Status [id number]): At least 1 percent but less than 20 percent impaired, limited or restricted    Time: [id number] PT Time Calculation (min) (ACUTE ONLY): 16 min   Charges:   PT Evaluation $PT Eval Moderate Complexity: 1 Procedure     PT G Codes:   PT G-Codes **NOT FOR INPATIENT CLASS** Functional Assessment Tool Used: clinical judgement and objective findings Functional Limitation: Mobility: Walking and moving around;Changing and maintaining body position Changing and Maintaining Body Position Current Status [phone]): At least 20 percent but less than 40 percent impaired, limited or restricted Changing and Maintaining Body Position Goal Status [id number]): At least 1 percent but less than 20 percent impaired, limited or restricted     [facility] 01/02/2016, 12:18 PM

## 2016-01-02 NOTE — Care Management Obs Status (Signed)
[facility] NOTIFICATION   Patient Details  Name: [name] MRN: [id number] Date of Birth: [birth year]   Medicare Observation Status Notification Given:  Yes    Erenest Rasher, RN 01/02/2016, 10:11 AM

## 2016-01-04 ENCOUNTER — Telehealth: Payer: Self-pay | Admitting: Oncology

## 2016-01-04 NOTE — Telephone Encounter (Signed)
Mardene Celeste called and asked when Pablo's next appointment is with Dr. Tammi Klippel.  She thought it was on 01/07/16.  Advised her that there is not any appointments in the system but that Dr. Johny Shears nurse, Joaquim Lai, RN will be notified and will call her back.

## 2016-01-05 ENCOUNTER — Telehealth: Payer: Self-pay | Admitting: Radiation Oncology

## 2016-01-05 NOTE — Telephone Encounter (Signed)
Sam,  Let's start a taper Monday for simplicity's sake due to being in the facility. He's doing 4mg  TID currently, so lets switch to 2 mg TID for 5 days starting Monday, then 2mg  BID for 5 days, 2 mg daily for 5 days, then 1mg  daily for 5 days then stop.  Thanks, Bryson Ha

## 2016-01-05 NOTE — Telephone Encounter (Signed)
Phoned patient. No answer. Left message explaining that [facility] will reach out to them closer to August with an MRI and follow up appointment. Encouraged them to call with future needs as well.

## 2016-01-05 NOTE — Telephone Encounter (Signed)
Patient's wife called. She expressed appreciation for my call. She reports her husband has been discharged to [facility] on decadron. She is uncertain how long he will remain at [facility] since he is so weak and in need of rehab. Hospital records indicate the patient was discharged on decadron 4 mg every eight hours.

## 2016-01-06 ENCOUNTER — Telehealth: Payer: Self-pay | Admitting: Oncology

## 2016-01-06 ENCOUNTER — Telehealth: Payer: Self-pay | Admitting: Radiation Oncology

## 2016-01-06 NOTE — Telephone Encounter (Signed)
Opened in error

## 2016-01-06 NOTE — Telephone Encounter (Signed)
Phoned Mardene Celeste and provided verbal order for decadron taper. Per Shona Simpson' order.   NIKOLAUS MALECKI  01/05/2016  Telephone  MRN:  [id number]   Description: 82 year old male  Provider: Heywood Footman, RN  Department: [facility]          Call Documentation      Hayden Pedro, PA-C at 01/05/2016 4:06 PM     Status: Signed       Expand All Collapse All   Sam,  Let's start a taper Monday for simplicity's sake due to being in the facility. He's doing 4mg  TID currently, so lets switch to 2 mg TID for 5 days starting Monday, then 2mg  BID for 5 days, 2 mg daily for 5 days, then 1mg  daily for 5 days then stop.  Thanks, Carmin Muskrat requested the order be faxed to [phone]. Order faxed and confirmation fax of delivery obtained. Also, phoned patient's wife making her aware of taper. She verbalized understanding and expressed appreciation for the call.

## 2016-01-06 NOTE — Telephone Encounter (Signed)
[name] from [facility] called to cx 4.25 appt....done

## 2016-01-06 NOTE — Telephone Encounter (Signed)
salita from [facility] called to cx all appts for pt he said he will call back to r/s after he gets out of rehab

## 2016-01-08 ENCOUNTER — Ambulatory Visit: Payer: Medicare Other | Admitting: Neurology

## 2016-01-12 ENCOUNTER — Ambulatory Visit: Payer: Medicare Other | Admitting: Oncology

## 2016-01-12 ENCOUNTER — Telehealth: Payer: Self-pay | Admitting: Oncology

## 2016-01-12 NOTE — Telephone Encounter (Signed)
returned call and s.w pt and cx all appts due to bad fall he is in rehab....will call back to r/s

## 2016-01-29 ENCOUNTER — Other Ambulatory Visit: Payer: Medicare Other

## 2016-01-29 ENCOUNTER — Ambulatory Visit: Payer: Medicare Other | Admitting: Oncology

## 2016-02-05 ENCOUNTER — Telehealth: Payer: Self-pay | Admitting: Family Medicine

## 2016-02-05 DIAGNOSIS — G40109 Localization-related (focal) (partial) symptomatic epilepsy and epileptic syndromes with simple partial seizures, not intractable, without status epilepticus: Secondary | ICD-10-CM

## 2016-02-05 MED ORDER — LAMOTRIGINE 25 MG PO TABS
ORAL_TABLET | ORAL | Status: DC
Start: 2016-02-05 — End: 2016-03-28

## 2016-02-05 NOTE — Telephone Encounter (Signed)
Recommend increasing lamictal 75mg  twice daily for one week, then increase to 100mg  twice daily.  Continue Keppra 750mg  twice daily.  Please schedule him with a sooner f/u with Dr. Delice Lesch in July.

## 2016-02-05 NOTE — Telephone Encounter (Signed)
Spoke with patient and notified him of advisement. He is agreeable to increase lamotrigine, will send new Rx into his pharmacy. F/u appt moved to 8/2. Did advise patient to call us back if he has an further questions or concerns before his f/u appt.

## 2016-02-05 NOTE — Telephone Encounter (Signed)
Patient called wanting to be seen sooner than scheduled f/u in Oct. States that his tumor is "acting up again", states his left leg seems to be dragging more than usual & left arm twitching more. States the leg has been dragging for the past 3 yrs. He had brain mri after a fall on 01/01/16, but states he was never given the result.

## 2016-03-07 ENCOUNTER — Observation Stay (HOSPITAL_COMMUNITY)
Admission: EM | Admit: 2016-03-07 | Discharge: 2016-03-08 | Disposition: A | Discharge status: Home / Self Care | Payer: Medicare Other | Attending: Internal Medicine | Admitting: Internal Medicine

## 2016-03-07 ENCOUNTER — Encounter (HOSPITAL_COMMUNITY): Payer: Self-pay | Admitting: Emergency Medicine

## 2016-03-07 DIAGNOSIS — G4733 Obstructive sleep apnea (adult) (pediatric): Secondary | ICD-10-CM | POA: Diagnosis present

## 2016-03-07 DIAGNOSIS — Z85828 Personal history of other malignant neoplasm of skin: Secondary | ICD-10-CM | POA: Diagnosis not present

## 2016-03-07 DIAGNOSIS — Z8546 Personal history of malignant neoplasm of prostate: Secondary | ICD-10-CM | POA: Insufficient documentation

## 2016-03-07 DIAGNOSIS — N183 Chronic kidney disease, stage 3 unspecified: Secondary | ICD-10-CM | POA: Diagnosis present

## 2016-03-07 DIAGNOSIS — E785 Hyperlipidemia, unspecified: Secondary | ICD-10-CM | POA: Insufficient documentation

## 2016-03-07 DIAGNOSIS — Z85841 Personal history of malignant neoplasm of brain: Secondary | ICD-10-CM | POA: Diagnosis not present

## 2016-03-07 DIAGNOSIS — E669 Obesity, unspecified: Secondary | ICD-10-CM | POA: Diagnosis present

## 2016-03-07 DIAGNOSIS — I10 Essential (primary) hypertension: Secondary | ICD-10-CM | POA: Diagnosis not present

## 2016-03-07 DIAGNOSIS — R609 Edema, unspecified: Secondary | ICD-10-CM | POA: Diagnosis not present

## 2016-03-07 DIAGNOSIS — I351 Nonrheumatic aortic (valve) insufficiency: Secondary | ICD-10-CM | POA: Diagnosis present

## 2016-03-07 DIAGNOSIS — D509 Iron deficiency anemia, unspecified: Principal | ICD-10-CM | POA: Insufficient documentation

## 2016-03-07 DIAGNOSIS — Z8669 Personal history of other diseases of the nervous system and sense organs: Secondary | ICD-10-CM | POA: Diagnosis not present

## 2016-03-07 DIAGNOSIS — R600 Localized edema: Secondary | ICD-10-CM | POA: Diagnosis present

## 2016-03-07 DIAGNOSIS — G40909 Epilepsy, unspecified, not intractable, without status epilepticus: Secondary | ICD-10-CM

## 2016-03-07 LAB — BASIC METABOLIC PANEL
Anion gap: 7 (ref 5–15)
BUN: 31 mg/dL — AB (ref 6–20)
CHLORIDE: 102 mmol/L (ref 101–111)
CO2: 27 mmol/L (ref 22–32)
Calcium: 8.4 mg/dL — ABNORMAL LOW (ref 8.9–10.3)
Creatinine, Ser: 1.14 mg/dL (ref 0.61–1.24)
GFR calc Af Amer: >60 mL/min (ref >60)
GFR calc non Af Amer: 58 mL/min — ABNORMAL LOW (ref >60)
GLUCOSE: 93 mg/dL (ref 65–99)
POTASSIUM: 3.4 mmol/L — AB (ref 3.5–5.1)
Sodium: 136 mmol/L (ref 135–145)

## 2016-03-07 LAB — CBC
HEMATOCRIT: 18.6 % — AB (ref 39.0–52.0)
HEMOGLOBIN: 5.6 g/dL — AB (ref 13.0–17.0)
MCH: 27.3 pg (ref 26.0–34.0)
MCHC: 30.1 g/dL (ref 30.0–36.0)
MCV: 90.7 fL (ref 78.0–100.0)
Platelets: 356 10*3/uL (ref 150–400)
RBC: 2.05 MIL/uL — ABNORMAL LOW (ref 4.22–5.81)
RDW: 18.1 % — AB (ref 11.5–15.5)
WBC: 10.7 10*3/uL — ABNORMAL HIGH (ref 4.0–10.5)

## 2016-03-07 LAB — PREPARE RBC (CROSSMATCH)

## 2016-03-07 MED ORDER — TAMSULOSIN HCL 0.4 MG PO CAPS
0.4000 mg | ORAL_CAPSULE | Freq: Every day | ORAL | Status: DC
  Administered 2016-03-08: 0.4 mg via ORAL
  Filled 2016-03-07: qty 1

## 2016-03-07 MED ORDER — FUROSEMIDE 10 MG/ML IJ SOLN
40.0000 mg | Freq: Every day | INTRAMUSCULAR | Status: DC
  Administered 2016-03-08: 40 mg via INTRAVENOUS
  Filled 2016-03-07: qty 4

## 2016-03-07 MED ORDER — LAMOTRIGINE 100 MG PO TABS
100.0000 mg | ORAL_TABLET | Freq: Two times a day (BID) | ORAL | Status: DC
  Administered 2016-03-07 – 2016-03-08 (×2): 100 mg via ORAL
  Filled 2016-03-07 (×3): qty 1

## 2016-03-07 MED ORDER — FAMOTIDINE 20 MG PO TABS
20.0000 mg | ORAL_TABLET | Freq: Every day | ORAL | Status: DC
  Administered 2016-03-07: 20 mg via ORAL
  Filled 2016-03-07: qty 1

## 2016-03-07 MED ORDER — FUROSEMIDE 10 MG/ML IJ SOLN
40.0000 mg | Freq: Once | INTRAMUSCULAR | Status: AC
  Administered 2016-03-07: 40 mg via INTRAVENOUS
  Filled 2016-03-07: qty 4

## 2016-03-07 MED ORDER — IRBESARTAN 150 MG PO TABS: 150.0000 mg | ORAL_TABLET | Freq: Every day | ORAL | Status: DC

## 2016-03-07 MED ORDER — DARIFENACIN HYDROBROMIDE ER 7.5 MG PO TB24
7.5000 mg | ORAL_TABLET | Freq: Every day | ORAL | Status: DC
  Administered 2016-03-08: 7.5 mg via ORAL
  Filled 2016-03-07: qty 1

## 2016-03-07 MED ORDER — ONDANSETRON HCL 4 MG PO TABS: 4.0000 mg | ORAL_TABLET | Freq: Four times a day (QID) | ORAL | Status: DC | PRN

## 2016-03-07 MED ORDER — ACETAMINOPHEN 325 MG PO TABS: 650.0000 mg | ORAL_TABLET | Freq: Four times a day (QID) | ORAL | Status: DC | PRN

## 2016-03-07 MED ORDER — ROPINIROLE HCL 3 MG PO TABS
3.0000 mg | ORAL_TABLET | Freq: Every day | ORAL | Status: DC
  Administered 2016-03-07: 3 mg via ORAL
  Filled 2016-03-07 (×2): qty 1

## 2016-03-07 MED ORDER — SODIUM CHLORIDE 0.9 % IV SOLN
25.0000 mg | Freq: Once | INTRAVENOUS | Status: AC
  Administered 2016-03-08: 25 mg via INTRAVENOUS
  Filled 2016-03-07: qty 2

## 2016-03-07 MED ORDER — POTASSIUM CHLORIDE CRYS ER 20 MEQ PO TBCR
40.0000 meq | EXTENDED_RELEASE_TABLET | ORAL | Status: AC
  Administered 2016-03-07 (×2): 40 meq via ORAL
  Filled 2016-03-07 (×2): qty 2

## 2016-03-07 MED ORDER — SODIUM CHLORIDE 0.9 % IV SOLN
Freq: Once | INTRAVENOUS | Status: AC
  Administered 2016-03-07: 20:00:00 via INTRAVENOUS

## 2016-03-07 MED ORDER — ONDANSETRON HCL 4 MG/2ML IJ SOLN: 4.0000 mg | Freq: Four times a day (QID) | INTRAMUSCULAR | Status: DC | PRN

## 2016-03-07 MED ORDER — DEXAMETHASONE 0.5 MG PO TABS
0.5000 mg | ORAL_TABLET | Freq: Every day | ORAL | Status: DC
  Filled 2016-03-07: qty 1

## 2016-03-07 MED ORDER — SODIUM CHLORIDE 0.9 % IV SOLN
125.0000 mg | Freq: Once | INTRAVENOUS | Status: AC
  Administered 2016-03-08: 125 mg via INTRAVENOUS
  Filled 2016-03-07: qty 10

## 2016-03-07 MED ORDER — FUROSEMIDE 40 MG PO TABS: 20.0000 mg | ORAL_TABLET | Freq: Every day | ORAL | Status: DC

## 2016-03-07 MED ORDER — LEVETIRACETAM 750 MG PO TABS
750.0000 mg | ORAL_TABLET | Freq: Two times a day (BID) | ORAL | Status: DC
  Administered 2016-03-07 – 2016-03-08 (×2): 750 mg via ORAL
  Filled 2016-03-07 (×3): qty 1

## 2016-03-07 MED ORDER — MAGNESIUM GLUCONATE 500 MG PO TABS
500.0000 mg | ORAL_TABLET | Freq: Every day | ORAL | Status: DC
  Administered 2016-03-07 – 2016-03-08 (×2): 500 mg via ORAL
  Filled 2016-03-07 (×2): qty 1

## 2016-03-07 MED ORDER — ACETAMINOPHEN 650 MG RE SUPP: 650.0000 mg | Freq: Four times a day (QID) | RECTAL | Status: DC | PRN

## 2016-03-07 NOTE — ED Notes (Signed)
3+ pitting edema bilaterally from feet to mid shin each side.

## 2016-03-07 NOTE — Progress Notes (Signed)
Nursing Note: First unit of blood in .No signs of reaction to transfusion.T-97.7 P-78 r-24 blood pressure-107/53 PO2 100 % on r/a.Pt up to the bathroom and resting quietly in bed w/o complaints.wbb

## 2016-03-07 NOTE — ED Provider Notes (Signed)
CSN: [id number]     Arrival date & time 03/07/16  1557 History   First MD Initiated Contact with Patient 03/07/16 1713     Chief Complaint  Patient presents with  . low HgB      HPI Patient presents with iron deficiency anemia. Primary care doctor checked a hemoglobin because he has been more short of breath. Found to have a hemoglobin of 5. History of recurrent anemia due to iron deficiency. He's been extensively worked up by oncology and Dr. Kathi Der got from GI. No previous GI bleed found. Patient states he does have some worsening swelling in his legs. No fevers or chills. No black stools. He does get shortness of breath.   Past Medical History  Diagnosis Date  . Anemia   . Prostate cancer ([place])   . Basal cell cancer   . Hyperlipidemia   . Restless leg   . GERD (gastroesophageal reflux disease)   . Brain tumor ([place])   . History of radiation therapy     back approximately 2010 prostate radiation under care of Dr. Valere Dross   . Hypertension   . Herniated disc     lumbar spine  . OSA (obstructive sleep apnea) 10/28/2014    Moderate with AHI 21/hr  . Meningioma ([place])     brain  . Hiatal hernia   . Occult blood in stools   . Nocturia   . Aortic insufficiency 08/11/2015  . Seizures [facility])    Past Surgical History  Procedure Laterality Date  . Joint replacement      right knee replacement  . Bilateral cateract surgery    . Vasectomy    . Colonoscopy      colon polyp removed  . Craniotomy Right 09/27/2013    Procedure: RIGHT FRONTAL CRANIOTOMY FOR TUMOR RESECTION ;  Surgeon: Consuella Lose, MD;  Location: [place] NEURO ORS;  Service: Neurosurgery;  Laterality: Right;  . Prostate biopsy    . Tonsillectomy      Agae 6-7  . Craniotomy Right 02/17/2015    Procedure: Right frontal parietal craniotomy for resection of meningioma with brainlab;  Surgeon: Consuella Lose, MD;  Location: [place] NEURO ORS;  Service: Neurosurgery;  Laterality: Right;  Right frontal parietal craniotomy for resection  of meningioma with brainlab  . Application of cranial navigation N/A 02/17/2015    Procedure: APPLICATION OF CRANIAL NAVIGATION;  Surgeon: Consuella Lose, MD;  Location: [place] NEURO ORS;  Service: Neurosurgery;  Laterality: N/A;  APPLICATION OF CRANIAL NAVIGATION   Family History  Problem Relation Age of Onset  . Parkinsonism Mother   . Dementia Father    Social History  Substance Use Topics  . Smoking status: Never Smoker   . Smokeless tobacco: Former Systems developer    Types: [place] date: 10/08/1973  . Alcohol Use: No    Review of Systems  Constitutional: Negative for appetite change.  Respiratory: Positive for shortness of breath.   Cardiovascular: Negative for chest pain.  Gastrointestinal: Negative for abdominal pain.  Genitourinary: Negative for flank pain.  Musculoskeletal: Negative for back pain.  Skin: Positive for pallor.  Neurological: Negative for seizures.  Hematological: Negative for adenopathy.      Allergies  Asa; Other; and Vicodin  Home Medications   Prior to Admission medications   Medication Sig Start Date End Date Taking? Authorizing Provider  acetaminophen (TYLENOL) 500 MG tablet Take 1,000 mg by mouth every 8 (eight) hours as needed (pain).   Yes Historical Provider, MD  b  complex vitamins tablet Take 1 tablet by mouth daily.   Yes Historical Provider, MD  dexamethasone (DECADRON) 1 MG tablet Take 1 mg by mouth daily. 02/05/16  Yes Historical Provider, MD  famotidine (PEPCID) 20 MG tablet Take 20 mg by mouth at bedtime.   Yes Historical Provider, MD  ferrous sulfate 325 (65 FE) MG tablet Take 1,300 mg by mouth every morning.    Yes Historical Provider, MD  furosemide (LASIX) 20 MG tablet Take 20 mg by mouth daily. 02/22/16  Yes Historical Provider, MD  lamoTRIgine (LAMICTAL) 25 MG tablet Take 3 tablets twice a day for 1 week, then increase to 4 tablets twice a day and continue. Patient taking differently: Take 100 mg by mouth 2 (two) times daily.  02/05/16   Yes Alda Berthold, DO  levETIRAcetam (KEPPRA) 500 MG tablet Take 1 & 1/2 tablets twice a day 12/01/15  Yes Cameron Sprang, MD  magnesium gluconate (MAGONATE) 500 MG tablet Take 500 mg by mouth daily.   Yes Historical Provider, MD  multivitamin-lutein (OCUVITE-LUTEIN) CAPS capsule Take 1 capsule by mouth daily.   Yes Historical Provider, MD  oxyCODONE (OXY IR/ROXICODONE) 5 MG immediate release tablet Take 1 tablet (5 mg total) by mouth every 4 (four) hours as needed for moderate pain. 01/02/16  Yes Belkys A Regalado, MD  Probiotic Product (PROBIOTIC DAILY PO) Take 1 capsule by mouth daily.    Yes Historical Provider, MD  rOPINIRole (REQUIP) 3 MG tablet Take 1.5-3 mg by mouth daily. Take 3mg s at bedtime.  May take 1.5mg s during the day for restless legs then take the other 1.5mg s at bedtime (no more than 3 mgs daily) 11/18/14  Yes Historical Provider, MD  solifenacin (VESICARE) 5 MG tablet Take 1 tablet (5 mg total) by mouth daily. 10/18/13  Yes Daniel J Angiulli, PA-C  tamsulosin (FLOMAX) 0.4 MG CAPS capsule Take 1 capsule (0.4 mg total) by mouth daily after supper. 10/18/13  Yes Daniel J Angiulli, PA-C  valsartan (DIOVAN) 160 MG tablet Take 160 mg by mouth daily.   Yes Historical Provider, MD  vitamin B-12 (CYANOCOBALAMIN) 100 MCG tablet Take 100 mcg by mouth daily.    Yes Historical Provider, MD  dexamethasone (DECADRON) 4 MG tablet Take 1 tablet (4 mg total) by mouth every 8 (eight) hours. Patient not taking: Reported on 03/07/2016 01/02/16   Belkys A Regalado, MD   BP 117/65 mmHg  Pulse 81  Temp(Src) 98 F (36.7 C) (Oral)  Resp 17  SpO2 97% Physical Exam  Constitutional: He appears well-developed.  HENT:  Head: Atraumatic.  Neck: Neck supple.  Cardiovascular: Normal rate.   Pulmonary/Chest: Effort normal.  Abdominal: Soft.  Musculoskeletal: Normal range of motion. He exhibits edema.  Edema to bilateral lower extremities.  Neurological: He is alert.  Skin: Skin is warm. There is pallor.     ED Course  Procedures (including critical care time) Labs Review Labs Reviewed  BASIC METABOLIC PANEL - Abnormal; Notable for the following:    Potassium 3.4 (*)    BUN 31 (*)    Calcium 8.4 (*)    GFR calc non Af Amer 58 (*)    All other components within normal limits  CBC - Abnormal; Notable for the following:    WBC 10.7 (*)    RBC 2.05 (*)    Hemoglobin 5.6 (*)    HCT 18.6 (*)    RDW 18.1 (*)    All other components within normal limits  TYPE AND SCREEN  Imaging Review No results found. I have personally reviewed and evaluated these images and lab results as part of my medical decision-making.   EKG Interpretation None      MDM   Final diagnoses:  Iron deficiency anemia    Patient with anemia. History of same. He has not found a cause in the past. Will admit to internal medicine.     Davonna Belling, MD 03/07/16 [phone]

## 2016-03-07 NOTE — Progress Notes (Addendum)
Nursing Note: Checked vitals to give blood.BP 90/45.p-74.A; Paged on-call.Pt is also to get 40 lasix IV.Made on-call aware of all.Plan to give blood ,then lasix between unitswbb

## 2016-03-07 NOTE — ED Notes (Signed)
Bed: WA21 Expected date:  Expected time:  Means of arrival:  Comments: Hold for triage 2

## 2016-03-07 NOTE — H&P (Signed)
History and Physical    [name] [id number] DOB: 20-Oct-1930 DOA: 03/07/2016    PCP:  Melinda Crutch, MD  Patient coming from: home  Chief Complaint: sent for anemia  HPI: [name] is a 82 y.o. male with medical history significant of iron deficiency anemia, GERD, HTN, OSA, seizure disroder and Aortic insufficiency presents with Hb of 5. He went to see his PCP and mentioned that he felt fatigued. He has had dyspnea on exertion as well. No chest pain. Hie PCP checked his blood work and recommended he go to the hospital for a transfusion. He was last seen by Dr Alen Blew this past Feb and received an Iron infusion. He was due to go again in April for another transfusion but fell and had to go to SNF for rehab. He has not been able to make another appointment. He takes his oral Iron as prescribed. Extensive w/u for his anemia in the past has not found a resolvable etiology. No blood noted in stools.   ED Course: Typed and crossmatched by ER doctor. Vitals stable.   Review of Systems:  Pedal edema for 3 wks despite taking Lasix appropriately Easy bruising  All other systems reviewed and apart from HPI, are negative.  Past Medical History  Diagnosis Date  . Anemia   . Prostate cancer ([place])   . Basal cell cancer   . Hyperlipidemia   . Restless leg   . GERD (gastroesophageal reflux disease)   . Brain tumor ([place])   . History of radiation therapy     back approximately 2010 prostate radiation under care of Dr. Valere Dross   . Hypertension   . Herniated disc     lumbar spine  . OSA (obstructive sleep apnea) 10/28/2014    Moderate with AHI 21/hr  . Meningioma ([place])     brain  . Hiatal hernia   . Occult blood in stools   . Nocturia   . Aortic insufficiency 08/11/2015  . Seizures [facility])     Past Surgical History  Procedure Laterality Date  . Joint replacement      right knee replacement  . Bilateral cateract surgery    . Vasectomy    . Colonoscopy      colon polyp removed  .  Craniotomy Right 09/27/2013    Procedure: RIGHT FRONTAL CRANIOTOMY FOR TUMOR RESECTION ;  Surgeon: Consuella Lose, MD;  Location: [place] NEURO ORS;  Service: Neurosurgery;  Laterality: Right;  . Prostate biopsy    . Tonsillectomy      Agae 6-7  . Craniotomy Right 02/17/2015    Procedure: Right frontal parietal craniotomy for resection of meningioma with brainlab;  Surgeon: Consuella Lose, MD;  Location: [place] NEURO ORS;  Service: Neurosurgery;  Laterality: Right;  Right frontal parietal craniotomy for resection of meningioma with brainlab  . Application of cranial navigation N/A 02/17/2015    Procedure: APPLICATION OF CRANIAL NAVIGATION;  Surgeon: Consuella Lose, MD;  Location: [place] NEURO ORS;  Service: Neurosurgery;  Laterality: N/A;  APPLICATION OF CRANIAL NAVIGATION    Social History:   reports that he has never smoked. He quit smokeless tobacco use about 42 years ago. His smokeless tobacco use included Chew. He reports that he does not drink alcohol or use illicit drugs.  Allergies  Allergen Reactions  . Asa [Aspirin] Nausea Only  . Other Itching and Rash    Nuts  . Vicodin [Hydrocodone-Acetaminophen] Nausea Only    Family History  Problem Relation Age of Onset  .  Parkinsonism Mother   . Dementia Father      Prior to Admission medications   Medication Sig Start Date End Date Taking? Authorizing Provider  acetaminophen (TYLENOL) 500 MG tablet Take 1,000 mg by mouth every 8 (eight) hours as needed (pain).   Yes Historical Provider, MD  b complex vitamins tablet Take 1 tablet by mouth daily.   Yes Historical Provider, MD  dexamethasone (DECADRON) 1 MG tablet Take 1 mg by mouth daily. 02/05/16  Yes Historical Provider, MD  famotidine (PEPCID) 20 MG tablet Take 20 mg by mouth at bedtime.   Yes Historical Provider, MD  ferrous sulfate 325 (65 FE) MG tablet Take 1,300 mg by mouth every morning.    Yes Historical Provider, MD  furosemide (LASIX) 20 MG tablet Take 20 mg by mouth daily.  02/22/16  Yes Historical Provider, MD  lamoTRIgine (LAMICTAL) 25 MG tablet Take 3 tablets twice a day for 1 week, then increase to 4 tablets twice a day and continue. Patient taking differently: Take 100 mg by mouth 2 (two) times daily.  02/05/16  Yes Alda Berthold, DO  levETIRAcetam (KEPPRA) 500 MG tablet Take 1 & 1/2 tablets twice a day 12/01/15  Yes Cameron Sprang, MD  magnesium gluconate (MAGONATE) 500 MG tablet Take 500 mg by mouth daily.   Yes Historical Provider, MD  multivitamin-lutein (OCUVITE-LUTEIN) CAPS capsule Take 1 capsule by mouth daily.   Yes Historical Provider, MD  oxyCODONE (OXY IR/ROXICODONE) 5 MG immediate release tablet Take 1 tablet (5 mg total) by mouth every 4 (four) hours as needed for moderate pain. 01/02/16  Yes Belkys A Regalado, MD  Probiotic Product (PROBIOTIC DAILY PO) Take 1 capsule by mouth daily.    Yes Historical Provider, MD  rOPINIRole (REQUIP) 3 MG tablet Take 1.5-3 mg by mouth daily. Take 3mg s at bedtime.  May take 1.5mg s during the day for restless legs then take the other 1.5mg s at bedtime (no more than 3 mgs daily) 11/18/14  Yes Historical Provider, MD  solifenacin (VESICARE) 5 MG tablet Take 1 tablet (5 mg total) by mouth daily. 10/18/13  Yes Daniel J Angiulli, PA-C  tamsulosin (FLOMAX) 0.4 MG CAPS capsule Take 1 capsule (0.4 mg total) by mouth daily after supper. 10/18/13  Yes Daniel J Angiulli, PA-C  valsartan (DIOVAN) 160 MG tablet Take 160 mg by mouth daily.   Yes Historical Provider, MD  vitamin B-12 (CYANOCOBALAMIN) 100 MCG tablet Take 100 mcg by mouth daily.    Yes Historical Provider, MD  dexamethasone (DECADRON) 4 MG tablet Take 1 tablet (4 mg total) by mouth every 8 (eight) hours. Patient not taking: Reported on 03/07/2016 01/02/16   Elmarie Shiley, MD    Physical Exam: Filed Vitals:   03/07/16 1615 03/07/16 1825  BP: 117/65 121/65  Pulse: 81 73  Temp: 98 F (36.7 C)   TempSrc: Oral   Resp: 17 18  SpO2: 97% 99%      Constitutional: NAD,  calm, comfortable Eyes: PERTLA, lids normal conjunctivae pale ENMT: Mucous membranes are moist. Posterior pharynx clear of any exudate or lesions. Normal dentition.  Neck: normal, supple, no masses, no thyromegaly Respiratory: clear to auscultation bilaterally, no wheezing, no crackles. Normal respiratory effort. No accessory muscle use.  Cardiovascular: S1 & S2 heard, regular rate and rhythm, no murmurs / rubs / gallops. 2 + pedal edema. 2+ pedal pulses. No carotid bruits.  Abdomen: No distension, no tenderness, no masses palpated. No hepatosplenomegaly. Bowel sounds normal.  Musculoskeletal: no clubbing /  cyanosis. No joint deformity upper and lower extremities. Good ROM, no contractures. Normal muscle tone.  Skin: no rashes, lesions, ulcers. No induration Neurologic: CN 2-12 grossly intact. Sensation intact, DTR normal. Strength 5/5 in all 4 limbs.  Psychiatric: Normal judgment and insight. Alert and oriented x 3. Normal mood.     Labs on Admission: I have personally reviewed following labs and imaging studies  CBC:  Recent Labs Lab 03/07/16 1640  WBC 10.7*  HGB 5.6*  HCT 18.6*  MCV 90.7  PLT [id number]   Basic Metabolic Panel:  Recent Labs Lab 03/07/16 1640  NA 136  K 3.4*  CL 102  CO2 27  GLUCOSE 93  BUN 31*  CREATININE 1.14  CALCIUM 8.4*   GFR: CrCl cannot be calculated (Unknown ideal weight.). Liver Function Tests: No results for input(s): AST, ALT, ALKPHOS, BILITOT, PROT, ALBUMIN in the last 168 hours. No results for input(s): LIPASE, AMYLASE in the last 168 hours. No results for input(s): AMMONIA in the last 168 hours. Coagulation Profile: No results for input(s): INR, PROTIME in the last 168 hours. Cardiac Enzymes: No results for input(s): CKTOTAL, CKMB, CKMBINDEX, TROPONINI in the last 168 hours. BNP (last 3 results) No results for input(s): PROBNP in the last 8760 hours. HbA1C: No results for input(s): HGBA1C in the last 72 hours. CBG: No results for  input(s): GLUCAP in the last 168 hours. Lipid Profile: No results for input(s): CHOL, HDL, LDLCALC, TRIG, CHOLHDL, LDLDIRECT in the last 72 hours. Thyroid Function Tests: No results for input(s): TSH, T4TOTAL, FREET4, T3FREE, THYROIDAB in the last 72 hours. Anemia Panel: No results for input(s): VITAMINB12, FOLATE, FERRITIN, TIBC, IRON, RETICCTPCT in the last 72 hours. Urine analysis:    Component Value Date/Time   COLORURINE YELLOW 07/31/2014 1941   APPEARANCEUR CLEAR 07/31/2014 1941   LABSPEC 1.014 07/31/2014 1941   PHURINE 6.5 07/31/2014 1941   GLUCOSEU NEGATIVE 07/31/2014 1941   HGBUR NEGATIVE 07/31/2014 1941   BILIRUBINUR NEGATIVE 07/31/2014 1941   KETONESUR NEGATIVE 07/31/2014 1941   PROTEINUR NEGATIVE 07/31/2014 1941   UROBILINOGEN 0.2 07/31/2014 1941   NITRITE NEGATIVE 07/31/2014 1941   LEUKOCYTESUR NEGATIVE 07/31/2014 1941   Sepsis Labs: @LABRCNTIP (procalcitonin:4,lacticidven:4) )No results found for this or any previous visit (from the past 240 hour(s)).   Radiological Exams on Admission: No results found.   Assessment/Plan Principal Problem:   Anemia, iron deficiency - symptomatic with fatigue and dyspnea on exertion- no chest pain/ pressure at all - will transfuse 2 U PRBC - will also transfuse IV Iron in AM - f/u with Dr Alen Blew more frequently  Active Problems:     Edema extremities - chronic issue with exacerbation likely from anemia - elevate legs- cont Lasix  Mild hypokalemia - replace   Seizure disorder - Keppra and Lamictal  HTN - Avapro    OSA (obstructive sleep apnea) - CPAP ordered  Aortic insufficiency - followed by Dr Radford Pax   BPH - FLomax  RLS - Requip  NOTE: on a steroid taper by his PCP prescribed after his fall- down to 0.5 mg of Decadron daily   DVT prophylaxis: SCDs  Code Status: Full code  Family Communication: wife  Disposition Plan: home tomorrow  Consults called: none  Admission status: observation     [place] MD [facility] Pager: [email or web address] Password [email or web address] 7PM-7AM, please contact night-coverage   03/07/2016, 6:29 PM

## 2016-03-07 NOTE — ED Notes (Signed)
Critical Hgb received from lab 5.6 - Apolonio Schneiders B RN notified

## 2016-03-07 NOTE — ED Notes (Signed)
Per pt, states had labs done and was told to come to ED for low HgB

## 2016-03-07 NOTE — Progress Notes (Signed)
Nursing Note: First Unit of blood started.Pt was educated on signs of reaction to transfusion and to make Nurse aware immediately.Pt able to verbalize understanding per teach-back.wbb

## 2016-03-07 NOTE — Progress Notes (Signed)
Nursing Note: Blood to start now per on-call.No new orders.wbb

## 2016-03-08 DIAGNOSIS — D509 Iron deficiency anemia, unspecified: Secondary | ICD-10-CM | POA: Diagnosis not present

## 2016-03-08 DIAGNOSIS — E669 Obesity, unspecified: Secondary | ICD-10-CM

## 2016-03-08 DIAGNOSIS — G4733 Obstructive sleep apnea (adult) (pediatric): Secondary | ICD-10-CM | POA: Diagnosis not present

## 2016-03-08 DIAGNOSIS — R609 Edema, unspecified: Secondary | ICD-10-CM | POA: Diagnosis not present

## 2016-03-08 LAB — HEMOGLOBIN AND HEMATOCRIT, BLOOD
HCT: 23.5 % — ABNORMAL LOW (ref 39.0–52.0)
Hemoglobin: 7.4 g/dL — ABNORMAL LOW (ref 13.0–17.0)

## 2016-03-08 LAB — COMPREHENSIVE METABOLIC PANEL
ALT: 26 U/L (ref 17–63)
AST: 31 U/L (ref 15–41)
Albumin: 3.2 g/dL — ABNORMAL LOW (ref 3.5–5.0)
Alkaline Phosphatase: 41 U/L (ref 38–126)
Anion gap: 7 (ref 5–15)
BUN: 31 mg/dL — AB (ref 6–20)
CHLORIDE: 102 mmol/L (ref 101–111)
CO2: 27 mmol/L (ref 22–32)
CREATININE: 1.07 mg/dL (ref 0.61–1.24)
Calcium: 8.1 mg/dL — ABNORMAL LOW (ref 8.9–10.3)
GFR calc Af Amer: >60 mL/min (ref >60)
Glucose, Bld: 98 mg/dL (ref 65–99)
Potassium: 4.2 mmol/L (ref 3.5–5.1)
SODIUM: 136 mmol/L (ref 135–145)
Total Bilirubin: 0.8 mg/dL (ref 0.3–1.2)
Total Protein: 5.9 g/dL — ABNORMAL LOW (ref 6.5–8.1)

## 2016-03-08 LAB — PREPARE RBC (CROSSMATCH)

## 2016-03-08 MED ORDER — SODIUM CHLORIDE 0.9 % IV BOLUS (SEPSIS)
500.0000 mL | Freq: Once | INTRAVENOUS | Status: AC
  Administered 2016-03-08: 500 mL via INTRAVENOUS

## 2016-03-08 MED ORDER — DEXAMETHASONE 1 MG PO TABS: 0.5000 mg | ORAL_TABLET | Freq: Every day | ORAL | Status: DC

## 2016-03-08 MED ORDER — SODIUM CHLORIDE 0.9 % IV SOLN
Freq: Once | INTRAVENOUS | Status: AC
  Administered 2016-03-08: 14:00:00 via INTRAVENOUS

## 2016-03-08 NOTE — Progress Notes (Signed)
Nursing Note: Orders received to check BP manual and notify if <90 syst.A: Manual BP =82/50 P-76.No distress noted,resting quietly in bed.A: Paged on-call and made aware of BP and P.wbb

## 2016-03-08 NOTE — Progress Notes (Addendum)
Nursing Note: Pt resting quietly.2nd unit of blood in.Pt up to void as he received 40 lasix IV per orders.Bp-89/55 .On-call paged ans made aware of BP.Pt resting quietly and denies any distress.Pt has occasional whezzing when up to bscPt says this is not new,he does this at home and tells his doctor but he never hears it.Lungs are clear,pt wheezes after up to stand to void.wbb

## 2016-03-08 NOTE — Progress Notes (Signed)
Nursing Note: IV bolus ordered and infusing.wbb

## 2016-03-08 NOTE — Discharge Summary (Signed)
Physician Discharge Summary  [name] [id number] DOB: 1931-02-28 DOA: 03/07/2016  PCP:  Melinda Crutch, MD  Admit date: 03/07/2016 Discharge date: 03/08/2016  Time spent: 60 minutes  Recommendations for Outpatient Follow-up:  1. F/u with Dr Alen Blew  Discharge Condition: stable    Discharge Diagnoses:  Principal Problem:   Anemia, iron deficiency Active Problems:   CKD (chronic kidney disease), stage III   Edema extremities   OSA (obstructive sleep apnea)   Obesity (BMI 30-39.9)   Aortic insufficiency   History of present illness:  [name] is a 82 y.o. male with medical history significant of iron deficiency anemia, GERD, HTN, OSA, seizure disroder and Aortic insufficiency presents with Hb of 5. He went to see his PCP and mentioned that he felt fatigued. He has had dyspnea on exertion as well. No chest pain. Hie PCP checked his blood work and recommended he go to the hospital for a transfusion. He was last seen by Dr Alen Blew this past Feb and received an Iron infusion. He was due to go again in April for another transfusion but fell and had to go to SNF for rehab. He has not been able to make another appointment. He takes his oral Iron as prescribed. Extensive w/u for his anemia in the past has not found a resolvable etiology. No blood noted in stools.   Hospital Course:  Principal Problem:  Anemia, iron deficiency - symptomatic with fatigue and dyspnea on exertion- no chest pain/ pressure at all - Hb 7.4 after 2 U PRBC- transfused 1 more unit  -  transfused IV Iron (Ferrlicit) - f/u with Dr Alen Blew more frequently for Iron infusions in order to prevent need for blood transfusions  Active Problems:   Edema extremities - chronic issue with exacerbation likely from anemia - elevate legs- cont Lasix as outpt- much better today  Mild hypokalemia - replaced  Seizure disorder - Keppra and Lamictal  HTN - Avapro  OSA (obstructive sleep apnea) - CPAP ordered  Aortic  insufficiency - followed by Dr Radford Pax   BPH - FLomax  RLS - Requip  NOTE: on a steroid taper by his PCP prescribed after his fall- down to 0.5 mg of Decadron daily   Discharge Exam: Filed Weights   03/07/16 1911  Weight: 96.163 kg (212 lb)   Filed Vitals:   03/08/16 1421 03/08/16 1616  BP: 110/55 134/59  Pulse: 84 85  Temp: 98.3 F (36.8 C) 97.7 F (36.5 C)  Resp: 18 18    General: AAO x 3, no distress Cardiovascular: RRR, no murmurs  Respiratory: clear to auscultation bilaterally GI: soft, non-tender, non-distended, bowel sound positive  Discharge Instructions You were cared for by a hospitalist during your hospital stay. If you have any questions about your discharge medications or the care you received while you were in the hospital after you are discharged, you can call the unit and asked to speak with the hospitalist on call if the hospitalist that took care of you is not available. Once you are discharged, your primary care physician will handle any further medical issues. Please note that NO REFILLS for any discharge medications will be authorized once you are discharged, as it is imperative that you return to your primary care physician (or establish a relationship with a primary care physician if you do not have one) for your aftercare needs so that they can reassess your need for medications and monitor your lab values.      Discharge Instructions  Diet - low sodium heart healthy    Complete by:  As directed      Increase activity slowly    Complete by:  As directed             Medication List    TAKE these medications        acetaminophen 500 MG tablet  Commonly known as:  TYLENOL  Take 1,000 mg by mouth every 8 (eight) hours as needed (pain).     b complex vitamins tablet  Take 1 tablet by mouth daily.     dexamethasone 1 MG tablet  Commonly known as:  DECADRON  Take 0.5 tablets (0.5 mg total) by mouth daily. Stop after 2 days     famotidine  20 MG tablet  Commonly known as:  PEPCID  Take 20 mg by mouth at bedtime.     ferrous sulfate 325 (65 FE) MG tablet  Take 1,300 mg by mouth every morning.     furosemide 20 MG tablet  Commonly known as:  LASIX  Take 20 mg by mouth daily.     lamoTRIgine 25 MG tablet  Commonly known as:  LAMICTAL  Take 3 tablets twice a day for 1 week, then increase to 4 tablets twice a day and continue.     levETIRAcetam 500 MG tablet  Commonly known as:  KEPPRA  Take 1 & 1/2 tablets twice a day     magnesium gluconate 500 MG tablet  Commonly known as:  MAGONATE  Take 500 mg by mouth daily.     multivitamin-lutein Caps capsule  Take 1 capsule by mouth daily.     oxyCODONE 5 MG immediate release tablet  Commonly known as:  Oxy IR/ROXICODONE  Take 1 tablet (5 mg total) by mouth every 4 (four) hours as needed for moderate pain.     PROBIOTIC DAILY PO  Take 1 capsule by mouth daily.     rOPINIRole 3 MG tablet  Commonly known as:  REQUIP  Take 1.5-3 mg by mouth daily. Take 3mg s at bedtime.  May take 1.5mg s during the day for restless legs then take the other 1.5mg s at bedtime (no more than 3 mgs daily)     solifenacin 5 MG tablet  Commonly known as:  VESICARE  Take 1 tablet (5 mg total) by mouth daily.     tamsulosin 0.4 MG Caps capsule  Commonly known as:  FLOMAX  Take 1 capsule (0.4 mg total) by mouth daily after supper.     valsartan 160 MG tablet  Commonly known as:  DIOVAN  Take 160 mg by mouth daily.     vitamin B-12 100 MCG tablet  Commonly known as:  CYANOCOBALAMIN  Take 100 mcg by mouth daily.       Allergies  Allergen Reactions  . Asa [Aspirin] Nausea Only  . Other Itching and Rash    Nuts  . Vicodin [Hydrocodone-Acetaminophen] Nausea Only      The results of significant diagnostics from this hospitalization (including imaging, microbiology, ancillary and laboratory) are listed below for reference.    Significant Diagnostic Studies: No results  found.  Microbiology: No results found for this or any previous visit (from the past 240 hour(s)).   Labs: Basic Metabolic Panel:  Recent Labs Lab 03/07/16 1640 03/08/16 0349  NA 136 136  K 3.4* 4.2  CL 102 102  CO2 27 27  GLUCOSE 93 98  BUN 31* 31*  CREATININE 1.14 1.07  CALCIUM 8.4* 8.1*  Liver Function Tests:  Recent Labs Lab 03/08/16 0349  AST 31  ALT 26  ALKPHOS 41  BILITOT 0.8  PROT 5.9*  ALBUMIN 3.2*   No results for input(s): LIPASE, AMYLASE in the last 168 hours. No results for input(s): AMMONIA in the last 168 hours. CBC:  Recent Labs Lab 03/07/16 1640 03/08/16 0349  WBC 10.7*  --   HGB 5.6* 7.4*  HCT 18.6* 23.5*  MCV 90.7  --   PLT 356  --    Cardiac Enzymes: No results for input(s): CKTOTAL, CKMB, CKMBINDEX, TROPONINI in the last 168 hours. BNP: BNP (last 3 results) No results for input(s): BNP in the last 8760 hours.  ProBNP (last 3 results) No results for input(s): PROBNP in the last 8760 hours.  CBG: No results for input(s): GLUCAP in the last 168 hours.     SignedDebbe Odea, MD [facility] 03/08/2016, 5:58 PM

## 2016-03-08 NOTE — Care Management Obs Status (Signed)
[place] NOTIFICATION   Patient Details  Name: [name] MRN: [id number] Date of Birth: 1930-12-07   Medicare Observation Status Notification Given:  Yes    Lynnell Catalan, RN 03/08/2016, 12:58 PM

## 2016-03-09 ENCOUNTER — Telehealth: Payer: Self-pay | Admitting: *Deleted

## 2016-03-09 ENCOUNTER — Other Ambulatory Visit: Payer: Self-pay | Admitting: *Deleted

## 2016-03-09 DIAGNOSIS — C7931 Secondary malignant neoplasm of brain: Secondary | ICD-10-CM

## 2016-03-09 DIAGNOSIS — C7949 Secondary malignant neoplasm of other parts of nervous system: Principal | ICD-10-CM

## 2016-03-09 LAB — TYPE AND SCREEN
ABO/RH(D): O POS
ANTIBODY SCREEN: NEGATIVE
Unit division: 0
Unit division: 0
Unit division: 0

## 2016-03-09 NOTE — Telephone Encounter (Signed)
1.  Do you need a wheel chair?  WALKER  2. On oxygen? NO  3. Have you ever had any surgery in the body part being scanned?  Yes Craniotomy on 09/27/13 and then again on 02/17/15 by Dr. Kathyrn Sheriff to remove a meningioma  4. Have you ever had any surgery on your brain or heart?           YES BRAIN            5. Have you ever had surgery on your eyes or ears?       NO                       6. Do you have a pacemaker or defibrillator?  NO  7. Do you have a Neurostimulator?  NO  8. Claustrophobic?  NO  9. Any risk for metal in eyes? NO  10. Injury by bullet, buckshot, or shrapnel?  NO  11. Stent?  NO                                                                                                                12. Hx of Cancer?   13. Kidney or Liver disease?  NO  14. Hx of Lupus, Rheumatoid Arthritis or Scleroderma? NO  15. IV Antibiotics or long term use of NSAIDS?   NO  16. HX of Hypertension?  NO  17. Diabetes?  NO  18. Allergy to contrast?  NO  19. Recent labs.  IN EPIC

## 2016-03-10 ENCOUNTER — Telehealth: Payer: Self-pay | Admitting: Oncology

## 2016-03-10 NOTE — Telephone Encounter (Signed)
returned call and s.w. pt and gv appt....pt ok and aware

## 2016-03-21 ENCOUNTER — Ambulatory Visit
Admission: RE | Admit: 2016-03-21 | Discharge: 2016-03-21 | Disposition: A | Discharge status: Home / Self Care | Payer: Medicare Other | Source: Ambulatory Visit | Attending: Radiation Oncology | Admitting: Radiation Oncology

## 2016-03-21 ENCOUNTER — Telehealth: Payer: Self-pay | Admitting: Radiation Oncology

## 2016-03-21 DIAGNOSIS — C7949 Secondary malignant neoplasm of other parts of nervous system: Principal | ICD-10-CM

## 2016-03-21 DIAGNOSIS — C7931 Secondary malignant neoplasm of brain: Secondary | ICD-10-CM

## 2016-03-21 MED ORDER — GADOBENATE DIMEGLUMINE 529 MG/ML IV SOLN
20.0000 mL | Freq: Once | INTRAVENOUS | Status: AC | PRN
  Administered 2016-03-21: 20 mL via INTRAVENOUS

## 2016-03-21 NOTE — Telephone Encounter (Signed)
Received call from patient's wife, [name], concerned about "getting [name] to his follow up appointment with Dr. Tammi Klippel on 7/10." She states, "it took five of [place] to get him in the car to go for his MRI." She reports he is so weak he can't stand on his own. She verbalized that they need advise about what to do next in way of declining status and meeting care needs. She understands this RN will inform Dr. Tammi Klippel, Bryson Ha and Manuela Schwartz of these findings then, phone her back on Wednesday, July 5th with further directions. She verbalized understanding.

## 2016-03-23 ENCOUNTER — Telehealth: Payer: Self-pay | Admitting: Radiation Oncology

## 2016-03-23 MED ORDER — DEXAMETHASONE 4 MG PO TABS: 4.0000 mg | ORAL_TABLET | Freq: Two times a day (BID) | ORAL | Status: DC

## 2016-03-23 NOTE — Telephone Encounter (Signed)
Phoned patient's home to inquire about status. Spoke with patient's wife. She reports her husband fell this morning so she called 911. She reports he sustained no injury from the fall and the medics advised her husband to follow up with his doctor. She is concerned about his continued decline. Stressed his fall and weak status could be related to his anemia and a follow up with his PCP or Dr. Alen Blew is imperative and should occur as soon as possible. She verbalized understanding. She expressed her intention to speak with her husband and arrange an appointment or call 911 back to transport him to the emergency room. She confirmed their intentions to be present for follow up appointment with Dr. Tammi Klippel on Monday unless he is hospitalized before then. Will continue to check in on the patient's status.

## 2016-03-23 NOTE — Telephone Encounter (Signed)
Sam,  Have you heard back from Dr. Tammi Klippel? If not we need to ru nby Dr. Lisbeth Renshaw. Do you know if he is on any steroids?

## 2016-03-23 NOTE — Telephone Encounter (Signed)
I called again and discussed the findings with the patient's wife and our concerns for needing dexamethasone after reviewing his imaging with Dr. Lisbeth Renshaw. A new prescription is present for 4mg  BID of dexamethasone.

## 2016-03-24 ENCOUNTER — Telehealth: Payer: Self-pay | Admitting: Radiation Oncology

## 2016-03-24 DIAGNOSIS — R5381 Other malaise: Secondary | ICD-10-CM

## 2016-03-24 DIAGNOSIS — D329 Benign neoplasm of meninges, unspecified: Secondary | ICD-10-CM

## 2016-03-24 NOTE — Telephone Encounter (Signed)
Phoned patient's wife to inquire about her husband's status. She confirms picking up the decadron. She goes onto say her husband began taking it this morning. She confirms they plan to keep Monday, July 10th appointments with Dr. Tammi Klippel and Dr. Alen Blew. She verbalized a need for various equipment in the home. Wife understands this RN will place an [facility] referral and to contact this RN with future needs.

## 2016-03-28 ENCOUNTER — Encounter: Payer: Self-pay | Admitting: Radiation Oncology

## 2016-03-28 ENCOUNTER — Telehealth: Payer: Self-pay | Admitting: *Deleted

## 2016-03-28 ENCOUNTER — Ambulatory Visit (HOSPITAL_BASED_OUTPATIENT_CLINIC_OR_DEPARTMENT_OTHER): Payer: Medicare Other | Admitting: Oncology

## 2016-03-28 ENCOUNTER — Telehealth: Payer: Self-pay | Admitting: Oncology

## 2016-03-28 ENCOUNTER — Telehealth: Payer: Self-pay | Admitting: Radiation Oncology

## 2016-03-28 ENCOUNTER — Ambulatory Visit (HOSPITAL_BASED_OUTPATIENT_CLINIC_OR_DEPARTMENT_OTHER): Payer: Medicare Other

## 2016-03-28 ENCOUNTER — Ambulatory Visit
Admission: RE | Admit: 2016-03-28 | Discharge: 2016-03-28 | Disposition: A | Discharge status: Home / Self Care | Payer: Medicare Other | Source: Ambulatory Visit | Attending: Radiation Oncology | Admitting: Radiation Oncology

## 2016-03-28 VITALS — BP 131/62 | HR 81 | Temp 97.8°F | Resp 18 | Wt 200.0 lb

## 2016-03-28 VITALS — BP 119/86 | HR 79 | Temp 98.0°F | Resp 18 | Wt 200.0 lb

## 2016-03-28 DIAGNOSIS — D509 Iron deficiency anemia, unspecified: Secondary | ICD-10-CM

## 2016-03-28 DIAGNOSIS — D329 Benign neoplasm of meninges, unspecified: Secondary | ICD-10-CM | POA: Diagnosis not present

## 2016-03-28 DIAGNOSIS — R531 Weakness: Secondary | ICD-10-CM | POA: Insufficient documentation

## 2016-03-28 DIAGNOSIS — Z9181 History of falling: Secondary | ICD-10-CM | POA: Insufficient documentation

## 2016-03-28 DIAGNOSIS — D420 Neoplasm of uncertain behavior of cerebral meninges: Secondary | ICD-10-CM

## 2016-03-28 DIAGNOSIS — Z8546 Personal history of malignant neoplasm of prostate: Secondary | ICD-10-CM | POA: Diagnosis not present

## 2016-03-28 LAB — CBC WITH DIFFERENTIAL/PLATELET
BASO%: 0.3 % (ref 0.0–2.0)
BASOS ABS: 0 10*3/uL (ref 0.0–0.1)
EOS ABS: 0 10*3/uL (ref 0.0–0.5)
EOS%: 0 % (ref 0.0–7.0)
HEMATOCRIT: 34.6 % — AB (ref 38.4–49.9)
HEMOGLOBIN: 10.9 g/dL — AB (ref 13.0–17.1)
LYMPH%: 8 % — ABNORMAL LOW (ref 14.0–49.0)
MCH: 26.7 pg — AB (ref 27.2–33.4)
MCHC: 31.4 g/dL — ABNORMAL LOW (ref 32.0–36.0)
MCV: 85 fL (ref 79.3–98.0)
MONO#: 0.3 10*3/uL (ref 0.1–0.9)
MONO%: 3.5 % (ref 0.0–14.0)
NEUT#: 7.2 10*3/uL — ABNORMAL HIGH (ref 1.5–6.5)
NEUT%: 88.2 % — ABNORMAL HIGH (ref 39.0–75.0)
PLATELETS: 591 10*3/uL — AB (ref 140–400)
RBC: 4.07 10*6/uL — ABNORMAL LOW (ref 4.20–5.82)
RDW: 19.6 % — AB (ref 11.0–14.6)
WBC: 8.1 10*3/uL (ref 4.0–10.3)
lymph#: 0.7 10*3/uL — ABNORMAL LOW (ref 0.9–3.3)

## 2016-03-28 LAB — IRON AND TIBC
%SAT: 5 % — AB (ref 20–55)
IRON: 22 ug/dL — AB (ref 42–163)
TIBC: 404 ug/dL (ref 202–409)
UIBC: 382 ug/dL — ABNORMAL HIGH (ref 117–376)

## 2016-03-28 LAB — FERRITIN: FERRITIN: 90 ng/mL (ref 22–316)

## 2016-03-28 MED ORDER — MAGIC MOUTHWASH: 15.0000 mL | Freq: Three times a day (TID) | ORAL | Status: DC

## 2016-03-28 NOTE — Telephone Encounter (Signed)
Faxed outpatient palliative care referral form with last office noted to [phone]. Fax confirmation of deliver obtained.

## 2016-03-28 NOTE — Addendum Note (Signed)
Encounter addended by: Heywood Footman, RN on: 03/28/2016  3:17 PM<BR>     Documentation filed: Charges VN

## 2016-03-28 NOTE — Telephone Encounter (Signed)
per pfo to sch pt appt-gave pt copy of avs-sent back to lab- °

## 2016-03-28 NOTE — Progress Notes (Signed)
Hematology and Oncology Follow Up Visit  [name] [id number] [birth year] 82 y.o. 03/28/2016 9:42 AM  [name], MDRoss, Paul Crumble, MD   Principle Diagnosis: 82 year old gentleman with iron deficiency anemia diagnosed in February 2016. Presented with hemoglobin of 8.8, RDW of 16.9 and ferritin of 14.8. His GI workup so far has been unrevealing.   Prior Therapy: Status post IV iron for a total of 1 g given in March 2016. This was repeated in January 2017.  Current therapy: Oral iron supplements in the form of iron sulfate for a total of 1300 mg daily.  Interim History: Mr. [name] presents today for a follow-up visit. Since the last visit, he was hospitalized in June 2017 for worsening anemia and found to be anemic at 5.7. He received packed red cell transfusion as well as IV iron. He noticed improvement in his energy but continues to have issues with ambulation. He still uses a walker at times but does have falls and tripping at times. Has not reported any dyspepsia, constipation or abdominal pain. He has not reported any GI complaints. He has not reported any hematochezia or melena. Does not report any hemoptysis or hematemesis.  He does not report any headaches, blurry vision, double vision or syncope or seizures. He does not report any chest pain, palpitation or leg edema or orthopnea. He does report wheezing and exertional dyspnea but no cough or hemoptysis. He does not report any nausea, vomiting, abdominal pain, early satiety. He does not report any frequency, urgency or hesitancy. He does not report any skeletal complaints. Rest of his review of systems unremarkable.   Medications: I have reviewed the patient's current medications.  Current Outpatient Prescriptions  Medication Sig Dispense Refill  . acetaminophen (TYLENOL) 500 MG tablet Take 1,000 mg by mouth every 8 (eight) hours as needed (pain).    Marland Kitchen b complex vitamins tablet Take 1 tablet by mouth daily.    Marland Kitchen dexamethasone (DECADRON) 4  MG tablet Take 1 tablet (4 mg total) by mouth 2 (two) times daily. 60 tablet 1  . famotidine (PEPCID) 20 MG tablet Take 20 mg by mouth at bedtime.    . ferrous sulfate 325 (65 FE) MG tablet Take 1,300 mg by mouth every morning.     . furosemide (LASIX) 20 MG tablet Take 20 mg by mouth daily.  0  . lamoTRIgine (LAMICTAL) 25 MG tablet Take 4 tablets by mouth 2 (two) times daily.  3  . levETIRAcetam (KEPPRA) 500 MG tablet Take 1 & 1/2 tablets twice a day 90 tablet 6  . magnesium gluconate (MAGONATE) 500 MG tablet Take 500 mg by mouth daily.    . multivitamin-lutein (OCUVITE-LUTEIN) CAPS capsule Take 1 capsule by mouth daily.    Marland Kitchen oxyCODONE (OXY IR/ROXICODONE) 5 MG immediate release tablet Take 1 tablet (5 mg total) by mouth every 4 (four) hours as needed for moderate pain. 30 tablet 0  . Probiotic Product (PROBIOTIC DAILY PO) Take 1 capsule by mouth daily.     Marland Kitchen rOPINIRole (REQUIP) 3 MG tablet Take 1.5-3 mg by mouth daily. Take 3mg s at bedtime.  May take 1.5mg s during the day for restless legs then take the other 1.5mg s at bedtime (no more than 3 mgs daily)  3  . solifenacin (VESICARE) 5 MG tablet Take 1 tablet (5 mg total) by mouth daily. 30 tablet 1  . tamsulosin (FLOMAX) 0.4 MG CAPS capsule Take 1 capsule (0.4 mg total) by mouth daily after supper. 30 capsule 1  . valsartan (  DIOVAN) 160 MG tablet Take 160 mg by mouth daily.    . vitamin B-12 (CYANOCOBALAMIN) 100 MCG tablet Take 100 mcg by mouth daily.      No current facility-administered medications for this visit.     Allergies:  Allergies  Allergen Reactions  . Asa [Aspirin] Nausea Only  . Other Itching and Rash    Nuts  . Vicodin [Hydrocodone-Acetaminophen] Nausea Only    Past Medical History, Surgical history, Social history, and Family History were reviewed and updated.   Physical Exam: Blood pressure 119/86, pulse 79, temperature 98 F (36.7 C), temperature source Oral, resp. rate 18, weight 200 lb (90.719 kg), SpO2 100  %. ECOG: 1 General appearance: Pleasant-appearing gentleman appeared without distress. Head: Normocephalic, without obvious abnormality scalp showed no induration or masses. Neck: no adenopathy Lymph nodes: Cervical, supraclavicular, and axillary nodes normal. Heart:regular rate and rhythm, S1, S2 normal, no murmur, click, rub or gallop Lung:chest clear, no wheezing, rales, normal symmetric air entry Abdomin: soft, non-tender, without masses or organomegaly no rebound or guarding. EXT:no erythema, induration, or nodules   Lab Results: Lab Results  Component Value Date   WBC 10.7* 03/07/2016   HGB 7.4* 03/08/2016   HCT 23.5* 03/08/2016   MCV 90.7 03/07/2016   PLT 356 03/07/2016     Chemistry      Component Value Date/Time   NA 136 03/08/2016 0349   NA 139 11/25/2014 1414   K 4.2 03/08/2016 0349   K 3.8 11/25/2014 1414   CL 102 03/08/2016 0349   CO2 27 03/08/2016 0349   CO2 23 11/25/2014 1414   BUN 31* 03/08/2016 0349   BUN 22.7 05/14/2015 0959   CREATININE 1.07 03/08/2016 0349   CREATININE 1.1 05/14/2015 0959      Component Value Date/Time   CALCIUM 8.1* 03/08/2016 0349   CALCIUM 9.2 11/25/2014 1414   ALKPHOS 41 03/08/2016 0349   ALKPHOS 56 11/25/2014 1414   AST 31 03/08/2016 0349   AST 16 11/25/2014 1414   ALT 26 03/08/2016 0349   ALT 10 11/25/2014 1414   BILITOT 0.8 03/08/2016 0349   BILITOT 0.24 11/25/2014 1414         Impression and Plan:    82 year old gentleman with the following issues:  1. Iron deficiency anemia presenting with a hemoglobin of 8.8, RDW of 16.9 and ferritin level 14.8 obtained in February 2016. The etiology is unclear and his GI workup is ongoing.   He is status post IV iron in the form of Feraheme for a total of 1 g completed in March 2016. He was repeated in January 2017.  He received packed red cell transfusion as well as IV iron in June 2017. The plan is to repeat his hemoglobin and iron studies today and we will monitor him  closely on a monthly basis to ensure adequate iron stores and no further drop in his hemoglobin. His iron deficiency is related to poor absorption of oral iron despite his aggressive oral replacement therapy. I've asked him to continue with oral iron in conjunction with vitamin C and to avoid antiacids and PPIs. IV iron will be needed periodically to ensure adequate iron stores.   2. Prostate cancer: Followed by Dr. Junious Silk. No evidence of recurrence.  3. Meningioma: Status post surgical resection followed by radiation therapy. Followed by neurosurgery and radiation oncology.  4. Follow-up: In 1 month to repeat iron studies.   [facility], MD 7/10/20179:42 AM

## 2016-03-28 NOTE — Telephone Encounter (Signed)
Magic mouth wash script printed instead of escribing directly to [facility] on [address]. This RN called in Magic mouthwash solution, 15 cc tid swish and spit, 480 cc, and no refills. Solution clarification requested by Sh[facility] at [facility]. The following provided: 80 cc each of extra st. maalox plus, diphenhydramine, and nystatin plus 240 cc of 2% viscous lidocaine.

## 2016-03-28 NOTE — Progress Notes (Signed)
Radiation Oncology         [phone] ________________________________  Name: [name] MRN: [id number]  Date: 03/28/2016  DOB: 1931/06/14  Follow-Up Visit Note  CC:  Melinda Crutch, MD  Consuella Lose, MD  Diagnosis:   The patient is an 82 y.o.  gentleman presenting to the clinic in regards to his recurrent grade II meningioma of the right frontoparietal convexity s/p resection followed by salvage fractionated stereotactic radiotherapy to 25 Gy in 5 fractions of 5 Gr through 05/29/2014 followed by salvage resection with new recurrence. The right front tumor was treated with 54 Gy in 30 fractions of 1.8 Gy from 06/15/2015-07/23/2015.  Interval Since Last Radiation: 8 months.  06/15/2015-07/23/2015: right front tumor was treated with 54 Gy in 30 fractions of 1.8 Gy   05/29/14: postop SRS to 25 Gy right frontoparietal convexity in 5 fractions  Narrative:  The patient returns today for routine follow-up and discussion of his recent MRI. He has a history of recurrent meningioma and did undergo resection on 02/17/16, and prior to that on 09/27/13. His radiation treatments are outlined above. He has been having trouble with weakness and several falls at home. He went for his MRI on 03/21/16 which revealed more enhancement and nodularity in his most recent resection cavity.   On review of systems, the patient reports 3-4 falls at home in the last month. He attributes falls to "legs giving out without warning" and describes that he can feel the weakness start towards his left foot and goes up his left leg before he falls. He went to physical therapy at [facility] and he believes he was getting better. He uses a cane or walker normally, but presents today in a wheelchair. The patient and his wife note an overall improvement in the weakness since beginning decadron 4 mg bid on 03/23/16. He denies headaches, dizziness, nausea or vomiting, or diplopia. He continues with chronic ringing in his left  ear.  ALLERGIES:  is allergic to asa; other; and vicodin.  Meds: Current Outpatient Prescriptions  Medication Sig Dispense Refill  . acetaminophen (TYLENOL) 500 MG tablet Take 1,000 mg by mouth every 8 (eight) hours as needed (pain).    Marland Kitchen b complex vitamins tablet Take 1 tablet by mouth daily.    Marland Kitchen dexamethasone (DECADRON) 4 MG tablet Take 1 tablet (4 mg total) by mouth 2 (two) times daily. 60 tablet 1  . doxycycline (VIBRA-TABS) 100 MG tablet Take 100 mg by mouth every 12 (twelve) hours.  0  . famotidine (PEPCID) 20 MG tablet Take 20 mg by mouth at bedtime.    . ferrous sulfate 325 (65 FE) MG tablet Take 1,300 mg by mouth every morning.     . furosemide (LASIX) 20 MG tablet Take 20 mg by mouth daily.  0  . lamoTRIgine (LAMICTAL) 25 MG tablet Take 3 tablets twice a day for 1 week, then increase to 4 tablets twice a day and continue. (Patient taking differently: Take 100 mg by mouth 2 (two) times daily. ) 240 tablet 5  . levETIRAcetam (KEPPRA) 500 MG tablet Take 1 & 1/2 tablets twice a day 90 tablet 6  . magnesium gluconate (MAGONATE) 500 MG tablet Take 500 mg by mouth daily.    . multivitamin-lutein (OCUVITE-LUTEIN) CAPS capsule Take 1 capsule by mouth daily.    . Probiotic Product (PROBIOTIC DAILY PO) Take 1 capsule by mouth daily.     Marland Kitchen rOPINIRole (REQUIP) 3 MG tablet Take 1.5-3 mg by mouth daily. Take  3mg s at bedtime.  May take 1.5mg s during the day for restless legs then take the other 1.5mg s at bedtime (no more than 3 mgs daily)  3  . solifenacin (VESICARE) 5 MG tablet Take 1 tablet (5 mg total) by mouth daily. 30 tablet 1  . tamsulosin (FLOMAX) 0.4 MG CAPS capsule Take 1 capsule (0.4 mg total) by mouth daily after supper. 30 capsule 1  . valsartan (DIOVAN) 160 MG tablet Take 160 mg by mouth daily.    . vitamin B-12 (CYANOCOBALAMIN) 100 MCG tablet Take 100 mcg by mouth daily.     Marland Kitchen oxyCODONE (OXY IR/ROXICODONE) 5 MG immediate release tablet Take 1 tablet (5 mg total) by mouth every 4  (four) hours as needed for moderate pain. (Patient not taking: Reported on 03/28/2016) 30 tablet 0   No current facility-administered medications for this encounter.    Physical Findings:  weight is 200 lb (90.719 kg). His oral temperature is 97.8 F (36.6 C). His blood pressure is 131/62 and his pulse is 81. His respiration is 18 and oxygen saturation is 96%.   In general this is a well appearing Caucasian male in no acute distress. He's alert and oriented x4 and appropriate throughout the examination. Cardiopulmonary assessment is negative for acute distress and he exhibits normal effort. HEENT reveals normocephalic atraumatic, EOMs are intact. Oropharynx is intact with white plaques consistent with rash. No evidence of ulcerations are noted. His uvula is midline, and he does not appear to have any cranial nerve palsy during discussion and examining him.   Lab Findings: Lab Results  Component Value Date   WBC 10.7* 03/07/2016   WBC 8.8 10/30/2015   HGB 7.4* 03/08/2016   HGB 14.6 10/30/2015   HCT 23.5* 03/08/2016   HCT 45.6 10/30/2015   PLT 356 03/07/2016   PLT 230 10/30/2015    Lab Results  Component Value Date   NA 136 03/08/2016   NA 139 11/25/2014   K 4.2 03/08/2016   K 3.8 11/25/2014   CHLORIDE 106 11/25/2014   CO2 27 03/08/2016   CO2 23 11/25/2014   GLUCOSE 98 03/08/2016   GLUCOSE 115 11/25/2014   BUN 31* 03/08/2016   BUN 22.7 05/14/2015   CREATININE 1.07 03/08/2016   CREATININE 1.1 05/14/2015   BILITOT 0.8 03/08/2016   BILITOT 0.24 11/25/2014   ALKPHOS 41 03/08/2016   ALKPHOS 56 11/25/2014   AST 31 03/08/2016   AST 16 11/25/2014   ALT 26 03/08/2016   ALT 10 11/25/2014   PROT 5.9* 03/08/2016   PROT 6.6 11/25/2014   ALBUMIN 3.2* 03/08/2016   ALBUMIN 3.4* 11/25/2014   CALCIUM 8.1* 03/08/2016   CALCIUM 9.2 11/25/2014   ANIONGAP 7 03/08/2016   ANIONGAP 10 11/25/2014    Radiographic Findings: Mr Jeri Cos Wo Contrast  03/21/2016  ADDENDUM REPORT: 03/21/2016  15:29 ADDENDUM: Study discussed by telephone with Dr. Rodman Key Krisa Blattner on 03/21/2016 at 1512 hours. Electronically Signed   By: Genevie Ann M.D.   On: 03/21/2016 15:29  03/21/2016  CLINICAL DATA:  82 year old male with right cerebral convexity grade 2 meningioma status post resection x2 and radiotherapy x2, most recently 54 Gy completed in November 2016. Left lower extremity weakness and numbness. Unable to walk. Restaging. Subsequent encounter. EXAM: MRI HEAD WITHOUT AND WITH CONTRAST TECHNIQUE: Multiplanar, multiecho pulse sequences of the brain and surrounding structures were obtained without and with intravenous contrast. CONTRAST:  72mL MULTIHANCE GADOBENATE DIMEGLUMINE 529 MG/ML IV SOLN COMPARISON:  01/01/2016 and earlier. FINDINGS: Postoperative changes  re- identified with extensive chronic en plaque meningioma over the right cerebral convexity. Today axial images differ mildly in angulation, but overall the residual nodular enhancement appears mildly progressed since April. This is most noticeable in the posterior nodular component described previously to which attention was directed. See series 11, image 93, and compare to series 11, image 38 previously. Roughly that component has increased from 12 mm thickness to about 17 mm. The anterior most nodular component has a more stable appearance. No completely new areas of enhancement are identified. Associated progression of vasogenic edema in the posterior right hemisphere, with increased edema in the inferior right parietal lobe and new edema in the superior right occipital lobe corresponding to the progressed posterior component. The posterior right frontal lobe and parietal lobe T2 and FLAIR hyperintensity otherwise is stable. The postoperative extra-axial fluid collection is stable measuring up to 9 mm in thickness. Chronic but increased occlusion of the superior sagittal sinus as demonstrated on series 11, image 74 (versus series 11, image 33 previously) and  image 63 (versus image 29 previously). Other Major intracranial vascular flow voids are stable, with chronic occlusion of the more cephalad superior sagittal sinus as before. The left transverse and sigmoid sinuses are dominant and patent. Apparent new posterior left hemisphere extra-axial collection might instead reflect interval dural venous engorgement (series 7 images 12 through 19). Otherwise no acute or interval intracranial hemorrhage. Stable ventricle size and configuration. Patent basilar cisterns. No restricted diffusion or evidence of acute infarction. Negative pituitary, cervicomedullary junction visualized cervical spine. Stable signal in the left hemisphere, brainstem, and cerebellum. Stable bone marrow signal. However, coronal T2 weighted images suggest a new T2 hyperintense deep scalp fluid collection since April best seen on series 10, image 27 (compared is series 10, image 17 previously). Other scalp soft tissues appear stable. Stable orbits soft tissues. Increased paranasal sinus mucosal thickening. Trace mastoid fluid is stable. IMPRESSION: 1. 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 New right occipital lobe vasogenic edema. 2. Evidence of new small left posterior convexity subdural hematoma or collection as well as bilateral deep scalp soft tissue fluid collections - or perhaps alternatively these relate to interval venous congestion from #1. 3. No new or increasing intracranial mass effect. Stable chronic right convexity postoperative collection. Electronically Signed: By: Genevie Ann M.D. On: 03/21/2016 13:54    Impression:  82 y.o. male with recurrent multifocal meningiomas with increasing nodularity in the previously treated tumor.  Plan: I discussed the patient's recent MRI findings, and  have reviewed the Dr. Kathyrn Sheriff is aware as well. He is in agreement with steroids that we have implemented, and the patient will continue on 4 mg twice a day. We discussed palliative care and its role, and is interested in meeting with them. He has an appointment with Dr. Alen Blew today at 9 am and will return after his appointment with Dr. Alen Blew to talk to Wadie Lessen to discuss palliative care options at home. Given the thrush noted on exam, I will call in a new prescription for Miracle Mouthwash for his thrush.  The above documentation reflects my direct findings during this shared patient visit. Please see the separate note by Dr. Tammi Klippel on this date for the remainder of the patient's plan of care.    Carola Rhine, PAC    This document serves as  a record of services personally performed by Shona Simpson, PAC and Tyler Pita, MD. It was created on their behalf by Lendon Collar, a trained medical scribe. The creation of this record is based on the scribe's personal observations and the provider's statements to them. This document has been checked and approved by the attending provider.

## 2016-03-28 NOTE — Telephone Encounter (Signed)
-----   Message from Wyatt Portela, MD sent at 03/28/2016 12:04 PM EDT ----- Please let him know he needs IV iron again. His iron level still low.  POF sent to schedule him soon.

## 2016-03-28 NOTE — Progress Notes (Signed)
Patient and wife return today to review recent MRI. Weight and vitals stable. Denies pain. Reports several recent falls. Patient attributes falls to "legs giving out without warning." Wife notes an overall improvement in her husband since he began taking decadron 4 mg bid since 03/23/16. Thin white coating noted base of tongue concerning for the start of thrush. Denies headaches, dizziness, nausea or vomiting. Denies diplopia. Reports chronic ringing in his left ear continues. Wife requested palliative care referral. Nurse will place that referral today.  BP 131/62 mmHg  Pulse 81  Temp(Src) 97.8 F (36.6 C) (Oral)  Resp 18  Wt 200 lb (90.719 kg)  SpO2 96% Wt Readings from Last 3 Encounters:  03/28/16 200 lb (90.719 kg)  03/07/16 212 lb (96.163 kg)  01/01/16 200 lb (90.719 kg)

## 2016-03-28 NOTE — Telephone Encounter (Signed)
per pof to sch pt appt-gave pt copy of avs °

## 2016-03-28 NOTE — Telephone Encounter (Signed)
As noted below by Dr. Alen Blew, I informed Mrs. Grills about patient's lab results. Told her someone from scheduling will be calling to arrange for IV iron. She verbalized understanding.

## 2016-03-30 ENCOUNTER — Telehealth: Payer: Self-pay | Admitting: Oncology

## 2016-03-30 NOTE — Telephone Encounter (Signed)
SPOKE WITH RE APPOINTMENT FOR 7/14 AND 7/21. IRON INFUSION ADDED FOR THIS WEEK AND NEXT WEEK PER 2ND 7/10 POF.

## 2016-04-01 ENCOUNTER — Ambulatory Visit (HOSPITAL_BASED_OUTPATIENT_CLINIC_OR_DEPARTMENT_OTHER): Payer: Medicare Other

## 2016-04-01 VITALS — BP 128/69 | HR 68 | Temp 98.1°F | Resp 18

## 2016-04-01 DIAGNOSIS — D509 Iron deficiency anemia, unspecified: Secondary | ICD-10-CM

## 2016-04-01 MED ORDER — SODIUM CHLORIDE 0.9 % IV SOLN
Freq: Once | INTRAVENOUS | Status: AC
  Administered 2016-04-01: 12:00:00 via INTRAVENOUS

## 2016-04-01 MED ORDER — SODIUM CHLORIDE 0.9 % IV SOLN
510.0000 mg | Freq: Once | INTRAVENOUS | Status: AC
  Administered 2016-04-01: 510 mg via INTRAVENOUS
  Filled 2016-04-01: qty 17

## 2016-04-01 NOTE — Patient Instructions (Signed)

## 2016-04-04 ENCOUNTER — Encounter: Payer: Self-pay | Admitting: *Deleted

## 2016-04-04 ENCOUNTER — Encounter (HOSPITAL_COMMUNITY): Payer: Self-pay

## 2016-04-04 ENCOUNTER — Emergency Department (HOSPITAL_COMMUNITY): Payer: Medicare Other | Admitting: Anesthesiology

## 2016-04-04 ENCOUNTER — Encounter (HOSPITAL_COMMUNITY): Admission: EM | Disposition: A | Discharge status: Skilled Nursing Facility | Payer: Self-pay | Source: Home / Self Care

## 2016-04-04 ENCOUNTER — Inpatient Hospital Stay (HOSPITAL_COMMUNITY)
Admission: EM | Admit: 2016-04-04 | Discharge: 2016-04-15 | DRG: 853 | Disposition: A | Discharge status: Skilled Nursing Facility | Payer: Medicare Other | Attending: General Surgery | Admitting: General Surgery

## 2016-04-04 ENCOUNTER — Emergency Department (HOSPITAL_COMMUNITY): Payer: Medicare Other

## 2016-04-04 DIAGNOSIS — Z8546 Personal history of malignant neoplasm of prostate: Secondary | ICD-10-CM

## 2016-04-04 DIAGNOSIS — K219 Gastro-esophageal reflux disease without esophagitis: Secondary | ICD-10-CM | POA: Diagnosis present

## 2016-04-04 DIAGNOSIS — R269 Unspecified abnormalities of gait and mobility: Secondary | ICD-10-CM | POA: Diagnosis present

## 2016-04-04 DIAGNOSIS — I10 Essential (primary) hypertension: Secondary | ICD-10-CM | POA: Diagnosis present

## 2016-04-04 DIAGNOSIS — E785 Hyperlipidemia, unspecified: Secondary | ICD-10-CM | POA: Diagnosis present

## 2016-04-04 DIAGNOSIS — G4733 Obstructive sleep apnea (adult) (pediatric): Secondary | ICD-10-CM | POA: Diagnosis present

## 2016-04-04 DIAGNOSIS — Z923 Personal history of irradiation: Secondary | ICD-10-CM | POA: Diagnosis not present

## 2016-04-04 DIAGNOSIS — G9341 Metabolic encephalopathy: Secondary | ICD-10-CM | POA: Diagnosis not present

## 2016-04-04 DIAGNOSIS — K631 Perforation of intestine (nontraumatic): Secondary | ICD-10-CM | POA: Diagnosis present

## 2016-04-04 DIAGNOSIS — K429 Umbilical hernia without obstruction or gangrene: Secondary | ICD-10-CM | POA: Diagnosis present

## 2016-04-04 DIAGNOSIS — R6521 Severe sepsis with septic shock: Secondary | ICD-10-CM | POA: Diagnosis present

## 2016-04-04 DIAGNOSIS — K449 Diaphragmatic hernia without obstruction or gangrene: Secondary | ICD-10-CM | POA: Diagnosis present

## 2016-04-04 DIAGNOSIS — R198 Other specified symptoms and signs involving the digestive system and abdomen: Secondary | ICD-10-CM

## 2016-04-04 DIAGNOSIS — R410 Disorientation, unspecified: Secondary | ICD-10-CM | POA: Diagnosis not present

## 2016-04-04 DIAGNOSIS — Z86010 Personal history of colonic polyps: Secondary | ICD-10-CM

## 2016-04-04 DIAGNOSIS — Z96651 Presence of right artificial knee joint: Secondary | ICD-10-CM | POA: Diagnosis present

## 2016-04-04 DIAGNOSIS — K255 Chronic or unspecified gastric ulcer with perforation: Secondary | ICD-10-CM | POA: Diagnosis present

## 2016-04-04 DIAGNOSIS — J9811 Atelectasis: Secondary | ICD-10-CM | POA: Diagnosis not present

## 2016-04-04 DIAGNOSIS — Z6835 Body mass index (BMI) 35.0-35.9, adult: Secondary | ICD-10-CM

## 2016-04-04 DIAGNOSIS — A419 Sepsis, unspecified organism: Secondary | ICD-10-CM | POA: Diagnosis present

## 2016-04-04 DIAGNOSIS — F05 Delirium due to known physiological condition: Secondary | ICD-10-CM | POA: Diagnosis not present

## 2016-04-04 DIAGNOSIS — K659 Peritonitis, unspecified: Secondary | ICD-10-CM | POA: Diagnosis present

## 2016-04-04 DIAGNOSIS — J9600 Acute respiratory failure, unspecified whether with hypoxia or hypercapnia: Secondary | ICD-10-CM | POA: Diagnosis not present

## 2016-04-04 DIAGNOSIS — G936 Cerebral edema: Secondary | ICD-10-CM | POA: Diagnosis present

## 2016-04-04 DIAGNOSIS — D62 Acute posthemorrhagic anemia: Secondary | ICD-10-CM | POA: Diagnosis not present

## 2016-04-04 DIAGNOSIS — E876 Hypokalemia: Secondary | ICD-10-CM | POA: Diagnosis present

## 2016-04-04 DIAGNOSIS — K567 Ileus, unspecified: Secondary | ICD-10-CM | POA: Diagnosis not present

## 2016-04-04 DIAGNOSIS — R1033 Periumbilical pain: Secondary | ICD-10-CM | POA: Diagnosis present

## 2016-04-04 DIAGNOSIS — Z01818 Encounter for other preprocedural examination: Secondary | ICD-10-CM

## 2016-04-04 DIAGNOSIS — Z85828 Personal history of other malignant neoplasm of skin: Secondary | ICD-10-CM

## 2016-04-04 DIAGNOSIS — Z7952 Long term (current) use of systemic steroids: Secondary | ICD-10-CM

## 2016-04-04 DIAGNOSIS — I248 Other forms of acute ischemic heart disease: Secondary | ICD-10-CM | POA: Diagnosis not present

## 2016-04-04 DIAGNOSIS — D500 Iron deficiency anemia secondary to blood loss (chronic): Secondary | ICD-10-CM

## 2016-04-04 DIAGNOSIS — R062 Wheezing: Secondary | ICD-10-CM

## 2016-04-04 DIAGNOSIS — N179 Acute kidney failure, unspecified: Secondary | ICD-10-CM | POA: Diagnosis not present

## 2016-04-04 DIAGNOSIS — N400 Enlarged prostate without lower urinary tract symptoms: Secondary | ICD-10-CM | POA: Diagnosis present

## 2016-04-04 DIAGNOSIS — J95821 Acute postprocedural respiratory failure: Secondary | ICD-10-CM | POA: Diagnosis not present

## 2016-04-04 DIAGNOSIS — D320 Benign neoplasm of cerebral meninges: Secondary | ICD-10-CM | POA: Diagnosis present

## 2016-04-04 DIAGNOSIS — E669 Obesity, unspecified: Secondary | ICD-10-CM | POA: Diagnosis present

## 2016-04-04 DIAGNOSIS — D509 Iron deficiency anemia, unspecified: Secondary | ICD-10-CM | POA: Diagnosis present

## 2016-04-04 DIAGNOSIS — D329 Benign neoplasm of meninges, unspecified: Secondary | ICD-10-CM | POA: Diagnosis present

## 2016-04-04 DIAGNOSIS — G2581 Restless legs syndrome: Secondary | ICD-10-CM | POA: Diagnosis present

## 2016-04-04 DIAGNOSIS — R2681 Unsteadiness on feet: Secondary | ICD-10-CM | POA: Diagnosis not present

## 2016-04-04 DIAGNOSIS — Z820 Family history of epilepsy and other diseases of the nervous system: Secondary | ICD-10-CM

## 2016-04-04 DIAGNOSIS — J9601 Acute respiratory failure with hypoxia: Secondary | ICD-10-CM | POA: Diagnosis not present

## 2016-04-04 HISTORY — PX: LAPAROTOMY: SHX154

## 2016-04-04 LAB — COMPREHENSIVE METABOLIC PANEL
ALT: 26 U/L (ref 17–63)
AST: 27 U/L (ref 15–41)
Albumin: 3.8 g/dL (ref 3.5–5.0)
Alkaline Phosphatase: 96 U/L (ref 38–126)
Anion gap: 13 (ref 5–15)
BUN: 28 mg/dL — ABNORMAL HIGH (ref 6–20)
CO2: 21 mmol/L — ABNORMAL LOW (ref 22–32)
Calcium: 9.1 mg/dL (ref 8.9–10.3)
Chloride: 99 mmol/L — ABNORMAL LOW (ref 101–111)
Creatinine, Ser: 1.2 mg/dL (ref 0.61–1.24)
GFR calc Af Amer: >60 mL/min (ref >60)
GFR calc non Af Amer: 54 mL/min — ABNORMAL LOW (ref >60)
Glucose, Bld: 192 mg/dL — ABNORMAL HIGH (ref 65–99)
Potassium: 3.9 mmol/L (ref 3.5–5.1)
Sodium: 133 mmol/L — ABNORMAL LOW (ref 135–145)
Total Bilirubin: 0.6 mg/dL (ref 0.3–1.2)
Total Protein: 6.7 g/dL (ref 6.5–8.1)

## 2016-04-04 LAB — URINALYSIS, ROUTINE W REFLEX MICROSCOPIC
Bilirubin Urine: NEGATIVE
Glucose, UA: NEGATIVE mg/dL
Hgb urine dipstick: NEGATIVE
Ketones, ur: NEGATIVE mg/dL
Leukocytes, UA: NEGATIVE
Nitrite: NEGATIVE
Protein, ur: 30 mg/dL — AB
Specific Gravity, Urine: 1.026 (ref 1.005–1.030)
pH: 5 (ref 5.0–8.0)

## 2016-04-04 LAB — CBC
HCT: 40 % (ref 39.0–52.0)
Hemoglobin: 12.5 g/dL — ABNORMAL LOW (ref 13.0–17.0)
MCH: 27.4 pg (ref 26.0–34.0)
MCHC: 31.3 g/dL (ref 30.0–36.0)
MCV: 87.7 fL (ref 78.0–100.0)
Platelets: 521 10*3/uL — ABNORMAL HIGH (ref 150–400)
RBC: 4.56 MIL/uL (ref 4.22–5.81)
RDW: 18 % — ABNORMAL HIGH (ref 11.5–15.5)
WBC: 20.1 10*3/uL — ABNORMAL HIGH (ref 4.0–10.5)

## 2016-04-04 LAB — URINE MICROSCOPIC-ADD ON: Squamous Epithelial / LPF: NONE SEEN

## 2016-04-04 LAB — LIPASE, BLOOD: Lipase: 19 U/L (ref 11–51)

## 2016-04-04 SURGERY — LAPAROTOMY, EXPLORATORY
Anesthesia: General | Site: Abdomen

## 2016-04-04 MED ORDER — PHENYLEPHRINE HCL 10 MG/ML IJ SOLN
20.0000 mg | INTRAMUSCULAR | Status: DC | PRN
  Administered 2016-04-04: 30 ug/min via INTRAVENOUS

## 2016-04-04 MED ORDER — NOREPINEPHRINE BITARTRATE 1 MG/ML IV SOLN
0.0000 ug/min | INTRAVENOUS | Status: DC
  Administered 2016-04-04: 2 ug/min via INTRAVENOUS
  Administered 2016-04-05: 25 ug/min via INTRAVENOUS
  Administered 2016-04-05: 20 ug/min via INTRAVENOUS
  Filled 2016-04-04 (×3): qty 4

## 2016-04-04 MED ORDER — SUCCINYLCHOLINE CHLORIDE 20 MG/ML IJ SOLN
INTRAMUSCULAR | Status: DC | PRN
  Administered 2016-04-04: 100 mg via INTRAVENOUS

## 2016-04-04 MED ORDER — SODIUM CHLORIDE 0.9 % IV SOLN: Freq: Once | INTRAVENOUS | Status: DC

## 2016-04-04 MED ORDER — FENTANYL CITRATE (PF) 250 MCG/5ML IJ SOLN
INTRAMUSCULAR | Status: AC
  Filled 2016-04-04: qty 5

## 2016-04-04 MED ORDER — HYDROMORPHONE HCL 1 MG/ML IJ SOLN
1.0000 mg | Freq: Once | INTRAMUSCULAR | Status: AC
  Administered 2016-04-04: 1 mg via INTRAVENOUS
  Filled 2016-04-04: qty 1

## 2016-04-04 MED ORDER — PROMETHAZINE HCL 25 MG/ML IJ SOLN
12.5000 mg | Freq: Once | INTRAMUSCULAR | Status: AC
  Administered 2016-04-04: 12.5 mg via INTRAVENOUS
  Filled 2016-04-04: qty 1

## 2016-04-04 MED ORDER — CEFAZOLIN SODIUM-DEXTROSE 2-3 GM-% IV SOLR
INTRAVENOUS | Status: DC | PRN
  Administered 2016-04-04: 2 g via INTRAVENOUS

## 2016-04-04 MED ORDER — EPHEDRINE SULFATE 50 MG/ML IJ SOLN
INTRAMUSCULAR | Status: DC | PRN
  Administered 2016-04-04: 15 mg via INTRAVENOUS

## 2016-04-04 MED ORDER — SODIUM CHLORIDE 0.9 % IV BOLUS (SEPSIS)
1000.0000 mL | Freq: Once | INTRAVENOUS | Status: AC
  Administered 2016-04-04: 1000 mL via INTRAVENOUS

## 2016-04-04 MED ORDER — PROPOFOL 10 MG/ML IV BOLUS
INTRAVENOUS | Status: DC | PRN
  Administered 2016-04-04: 100 mg via INTRAVENOUS

## 2016-04-04 MED ORDER — FENTANYL CITRATE (PF) 100 MCG/2ML IJ SOLN
INTRAMUSCULAR | Status: DC | PRN
  Administered 2016-04-04: 50 ug via INTRAVENOUS
  Administered 2016-04-04 – 2016-04-05 (×2): 100 ug via INTRAVENOUS

## 2016-04-04 MED ORDER — LIDOCAINE HCL (CARDIAC) 20 MG/ML IV SOLN
INTRAVENOUS | Status: AC
  Filled 2016-04-04: qty 5

## 2016-04-04 MED ORDER — ROCURONIUM BROMIDE 100 MG/10ML IV SOLN
INTRAVENOUS | Status: DC | PRN
  Administered 2016-04-04: 50 mg via INTRAVENOUS
  Administered 2016-04-05: 30 mg via INTRAVENOUS
  Administered 2016-04-05: 20 mg via INTRAVENOUS

## 2016-04-04 MED ORDER — LIDOCAINE HCL (CARDIAC) 20 MG/ML IV SOLN
INTRAVENOUS | Status: DC | PRN
Start: 2016-04-04 — End: 2016-04-05
  Administered 2016-04-04: 50 mg via INTRAVENOUS

## 2016-04-04 MED ORDER — PIPERACILLIN-TAZOBACTAM 3.375 G IVPB 30 MIN
3.3750 g | Freq: Once | INTRAVENOUS | Status: AC
  Administered 2016-04-04: 3.375 g via INTRAVENOUS
  Filled 2016-04-04: qty 50

## 2016-04-04 MED ORDER — PROPOFOL 10 MG/ML IV BOLUS
INTRAVENOUS | Status: AC
  Filled 2016-04-04: qty 20

## 2016-04-04 MED ORDER — LACTATED RINGERS IV SOLN
INTRAVENOUS | Status: DC | PRN
  Administered 2016-04-04 – 2016-04-05 (×4): via INTRAVENOUS

## 2016-04-04 MED ORDER — IOPAMIDOL (ISOVUE-300) INJECTION 61%
100.0000 mL | Freq: Once | INTRAVENOUS | Status: AC | PRN
  Administered 2016-04-04: 100 mL via INTRAVENOUS

## 2016-04-04 MED ORDER — CEFAZOLIN SODIUM-DEXTROSE 2-4 GM/100ML-% IV SOLN
INTRAVENOUS | Status: AC
  Filled 2016-04-04: qty 100

## 2016-04-04 MED ORDER — PHENYLEPHRINE HCL 10 MG/ML IJ SOLN
INTRAMUSCULAR | Status: DC | PRN
  Administered 2016-04-04: 200 ug via INTRAVENOUS
  Administered 2016-04-04 (×2): 120 ug via INTRAVENOUS
  Administered 2016-04-05: 80 ug via INTRAVENOUS
  Administered 2016-04-05: 120 ug via INTRAVENOUS
  Administered 2016-04-05 (×2): 80 ug via INTRAVENOUS

## 2016-04-04 SURGICAL SUPPLY — 47 items
APPLICATOR COTTON TIP 6IN STRL (MISCELLANEOUS) ×3 IMPLANT
BAG URINE DRAINAGE (UROLOGICAL SUPPLIES) ×2 IMPLANT
BLADE EXTENDED COATED 6.5IN (ELECTRODE) ×2 IMPLANT
BLADE HEX COATED 2.75 (ELECTRODE) ×3 IMPLANT
BNDG GAUZE ELAST 4 BULKY (GAUZE/BANDAGES/DRESSINGS) ×2 IMPLANT
CATH DRAINAGE MALECOT 26FR (CATHETERS) IMPLANT
CATH MALECOT (CATHETERS) ×3
COVER MAYO STAND STRL (DRAPES) ×2 IMPLANT
COVER SURGICAL LIGHT HANDLE (MISCELLANEOUS) ×3 IMPLANT
DRAIN CHANNEL RND F F (WOUND CARE) ×2 IMPLANT
DRAPE LAPAROSCOPIC ABDOMINAL (DRAPES) ×3 IMPLANT
DRAPE WARM FLUID 44X44 (DRAPE) ×2 IMPLANT
ELECT REM PT RETURN 9FT ADLT (ELECTROSURGICAL) ×3
ELECTRODE REM PT RTRN 9FT ADLT (ELECTROSURGICAL) ×1 IMPLANT
EVACUATOR SILICONE 100CC (DRAIN) ×2 IMPLANT
GAUZE SPONGE 4X4 12PLY STRL (GAUZE/BANDAGES/DRESSINGS) ×3 IMPLANT
GLOVE BIO SURGEON STRL SZ7.5 (GLOVE) ×6 IMPLANT
GLOVE BIOGEL PI IND STRL 7.0 (GLOVE) ×1 IMPLANT
GLOVE BIOGEL PI INDICATOR 7.0 (GLOVE) ×2
GOWN STRL REUS W/ TWL XL LVL3 (GOWN DISPOSABLE) ×1 IMPLANT
GOWN STRL REUS W/TWL LRG LVL3 (GOWN DISPOSABLE) ×3 IMPLANT
GOWN STRL REUS W/TWL XL LVL3 (GOWN DISPOSABLE) ×6 IMPLANT
HANDLE SUCTION POOLE (INSTRUMENTS) IMPLANT
KIT BASIN OR (CUSTOM PROCEDURE TRAY) ×3 IMPLANT
LIGASURE IMPACT 36 18CM CVD LR (INSTRUMENTS) ×2 IMPLANT
NS IRRIG 1000ML POUR BTL (IV SOLUTION) ×3 IMPLANT
PACK GENERAL/GYN (CUSTOM PROCEDURE TRAY) ×3 IMPLANT
PAD ABD 8X10 STRL (GAUZE/BANDAGES/DRESSINGS) ×2 IMPLANT
SPONGE DRAIN TRACH 4X4 STRL 2S (GAUZE/BANDAGES/DRESSINGS) ×4 IMPLANT
SPONGE LAP 18X18 X RAY DECT (DISPOSABLE) ×4 IMPLANT
STAPLER VISISTAT 35W (STAPLE) ×1 IMPLANT
SUCTION POOLE HANDLE (INSTRUMENTS)
SUT ETHILON 3 0 PS 1 (SUTURE) ×2 IMPLANT
SUT PDS AB 1 CTX 36 (SUTURE) IMPLANT
SUT PDS AB 1 TP1 96 (SUTURE) ×4 IMPLANT
SUT SILK 2 0 (SUTURE) ×3
SUT SILK 2 0 SH CR/8 (SUTURE) ×4 IMPLANT
SUT SILK 2-0 18XBRD TIE 12 (SUTURE) IMPLANT
SUT SILK 3 0 (SUTURE)
SUT SILK 3 0 SH CR/8 (SUTURE) IMPLANT
SUT SILK 3-0 18XBRD TIE 12 (SUTURE) IMPLANT
TAPE CLOTH SURG 6X10 WHT LF (GAUZE/BANDAGES/DRESSINGS) ×2 IMPLANT
TOWEL OR 17X26 10 PK STRL BLUE (TOWEL DISPOSABLE) ×6 IMPLANT
TRAY FOLEY W/METER SILVER 14FR (SET/KITS/TRAYS/PACK) IMPLANT
TRAY FOLEY W/METER SILVER 16FR (SET/KITS/TRAYS/PACK) ×2 IMPLANT
WATER STERILE IRR 1500ML POUR (IV SOLUTION) ×3 IMPLANT
YANKAUER SUCT BULB TIP NO VENT (SUCTIONS) ×2 IMPLANT

## 2016-04-04 NOTE — ED Notes (Signed)
Per EMS, pt from home.  Pt has hx of umbilical hernia. Sitting on chair today and started having mid abdominal pain.  Hernia is bulging and pt is guarding.  Dry heave with nausea.  IV 20 g LAC, 4mg  zofran given.  200mg  fentanyl.  Vitals: 178/100, hr 102, resp 24,

## 2016-04-04 NOTE — Anesthesia Procedure Notes (Addendum)
Procedure Name: Intubation Performed by: Gean Maidens Pre-anesthesia Checklist: Patient identified, Emergency Drugs available, Suction available, Patient being monitored and Timeout performed Patient Re-evaluated:Patient Re-evaluated prior to inductionOxygen Delivery Method: Circle system utilized Preoxygenation: Pre-oxygenation with 100% oxygen Intubation Type: IV induction Ventilation: Mask ventilation without difficulty Laryngoscope Size: Mac and 4 Grade View: Grade I Tube type: Oral Tube size: 7.5 mm Number of attempts: 1 Airway Equipment and Method: Stylet Placement Confirmation: ETT inserted through vocal cords under direct vision,  positive ETCO2,  CO2 detector and breath sounds checked- equal and bilateral Secured at: 23 cm Tube secured with: Tape Dental Injury: Teeth and Oropharynx as per pre-operative assessment    Central Venous Catheter Insertion Performed by: anesthesiologist Patient location: Pre-op. Preanesthetic checklist: patient identified, IV checked, site marked, risks and benefits discussed, surgical consent, monitors and equipment checked, pre-op evaluation, timeout performed and anesthesia consent Position: reverse Trendelenburg Landmarks identified and Seldinger technique used Catheter size: 8 Fr Central line was placed.Double lumen Procedure performed using ultrasound guided technique. Attempts: 1 Following insertion, dressing applied, line sutured and Biopatch. Post procedure assessment: blood return through all ports, free fluid flow and no air. Patient tolerated the procedure well with no immediate complications.

## 2016-04-04 NOTE — ED Notes (Signed)
Pt is aware a urine sample is needed. 

## 2016-04-04 NOTE — ED Notes (Signed)
Condom catheter applied to pt due to inability to use urinal and risk for falls. Pt family at bedside and instructed not to let pt get up.

## 2016-04-04 NOTE — ED Notes (Signed)
MD notified pt pain is not improving after medications. Currently awaiting CT scan results.

## 2016-04-04 NOTE — H&P (Signed)
[name] is an 82 y.o. male.   Chief Complaint: abdominal pain HPI: The patient is an 82yo wm who presents with severe abdominal pain that started suddenly about 5pm today. It is associated with nausea and vomiting. No fever. He came to ER and CT shows large hiatal hernia and free air thought to possibly be coming from upper GI tract.  Past Medical History  Diagnosis Date  . Anemia   . Prostate cancer ([place])   . Basal cell cancer   . Hyperlipidemia   . Restless leg   . GERD (gastroesophageal reflux disease)   . Brain tumor ([place])   . History of radiation therapy     back approximately 2010 prostate radiation under care of Dr. Valere Dross   . Hypertension   . Herniated disc     lumbar spine  . OSA (obstructive sleep apnea) 10/28/2014    Moderate with AHI 21/hr  . Meningioma ([place])     brain  . Hiatal hernia   . Occult blood in stools   . Nocturia   . Aortic insufficiency 08/11/2015  . Seizures [facility])     Past Surgical History  Procedure Laterality Date  . Joint replacement      right knee replacement  . Bilateral cateract surgery    . Vasectomy    . Colonoscopy      colon polyp removed  . Craniotomy Right 09/27/2013    Procedure: RIGHT FRONTAL CRANIOTOMY FOR TUMOR RESECTION ;  Surgeon: Consuella Lose, MD;  Location: [facility];  Service: Neurosurgery;  Laterality: Right;  . Prostate biopsy    . Tonsillectomy      Agae 6-7  . Craniotomy Right 02/17/2015    Procedure: Right frontal parietal craniotomy for resection of meningioma with brainlab;  Surgeon: Consuella Lose, MD;  Location: [place] [facility];  Service: Neurosurgery;  Laterality: Right;  Right frontal parietal craniotomy for resection of meningioma with brainlab  . Application of cranial navigation N/A 02/17/2015    Procedure: APPLICATION OF CRANIAL NAVIGATION;  Surgeon: Consuella Lose, MD;  Location: [place] [facility];  Service: Neurosurgery;  Laterality: N/A;  APPLICATION OF CRANIAL NAVIGATION    Family History  Problem  Relation Age of Onset  . Parkinsonism Mother   . Dementia Father    Social History:  reports that he has never smoked. He quit smokeless tobacco use about 42 years ago. His smokeless tobacco use included Chew. He reports that he does not drink alcohol or use illicit drugs.  Allergies:  Allergies  Allergen Reactions  . Asa [Aspirin] Nausea Only  . Other Itching and Rash    Nuts  . Vicodin [Hydrocodone-Acetaminophen] Nausea Only     (Not in a hospital admission)  Results for orders placed or performed during the hospital encounter of 04/04/16 (from the past 48 hour(s))  Lipase, blood     Status: None   Collection Time: 04/04/16  7:56 PM  Result Value Ref Range   Lipase 19 11 - 51 U/L  Comprehensive metabolic panel     Status: Abnormal   Collection Time: 04/04/16  7:56 PM  Result Value Ref Range   Sodium 133 (L) 135 - 145 mmol/L   Potassium 3.9 3.5 - 5.1 mmol/L   Chloride 99 (L) 101 - 111 mmol/L   CO2 21 (L) 22 - 32 mmol/L   Glucose, Bld 192 (H) 65 - 99 mg/dL   BUN 28 (H) 6 - 20 mg/dL   Creatinine, Ser 1.20 0.61 - 1.24  mg/dL   Calcium 9.1 8.9 - 10.3 mg/dL   Total Protein 6.7 6.5 - 8.1 g/dL   Albumin 3.8 3.5 - 5.0 g/dL   AST 27 15 - 41 U/L   ALT 26 17 - 63 U/L   Alkaline Phosphatase 96 38 - 126 U/L   Total Bilirubin 0.6 0.3 - 1.2 mg/dL   GFR calc non Af Amer 54 (L) >60 mL/min   GFR calc Af Amer >60 >60 mL/min    Comment: (NOTE) The eGFR has been calculated using the CKD EPI equation. This calculation has not been validated in all clinical situations. eGFR's persistently <60 mL/min signify possible Chronic Kidney Disease.    Anion gap 13 5 - 15  CBC     Status: Abnormal   Collection Time: 04/04/16  7:56 PM  Result Value Ref Range   WBC 20.1 (H) 4.0 - 10.5 K/uL   RBC 4.56 4.22 - 5.81 MIL/uL   Hemoglobin 12.5 (L) 13.0 - 17.0 g/dL   HCT 40.0 39.0 - 52.0 %   MCV 87.7 78.0 - 100.0 fL   MCH 27.4 26.0 - 34.0 pg   MCHC 31.3 30.0 - 36.0 g/dL   RDW 18.0 (H) 11.5 - 15.5 %    Platelets 521 (H) 150 - 400 K/uL  Urinalysis, Routine w reflex microscopic     Status: Abnormal   Collection Time: 04/04/16  8:52 PM  Result Value Ref Range   Color, Urine YELLOW YELLOW   APPearance CLEAR CLEAR   Specific Gravity, Urine 1.026 1.005 - 1.030   pH 5.0 5.0 - 8.0   Glucose, UA NEGATIVE NEGATIVE mg/dL   Hgb urine dipstick NEGATIVE NEGATIVE   Bilirubin Urine NEGATIVE NEGATIVE   Ketones, ur NEGATIVE NEGATIVE mg/dL   Protein, ur 30 (A) NEGATIVE mg/dL   Nitrite NEGATIVE NEGATIVE   Leukocytes, UA NEGATIVE NEGATIVE  Urine microscopic-add on     Status: Abnormal   Collection Time: 04/04/16  8:52 PM  Result Value Ref Range   Squamous Epithelial / LPF NONE SEEN NONE SEEN   WBC, UA 0-5 0 - 5 WBC/hpf   RBC / HPF 0-5 0 - 5 RBC/hpf   Bacteria, UA RARE (A) NONE SEEN   Casts HYALINE CASTS (A) NEGATIVE   Urine-Other MUCOUS PRESENT    Ct Abdomen Pelvis W Contrast  04/04/2016  CLINICAL DATA:  Abdominal and back pain EXAM: CT ABDOMEN AND PELVIS WITH CONTRAST TECHNIQUE: Multidetector CT imaging of the abdomen and pelvis was performed using the standard protocol following bolus administration of intravenous contrast. CONTRAST:  123m ISOVUE-300 IOPAMIDOL (ISOVUE-300) INJECTION 61% COMPARISON:  09/11/2015 FINDINGS: Lower chest and abdominal wall: There is a chronic large hiatal hernia with intrathoracic stomach. The GE junction is above the hiatus with chronic organoaxial volvulus. There is extraluminal gas, fat inflammation, and fluid around the stomach (which is nearly entirely visualized on the delayed phase). The stomach is not overdistended to suggest obstruction. No necrotic segment of bowel or pneumatosis is seen. Gastric vessels normally opacify within the hernia sac. Umbilical hernia containing nonobstructed bowel. Hepatobiliary: No focal liver abnormality.Cholelithiasis. No evidence of biliary obstruction or inflammation. Pancreas: Unremarkable. Spleen: Unremarkable. Adrenals/Urinary  Tract: Negative adrenals. No hydronephrosis or stone. Unremarkable bladder. Stomach/Bowel: Bowel findings described above. Colonic diverticulosis. Reproductive:Fiducial markers in the central right aspect of the prostate gland which has a stable CT appearance. No pelvic adenopathy. Vascular/Lymphatic: Extensive atherosclerotic calcification. No acute vascular abnormality. No mass or adenopathy. Musculoskeletal: Advanced lumbar facet arthropathy with L4-5 and L5-S1  anterolisthesis. Multilevel disc degeneration and spondylosis. Critical Value/emergent results were called by telephone at the time of interpretation on 04/04/2016 at 10:03 pm to Dr. Virgel Manifold , who verbally acknowledged these results. IMPRESSION: 1. Bowel perforation, presumably gastric, with extraluminal gas, fluid, and inflammation in a hiatal hernia sac. There is chronic intrathoracic stomach herniation with organoaxial volvulus. Despite the volvulus, the stomach is not obstructed or distended. 2. Bowel containing umbilical hernia without related obstruction. 3. Cholelithiasis. Electronically Signed   By: Monte Fantasia M.D.   On: 04/04/2016 22:06    Review of Systems  Constitutional: Negative.   HENT: Negative.   Eyes: Negative.   Respiratory: Negative.   Cardiovascular: Negative.   Gastrointestinal: Positive for nausea, vomiting and abdominal pain.  Genitourinary: Negative.   Musculoskeletal: Positive for back pain.  Skin: Negative.   Neurological: Negative.   Endo/Heme/Allergies: Negative.   Psychiatric/Behavioral: Negative.     Blood pressure 151/73, pulse 115, temperature 97.5 F (36.4 C), temperature source Oral, resp. rate 31, SpO2 95 %. Physical Exam  Constitutional: He is oriented to person, place, and time. He appears well-developed and well-nourished.  HENT:  Head: Normocephalic and atraumatic.  Eyes: Conjunctivae and EOM are normal. Pupils are equal, round, and reactive to light.  Neck: Normal range of motion.  Neck supple.  Cardiovascular: Normal rate, regular rhythm and normal heart sounds.   Respiratory: Effort normal and breath sounds normal.  GI:  There is moderate diffuse tenderness with guarding. Reducible umbilical hernia  Musculoskeletal: Normal range of motion.  Neurological: He is alert and oriented to person, place, and time.  Skin: Skin is warm and dry.  Psychiatric: He has a normal mood and affect. His behavior is normal.     Assessment/Plan The patient appears to have a perforation of his intestines. Because of the risk of sepsis I would recommend exploration tonight. Risks and benefits of the surgery as well as some of the technical aspects were discussed with the patient including the possible need for bowel resection, ostomy, and/or gastrostomy and he understands and wishes to proceed  Merrie Roof, MD 04/04/2016, 10:44 PM

## 2016-04-04 NOTE — ED Provider Notes (Signed)
CSN: [id number]     Arrival date & time 04/04/16  1842 History  By signing my name below, I, Evelene Croon, attest that this documentation has been prepared under the direction and in the presence of Virgel Manifold, MD . Electronically Signed: Evelene Croon, Scribe. 04/04/2016. 7:31 PM.    Chief Complaint  Patient presents with  . Abdominal Pain   The history is provided by the patient. No language interpreter was used.     HPI Comments:  [name] is a 82 y.o. male who presents to the Emergency Department complaining of sudden onset, periumbilical abdominal pain which began this afternoon. His pain radiates to his back. He reports associated nausea.He denies vomiting, fever, and difficulty urinating. Pt reports h/o umbilical hernia; states he's never had issue with it. He also notes a second abdominal hernia but is not sure where it is located. He denies h/o abdominal surgery. No alleviating factors noted. Pt states he was fine prior to symptom onset.    Past Medical History  Diagnosis Date  . Anemia   . Prostate cancer ([place])   . Basal cell cancer   . Hyperlipidemia   . Restless leg   . GERD (gastroesophageal reflux disease)   . Brain tumor ([place])   . History of radiation therapy     back approximately 2010 prostate radiation under care of Dr. Valere Dross   . Hypertension   . Herniated disc     lumbar spine  . OSA (obstructive sleep apnea) 10/28/2014    Moderate with AHI 21/hr  . Meningioma ([place])     brain  . Hiatal hernia   . Occult blood in stools   . Nocturia   . Aortic insufficiency 08/11/2015  . Seizures [facility])    Past Surgical History  Procedure Laterality Date  . Joint replacement      right knee replacement  . Bilateral cateract surgery    . Vasectomy    . Colonoscopy      colon polyp removed  . Craniotomy Right 09/27/2013    Procedure: RIGHT FRONTAL CRANIOTOMY FOR TUMOR RESECTION ;  Surgeon: Consuella Lose, MD;  Location: [place] NEURO ORS;  Service: Neurosurgery;   Laterality: Right;  . Prostate biopsy    . Tonsillectomy      Agae 6-7  . Craniotomy Right 02/17/2015    Procedure: Right frontal parietal craniotomy for resection of meningioma with brainlab;  Surgeon: Consuella Lose, MD;  Location: [place] NEURO ORS;  Service: Neurosurgery;  Laterality: Right;  Right frontal parietal craniotomy for resection of meningioma with brainlab  . Application of cranial navigation N/A 02/17/2015    Procedure: APPLICATION OF CRANIAL NAVIGATION;  Surgeon: Consuella Lose, MD;  Location: [place] NEURO ORS;  Service: Neurosurgery;  Laterality: N/A;  APPLICATION OF CRANIAL NAVIGATION   Family History  Problem Relation Age of Onset  . Parkinsonism Mother   . Dementia Father    Social History  Substance Use Topics  . Smoking status: Never Smoker   . Smokeless tobacco: Former Systems developer    Types: [place] date: 10/08/1973  . Alcohol Use: No    Review of Systems  Constitutional: Negative for fever.  Gastrointestinal: Positive for nausea and abdominal pain. Negative for vomiting.  Genitourinary: Negative for dysuria and difficulty urinating.  Musculoskeletal: Positive for back pain.  All other systems reviewed and are negative.  Allergies  Asa; Other; and Vicodin  Home Medications   Prior to Admission medications   Medication Sig Start Date  End Date Taking? Authorizing Provider  acetaminophen (TYLENOL) 500 MG tablet Take 1,000 mg by mouth every 8 (eight) hours as needed (pain).    Historical Provider, MD  b complex vitamins tablet Take 1 tablet by mouth daily.    Historical Provider, MD  dexamethasone (DECADRON) 4 MG tablet Take 1 tablet (4 mg total) by mouth 2 (two) times daily. 03/23/16   Hayden Pedro, PA-C  famotidine (PEPCID) 20 MG tablet Take 20 mg by mouth at bedtime.    Historical Provider, MD  ferrous sulfate 325 (65 FE) MG tablet Take 1,300 mg by mouth every morning.     Historical Provider, MD  furosemide (LASIX) 20 MG tablet Take 20 mg by mouth  daily. 02/22/16   Historical Provider, MD  lamoTRIgine (LAMICTAL) 25 MG tablet Take 4 tablets by mouth 2 (two) times daily. 03/23/16   Historical Provider, MD  levETIRAcetam (KEPPRA) 500 MG tablet Take 1 & 1/2 tablets twice a day 12/01/15   Cameron Sprang, MD  magic mouthwash SOLN Take 15 mLs by mouth 3 (three) times daily. 03/28/16 04/04/16  Hayden Pedro, PA-C  magnesium gluconate (MAGONATE) 500 MG tablet Take 500 mg by mouth daily.    Historical Provider, MD  multivitamin-lutein (OCUVITE-LUTEIN) CAPS capsule Take 1 capsule by mouth daily.    Historical Provider, MD  oxyCODONE (OXY IR/ROXICODONE) 5 MG immediate release tablet Take 1 tablet (5 mg total) by mouth every 4 (four) hours as needed for moderate pain. 01/02/16   Belkys A Regalado, MD  Probiotic Product (PROBIOTIC DAILY PO) Take 1 capsule by mouth daily.     Historical Provider, MD  rOPINIRole (REQUIP) 3 MG tablet Take 1.5-3 mg by mouth daily. Take 3mg s at bedtime.  May take 1.5mg s during the day for restless legs then take the other 1.5mg s at bedtime (no more than 3 mgs daily) 11/18/14   Historical Provider, MD  solifenacin (VESICARE) 5 MG tablet Take 1 tablet (5 mg total) by mouth daily. 10/18/13   Lavon Paganini Angiulli, PA-C  tamsulosin (FLOMAX) 0.4 MG CAPS capsule Take 1 capsule (0.4 mg total) by mouth daily after supper. 10/18/13   Lavon Paganini Angiulli, PA-C  valsartan (DIOVAN) 160 MG tablet Take 160 mg by mouth daily.    Historical Provider, MD  vitamin B-12 (CYANOCOBALAMIN) 100 MCG tablet Take 100 mcg by mouth daily.     Historical Provider, MD   BP 151/73 mmHg  Pulse 115  Temp(Src) 97.5 F (36.4 C) (Oral)  Resp 31  SpO2 95% Physical Exam  Constitutional: He is oriented to person, place, and time. He appears well-developed and well-nourished.  Uncomfortable; dry heaving   HENT:  Head: Normocephalic and atraumatic.  Eyes: EOM are normal.  Neck: Normal range of motion.  Cardiovascular: Normal rate, regular rhythm, normal heart sounds  and intact distal pulses.   Pulmonary/Chest: Effort normal and breath sounds normal. No respiratory distress.  Abdominal: Soft.  Diffusely tender. Guarding. No rebound. Reducible umbilical  hernia  Musculoskeletal: Normal range of motion.  Neurological: He is alert and oriented to person, place, and time.  Skin: Skin is warm and dry.  Psychiatric: He has a normal mood and affect. Judgment normal.  Nursing note and vitals reviewed.   ED Course  Procedures   CRITICAL CARE Performed by: Virgel Manifold Total critical care time: 35 minutes Critical care time was exclusive of separately billable procedures and treating other patients. Critical care was necessary to treat or prevent imminent or life-threatening deterioration. Critical care  was time spent personally by me on the following activities: development of treatment plan with patient and/or surrogate as well as nursing, discussions with consultants, evaluation of patient's response to treatment, examination of patient, obtaining history from patient or surrogate, ordering and performing treatments and interventions, ordering and review of laboratory studies, ordering and review of radiographic studies, pulse oximetry and re-evaluation of patient's condition.    DIAGNOSTIC STUDIES:  Oxygen Saturation is 99% on RA, normal by my interpretation.    COORDINATION OF CARE:  7:12 PM Discussed treatment plan with pt at bedside and pt agreed to plan.  Labs Review Labs Reviewed  COMPREHENSIVE METABOLIC PANEL - Abnormal; Notable for the following:    Sodium 133 (*)    Chloride 99 (*)    CO2 21 (*)    Glucose, Bld 192 (*)    BUN 28 (*)    GFR calc non Af Amer 54 (*)    All other components within normal limits  CBC - Abnormal; Notable for the following:    WBC 20.1 (*)    Hemoglobin 12.5 (*)    RDW 18.0 (*)    Platelets 521 (*)    All other components within normal limits  URINALYSIS, ROUTINE W REFLEX MICROSCOPIC (NOT AT [facility]) -  Abnormal; Notable for the following:    Protein, ur 30 (*)    All other components within normal limits  URINE MICROSCOPIC-ADD ON - Abnormal; Notable for the following:    Bacteria, UA RARE (*)    Casts HYALINE CASTS (*)    All other components within normal limits  LIPASE, BLOOD    Imaging Review Ct Abdomen Pelvis W Contrast  04/04/2016  CLINICAL DATA:  Abdominal and back pain EXAM: CT ABDOMEN AND PELVIS WITH CONTRAST TECHNIQUE: Multidetector CT imaging of the abdomen and pelvis was performed using the standard protocol following bolus administration of intravenous contrast. CONTRAST:  16mL ISOVUE-300 IOPAMIDOL (ISOVUE-300) INJECTION 61% COMPARISON:  09/11/2015 FINDINGS: Lower chest and abdominal wall: There is a chronic large hiatal hernia with intrathoracic stomach. The GE junction is above the hiatus with chronic organoaxial volvulus. There is extraluminal gas, fat inflammation, and fluid around the stomach (which is nearly entirely visualized on the delayed phase). The stomach is not overdistended to suggest obstruction. No necrotic segment of bowel or pneumatosis is seen. Gastric vessels normally opacify within the hernia sac. Umbilical hernia containing nonobstructed bowel. Hepatobiliary: No focal liver abnormality.Cholelithiasis. No evidence of biliary obstruction or inflammation. Pancreas: Unremarkable. Spleen: Unremarkable. Adrenals/Urinary Tract: Negative adrenals. No hydronephrosis or stone. Unremarkable bladder. Stomach/Bowel: Bowel findings described above. Colonic diverticulosis. Reproductive:Fiducial markers in the central right aspect of the prostate gland which has a stable CT appearance. No pelvic adenopathy. Vascular/Lymphatic: Extensive atherosclerotic calcification. No acute vascular abnormality. No mass or adenopathy. Musculoskeletal: Advanced lumbar facet arthropathy with L4-5 and L5-S1 anterolisthesis. Multilevel disc degeneration and spondylosis. Critical Value/emergent results  were called by telephone at the time of interpretation on 04/04/2016 at 10:03 pm to Dr. Virgel Manifold , who verbally acknowledged these results. IMPRESSION: 1. Bowel perforation, presumably gastric, with extraluminal gas, fluid, and inflammation in a hiatal hernia sac. There is chronic intrathoracic stomach herniation with organoaxial volvulus. Despite the volvulus, the stomach is not obstructed or distended. 2. Bowel containing umbilical hernia without related obstruction. 3. Cholelithiasis. Electronically Signed   By: Monte Fantasia M.D.   On: 04/04/2016 22:06   I have personally reviewed and evaluated these images and lab results as part of my medical decision-making.  EKG Interpretation None       10:08 PM Discussed case with Dr. Marlou Starks (Surgery)  MDM   Final diagnoses:  Perforated viscus    85yM with acute onset of severe abdominal pain radiating to his back. CT significant for likely gastric perforation. Abx. Surgery. Pt updated.   I personally preformed the services scribed in my presence. The recorded information has been reviewed is accurate. Virgel Manifold, MD.     Virgel Manifold, MD 04/04/16 2222

## 2016-04-05 ENCOUNTER — Inpatient Hospital Stay (HOSPITAL_COMMUNITY): Payer: Medicare Other

## 2016-04-05 ENCOUNTER — Encounter (HOSPITAL_COMMUNITY): Payer: Self-pay | Admitting: General Surgery

## 2016-04-05 DIAGNOSIS — R6521 Severe sepsis with septic shock: Secondary | ICD-10-CM

## 2016-04-05 DIAGNOSIS — J9600 Acute respiratory failure, unspecified whether with hypoxia or hypercapnia: Secondary | ICD-10-CM

## 2016-04-05 DIAGNOSIS — K631 Perforation of intestine (nontraumatic): Secondary | ICD-10-CM

## 2016-04-05 DIAGNOSIS — A419 Sepsis, unspecified organism: Principal | ICD-10-CM

## 2016-04-05 DIAGNOSIS — J9601 Acute respiratory failure with hypoxia: Secondary | ICD-10-CM

## 2016-04-05 LAB — BLOOD GAS, ARTERIAL
ACID-BASE DEFICIT: 7.3 mmol/L — AB (ref 0.0–2.0)
Acid-base deficit: 7.9 mmol/L — ABNORMAL HIGH (ref 0.0–2.0)
BICARBONATE: 17.4 meq/L — AB (ref 20.0–24.0)
Bicarbonate: 17.5 mEq/L — ABNORMAL LOW (ref 20.0–24.0)
DRAWN BY: 235321
DRAWN BY: 235321
FIO2: 0.5
FIO2: 0.3
LHR: 12 {breaths}/min
O2 SAT: 99 %
O2 Saturation: 94.2 %
PEEP/CPAP: 5 cmH2O
PEEP: 5 cmH2O
PH ART: 7.35 (ref 7.350–7.450)
Patient temperature: 98.6
Patient temperature: 95
RATE: 16 resp/min
TCO2: 15.7 mmol/L (ref 0–100)
TCO2: 15.6 mmol/L (ref 0–100)
VT: 550 mL
VT: 550 mL
pCO2 arterial: 36.6 mmHg (ref 35.0–45.0)
pCO2 arterial: 31.7 mmHg — ABNORMAL LOW (ref 35.0–45.0)
pH, Arterial: 7.299 — ABNORMAL LOW (ref 7.350–7.450)
pO2, Arterial: 145 mmHg — ABNORMAL HIGH (ref 80.0–100.0)
pO2, Arterial: 67.5 mmHg — ABNORMAL LOW (ref 80.0–100.0)

## 2016-04-05 LAB — BASIC METABOLIC PANEL
Anion gap: 9 (ref 5–15)
BUN: 24 mg/dL — AB (ref 6–20)
CHLORIDE: 101 mmol/L (ref 101–111)
CO2: 20 mmol/L — ABNORMAL LOW (ref 22–32)
Calcium: 7.6 mg/dL — ABNORMAL LOW (ref 8.9–10.3)
Creatinine, Ser: 1.42 mg/dL — ABNORMAL HIGH (ref 0.61–1.24)
GFR calc Af Amer: 51 mL/min — ABNORMAL LOW (ref >60)
GFR calc non Af Amer: 44 mL/min — ABNORMAL LOW (ref >60)
GLUCOSE: 174 mg/dL — AB (ref 65–99)
POTASSIUM: 4.5 mmol/L (ref 3.5–5.1)
Sodium: 130 mmol/L — ABNORMAL LOW (ref 135–145)

## 2016-04-05 LAB — POCT I-STAT 7, (LYTES, BLD GAS, ICA,H+H)
Acid-base deficit: 6 mmol/L — ABNORMAL HIGH (ref 0.0–2.0)
Acid-base deficit: 6 mmol/L — ABNORMAL HIGH (ref 0.0–2.0)
BICARBONATE: 19.8 meq/L — AB (ref 20.0–24.0)
Bicarbonate: 20.5 mEq/L (ref 20.0–24.0)
Calcium, Ion: 1.16 mmol/L (ref 1.12–1.23)
Calcium, Ion: 1.19 mmol/L (ref 1.12–1.23)
HCT: 32 % — ABNORMAL LOW (ref 39.0–52.0)
HEMATOCRIT: 31 % — AB (ref 39.0–52.0)
HEMOGLOBIN: 10.9 g/dL — AB (ref 13.0–17.0)
Hemoglobin: 10.5 g/dL — ABNORMAL LOW (ref 13.0–17.0)
O2 SAT: 100 %
O2 Saturation: 100 %
PCO2 ART: 37.4 mmHg (ref 35.0–45.0)
PCO2 ART: 41.8 mmHg (ref 35.0–45.0)
PO2 ART: 383 mmHg — AB (ref 80.0–100.0)
POTASSIUM: 3.2 mmol/L — AB (ref 3.5–5.1)
Potassium: 3.4 mmol/L — ABNORMAL LOW (ref 3.5–5.1)
Sodium: 134 mmol/L — ABNORMAL LOW (ref 135–145)
Sodium: 134 mmol/L — ABNORMAL LOW (ref 135–145)
TCO2: 21 mmol/L (ref 0–100)
TCO2: 22 mmol/L (ref 0–100)
pH, Arterial: 7.299 — ABNORMAL LOW (ref 7.350–7.450)
pH, Arterial: 7.331 — ABNORMAL LOW (ref 7.350–7.450)
pO2, Arterial: 328 mmHg — ABNORMAL HIGH (ref 80.0–100.0)

## 2016-04-05 LAB — COMPREHENSIVE METABOLIC PANEL
ALT: 37 U/L (ref 17–63)
AST: 42 U/L — ABNORMAL HIGH (ref 15–41)
Albumin: 2.6 g/dL — ABNORMAL LOW (ref 3.5–5.0)
Alkaline Phosphatase: 64 U/L (ref 38–126)
Anion gap: 10 (ref 5–15)
BUN: 22 mg/dL — ABNORMAL HIGH (ref 6–20)
CHLORIDE: 104 mmol/L (ref 101–111)
CO2: 21 mmol/L — AB (ref 22–32)
Calcium: 7.9 mg/dL — ABNORMAL LOW (ref 8.9–10.3)
Creatinine, Ser: 1.02 mg/dL (ref 0.61–1.24)
Glucose, Bld: 163 mg/dL — ABNORMAL HIGH (ref 65–99)
POTASSIUM: 3.8 mmol/L (ref 3.5–5.1)
SODIUM: 135 mmol/L (ref 135–145)
Total Bilirubin: 0.7 mg/dL (ref 0.3–1.2)
Total Protein: 4.7 g/dL — ABNORMAL LOW (ref 6.5–8.1)

## 2016-04-05 LAB — GLUCOSE, CAPILLARY: Glucose-Capillary: 165 mg/dL — ABNORMAL HIGH (ref 65–99)

## 2016-04-05 LAB — MRSA PCR SCREENING: MRSA by PCR: NEGATIVE

## 2016-04-05 LAB — LACTIC ACID, PLASMA
LACTIC ACID, VENOUS: 5.3 mmol/L — AB (ref 0.5–1.9)
Lactic Acid, Venous: 6 mmol/L (ref 0.5–1.9)

## 2016-04-05 LAB — CBC WITH DIFFERENTIAL/PLATELET
BASOS ABS: 0 10*3/uL (ref 0.0–0.1)
Basophils Relative: 0 %
EOS ABS: 0 10*3/uL (ref 0.0–0.7)
Eosinophils Relative: 0 %
HCT: 45.3 % (ref 39.0–52.0)
HEMOGLOBIN: 14 g/dL (ref 13.0–17.0)
LYMPHS PCT: 2 %
Lymphs Abs: 0.7 10*3/uL (ref 0.7–4.0)
MCH: 28 pg (ref 26.0–34.0)
MCHC: 30.9 g/dL (ref 30.0–36.0)
MCV: 90.6 fL (ref 78.0–100.0)
MONO ABS: 1.1 10*3/uL — AB (ref 0.1–1.0)
Monocytes Relative: 3 %
NEUTROS ABS: 35.2 10*3/uL — AB (ref 1.7–7.7)
Neutrophils Relative %: 95 %
PLATELETS: 279 10*3/uL (ref 150–400)
RBC: 5 MIL/uL (ref 4.22–5.81)
RDW: 17.2 % — AB (ref 11.5–15.5)
WBC: 37 10*3/uL — ABNORMAL HIGH (ref 4.0–10.5)

## 2016-04-05 LAB — TRIGLYCERIDES: Triglycerides: 101 mg/dL (ref <150)

## 2016-04-05 LAB — ECHOCARDIOGRAM COMPLETE: HEIGHTINCHES: 68 in

## 2016-04-05 LAB — CORTISOL: Cortisol, Plasma: 16.1 ug/dL

## 2016-04-05 LAB — CBC
HEMATOCRIT: 45.8 % (ref 39.0–52.0)
HEMOGLOBIN: 14.6 g/dL (ref 13.0–17.0)
MCH: 27.9 pg (ref 26.0–34.0)
MCHC: 31.9 g/dL (ref 30.0–36.0)
MCV: 87.6 fL (ref 78.0–100.0)
Platelets: 397 10*3/uL (ref 150–400)
RBC: 5.23 MIL/uL (ref 4.22–5.81)
RDW: 17.5 % — ABNORMAL HIGH (ref 11.5–15.5)
WBC: 39.9 10*3/uL — ABNORMAL HIGH (ref 4.0–10.5)

## 2016-04-05 LAB — PHOSPHORUS: PHOSPHORUS: 4.6 mg/dL (ref 2.5–4.6)

## 2016-04-05 LAB — PREPARE RBC (CROSSMATCH)

## 2016-04-05 LAB — PROTIME-INR
INR: 1.04 (ref 0.00–1.49)
Prothrombin Time: 13.4 seconds (ref 11.6–15.2)

## 2016-04-05 LAB — TROPONIN I
Troponin I: 0.04 ng/mL (ref <0.03)
Troponin I: 0.05 ng/mL (ref <0.03)

## 2016-04-05 LAB — MAGNESIUM: MAGNESIUM: 1.6 mg/dL — AB (ref 1.7–2.4)

## 2016-04-05 LAB — PREALBUMIN: Prealbumin: 31.9 mg/dL (ref 18–38)

## 2016-04-05 MED ORDER — SODIUM CHLORIDE 0.9 % IV SOLN: Freq: Once | INTRAVENOUS | Status: DC

## 2016-04-05 MED ORDER — PROPOFOL 1000 MG/100ML IV EMUL: 0.0000 ug/kg/min | INTRAVENOUS | Status: DC

## 2016-04-05 MED ORDER — FENTANYL CITRATE (PF) 100 MCG/2ML IJ SOLN: 50.0000 ug | INTRAMUSCULAR | Status: DC | PRN

## 2016-04-05 MED ORDER — LEVETIRACETAM 500 MG/5ML IV SOLN
750.0000 mg | Freq: Two times a day (BID) | INTRAVENOUS | Status: DC
  Administered 2016-04-05 – 2016-04-13 (×17): 750 mg via INTRAVENOUS
  Filled 2016-04-05 (×19): qty 7.5

## 2016-04-05 MED ORDER — PROPOFOL 1000 MG/100ML IV EMUL
5.0000 ug/kg/min | INTRAVENOUS | Status: DC
  Filled 2016-04-05 (×2): qty 100

## 2016-04-05 MED ORDER — MIDAZOLAM HCL 2 MG/2ML IJ SOLN
1.0000 mg | INTRAMUSCULAR | Status: DC | PRN
  Administered 2016-04-06 (×2): 2 mg via INTRAVENOUS
  Filled 2016-04-05 (×2): qty 2

## 2016-04-05 MED ORDER — MIDAZOLAM HCL 2 MG/2ML IJ SOLN
1.0000 mg | INTRAMUSCULAR | Status: DC | PRN
  Administered 2016-04-05 (×3): 2 mg via INTRAVENOUS
  Filled 2016-04-05 (×3): qty 2

## 2016-04-05 MED ORDER — ONDANSETRON HCL 4 MG/2ML IJ SOLN: 4.0000 mg | Freq: Four times a day (QID) | INTRAMUSCULAR | Status: DC | PRN

## 2016-04-05 MED ORDER — HEPARIN SODIUM (PORCINE) 5000 UNIT/ML IJ SOLN: 5000.0000 [IU] | Freq: Three times a day (TID) | INTRAMUSCULAR | Status: DC

## 2016-04-05 MED ORDER — ONDANSETRON 4 MG PO TBDP
4.0000 mg | ORAL_TABLET | Freq: Four times a day (QID) | ORAL | Status: DC | PRN
  Filled 2016-04-05: qty 1

## 2016-04-05 MED ORDER — MORPHINE SULFATE (PF) 2 MG/ML IV SOLN: 1.0000 mg | INTRAVENOUS | Status: DC | PRN

## 2016-04-05 MED ORDER — SODIUM CHLORIDE 0.9 % IV BOLUS (SEPSIS): 750.0000 mL | Freq: Once | INTRAVENOUS | Status: DC

## 2016-04-05 MED ORDER — ONDANSETRON HCL 4 MG/2ML IJ SOLN
INTRAMUSCULAR | Status: DC | PRN
  Administered 2016-04-05: 4 mg via INTRAVENOUS

## 2016-04-05 MED ORDER — VITAMINS A & D EX OINT
TOPICAL_OINTMENT | CUTANEOUS | Status: AC
  Administered 2016-04-05: 1
  Filled 2016-04-05: qty 5

## 2016-04-05 MED ORDER — PIPERACILLIN-TAZOBACTAM 3.375 G IVPB
3.3750 g | Freq: Three times a day (TID) | INTRAVENOUS | Status: DC
  Administered 2016-04-05 – 2016-04-12 (×22): 3.375 g via INTRAVENOUS
  Filled 2016-04-05 (×24): qty 50

## 2016-04-05 MED ORDER — HEPARIN SODIUM (PORCINE) 5000 UNIT/ML IJ SOLN
5000.0000 [IU] | Freq: Three times a day (TID) | INTRAMUSCULAR | Status: DC
  Administered 2016-04-05 – 2016-04-15 (×30): 5000 [IU] via SUBCUTANEOUS
  Filled 2016-04-05 (×30): qty 1

## 2016-04-05 MED ORDER — SODIUM CHLORIDE 0.9 % IR SOLN
Status: DC | PRN
  Administered 2016-04-05: 3000 mL

## 2016-04-05 MED ORDER — SODIUM CHLORIDE 0.9 % IV BOLUS (SEPSIS)
750.0000 mL | INTRAVENOUS | Status: DC | PRN
  Administered 2016-04-05: 750 mL via INTRAVENOUS
  Filled 2016-04-05 (×2): qty 750

## 2016-04-05 MED ORDER — CHLORHEXIDINE GLUCONATE 0.12% ORAL RINSE (MEDLINE KIT)
15.0000 mL | Freq: Two times a day (BID) | OROMUCOSAL | Status: DC
  Administered 2016-04-05 – 2016-04-06 (×4): 15 mL via OROMUCOSAL

## 2016-04-05 MED ORDER — SODIUM CHLORIDE 0.9 % IV SOLN
25.0000 ug/h | INTRAVENOUS | Status: DC
  Administered 2016-04-05: 25 ug/h via INTRAVENOUS
  Administered 2016-04-05: 50 ug/h via INTRAVENOUS
  Filled 2016-04-05 (×3): qty 50

## 2016-04-05 MED ORDER — HYDROCORTISONE NA SUCCINATE PF 100 MG IJ SOLR
50.0000 mg | Freq: Four times a day (QID) | INTRAMUSCULAR | Status: DC
  Administered 2016-04-05 – 2016-04-08 (×13): 50 mg via INTRAVENOUS
  Filled 2016-04-05 (×13): qty 2

## 2016-04-05 MED ORDER — FAMOTIDINE IN NACL 20-0.9 MG/50ML-% IV SOLN
20.0000 mg | Freq: Two times a day (BID) | INTRAVENOUS | Status: DC
  Administered 2016-04-05 – 2016-04-13 (×17): 20 mg via INTRAVENOUS
  Filled 2016-04-05 (×17): qty 50

## 2016-04-05 MED ORDER — SODIUM CHLORIDE 0.9 % IV BOLUS (SEPSIS)
500.0000 mL | Freq: Once | INTRAVENOUS | Status: AC
  Administered 2016-04-05: 500 mL via INTRAVENOUS

## 2016-04-05 MED ORDER — NOREPINEPHRINE BITARTRATE 1 MG/ML IV SOLN
0.0000 ug/min | INTRAVENOUS | Status: DC
  Administered 2016-04-05: 25 ug/min via INTRAVENOUS
  Administered 2016-04-06: 21 ug/min via INTRAVENOUS
  Filled 2016-04-05 (×2): qty 16

## 2016-04-05 MED ORDER — ANTISEPTIC ORAL RINSE SOLUTION (CORINZ)
7.0000 mL | Freq: Four times a day (QID) | OROMUCOSAL | Status: DC
  Administered 2016-04-05 – 2016-04-06 (×7): 7 mL via OROMUCOSAL

## 2016-04-05 MED ORDER — PERFLUTREN LIPID MICROSPHERE
1.0000 mL | INTRAVENOUS | Status: AC | PRN
  Administered 2016-04-05: 2 mL via INTRAVENOUS
  Filled 2016-04-05: qty 10

## 2016-04-05 MED ORDER — KCL IN DEXTROSE-NACL 20-5-0.9 MEQ/L-%-% IV SOLN
INTRAVENOUS | Status: DC
Start: 2016-04-05 — End: 2016-04-08
  Administered 2016-04-05 – 2016-04-07 (×4): via INTRAVENOUS
  Filled 2016-04-05 (×5): qty 1000

## 2016-04-05 MED ORDER — FENTANYL CITRATE (PF) 100 MCG/2ML IJ SOLN
50.0000 ug | INTRAMUSCULAR | Status: DC | PRN
  Administered 2016-04-05: 50 ug via INTRAVENOUS

## 2016-04-05 NOTE — Progress Notes (Signed)
CRITICAL VALUE ALERT  Critical value received:  LA 5.3  Date of notification:  04/05/16  Time of notification:  0635  Critical value read back:Yes.    Nurse who received alert:  Kennis Carina, RN  MD notified (1st page):  [place]  Time of first page:  0650  MD notified (2nd page):  Time of second page:  Responding MD:  [place] Bells  Time MD responded:  [phone]

## 2016-04-05 NOTE — Progress Notes (Signed)
PACU Nsg Note: Bedside hand off report given to rec RN in ICU

## 2016-04-05 NOTE — Progress Notes (Signed)
1 Day Post-Op  Subjective: He is sedated on the vent, but he is alert and knows what's going on. Still on pressors with his blood pressure still down some. The abdominal dressing is intact. It is dry. The JP drain has some bloody fluid in it as does the gastrostomy tube on straight drain to Foley bag. His wife is in the room with him. He has had some ongoing difficulty with falls at home. This is attributed to cerebral edema secondary to his radiation therapy and is on steroids for this at home. He has not had a seizure for several months and was minor, post craniotomy procedure.  Objective: Vital signs in last 24 hours: Temp:  [94.3 F (34.6 C)-98.7 F (37.1 C)] 98.7 F (37.1 C) (07/18 0647) Pulse Rate:  [95-118] 102 (07/18 0718) Resp:  [14-33] 15 (07/18 0718) BP: (140-187)/(55-98) 140/71 mmHg (07/18 0500) SpO2:  [92 %-100 %] 100 % (07/18 0718) Arterial Line BP: (52-133)/(36-64) 101/57 mmHg (07/18 0718) FiO2 (%):  [30 %-50 %] 30 % (07/18 0329)   4019 IV 825 urine Drain 110 Blood: 700 Afebrile, hypothermic earlier, (axillary temps) FiO2 100% on Vent On 25 mcg/kg/min currently No labs since 2 AM; will recheck now CXR 0328 hrs this AM:1. Endotracheal tube and right IJ line in good position. No pneumothorax. 2. Postoperative atelectasis and pulmonary venous congestion.  Intake/Output from previous day: 07/17 0701 - 07/18 0700 In: 4019.1 [I.V.:3384.1; Blood:585; IV Piggyback:50] Out: 1093 [Urine:825; Drains:110; Blood:700] Intake/Output this shift: Total I/O In: 696.7 [I.V.:196.7; IV Piggyback:500] Out: -   General appearance: he is sedated but aware of what is going on. full Ventilator support Resp: clear to auscultation bilaterally and anterior GI: large abdomen, dressing intact, no bowel sounds, drainage from gastrostomy and JP are still bloody. Extremities: no edema   Lab Results:   Recent Labs  04/04/16 1956 04/05/16 0226  WBC 20.1* 37.0*  HGB 12.5* 14.0  HCT 40.0  45.3  PLT 521* 279    BMET  Recent Labs  04/04/16 1956 04/05/16 0226  NA 133* 135  K 3.9 3.8  CL 99* 104  CO2 21* 21*  GLUCOSE 192* 163*  BUN 28* 22*  CREATININE 1.20 1.02  CALCIUM 9.1 7.9*   PT/INR  Recent Labs  04/05/16 0300  LABPROT 13.4  INR 1.04     Recent Labs Lab 04/04/16 1956 04/05/16 0226  AST 27 42*  ALT 26 37  ALKPHOS 96 64  BILITOT 0.6 0.7  PROT 6.7 4.7*  ALBUMIN 3.8 2.6*     Lipase     Component Value Date/Time   LIPASE 19 04/04/2016 1956     Studies/Results: Ct Abdomen Pelvis W Contrast  04/04/2016  CLINICAL DATA:  Abdominal and back pain EXAM: CT ABDOMEN AND PELVIS WITH CONTRAST TECHNIQUE: Multidetector CT imaging of the abdomen and pelvis was performed using the standard protocol following bolus administration of intravenous contrast. CONTRAST:  1104m ISOVUE-300 IOPAMIDOL (ISOVUE-300) INJECTION 61% COMPARISON:  09/11/2015 FINDINGS: Lower chest and abdominal wall: There is a chronic large hiatal hernia with intrathoracic stomach. The GE junction is above the hiatus with chronic organoaxial volvulus. There is extraluminal gas, fat inflammation, and fluid around the stomach (which is nearly entirely visualized on the delayed phase). The stomach is not overdistended to suggest obstruction. No necrotic segment of bowel or pneumatosis is seen. Gastric vessels normally opacify within the hernia sac. Umbilical hernia containing nonobstructed bowel. Hepatobiliary: No focal liver abnormality.Cholelithiasis. No evidence of biliary obstruction or inflammation. Pancreas:  Unremarkable. Spleen: Unremarkable. Adrenals/Urinary Tract: Negative adrenals. No hydronephrosis or stone. Unremarkable bladder. Stomach/Bowel: Bowel findings described above. Colonic diverticulosis. Reproductive:Fiducial markers in the central right aspect of the prostate gland which has a stable CT appearance. No pelvic adenopathy. Vascular/Lymphatic: Extensive atherosclerotic calcification. No  acute vascular abnormality. No mass or adenopathy. Musculoskeletal: Advanced lumbar facet arthropathy with L4-5 and L5-S1 anterolisthesis. Multilevel disc degeneration and spondylosis. Critical Value/emergent results were called by telephone at the time of interpretation on 04/04/2016 at 10:03 pm to Dr. Virgel Manifold , who verbally acknowledged these results. IMPRESSION: 1. Bowel perforation, presumably gastric, with extraluminal gas, fluid, and inflammation in a hiatal hernia sac. There is chronic intrathoracic stomach herniation with organoaxial volvulus. Despite the volvulus, the stomach is not obstructed or distended. 2. Bowel containing umbilical hernia without related obstruction. 3. Cholelithiasis. Electronically Signed   By: Monte Fantasia M.D.   On: 04/04/2016 22:06   Dg Chest Port 1 View  04/05/2016  CLINICAL DATA:  Postop.  Endotracheal tube. EXAM: PORTABLE CHEST 1 VIEW COMPARISON:  12/31/2015 FINDINGS: Low volume chest with streaky opacity greater on the left. Interstitial coarsening. Stable heart size. Central line on the right with tip at the SVC level. No pneumothorax. IMPRESSION: 1. Endotracheal tube and right IJ line in good position. No pneumothorax. 2. Postoperative atelectasis and pulmonary venous congestion. Electronically Signed   By: Monte Fantasia M.D.   On: 04/05/2016 03:26   Prior to Admission medications   Medication Sig Start Date End Date Taking? Authorizing Provider  acetaminophen (TYLENOL) 500 MG tablet Take 1,000 mg by mouth every 8 (eight) hours as needed (pain).   Yes Historical Provider, MD  b complex vitamins tablet Take 1 tablet by mouth daily.   Yes Historical Provider, MD  dexamethasone (DECADRON) 4 MG tablet Take 1 tablet (4 mg total) by mouth 2 (two) times daily. 03/23/16  Yes Hayden Pedro, PA-C  famotidine (PEPCID) 20 MG tablet Take 20 mg by mouth at bedtime.   Yes Historical Provider, MD  ferrous sulfate 325 (65 FE) MG tablet Take 1,300 mg by mouth every  morning.    Yes Historical Provider, MD  furosemide (LASIX) 20 MG tablet Take 20 mg by mouth daily. 02/22/16  Yes Historical Provider, MD  lamoTRIgine (LAMICTAL) 25 MG tablet Take 4 tablets by mouth 2 (two) times daily. 03/23/16  Yes Historical Provider, MD  levETIRAcetam (KEPPRA) 500 MG tablet Take 1 & 1/2 tablets twice a day 12/01/15  Yes Cameron Sprang, MD  magnesium gluconate (MAGONATE) 500 MG tablet Take 500 mg by mouth daily.   Yes Historical Provider, MD  multivitamin-lutein (OCUVITE-LUTEIN) CAPS capsule Take 1 capsule by mouth daily.   Yes Historical Provider, MD  Probiotic Product (PROBIOTIC DAILY PO) Take 1 capsule by mouth daily.    Yes Historical Provider, MD  rOPINIRole (REQUIP) 3 MG tablet Take 1.5-3 mg by mouth daily. Take 18ms at bedtime.  May take 1.556m during the day for restless legs then take the other 1.48m23mat bedtime (no more than 3 mgs daily) 11/18/14  Yes Historical Provider, MD  solifenacin (VESICARE) 5 MG tablet Take 1 tablet (5 mg total) by mouth daily. 10/18/13  Yes Daniel J Angiulli, PA-C  tamsulosin (FLOMAX) 0.4 MG CAPS capsule Take 1 capsule (0.4 mg total) by mouth daily after supper. 10/18/13  Yes Daniel J Angiulli, PA-C  valsartan (DIOVAN) 160 MG tablet Take 160 mg by mouth daily.   Yes Historical Provider, MD  vitamin B-12 (CYANOCOBALAMIN) 100 MCG tablet  Take 100 mcg by mouth daily.    Yes Historical Provider, MD  oxyCODONE (OXY IR/ROXICODONE) 5 MG immediate release tablet Take 1 tablet (5 mg total) by mouth every 4 (four) hours as needed for moderate pain. Patient not taking: Reported on 04/04/2016 01/02/16   Belkys A Regalado, MD     Medications: . sodium chloride   Intravenous Once  . sodium chloride   Intravenous Once  . antiseptic oral rinse  7 mL Mouth Rinse QID  . chlorhexidine gluconate (SAGE KIT)  15 mL Mouth Rinse BID  . famotidine (PEPCID) IV  20 mg Intravenous Q12H  . heparin  5,000 Units Subcutaneous Q8H  . levETIRAcetam  750 mg Intravenous Q12H  .  piperacillin-tazobactam (ZOSYN)  IV  3.375 g Intravenous Q8H  . sodium chloride  500 mL Intravenous Once   . dextrose 5 % and 0.9 % NaCl with KCl 20 mEq/L 100 mL/hr at 04/05/16 0600  . fentaNYL infusion INTRAVENOUS 150 mcg/hr (04/05/16 0606)  . norepinephrine (LEVOPHED) Adult infusion 25 mcg/min (04/05/16 0801)     History of prostate cancer Hypertension History of seizures History of restless leg syndrome GERD BPH  Assessment/Plan Perforated gastric ulcer posterior wall, lesser sac. Hiatal hernia with a large portion of the stomach in the chest. Status post exploratory laparotomy, repair of gastric perforation with omental patch, placement of gastrostomy tube and repair of umbilical hernia. 04/05/16 Dr. Autumn Messing. Postop ventilatory dependent respiratory failure/history of obstructive sleep apnea. Postoperative shock- hypovolemic versus septic. Acute kidney injury Anemia - Acute blood loss status post 2 units packed cells in the OR. History of Grade II right front meningioma: S/p surgical resection 02/17/15, and radiation therapy  FEN:  NPO/IV fluids ID:  Day 2 Zosyn DVT:  SCD/starting heparin at 1400 today   Plan: I'm going to recheck his labs now. CCM will handle the critical care.  He has heparin ordered but I'll check his labs morning and make a final decision on that.   LOS: 1 day    [name] 04/05/2016 [phone]

## 2016-04-05 NOTE — Progress Notes (Signed)
PACU Nsg Note: pt tx to ICU (Rm 1239) from OR per [facility] and Surgeons orders. PACU RN to ICU with pt to aid ICU staff in immediate post operative care.

## 2016-04-05 NOTE — Anesthesia Preprocedure Evaluation (Signed)
Anesthesia Evaluation  Patient identified by MRN, date of birth, ID band Patient awake    Reviewed: Allergy & Precautions, NPO status , Patient's Chart, lab work & pertinent test results  History of Anesthesia Complications Negative for: history of anesthetic complications  Airway Mallampati: III  TM Distance: >3 FB Neck ROM: Full    Dental  (+) Teeth Intact   Pulmonary shortness of breath and at rest, sleep apnea ,     + wheezing      Cardiovascular hypertension, Pt. on medications (-) angina+CHF  (-) Past MI + Valvular Problems/Murmurs AI  Rhythm:Regular Rate:Tachycardia     Neuro/Psych Seizures -, Well Controlled,  S/p crani for meningioma  Neuromuscular disease negative psych ROS   GI/Hepatic Neg liver ROS, hiatal hernia, GERD  ,Perforated bowel   Endo/Other    Renal/GU ARFRenal disease     Musculoskeletal   Abdominal   Peds  Hematology  (+) anemia ,   Anesthesia Other Findings   Reproductive/Obstetrics                             Anesthesia Physical Anesthesia Plan  ASA: IV and emergent  Anesthesia Plan: General   Post-op Pain Management:    Induction: Intravenous, Rapid sequence and Cricoid pressure planned  Airway Management Planned: Oral ETT  Additional Equipment: Arterial line, CVP and Ultrasound Guidance Line Placement  Intra-op Plan:   Post-operative Plan: Post-operative intubation/ventilation  Informed Consent: I have reviewed the patients History and Physical, chart, labs and discussed the procedure including the risks, benefits and alternatives for the proposed anesthesia with the patient or authorized representative who has indicated his/her understanding and acceptance.   Dental advisory given  Plan Discussed with: CRNA and Surgeon  Anesthesia Plan Comments:         Anesthesia Quick Evaluation

## 2016-04-05 NOTE — Progress Notes (Signed)
PULMONARY / CRITICAL CARE MEDICINE   Name: [name] MRN: [id number] DOB: 1931-04-29    ADMISSION DATE:  04/04/2016 CONSULTATION DATE:  04/05/2016  REFERRING MD:  Dr. Marlou Starks CCS  CHIEF COMPLAINT:  Bowel perforation  HISTORY OF PRESENT ILLNESS:  Patient is encephalopathic and/or intubated. Therefore history has been obtained from chart review. 82 year old male with PMH as below, which is significant for prostate Ca, GERD, HTN, OSA, and Seizures. He presented to [facility] with complaints of severe, sudden onset periumbilical pain starting [id number] afternoon. CT scan suggested a likely gastric perforation and large hiatal hernia. He was taken emergently to the OR under Dr. Marlou Starks. He underwent Ex-lap with repair of gastric perforation, repair of umbilical hernia, and placement of gastrostomy tube. Intraoperative course complicated by hypotension requiring 5L crystalloid and two units PRBC. Post-operatively he was transported to ICU for recovery and PCCM has been asked to see for further evaluation.   SUBJECTIVE:  Sedated. Passed swallow eval but still on high dose Levophed.   VITAL SIGNS: BP 140/71 mmHg  Pulse 101  Temp(Src) 99.4 F (37.4 C) (Axillary)  Resp 20  Ht 5\' 8"  (1.727 m)  SpO2 100%  HEMODYNAMICS: CVP:  [3 mmHg-10 mmHg] 7 mmHg  VENTILATOR SETTINGS: Vent Mode:  [-] PRVC FiO2 (%):  [30 %-50 %] 30 % Set Rate:  [12 bmp-16 bmp] 16 bmp Vt Set:  [550 mL] 550 mL PEEP:  [5 cmH20] 5 cmH20 Pressure Support:  [5 cmH20] 5 cmH20 Plateau Pressure:  [17 cmH20-20 cmH20] 20 cmH20  INTAKE / OUTPUT: I/O last 3 completed shifts: In: 4019.1 [I.V.:3384.1; Blood:585; IV Piggyback:50] Out: [id number] [Urine:825; Drains:110; Blood:700]  PHYSICAL EXAMINATION: General: Overweight male in NAD on vent Neuro:  Sedated at times. Moves all extremities.  HEENT:  [place]/AT, PERRL, no JVD, orally intubated  Cardiovascular: Tachy, regular, no MRG Lungs:  Coarse bases. MV supported breaths, no distress.  Abdomen:   Distended, drain, G tube in place, surgical dressing in place Musculoskeletal: No acute deformity or ROM limitation Skin:  Surgical wounds to abdomen, dressings CDI  LABS:  BMET  Recent Labs Lab 04/04/16 1956  04/05/16 0109 04/05/16 0226 04/05/16 0905  NA 133*  < > 134* 135 130*  K 3.9  < > 3.4* 3.8 4.5  CL 99*  --   --  104 101  CO2 21*  --   --  21* 20*  BUN 28*  --   --  22* 24*  CREATININE 1.20  --   --  1.02 1.42*  GLUCOSE 192*  --   --  163* 174*  < > = values in this interval not displayed.  Electrolytes  Recent Labs Lab 04/04/16 1956 04/05/16 0226 04/05/16 0905  CALCIUM 9.1 7.9* 7.6*  MG  --  1.6*  --   PHOS  --  4.6  --     CBC  Recent Labs Lab 04/04/16 1956  04/05/16 0109 04/05/16 0226 04/05/16 0905  WBC 20.1*  --   --  37.0* 39.9*  HGB 12.5*  < > 10.9* 14.0 14.6  HCT 40.0  < > 32.0* 45.3 45.8  PLT 521*  --   --  279 397  < > = values in this interval not displayed.  Coag's  Recent Labs Lab 04/05/16 0300  INR 1.04    Sepsis Markers  Recent Labs Lab 04/05/16 0250 04/05/16 0550  LATICACIDVEN 6.0* 5.3*    ABG  Recent Labs Lab 04/05/16 0109 04/05/16 0220 04/05/16 [id number]  PHART 7.299* 7.299* 7.350  PCO2ART 41.8 36.6 31.7*  PO2ART 383.0* 145* 67.5*    Liver Enzymes  Recent Labs Lab 04/04/16 1956 04/05/16 0226  AST 27 42*  ALT 26 37  ALKPHOS 96 64  BILITOT 0.6 0.7  ALBUMIN 3.8 2.6*    Cardiac Enzymes  Recent Labs Lab 04/05/16 0300 04/05/16 0550  TROPONINI 0.04* 0.05*    Glucose  Recent Labs Lab 04/05/16 0348  GLUCAP 165*    Imaging Ct Abdomen Pelvis W Contrast  04/04/2016  CLINICAL DATA:  Abdominal and back pain EXAM: CT ABDOMEN AND PELVIS WITH CONTRAST TECHNIQUE: Multidetector CT imaging of the abdomen and pelvis was performed using the standard protocol following bolus administration of intravenous contrast. CONTRAST:  172mL ISOVUE-300 IOPAMIDOL (ISOVUE-300) INJECTION 61% COMPARISON:  09/11/2015 FINDINGS:  Lower chest and abdominal wall: There is a chronic large hiatal hernia with intrathoracic stomach. The GE junction is above the hiatus with chronic organoaxial volvulus. There is extraluminal gas, fat inflammation, and fluid around the stomach (which is nearly entirely visualized on the delayed phase). The stomach is not overdistended to suggest obstruction. No necrotic segment of bowel or pneumatosis is seen. Gastric vessels normally opacify within the hernia sac. Umbilical hernia containing nonobstructed bowel. Hepatobiliary: No focal liver abnormality.Cholelithiasis. No evidence of biliary obstruction or inflammation. Pancreas: Unremarkable. Spleen: Unremarkable. Adrenals/Urinary Tract: Negative adrenals. No hydronephrosis or stone. Unremarkable bladder. Stomach/Bowel: Bowel findings described above. Colonic diverticulosis. Reproductive:Fiducial markers in the central right aspect of the prostate gland which has a stable CT appearance. No pelvic adenopathy. Vascular/Lymphatic: Extensive atherosclerotic calcification. No acute vascular abnormality. No mass or adenopathy. Musculoskeletal: Advanced lumbar facet arthropathy with L4-5 and L5-S1 anterolisthesis. Multilevel disc degeneration and spondylosis. Critical Value/emergent results were called by telephone at the time of interpretation on 04/04/2016 at 10:03 pm to Dr. Virgel Manifold , who verbally acknowledged these results. IMPRESSION: 1. Bowel perforation, presumably gastric, with extraluminal gas, fluid, and inflammation in a hiatal hernia sac. There is chronic intrathoracic stomach herniation with organoaxial volvulus. Despite the volvulus, the stomach is not obstructed or distended. 2. Bowel containing umbilical hernia without related obstruction. 3. Cholelithiasis. Electronically Signed   By: Monte Fantasia M.D.   On: 04/04/2016 22:06   Dg Chest Port 1 View  04/05/2016  CLINICAL DATA:  Postop.  Endotracheal tube. EXAM: PORTABLE CHEST 1 VIEW COMPARISON:   12/31/2015 FINDINGS: Low volume chest with streaky opacity greater on the left. Interstitial coarsening. Stable heart size. Central line on the right with tip at the SVC level. No pneumothorax. IMPRESSION: 1. Endotracheal tube and right IJ line in good position. No pneumothorax. 2. Postoperative atelectasis and pulmonary venous congestion. Electronically Signed   By: Monte Fantasia M.D.   On: 04/05/2016 03:26   PCXR: bilateral atx. Good ETT placement   STUDIES:  CT abd 7/17 > Bowel perforation, presumably gastric, with extraluminal gas, fluid, and inflammation in a hiatal hernia sac. There is chronic intrathoracic stomach herniation with organoaxial volvulus. Despite the volvulus, the stomach is not obstructed or distended.  CULTURES: BC x 2 7/18 >   ANTIBIOTICS: Zosyn 7/18 >>>  SIGNIFICANT EVENTS: 1/17 admit, to OR, out on vent  LINES/TUBES: ETT 7/18 > RIJ CVL 7/18 > L rad art 7/18 >   DISCUSSION: 82 year old male take emergently to OR with perforation. Post-op to ICU for recovery in shock on levophed and on ventilator. For today the goal is a "let the dust settle day". We will work on weaning pressors, add stress  dose steroids (random cortisol 16.1), cont vent support and re-assess for weaning in the am.   ASSESSMENT / PLAN:  PULMONARY A: VDRF in post-operative setting Post-op atelectasis  OSA   P:   Full vent support F/u abg CXR for ETT placement SBT in AM if no further OR planned and off pressors  CARDIOVASCULAR A:  Septic shock H/o HTN, HLD Mild troponin leak--> looks like demand ischemia  ->LA slowly clearing  P:  Telemetry MAP goal > 17mmHg Levophed for MAP goal CVP monitoring, goal > 31mmHg  RENAL A:   AKI  P:   Follow BMP Correct electrolytes as indicated Check CMP and extended lytes now  GASTROINTESTINAL A:   Bowel perforation s/p repair Hiatal hernia s/p repair G-tube  P:   NPO Surgery to determine timing of nutrition  Pepcid BID for  SUP  HEMATOLOGIC A:   Acute blood loss on chronic iron deficient anemia s/p 2 units PRBC in OR H/o prostate Ca H/o Meningioma f/p surgical resection and xrt (on steroids for this)  P:  CBC now Transfuse for Hgb < 7 Place PAS  INFECTIOUS A:   Peritonitis w/ Septic shock  P:   ABX as above Follow cultures  ENDOCRINE A:   Chronic steroid use for intracranial edema  P:   Stress dose steroids in setting of on-going shock  NEUROLOGIC A:   Acute metabolic encephalopathy Post-op pain H/o Seizures History meningioma status post resection and radiation therapy, apparent progression 03/21/16 MRI Small L posterior convexity subdural hematoma   P:   RASS goal: -1 to -2 Home Keppra to IV Fentanyl gtt w/ PRN versed   FAMILY  - Updates:   - Inter-disciplinary family meet or Palliative Care meeting due by:  7/24  Erick Colace ACNP-BC [place] Pager # [phone] OR # [phone] if no answer   04/05/2016 9:57 AM  Attending Note:  I have examined patient, reviewed labs, studies and notes. I have discussed the case with Jerrye Bushy, and I agree with the data and plans as amended above. 30 with a history of prostate cancer (treated), hypertension, recurrent meningioma status post resection and radiation therapy. Admitted 7/17 with gastric ulcer perforation and peritonitis. Repair was performed early 7/18. He is in the ICU on mechanical ventilation, pressors for septic shock. On my evaluation he wakes to voice and stimulation, nods to questions and follows commands. Lungs are relatively clear. Regular rhythm, normal rate. Abdominal wound clean dry and intact. We will plan to continue his current antibiotics, continue mechanical ventilation with plan to transition to assessment and spontaneous breathing trials with blood pressure improves. Check a random cortisol and started. Hydrocortisone given his risk for relative adrenal insufficiency from chronic steroid use.  Goal CVP 10-12. Additional critical care time is 35 minutes.   Baltazar Apo, MD, PhD 04/05/2016, 1:18 PM [facility] [phone] or if no answer [phone]

## 2016-04-05 NOTE — Progress Notes (Signed)
Pharmacy Antibiotic Note  GOWTHAM BUGGY is a 82 y.o. male admitted on 04/04/2016 with perforated intestines and s/p surgical repair.   Pharmacy has been consulted for Zosyn dosing.  Patient received a pre-op Zosyn 3.375gm IV x 1 dose @ 22:12.  Plan: Zosyn 3.375g IV q8h (4 hour infusion).  Height: 5\' 8"  (172.7 cm) IBW/kg (Calculated) : 68.4  Temp (24hrs), Avg:97.5 F (36.4 C), Min:97.5 F (36.4 C), Max:97.5 F (36.4 C)   Recent Labs Lab 04/04/16 1956  WBC 20.1*  CREATININE 1.20    Estimated Creatinine Clearance: 51 mL/min (by C-G formula based on Cr of 1.2).    Allergies  Allergen Reactions  . Asa [Aspirin] Nausea Only  . Other Itching and Rash    Nuts  . Vicodin [Hydrocodone-Acetaminophen] Nausea Only    Antimicrobials this admission: 7/17 Zosyn >>    Thank you for allowing pharmacy to be a part of this patient's care.  Everette Rank, PharmD 04/05/2016 2:29 AM

## 2016-04-05 NOTE — Progress Notes (Signed)
eLink Physician-Brief Progress Note Patient Name: MAVEN CHALMERS DOB: Oct 23, 1930 MRN: [id number]   Date of Service  04/05/2016  HPI/Events of Note  Bigeminy persists, HD stable  eICU Interventions  Try stopping propofol, change to fentanyl and then wean off levophed     Intervention Category Major Interventions: Arrhythmia - evaluation and management  Simonne Maffucci 04/05/2016, 3:13 AM

## 2016-04-05 NOTE — Transfer of Care (Signed)
Immediate Anesthesia Transfer of Care Note  Patient: [name]  Procedure(s) Performed: Procedure(s): EXPLORATORY LAPAROTOMY, REPAIR OF GASTRIC PERFORATION WITH OMENTAL PATCH, PLACEMENT OF GASTROSTOMY TUBE (N/A)  Patient Location: ICU  Anesthesia Type:General  Level of Consciousness: Patient remains intubated per anesthesia plan  Airway & Oxygen Therapy: Patient remains intubated per anesthesia plan  Post-op Assessment: Report given to RN and Post -op Vital signs reviewed and stable  Post vital signs: Reviewed and stable  Last Vitals:  Filed Vitals:   04/04/16 2100 04/04/16 2155  BP: 144/74 151/73  Pulse: 118 115  Temp:    Resp: 33 31    Last Pain:  Filed Vitals:   04/05/16 0203  PainSc: 10-Worst pain ever         Complications: No apparent anesthesia complications

## 2016-04-05 NOTE — Progress Notes (Signed)
CRITICAL VALUE ALERT  Critical value received:  Troponin 0.04  Date of notification:  04/05/16  Time of notification:  [id number]  Critical value read back:Yes.    Nurse who received alert:  Kennis Carina, RN  MD notified (1st page):  McQuaid  Time of first page:  0345  MD notified (2nd page):  Time of second page:  Responding MD:  Lake Bells  Time MD responded:  [phone]

## 2016-04-05 NOTE — Op Note (Signed)
04/04/2016 - 04/05/2016  1:24 AM  PATIENT:  [name]  82 y.o. male  PRE-OPERATIVE DIAGNOSIS:  perforated gastric ulcer  POST-OPERATIVE DIAGNOSIS:  perforated gastric ulcer, umbilical hernia  PROCEDURE:  Procedure(s): EXPLORATORY LAPAROTOMY, REPAIR OF GASTRIC PERFORATION WITH OMENTAL PATCH, PLACEMENT OF GASTROSTOMY TUBE (N/A) Repair of umbilical hernia  SURGEON:  Surgeon(s) and Role:    * Autumn Messing III, MD - Primary  PHYSICIAN ASSISTANT:   ASSISTANTS: none   ANESTHESIA:   general  EBL:  Total I/O In: [id number] [I.V.:3000; Blood:335] Out: 1000 [Urine:300; Blood:700]  BLOOD ADMINISTERED:none  DRAINS: (1) Jackson-Pratt drain(s) with closed bulb suction in the lesser sac   LOCAL MEDICATIONS USED:  NONE  SPECIMEN:  No Specimen  DISPOSITION OF SPECIMEN:  N/A  COUNTS:  YES  TOURNIQUET:  * No tourniquets in log *  DICTATION: .Dragon Dictation   After informed consent was obtained the patient was brought to the operating room and placed in the supine position on the operating room table.After adequate induction of general anesthesia the patient's abdomen was prepped with ChloraPrep, allowed to dry, and draped in usual sterile manner. An appropriate timeout was performed.An upper midline incision was made with a 10 blade knife. The incision was carried through the skin and subcutaneous tissue sharply with the electrocautery until the linea alba was identified. The linea alba was incised with the electrocautery. The preperitoneal space was probed bluntly with a hemostat until the peritoneum was opened and access was gained to the abdominal cavity. The rest of the incision was opened under direct vision. The patient had a small umbilical hernia and the incision was opened through the hernia. A Bookwalter retractor was deployed.The small bowel was run from the ligament of Treitz to the ileocecal valve and no abnormalities were noted. There was a lot of dark watery fluid in the upper abdomen.  The anterior wall wall of the stomach was examined and appeared normal. There was a large hiatal hernia that we gradually reduced.There was omentum adherent to the diaphragm that we took down sharply with theBovie electrocautery. The transverse colon and splenic flexure was also adherent to thediaphragm area and this was also mobilized by a combination of blunt finger dissection and some sharp dissection with the electrocautery. The short gastrics along the greater curve of the stomach were divided using the LigaSure. We were then able to examine the posterior wall of the stomach. There was a perforation identified along the mid body of the posterior wall. This perforation was repaired with interrupted 2-0 silk stitches.A tongue of omentum was then placed over the stitches and tied in place as a patch. A 2-0 silk pursestring stitch was then placed along the mid body of the anterior wall of the stomach A small opening was made in the anterior wall of the stomach with the Bovie electrocautery. A 26 French Malecot drain was then placed through this opening into the lumen of the stomach. The pursestring stitch was cinched down and tied. The Malecot catheter was then brought through the abdominal wall in the left upper quadrant. The stomach was then tacked to the abdominal wall in 4 places around the exit site of the catheter. A 19 French round Blake drain was then brought through the abdominal wall on the right side of the abdomen. The drain was placed in the lesser sac around the area of repair. The drain was anchored to the skin with a 3-0 nylon stitch. The gastrostomy tube was then anchored to the skin  with a 2-0 nylon stitch. The abdomen was irrigated with saline. The fascia of the anterior abdominal wall was then closed with 2 #1 double-stranded loop PDS sutures. The subcutaneous tissue was packed with Kerlix gauze and sterile dressings were applied. The patient tolerated the procedure well. At the end of the case  all needle sponge and instrument counts were correct. The patient will be left intubated and taken to the ICU for further resuscitation.  PLAN OF CARE: Admit to inpatient   PATIENT DISPOSITION:  ICU - intubated and critically ill.   Delay start of Pharmacological VTE agent (>24hrs) due to surgical blood loss or risk of bleeding: no

## 2016-04-05 NOTE — Consult Note (Signed)
PULMONARY / CRITICAL CARE MEDICINE   Name: [name] MRN: [id number] DOB: 05/09/1931    ADMISSION DATE:  04/04/2016 CONSULTATION DATE:  04/05/2016  REFERRING MD:  Dr. Marlou Starks CCS  CHIEF COMPLAINT:  Bowel perforation  HISTORY OF PRESENT ILLNESS:  Patient is encephalopathic and/or intubated. Therefore history has been obtained from chart review. 82 year old male with PMH as below, which is significant for prostate Ca, GERD, HTN, OSA, and Seizures. He presented to [facility] with complaints of severe, sudden onset periumbilical pain starting [id number] afternoon. CT scan suggested a likely gastric perforation and large hiatal hernia. He was taken emergently to the OR under Dr. Marlou Starks. He underwent Ex-lap with repair of gastric perforation, repair of umbilical hernia, and placement of gastrostomy tube. Intraoperative course complicated by hypotension requiring 5L crystalloid and two units PRBC. Post-operatively he was transported to ICU for recovery and PCCM has been asked to see for further evaluation.   PAST MEDICAL HISTORY :  He  has a past medical history of Anemia; Prostate cancer ([place]); Basal cell cancer; Hyperlipidemia; Restless leg; GERD (gastroesophageal reflux disease); Brain tumor ([place]); History of radiation therapy; Hypertension; Herniated disc; OSA (obstructive sleep apnea) (10/28/2014); Meningioma ([place]); Hiatal hernia; Occult blood in stools; Nocturia; Aortic insufficiency (08/11/2015); and Seizures ([place]).  PAST SURGICAL HISTORY: He  has past surgical history that includes Joint replacement; Bilateral cateract surgery; Vasectomy; Colonoscopy; Craniotomy (Right, 09/27/2013); Prostate biopsy; Tonsillectomy; Craniotomy (Right, A999333); and Application of cranial navigation (N/A, 02/17/2015).  Allergies  Allergen Reactions  . Asa [Aspirin] Nausea Only  . Other Itching and Rash    Nuts  . Vicodin [Hydrocodone-Acetaminophen] Nausea Only    No current facility-administered medications on file prior to  encounter.   Current Outpatient Prescriptions on File Prior to Encounter  Medication Sig  . acetaminophen (TYLENOL) 500 MG tablet Take 1,000 mg by mouth every 8 (eight) hours as needed (pain).  Marland Kitchen b complex vitamins tablet Take 1 tablet by mouth daily.  Marland Kitchen dexamethasone (DECADRON) 4 MG tablet Take 1 tablet (4 mg total) by mouth 2 (two) times daily.  . famotidine (PEPCID) 20 MG tablet Take 20 mg by mouth at bedtime.  . ferrous sulfate 325 (65 FE) MG tablet Take 1,300 mg by mouth every morning.   . furosemide (LASIX) 20 MG tablet Take 20 mg by mouth daily.  Marland Kitchen lamoTRIgine (LAMICTAL) 25 MG tablet Take 4 tablets by mouth 2 (two) times daily.  Marland Kitchen levETIRAcetam (KEPPRA) 500 MG tablet Take 1 & 1/2 tablets twice a day  . magnesium gluconate (MAGONATE) 500 MG tablet Take 500 mg by mouth daily.  . multivitamin-lutein (OCUVITE-LUTEIN) CAPS capsule Take 1 capsule by mouth daily.  . Probiotic Product (PROBIOTIC DAILY PO) Take 1 capsule by mouth daily.   Marland Kitchen rOPINIRole (REQUIP) 3 MG tablet Take 1.5-3 mg by mouth daily. Take 3mg s at bedtime.  May take 1.5mg s during the day for restless legs then take the other 1.5mg s at bedtime (no more than 3 mgs daily)  . solifenacin (VESICARE) 5 MG tablet Take 1 tablet (5 mg total) by mouth daily.  . tamsulosin (FLOMAX) 0.4 MG CAPS capsule Take 1 capsule (0.4 mg total) by mouth daily after supper.  . valsartan (DIOVAN) 160 MG tablet Take 160 mg by mouth daily.  . vitamin B-12 (CYANOCOBALAMIN) 100 MCG tablet Take 100 mcg by mouth daily.   Marland Kitchen oxyCODONE (OXY IR/ROXICODONE) 5 MG immediate release tablet Take 1 tablet (5 mg total) by mouth every 4 (four) hours as needed for  moderate pain. (Patient not taking: Reported on 04/04/2016)    FAMILY HISTORY:  His indicated that his mother is deceased. He indicated that his father is deceased. He indicated that his brother is alive.   SOCIAL HISTORY: He  reports that he has never smoked. He quit smokeless tobacco use about 42 years ago.  His smokeless tobacco use included Chew. He reports that he does not drink alcohol or use illicit drugs.  REVIEW OF SYSTEMS:   unable  SUBJECTIVE:  Unable to obtain  VITAL SIGNS: BP 151/73 mmHg  Pulse 115  Temp(Src) 97.5 F (36.4 C) (Oral)  Resp 31  SpO2 95%  HEMODYNAMICS:    VENTILATOR SETTINGS:    INTAKE / OUTPUT:    PHYSICAL EXAMINATION: General: Overweight male in NAD on vent Neuro:  Sedated HEENT:  [place]/AT, PERRL, no JVD Cardiovascular: Tachy, regular, no MRG Lungs:  Coarse bases. MV supported breaths Abdomen:  Distended, drain, G tube in place, surgical dressing in place Musculoskeletal: No acute deformity or ROM limitation Skin:  Surgical wounds to abdomen, dressings CDI  LABS:  BMET  Recent Labs Lab 04/04/16 1956  NA 133*  K 3.9  CL 99*  CO2 21*  BUN 28*  CREATININE 1.20  GLUCOSE 192*    Electrolytes  Recent Labs Lab 04/04/16 1956  CALCIUM 9.1    CBC  Recent Labs Lab 04/04/16 1956  WBC 20.1*  HGB 12.5*  HCT 40.0  PLT 521*    Coag's No results for input(s): APTT, INR in the last 168 hours.  Sepsis Markers No results for input(s): LATICACIDVEN, PROCALCITON, O2SATVEN in the last 168 hours.  ABG No results for input(s): PHART, PCO2ART, PO2ART in the last 168 hours.  Liver Enzymes  Recent Labs Lab 04/04/16 1956  AST 27  ALT 26  ALKPHOS 96  BILITOT 0.6  ALBUMIN 3.8    Cardiac Enzymes No results for input(s): TROPONINI, PROBNP in the last 168 hours.  Glucose No results for input(s): GLUCAP in the last 168 hours.  Imaging Ct Abdomen Pelvis W Contrast  04/04/2016  CLINICAL DATA:  Abdominal and back pain EXAM: CT ABDOMEN AND PELVIS WITH CONTRAST TECHNIQUE: Multidetector CT imaging of the abdomen and pelvis was performed using the standard protocol following bolus administration of intravenous contrast. CONTRAST:  177mL ISOVUE-300 IOPAMIDOL (ISOVUE-300) INJECTION 61% COMPARISON:  09/11/2015 FINDINGS: Lower chest and  abdominal wall: There is a chronic large hiatal hernia with intrathoracic stomach. The GE junction is above the hiatus with chronic organoaxial volvulus. There is extraluminal gas, fat inflammation, and fluid around the stomach (which is nearly entirely visualized on the delayed phase). The stomach is not overdistended to suggest obstruction. No necrotic segment of bowel or pneumatosis is seen. Gastric vessels normally opacify within the hernia sac. Umbilical hernia containing nonobstructed bowel. Hepatobiliary: No focal liver abnormality.Cholelithiasis. No evidence of biliary obstruction or inflammation. Pancreas: Unremarkable. Spleen: Unremarkable. Adrenals/Urinary Tract: Negative adrenals. No hydronephrosis or stone. Unremarkable bladder. Stomach/Bowel: Bowel findings described above. Colonic diverticulosis. Reproductive:Fiducial markers in the central right aspect of the prostate gland which has a stable CT appearance. No pelvic adenopathy. Vascular/Lymphatic: Extensive atherosclerotic calcification. No acute vascular abnormality. No mass or adenopathy. Musculoskeletal: Advanced lumbar facet arthropathy with L4-5 and L5-S1 anterolisthesis. Multilevel disc degeneration and spondylosis. Critical Value/emergent results were called by telephone at the time of interpretation on 04/04/2016 at 10:03 pm to Dr. Virgel Manifold , who verbally acknowledged these results. IMPRESSION: 1. Bowel perforation, presumably gastric, with extraluminal gas, fluid, and inflammation in a  hiatal hernia sac. There is chronic intrathoracic stomach herniation with organoaxial volvulus. Despite the volvulus, the stomach is not obstructed or distended. 2. Bowel containing umbilical hernia without related obstruction. 3. Cholelithiasis. Electronically Signed   By: Monte Fantasia M.D.   On: 04/04/2016 22:06     STUDIES:  CT abd 7/17 > Bowel perforation, presumably gastric, with extraluminal gas, fluid, and inflammation in a hiatal hernia  sac. There is chronic intrathoracic stomach herniation with organoaxial volvulus. Despite the volvulus, the stomach is not obstructed or distended.  CULTURES: BC x 2 7/18 >   ANTIBIOTICS: Zosyn 7/18 >>>  SIGNIFICANT EVENTS: 1/17 admit, to OR, out on vent  LINES/TUBES: ETT 7/18 > RIJ CVL 7/18 > L rad art 7/18 >   DISCUSSION: 82 year old male take emergently to OR with perforation. Post-op to ICU for recovery in shock on levophed and on ventilator.   ASSESSMENT / PLAN:  PULMONARY A: VDRF in post-operative setting OSA   P:   Full vent support ABG CXR for ETT placement SBT when hemodynamically stable  CARDIOVASCULAR A:  Shock in postoperative setting - hypovolemic vs septic. S/p 5 liters and 2 units PRBC in OR H/o HTN, HLD  P:  Telemetry MAP goal > 22mmHg Levophed for MAP goal IVF to [facility] after 5L in ER and now with coarse breath sounds.  CVP monitoring, goal > 33mmHg Assess lactic acid, troponin Echo  RENAL A:   AKI  P:   Follow BMP Correct electrolytes as indicated Check CMP and extended lytes now  GASTROINTESTINAL A:   Bowel perforation s/p repair Hiatal hernia s/p repair G-tube  P:   NPO Surgery primary Pepcid BID for SUP  HEMATOLOGIC A:   Acute blood loss on chronic iron deficient anemia s/p 2 units PRBC in OR H/o prostate Ca  P:  CBC now Transfuse for Hgb < 7  INFECTIOUS A:   Peritonitis ?Septic shock  P:   ABX as above Follow cultures  ENDOCRINE A:   Chronic prednisone use  P:   Cortisol Consider stress dose steroids  NEUROLOGIC A:   Acute metabolic encephalopathy H/o Seizures  P:   RASS goal: -1 to -2 Home Keppra to IV Propofol infusion PRN fentanyl    FAMILY  - Updates:   - Inter-disciplinary family meet or Palliative Care meeting due by:  Porter, AGACNP-BC [facility] Pager [phone] or [phone]  04/05/2016 2:32 AM   Attending Note:  I have examined  patient, reviewed labs, studies and notes. I have discussed the case with Jaclynn Guarneri, and I agree with the data and plans as amended above. 82 yo with OSA, prostate CA, HTN. Taken urgently to OR for repair perforated gastric ulcer. Returned to ICU critically ill with septic shock, ventilator dependence. Currently on high dose norepi, sedated, intubated. Coarse breath sounds. abd wound dressing CDI. We will plan to support with norepi, wean as able as we treat w empiric abx for peritonitis. Lighten sedation and assess for SBT as his hemodynamics improve.  Independent critical care time is 50 minutes.   Baltazar Apo, MD, PhD 04/05/2016, 9:51 AM [facility] [phone] or if no answer [phone]

## 2016-04-05 NOTE — Progress Notes (Signed)
eLink Physician-Brief Progress Note Patient Name: [name] DOB: Jun 30, 1931 MRN: [id number]   Date of Service  04/05/2016  HPI/Events of Note  1) lactic acid 6 but BP improving after adequate volume resuscitation 2) troponin elevated 3) hypothermic  eICU Interventions  Repeat lactic acid, troponin, check 12 lead, give bear hugger     Intervention Category Major Interventions: Shock - evaluation and management;Acid-Base disturbance - evaluation and management  Simonne Maffucci 04/05/2016, 3:50 AM

## 2016-04-05 NOTE — Progress Notes (Signed)
Initial Nutrition Assessment  DOCUMENTATION CODES:   Obesity unspecified  INTERVENTION:  - RD will follow-up 7/20 - Will monitor for ability to initiate TF versus need for TPN.  NUTRITION DIAGNOSIS:   Inadequate oral intake related to inability to eat as evidenced by NPO status.  GOAL:   Provide needs based on ASPEN/SCCM guidelines  MONITOR:   Vent status, Weight trends, Labs, Skin, I & O's, Other (Comment) (Ability to start nutrition support)  REASON FOR ASSESSMENT:   Ventilator  ASSESSMENT:   82yo wm who presents with severe abdominal pain that started suddenly about 5pm today. It is associated with nausea and vomiting. No fever. He came to ER and CT shows large hiatal hernia and free air thought to possibly be coming from upper GI tract.  Pt seen for new vent. BMI indicates obesity. No family/visitors present at this time to provide information from PTA. Earlier this AM pt underwent EXPLORATORY LAPAROTOMY, REPAIR OF GASTRIC PERFORATION WITH OMENTAL PATCH, PLACEMENT OF GASTROSTOMY TUBE (N/A) Repair of umbilical hernia.  Patient is currently intubated on ventilator support MV: 14.1 L/min Temp (24hrs), Avg:97.1 F (36.2 C), Min:94.3 F (34.6 C), Max:99.4 F (37.4 C) Propofol: none  Unable to complete physical assessment at this time as Therapist, sports and RT working on pt. Chart review indicates stable weight (199-205 lbs) since November 2016. No OGT or NGT but G-tube is drainage with scant amount of blood-tinged drainage in the bag at this time. Will monitor for plan per surgery concerning route of nutrition and nutrition initiation.   Medications reviewed; 50 mg Solu-Cortef ever;y 6 hours, 750 mcg lV Keppra every 12 hours.  Labs reviewed; BUN: 22 mg/dL, Ca: 7.9 mg/dL, Mg: 1.6 mg/dL, AST elevated.   IVF: D5-NS @ 100 mL/hr (408 kcal). Drips: Levo @ 25 mcg/min, Fentanyl @ 150 mcg/hr.    Diet Order:  Diet NPO time specified  Skin:  Wound (see comment) (Abdominal surgical  incision)  Last BM:  PTA  Height:   Ht Readings from Last 1 Encounters:  04/05/16 5\' 8"  (1.727 m)    Weight:   Wt Readings from Last 1 Encounters:  03/28/16 200 lb (90.719 kg)    Ideal Body Weight:  70 kg (kg)  BMI:  30.4 kg/m2  Estimated Nutritional Needs:   Kcal:  1540-1750 (225-25 kcal/kg IBW)  Protein:  140 grams  Fluid:  >/= 1.8 L/day  EDUCATION NEEDS:   No education needs identified at this time     Jarome Matin, MS, RD, LDN Inpatient Clinical Dietitian Pager # [phone] After hours/weekend pager # [phone]

## 2016-04-05 NOTE — Progress Notes (Signed)
  Echocardiogram 2D Echocardiogram with Definity has been performed.  [name] 04/05/2016, 9:51 AM

## 2016-04-05 NOTE — Progress Notes (Signed)
eLink Physician-Brief Progress Note Patient Name: NORBERT DUFRENE DOB: 01-23-1931 MRN: [id number]   Date of Service  04/05/2016  HPI/Events of Note  Agitated despite fentanyl gtt  eICU Interventions  Add versed prn     Intervention Category Intermediate Interventions: Change in mental status - evaluation and management  Simonne Maffucci 04/05/2016, 4:34 AM

## 2016-04-05 NOTE — Progress Notes (Signed)
CRITICAL VALUE ALERT  Critical value received:  LA 6.0  Date of notification:  04/05/16   Time of notification:  [id number]  Critical value read back:Yes.    Nurse who received alert:  Kennis Carina, RN  MD notified (1st page):  [place]  Time of first page:  0345  MD notified (2nd page):  Time of second page:  Responding MD:  [place] Bells  Time MD responded:  [phone]

## 2016-04-06 ENCOUNTER — Inpatient Hospital Stay (HOSPITAL_COMMUNITY): Payer: Medicare Other

## 2016-04-06 ENCOUNTER — Encounter: Payer: Self-pay | Admitting: *Deleted

## 2016-04-06 ENCOUNTER — Telehealth: Payer: Self-pay | Admitting: *Deleted

## 2016-04-06 LAB — TYPE AND SCREEN
ABO/RH(D): O POS
ANTIBODY SCREEN: NEGATIVE
UNIT DIVISION: 0
Unit division: 0

## 2016-04-06 LAB — CBC
HEMATOCRIT: 41.3 % (ref 39.0–52.0)
HEMOGLOBIN: 13 g/dL (ref 13.0–17.0)
MCH: 27.7 pg (ref 26.0–34.0)
MCHC: 31.5 g/dL (ref 30.0–36.0)
MCV: 88.1 fL (ref 78.0–100.0)
Platelets: 298 10*3/uL (ref 150–400)
RBC: 4.69 MIL/uL (ref 4.22–5.81)
RDW: 18.1 % — ABNORMAL HIGH (ref 11.5–15.5)
WBC: 33.2 10*3/uL — AB (ref 4.0–10.5)

## 2016-04-06 LAB — BLOOD GAS, ARTERIAL
ACID-BASE DEFICIT: 3.6 mmol/L — AB (ref 0.0–2.0)
BICARBONATE: 21.6 meq/L (ref 20.0–24.0)
Drawn by: 235321
FIO2: 0.3
LHR: 16 {breaths}/min
O2 SAT: 96.9 %
PEEP/CPAP: 5 cmH2O
PH ART: 7.335 — AB (ref 7.350–7.450)
Patient temperature: 37
TCO2: 19.5 mmol/L (ref 0–100)
VT: 550 mL
pCO2 arterial: 41.7 mmHg (ref 35.0–45.0)
pO2, Arterial: 90.5 mmHg (ref 80.0–100.0)

## 2016-04-06 LAB — BASIC METABOLIC PANEL
ANION GAP: 5 (ref 5–15)
BUN: 18 mg/dL (ref 6–20)
CHLORIDE: 109 mmol/L (ref 101–111)
CO2: 21 mmol/L — ABNORMAL LOW (ref 22–32)
CREATININE: 1.21 mg/dL (ref 0.61–1.24)
Calcium: 7.4 mg/dL — ABNORMAL LOW (ref 8.9–10.3)
GFR calc non Af Amer: 54 mL/min — ABNORMAL LOW (ref >60)
Glucose, Bld: 146 mg/dL — ABNORMAL HIGH (ref 65–99)
POTASSIUM: 4.3 mmol/L (ref 3.5–5.1)
SODIUM: 135 mmol/L (ref 135–145)

## 2016-04-06 MED ORDER — CETYLPYRIDINIUM CHLORIDE 0.05 % MT LIQD
7.0000 mL | Freq: Two times a day (BID) | OROMUCOSAL | Status: DC
  Administered 2016-04-07 – 2016-04-15 (×17): 7 mL via OROMUCOSAL

## 2016-04-06 MED ORDER — FENTANYL CITRATE (PF) 100 MCG/2ML IJ SOLN
25.0000 ug | INTRAMUSCULAR | Status: DC | PRN
Start: 2016-04-06 — End: 2016-04-13
  Administered 2016-04-06 – 2016-04-09 (×15): 25 ug via INTRAVENOUS
  Filled 2016-04-06 (×15): qty 2

## 2016-04-06 NOTE — Telephone Encounter (Signed)
FYI Spouse called to "cancel this weeks [name], [name] is in the hospital.  He had emergency surgery". Wished he and family well asking they call to notify us when discharged.

## 2016-04-06 NOTE — Progress Notes (Signed)
eLink Physician-Brief Progress Note Patient Name: [name] DOB: [birth year] MRN: [id number]   Date of Service  04/06/2016  HPI/Events of Note  Arterial line no longer functioning  eICU Interventions  Remove a-line     Intervention Category Minor Interventions: Routine modifications to care plan (e.g. PRN medications for pain, fever)  Simonne Maffucci 04/06/2016, 12:13 AM

## 2016-04-06 NOTE — Procedures (Signed)
Extubation Procedure Note  Patient Details:   Name: TUG REUM DOB: 03/25/1931 MRN: [id number]   Airway Documentation:     Evaluation  O2 sats: stable throughout Complications: No apparent complications Patient did tolerate procedure well. Bilateral Breath Sounds: Rhonchi   Yes   Patient placed on 3 L [place]. Patient comfortable at this time with no complications noted.   Lamonte Sakai 04/06/2016, 11:08 AM

## 2016-04-06 NOTE — Progress Notes (Signed)
Patient ID: [name], male   DOB: 1931/05/09, 82 y.o.   MRN: [id number] 2 Days Post-Op  Subjective: Very alert and responsive on ventilator. Mild abdominal pain.  Objective: Vital signs in last 24 hours: Temp:  [97.9 F (36.6 C)-100.1 F (37.8 C)] 97.9 F (36.6 C) (07/19 0800) Pulse Rate:  [75-116] 81 (07/19 0600) Resp:  [15-23] 16 (07/19 0600) BP: (69-161)/(38-77) 139/59 mmHg (07/19 0600) SpO2:  [88 %-100 %] 100 % (07/19 0600) Arterial Line BP: (68-155)/(41-70) 98/62 mmHg (07/18 2313) FiO2 (%):  [30 %] 30 % (07/19 0400) Weight:  [95.8 kg (211 lb 3.2 oz)-98.6 kg (217 lb 6 oz)] 98.6 kg (217 lb 6 oz) (07/19 0500)    Intake/Output from previous day: 07/18 0701 - 07/19 0700 In: 6341.8 [I.V.:3776.8; IV Piggyback:2565] Out: 2555 [Urine:2290; Drains:265] Intake/Output this shift:    General appearance: alert, cooperative and on ventilator Resp: clear to auscultation bilaterally GI: mild distention. Mild appropriate tenderness. JP drainage serosanguineous. Incision/Wound: dressing changed. Wound clean, packed open  Lab Results:   Recent Labs  04/05/16 0905 04/06/16 0436  WBC 39.9* 33.2*  HGB 14.6 13.0  HCT 45.8 41.3  PLT 397 298   BMET  Recent Labs  04/05/16 0905 04/06/16 0436  NA 130* 135  K 4.5 4.3  CL 101 109  CO2 20* 21*  GLUCOSE 174* 146*  BUN 24* 18  CREATININE 1.42* 1.21  CALCIUM 7.6* 7.4*     Studies/Results: Ct Abdomen Pelvis W Contrast  04/04/2016  CLINICAL DATA:  Abdominal and back pain EXAM: CT ABDOMEN AND PELVIS WITH CONTRAST TECHNIQUE: Multidetector CT imaging of the abdomen and pelvis was performed using the standard protocol following bolus administration of intravenous contrast. CONTRAST:  181mL ISOVUE-300 IOPAMIDOL (ISOVUE-300) INJECTION 61% COMPARISON:  09/11/2015 FINDINGS: Lower chest and abdominal wall: There is a chronic large hiatal hernia with intrathoracic stomach. The GE junction is above the hiatus with chronic organoaxial volvulus.  There is extraluminal gas, fat inflammation, and fluid around the stomach (which is nearly entirely visualized on the delayed phase). The stomach is not overdistended to suggest obstruction. No necrotic segment of bowel or pneumatosis is seen. Gastric vessels normally opacify within the hernia sac. Umbilical hernia containing nonobstructed bowel. Hepatobiliary: No focal liver abnormality.Cholelithiasis. No evidence of biliary obstruction or inflammation. Pancreas: Unremarkable. Spleen: Unremarkable. Adrenals/Urinary Tract: Negative adrenals. No hydronephrosis or stone. Unremarkable bladder. Stomach/Bowel: Bowel findings described above. Colonic diverticulosis. Reproductive:Fiducial markers in the central right aspect of the prostate gland which has a stable CT appearance. No pelvic adenopathy. Vascular/Lymphatic: Extensive atherosclerotic calcification. No acute vascular abnormality. No mass or adenopathy. Musculoskeletal: Advanced lumbar facet arthropathy with L4-5 and L5-S1 anterolisthesis. Multilevel disc degeneration and spondylosis. Critical Value/emergent results were called by telephone at the time of interpretation on 04/04/2016 at 10:03 pm to Dr. Virgel Manifold , who verbally acknowledged these results. IMPRESSION: 1. Bowel perforation, presumably gastric, with extraluminal gas, fluid, and inflammation in a hiatal hernia sac. There is chronic intrathoracic stomach herniation with organoaxial volvulus. Despite the volvulus, the stomach is not obstructed or distended. 2. Bowel containing umbilical hernia without related obstruction. 3. Cholelithiasis. Electronically Signed   By: Monte Fantasia M.D.   On: 04/04/2016 22:06   Dg Chest Port 1 View  04/06/2016  CLINICAL DATA:  Intubation. EXAM: PORTABLE CHEST 1 VIEW COMPARISON:  04/05/2016. FINDINGS: Endotracheal tube 1.8 cm above the carina. Proximal repositioning of approximately 1 -2 cm should be considered. Right IJ line stable position. Heart size stable.  Low lung  volumes with persist bibasilar atelectasis and/or infiltrates. Small bilateral pleural effusions cannot be excluded. No pneumothorax. IMPRESSION: 1. Endotracheal tube 1.8 cm above the carina. Proximal repositioning of approximately 1-2 cm should be considered . Right IJ line in stable position. 2. Low lung volumes with persistent bibasilar atelectasis and/or infiltrates. Small bilateral pleural effusions cannot be excluded . Electronically Signed   By: Marcello Moores  Register   On: 04/06/2016 06:46   Dg Chest Port 1 View  04/05/2016  CLINICAL DATA:  Postop.  Endotracheal tube. EXAM: PORTABLE CHEST 1 VIEW COMPARISON:  12/31/2015 FINDINGS: Low volume chest with streaky opacity greater on the left. Interstitial coarsening. Stable heart size. Central line on the right with tip at the SVC level. No pneumothorax. IMPRESSION: 1. Endotracheal tube and right IJ line in good position. No pneumothorax. 2. Postoperative atelectasis and pulmonary venous congestion. Electronically Signed   By: Monte Fantasia M.D.   On: 04/05/2016 03:26    Anti-infectives: Anti-infectives    Start     Dose/Rate Route Frequency Ordered Stop   04/05/16 0400  piperacillin-tazobactam (ZOSYN) IVPB 3.375 g     3.375 g 12.5 mL/hr over 240 Minutes Intravenous Every 8 hours 04/05/16 0229     04/04/16 2215  piperacillin-tazobactam (ZOSYN) IVPB 3.375 g     3.375 g 100 mL/hr over 30 Minutes Intravenous  Once 04/04/16 2205 04/04/16 2242      Assessment/Plan: s/p Procedure(s): EXPLORATORY LAPAROTOMY, REPAIR OF GASTRIC PERFORATION WITH OMENTAL PATCH, PLACEMENT OF GASTROSTOMY TUBE Overall appears much improved today. Renal function normalized. Significant leukocytosis but is on steroids. Plan is for possible extubation today. Will place VAC dressing on midline wound.   LOS: 2 days    Bradie Sangiovanni T 04/06/2016

## 2016-04-06 NOTE — Progress Notes (Signed)
PULMONARY / CRITICAL CARE MEDICINE   Name: [name] MRN: [id number] DOB: 1931-06-17    ADMISSION DATE:  04/04/2016 CONSULTATION DATE:  04/05/2016  REFERRING MD:  Dr. Marlou Starks CCS  CHIEF COMPLAINT:  Bowel perforation  HISTORY OF PRESENT ILLNESS:  Patient is encephalopathic and/or intubated. Therefore history has been obtained from chart review. 82 year old male with PMH as below, which is significant for prostate Ca, GERD, HTN, OSA, and Seizures. He presented to [facility] with complaints of severe, sudden onset periumbilical pain starting [id number] afternoon. CT scan suggested a likely gastric perforation and large hiatal hernia. He was taken emergently to the OR under Dr. Marlou Starks. He underwent Ex-lap with repair of gastric perforation, repair of umbilical hernia, and placement of gastrostomy tube. Intraoperative course complicated by hypotension requiring 5L crystalloid and two units PRBC. Post-operatively he was transported to ICU for recovery and PCCM has been asked to see for further evaluation.   SUBJECTIVE:  Looks comfortable on SBT   VITAL SIGNS: BP 139/59 mmHg  Pulse 81  Temp(Src) 97.9 F (36.6 C) (Oral)  Resp 16  Ht 5\' 6"  (1.676 m)  Wt 217 lb 6 oz (98.6 kg)  BMI 35.10 kg/m2  SpO2 100%  HEMODYNAMICS: CVP:  [11 mmHg] 11 mmHg  VENTILATOR SETTINGS: Vent Mode:  [-] PRVC FiO2 (%):  [30 %] 30 % Set Rate:  [16 bmp] 16 bmp Vt Set:  [550 mL] 550 mL PEEP:  [5 cmH20] 5 cmH20 Plateau Pressure:  [15 cmH20-21 cmH20] 15 cmH20  INTAKE / OUTPUT: I/O last 3 completed shifts: In: 10360.9 [I.V.:7160.9; Blood:585; IV [id number] Out: [id number] [Urine:3115; Drains:375; Blood:700]  PHYSICAL EXAMINATION: General: Overweight male in NAD on vent-->weaning pressors Neuro:  Sedated but wakes up to gentle voice, follows commands and  Moves all extremities.  HEENT:  [place]/AT, PERRL, no JVD, orally intubated  Cardiovascular: Tachy, regular, no MRG Lungs:  Coarse bases. MV supported breaths, no distress.   Abdomen:  Distended, drain, G tube in place, surgical dressing in place Musculoskeletal: No acute deformity or ROM limitation Skin:  Surgical wounds to abdomen, dressings CDI  LABS:  BMET  Recent Labs Lab 04/05/16 0226 04/05/16 0905 04/06/16 0436  NA 135 130* 135  K 3.8 4.5 4.3  CL 104 101 109  CO2 21* 20* 21*  BUN 22* 24* 18  CREATININE 1.02 1.42* 1.21  GLUCOSE 163* 174* 146*    Electrolytes  Recent Labs Lab 04/05/16 0226 04/05/16 0905 04/06/16 0436  CALCIUM 7.9* 7.6* 7.4*  MG 1.6*  --   --   PHOS 4.6  --   --     CBC  Recent Labs Lab 04/05/16 0226 04/05/16 0905 04/06/16 0436  WBC 37.0* 39.9* 33.2*  HGB 14.0 14.6 13.0  HCT 45.3 45.8 41.3  PLT 279 397 298    Coag's  Recent Labs Lab 04/05/16 0300  INR 1.04    Sepsis Markers  Recent Labs Lab 04/05/16 0250 04/05/16 0550  LATICACIDVEN 6.0* 5.3*    ABG  Recent Labs Lab 04/05/16 0220 04/05/16 0338 04/06/16 0429  PHART 7.299* 7.350 7.335*  PCO2ART 36.6 31.7* 41.7  PO2ART 145* 67.5* 90.5    Liver Enzymes  Recent Labs Lab 04/04/16 1956 04/05/16 0226  AST 27 42*  ALT 26 37  ALKPHOS 96 64  BILITOT 0.6 0.7  ALBUMIN 3.8 2.6*    Cardiac Enzymes  Recent Labs Lab 04/05/16 0300 04/05/16 0550  TROPONINI 0.04* 0.05*    Glucose  Recent Labs Lab 04/05/16 0348  GLUCAP 165*    Imaging Dg Chest Port 1 View  04/06/2016  CLINICAL DATA:  Intubation. EXAM: PORTABLE CHEST 1 VIEW COMPARISON:  04/05/2016. FINDINGS: Endotracheal tube 1.8 cm above the carina. Proximal repositioning of approximately 1 -2 cm should be considered. Right IJ line stable position. Heart size stable. Low lung volumes with persist bibasilar atelectasis and/or infiltrates. Small bilateral pleural effusions cannot be excluded. No pneumothorax. IMPRESSION: 1. Endotracheal tube 1.8 cm above the carina. Proximal repositioning of approximately 1-2 cm should be considered . Right IJ line in stable position. 2. Low lung  volumes with persistent bibasilar atelectasis and/or infiltrates. Small bilateral pleural effusions cannot be excluded . Electronically Signed   By: Marcello Moores  Register   On: 04/06/2016 06:46   PCXR: bilateral atx. Good ETT placement -->maybe a little worse c/w 7/18  STUDIES:  CT abd 7/17 > Bowel perforation, presumably gastric, with extraluminal gas, fluid, and inflammation in a hiatal hernia sac. There is chronic intrathoracic stomach herniation with organoaxial volvulus. Despite the volvulus, the stomach is not obstructed or distended. ECHO 7/19: EF 65-70%, moderate concentric hypertrophy, No WM abnormalities, doppler parameters c/w grade I diastolic dysfxn  CULTURES: BC x 2 7/18 >   ANTIBIOTICS: Zosyn 7/18 >>>  SIGNIFICANT EVENTS: 1/17 admit, to OR, out on vent  LINES/TUBES: ETT 7/18 > RIJ CVL 7/18 > L rad art 7/18 >7/19   DISCUSSION: 82 year old male take emergently to OR with perforation. Post-op to ICU for recovery in shock on levophed and on ventilator. Looks good today. We are weaning pressors, he is pulling good Vts. Should be able to come off vent. For today plan will be to extubate, cont to wean pressors, wean off sedation and change to PRN, focus on pulmonary hygiene and establish reasonable pain regimen. Per surgical team to start nutrition in next 48hrs or so.   ASSESSMENT / PLAN:  PULMONARY A: VDRF in post-operative setting Post-op atelectasis  OSA  Awake, follows commands. Weaning w/ good Vt on F/Vt.  P:   Cont SBT and hope to extubate this AM pulm hygiene  Wean O2 HS CPAP as narcotics will worsen baseline sleep apnea   CARDIOVASCULAR A:  Septic shock H/o HTN, HLD Mild troponin leak--> looks like demand ischemia  Grade I diastolic dysfxn per echo [id number] ->LA slowly clearing  P:  Telemetry MAP goal > 49mmHg Levophed for MAP goal/ cont stress dose steroids  CVP monitoring, goal > 74mmHg  RENAL A:   AKI-->resolved  P:   Follow BMP Correct  electrolytes as indicated Avoid hypotension   GASTROINTESTINAL A:   Bowel perforation s/p repair Hiatal hernia s/p repair G-tube  P:   NPO--> per surgical team can start nutrition via gut in next 48 hrs or so Surgery to determine timing of nutrition  Pepcid BID for SUP  HEMATOLOGIC A:   Acute blood loss on chronic iron deficient anemia s/p 2 units PRBC in OR H/o prostate Ca H/o Meningioma f/p surgical resection and xrt (on steroids for this) On-going leukocytosis-->may be also related to steroids  P:  CBC now Transfuse for Hgb < 7 Place PAS  INFECTIOUS A:   Peritonitis w/ Septic shock  P:   ABX as above Follow cultures  ENDOCRINE A:   Chronic steroid use for intracranial edema  P:   Stress dose steroids in setting of on-going shock (no change as of 7/19)  NEUROLOGIC A:   Acute metabolic encephalopathy-->appears to have resolved Post-op pain H/o Seizures History meningioma status  post resection and radiation therapy, apparent progression 03/21/16 MRI Small L posterior convexity subdural hematoma   P:   RASS goal: - 0 Home Keppra to IV (will change back to oral when able to take) Dc sedating gtts, will Change fent to PRN, will consider PCA    FAMILY  - Updates:   - Inter-disciplinary family meet or Palliative Care meeting due by:  7/24  Erick Colace ACNP-BC [place] Pager # [phone] OR # [phone] if no answer   04/06/2016 8:53 AM   Attending Note:  I have examined patient, reviewed labs, studies and notes. I have discussed the case with Jerrye Bushy, and I agree with the data and plans as amended above. 24 with a history of prostate cancer (treated), hypertension, recurrent meningioma status post resection and radiation therapy. Admitted 7/17 with gastric ulcer perforation and peritonitis. Repair was performed early 7/18. On my evaluation today he is on mechanical ventilation, tolerating pressure support He is awake, will nod to  questions and follow commands. He has a strong cough. His norepinephrine has been weaned to 11. We will plan to extubate today. Continue current antibiotics, wean his pressors, continue empiric hydrocortisone for suspected adrenal insufficiency. Independent critical care time is 40 minutes.   Baltazar Apo, MD, PhD 04/06/2016, 11:24 AM [facility] [phone] or if no answer [phone]

## 2016-04-07 ENCOUNTER — Telehealth: Payer: Self-pay | Admitting: Oncology

## 2016-04-07 LAB — CBC
HEMATOCRIT: 35.6 % — AB (ref 39.0–52.0)
Hemoglobin: 11.3 g/dL — ABNORMAL LOW (ref 13.0–17.0)
MCH: 28.3 pg (ref 26.0–34.0)
MCHC: 31.7 g/dL (ref 30.0–36.0)
MCV: 89 fL (ref 78.0–100.0)
PLATELETS: 212 10*3/uL (ref 150–400)
RBC: 4 MIL/uL — ABNORMAL LOW (ref 4.22–5.81)
RDW: 18.3 % — AB (ref 11.5–15.5)
WBC: 23.9 10*3/uL — AB (ref 4.0–10.5)

## 2016-04-07 LAB — BASIC METABOLIC PANEL
ANION GAP: 3 — AB (ref 5–15)
BUN: 17 mg/dL (ref 6–20)
CALCIUM: 7.9 mg/dL — AB (ref 8.9–10.3)
CO2: 24 mmol/L (ref 22–32)
Chloride: 111 mmol/L (ref 101–111)
Creatinine, Ser: 1.04 mg/dL (ref 0.61–1.24)
GFR calc Af Amer: >60 mL/min (ref >60)
GFR calc non Af Amer: >60 mL/min (ref >60)
GLUCOSE: 103 mg/dL — AB (ref 65–99)
Potassium: 3.8 mmol/L (ref 3.5–5.1)
Sodium: 138 mmol/L (ref 135–145)

## 2016-04-07 MED ORDER — DIPHENHYDRAMINE HCL 50 MG/ML IJ SOLN
25.0000 mg | Freq: Once | INTRAMUSCULAR | Status: AC
  Administered 2016-04-07: 25 mg via INTRAVENOUS
  Filled 2016-04-07: qty 1

## 2016-04-07 MED ORDER — HYDRALAZINE HCL 20 MG/ML IJ SOLN: 20.0000 mg | Freq: Four times a day (QID) | INTRAMUSCULAR | Status: DC | PRN

## 2016-04-07 MED ORDER — ENALAPRILAT 1.25 MG/ML IV SOLN: 1.2500 mg | INTRAVENOUS | Status: DC | PRN

## 2016-04-07 MED ORDER — ENALAPRILAT 1.25 MG/ML IV SOLN
1.2500 mg | Freq: Four times a day (QID) | INTRAVENOUS | Status: DC | PRN
  Administered 2016-04-07: 1.25 mg via INTRAVENOUS
  Filled 2016-04-07 (×2): qty 1

## 2016-04-07 MED ORDER — ALBUTEROL SULFATE (2.5 MG/3ML) 0.083% IN NEBU
2.5000 mg | INHALATION_SOLUTION | Freq: Three times a day (TID) | RESPIRATORY_TRACT | Status: DC | PRN
  Administered 2016-04-07: 2.5 mg via RESPIRATORY_TRACT
  Filled 2016-04-07: qty 3

## 2016-04-07 MED ORDER — METOPROLOL TARTRATE 5 MG/5ML IV SOLN
5.0000 mg | Freq: Four times a day (QID) | INTRAVENOUS | Status: DC | PRN
  Administered 2016-04-07: 5 mg via INTRAVENOUS
  Filled 2016-04-07: qty 5

## 2016-04-07 MED ORDER — ALBUTEROL SULFATE (2.5 MG/3ML) 0.083% IN NEBU
2.5000 mg | INHALATION_SOLUTION | Freq: Three times a day (TID) | RESPIRATORY_TRACT | Status: DC
Start: 2016-04-08 — End: 2016-04-12
  Administered 2016-04-08 – 2016-04-12 (×14): 2.5 mg via RESPIRATORY_TRACT
  Filled 2016-04-07 (×15): qty 3

## 2016-04-07 MED ORDER — LORAZEPAM 2 MG/ML IJ SOLN
0.5000 mg | Freq: Three times a day (TID) | INTRAMUSCULAR | Status: DC | PRN
  Administered 2016-04-07: 0.5 mg via INTRAVENOUS
  Filled 2016-04-07: qty 1

## 2016-04-07 NOTE — Evaluation (Signed)
Physical Therapy Evaluation Patient Details Name: [name] MRN: [id number] DOB: 1931/01/21 Today's Date: 04/07/2016   History of Present Illness  82 yo male admitted 04/04/16 with abdominal pain. S/P EXPLORATORY LAPAROTOMY, REPAIR OF GASTRIC PERFORATION WITH OMENTAL PATCH, PLACEMENT OF GASTROSTOMY TUBE on 04/05/18  Clinical Impression  The patient  Consented to sitting at the bed edge thiis session. Had already been OOB by nursing with difficulty return to bed per RN.  The patient has been to SNF for rehab multiple times in the past and is amenable to going again. Pt admitted with above diagnosis. Pt currently with functional limitations due to the deficits listed below (see PT Problem List).  Pt will benefit from skilled PT to increase their independence and safety with mobility to allow discharge to the venue listed below.       Follow Up Recommendations SNF;Supervision/Assistance - 24 hour    Equipment Recommendations  None recommended by PT    Recommendations for Other Services       Precautions / Restrictions Precautions Precautions: Fall Precaution Comments: L gastostomy, Right  J drain, VAC abdomen      Mobility  Bed Mobility Overal bed mobility: Needs Assistance Bed Mobility: Sidelying to Sit;Sit to Sidelying   Sidelying to sit: Max assist;HOB elevated     Sit to sidelying: Max assist General bed mobility comments: ASSIST WITH TRUNK AND LEGS, +2 WOULD BE BETTER .  Transfers Overall transfer level: Needs assistance Equipment used: Rolling walker (2 wheeled) Transfers: Sit to/from Stand           General transfer comment: ATTEMPTED X 2 FROM BED WITH 1 PERSON, UNABLE TO CLEAR BED. PATIENT IS VERY FATIGUED.  Ambulation/Gait                Stairs            Wheelchair Mobility    Modified Rankin (Stroke Patients Only)       Balance Overall balance assessment: History of Falls;Needs assistance Sitting-balance support: Feet  supported;Bilateral upper extremity supported Sitting balance-Leahy Scale: Fair Sitting balance - Comments: cannot lean forward due to abd surgery                                     Pertinent Vitals/Pain Pain Assessment: Faces Faces Pain Scale: Hurts whole lot Pain Location: during bed mobility Pain Descriptors / Indicators: Discomfort;Grimacing;Guarding Pain Intervention(s): Patient requesting pain meds-RN notified;Repositioned;Limited activity within patient's tolerance;Monitored during session    [place] expects to be discharged to:: Private residence Living Arrangements: Spouse/significant other Available Help at Discharge: Family Type of Home: House Home Access: Ramped entrance     Home Layout: One level Home Equipment: Environmental consultant - 4 wheels;Walker - 2 wheels;Cane - single point Additional Comments: from home with wife    Prior Function Level of Independence: Independent with assistive device(s)               Hand Dominance        Extremity/Trunk Assessment   Upper Extremity Assessment: Generalized weakness           Lower Extremity Assessment: RLE deficits/detail   LLE Deficits / Details: grossly 3+ 5  Cervical / Trunk Assessment: Kyphotic  Communication   Communication: No difficulties  Cognition Arousal/Alertness: Awake/alert Behavior During Therapy: WFL for tasks assessed/performed Overall Cognitive Status: Within Functional Limits for tasks assessed  General Comments      Exercises        Assessment/Plan    PT Assessment Patient needs continued PT services  PT Diagnosis Difficulty walking;Generalized weakness;Acute pain   PT Problem List Decreased strength;Decreased range of motion;Decreased activity tolerance;Decreased balance;Decreased mobility;Decreased knowledge of precautions;Decreased safety awareness;Decreased knowledge of use of DME;Pain  PT Treatment Interventions DME  instruction;Gait training;Functional mobility training;Therapeutic activities;Therapeutic exercise;Patient/family education   PT Goals (Current goals can be found in the Care Plan section) Acute Rehab PT Goals Patient Stated Goal: to go to rehab but no [facility] PT Goal Formulation: With patient Time For Goal Achievement: 04/21/16 Potential to Achieve Goals: Good    Frequency Min 3X/week   Barriers to discharge        Co-evaluation               End of Session   Activity Tolerance: Patient limited by pain;Patient limited by fatigue Patient left: in bed;with call bell/phone within reach;with bed alarm set Nurse Communication: Mobility status         Time: [id number] PT Time Calculation (min) (ACUTE ONLY): 43 min   Charges:   PT Evaluation $PT Eval Moderate Complexity: 1 Procedure PT Treatments $Therapeutic Activity: 23-37 mins   PT G Codes:        Claretha Cooper 04/07/2016, 4:45 PM Tresa Endo PT [phone]

## 2016-04-07 NOTE — Progress Notes (Signed)
Pt. BP still elevated at 191/96 after vasotec. MD notified and lopressor IV and Hydralazine ordered PRN. Pt also having expiratory wheezes, albuterol PRN also ordered. Will continue to monitor.

## 2016-04-07 NOTE — Progress Notes (Signed)
Spoke with pt regarding nocturnal cpap.  Pt stated he is unable to tolerate it at home and at the hospital during past admissions.  Pt refuses to try it tonight.  Pt was advised that RT is available all night and encouraged him to call, should he change his mind.  RN aware, at bedside.

## 2016-04-07 NOTE — Progress Notes (Signed)
Date:  April 07, 2016 Chart reviewed for concurrent status and case management needs. Will continue to follow the patient for changes and needs: bowel perforation/ wbc=23.9 temp [id number], BSN, [place], [place]

## 2016-04-07 NOTE — Progress Notes (Signed)
Nutrition Follow-up  DOCUMENTATION CODES:   Obesity unspecified  INTERVENTION:  - Diet advancement as medically feasible.  - RD will follow-up 7/21.  NUTRITION DIAGNOSIS:   Inadequate oral intake related to inability to eat as evidenced by NPO status. -ongoing  GOAL:   Patient will meet greater than or equal to 90% of their needs -updated, unable to meet.  MONITOR:   Diet advancement, Weight trends, Labs, Skin, I & O's  ASSESSMENT:   82yo wm who presents with severe abdominal pain that started suddenly about 5pm today. It is associated with nausea and vomiting. No fever. He came to ER and CT shows large hiatal hernia and free air thought to possibly be coming from upper GI tract.  7/20 Pt was extubated 7/19 at ~1040 and estimated nutrition needs updated at this time d/t extubation. Pt remains NPO and per surgery note this AM, G-tube to remain to drainage. Per CCM note this AM, timing of nutrition to be per surgery and likely to be within the next 24 hours; will monitor for diet advancement and follow-up tomorrow related to the same.   Physical assessment shows mild generalized edema with no muscle or fat wasting. Pt reports good appetite PTA. He states he was at a facility for 1 month PTA and that he did not like some of the food served. He denies chewing or swallowing difficulties but sometimes at difficulty chewing tough meats at the facility.  Pt states he had some abdominal pain with coughing d/t hernia PTA but denies any abdominal pain/discomfort PTA with eating.   Medications reviewed; 50 mg IV Solu-Cortef every 6 hours, 750 mg IV Keppra BID, PRN Zofran.  Labs reviewed; CBG: 165 mg/dL this AM, Ca: 7.9 mg/dL, Mg: 1.6 mg/dL. IVF: D5-1/2 NS-20 mEq KCl @ 75 mL/hr (306 kcal).    7/18 - No family/visitors present at this time to provide information from PTA.  - Earlier this AM pt underwent EXPLORATORY LAPAROTOMY, REPAIR OF GASTRIC PERFORATION WITH OMENTAL PATCH, PLACEMENT OF  GASTROSTOMY TUBE (N/A) Repair of umbilical hernia.  - Patient is currently intubated on ventilator support; MV: 14.1 L/min; Propofol: none - Unable to complete physical assessment at this time.  - Chart review indicates stable weight (199-205 lbs) since November 2016. - No OGT or NGT but G-tube is to drainage with scant amount of blood-tinged drainage in the bag.  - Will monitor for plan per surgery concerning route of nutrition and nutrition initiation.   IVF: D5-NS @ 100 mL/hr (408 kcal). Drips: Levo @ 25 mcg/min, Fentanyl @ 150 mcg/hr.     Diet Order:  Diet NPO time specified  Skin:  Wound (see comment) (Abdominal surgical incision)  Last BM:  7/20  Height:   Ht Readings from Last 1 Encounters:  04/05/16 5\' 6"  (1.676 m)    Weight:   Wt Readings from Last 1 Encounters:  04/06/16 217 lb 6 oz (98.6 kg)    Ideal Body Weight:  70 kg (kg)  BMI:  Body mass index is 35.1 kg/(m^2).  Estimated Nutritional Needs:   Kcal:  1900-2100  Protein:  100-110 grams  Fluid:  2 L/day  EDUCATION NEEDS:   No education needs identified at this time    Jarome Matin, MS, RD, LDN Inpatient Clinical Dietitian Pager # [phone] After hours/weekend pager # [phone]

## 2016-04-07 NOTE — Progress Notes (Signed)
PULMONARY / CRITICAL CARE MEDICINE   Name: [name] MRN: [id number] DOB: [birth year]    ADMISSION DATE:  04/04/2016 CONSULTATION DATE:  04/05/2016  REFERRING MD:  Dr. Marlou Starks CCS  CHIEF COMPLAINT:  Bowel perforation  HISTORY OF PRESENT ILLNESS:  Patient is encephalopathic and/or intubated. Therefore history has been obtained from chart review. 82 year old male with PMH as below, which is significant for prostate Ca, GERD, HTN, OSA, and Seizures. He presented to [facility] with complaints of severe, sudden onset periumbilical pain starting [id number] afternoon. CT scan suggested a likely gastric perforation and large hiatal hernia. He was taken emergently to the OR under Dr. Marlou Starks. He underwent Ex-lap with repair of gastric perforation, repair of umbilical hernia, and placement of gastrostomy tube. Intraoperative course complicated by hypotension requiring 5L crystalloid and two units PRBC. Extubated 7/19  SUBJECTIVE:  Doing very well Pressors weaned to off  VITAL SIGNS: BP 158/60 mmHg  Pulse 84  Temp(Src) 98 F (36.7 C) (Oral)  Resp 19  Ht 5\' 6"  (1.676 m)  Wt 98.6 kg (217 lb 6 oz)  BMI 35.10 kg/m2  SpO2 99%  HEMODYNAMICS:    VENTILATOR SETTINGS: Vent Mode:  [-] PSV;CPAP FiO2 (%):  [30 %] 30 % PEEP:  [5 cmH20] 5 cmH20 Pressure Support:  [5 cmH20] 5 cmH20  INTAKE / OUTPUT: I/O last 3 completed shifts: In: 4295.3 [P.O.:7; I.V.:3623.3; IV [id number] Out: 2775 [Urine:2530; Drains:245]  PHYSICAL EXAMINATION: General: Overweight male in NAD Neuro:  Awake  follows commands and  Moves all extremities.  HEENT:  [place]/AT, PERRL, no JVD Cardiovascular: Tachy, regular, no MRG Lungs:  Coarse bases.  Abdomen:  Mildly distended, soft, some tenderness to palp, surgical dressing in place Musculoskeletal: No acute deformity or ROM limitation Skin:  Surgical wounds to abdomen, dressings CDI  LABS:  BMET  Recent Labs Lab 04/05/16 0905 04/06/16 0436 04/07/16 0356  NA 130* 135 138  K 4.5 4.3  3.8  CL 101 109 111  CO2 20* 21* 24  BUN 24* 18 17  CREATININE 1.42* 1.21 1.04  GLUCOSE 174* 146* 103*    Electrolytes  Recent Labs Lab 04/05/16 0226 04/05/16 0905 04/06/16 0436 04/07/16 0356  CALCIUM 7.9* 7.6* 7.4* 7.9*  MG 1.6*  --   --   --   PHOS 4.6  --   --   --     CBC  Recent Labs Lab 04/05/16 0905 04/06/16 0436 04/07/16 0356  WBC 39.9* 33.2* 23.9*  HGB 14.6 13.0 11.3*  HCT 45.8 41.3 35.6*  PLT 397 298 212    Coag's  Recent Labs Lab 04/05/16 0300  INR 1.04    Sepsis Markers  Recent Labs Lab 04/05/16 0250 04/05/16 0550  LATICACIDVEN 6.0* 5.3*    ABG  Recent Labs Lab 04/05/16 0220 04/05/16 0338 04/06/16 0429  PHART 7.299* 7.350 7.335*  PCO2ART 36.6 31.7* 41.7  PO2ART 145* 67.5* 90.5    Liver Enzymes  Recent Labs Lab 04/04/16 1956 04/05/16 0226  AST 27 42*  ALT 26 37  ALKPHOS 96 64  BILITOT 0.6 0.7  ALBUMIN 3.8 2.6*    Cardiac Enzymes  Recent Labs Lab 04/05/16 0300 04/05/16 0550  TROPONINI 0.04* 0.05*    Glucose  Recent Labs Lab 04/05/16 0348  GLUCAP 165*    Imaging No results found. PCXR: bilateral atx. Good ETT placement -->maybe a little worse c/w 7/18  STUDIES:  CT abd 7/17 > Bowel perforation, presumably gastric, with extraluminal gas, fluid, and inflammation in a hiatal  hernia sac. There is chronic intrathoracic stomach herniation with organoaxial volvulus. Despite the volvulus, the stomach is not obstructed or distended. ECHO 7/19: EF 65-70%, moderate concentric hypertrophy, No WM abnormalities, doppler parameters c/w grade I diastolic dysfxn  CULTURES: BC x 2 7/18 > ? Not sent  ANTIBIOTICS: Zosyn 7/18 >>>  SIGNIFICANT EVENTS: 1/17 admit, to OR, out on vent  LINES/TUBES: ETT 7/18 > 7/19 RIJ CVL 7/18 > L rad art 7/18 >7/19   DISCUSSION: 82 year old male take emergently to OR with perforation. Post-op to ICU for recovery in shock on levophed and on ventilator. Looks good today. We are  weaning pressors, he is pulling good Vts. Should be able to come off vent. For today plan will be to extubate, cont to wean pressors, wean off sedation and change to PRN, focus on pulmonary hygiene and establish reasonable pain regimen. Per surgical team to start nutrition in next 48hrs or so.   ASSESSMENT / PLAN:  PULMONARY A: VDRF in post-operative setting Post-op atelectasis  OSA  Awake, follows commands. Weaning w/ good Vt on F/Vt.  P:   pulm hygiene  Wean O2 HS CPAP as narcotics will worsen baseline sleep apnea   CARDIOVASCULAR A:  Septic shock, resolved H/o HTN, HLD Mild troponin leak--> looks like demand ischemia  Grade I diastolic dysfxn per echo [id number]  P:  Telemetry SBP goal > 100 Levophed off CVP monitoring, goal > 78mmHg Home BP regimen on hold, restart when able to take PO  RENAL A:   AKI-->resolved  P:   Follow BMP Correct electrolytes as indicated Avoid hypotension   GASTROINTESTINAL A:   Bowel perforation s/p repair Hiatal hernia s/p repair G-tube  P:   NPO--> per surgical team can start nutrition via gut in next 24 hrs or so Surgery to determine timing of nutrition  Pepcid BID for SUP  HEMATOLOGIC A:   Acute blood loss on chronic iron deficient anemia s/p 2 units PRBC in OR H/o prostate Ca H/o Meningioma f/p surgical resection and xrt (on steroids for this) On-going leukocytosis-->may be also related to steroids  P:  Follow CBC  Transfuse for Hgb < 7 PAS hose  INFECTIOUS A:   Peritonitis w/ Septic shock  P:   ABX as above Follow cultures, ? If blood cx ever sent  ENDOCRINE A:   Chronic steroid use for intracranial edema  P:   Stress dose steroids until able to take PO, then back to dexamethasone 4mg  bid  NEUROLOGIC A:   Acute metabolic encephalopathy-->appears to have resolved Post-op pain H/o Seizures History meningioma status post resection and radiation therapy, apparent progression 03/21/16 MRI Small L posterior  convexity subdural hematoma Restless leg syndrome   P:   RASS goal: - 0 Home Keppra to IV (will change back to oral when able to take) Restart Requip when tolerating PO's   FAMILY  - Updates: updated wife at bedside 7/20 am  - Inter-disciplinary family meet or Palliative Care meeting due by:  7/24  PCCM will sign off. Please call if we cam assist in any way  Baltazar Apo, MD, PhD 04/07/2016, 8:57 AM [facility] [phone] or if no answer [phone]

## 2016-04-07 NOTE — Progress Notes (Signed)
Patient ID: MURTAZA DIOS, male   DOB: 06/13/1931, 82 y.o.   MRN: [id number] 3 Days Post-Op  Subjective: Extubated. Alert and comfortable. Denies abdominal pain.  Objective: Vital signs in last 24 hours: Temp:  [97.9 F (36.6 C)-99 F (37.2 C)] 98.9 F (37.2 C) (07/20 0338) Pulse Rate:  [78-117] 82 (07/20 0600) Resp:  [11-27] 18 (07/20 0600) BP: (65-166)/(33-83) 142/68 mmHg (07/20 0600) SpO2:  [96 %-100 %] 100 % (07/20 0600) FiO2 (%):  [30 %] 30 % (07/19 0908)    Intake/Output from previous day: 07/19 0701 - 07/20 0700 In: 2388.2 [P.O.:7; I.V.:1966.2; IV Piggyback:415] Out: [id number] [Urine:1330; Drains:180] Intake/Output this shift:    General appearance: alert, cooperative and no distress Resp: clear anteriorly without increased work of breathing GI: soft with mild appropriate tenderness. Clear gastrostomy drainage Incision/Wound: packed open. No erythema or drainage  Lab Results:   Recent Labs  04/06/16 0436 04/07/16 0356  WBC 33.2* 23.9*  HGB 13.0 11.3*  HCT 41.3 35.6*  PLT 298 212   BMET  Recent Labs  04/06/16 0436 04/07/16 0356  NA 135 138  K 4.3 3.8  CL 109 111  CO2 21* 24  GLUCOSE 146* 103*  BUN 18 17  CREATININE 1.21 1.04  CALCIUM 7.4* 7.9*     Studies/Results: Dg Chest Port 1 View  04/06/2016  CLINICAL DATA:  Intubation. EXAM: PORTABLE CHEST 1 VIEW COMPARISON:  04/05/2016. FINDINGS: Endotracheal tube 1.8 cm above the carina. Proximal repositioning of approximately 1 -2 cm should be considered. Right IJ line stable position. Heart size stable. Low lung volumes with persist bibasilar atelectasis and/or infiltrates. Small bilateral pleural effusions cannot be excluded. No pneumothorax. IMPRESSION: 1. Endotracheal tube 1.8 cm above the carina. Proximal repositioning of approximately 1-2 cm should be considered . Right IJ line in stable position. 2. Low lung volumes with persistent bibasilar atelectasis and/or infiltrates. Small bilateral pleural effusions  cannot be excluded . Electronically Signed   By: Marcello Moores  Register   On: 04/06/2016 06:46    Anti-infectives: Anti-infectives    Start     Dose/Rate Route Frequency Ordered Stop   04/05/16 0400  piperacillin-tazobactam (ZOSYN) IVPB 3.375 g     3.375 g 12.5 mL/hr over 240 Minutes Intravenous Every 8 hours 04/05/16 0229     04/04/16 2215  piperacillin-tazobactam (ZOSYN) IVPB 3.375 g     3.375 g 100 mL/hr over 30 Minutes Intravenous  Once 04/04/16 2205 04/04/16 2242      Assessment/Plan: s/p Procedure(s): EXPLORATORY LAPAROTOMY, REPAIR OF GASTRIC PERFORATION WITH OMENTAL PATCH, PLACEMENT OF GASTROSTOMY TUBE Doing remarkably well postop. Out of bed today. Continue gastrostomy to drain for now.  VAC wound dressing ordered.   LOS: 3 days    Royden Bulman T 04/07/2016

## 2016-04-07 NOTE — Consult Note (Signed)
[facility] Nurse wound consult note Reason for Consult: Midline surgical incision. NPWT (VAC) application at bedside.  Wound type:Surgical wound Pressure Ulcer POA: N/A Measurement:15 cm x 3 cm x 2.2 cm Wound [id number], moist and nongranulating.  Sutures visible.  Drainage (amount, consistency, odor) Moderate serosanguinous  No odor.  Periwound:Intact  JP drain and new Gastrostomy tube noted.  Dressing procedure/placement/frequency:Cleanse midline abdominal wound with NS and pat gently dry.  Fill wound bed with black Granufoam.  Cover with drape.  Change Mon/Wed/Fri. Bedside nurse may change.  [facility] team will not follow but will remain available as needed.  Domenic Moras RN BSN [place] Pager [phone]

## 2016-04-07 NOTE — Progress Notes (Signed)
Pt BP elevated to 176/92. MD notified. Vasotec IV ordered as well as ativan PRN for restlessness. Will continue to monitor.

## 2016-04-07 NOTE — Telephone Encounter (Signed)
pt wife called, pt has be in hospital since 7/17 wanted all apt canceled.

## 2016-04-08 ENCOUNTER — Inpatient Hospital Stay (HOSPITAL_COMMUNITY): Payer: Medicare Other

## 2016-04-08 ENCOUNTER — Ambulatory Visit: Payer: Medicare Other

## 2016-04-08 DIAGNOSIS — R410 Disorientation, unspecified: Secondary | ICD-10-CM

## 2016-04-08 DIAGNOSIS — D320 Benign neoplasm of cerebral meninges: Secondary | ICD-10-CM

## 2016-04-08 DIAGNOSIS — N179 Acute kidney failure, unspecified: Secondary | ICD-10-CM

## 2016-04-08 DIAGNOSIS — G4733 Obstructive sleep apnea (adult) (pediatric): Secondary | ICD-10-CM

## 2016-04-08 DIAGNOSIS — D509 Iron deficiency anemia, unspecified: Secondary | ICD-10-CM

## 2016-04-08 DIAGNOSIS — R2681 Unsteadiness on feet: Secondary | ICD-10-CM

## 2016-04-08 DIAGNOSIS — I10 Essential (primary) hypertension: Secondary | ICD-10-CM

## 2016-04-08 LAB — CBC
HCT: 36.3 % — ABNORMAL LOW (ref 39.0–52.0)
HEMOGLOBIN: 11.7 g/dL — AB (ref 13.0–17.0)
MCH: 28.3 pg (ref 26.0–34.0)
MCHC: 32.2 g/dL (ref 30.0–36.0)
MCV: 87.7 fL (ref 78.0–100.0)
Platelets: 204 10*3/uL (ref 150–400)
RBC: 4.14 MIL/uL — AB (ref 4.22–5.81)
RDW: 18.1 % — ABNORMAL HIGH (ref 11.5–15.5)
WBC: 20.7 10*3/uL — AB (ref 4.0–10.5)

## 2016-04-08 LAB — BASIC METABOLIC PANEL
ANION GAP: 7 (ref 5–15)
BUN: 20 mg/dL (ref 6–20)
CHLORIDE: 109 mmol/L (ref 101–111)
CO2: 23 mmol/L (ref 22–32)
Calcium: 8.2 mg/dL — ABNORMAL LOW (ref 8.9–10.3)
Creatinine, Ser: 0.95 mg/dL (ref 0.61–1.24)
GFR calc non Af Amer: >60 mL/min (ref >60)
Glucose, Bld: 97 mg/dL (ref 65–99)
POTASSIUM: 3.3 mmol/L — AB (ref 3.5–5.1)
SODIUM: 139 mmol/L (ref 135–145)

## 2016-04-08 MED ORDER — ALBUTEROL SULFATE (2.5 MG/3ML) 0.083% IN NEBU
2.5000 mg | INHALATION_SOLUTION | RESPIRATORY_TRACT | Status: DC | PRN
  Administered 2016-04-08: 2.5 mg via RESPIRATORY_TRACT
  Filled 2016-04-08: qty 3

## 2016-04-08 MED ORDER — FUROSEMIDE 10 MG/ML IJ SOLN
20.0000 mg | Freq: Once | INTRAMUSCULAR | Status: AC
  Administered 2016-04-08: 20 mg via INTRAVENOUS
  Filled 2016-04-08: qty 2

## 2016-04-08 MED ORDER — SODIUM CHLORIDE 0.9% FLUSH
10.0000 mL | INTRAVENOUS | Status: DC | PRN
  Administered 2016-04-10 – 2016-04-11 (×2): 10 mL
  Filled 2016-04-08 (×2): qty 40

## 2016-04-08 MED ORDER — HYDROCORTISONE NA SUCCINATE PF 100 MG IJ SOLR
50.0000 mg | Freq: Two times a day (BID) | INTRAMUSCULAR | Status: DC
  Administered 2016-04-08 – 2016-04-09 (×3): 50 mg via INTRAVENOUS
  Filled 2016-04-08 (×3): qty 2

## 2016-04-08 MED ORDER — SODIUM CHLORIDE 0.9 % IV SOLN
510.0000 mg | Freq: Once | INTRAVENOUS | Status: AC
  Administered 2016-04-08: 510 mg via INTRAVENOUS
  Filled 2016-04-08: qty 17

## 2016-04-08 MED ORDER — HALOPERIDOL LACTATE 5 MG/ML IJ SOLN
INTRAMUSCULAR | Status: AC
  Filled 2016-04-08: qty 1

## 2016-04-08 MED ORDER — POTASSIUM CHLORIDE 10 MEQ/100ML IV SOLN
10.0000 meq | INTRAVENOUS | Status: AC
  Administered 2016-04-08 (×4): 10 meq via INTRAVENOUS
  Filled 2016-04-08 (×2): qty 100

## 2016-04-08 MED ORDER — HALOPERIDOL LACTATE 5 MG/ML IJ SOLN
5.0000 mg | Freq: Three times a day (TID) | INTRAMUSCULAR | Status: DC | PRN
  Administered 2016-04-08 – 2016-04-09 (×3): 5 mg via INTRAVENOUS
  Filled 2016-04-08 (×2): qty 1

## 2016-04-08 MED ORDER — FUROSEMIDE 10 MG/ML IJ SOLN
40.0000 mg | Freq: Once | INTRAMUSCULAR | Status: AC
  Administered 2016-04-08: 40 mg via INTRAVENOUS
  Filled 2016-04-08: qty 4

## 2016-04-08 MED ORDER — HYDRALAZINE HCL 20 MG/ML IJ SOLN
10.0000 mg | INTRAMUSCULAR | Status: DC | PRN
  Administered 2016-04-09: 10 mg via INTRAVENOUS
  Filled 2016-04-08: qty 1

## 2016-04-08 MED ORDER — POTASSIUM CHLORIDE 10 MEQ/100ML IV SOLN
INTRAVENOUS | Status: AC
  Filled 2016-04-08: qty 100

## 2016-04-08 NOTE — Progress Notes (Signed)
Pt becoming very agitated and restless. Attempting to get out of bed and pull tubes despite the use of safety mitts. MD notified. New orders given. Will continue to monitor.

## 2016-04-08 NOTE — Progress Notes (Signed)
Patient ID: [name], male   DOB: 06/25/1931, 82 y.o.   MRN: [id number] 4 Days Post-Op  Subjective: States he is "confused". Noted to have some delirium overnight and some wheezing. Received Lasix and breathing treatment overnight without much change.  Objective: Vital signs in last 24 hours: Temp:  [97.6 F (36.4 C)-98.9 F (37.2 C)] 97.6 F (36.4 C) (07/21 0846) Pulse Rate:  [49-105] 62 (07/21 0800) Resp:  [17-26] 18 (07/21 0800) BP: (124-191)/(54-117) 134/54 mmHg (07/21 0800) SpO2:  [98 %-100 %] 100 % (07/21 0836) Weight:  [98.4 kg (216 lb 14.9 oz)] 98.4 kg (216 lb 14.9 oz) (07/21 0401) Last BM Date:  (pta)  Intake/Output from previous day: 07/20 0701 - 07/21 0700 In: 2050.1 [I.V.:1525.1; IV Piggyback:415] Out: [id number] [Urine:950; Drains:500] Intake/Output this shift: Total I/O In: 120 [I.V.:100; Other:20] Out: -   General appearance: alert, cooperative and no distress Resp: mild bilateral wheezing and some upper airway congestion without increased work of breathing GI: soft and nontender.  JP drainage serosanguineous Neurologic: oriented to person and place but not date or situation with mild confusion. No focal deficits. Incision/Wound: VAC dressing in place without erythema or unusual drainage  Lab Results:   Recent Labs  04/07/16 0356 04/08/16 0327  WBC 23.9* 20.7*  HGB 11.3* 11.7*  HCT 35.6* 36.3*  PLT 212 204   BMET  Recent Labs  04/07/16 0356 04/08/16 0327  NA 138 139  K 3.8 3.3*  CL 111 109  CO2 24 23  GLUCOSE 103* 97  BUN 17 20  CREATININE 1.04 0.95  CALCIUM 7.9* 8.2*     Studies/Results: Dg Chest Port 1 View  04/08/2016  CLINICAL DATA:  Increased wheezing and shortness of breath EXAM: PORTABLE CHEST 1 VIEW COMPARISON:  04/06/2016 FINDINGS: Chronic cardiopericardial enlargement. Stable positioning of central line with tip over the SVC. Improved lung volumes despite interval extubation. Persistent bibasilar opacity that is likely atelectasis.  The left diaphragm is now visible. No effusion or pneumothorax. IMPRESSION: Improved lung volumes and decreased atelectasis after extubation. Electronically Signed   By: Monte Fantasia M.D.   On: 04/08/2016 05:52    Anti-infectives: Anti-infectives    Start     Dose/Rate Route Frequency Ordered Stop   04/05/16 0400  piperacillin-tazobactam (ZOSYN) IVPB 3.375 g     3.375 g 12.5 mL/hr over 240 Minutes Intravenous Every 8 hours 04/05/16 0229     04/04/16 2215  piperacillin-tazobactam (ZOSYN) IVPB 3.375 g     3.375 g 100 mL/hr over 30 Minutes Intravenous  Once 04/04/16 2205 04/04/16 2242      Assessment/Plan: s/p Procedure(s): EXPLORATORY LAPAROTOMY, REPAIR OF GASTRIC PERFORATION WITH OMENTAL PATCH, PLACEMENT OF GASTROSTOMY TUBE History of brain tumor and radiation Confused last night. This seemed to be related to Ativan. This has been discontinued. Abdomen seems benign without specific complication from his ulcer closure. Clamp gastrostomy tube and start sips of clear liquids Mild wheezing and pulmonary congestion. No apparent edema. Likely related to atelectasis. Plan aggressive pulmonary toilet. Continue IV antibiotics. White count trending down.    LOS: 4 days    Devora Tortorella T 04/08/2016

## 2016-04-08 NOTE — Progress Notes (Addendum)
Nutrition Follow-up  DOCUMENTATION CODES:   Obesity unspecified  INTERVENTION:  - Diet advancement per surgery team.  - RD will follow-up 7/24.  NUTRITION DIAGNOSIS:   Inadequate oral intake related to inability to eat as evidenced by NPO status. -ongoing  GOAL:   Patient will meet greater than or equal to 90% of their needs -unable to meet.  MONITOR:   Diet advancement, Weight trends, Labs, Skin, I & O's  ASSESSMENT:   82yo wm who presents with severe abdominal pain that started suddenly about 5pm today. It is associated with nausea and vomiting. No fever. He came to ER and CT shows large hiatal hernia and free air thought to possibly be coming from upper GI tract.  7/21 Pt remains NPO this AM and no new surgery note yet in place to address nutrition. RD will monitor for notes throughout the day today and also follow-up on Monday (7/24).  Pt unable to meet needs. Weight down 1 lb since 7/19. Medications reviewed; 20 mg IV Lasix x1 dose today, 50 mg IV Solu-Cortef every 6 hours, 750 mcg IV Keppra BID, PRN Zofran. Labs reviewed; K: 3.3 mmol/L, Ca: 8.2 mg/dL, Mg: 1.6 mg/dL. IVF: D5-NS @ 75 mL/hr (306 kcal).   ADDENDUM: Per rounds this AM, G-tube to be clamped and pt to be allowed sips of clears today. If this goes well, plan is to begin advancing diet 7/22. This information was also found in Surgery MD note which was written today at 1011.    7/20 - Pt was extubated 7/19 at ~1040 and estimated nutrition needs updated at this time d/t extubation.  - Pt remains NPO and per surgery note this AM, G-tube to remain to drainage.  - Per CCM note this AM, timing of nutrition to be per surgery and likely to be within the next 24 hours.  - Physical assessment shows mild generalized edema with no muscle or fat wasting.  - Pt reports good appetite PTA. He states he was at a facility for 1 month PTA and that he did not like some of the food served.  - He denies chewing or swallowing  difficulties but sometimes at difficulty chewing tough meats at the facility. - Pt states he had some abdominal pain with coughing d/t hernia PTA but denies any abdominal pain/discomfort PTA with eating.   IVF: D5-1/2 NS-20 mEq KCl @ 75 mL/hr (306 kcal).   7/18 - No family/visitors present at this time to provide information from PTA.  - Earlier this AM pt underwent EXPLORATORY LAPAROTOMY, REPAIR OF GASTRIC PERFORATION WITH OMENTAL PATCH, PLACEMENT OF GASTROSTOMY TUBE (N/A) Repair of umbilical hernia.  - Patient is currently intubated on ventilator support; MV: 14.1 L/min; Propofol: none - Unable to complete physical assessment at this time.  - Chart review indicates stable weight (199-205 lbs) since November 2016. - No OGT or NGT but G-tube is to drainage with scant amount of blood-tinged drainage in the bag.  - Will monitor for plan per surgery concerning route of nutrition and nutrition initiation.   IVF: D5-NS @ 100 mL/hr (408 kcal). Drips: Levo @ 25 mcg/min, Fentanyl @ 150 mcg/hr   Diet Order:  Diet NPO time specified  Skin:  Wound (see comment) (Abdominal surgical incision)  Last BM:  PTA  Height:   Ht Readings from Last 1 Encounters:  04/05/16 5\' 6"  (1.676 m)    Weight:   Wt Readings from Last 1 Encounters:  04/08/16 216 lb 14.9 oz (98.4 kg)  Ideal Body Weight:  70 kg (kg)  BMI:  Body mass index is 35.03 kg/(m^2).  Estimated Nutritional Needs:   Kcal:  1900-2100  Protein:  100-110 grams  Fluid:  2 L/day  EDUCATION NEEDS:   No education needs identified at this time    Paul Matin, MS, RD, LDN Inpatient Clinical Dietitian Pager # [phone] After hours/weekend pager # [phone]

## 2016-04-08 NOTE — Progress Notes (Signed)
Pharmacy Antibiotic Note  [name] is a 82 y.o. male admitted on 04/04/2016 with perforated intestines and s/p surgical repair.   Pharmacy has been consulted for Zosyn dosing for peritonitis with septic shock.    Plan: Zosyn 3.375g IV q8h (4 hour infusion).  No further dosing adjustment anticipated, Pharmacy will sign-off  Height: 5\' 6"  (167.6 cm) Weight: 216 lb 14.9 oz (98.4 kg) IBW/kg (Calculated) : 63.8  Temp (24hrs), Avg:98 F (36.7 C), Min:97.6 F (36.4 C), Max:98.9 F (37.2 C)   Recent Labs Lab 04/05/16 0226 04/05/16 0250 04/05/16 0550 04/05/16 0905 04/06/16 0436 04/07/16 0356 04/08/16 0327  WBC 37.0*  --   --  39.9* 33.2* 23.9* 20.7*  CREATININE 1.02  --   --  1.42* 1.21 1.04 0.95  LATICACIDVEN  --  6.0* 5.3*  --   --   --   --     Estimated Creatinine Clearance: 64.7 mL/min (by C-G formula based on Cr of 0.95).    Allergies  Allergen Reactions  . Asa [Aspirin] Nausea Only  . Other Itching and Rash    Nuts  . Vicodin [Hydrocodone-Acetaminophen] Nausea Only    Antimicrobials this admission: 7/17 Zosyn >>    Microbiology results: BCx from 7/18 not sent?  Thank you for allowing pharmacy to be a part of this patient's care.  Peggyann Juba, PharmD, BCPS Pager: [phone]  04/08/2016 7:56 AM

## 2016-04-08 NOTE — Consult Note (Addendum)
Medical Consultation   IMANUEL HIRSCHFELD  [id number]  DOB: 15-Jul-1931  DOA: 04/04/2016  PCP:  Melinda Crutch, MD  Outpatient Specialists: Quinn Plowman  Requesting physician: Excell Seltzer  Reason for consultation: Acute delirium and medical management   History of Present Illness: [name] is an 82 y.o. male hospital history of HTN, OSA, meningioma and seizures. Patient presented to ED on 7/17 with severe sudden onset of periumbilical abdominal pain, CT showed gastric perforation a large hiatal hernia, patient underwent emergency surgery on the 17th. Intraoperatively Hypotension required 5 L of crystalloids and 2 units of packed RBCs. Was not estimated postoperatively so transferred to the ICU and PCCM was consulted. Patient was extubated on 04/06/2016. Overall he is doing much better, he is still NPO but allowed to take sips of clears. Patient was given Ativan last night for restless leg syndrome and developed confusion so [facility] hospitalist consulted for confusion and perioperative management.  Review of Systems:  ROS  Unable to obtain because of confusion  Past Medical History: Past Medical History  Diagnosis Date  . Anemia   . Prostate cancer ([place])   . Basal cell cancer   . Hyperlipidemia   . Restless leg   . GERD (gastroesophageal reflux disease)   . Brain tumor ([place])   . History of radiation therapy     back approximately 2010 prostate radiation under care of Dr. Valere Dross   . Hypertension   . Herniated disc     lumbar spine  . OSA (obstructive sleep apnea) 10/28/2014    Moderate with AHI 21/hr  . Meningioma ([place])     brain  . Hiatal hernia   . Occult blood in stools   . Nocturia   . Aortic insufficiency 08/11/2015  . Seizures ([place])     Past Surgical History: Past Surgical History  Procedure Laterality Date  . Joint replacement      right knee replacement  . Bilateral cateract surgery    . Vasectomy    . Colonoscopy      colon polyp removed  .  Craniotomy Right 09/27/2013    Procedure: RIGHT FRONTAL CRANIOTOMY FOR TUMOR RESECTION ;  Surgeon: Consuella Lose, MD;  Location: [facility];  Service: Neurosurgery;  Laterality: Right;  . Prostate biopsy    . Tonsillectomy      Agae 6-7  . Craniotomy Right 02/17/2015    Procedure: Right frontal parietal craniotomy for resection of meningioma with brainlab;  Surgeon: Consuella Lose, MD;  Location: [facility];  Service: Neurosurgery;  Laterality: Right;  Right frontal parietal craniotomy for resection of meningioma with brainlab  . Application of cranial navigation N/A 02/17/2015    Procedure: APPLICATION OF CRANIAL NAVIGATION;  Surgeon: Consuella Lose, MD;  Location: [facility];  Service: Neurosurgery;  Laterality: N/A;  APPLICATION OF CRANIAL NAVIGATION  . Laparotomy N/A 04/04/2016    Procedure: EXPLORATORY LAPAROTOMY, REPAIR OF GASTRIC PERFORATION WITH OMENTAL PATCH, PLACEMENT OF GASTROSTOMY TUBE;  Surgeon: Autumn Messing III, MD;  Location: [facility];  Service: General;  Laterality: N/A;     Allergies:   Allergies  Allergen Reactions  . Asa [Aspirin] Nausea Only  . Other Itching and Rash    Nuts  . Vicodin [Hydrocodone-Acetaminophen] Nausea Only     Social History:  reports that he has never smoked. He quit smokeless tobacco use about 42 years ago. His smokeless tobacco use included Chew. He reports  that he does not drink alcohol or use illicit drugs.   Family History: Family History  Problem Relation Age of Onset  . Parkinsonism Mother   . Dementia Father      Physical Exam: Filed Vitals:   04/08/16 0700 04/08/16 0800 04/08/16 0836 04/08/16 0846  BP: 162/78 134/54    Pulse: 91 62    Temp:    97.6 F (36.4 C)  TempSrc:    Oral  Resp: 20 18    Height:      Weight:      SpO2: 100% 100% 100%     Constitutional: Seen with his wife at bedside, elderly Caucasian male, awake but confused. Does not appear to be in distress. Eyes: PERLA, EOMI, irises appear normal,  anicteric sclera,  ENMT: external ears and nose appear normal            Lips appears normal, oropharynx mucosa, tongue, posterior pharynx appear normal  Neck: neck appears normal, no masses, normal ROM, no thyromegaly, no JVD  CVS: S1-S2 clear, no murmur rubs or gallops, no LE edema, normal pedal pulses  Respiratory:  clear to auscultation bilaterally, no wheezing, rales or rhonchi. Respiratory effort normal. No accessory muscle use.  Abdomen: soft nontender, nondistended, normal bowel sounds, no hepatosplenomegaly, no hernias  Musculoskeletal: : no cyanosis, clubbing or edema noted bilaterally Neuro: Cranial nerves II-XII intact, strength, sensation, reflexes Psych: judgement and insight appear normal, stable mood and affect, mental status Skin: no rashes or lesions or ulcers, no induration or nodules   Data reviewed:  I have personally reviewed following labs and imaging studies Labs:  CBC:  Recent Labs Lab 04/05/16 0226 04/05/16 0905 04/06/16 0436 04/07/16 0356 04/08/16 0327  WBC 37.0* 39.9* 33.2* 23.9* 20.7*  NEUTROABS 35.2*  --   --   --   --   HGB 14.0 14.6 13.0 11.3* 11.7*  HCT 45.3 45.8 41.3 35.6* 36.3*  MCV 90.6 87.6 88.1 89.0 87.7  PLT 279 397 298 212 0000000    Basic Metabolic Panel:  Recent Labs Lab 04/05/16 0226 04/05/16 0905 04/06/16 0436 04/07/16 0356 04/08/16 0327  NA 135 130* 135 138 139  K 3.8 4.5 4.3 3.8 3.3*  CL 104 101 109 111 109  CO2 21* 20* 21* 24 23  GLUCOSE 163* 174* 146* 103* 97  BUN 22* 24* 18 17 20   CREATININE 1.02 1.42* 1.21 1.04 0.95  CALCIUM 7.9* 7.6* 7.4* 7.9* 8.2*  MG 1.6*  --   --   --   --   PHOS 4.6  --   --   --   --    GFR Estimated Creatinine Clearance: 64.7 mL/min (by C-G formula based on Cr of 0.95). Liver Function Tests:  Recent Labs Lab 04/04/16 1956 04/05/16 0226  AST 27 42*  ALT 26 37  ALKPHOS 96 64  BILITOT 0.6 0.7  PROT 6.7 4.7*  ALBUMIN 3.8 2.6*    Recent Labs Lab 04/04/16 1956  LIPASE 19   No  results for input(s): AMMONIA in the last 168 hours. Coagulation profile  Recent Labs Lab 04/05/16 0300  INR 1.04    Cardiac Enzymes:  Recent Labs Lab 04/05/16 0300 04/05/16 0550  TROPONINI 0.04* 0.05*   BNP: Invalid input(s): POCBNP CBG:  Recent Labs Lab 04/05/16 0348  GLUCAP 165*   D-Dimer No results for input(s): DDIMER in the last 72 hours. Hgb A1c No results for input(s): HGBA1C in the last 72 hours. Lipid Profile No results for input(s): CHOL,  HDL, LDLCALC, TRIG, CHOLHDL, LDLDIRECT in the last 72 hours. Thyroid function studies No results for input(s): TSH, T4TOTAL, T3FREE, THYROIDAB in the last 72 hours.  Invalid input(s): FREET3 Anemia work up No results for input(s): VITAMINB12, FOLATE, FERRITIN, TIBC, IRON, RETICCTPCT in the last 72 hours. Urinalysis    Component Value Date/Time   COLORURINE YELLOW 04/04/2016 2052   APPEARANCEUR CLEAR 04/04/2016 2052   LABSPEC 1.026 04/04/2016 2052   PHURINE 5.0 04/04/2016 2052   GLUCOSEU NEGATIVE 04/04/2016 2052   HGBUR NEGATIVE 04/04/2016 2052   BILIRUBINUR NEGATIVE 04/04/2016 2052   KETONESUR NEGATIVE 04/04/2016 2052   PROTEINUR 30* 04/04/2016 2052   UROBILINOGEN 0.2 07/31/2014 1941   NITRITE NEGATIVE 04/04/2016 2052   LEUKOCYTESUR NEGATIVE 04/04/2016 2052     Microbiology Recent Results (from the past 240 hour(s))  MRSA PCR Screening     Status: None   Collection Time: 04/05/16  2:53 AM  Result Value Ref Range Status   MRSA by PCR NEGATIVE NEGATIVE Final    Comment:        The GeneXpert MRSA Assay (FDA approved for NASAL specimens only), is one component of a comprehensive MRSA colonization surveillance program. It is not intended to diagnose MRSA infection nor to guide or monitor treatment for MRSA infections.        Inpatient Medications:   Scheduled Meds: . albuterol  2.5 mg Nebulization TID  . antiseptic oral rinse  7 mL Mouth Rinse BID  . famotidine (PEPCID) IV  20 mg Intravenous  Q12H  . ferumoxytol  510 mg Intravenous Once  . furosemide  40 mg Intravenous Once  . heparin  5,000 Units Subcutaneous Q8H  . hydrocortisone sod succinate (SOLU-CORTEF) inj  50 mg Intravenous Q12H  . levETIRAcetam  750 mg Intravenous Q12H  . piperacillin-tazobactam (ZOSYN)  IV  3.375 g Intravenous Q8H   Continuous Infusions:    Radiological Exams on Admission: Dg Chest Port 1 View  04/08/2016  CLINICAL DATA:  Increased wheezing and shortness of breath EXAM: PORTABLE CHEST 1 VIEW COMPARISON:  04/06/2016 FINDINGS: Chronic cardiopericardial enlargement. Stable positioning of central line with tip over the SVC. Improved lung volumes despite interval extubation. Persistent bibasilar opacity that is likely atelectasis. The left diaphragm is now visible. No effusion or pneumothorax. IMPRESSION: Improved lung volumes and decreased atelectasis after extubation. Electronically Signed   By: Monte Fantasia M.D.   On: 04/08/2016 05:52    Impression/Recommendations Principal Problem:   Perforated bowel ([place]) Active Problems:   Acute kidney injury ([place])   Hypertension   Gait instability   Anemia, iron deficiency   Meningioma, recurrent of brain ([facility])   OSA (obstructive sleep apnea)   Acute delirium   Perforated bowel -Patient admitted under [facility] and underwent exploratory laparotomy on 7/17. -Improved, still has gastrostomy tube for decompression and JP drain. -Allowed sips of clears today. Hopefully diet will be advanced  Acute delirium -This is partly ICU delirium, patient received Ativan last night, he started to clear up this morning 3 -Avoid benzodiazepines, Haldol as needed for agitation. -Help with his sleep cycle, keep awake during daytime.  Ventilator dependent respiratory failure, resolved -Patient was intubated from 7/17-19, also was on Levophed and stress dose of steroids. -He is improved, off of Levophed, wall taper down the steroids. -No evidence of infection.  Acute kidney  injury -Likely associated with intraoperative hypotension, creatinine was up to 1.4, this is resolved. -Appears to be mildly fluid overloaded will repeat Lasix today.  Hypokalemia -Replete with IV supplements  as patient still nothing by mouth.  Anemia of iron deficiency -Patient was follows with the cancer center and he was receiving iron infusion. -He received transfusion of 2 units of packed RBCs not anemic currently. -He does have significant restless legs symptoms, supposed to have iron infusion, I will give Feraheme today.  Hypertension -Patient is on valsartan at home, this is on hold because of nothing by mouth. Hydralazine as needed, avoid beta blockers because of nocturnal bradycardia.  Recurrent meningioma with left-sided weakness -Had 2 surgeries and undergone radiation, per wife has resultant mild left-sided weakness and he uses walker. -PT/OT to evaluate and treat  Steroid use -Per wife he is on off dexamethasone for his meningioma and the left-sided weakness. -This time he has been on it only for 2 weeks, we'll try to wean off of Solu-Cortef, may be back to his home dose.   Time Spent: 60 minutes  Kazmir Oki A M.D. [facility] 04/08/2016, 11:03 AM

## 2016-04-08 NOTE — Progress Notes (Signed)
Patients heart rhythm changed to sinus brady, MD notified and assessed at bedside, when awoken patient converted back to NSR, MD sated may be due to sleep apnea and to continue to monitor and notify if symptomatic. RN to continue to monitor.

## 2016-04-08 NOTE — Progress Notes (Signed)
Pt having increased work of breathing and increased expiratory wheezes. MD notified. New orders given. Will continue to monitor.

## 2016-04-08 NOTE — Anesthesia Postprocedure Evaluation (Signed)
Anesthesia Post Note  Patient: [name]  Procedure(s) Performed: Procedure(s) (LRB): EXPLORATORY LAPAROTOMY, REPAIR OF GASTRIC PERFORATION WITH OMENTAL PATCH, PLACEMENT OF GASTROSTOMY TUBE (N/A)  Patient location during evaluation: ICU Anesthesia Type: General Level of consciousness: sedated Pain management: pain level controlled Vital Signs Assessment: post-procedure vital signs reviewed and stable Respiratory status: patient remains intubated per anesthesia plan Cardiovascular status: stable Postop Assessment: no signs of nausea or vomiting Anesthetic complications: no    Last Vitals:  Filed Vitals:   04/08/16 1800 04/08/16 1850  BP: 140/52   Pulse: 57   Temp:  36.6 C  Resp: 17     Last Pain:  Filed Vitals:   04/08/16 1901  PainSc: Asleep                 Makaiyah Schweiger

## 2016-04-09 DIAGNOSIS — G2581 Restless legs syndrome: Secondary | ICD-10-CM

## 2016-04-09 LAB — BASIC METABOLIC PANEL
Anion gap: 5 (ref 5–15)
BUN: 24 mg/dL — AB (ref 6–20)
CO2: 28 mmol/L (ref 22–32)
CREATININE: 0.96 mg/dL (ref 0.61–1.24)
Calcium: 7.9 mg/dL — ABNORMAL LOW (ref 8.9–10.3)
Chloride: 108 mmol/L (ref 101–111)
GFR calc Af Amer: >60 mL/min (ref >60)
Glucose, Bld: 82 mg/dL (ref 65–99)
Potassium: 2.8 mmol/L — ABNORMAL LOW (ref 3.5–5.1)
SODIUM: 141 mmol/L (ref 135–145)

## 2016-04-09 LAB — CBC
HCT: 33.7 % — ABNORMAL LOW (ref 39.0–52.0)
Hemoglobin: 10.5 g/dL — ABNORMAL LOW (ref 13.0–17.0)
MCH: 27.9 pg (ref 26.0–34.0)
MCHC: 31.2 g/dL (ref 30.0–36.0)
MCV: 89.4 fL (ref 78.0–100.0)
PLATELETS: 180 10*3/uL (ref 150–400)
RBC: 3.77 MIL/uL — ABNORMAL LOW (ref 4.22–5.81)
RDW: 18.7 % — ABNORMAL HIGH (ref 11.5–15.5)
WBC: 10.5 10*3/uL (ref 4.0–10.5)

## 2016-04-09 LAB — MAGNESIUM: MAGNESIUM: 2.2 mg/dL (ref 1.7–2.4)

## 2016-04-09 MED ORDER — ZOLPIDEM TARTRATE 5 MG PO TABS
5.0000 mg | ORAL_TABLET | Freq: Once | ORAL | Status: AC
  Administered 2016-04-09: 5 mg via ORAL
  Filled 2016-04-09: qty 1

## 2016-04-09 MED ORDER — POTASSIUM CHLORIDE 10 MEQ/100ML IV SOLN
10.0000 meq | INTRAVENOUS | Status: AC
  Administered 2016-04-09 (×6): 10 meq via INTRAVENOUS
  Filled 2016-04-09 (×5): qty 100

## 2016-04-09 MED ORDER — ROPINIROLE HCL 1 MG PO TABS: 3.0000 mg | ORAL_TABLET | Freq: Once | ORAL | Status: DC

## 2016-04-09 MED ORDER — ROPINIROLE HCL 1 MG PO TABS
3.0000 mg | ORAL_TABLET | Freq: Once | ORAL | Status: AC
  Administered 2016-04-09: 3 mg via ORAL
  Filled 2016-04-09: qty 3

## 2016-04-09 MED ORDER — LORAZEPAM 2 MG/ML IJ SOLN: 1.0000 mg | Freq: Once | INTRAMUSCULAR | Status: DC

## 2016-04-09 NOTE — NC FL2 (Signed)
[place] MEDICAID FL2 LEVEL OF CARE SCREENING TOOL     IDENTIFICATION  Patient Name: [name] Birthdate: June 20, 1931 Sex: male Admission Date (Current Location): 04/04/2016  [facility] and [place] Number:  Herbalist and Address:  [facility],  [place] [address], [place]      Provider Number: [id number]  Attending Physician Name and Address:  Md Edison Pace, MD  Relative Name and Phone Number:       Current Level of Care: Hospital Recommended Level of Care: [place] Prior Approval Number:    Date Approved/Denied:   PASRR Number: [id number]  Discharge Plan: Home    Current Diagnoses: Patient Active Problem List   Diagnosis Date Noted  . Acute delirium 04/08/2016  . Perforated bowel ([place]) 04/04/2016  . Humerus fracture 12/31/2015  . Closed right humeral fracture 12/31/2015  . Aortic insufficiency 08/11/2015  . 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 Atypical meningioma of brain ([place]) 02/17/2015  . Obesity (BMI 30-39.9) 02/13/2015  . OSA (obstructive sleep apnea) 10/28/2014  . SOB (shortness of breath) 07/31/2014  . Fatigue due to excessive exertion 07/31/2014  . Edema extremities 07/31/2014  . Hypersomnia 07/31/2014  . Meningioma, recurrent of brain ([place]) 09/27/2013  . Unspecified constipation 09/26/2013  . Anemia, iron deficiency 09/26/2013  . Dizziness 09/20/2013  . Fall at home 09/20/2013  . Dehydration 09/20/2013  . Orthostasis 09/20/2013  . CKD (chronic kidney disease), stage III 09/20/2013  . Brain mass 09/20/2013  . Gait instability 08/20/2013  . Hypokalemia 10/09/2012  . Ileus ([place]) 10/08/2012  . Acute kidney injury ([place]) 10/08/2012  . Hypertension 10/08/2012  . Hyperlipidemia 10/08/2012  . GERD (gastroesophageal reflux disease) 10/08/2012  . Restless leg syndrome 10/08/2012     Orientation RESPIRATION BLADDER Height & Weight     Self, Time, Situation, Place  Normal Incontinent Weight: 216 lb 14.9 oz (98.4 kg) Height:  5\' 6"  (167.6 cm)  BEHAVIORAL SYMPTOMS/MOOD NEUROLOGICAL BOWEL NUTRITION STATUS      Incontinent Diet (Clear Liquid Diet)  AMBULATORY STATUS COMMUNICATION OF NEEDS Skin   Extensive Assist Verbally Surgical wounds (Incision(Closed)07/18/17AbdomenOther(Comment) - on wound vac, though won't likely be discharged with wound vac)                       Personal Care Assistance Level of Assistance  Bathing, Dressing Bathing Assistance: Limited assistance   Dressing Assistance: Limited assistance     Functional Limitations Info             SPECIAL CARE FACTORS FREQUENCY  PT (By licensed PT), OT (By licensed OT)     PT Frequency: 5 OT Frequency: 5            Contractures      Additional Factors Info  Code Status, Allergies Code Status Info: Fullcode Allergies Info: Allergies:  Asa, Other, Vicodin           Current Medications (04/09/2016):  This is the current hospital active medication list Current Facility-Administered Medications  Medication Dose Route Frequency Provider Last Rate Last Dose  . albuterol (PROVENTIL) (2.5 MG/3ML) 0.083% nebulizer solution 2.5 mg  2.5 mg Nebulization TID Greer Pickerel, MD   2.5 mg at 04/09/16 1507  . albuterol (PROVENTIL) (2.5 MG/3ML) 0.083% nebulizer solution 2.5 mg  2.5 mg Nebulization Q4H PRN Colbert Coyer, MD   2.5 mg at 04/08/16 0540  . antiseptic  oral rinse (CPC / CETYLPYRIDINIUM CHLORIDE 0.05%) solution 7 mL  7 mL Mouth Rinse BID Autumn Messing III, MD   7 mL at 04/09/16 0932  . famotidine (PEPCID) IVPB 20 mg premix  20 mg Intravenous Q12H Autumn Messing III, MD   20 mg at 04/09/16 0931  . fentaNYL (SUBLIMAZE) injection 25 mcg  25 mcg Intravenous Q2H PRN Erick Colace, NP   25 mcg at 04/09/16 1556  . haloperidol lactate (HALDOL) injection 5 mg  5 mg Intravenous Q8H PRN Greer Pickerel, MD   5 mg at 04/09/16 1136  . heparin injection 5,000 Units  5,000 Units Subcutaneous Q8H Autumn Messing III, MD   5,000 Units at 04/09/16 1549  . hydrALAZINE (APRESOLINE) injection 10 mg  10 mg Intravenous Q4H PRN Verlee Monte, MD   10 mg at 04/09/16 1136  . hydrocortisone sodium succinate (SOLU-CORTEF) 100 MG injection 50 mg  50 mg Intravenous Q12H Verlee Monte, MD   50 mg at 04/09/16 0931  . levETIRAcetam (KEPPRA) 750 mg in sodium chloride 0.9 % 100 mL IVPB  750 mg Intravenous Q12H Corey Harold, NP   750 mg at 04/09/16 0252  . ondansetron (ZOFRAN-ODT) disintegrating tablet 4 mg  4 mg Oral Q6H PRN Autumn Messing III, MD       Or  . ondansetron [facility]) injection 4 mg  4 mg Intravenous Q6H PRN Autumn Messing III, MD      . piperacillin-tazobactam (ZOSYN) IVPB 3.375 g  3.375 g Intravenous Q8H Leann T Poindexter, RPH   3.375 g at 04/09/16 1136  . sodium chloride flush (NS) 0.9 % injection 10-40 mL  10-40 mL Intracatheter PRN Verlee Monte, MD         Discharge Medications: Please see discharge summary for a list of discharge medications.  Relevant Imaging Results:  Relevant Lab Results:   Additional Information SSN: [id number]  Standley Brooking, LCSW

## 2016-04-09 NOTE — Procedures (Signed)
Pt does not wish to wear cpap tonight and will inform RN if he changes his mind.

## 2016-04-09 NOTE — Progress Notes (Signed)
Pt was asked if he would like to try his CPAP tonight after he refused to wear it last night. Pt states that he does not wish to wear it. Pt was informed to let nurse or RT know should he change his mind. RT will continue to monitor.

## 2016-04-09 NOTE — Clinical Social Work Placement (Signed)
   CLINICAL SOCIAL WORK PLACEMENT  NOTE  Date:  04/09/2016  Patient Details  Name: [name] MRN: [id number] Date of Birth: 1931/08/01  Clinical Social Work is seeking post-discharge placement for this patient at the [place] level of care (*CSW will initial, date and re-position this form in  chart as items are completed):  Yes   Patient/family provided with [place] Work Department's list of facilities offering this level of care within the geographic area requested by the patient (or if unable, by the patient's family).  Yes   Patient/family informed of their freedom to choose among providers that offer the needed level of care, that participate in Medicare, Medicaid or managed care program needed by the patient, have an available bed and are willing to accept the patient.  Yes   Patient/family informed of [facility]'s ownership interest in [facility] and [facility], as well as of the fact that they are under no obligation to receive care at these facilities.  PASRR submitted to EDS on       PASRR number received on       Existing PASRR number confirmed on 04/09/16     FL2 transmitted to all facilities in geographic area requested by pt/family on 04/09/16     FL2 transmitted to all facilities within larger geographic area on       Patient informed that his/her managed care company has contracts with or will negotiate with certain facilities, including the following:            Patient/family informed of bed offers received.  Patient chooses bed at       Physician recommends and patient chooses bed at      Patient to be transferred to   on  .  Patient to be transferred to facility by       Patient family notified on   of transfer.  Name of family member notified:        PHYSICIAN       Additional Comment:    _______________________________________________ Standley Brooking, LCSW 04/09/2016, 4:37 PM

## 2016-04-09 NOTE — Progress Notes (Signed)
5 Days Post-Op  Subjective: Awake and alert.  No nausea. No flatus.  Restless legs better.  Objective: Vital signs in last 24 hours: Temp:  [97.6 F (36.4 C)-98.3 F (36.8 C)] 97.6 F (36.4 C) (07/22 0800) Pulse Rate:  [50-93] 54 (07/22 0800) Resp:  [15-24] 16 (07/22 0700) BP: (111-169)/(50-80) 147/59 mmHg (07/22 0800) SpO2:  [97 %-100 %] 100 % (07/22 0800) Last BM Date:  (pta)  Intake/Output from previous day: 07/21 0701 - 07/22 0700 In: 1352 [I.V.:250; IV Piggyback:1082] Out: 2255 [Urine:2200; Drains:55] Intake/Output this shift: Total I/O In: 200 [IV Piggyback:200] Out: -   PE: General- In NAD Abdomen-soft, VAC on, thin serosanguinous drain output, few bowel sounds, g-tube clamped.  Lab Results:   Recent Labs  04/08/16 0327 04/09/16 0410  WBC 20.7* 10.5  HGB 11.7* 10.5*  HCT 36.3* 33.7*  PLT 204 180   BMET  Recent Labs  04/08/16 0327 04/09/16 0410  NA 139 141  K 3.3* 2.8*  CL 109 108  CO2 23 28  GLUCOSE 97 82  BUN 20 24*  CREATININE 0.95 0.96  CALCIUM 8.2* 7.9*   PT/INR No results for input(s): LABPROT, INR in the last 72 hours. Comprehensive Metabolic Panel:    Component Value Date/Time   NA 141 04/09/2016 0410   NA 139 04/08/2016 0327   NA 139 11/25/2014 1414   K 2.8* 04/09/2016 0410   K 3.3* 04/08/2016 0327   K 3.8 11/25/2014 1414   CL 108 04/09/2016 0410   CL 109 04/08/2016 0327   CO2 28 04/09/2016 0410   CO2 23 04/08/2016 0327   CO2 23 11/25/2014 1414   BUN 24* 04/09/2016 0410   BUN 20 04/08/2016 0327   BUN 22.7 05/14/2015 0959   BUN 15.6 01/13/2015 1441   CREATININE 0.96 04/09/2016 0410   CREATININE 0.95 04/08/2016 0327   CREATININE 1.1 05/14/2015 0959   CREATININE 1.1 01/13/2015 1441   GLUCOSE 82 04/09/2016 0410   GLUCOSE 97 04/08/2016 0327   GLUCOSE 115 11/25/2014 1414   CALCIUM 7.9* 04/09/2016 0410   CALCIUM 8.2* 04/08/2016 0327   CALCIUM 9.2 11/25/2014 1414   AST 42* 04/05/2016 0226   AST 27 04/04/2016 1956   AST 16  11/25/2014 1414   ALT 37 04/05/2016 0226   ALT 26 04/04/2016 1956   ALT 10 11/25/2014 1414   ALKPHOS 64 04/05/2016 0226   ALKPHOS 96 04/04/2016 1956   ALKPHOS 56 11/25/2014 1414   BILITOT 0.7 04/05/2016 0226   BILITOT 0.6 04/04/2016 1956   BILITOT 0.24 11/25/2014 1414   PROT 4.7* 04/05/2016 0226   PROT 6.7 04/04/2016 1956   PROT 6.6 11/25/2014 1414   ALBUMIN 2.6* 04/05/2016 0226   ALBUMIN 3.8 04/04/2016 1956   ALBUMIN 3.4* 11/25/2014 1414     Studies/Results: Dg Chest Port 1 View  04/08/2016  CLINICAL DATA:  Increased wheezing and shortness of breath EXAM: PORTABLE CHEST 1 VIEW COMPARISON:  04/06/2016 FINDINGS: Chronic cardiopericardial enlargement. Stable positioning of central line with tip over the SVC. Improved lung volumes despite interval extubation. Persistent bibasilar opacity that is likely atelectasis. The left diaphragm is now visible. No effusion or pneumothorax. IMPRESSION: Improved lung volumes and decreased atelectasis after extubation. Electronically Signed   By: Monte Fantasia M.D.   On: 04/08/2016 05:52    Anti-infectives: Anti-infectives    Start     Dose/Rate Route Frequency Ordered Stop   04/05/16 0400  piperacillin-tazobactam (ZOSYN) IVPB 3.375 g     3.375 g 12.5 mL/hr  over 240 Minutes Intravenous Every 8 hours 04/05/16 0229     04/04/16 2215  piperacillin-tazobactam (ZOSYN) IVPB 3.375 g     3.375 g 100 mL/hr over 30 Minutes Intravenous  Once 04/04/16 2205 04/04/16 2242      Assessment Principal Problem:   Perforated stomach and hiatal hernia s/p repair and gastrostomy-still with some ileus Active Problems:      Anemia, iron deficiency   Meningioma, recurrent of brain ([place]) with gait instability   OSA (obstructive sleep apnea)   Acute delirium-better    LOS: 5 days   Plan: Clear liquid diet.  Transfer to floor.   [name] 04/09/2016

## 2016-04-09 NOTE — Progress Notes (Signed)
PROGRESS NOTE    [name]  [id number]  DOB: [birth year]  DOA: 04/04/2016 PCP:  Melinda Crutch, MD Outpatient Specialists:   Hospital course: History of Present Illness: [name] is an 82 y.o. male hospital history of HTN, OSA, meningioma and seizures. Patient presented to ED on 7/17 with severe sudden onset of periumbilical abdominal pain, CT showed gastric perforation a large hiatal hernia, patient underwent emergency surgery on the 17th. Intraoperatively Hypotension required 5 L of crystalloids and 2 units of packed RBCs. Was not estimated postoperatively so transferred to the ICU and PCCM was consulted. Patient was extubated on 04/06/2016. Overall he is doing much better, he is still NPO but allowed to take sips of clears. Patient was given Ativan last night for restless leg syndrome and developed confusion so [facility] hospitalist consulted for confusion and perioperative management.  Assessment & Plan:   S/p Repair of gastric perforation with omental patch and placement of gastrostomy tube Ongoing ICU care per primary team  Gen surgery following and managing.  Acute Delirium Avoiding benzodiazepines Haldol ordered as needed  Hypokalemia Replacing IV, repeat in AM Magnesium repleted and was 2.2 on 7/22  Postsurgical Atelectasis Aggressive pulmonary toilet  AKI - improved with IVF, currently back at baseline  Iron Deficiency Anemia Hg slowly trending down, now at 10.5, following Pt receives iron transfusions at cancer center  Essential Hypertension - currently managed with hydralazine IV.  Following. Pt NPO and oral meds held.   Recurrent meningioma with left-sided weakness -Had 2 surgeries and undergone radiation, per wife has resultant mild left-sided weakness and he uses walker. -PT/OT to evaluate and treat  Steroid use -Per wife he is on off dexamethasone for his meningioma and the left-sided weakness. -This time he has been on it only for 2 weeks, we'll try to  wean off of Solu-Cortef, may be back to his home dose.  Antimicrobials: Anti-infectives    Start     Dose/Rate Route Frequency Ordered Stop   04/05/16 0400  piperacillin-tazobactam (ZOSYN) IVPB 3.375 g     3.375 g 12.5 mL/hr over 240 Minutes Intravenous Every 8 hours 04/05/16 0229     04/04/16 2215  piperacillin-tazobactam (ZOSYN) IVPB 3.375 g     3.375 g 100 mL/hr over 30 Minutes Intravenous  Once 04/04/16 2205 04/04/16 2242      Subjective: Pt says he feels a little better.  The meds given last night to help with RLS helped a lot.   Objective: Filed Vitals:   04/09/16 0510 04/09/16 0520 04/09/16 0600 04/09/16 0727  BP:  156/62 158/65   Pulse: 67 53 62   Temp:      TempSrc:      Resp: 18 16 18    Height:      Weight:      SpO2: 100% 100% 100% 100%    Intake/Output Summary (Last 24 hours) at 04/09/16 0748 Last data filed at 04/09/16 0600  Gross per 24 hour  Intake   1252 ml  Output   2255 ml  Net  -1003 ml   Filed Weights   04/05/16 1006 04/06/16 0500 04/08/16 0401  Weight: 95.8 kg (211 lb 3.2 oz) 98.6 kg (217 lb 6 oz) 98.4 kg (216 lb 14.9 oz)    Exam: Eyes: PERLA, EOMI, irises appear normal, anicteric sclera,  ENMT: external ears and nose appear normal Lips appears normal, oropharynx mucosa, tongue, posterior pharynx appear normal  Neck: neck appears normal, no masses, normal ROM, no thyromegaly, no JVD  CVS: S1-S2 clear, no murmur rubs or gallops, no LE edema, normal pedal pulses  Respiratory: clear to auscultation bilaterally, no wheezing, rales or rhonchi. Respiratory effort normal. No accessory muscle use.  Abdomen: soft nontender, nondistended, normal bowel sounds, no hepatosplenomegaly, no hernias  Musculoskeletal: : no cyanosis, clubbing or edema noted bilaterally Neuro: Cranial nerves II-XII intact, strength, sensation, reflexes Psych: judgement and insight appear normal, stable mood and affect, mental status Skin: no rashes or lesions or ulcers, no  induration or nodules \  Data Reviewed: Basic Metabolic Panel:  Recent Labs Lab 04/05/16 0226 04/05/16 0905 04/06/16 0436 04/07/16 0356 04/08/16 0327 04/09/16 0410  NA 135 130* 135 138 139 141  K 3.8 4.5 4.3 3.8 3.3* 2.8*  CL 104 101 109 111 109 108  CO2 21* 20* 21* 24 23 28   GLUCOSE 163* 174* 146* 103* 97 82  BUN 22* 24* 18 17 20  24*  CREATININE 1.02 1.42* 1.21 1.04 0.95 0.96  CALCIUM 7.9* 7.6* 7.4* 7.9* 8.2* 7.9*  MG 1.6*  --   --   --   --  2.2  PHOS 4.6  --   --   --   --   --    Liver Function Tests:  Recent Labs Lab 04/04/16 1956 04/05/16 0226  AST 27 42*  ALT 26 37  ALKPHOS 96 64  BILITOT 0.6 0.7  PROT 6.7 4.7*  ALBUMIN 3.8 2.6*    Recent Labs Lab 04/04/16 1956  LIPASE 19   No results for input(s): AMMONIA in the last 168 hours. CBC:  Recent Labs Lab 04/05/16 0226 04/05/16 0905 04/06/16 0436 04/07/16 0356 04/08/16 0327 04/09/16 0410  WBC 37.0* 39.9* 33.2* 23.9* 20.7* 10.5  NEUTROABS 35.2*  --   --   --   --   --   HGB 14.0 14.6 13.0 11.3* 11.7* 10.5*  HCT 45.3 45.8 41.3 35.6* 36.3* 33.7*  MCV 90.6 87.6 88.1 89.0 87.7 89.4  PLT 279 397 298 212 204 180   Cardiac Enzymes:  Recent Labs Lab 04/05/16 0300 04/05/16 0550  TROPONINI 0.04* 0.05*   BNP (last 3 results) No results for input(s): PROBNP in the last 8760 hours. CBG:  Recent Labs Lab 04/05/16 0348  GLUCAP 165*    Recent Results (from the past 240 hour(s))  MRSA PCR Screening     Status: None   Collection Time: 04/05/16  2:53 AM  Result Value Ref Range Status   MRSA by PCR NEGATIVE NEGATIVE Final    Comment:        The GeneXpert MRSA Assay (FDA approved for NASAL specimens only), is one component of a comprehensive MRSA colonization surveillance program. It is not intended to diagnose MRSA infection nor to guide or monitor treatment for MRSA infections.      Studies: Dg Chest Port 1 View  04/08/2016  CLINICAL DATA:  Increased wheezing and shortness of breath  EXAM: PORTABLE CHEST 1 VIEW COMPARISON:  04/06/2016 FINDINGS: Chronic cardiopericardial enlargement. Stable positioning of central line with tip over the SVC. Improved lung volumes despite interval extubation. Persistent bibasilar opacity that is likely atelectasis. The left diaphragm is now visible. No effusion or pneumothorax. IMPRESSION: Improved lung volumes and decreased atelectasis after extubation. Electronically Signed   By: Monte Fantasia M.D.   On: 04/08/2016 05:52   Scheduled Meds: . albuterol  2.5 mg Nebulization TID  . antiseptic oral rinse  7 mL Mouth Rinse BID  . famotidine (PEPCID) IV  20 mg Intravenous Q12H  .  heparin  5,000 Units Subcutaneous Q8H  . hydrocortisone sod succinate (SOLU-CORTEF) inj  50 mg Intravenous Q12H  . levETIRAcetam  750 mg Intravenous Q12H  . piperacillin-tazobactam (ZOSYN)  IV  3.375 g Intravenous Q8H  . potassium chloride  10 mEq Intravenous Q1 Hr x 6   Continuous Infusions:   Principal Problem:   Perforated bowel (H[facility]) Active Problems:   Acute kidney injury ([place])   Hypertension   Gait instability   Anemia, iron deficiency   Meningioma, recurrent of brain (H[facility])   OSA (obstructive sleep apnea)   Acute delirium  Time spent:   Irwin Brakeman, MD, FAAFP [facility] Pager [phone]  If 7PM-7AM, please contact night-coverage [email or web address] Password [facility] 04/09/2016, 7:48 AM    LOS: 5 days

## 2016-04-09 NOTE — Clinical Social Work Note (Signed)
Clinical Social Work Assessment  Patient Details  Name: [name] MRN: [id number] Date of Birth: Jul 25, 1931  Date of referral:  04/09/16               Reason for consult:  Facility Placement                Permission sought to share information with:  Chartered certified accountant granted to share information::  Yes, Verbal Permission Granted  Name::        Agency::     Relationship::     Contact Information:     Housing/Transportation Living arrangements for the past 2 months:  Single Family Home Source of Information:  Patient, Spouse Patient Interpreter Needed:  None Criminal Activity/Legal Involvement Pertinent to Current Situation/Hospitalization:  No - Comment as needed Significant Relationships:  Adult Children, Spouse Lives with:  Spouse Do you feel safe going back to the place where you live?  Yes Need for family participation in patient care:  No (Coment)  Care giving concerns:  CSW reviewed PT evaluation recommending SNF.    Social Worker assessment / plan:  CSW spoke with patient's wife & family at bedside re: discharge planning. Patient had been to [facility] in the past, but would like to see what other facilities have availability - [facility] or [facility] perhaps as [facility] is closest to their home.   Employment status:  Retired Nurse, adult PT Recommendations:  [place] / Referral to community resources:  [place]  Patient/Family's Response to care:  Patient's wife feels that patient will still be in the hospital through the week, CSW explained that discharge planning occurs ahead of time to ensure a safe disposition once the doctor clears him for discharge.   Patient/Family's Understanding of and Emotional Response to Diagnosis, Current Treatment, and Prognosis:    Emotional Assessment Appearance:  Appears stated age Attitude/Demeanor/Rapport:    Affect (typically observed):     Orientation:  Oriented to Self, Oriented to Place, Oriented to  Time, Oriented to Situation Alcohol / Substance use:    Psych involvement (Current and /or in the community):     Discharge Needs  Concerns to be addressed:    Readmission within the last 30 days:    Current discharge risk:    Barriers to Discharge:      Standley Brooking, LCSW 04/09/2016, 4:27 PM

## 2016-04-10 LAB — CBC
HEMATOCRIT: 35.7 % — AB (ref 39.0–52.0)
Hemoglobin: 10.9 g/dL — ABNORMAL LOW (ref 13.0–17.0)
MCH: 27.5 pg (ref 26.0–34.0)
MCHC: 30.5 g/dL (ref 30.0–36.0)
MCV: 89.9 fL (ref 78.0–100.0)
Platelets: 176 10*3/uL (ref 150–400)
RBC: 3.97 MIL/uL — ABNORMAL LOW (ref 4.22–5.81)
RDW: 18.2 % — AB (ref 11.5–15.5)
WBC: 10.5 10*3/uL (ref 4.0–10.5)

## 2016-04-10 LAB — COMPREHENSIVE METABOLIC PANEL
ALBUMIN: 2.1 g/dL — AB (ref 3.5–5.0)
ALT: 35 U/L (ref 17–63)
ANION GAP: 7 (ref 5–15)
AST: 43 U/L — ABNORMAL HIGH (ref 15–41)
Alkaline Phosphatase: 56 U/L (ref 38–126)
BILIRUBIN TOTAL: 1.3 mg/dL — AB (ref 0.3–1.2)
BUN: 24 mg/dL — ABNORMAL HIGH (ref 6–20)
CO2: 26 mmol/L (ref 22–32)
Calcium: 7.9 mg/dL — ABNORMAL LOW (ref 8.9–10.3)
Chloride: 108 mmol/L (ref 101–111)
Creatinine, Ser: 0.89 mg/dL (ref 0.61–1.24)
GFR calc non Af Amer: >60 mL/min (ref >60)
GLUCOSE: 94 mg/dL (ref 65–99)
POTASSIUM: 3 mmol/L — AB (ref 3.5–5.1)
SODIUM: 141 mmol/L (ref 135–145)
TOTAL PROTEIN: 4.7 g/dL — AB (ref 6.5–8.1)

## 2016-04-10 LAB — PREALBUMIN: Prealbumin: 13.4 mg/dL — ABNORMAL LOW (ref 18–38)

## 2016-04-10 LAB — MAGNESIUM: Magnesium: 2.2 mg/dL (ref 1.7–2.4)

## 2016-04-10 MED ORDER — ROPINIROLE HCL 1 MG PO TABS
1.5000 mg | ORAL_TABLET | Freq: Two times a day (BID) | ORAL | Status: DC
  Administered 2016-04-10 – 2016-04-14 (×10): 1.5 mg via ORAL
  Filled 2016-04-10 (×13): qty 1

## 2016-04-10 MED ORDER — ROPINIROLE HCL 3 MG PO TABS: 1.5000 mg | ORAL_TABLET | Freq: Every day | ORAL | Status: DC

## 2016-04-10 MED ORDER — POTASSIUM CHLORIDE CRYS ER 20 MEQ PO TBCR
20.0000 meq | EXTENDED_RELEASE_TABLET | Freq: Two times a day (BID) | ORAL | Status: AC
  Administered 2016-04-10 – 2016-04-11 (×3): 20 meq via ORAL
  Filled 2016-04-10 (×3): qty 1

## 2016-04-10 MED ORDER — RESOURCE THICKENUP CLEAR PO POWD
ORAL | Status: DC | PRN
  Filled 2016-04-10 (×2): qty 125

## 2016-04-10 MED ORDER — HYDROCORTISONE NA SUCCINATE PF 100 MG IJ SOLR
50.0000 mg | Freq: Every day | INTRAMUSCULAR | Status: DC
  Administered 2016-04-10 – 2016-04-11 (×2): 50 mg via INTRAVENOUS
  Filled 2016-04-10 (×2): qty 2

## 2016-04-10 MED ORDER — STARCH (THICKENING) PO POWD
ORAL | Status: DC | PRN
  Filled 2016-04-10: qty 227

## 2016-04-10 MED ORDER — POTASSIUM CHLORIDE 10 MEQ/100ML IV SOLN
10.0000 meq | INTRAVENOUS | Status: AC
  Administered 2016-04-10 (×6): 10 meq via INTRAVENOUS
  Filled 2016-04-10 (×6): qty 100

## 2016-04-10 NOTE — Progress Notes (Signed)
PROGRESS NOTE    [name]  [id number]  DOB: 06-22-1931  DOA: 04/04/2016 PCP:  Melinda Crutch, MD Outpatient Specialists:   Hospital course: History of Present Illness: [name] is an 82 y.o. male hospital history of HTN, OSA, meningioma and seizures. Patient presented to ED on 7/17 with severe sudden onset of periumbilical abdominal pain, CT showed gastric perforation a large hiatal hernia, patient underwent emergency surgery on the 17th. Intraoperative hypotension required 5 L of crystalloids and 2 units of packed RBCs. Was not estimated postoperatively so transferred to the ICU and PCCM was consulted. Patient was extubated on 04/06/2016. Overall he is doing much better, he is still NPO but allowed to take sips of clears. Patient was given Ativan last night for restless leg syndrome and developed confusion so [facility] hospitalist consulted for confusion and perioperative management which has improved with haldol as needed.   Assessment & Plan:   S/p Repair of gastric perforation with omental patch and placement of gastrostomy tube Ongoing ICU care per primary team  Gen surgery following and managing.  Acute Delirium Avoiding benzodiazepines Haldol ordered as needed  Hypokalemia - slightly improved Ordered additional runs of KCl IVPB. Recheck in AM. Tele. Magnesium repleted and was 2.2 on 7/22  Postsurgical Atelectasis Aggressive pulmonary toilet  AKI - resolved with IVF, currently back at baseline  Iron Deficiency Anemia Hg stable at 10.6, following Pt receives iron transfusions at cancer center  Essential Hypertension - currently managed with hydralazine IV.   Recurrent meningioma with left-sided weakness -Had 2 surgeries and undergone radiation, per wife has resultant mild left-sided weakness and he uses walker. -PT/OT to evaluate and treat  Chronic steroid use -Per wife he is on/off dexamethasone for his meningioma and the left-sided weakness. -Further wean down  solucortef.  Antimicrobials: Anti-infectives    Start     Dose/Rate Route Frequency Ordered Stop   04/05/16 0400  piperacillin-tazobactam (ZOSYN) IVPB 3.375 g     3.375 g 12.5 mL/hr over 240 Minutes Intravenous Every 8 hours 04/05/16 0229     04/04/16 2215  piperacillin-tazobactam (ZOSYN) IVPB 3.375 g     3.375 g 100 mL/hr over 30 Minutes Intravenous  Once 04/04/16 2205 04/04/16 2242     Subjective: Pt seems to be tolerating clears diet.    Objective: Vitals:   04/09/16 1507 04/09/16 2040 04/09/16 2100 04/10/16 0648  BP:   (!) 146/77 (!) 153/79  Pulse:   88 88  Resp:   (!) 22 (!) 22  Temp:   97.6 F (36.4 C) 97.8 F (36.6 C)  TempSrc:   Oral Oral  SpO2: 94% 95% 97% 94%  Weight:      Height:        Intake/Output Summary (Last 24 hours) at 04/10/16 0707 Last data filed at 04/10/16 0657  Gross per 24 hour  Intake            907.5 ml  Output             1095 ml  Net           -187.5 ml   Filed Weights   04/05/16 1006 04/06/16 0500 04/08/16 0401  Weight: 95.8 kg (211 lb 3.2 oz) 98.6 kg (217 lb 6 oz) 98.4 kg (216 lb 14.9 oz)    Exam: Eyes: PERLA, EOMI, irises appear normal, anicteric sclera,  ENMT: external ears and nose appear normal Lips appears normal, oropharynx mucosa, tongue, posterior pharynx appear normal  Neck: neck appears normal,  no masses, normal ROM, no thyromegaly, no JVD  CVS: S1-S2 clear, no murmur rubs or gallops, no LE edema, normal pedal pulses  Respiratory: clear to auscultation bilaterally, no wheezing, rales or rhonchi. Respiratory effort normal. No accessory muscle use.  Abdomen: soft nontender, nondistended, normal bowel sounds, no hepatosplenomegaly, no hernias  Musculoskeletal: : no cyanosis, clubbing or edema noted bilaterally Neuro: Cranial nerves II-XII intact, strength, sensation, reflexes Psych: judgement and insight appear normal, stable mood and affect, mental status Skin: no rashes or lesions or ulcers, no induration or nodules  \  Data Reviewed: Basic Metabolic Panel:  Recent Labs Lab 04/05/16 0226  04/06/16 0436 04/07/16 0356 04/08/16 0327 04/09/16 0410 04/10/16 0402  NA 135  < > 135 138 139 141 141  K 3.8  < > 4.3 3.8 3.3* 2.8* 3.0*  CL 104  < > 109 111 109 108 108  CO2 21*  < > 21* 24 23 28 26   GLUCOSE 163*  < > 146* 103* 97 82 94  BUN 22*  < > 18 17 20  24* 24*  CREATININE 1.02  < > 1.21 1.04 0.95 0.96 0.89  CALCIUM 7.9*  < > 7.4* 7.9* 8.2* 7.9* 7.9*  MG 1.6*  --   --   --   --  2.2 2.2  PHOS 4.6  --   --   --   --   --   --   < > = values in this interval not displayed. Liver Function Tests:  Recent Labs Lab 04/04/16 1956 04/05/16 0226 04/10/16 0402  AST 27 42* 43*  ALT 26 37 35  ALKPHOS 96 64 56  BILITOT 0.6 0.7 1.3*  PROT 6.7 4.7* 4.7*  ALBUMIN 3.8 2.6* 2.1*    Recent Labs Lab 04/04/16 1956  LIPASE 19   No results for input(s): AMMONIA in the last 168 hours. CBC:  Recent Labs Lab 04/05/16 0226  04/06/16 0436 04/07/16 0356 04/08/16 0327 04/09/16 0410 04/10/16 0402  WBC 37.0*  < > 33.2* 23.9* 20.7* 10.5 10.5  NEUTROABS 35.2*  --   --   --   --   --   --   HGB 14.0  < > 13.0 11.3* 11.7* 10.5* 10.9*  HCT 45.3  < > 41.3 35.6* 36.3* 33.7* 35.7*  MCV 90.6  < > 88.1 89.0 87.7 89.4 89.9  PLT 279  < > 298 212 204 180 176  < > = values in this interval not displayed. Cardiac Enzymes:  Recent Labs Lab 04/05/16 0300 04/05/16 0550  TROPONINI 0.04* 0.05*   BNP (last 3 results) No results for input(s): PROBNP in the last 8760 hours. CBG:  Recent Labs Lab 04/05/16 0348  GLUCAP 165*    Recent Results (from the past 240 hour(s))  MRSA PCR Screening     Status: None   Collection Time: 04/05/16  2:53 AM  Result Value Ref Range Status   MRSA by PCR NEGATIVE NEGATIVE Final    Comment:        The GeneXpert MRSA Assay (FDA approved for NASAL specimens only), is one component of a comprehensive MRSA colonization surveillance program. It is not intended to diagnose  MRSA infection nor to guide or monitor treatment for MRSA infections.      Studies: No results found. Scheduled Meds: . albuterol  2.5 mg Nebulization TID  . antiseptic oral rinse  7 mL Mouth Rinse BID  . famotidine (PEPCID) IV  20 mg Intravenous Q12H  . heparin  5,000  Units Subcutaneous Q8H  . hydrocortisone sod succinate (SOLU-CORTEF) inj  50 mg Intravenous Q12H  . levETIRAcetam  750 mg Intravenous Q12H  . piperacillin-tazobactam (ZOSYN)  IV  3.375 g Intravenous Q8H  . potassium chloride  10 mEq Intravenous Q1 Hr x 6   Continuous Infusions:   Principal Problem:   Perforated bowel (HCC) Active Problems:   Acute kidney injury ([place])   Hypertension   Gait instability   Anemia, iron deficiency   Meningioma, recurrent of brain (HCC)   OSA (obstructive sleep apnea)   Acute delirium  Time spent:   Irwin Brakeman, MD, FAAFP [facility] Pager [phone]  If 7PM-7AM, please contact night-coverage [email or web address] Password [email or web address] 04/10/2016, 7:07 AM    LOS: 6 days

## 2016-04-10 NOTE — Procedures (Signed)
Pt does not wish to wear cpap tonight. Will inform RT if anything changes.

## 2016-04-10 NOTE — Care Management Important Message (Signed)
Important Message  Patient Details  Name: [name] MRN: [id number] Date of Birth: 17-Jan-1931   Medicare Important Message Given:  Yes    Apolonio Schneiders, RN 04/10/2016, 3:19 PM

## 2016-04-10 NOTE — Progress Notes (Signed)
6 Days Post-Op  Subjective: Remains awake and alert. Some concerns with swallowing of thin liquids per RN.  Had a BM. Objective: Vital signs in last 24 hours: Temp:  [97.6 F (36.4 C)-97.9 F (36.6 C)] 97.8 F (36.6 C) (07/23 0648) Pulse Rate:  [61-88] 88 (07/23 0648) Resp:  [22] 22 (07/23 0648) BP: (146-179)/(68-81) 153/79 (07/23 0648) SpO2:  [94 %-100 %] 97 % (07/23 0809) Last BM Date: 04/10/16  Intake/Output from previous day: 07/22 0701 - 07/23 0700 In: 907.5 [P.O.:120; I.V.:30; IV Piggyback:757.5] Out: 1095 [Urine:1050; Drains:45] Intake/Output this shift: No intake/output data recorded.  PE: General- In NAD Abdomen-soft, VAC on, thin serosanguinous drain output, few bowel sounds, g-tube clamped.  Lab Results:   Recent Labs  04/09/16 0410 04/10/16 0402  WBC 10.5 10.5  HGB 10.5* 10.9*  HCT 33.7* 35.7*  PLT 180 176   BMET  Recent Labs  04/09/16 0410 04/10/16 0402  NA 141 141  K 2.8* 3.0*  CL 108 108  CO2 28 26  GLUCOSE 82 94  BUN 24* 24*  CREATININE 0.96 0.89  CALCIUM 7.9* 7.9*   PT/INR No results for input(s): LABPROT, INR in the last 72 hours. Comprehensive Metabolic Panel:    Component Value Date/Time   NA 141 04/10/2016 0402   NA 141 04/09/2016 0410   NA 139 11/25/2014 1414   K 3.0 (L) 04/10/2016 0402   K 2.8 (L) 04/09/2016 0410   K 3.8 11/25/2014 1414   CL 108 04/10/2016 0402   CL 108 04/09/2016 0410   CO2 26 04/10/2016 0402   CO2 28 04/09/2016 0410   CO2 23 11/25/2014 1414   BUN 24 (H) 04/10/2016 0402   BUN 24 (H) 04/09/2016 0410   BUN 22.7 05/14/2015 0959   BUN 15.6 01/13/2015 1441   CREATININE 0.89 04/10/2016 0402   CREATININE 0.96 04/09/2016 0410   CREATININE 1.1 05/14/2015 0959   CREATININE 1.1 01/13/2015 1441   GLUCOSE 94 04/10/2016 0402   GLUCOSE 82 04/09/2016 0410   GLUCOSE 115 11/25/2014 1414   CALCIUM 7.9 (L) 04/10/2016 0402   CALCIUM 7.9 (L) 04/09/2016 0410   CALCIUM 9.2 11/25/2014 1414   AST 43 (H) 04/10/2016 0402    AST 42 (H) 04/05/2016 0226   AST 16 11/25/2014 1414   ALT 35 04/10/2016 0402   ALT 37 04/05/2016 0226   ALT 10 11/25/2014 1414   ALKPHOS 56 04/10/2016 0402   ALKPHOS 64 04/05/2016 0226   ALKPHOS 56 11/25/2014 1414   BILITOT 1.3 (H) 04/10/2016 0402   BILITOT 0.7 04/05/2016 0226   BILITOT 0.24 11/25/2014 1414   PROT 4.7 (L) 04/10/2016 0402   PROT 4.7 (L) 04/05/2016 0226   PROT 6.6 11/25/2014 1414   ALBUMIN 2.1 (L) 04/10/2016 0402   ALBUMIN 2.6 (L) 04/05/2016 0226   ALBUMIN 3.4 (L) 11/25/2014 1414     Studies/Results: No results found.  Anti-infectives: Anti-infectives    Start     Dose/Rate Route Frequency Ordered Stop   04/05/16 0400  piperacillin-tazobactam (ZOSYN) IVPB 3.375 g     3.375 g 12.5 mL/hr over 240 Minutes Intravenous Every 8 hours 04/05/16 0229     04/04/16 2215  piperacillin-tazobactam (ZOSYN) IVPB 3.375 g     3.375 g 100 mL/hr over 30 Minutes Intravenous  Once 04/04/16 2205 04/04/16 2242      Assessment Principal Problem:   Perforated stomach and hiatal hernia s/p repair and gastrostomy-ileus resolved.   Active Problems:  ? Dysphagia   Anemia, iron deficiency  Meningioma, recurrent of brain ([place]) with gait instability   OSA (obstructive sleep apnea)   Acute delirium-resolved    LOS: 6 days   Plan: SLP evaluation.  Thick liquids.  Restart Requip.   Aaidyn San Lenna Sciara 04/10/2016

## 2016-04-11 LAB — MAGNESIUM: Magnesium: 2.2 mg/dL (ref 1.7–2.4)

## 2016-04-11 LAB — COMPREHENSIVE METABOLIC PANEL
ALBUMIN: 2.2 g/dL — AB (ref 3.5–5.0)
ALT: 46 U/L (ref 17–63)
AST: 41 U/L (ref 15–41)
Alkaline Phosphatase: 57 U/L (ref 38–126)
Anion gap: 6 (ref 5–15)
BILIRUBIN TOTAL: 0.8 mg/dL (ref 0.3–1.2)
BUN: 19 mg/dL (ref 6–20)
CHLORIDE: 106 mmol/L (ref 101–111)
CO2: 26 mmol/L (ref 22–32)
CREATININE: 0.82 mg/dL (ref 0.61–1.24)
Calcium: 7.9 mg/dL — ABNORMAL LOW (ref 8.9–10.3)
GFR calc Af Amer: >60 mL/min (ref >60)
GLUCOSE: 88 mg/dL (ref 65–99)
POTASSIUM: 3.6 mmol/L (ref 3.5–5.1)
Sodium: 138 mmol/L (ref 135–145)
TOTAL PROTEIN: 5.1 g/dL — AB (ref 6.5–8.1)

## 2016-04-11 LAB — CBC
HEMATOCRIT: 39.2 % (ref 39.0–52.0)
Hemoglobin: 12.4 g/dL — ABNORMAL LOW (ref 13.0–17.0)
MCH: 28.3 pg (ref 26.0–34.0)
MCHC: 31.6 g/dL (ref 30.0–36.0)
MCV: 89.5 fL (ref 78.0–100.0)
PLATELETS: 179 10*3/uL (ref 150–400)
RBC: 4.38 MIL/uL (ref 4.22–5.81)
RDW: 19 % — AB (ref 11.5–15.5)
WBC: 14 10*3/uL — AB (ref 4.0–10.5)

## 2016-04-11 MED ORDER — ALUM & MAG HYDROXIDE-SIMETH 200-200-20 MG/5ML PO SUSP
30.0000 mL | ORAL | Status: DC | PRN
  Administered 2016-04-12 – 2016-04-15 (×5): 30 mL via ORAL
  Filled 2016-04-11 (×5): qty 30

## 2016-04-11 MED ORDER — DEXAMETHASONE 4 MG PO TABS
4.0000 mg | ORAL_TABLET | Freq: Every day | ORAL | Status: DC
  Administered 2016-04-12 – 2016-04-15 (×4): 4 mg via ORAL
  Filled 2016-04-11 (×4): qty 1

## 2016-04-11 NOTE — Progress Notes (Signed)
CONSULT PROGRESS NOTE    ANTONIE FAUSNAUGH  [id number]  DOB: 1930-12-05  DOA: 04/04/2016 PCP:  Melinda Crutch, MD Outpatient Specialists:   Hospital course: History of Present Illness: [name] is an 82 y.o. male hospital history of HTN, OSA, meningioma and seizures. Patient presented to ED on 7/17 with severe sudden onset of periumbilical abdominal pain, CT showed gastric perforation a large hiatal hernia, patient underwent emergency surgery on the 17th. Intraoperative hypotension required 5 L of crystalloids and 2 units of packed RBCs. Was not estimated postoperatively so transferred to the ICU and PCCM was consulted. Patient was extubated on 04/06/2016. Overall he is doing much better, he is still NPO but allowed to take sips of clears. Patient was given Ativan last night for restless leg syndrome and developed confusion so [facility] hospitalist consulted for confusion and perioperative management which has improved with haldol as needed.   Assessment & Plan:   S/p Repair of gastric perforation with omental patch and placement of gastrostomy tube Ongoing ICU care per primary team  Gen surgery following and managing.  Acute Delirium Avoiding benzodiazepines Haldol ordered as needed Pt back at baseline mental status  Hypokalemia - repleted.  Magnesium repleted and was 2.2 on 7/22  Postsurgical Atelectasis Aggressive pulmonary toilet  AKI - resolved with IVF, currently back at baseline  Iron Deficiency Anemia Hg stable at 10.6, following Pt receives iron transfusions at cancer center  Essential Hypertension - currently managed with hydralazine IV.   Recurrent meningioma with left-sided weakness -Had 2 surgeries and undergone radiation, per wife has resultant mild left-sided weakness and he uses walker. -PT/OT to evaluate and treat  Chronic steroid use -Per wife he is on/off dexamethasone for his meningioma and the left-sided weakness. -DC solucortef.  Resume dexamethasone 4 mg  daily.    Antimicrobials: Anti-infectives    Start     Dose/Rate Route Frequency Ordered Stop   04/05/16 0400  piperacillin-tazobactam (ZOSYN) IVPB 3.375 g     3.375 g 12.5 mL/hr over 240 Minutes Intravenous Every 8 hours 04/05/16 0229     04/04/16 2215  piperacillin-tazobactam (ZOSYN) IVPB 3.375 g     3.375 g 100 mL/hr over 30 Minutes Intravenous  Once 04/04/16 2205 04/04/16 2242     Subjective: Pt wanting to eat more.      Objective: Vitals:   04/10/16 1404 04/10/16 2008 04/10/16 2019 04/11/16 0546  BP: (!) 167/86  123/66 (!) 141/81  Pulse: 71  82 74  Resp: 20  20 20   Temp: 98.5 F (36.9 C)  97.4 F (36.3 C) 98 F (36.7 C)  TempSrc: Oral  Oral Oral  SpO2: 96% 97% 97% 96%  Weight:      Height:        Intake/Output Summary (Last 24 hours) at 04/11/16 1245 Last data filed at 04/11/16 0940  Gross per 24 hour  Intake            817.5 ml  Output             1540 ml  Net           -722.5 ml   Filed Weights   04/05/16 1006 04/06/16 0500 04/08/16 0401  Weight: 95.8 kg (211 lb 3.2 oz) 98.6 kg (217 lb 6 oz) 98.4 kg (216 lb 14.9 oz)    Exam: Eyes: PERLA, EOMI, irises appear normal, anicteric sclera,  ENMT: external ears and nose appear normal Lips appears normal, oropharynx mucosa, tongue, posterior pharynx appear normal  Neck: neck appears normal, no masses, normal ROM, no thyromegaly, no JVD  CVS: S1-S2 clear, no murmur rubs or gallops, no LE edema, normal pedal pulses  Respiratory: clear to auscultation bilaterally, no wheezing, rales or rhonchi. Respiratory effort normal. No accessory muscle use.  Abdomen: soft nontender, nondistended, normal bowel sounds, no hepatosplenomegaly, no hernias  Musculoskeletal: : no cyanosis, clubbing or edema noted bilaterally Neuro: Cranial nerves II-XII intact, strength, sensation, reflexes Psych: judgement and insight appear normal, stable mood and affect, mental status Skin: no rashes or lesions or ulcers, no induration or  nodules  Data Reviewed: Basic Metabolic Panel:  Recent Labs Lab 04/05/16 0226  04/07/16 0356 04/08/16 0327 04/09/16 0410 04/10/16 0402 04/11/16 0310  NA 135  < > 138 139 141 141 138  K 3.8  < > 3.8 3.3* 2.8* 3.0* 3.6  CL 104  < > 111 109 108 108 106  CO2 21*  < > 24 23 28 26 26   GLUCOSE 163*  < > 103* 97 82 94 88  BUN 22*  < > 17 20 24* 24* 19  CREATININE 1.02  < > 1.04 0.95 0.96 0.89 0.82  CALCIUM 7.9*  < > 7.9* 8.2* 7.9* 7.9* 7.9*  MG 1.6*  --   --   --  2.2 2.2 2.2  PHOS 4.6  --   --   --   --   --   --   < > = values in this interval not displayed. Liver Function Tests:  Recent Labs Lab 04/04/16 1956 04/05/16 0226 04/10/16 0402 04/11/16 0310  AST 27 42* 43* 41  ALT 26 37 35 46  ALKPHOS 96 64 56 57  BILITOT 0.6 0.7 1.3* 0.8  PROT 6.7 4.7* 4.7* 5.1*  ALBUMIN 3.8 2.6* 2.1* 2.2*    Recent Labs Lab 04/04/16 1956  LIPASE 19   No results for input(s): AMMONIA in the last 168 hours. CBC:  Recent Labs Lab 04/05/16 0226  04/06/16 0436 04/07/16 0356 04/08/16 0327 04/09/16 0410 04/10/16 0402  WBC 37.0*  < > 33.2* 23.9* 20.7* 10.5 10.5  NEUTROABS 35.2*  --   --   --   --   --   --   HGB 14.0  < > 13.0 11.3* 11.7* 10.5* 10.9*  HCT 45.3  < > 41.3 35.6* 36.3* 33.7* 35.7*  MCV 90.6  < > 88.1 89.0 87.7 89.4 89.9  PLT 279  < > 298 212 204 180 176  < > = values in this interval not displayed. Cardiac Enzymes:  Recent Labs Lab 04/05/16 0300 04/05/16 0550  TROPONINI 0.04* 0.05*   BNP (last 3 results) No results for input(s): PROBNP in the last 8760 hours. CBG:  Recent Labs Lab 04/05/16 0348  GLUCAP 165*    Recent Results (from the past 240 hour(s))  MRSA PCR Screening     Status: None   Collection Time: 04/05/16  2:53 AM  Result Value Ref Range Status   MRSA by PCR NEGATIVE NEGATIVE Final    Comment:        The GeneXpert MRSA Assay (FDA approved for NASAL specimens only), is one component of a comprehensive MRSA colonization surveillance  program. It is not intended to diagnose MRSA infection nor to guide or monitor treatment for MRSA infections.      Studies: No results found. Scheduled Meds: . albuterol  2.5 mg Nebulization TID  . antiseptic oral rinse  7 mL Mouth Rinse BID  . famotidine (PEPCID) IV  20 mg Intravenous Q12H  . heparin  5,000 Units Subcutaneous Q8H  . hydrocortisone sod succinate (SOLU-CORTEF) inj  50 mg Intravenous Daily  . levETIRAcetam  750 mg Intravenous Q12H  . piperacillin-tazobactam (ZOSYN)  IV  3.375 g Intravenous Q8H  . potassium chloride  20 mEq Oral BID  . rOPINIRole  1.5 mg Oral BID   Continuous Infusions:   Principal Problem:   Perforated bowel (HCC) Active Problems:   Acute kidney injury ([place])   Hypertension   Gait instability   Anemia, iron deficiency   Meningioma, recurrent of brain (HCC)   OSA (obstructive sleep apnea)   Acute delirium  Time spent:   Irwin Brakeman, MD, FAAFP [facility] Pager [phone]  If 7PM-7AM, please contact night-coverage [email or web address] Password [email or web address] 04/11/2016, 12:45 PM    LOS: 7 days

## 2016-04-11 NOTE — Progress Notes (Signed)
7 Days Post-Op  Subjective: No complaints. Wants to eat  Objective: Vital signs in last 24 hours: Temp:  [97.4 F (36.3 C)-98.5 F (36.9 C)] 98 F (36.7 C) (07/24 0546) Pulse Rate:  [71-82] 74 (07/24 0546) Resp:  [20] 20 (07/24 0546) BP: (123-167)/(66-86) 141/81 (07/24 0546) SpO2:  [96 %-97 %] 96 % (07/24 0546) Last BM Date: 04/10/16  Intake/Output from previous day: 07/23 0701 - 07/24 0700 In: 1297.5 [P.O.:240; IV Piggyback:1057.5] Out: 1540 [Urine:1500; Drains:40] Intake/Output this shift: No intake/output data recorded.  Resp: clear to auscultation bilaterally Cardio: regular rate and rhythm GI: soft, nontender. vac in place. drain output serous  Lab Results:   Recent Labs  04/09/16 0410 04/10/16 0402  WBC 10.5 10.5  HGB 10.5* 10.9*  HCT 33.7* 35.7*  PLT 180 176   BMET  Recent Labs  04/10/16 0402 04/11/16 0310  NA 141 138  K 3.0* 3.6  CL 108 106  CO2 26 26  GLUCOSE 94 88  BUN 24* 19  CREATININE 0.89 0.82  CALCIUM 7.9* 7.9*   PT/INR No results for input(s): LABPROT, INR in the last 72 hours. ABG No results for input(s): PHART, HCO3 in the last 72 hours.  Invalid input(s): PCO2, PO2  Studies/Results: No results found.  Anti-infectives: Anti-infectives    Start     Dose/Rate Route Frequency Ordered Stop   04/05/16 0400  piperacillin-tazobactam (ZOSYN) IVPB 3.375 g     3.375 g 12.5 mL/hr over 240 Minutes Intravenous Every 8 hours 04/05/16 0229     04/04/16 2215  piperacillin-tazobactam (ZOSYN) IVPB 3.375 g     3.375 g 100 mL/hr over 30 Minutes Intravenous  Once 04/04/16 2205 04/04/16 2242      Assessment/Plan: s/p Procedure(s): EXPLORATORY LAPAROTOMY, REPAIR OF GASTRIC PERFORATION WITH OMENTAL PATCH, PLACEMENT OF GASTROSTOMY TUBE (N/A) Advance diet  Consult physical therapy Ambulate Consider stopping zosyn soon Speech therapy to eval swallowing  LOS: 7 days    TOTH III,PAUL S 04/11/2016

## 2016-04-11 NOTE — Progress Notes (Signed)
Nutrition Follow-up  DOCUMENTATION CODES:   Obesity unspecified  INTERVENTION:  - Will order Magic Cup TID with meals, each supplement provides 290 kcal and 9 grams of protein. - Tech/RN to assist with meals as needed.  - RD will continue to monitor for needs.  NUTRITION DIAGNOSIS:   Swallowing difficulty related to dysphagia as evidenced by other (see comment) (SLP assessment). -updated.  GOAL:   Patient will meet greater than or equal to 90% of their needs -unmet.   MONITOR:   PO intake, Supplement acceptance, Weight trends, Labs, Skin, I & O's  ASSESSMENT:   82yo wm who presents with severe abdominal pain that started suddenly about 5pm today. It is associated with nausea and vomiting. No fever. He came to ER and CT shows large hiatal hernia and free air thought to possibly be coming from upper GI tract.  7/24 Per chart review, weight up 1 lb from 7/19-7/21 with no new weight since that time. Diet changes as follows:  7/19 @ 1023: NPO 7/22 @ [id number]: CLD 7/23 @ [id number]: FLD 7/24 @ [id number]: Regular 7/24 @ [id number]: FLD  Chart review indicate pt consumed 50% of lunch and dinner yesterday (FLD) and 50% of breakfast this AM (Regular diet). Per surgery note 7/23 at 0921: "some concerns with swallowing of thin liquids per RN." SLP saw pt earlier today and recommends thin liquids, full liquids via tsp.   Resource Thicken Up order is in place. Will order Magic Cup TID in [facility] and continue to monitor for additional nutrition-related needs and for POC/GOC.  Pt not meeting needs at this time.  Medications reviewed; 750 mg Keppra BID, PRN Zofran, 10 mEq IV KCl x6 runs ordered yesterday, 20 mEq oral KCl BID. Labs reviewed; Ca: 7.9 mg/dL.   7/21 - Pt remains NPO this AM and no new surgery note yet in place to address nutrition.  - Weight down 1 lb since 7/19. - Per rounds this AM, G-tube to be clamped and pt to be allowed sips of clears today. -  If this goes well, plan is to begin  advancing diet 7/22.  - This information was also found in Surgery MD note which was written today at 1011.  IVF: D5-NS @ 75 mL/hr (306 kcal).    7/20 - Pt was extubated 7/19 at ~1040 and estimated nutrition needs updated at this time d/t extubation.  - Pt remains NPO and per surgery note this AM, G-tube to remain to drainage.  - Per CCM note this AM, timing of nutrition to be per surgery and likely to be within the next 24 hours.  - Physical assessment shows mild generalized edema with no muscle or fat wasting.  - Pt reports good appetite PTA. He states he was at a facility for 1 month PTA and that he did not like some of the food served.  - He denies chewing or swallowing difficulties but sometimes at difficulty chewing tough meats at the facility. - Pt states he had some abdominal pain with coughing d/t hernia PTA but denies any abdominal pain/discomfort PTA with eating.   IVF: D5-1/2 NS-20 mEq KCl @ 75 mL/hr (306 kcal).   *SEE CHART FOR RD NOTES EARLIER IN ADMISSION.   Diet Order:  Diet full liquid Room service appropriate? Yes; Fluid consistency: Thin  Skin:  Wound (see comment) (Abdominal surgical incision)  Last BM:  7/23  Height:   Ht Readings from Last 1 Encounters:  04/05/16 5\' 6"  (1.676 m)  Weight:   Wt Readings from Last 1 Encounters:  04/08/16 216 lb 14.9 oz (98.4 kg)    Ideal Body Weight:  70 kg (kg)  BMI:  Body mass index is 35.01 kg/m.  Estimated Nutritional Needs:   Kcal:  1900-2100  Protein:  100-110 grams  Fluid:  2 L/day  EDUCATION NEEDS:   No education needs identified at this time    Jarome Matin, MS, RD, LDN Inpatient Clinical Dietitian Pager # [phone] After hours/weekend pager # [phone]

## 2016-04-11 NOTE — Evaluation (Signed)
Clinical/Bedside Swallow Evaluation Patient Details  Name: [name] MRN: [id number] Date of Birth: 12-09-1930  Today's Date: 04/11/2016 Time: SLP Start Time (ACUTE ONLY): 1017 SLP Stop Time (ACUTE ONLY): 1108 SLP Time Calculation (min) (ACUTE ONLY): 51 min  Past Medical History:  Past Medical History:  Diagnosis Date  . Anemia   . Aortic insufficiency 08/11/2015  . Basal cell cancer   . Brain tumor ([place])   . GERD (gastroesophageal reflux disease)   . Herniated disc    lumbar spine  . Hiatal hernia   . History of radiation therapy    back approximately 2010 prostate radiation under care of Dr. Valere Dross   . Hyperlipidemia   . Hypertension   . Meningioma ([place])    brain  . Nocturia   . Occult blood in stools   . OSA (obstructive sleep apnea) 10/28/2014   Moderate with AHI 21/hr  . Prostate cancer ([place])   . Restless leg   . Seizures ([place])    Past Surgical History:  Past Surgical History:  Procedure Laterality Date  . APPLICATION OF CRANIAL NAVIGATION N/A 02/17/2015   Procedure: APPLICATION OF CRANIAL NAVIGATION;  Surgeon: Consuella Lose, MD;  Location: [place] [facility];  Service: Neurosurgery;  Laterality: N/A;  APPLICATION OF CRANIAL NAVIGATION  . Bilateral cateract surgery    . COLONOSCOPY     colon polyp removed  . CRANIOTOMY Right 09/27/2013   Procedure: RIGHT FRONTAL CRANIOTOMY FOR TUMOR RESECTION ;  Surgeon: Consuella Lose, MD;  Location: [place] [facility];  Service: Neurosurgery;  Laterality: Right;  . CRANIOTOMY Right 02/17/2015   Procedure: Right frontal parietal craniotomy for resection of meningioma with brainlab;  Surgeon: Consuella Lose, MD;  Location: [place] [facility];  Service: Neurosurgery;  Laterality: Right;  Right frontal parietal craniotomy for resection of meningioma with brainlab  . JOINT REPLACEMENT     right knee replacement  . LAPAROTOMY N/A 04/04/2016   Procedure: EXPLORATORY LAPAROTOMY, REPAIR OF GASTRIC PERFORATION WITH OMENTAL PATCH, PLACEMENT OF  GASTROSTOMY TUBE;  Surgeon: Autumn Messing III, MD;  Location: [facility];  Service: General;  Laterality: N/A;  . PROSTATE BIOPSY    . TONSILLECTOMY     Agae 6-7  . VASECTOMY     HPI:  82 yo male adm to [facility] with abdomen pain - pt is s/p surgery with PEG placed 7/17 with intubation a few days.  Per wife, pt has been in ICU and has not had po until yesterday.   Pt started po intake yesteday and was coughing with liquids, therefore diet was changed to nectar liquids and SLP ordered.   Diet was advanced to regular/thin today.  Pt PMH + for meningioma s/p XRT and surgery, anemia, seizures.  Pt CXR shows atelectasis.     Assessment / Plan / Recommendation Clinical Impression  Pt presents with clinical indications concerning for oropharyngeal dysphagia characterized by multiple swallows and wet voice/coughing with intake.  Pt is dysarthric today - which wife reports is new.  Pt and wife admit to pt h/o coughing with solids during every meal prior to admission.   Wet voice noted during intake - concerning for laryngeal penetration.  Pt did not tolerate nectar liquids better clinically than thin.  In addition, note dyspnea with intake - after only a few boluses.  Recommend liquid via tsp with throat clears and rest prn.    Advised pt and spouse to concerns for dysphagia and aspiration mitigation strategies.  Recommend to modify diet to liquids only via tsp  to mitigate risk.  SlP to follow to determine readiness for dietary advancement and tolerance/family education.  Pt and spouse educated using teach back.      Aspiration Risk  Risk for inadequate nutrition/hydration;Moderate aspiration risk    Diet Recommendation Thin liquid (full liquids via tsp only)   Liquid Administration via: Spoon Medication Administration: Crushed with puree (whole if small) Supervision: Patient able to self feed;Full supervision/cueing for compensatory strategies Compensations: Slow rate;Small sips/bites Postural Changes: Seated  upright at 90 degrees;Remain upright for at least 30 minutes after po intake    Other  Recommendations Oral Care Recommendations: Oral care BID   Follow up Recommendations   (TBD)    Frequency and Duration min 2x/week  1 week       Prognosis Prognosis for Safe Diet Advancement: Guarded Barriers to Reach Goals: Motivation      Swallow Study   General Date of Onset: 04/11/16 HPI: 82 yo male adm to [facility] with abdomen pain - pt is s/p surgery with PEG placed 7/17 with intubation a few days.  Per wife, pt has been in ICU and has not had po until yesterday.   Pt started po intake yesteday and was coughing with liquids, therefore diet was changed to nectar liquids and SLP ordered.   Diet was advanced to regular/thin today.  Pt PMH + for meningioma s/p XRT and surgery, anemia, seizures.  Pt CXR shows atelectasis.   Type of Study: Bedside Swallow Evaluation Temperature Spikes Noted: No Respiratory Status: Room air History of Recent Intubation: No Behavior/Cognition: Alert;Cooperative;Pleasant mood Oral Cavity Assessment: Within Functional Limits Oral Care Completed by SLP: No Oral Cavity - Dentition: Adequate natural dentition Vision: Functional for self-feeding Self-Feeding Abilities: Able to feed self Patient Positioning: Upright in bed Baseline Vocal Quality: Normal Volitional Cough: Weak Volitional Swallow: Able to elicit    Oral/Motor/Sensory Function Overall Oral Motor/Sensory Function: Moderate impairment   Ice Chips Ice chips: Impaired Presentation: Spoon Pharyngeal Phase Impairments: Cough - Delayed;Multiple swallows;Wet Vocal Quality;Decreased hyoid-laryngeal movement   Thin Liquid Thin Liquid: Impaired Presentation: Self Fed;Spoon Oral Phase Impairments: Poor awareness of bolus Pharyngeal  Phase Impairments: Suspected delayed Swallow;Multiple swallows;Wet Vocal Quality;Cough - Immediate    Nectar Thick Nectar Thick Liquid: Impaired Presentation: Cup;Spoon;Self Fed Oral  Phase Impairments: Poor awareness of bolus;Reduced labial seal Pharyngeal Phase Impairments: Multiple swallows;Decreased hyoid-laryngeal movement;Throat Clearing - Delayed;Throat Clearing - Immediate;Wet Vocal Quality   Honey Thick Honey Thick Liquid: Not tested   Puree Puree: Not tested   Solid   GO   Solid: Impaired Presentation: Spoon Oral Phase Impairments: Reduced lingual movement/coordination;Poor awareness of bolus;Impaired mastication Oral Phase Functional Implications: Impaired mastication Pharyngeal Phase Impairments: Multiple swallows;Wet Vocal Quality Other Comments: pt became short of breath with minimal intake, reports no improvement with breathing since admission         Luanna Salk, [place] [facility] [place] [phone]

## 2016-04-11 NOTE — Progress Notes (Signed)
Pt continues to refuse CPAP QHS.  RT to monitor and assess as needed.  

## 2016-04-11 NOTE — Progress Notes (Signed)
Physical Therapy Treatment Patient Details Name: [name] MRN: [id number] DOB: December 15, 1930 Today's Date: 04/11/2016    History of Present Illness 82 yo male admitted 04/04/16 with abdominal pain. S/P EXPLORATORY LAPAROTOMY, REPAIR OF GASTRIC PERFORATION WITH OMENTAL PATCH, PLACEMENT OF GASTROSTOMY TUBE on 04/05/18    PT Comments    Pt tolerated increased activity today, he was able to stand and take a few steps to recliner with RW and +2 assist. He fatigues quite quickly. Performed BLE exercises with assist. Encouraged AROM of B hands to reduce edema.   Follow Up Recommendations  SNF;Supervision/Assistance - 24 hour     Equipment Recommendations  None recommended by PT    Recommendations for Other Services       Precautions / Restrictions Precautions Precautions: Fall Precaution Comments: L gastostomy, Right  J drain, VAC abdomen Restrictions Weight Bearing Restrictions: No    Mobility  Bed Mobility Overal bed mobility: Needs Assistance Bed Mobility: Sidelying to Sit;Rolling Rolling: +2 for physical assistance;Mod assist Sidelying to sit: Max assist;+2 for physical assistance     Sit to sidelying: Max assist General bed mobility comments: ASSIST WITH TRUNK AND LEGS, pt 25%  Transfers Overall transfer level: Needs assistance Equipment used: Rolling walker (2 wheeled) Transfers: Sit to/from [employer] Transfers Sit to Stand: +2 physical assistance;+2 safety/equipment;Max assist Stand pivot transfers: +2 physical assistance;+2 safety/equipment;Max assist       General transfer comment: Max A to rise, flexed trunk/knees in standing, max verbal/manual cues to stand erect (pt only able to partial stand), took several small pivotal steps with RW to recliner, fatigues quickly, increased time, difficulty weight shifting, wheezing noted, SaO2 98% on RA  Ambulation/Gait                 Stairs            Wheelchair Mobility    Modified Rankin (Stroke  Patients Only)       Balance   Sitting-balance support: Feet supported;Bilateral upper extremity supported Sitting balance-Leahy Scale: Fair Sitting balance - Comments: cannot lean forward due to abd surgery   Standing balance support: Bilateral upper extremity supported Standing balance-Leahy Scale: Poor                      Cognition Arousal/Alertness: Awake/alert Behavior During Therapy: WFL for tasks assessed/performed Overall Cognitive Status: Within Functional Limits for tasks assessed                      Exercises General Exercises - Lower Extremity Ankle Circles/Pumps: AROM;Both;10 reps;Supine Long Arc Quad: AAROM;Both;AROM;10 reps;Seated (AAROM L, AROM R) Heel Slides: AAROM;Both;10 reps;Supine Hip ABduction/ADduction: AAROM;Both;10 reps;Supine    General Comments        Pertinent Vitals/Pain Pain Score: 0-No pain    Home Living                      Prior Function            PT Goals (current goals can now be found in the care plan section) Acute Rehab PT Goals Patient Stated Goal: to get stronger PT Goal Formulation: With patient/family Time For Goal Achievement: 04/21/16 Potential to Achieve Goals: Good Progress towards PT goals: Progressing toward goals    Frequency  Min 3X/week    PT Plan Current plan remains appropriate    Co-evaluation             End of Session Equipment Utilized During Treatment:  Gait belt Activity Tolerance: Patient limited by fatigue Patient left: with call bell/phone within reach;in chair;with family/visitor present     Time: [id number] PT Time Calculation (min) (ACUTE ONLY): 37 min  Charges:  $Therapeutic Exercise: 8-22 mins $Therapeutic Activity: 8-22 mins                    G Codes:      Philomena Doheny 04/11/2016, 3:12 PM [phone]

## 2016-04-12 LAB — BASIC METABOLIC PANEL
Anion gap: 4 — ABNORMAL LOW (ref 5–15)
BUN: 15 mg/dL (ref 6–20)
CALCIUM: 7.9 mg/dL — AB (ref 8.9–10.3)
CHLORIDE: 106 mmol/L (ref 101–111)
CO2: 26 mmol/L (ref 22–32)
CREATININE: 0.79 mg/dL (ref 0.61–1.24)
GFR calc non Af Amer: >60 mL/min (ref >60)
Glucose, Bld: 92 mg/dL (ref 65–99)
Potassium: 3.8 mmol/L (ref 3.5–5.1)
Sodium: 136 mmol/L (ref 135–145)

## 2016-04-12 MED ORDER — TAMSULOSIN HCL 0.4 MG PO CAPS
0.4000 mg | ORAL_CAPSULE | Freq: Every day | ORAL | Status: DC
  Administered 2016-04-12 – 2016-04-14 (×3): 0.4 mg via ORAL
  Filled 2016-04-12 (×3): qty 1

## 2016-04-12 MED ORDER — ALBUTEROL SULFATE (2.5 MG/3ML) 0.083% IN NEBU
2.5000 mg | INHALATION_SOLUTION | Freq: Two times a day (BID) | RESPIRATORY_TRACT | Status: DC
  Administered 2016-04-13 – 2016-04-14 (×4): 2.5 mg via RESPIRATORY_TRACT
  Filled 2016-04-12 (×4): qty 3

## 2016-04-12 MED ORDER — IRBESARTAN 75 MG PO TABS
75.0000 mg | ORAL_TABLET | Freq: Every day | ORAL | Status: DC
  Administered 2016-04-12 – 2016-04-15 (×4): 75 mg via ORAL
  Filled 2016-04-12 (×4): qty 1

## 2016-04-12 NOTE — Progress Notes (Signed)
Speech Language Pathology Treatment: Dysphagia  Patient Details Name: [name] MRN: [id number] DOB: Nov 30, 1930 Today's Date: 04/12/2016 Time: [id number] SLP Time Calculation (min) (ACUTE ONLY): 26 min  Assessment / Plan / Recommendation Clinical Impression  Pt's speech is clearer today than yesterday and per spouse is near baseline.   Spouse and pt now report pt with dysphagia *coughing with EVERY MEAL for months prior to admission. Pt suspects slow progression of deficits and states his father had the same issue.  Voice continues to be intermittently gurgly at baseline = concerning for secretion aspiration - but clears with cued throat clearing/cough and dry swallows.  SLP observed pt with nectar and thin via tsp/cup and fruit via tsp.  Immediate cough post-swallow of peach - with pt stating "it went down the right side of my throat".   Better tolerance of liquids vs solids at this time but continued risk present.   Concern present given chronic dysphagia and now with pt s/p surgery with deconditioning  - nutritional support may be compromised.  Currently diet modified and compensation strategies in place to mitigate risk.    Recommend consider mbs- followed by esophagram if indicated to help in establishing mitigation strategies. Uncertain if pt can have barium s/p surgery- MD please advise.      HPI HPI: 82 yo male adm to [facility] with abdomen pain - pt is s/p surgery with PEG placed 7/17 with intubation a few days.  Per wife, pt has been in ICU and has not had po until yesterday.   Pt started po intake yesteday and was coughing with liquids, therefore diet was changed to nectar liquids and SLP ordered.   Diet was advanced to regular/thin today.  Pt PMH + for meningioma s/p XRT and surgery, anemia, seizures.  Pt CXR shows atelectasis.        SLP Plan  Continue with current plan of care     Recommendations  Diet recommendations: Thin liquid;Nectar-thick liquid (continue full liquids with  strict precautions) Liquids provided via: Teaspoon Medication Administration: Crushed with puree Supervision: Full supervision/cueing for compensatory strategies;Staff to assist with self feeding Compensations: Slow rate;Small sips/bites;Clear throat intermittently;Other (Comment) (cough and expectorate prn) Postural Changes and/or Swallow Maneuvers: Seated upright 90 degrees;Upright 30-60 min after meal             Oral Care Recommendations: Oral care BID Follow up Recommendations:  (tbd) Plan: Continue with current plan of care     [place], [place] [facility] SLP [phone]

## 2016-04-12 NOTE — Clinical Social Work Placement (Signed)
CSW received call from patient's wife stating that she has toured & accepted bed offer at [facility]. CSW has completed FL2 & will continue to follow and assist with discharge when ready.    [name], [facility] Clinical Social Worker cell #: [id number]     CLINICAL SOCIAL WORK PLACEMENT  NOTE  Date:  04/12/2016  Patient Details  Name: [name] MRN: [id number] Date of Birth: [birth year]  Clinical Social Work is seeking post-discharge placement for this patient at the [place] level of care (*CSW will initial, date and re-position this form in  chart as items are completed):  Yes   Patient/family provided with [place] Work Department's list of facilities offering this level of care within the geographic area requested by the patient (or if unable, by the patient's family).  Yes   Patient/family informed of their freedom to choose among providers that offer the needed level of care, that participate in Medicare, Medicaid or managed care program needed by the patient, have an available bed and are willing to accept the patient.  Yes   Patient/family informed of [facility]'s ownership interest in [facility] and [facility], as well as of the fact that they are under no obligation to receive care at these facilities.  PASRR submitted to EDS on       PASRR number received on       Existing PASRR number confirmed on 04/09/16     FL2 transmitted to all facilities in geographic area requested by pt/family on 04/09/16     FL2 transmitted to all facilities within larger geographic area on       Patient informed that his/her managed care company has contracts with or will negotiate with certain facilities, including the following:        Yes   Patient/family informed of bed offers received.  Patient chooses bed at [facility]     Physician recommends and patient chooses bed at      Patient to be  transferred to [facility] on  .  Patient to be transferred to facility by       Patient family notified on   of transfer.  Name of family member notified:        PHYSICIAN       Additional Comment:    _______________________________________________ Standley Brooking, LCSW 04/12/2016, 4:10 PM

## 2016-04-12 NOTE — Progress Notes (Signed)
Patient ID: [name], male   DOB: 10-May-1931, 82 y.o.   MRN: [id number]  [facility]  Subjective: POD#8 Patient in bed.  Comfortable.  No complaints.  Tolerating full liquid diet.  Wants to get up.  Objective: Vital signs in last 24 hours: Temp:  [98 F (36.7 C)-98.8 F (37.1 C)] 98.1 F (36.7 C) (07/25 0551) Pulse Rate:  [75-82] 78 (07/25 0551) Resp:  [18-20] 18 (07/25 0551) BP: (142-152)/(72-86) 152/81 (07/25 0551) SpO2:  [94 %-98 %] 97 % (07/25 0910) Last BM Date: 04/10/16  Intake/Output from previous day: 07/24 0701 - 07/25 0700 In: 987.5 [P.O.:300; IV Piggyback:627.5] Out: 2075 [Urine:2020; Drains:55] Intake/Output this shift: No intake/output data recorded.  Physical Exam: HEENT - sclerae clear, mucous membranes moist Neck - soft Chest - clear bilaterally Cor - RRR Abdomen - soft; VAC intact; JP drain with thin serous output; few BS present Neuro - alert & oriented, no focal deficits  Lab Results:   Recent Labs  04/10/16 0402 04/11/16 1330  WBC 10.5 14.0*  HGB 10.9* 12.4*  HCT 35.7* 39.2  PLT 176 179   BMET  Recent Labs  04/11/16 0310 04/12/16 0457  NA 138 136  K 3.6 3.8  CL 106 106  CO2 26 26  GLUCOSE 88 92  BUN 19 15  CREATININE 0.82 0.79  CALCIUM 7.9* 7.9*   PT/INR No results for input(s): LABPROT, INR in the last 72 hours. Comprehensive Metabolic Panel:    Component Value Date/Time   NA 136 04/12/2016 0457   NA 138 04/11/2016 0310   NA 139 11/25/2014 1414   K 3.8 04/12/2016 0457   K 3.6 04/11/2016 0310   K 3.8 11/25/2014 1414   CL 106 04/12/2016 0457   CL 106 04/11/2016 0310   CO2 26 04/12/2016 0457   CO2 26 04/11/2016 0310   CO2 23 11/25/2014 1414   BUN 15 04/12/2016 0457   BUN 19 04/11/2016 0310   BUN 22.7 05/14/2015 0959   BUN 15.6 01/13/2015 1441   CREATININE 0.79 04/12/2016 0457   CREATININE 0.82 04/11/2016 0310   CREATININE 1.1 05/14/2015 0959   CREATININE 1.1 01/13/2015 1441   GLUCOSE 92 04/12/2016 0457   GLUCOSE 88 04/11/2016 0310   GLUCOSE 115 11/25/2014 1414   CALCIUM 7.9 (L) 04/12/2016 0457   CALCIUM 7.9 (L) 04/11/2016 0310   CALCIUM 9.2 11/25/2014 1414   AST 41 04/11/2016 0310   AST 43 (H) 04/10/2016 0402   AST 16 11/25/2014 1414   ALT 46 04/11/2016 0310   ALT 35 04/10/2016 0402   ALT 10 11/25/2014 1414   ALKPHOS 57 04/11/2016 0310   ALKPHOS 56 04/10/2016 0402   ALKPHOS 56 11/25/2014 1414   BILITOT 0.8 04/11/2016 0310   BILITOT 1.3 (H) 04/10/2016 0402   BILITOT 0.24 11/25/2014 1414   PROT 5.1 (L) 04/11/2016 0310   PROT 4.7 (L) 04/10/2016 0402   PROT 6.6 11/25/2014 1414   ALBUMIN 2.2 (L) 04/11/2016 0310   ALBUMIN 2.1 (L) 04/10/2016 0402   ALBUMIN 3.4 (L) 11/25/2014 1414    Studies/Results: No results found.  Assessment & Plans: s/p Procedure(s): EXPLORATORY LAPAROTOMY, REPAIR OF GASTRIC PERFORATION WITH OMENTAL PATCH, PLACEMENT OF GASTROSTOMY TUBE (N/A)  Full liquid diet - await swallow eval and recs  Physical therapy - needs OOB, ambulation  Would discontinue Zosyn at this time from abd surgery point of view  Will follow  Disposition per medical service  Earnstine Regal, MD, FACS [facility] Office: [place] 04/12/2016

## 2016-04-12 NOTE — Progress Notes (Signed)
CONSULT PROGRESS NOTE    [name]  [id number]  DOB: 06/04/1931  DOA: 04/04/2016 PCP:  Melinda Crutch, MD Outpatient Specialists:   Hospital course: History of Present Illness: [name] is an 82 y.o. male hospital history of HTN, OSA, meningioma and seizures. Patient presented to ED on 7/17 with severe sudden onset of periumbilical abdominal pain, CT showed gastric perforation a large hiatal hernia, patient underwent emergency surgery on the 17th. Intraoperative hypotension required 5 L of crystalloids and 2 units of packed RBCs. Was not estimated postoperatively so transferred to the ICU and PCCM was consulted. Patient was extubated on 04/06/2016. Overall he is doing much better, he is still NPO but allowed to take sips of clears. Patient was given Ativan last night for restless leg syndrome and developed confusion so [facility] hospitalist consulted for confusion and perioperative management which has improved with haldol as needed.   Assessment & Plan:   S/p Repair of gastric perforation with omental patch and placement of gastrostomy tube Ongoing ICU care per primary team  Gen surgery following and managing. DC Zosyn per surgery team.   Acute Delirium Avoiding benzodiazepines Haldol ordered as needed Pt back at baseline mental status  Hypokalemia - repleted.  Magnesium repleted and was 2.2 on 7/22  Postsurgical Atelectasis Aggressive pulmonary toilet  AKI - resolved with IVF, currently back at baseline  Iron Deficiency Anemia Hg stable at 12, following Pt receives iron transfusions at cancer center  Essential Hypertension - currently managed with hydralazine IV. Resuming oral meds.   Recurrent meningioma with left-sided weakness -Had 2 surgeries and undergone radiation, per wife has resultant mild left-sided weakness and he uses walker. -PT/OT recommending SNF.  Consult Education officer, museum.    Chronic steroid use -Per wife he is on/off dexamethasone for his meningioma and  the left-sided weakness. -DC solucortef.  Resume dexamethasone 4 mg daily.    Antimicrobials: Anti-infectives    Start     Dose/Rate Route Frequency Ordered Stop   04/05/16 0400  piperacillin-tazobactam (ZOSYN) IVPB 3.375 g  Status:  Discontinued     3.375 g 12.5 mL/hr over 240 Minutes Intravenous Every 8 hours 04/05/16 0229 04/12/16 1459   04/04/16 2215  piperacillin-tazobactam (ZOSYN) IVPB 3.375 g     3.375 g 100 mL/hr over 30 Minutes Intravenous  Once 04/04/16 2205 04/04/16 2242     Subjective: Pt says he has been doing well on liquid diet.  RLS better with meds.   Objective: Vitals:   04/12/16 0551 04/12/16 0910 04/12/16 1312 04/12/16 1414  BP: (!) 152/81  (!) 155/76   Pulse: 78  78   Resp: 18  18   Temp: 98.1 F (36.7 C)  98.7 F (37.1 C)   TempSrc: Oral  Oral   SpO2: 97% 97% 97% 95%  Weight:      Height:        Intake/Output Summary (Last 24 hours) at 04/12/16 1501 Last data filed at 04/12/16 0634  Gross per 24 hour  Intake            587.5 ml  Output             1275 ml  Net           -687.5 ml   Filed Weights   04/05/16 1006 04/06/16 0500 04/08/16 0401  Weight: 95.8 kg (211 lb 3.2 oz) 98.6 kg (217 lb 6 oz) 98.4 kg (216 lb 14.9 oz)    Exam: Eyes: PERLA, EOMI, irises appear normal,  anicteric sclera,  ENMT: external ears and nose appear normal Lips appears normal, oropharynx mucosa, tongue, posterior pharynx appear normal  Neck: neck appears normal, no masses, normal ROM, no thyromegaly, no JVD  CVS: S1-S2 clear, no murmur rubs or gallops, no LE edema, normal pedal pulses  Respiratory: clear to auscultation bilaterally, no wheezing, rales or rhonchi. Respiratory effort normal. No accessory muscle use.  Abdomen: soft nontender, nondistended, normal bowel sounds, no hepatosplenomegaly, no hernias  Musculoskeletal: : no cyanosis, clubbing or edema noted bilaterally Neuro: Cranial nerves II-XII intact, strength, sensation, reflexes Psych: judgement and  insight appear normal, stable mood and affect, mental status Skin: no rashes or lesions or ulcers, no induration or nodules  Data Reviewed: Basic Metabolic Panel:  Recent Labs Lab 04/08/16 0327 04/09/16 0410 04/10/16 0402 04/11/16 0310 04/12/16 0457  NA 139 141 141 138 136  K 3.3* 2.8* 3.0* 3.6 3.8  CL 109 108 108 106 106  CO2 23 28 26 26 26   GLUCOSE 97 82 94 88 92  BUN 20 24* 24* 19 15  CREATININE 0.95 0.96 0.89 0.82 0.79  CALCIUM 8.2* 7.9* 7.9* 7.9* 7.9*  MG  --  2.2 2.2 2.2  --    Liver Function Tests:  Recent Labs Lab 04/10/16 0402 04/11/16 0310  AST 43* 41  ALT 35 46  ALKPHOS 56 57  BILITOT 1.3* 0.8  PROT 4.7* 5.1*  ALBUMIN 2.1* 2.2*   No results for input(s): LIPASE, AMYLASE in the last 168 hours. No results for input(s): AMMONIA in the last 168 hours. CBC:  Recent Labs Lab 04/07/16 0356 04/08/16 0327 04/09/16 0410 04/10/16 0402 04/11/16 1330  WBC 23.9* 20.7* 10.5 10.5 14.0*  HGB 11.3* 11.7* 10.5* 10.9* 12.4*  HCT 35.6* 36.3* 33.7* 35.7* 39.2  MCV 89.0 87.7 89.4 89.9 89.5  PLT 212 204 180 176 179   Cardiac Enzymes: No results for input(s): CKTOTAL, CKMB, CKMBINDEX, TROPONINI in the last 168 hours. BNP (last 3 results) No results for input(s): PROBNP in the last 8760 hours. CBG: No results for input(s): GLUCAP in the last 168 hours.  Recent Results (from the past 240 hour(s))  MRSA PCR Screening     Status: None   Collection Time: 04/05/16  2:53 AM  Result Value Ref Range Status   MRSA by PCR NEGATIVE NEGATIVE Final    Comment:        The GeneXpert MRSA Assay (FDA approved for NASAL specimens only), is one component of a comprehensive MRSA colonization surveillance program. It is not intended to diagnose MRSA infection nor to guide or monitor treatment for MRSA infections.      Studies: No results found. Scheduled Meds: . albuterol  2.5 mg Nebulization TID  . antiseptic oral rinse  7 mL Mouth Rinse BID  . dexamethasone  4 mg Oral  Daily  . famotidine (PEPCID) IV  20 mg Intravenous Q12H  . heparin  5,000 Units Subcutaneous Q8H  . levETIRAcetam  750 mg Intravenous Q12H  . rOPINIRole  1.5 mg Oral BID   Continuous Infusions:   Principal Problem:   Perforated bowel (HCC) Active Problems:   Acute kidney injury ([place])   Hypertension   Gait instability   Anemia, iron deficiency   Meningioma, recurrent of brain (HCC)   OSA (obstructive sleep apnea)   Acute delirium  Time spent:   Irwin Brakeman, MD, FAAFP [facility] Pager [phone]  If 7PM-7AM, please contact night-coverage [email or web address] Password [email or web address] 04/12/2016, 3:01 PM    LOS: 8  days

## 2016-04-12 NOTE — Clinical Social Work Placement (Signed)
CSW met with patient & wife at bedside, provided SNF bed offers. Patient's wife plans to tour [facility] and will contact CSW once SNF decision has been made.    , LCSW [facility] Clinical Social Worker cell #: [phone]   CLINICAL SOCIAL WORK PLACEMENT  NOTE  Date:  04/12/2016  Patient Details  Name: [name] MRN: [id number] Date of Birth: 04/01/1931  Clinical Social Work is seeking post-discharge placement for this patient at the Skilled  Nursing Facility level of care (*CSW will initial, date and re-position this form in  chart as items are completed):  Yes   Patient/family provided with [place] Clinical Social Work Department's list of facilities offering this level of care within the geographic area requested by the patient (or if unable, by the patient's family).  Yes   Patient/family informed of their freedom to choose among providers that offer the needed level of care, that participate in Medicare, Medicaid or managed care program needed by the patient, have an available bed and are willing to accept the patient.  Yes   Patient/family informed of [place]'s ownership interest in [facility] and [facility], as well as of the fact that they are under no obligation to receive care at these facilities.  PASRR submitted to EDS on       PASRR number received on       Existing PASRR number confirmed on 04/09/16     FL2 transmitted to all facilities in geographic area requested by pt/family on 04/09/16     FL2 transmitted to all facilities within larger geographic area on       Patient informed that his/her managed care company has contracts with or will negotiate with certain facilities, including the following:        Yes   Patient/family informed of bed offers received.  Patient chooses bed at       Physician recommends and patient chooses bed at      Patient to be transferred to   on   .  Patient to be transferred to facility by       Patient family notified on   of transfer.  Name of family member notified:        PHYSICIAN       Additional Comment:    _______________________________________________ ,  F, LCSW 04/12/2016, 1:32 PM  

## 2016-04-12 NOTE — Progress Notes (Signed)
Pt continues to refuse nocturnal cpap.  Machine not found in room.  Pt was encouraged to call should he change his mind.

## 2016-04-13 ENCOUNTER — Inpatient Hospital Stay (HOSPITAL_COMMUNITY): Payer: Medicare Other

## 2016-04-13 MED ORDER — FAMOTIDINE 20 MG PO TABS
20.0000 mg | ORAL_TABLET | Freq: Two times a day (BID) | ORAL | Status: DC
  Administered 2016-04-13 – 2016-04-15 (×4): 20 mg via ORAL
  Filled 2016-04-13 (×4): qty 1

## 2016-04-13 MED ORDER — LAMOTRIGINE 100 MG PO TABS
100.0000 mg | ORAL_TABLET | Freq: Two times a day (BID) | ORAL | Status: DC
  Administered 2016-04-13 – 2016-04-15 (×5): 100 mg via ORAL
  Filled 2016-04-13 (×5): qty 1

## 2016-04-13 MED ORDER — SODIUM CHLORIDE 0.9 % IV SOLN
750.0000 mg | Freq: Two times a day (BID) | INTRAVENOUS | Status: DC
  Administered 2016-04-13 – 2016-04-14 (×3): 750 mg via INTRAVENOUS
  Filled 2016-04-13 (×4): qty 7.5

## 2016-04-13 NOTE — Progress Notes (Signed)
SlP received call from Dr Eliseo Squires to proceed with MBS.  Thanks for calling and checking to assure testing appropriate.  Will plan MBS this pm - RN informed and requested to inform pt and family.  Luanna Salk, [place] [facility] SLP [phone]

## 2016-04-13 NOTE — Progress Notes (Signed)
Patient ID: [name], male   DOB: 12-27-1930, 82 y.o.   MRN: [id number]  [facility]  Subjective: Patient in bed, son at bedside.  Comfortable.  Looking forward to discharge to SNF soon.  No complaints.  Objective: Vital signs in last 24 hours: Temp:  [98.2 F (36.8 C)-98.5 F (36.9 C)] 98.2 F (36.8 C) (07/26 0539) Pulse Rate:  [79-87] 87 (07/26 0816) Resp:  [18-20] 18 (07/26 0816) BP: (166)/(78-82) 166/78 (07/26 0539) SpO2:  [94 %-97 %] 94 % (07/26 0816) Last BM Date: 04/10/16  Intake/Output from previous day: 07/25 0701 - 07/26 0700 In: 157.5 [IV Piggyback:157.5] Out: 1750 [Urine:1750] Intake/Output this shift: No intake/output data recorded.  Physical Exam: HEENT - sclerae clear, mucous membranes moist Neck - soft; CVP line on right Chest - clear bilaterally Cor - RRR Abdomen - soft, obese; BS present; VAC dressing intact; JP with thin serosang  Lab Results:   Recent Labs  04/11/16 1330  WBC 14.0*  HGB 12.4*  HCT 39.2  PLT 179   BMET  Recent Labs  04/11/16 0310 04/12/16 0457  NA 138 136  K 3.6 3.8  CL 106 106  CO2 26 26  GLUCOSE 88 92  BUN 19 15  CREATININE 0.82 0.79  CALCIUM 7.9* 7.9*   PT/INR No results for input(s): LABPROT, INR in the last 72 hours. Comprehensive Metabolic Panel:    Component Value Date/Time   NA 136 04/12/2016 0457   NA 138 04/11/2016 0310   NA 139 11/25/2014 1414   K 3.8 04/12/2016 0457   K 3.6 04/11/2016 0310   K 3.8 11/25/2014 1414   CL 106 04/12/2016 0457   CL 106 04/11/2016 0310   CO2 26 04/12/2016 0457   CO2 26 04/11/2016 0310   CO2 23 11/25/2014 1414   BUN 15 04/12/2016 0457   BUN 19 04/11/2016 0310   BUN 22.7 05/14/2015 0959   BUN 15.6 01/13/2015 1441   CREATININE 0.79 04/12/2016 0457   CREATININE 0.82 04/11/2016 0310   CREATININE 1.1 05/14/2015 0959   CREATININE 1.1 01/13/2015 1441   GLUCOSE 92 04/12/2016 0457   GLUCOSE 88 04/11/2016 0310   GLUCOSE 115  11/25/2014 1414   CALCIUM 7.9 (L) 04/12/2016 0457   CALCIUM 7.9 (L) 04/11/2016 0310   CALCIUM 9.2 11/25/2014 1414   AST 41 04/11/2016 0310   AST 43 (H) 04/10/2016 0402   AST 16 11/25/2014 1414   ALT 46 04/11/2016 0310   ALT 35 04/10/2016 0402   ALT 10 11/25/2014 1414   ALKPHOS 57 04/11/2016 0310   ALKPHOS 56 04/10/2016 0402   ALKPHOS 56 11/25/2014 1414   BILITOT 0.8 04/11/2016 0310   BILITOT 1.3 (H) 04/10/2016 0402   BILITOT 0.24 11/25/2014 1414   PROT 5.1 (L) 04/11/2016 0310   PROT 4.7 (L) 04/10/2016 0402   PROT 6.6 11/25/2014 1414   ALBUMIN 2.2 (L) 04/11/2016 0310   ALBUMIN 2.1 (L) 04/10/2016 0402   ALBUMIN 3.4 (L) 11/25/2014 1414    Studies/Results: No results found.  Assessment & Plans: s/p Procedure(s): EXPLORATORY LAPAROTOMY, REPAIR OF GASTRIC PERFORATION WITH OMENTAL PATCH, PLACEMENT OF GASTROSTOMY TUBE (N/A)                       Full liquid diet - await swallow eval and recs - barium study pending  Physical therapy - needs OOB, ambulation                       Zosyn dicontinued                       Education officer, museum coordinating with family for discharge plans            Will remove JP drain tomorrow  Earnstine Regal, MD, [facility] Office: [place] 04/13/2016

## 2016-04-13 NOTE — Care Management Note (Signed)
Case Management Note  Patient Details  Name: [name] MRN: [id number] Date of Birth: Mar 28, 1931  Subjective/Objective: Hx: meningiomas, & seizures. POD#9 Exp lap perforated bowel w/placement of GT. MBS today,drain,iv pepcid,iv keppra,Hospitalist signed off today. Gen sx following. D/c plan SNF. CSW following.                  Action/Plan:d/c plan SNF.   Expected Discharge Date:   (UNKNOWN)               Expected Discharge Plan:  Skilled Nursing Facility  In-House Referral:     Discharge planning Services  CM Consult  Post Acute Care Choice:    Choice offered to:     DME Arranged:    DME Agency:     HH Arranged:    [place] Agency:     Status of Service:  In process, will continue to follow  If discussed at Long Length of Stay Meetings, dates discussed:    Additional Comments:  Dessa Phi, RN 04/13/2016, 12:50 PM

## 2016-04-13 NOTE — Progress Notes (Signed)
Pt continues to refuse CPAP QHS.  RT to monitor and assess as needed.  

## 2016-04-13 NOTE — Progress Notes (Signed)
Physical Therapy Treatment Patient Details Name: [name] MRN: [id number] DOB: 07/23/1931 Today's Date: 04/13/2016    History of Present Illness 82 yo male admitted 04/04/16 with abdominal pain. S/P EXPLORATORY LAPAROTOMY, REPAIR OF GASTRIC PERFORATION WITH OMENTAL PATCH, PLACEMENT OF GASTROSTOMY TUBE on 04/05/18    PT Comments    Assisted OOB to amb a limited distance + 2 assist and EVA walker.    Follow Up Recommendations  SNF     Equipment Recommendations  None recommended by PT    Recommendations for Other Services       Precautions / Restrictions Precautions Precaution Comments: L gastostomy, Right  J drain, VAC abdomen Restrictions Weight Bearing Restrictions: No    Mobility  Bed Mobility Overal bed mobility: Needs Assistance Bed Mobility: Rolling;Sidelying to Sit Rolling: Mod assist;Max assist Sidelying to sit: Mod assist;Max assist;+2 for physical assistance       General bed mobility comments: increased time and 25% VC's on proper tech to use bed rail and roll to side lying due to ABD discomfort  Transfers Overall transfer level: Needs assistance Equipment used: 2 person hand held assist;Bilateral platform walker Transfers: Sit to/from Stand Sit to Stand: +2 physical assistance;+2 safety/equipment;Max assist         General transfer comment: Max Assist to rise from elevated bed.  Used B platform EVA walker for increased support.    Ambulation/Gait Ambulation/Gait assistance: Max assist;+2 physical assistance;+2 safety/equipment Ambulation Distance (Feet): 5 Feet Assistive device: Bilateral platform walker (EVA walker) Gait Pattern/deviations: Step-to pattern;Decreased stride length     General Gait Details: Used B platform EVA walker for increased support.  Limited amb distance due to B LE weakness L>R.  Recliner following closely behind.     Stairs            Wheelchair Mobility    Modified Rankin (Stroke Patients Only)        Balance                                    Cognition Arousal/Alertness: Awake/alert Behavior During Therapy: WFL for tasks assessed/performed Overall Cognitive Status: Within Functional Limits for tasks assessed                      Exercises      General Comments        Pertinent Vitals/Pain Pain Assessment: Faces Faces Pain Scale: Hurts little more Pain Location: ABD with activity Pain Descriptors / Indicators: Discomfort;Grimacing Pain Intervention(s): Monitored during session;Repositioned    Home Living                      Prior Function            PT Goals (current goals can now be found in the care plan section) Progress towards PT goals: Progressing toward goals    Frequency  Min 3X/week    PT Plan Current plan remains appropriate    Co-evaluation             End of Session Equipment Utilized During Treatment: Gait belt Activity Tolerance: Patient limited by fatigue Patient left: with call bell/phone within reach;in chair     Time: 0940-1010 PT Time Calculation (min) (ACUTE ONLY): 30 min  Charges:  $Gait Training: 8-22 mins $Therapeutic Activity: 8-22 mins  G Codes:      Rica Koyanagi  PTA [facility]  Acute  Rehab Pager      [phone]

## 2016-04-13 NOTE — Consult Note (Signed)
SLP Modified Barium Swallow:  Patient Details Name: [name] MRN: [id number] DOB: 1931-04-07  MBS completed in radiology. Recommend dys 3 diet with chopped meats, nectar thick liquids, meds one at a time in puree. Pt must adhere to posted swallow precautions due to aspiration risk. Full report under chart review/imaging.  Lannie Heaps B. [place], [facility], [place]          Shonna Chock 04/13/2016, 3:25 PM

## 2016-04-13 NOTE — Progress Notes (Signed)
CONSULT PROGRESS NOTE   Will sign off.  Please call if needed.  [name]  [id number]  DOB: 1931-01-21  DOA: 04/04/2016 PCP:  Melinda Crutch, MD Outpatient Specialists:   Hospital course: History of Present Illness: [name] is an 82 y.o. male hospital history of HTN, OSA, meningioma and seizures. Patient presented to ED on 7/17 with severe sudden onset of periumbilical abdominal pain, CT showed gastric perforation a large hiatal hernia, patient underwent emergency surgery on the 17th. Intraoperative hypotension required 5 L of crystalloids and 2 units of packed RBCs. Was not estimated postoperatively so transferred to the ICU and PCCM was consulted. Patient was extubated on 04/06/2016. Overall he is doing much better, he is still NPO but allowed to take sips of clears. Patient was given Ativan last night for restless leg syndrome and developed confusion so [facility] hospitalist consulted for confusion and perioperative management which has improved with haldol as needed.   Assessment & Plan:  dysphagia -has been an on-going issue outpatient -for MBS- ok to get barium per Dr. Harlow Asa -SLP following  S/p Repair of gastric perforation with omental patch and placement of gastrostomy tube Gen surgery managing (promary) DC Zosyn per surgery team.   Acute Delirium Avoiding benzodiazepines Resume lamictal Pt back at baseline mental status  Hypokalemia - repleted.   Postsurgical Atelectasis Aggressive pulmonary toilet  AKI - resolved with IVF, currently back at baseline  Iron Deficiency Anemia Hg stable at 12, following Pt receives iron transfusions at cancer center  Essential Hypertension - Resume oral meds.   Recurrent meningioma with left-sided weakness -Had 2 surgeries and undergone radiation, per wife has resultant mild left-sided weakness and he uses walker. -has bed at SNF  Chronic steroid use -Per wife he is on/off dexamethasone for his meningioma and the left-sided  weakness. -DC solucortef.  Resume dexamethasone 4 mg daily.      Will sign off- call if needed   Antimicrobials: Anti-infectives    Start     Dose/Rate Route Frequency Ordered Stop   04/05/16 0400  piperacillin-tazobactam (ZOSYN) IVPB 3.375 g  Status:  Discontinued     3.375 g 12.5 mL/hr over 240 Minutes Intravenous Every 8 hours 04/05/16 0229 04/12/16 1459   04/04/16 2215  piperacillin-tazobactam (ZOSYN) IVPB 3.375 g     3.375 g 100 mL/hr over 30 Minutes Intravenous  Once 04/04/16 2205 04/04/16 2242       Subjective: No complaints - ready for rehab  Objective: Vitals:   04/12/16 1950 04/12/16 2116 04/13/16 0539 04/13/16 0816  BP:  (!) 166/82 (!) 166/78   Pulse:  80 79 87  Resp:  20 18 18   Temp:  98.5 F (36.9 C) 98.2 F (36.8 C)   TempSrc:  Oral Oral   SpO2: 97% 97% 97% 94%  Weight:      Height:        Intake/Output Summary (Last 24 hours) at 04/13/16 1127 Last data filed at 04/13/16 0540  Gross per 24 hour  Intake            157.5 ml  Output             1750 ml  Net          -1592.5 ml   Filed Weights   04/05/16 1006 04/06/16 0500 04/08/16 0401  Weight: 95.8 kg (211 lb 3.2 oz) 98.6 kg (217 lb 6 oz) 98.4 kg (216 lb 14.9 oz)    Exam: alert CVS: S1-S2 clear, no murmur  rubs or gallops, no LE edema, normal pedal pulses  Respiratory: clear to auscultation bilaterally, no wheezing, rales or rhonchi. Respiratory effort normal. No accessory muscle use.  Abdomen: soft nontender, nondistended, normal bowel sounds, no hepatosplenomegaly, no hernias  Musculoskeletal: : no cyanosis, clubbing or edema noted bilaterally   Data Reviewed: Basic Metabolic Panel:  Recent Labs Lab 04/08/16 0327 04/09/16 0410 04/10/16 0402 04/11/16 0310 04/12/16 0457  NA 139 141 141 138 136  K 3.3* 2.8* 3.0* 3.6 3.8  CL 109 108 108 106 106  CO2 23 28 26 26 26   GLUCOSE 97 82 94 88 92  BUN 20 24* 24* 19 15  CREATININE 0.95 0.96 0.89 0.82 0.79  CALCIUM 8.2* 7.9* 7.9* 7.9* 7.9*   MG  --  2.2 2.2 2.2  --    Liver Function Tests:  Recent Labs Lab 04/10/16 0402 04/11/16 0310  AST 43* 41  ALT 35 46  ALKPHOS 56 57  BILITOT 1.3* 0.8  PROT 4.7* 5.1*  ALBUMIN 2.1* 2.2*   No results for input(s): LIPASE, AMYLASE in the last 168 hours. No results for input(s): AMMONIA in the last 168 hours. CBC:  Recent Labs Lab 04/07/16 0356 04/08/16 0327 04/09/16 0410 04/10/16 0402 04/11/16 1330  WBC 23.9* 20.7* 10.5 10.5 14.0*  HGB 11.3* 11.7* 10.5* 10.9* 12.4*  HCT 35.6* 36.3* 33.7* 35.7* 39.2  MCV 89.0 87.7 89.4 89.9 89.5  PLT 212 204 180 176 179   Cardiac Enzymes: No results for input(s): CKTOTAL, CKMB, CKMBINDEX, TROPONINI in the last 168 hours. BNP (last 3 results) No results for input(s): PROBNP in the last 8760 hours. CBG: No results for input(s): GLUCAP in the last 168 hours.  Recent Results (from the past 240 hour(s))  MRSA PCR Screening     Status: None   Collection Time: 04/05/16  2:53 AM  Result Value Ref Range Status   MRSA by PCR NEGATIVE NEGATIVE Final    Comment:        The GeneXpert MRSA Assay (FDA approved for NASAL specimens only), is one component of a comprehensive MRSA colonization surveillance program. It is not intended to diagnose MRSA infection nor to guide or monitor treatment for MRSA infections.      Studies: No results found. Scheduled Meds: . albuterol  2.5 mg Nebulization BID  . antiseptic oral rinse  7 mL Mouth Rinse BID  . dexamethasone  4 mg Oral Daily  . famotidine (PEPCID) IV  20 mg Intravenous Q12H  . heparin  5,000 Units Subcutaneous Q8H  . irbesartan  75 mg Oral Daily  . lamoTRIgine  100 mg Oral BID  . levETIRAcetam  750 mg Intravenous Q12H  . rOPINIRole  1.5 mg Oral BID  . tamsulosin  0.4 mg Oral QPC supper   Continuous Infusions:   Principal Problem:   Perforated bowel (HCC) Active Problems:   Acute kidney injury ([place])   Hypertension   Gait instability   Anemia, iron deficiency   Meningioma,  recurrent of brain (HCC)   OSA (obstructive sleep apnea)   Acute delirium  Time spent:   Geradine Girt, DO [facility] Pager [phone]  If 7PM-7AM, please contact night-coverage [email or web address] Password [email or web address] 04/13/2016, 11:27 AM    LOS: 9 days

## 2016-04-14 NOTE — Progress Notes (Signed)
The patient was seen this evening by Dr. Tammi Klippel and myself. His MRI of the brain was presented in our multidisciplinary brain conference on 04/13/16. Dr. Kathyrn Sheriff did not feel that he had additional surgical options to offer the patient, nor did Dr. Tammi Klippel feel that radiotherapy options were available. Given the complicated course the patient has had, recommendations were made for a slow taper of steroids if possible. He has been dependant intermittently with steroids to manage the neurologic symptoms he has experienced and the patient understands that this has been a risk factor given his most recent gastric perforation. The group in conference recommended palliative evaluation,land a slow steroid taper after the patient has been on dexamethason 4mg  daily for one week. Thereafter recommendations were to decrease the dose by half for a week at a time, and once the patient tapers to 0.5 mg of dexamethasone daily for 1 week, this could be discontinued. We will be happy to see the patient moving forward as needed.  Carola Rhine, [facility] Seen with and dictated for Sheral Apley. Tammi Klippel, MD

## 2016-04-14 NOTE — Progress Notes (Signed)
Speech Language Pathology Treatment: Dysphagia  Patient Details Name: [name] MRN: [id number] DOB: 1931/07/31 Today's Date: 04/14/2016 Time: 1015-1030 SLP Time Calculation (min) (ACUTE ONLY): 15 min  Assessment / Plan / Recommendation Clinical Impression  Pt verbalized understanding of need for nectar thick liquids but needed min verbal cues to follow compensatory strategies. SLP demonstrated thickening and provided explanation of rationale and predicted plan of care. Pt and wife in agreement. Pt demonstrated tolerance of nectar thick liquids with use of strategies over 4 oz of intake with cough on initial sip only. SLP will f/u for diet tolerance and potential advancement.    HPI HPI: 82 yo male adm to [facility] with abdominal pain - pt is s/p surgery with PEG placed 7/17 with intubation a few days.  Per wife, pt has been in ICU and has not had po until yesterday.   Pt started po intake yesteday and was coughing with liquids, therefore diet was changed to nectar liquids and SLP ordered.   Diet was advanced to regular/thin today.  Pt PMH + for meningioma s/p XRT and surgery, anemia, seizures.  Pt CXR shows atelectasis.        SLP Plan  Continue with current plan of care     Recommendations  Diet recommendations: Dysphagia 3 (mechanical soft);Nectar-thick liquid Liquids provided via: Cup Medication Administration: Crushed with puree Supervision: Full supervision/cueing for compensatory strategies;Staff to assist with self feeding Compensations: Slow rate;Small sips/bites;Clear throat intermittently;Other (Comment);Minimize environmental distractions;Multiple dry swallows after each bite/sip Postural Changes and/or Swallow Maneuvers: Seated upright 90 degrees;Upright 30-60 min after meal             Oral Care Recommendations: Oral care BID Follow up Recommendations: Skilled Nursing facility Plan: Continue with current plan of care     [place] Lourene Hoston, MA CCC-SLP  [id number]  Lynann Beaver 04/14/2016, 10:31 AM

## 2016-04-14 NOTE — Progress Notes (Signed)
Patient continues to decline the use of nocturnal CPAP/BiPAP.

## 2016-04-14 NOTE — Progress Notes (Signed)
Patient ID: [name], male   DOB: 07/04/1931, 81 y.o.   MRN: [id number]   [facility]  Subjective: Patient in bed, wife at bedside.  Comfortable.  No complaints.  Wants to go to rehab.  Objective: Vital signs in last 24 hours: Temp:  [98 F (36.7 C)-98.2 F (36.8 C)] 98.2 F (36.8 C) (07/27 1258) Pulse Rate:  [72-79] 78 (07/27 1258) Resp:  [18-19] 19 (07/27 1258) BP: (124-139)/(63-79) 135/79 (07/27 1258) SpO2:  [94 %-98 %] 97 % (07/27 1258) Last BM Date: 04/10/16  Intake/Output from previous day: 07/26 0701 - 07/27 0700 In: 107.5 [IV Piggyback:107.5] Out: 1305 [Urine:1250; Drains:55] Intake/Output this shift: Total I/O In: 120 [P.O.:120] Out: 400 [Urine:400]  Physical Exam: HEENT - sclerae clear, mucous membranes moist Neck - soft Chest - clear bilaterally Cor - RRR Abdomen - soft; JP drain with thin serous removed and dressed; VAC intact Neuro - alert & oriented, no focal deficits  Lab Results:   Recent Labs  04/11/16 1330  WBC 14.0*  HGB 12.4*  HCT 39.2  PLT 179   BMET  Recent Labs  04/12/16 0457  NA 136  K 3.8  CL 106  CO2 26  GLUCOSE 92  BUN 15  CREATININE 0.79  CALCIUM 7.9*   PT/INR No results for input(s): LABPROT, INR in the last 72 hours. Comprehensive Metabolic Panel:    Component Value Date/Time   NA 136 04/12/2016 0457   NA 138 04/11/2016 0310   NA 139 11/25/2014 1414   K 3.8 04/12/2016 0457   K 3.6 04/11/2016 0310   K 3.8 11/25/2014 1414   CL 106 04/12/2016 0457   CL 106 04/11/2016 0310   CO2 26 04/12/2016 0457   CO2 26 04/11/2016 0310   CO2 23 11/25/2014 1414   BUN 15 04/12/2016 0457   BUN 19 04/11/2016 0310   BUN 22.7 05/14/2015 0959   BUN 15.6 01/13/2015 1441   CREATININE 0.79 04/12/2016 0457   CREATININE 0.82 04/11/2016 0310   CREATININE 1.1 05/14/2015 0959   CREATININE 1.1 01/13/2015 1441   GLUCOSE 92 04/12/2016 0457   GLUCOSE 88 04/11/2016 0310   GLUCOSE 115 11/25/2014 1414    CALCIUM 7.9 (L) 04/12/2016 0457   CALCIUM 7.9 (L) 04/11/2016 0310   CALCIUM 9.2 11/25/2014 1414   AST 41 04/11/2016 0310   AST 43 (H) 04/10/2016 0402   AST 16 11/25/2014 1414   ALT 46 04/11/2016 0310   ALT 35 04/10/2016 0402   ALT 10 11/25/2014 1414   ALKPHOS 57 04/11/2016 0310   ALKPHOS 56 04/10/2016 0402   ALKPHOS 56 11/25/2014 1414   BILITOT 0.8 04/11/2016 0310   BILITOT 1.3 (H) 04/10/2016 0402   BILITOT 0.24 11/25/2014 1414   PROT 5.1 (L) 04/11/2016 0310   PROT 4.7 (L) 04/10/2016 0402   PROT 6.6 11/25/2014 1414   ALBUMIN 2.2 (L) 04/11/2016 0310   ALBUMIN 2.1 (L) 04/10/2016 0402   ALBUMIN 3.4 (L) 11/25/2014 1414    Studies/Results: Dg Swallowing Func-speech Pathology  Result Date: 04/13/2016 Objective Swallowing Evaluation: Type of Study: MBS-Modified Barium Swallow Study Patient Details Name: [name] MRN: [id number] Date of Birth: Mar 29, 1931 Today's Date: 04/13/2016 Time: SLP Start Time (ACUTE ONLY): 1430-SLP Stop Time (ACUTE ONLY): 1500 SLP Time Calculation (min) (ACUTE ONLY): 30 min Past Medical History: Past Medical History: Diagnosis Date . Anemia  . Aortic insufficiency 08/11/2015 . Basal cell cancer  . Brain tumor ([place])  . GERD (gastroesophageal reflux  disease)  . Herniated disc   lumbar spine . Hiatal hernia  . History of radiation therapy   back approximately 2010 prostate radiation under care of Dr. Valere Dross  . Hyperlipidemia  . Hypertension  . Meningioma ([place])   brain . Nocturia  . Occult blood in stools  . OSA (obstructive sleep apnea) 10/28/2014  Moderate with AHI 21/hr . Prostate cancer ([place])  . Restless leg  . Seizures ([place])  Past Surgical History: Past Surgical History: Procedure Laterality Date . APPLICATION OF CRANIAL NAVIGATION N/A [id number]  Procedure: APPLICATION OF CRANIAL NAVIGATION;  Surgeon: Consuella Lose, MD;  Location: [facility];  Service: Neurosurgery;  Laterality: N/A;  APPLICATION OF CRANIAL NAVIGATION . Bilateral cateract surgery   . COLONOSCOPY     colon polyp removed . CRANIOTOMY Right 09/27/2013  Procedure: RIGHT FRONTAL CRANIOTOMY FOR TUMOR RESECTION ;  Surgeon: Consuella Lose, MD;  Location: [facility];  Service: Neurosurgery;  Laterality: Right; . CRANIOTOMY Right 02/17/2015  Procedure: Right frontal parietal craniotomy for resection of meningioma with brainlab;  Surgeon: Consuella Lose, MD;  Location: [facility];  Service: Neurosurgery;  Laterality: Right;  Right frontal parietal craniotomy for resection of meningioma with brainlab . JOINT REPLACEMENT    right knee replacement . LAPAROTOMY N/A 04/04/2016  Procedure: EXPLORATORY LAPAROTOMY, REPAIR OF GASTRIC PERFORATION WITH OMENTAL PATCH, PLACEMENT OF GASTROSTOMY TUBE;  Surgeon: Autumn Messing III, MD;  Location: [facility];  Service: General;  Laterality: N/A; . PROSTATE BIOPSY   . TONSILLECTOMY    Agae 6-7 . VASECTOMY   HPI: 82 yo male adm to [facility] with abdominal pain - pt is s/p surgery with PEG placed 7/17 with intubation a few days.  Per wife, pt has been in ICU and has not had po until yesterday.   Pt started po intake yesteday and was coughing with liquids, therefore diet was changed to nectar liquids and SLP ordered.   Diet was advanced to regular/thin today.  Pt PMH + for meningioma s/p XRT and surgery, anemia, seizures.  Pt CXR shows atelectasis.   Subjective: pt seen in radiology for MBS Assessment / Plan / Recommendation CHL IP CLINICAL IMPRESSIONS 04/13/2016 Therapy Diagnosis Mild oral phase dysphagia;Mild pharyngeal phase dysphagia Clinical Impression Pt presents with mild oropharyngeal dysphagia, characterized by poor bolus formation of puree and solid with posterior spillage to the vallecula, and right anterior leakage of liquids. Swallow reflex was delayed, with trigger at the vallecula on the initial swallow. Poor airway closure resulted in flash penetration of thin liquids during the initial swallow. Nectar thick liquid prevented penetration of the initial swallow, however, Residue of thin  and nectar thick liquid on tongue base and vallecula was noted to spill to the pyriform prior to trigger of (cued) dry swallow.  Small boluses and immediate second swallow prevented penetration of nectar thick liquids. Recommend dys 3 solids with chopped meats and nectar thick liquids, SMALL bites and sips at a SLOW rate, with 2 swallows per bite/sip. Meds one at a time with puree. RN was notified of results and recommendations, and safe swallow precautions were posted at [facility]. ST will follow for education and assessment of diet tolerance. Recommend repeat MBS prior to advancing liquids, given lack of reflexive response to penetration on this study. Impact on safety and function Risk for inadequate nutrition/hydration;Moderate aspiration risk;Mild aspiration risk   CHL IP TREATMENT RECOMMENDATION 04/13/2016 Treatment Recommendations Therapy as outlined in treatment plan below   Prognosis 04/13/2016 Prognosis for Safe Diet Advancement Guarded Barriers to  Reach Goals Motivation CHL IP DIET RECOMMENDATION 04/13/2016 SLP Diet Recommendations Dysphagia 3 (Mech soft) solids;Nectar thick liquid Liquid Administration via No straw;Cup Medication Administration Whole meds with puree Compensations Slow rate;Small sips/bites;Clear throat intermittently;Other (Comment);Minimize environmental distractions;Multiple dry swallows after each bite/sip Postural Changes Seated upright at 90 degrees   CHL IP OTHER RECOMMENDATIONS 04/13/2016 Recommended Consults -- Oral Care Recommendations Oral care BID Other Recommendations Order thickener from pharmacy;Prohibited food (jello, ice cream, thin soups);Remove water pitcher   CHL IP FOLLOW UP RECOMMENDATIONS 04/12/2016 Follow up Recommendations To be determined   CHL IP FREQUENCY AND DURATION 04/13/2016 Speech Therapy Frequency (ACUTE ONLY) min 2x/week Treatment Duration 1 week    CHL IP ORAL PHASE 04/13/2016 Oral Phase Impaired Oral - Nectar Cup Right anterior bolus loss Oral - Thin Cup Right  anterior bolus loss Oral - Puree Reduced posterior propulsion;Delayed oral transit;Premature spillage Oral - Multi-Consistency Premature spillage;Delayed oral transit;Reduced posterior propulsion  CHL IP PHARYNGEAL PHASE 04/13/2016 Pharyngeal Phase Impaired Pharyngeal- Nectar Cup Delayed swallow initiation-vallecula;Reduced pharyngeal peristalsis;Reduced anterior laryngeal mobility;Reduced laryngeal elevation;Reduced airway/laryngeal closure;Reduced tongue base retraction;Penetration/Aspiration during swallow;Pharyngeal residue - valleculae;Delayed swallow initiation-pyriform sinuses Pharyngeal Material enters airway, remains ABOVE vocal cords then ejected out Pharyngeal- Thin Cup Delayed swallow initiation-vallecula;Delayed swallow initiation-pyriform sinuses;Reduced pharyngeal peristalsis;Reduced anterior laryngeal mobility;Reduced laryngeal elevation;Reduced airway/laryngeal closure;Pharyngeal residue - valleculae;Reduced tongue base retraction;Penetration/Aspiration during swallow Pharyngeal Material enters airway, remains ABOVE vocal cords then ejected out Pharyngeal- Puree Delayed swallow initiation-vallecula;Reduced tongue base retraction Pharyngeal- Multi-consistency Delayed swallow initiation-vallecula;Reduced tongue base retraction  CHL IP CERVICAL ESOPHAGEAL PHASE 04/13/2016 Cervical Esophageal Phase [facility], [facility], [place] Shonna Chock 04/13/2016, 3:19 PM               Assessment & Plans: s/p Procedure(s): EXPLORATORY LAPAROTOMY, REPAIR OF GASTRIC PERFORATION WITH OMENTAL PATCH, PLACEMENT OF GASTROSTOMY TUBE (N/A) Dys 3 diet per speech path recommendations Physical therapy - needs OOB, ambulation JP drain removed   Will remove central line today   Plan transfer to SNF/rehab tomorrow - they will have VAC system ready at that time  Earnstine Regal, MD, [facility] Office:  [place] 04/14/2016

## 2016-04-15 ENCOUNTER — Telehealth: Payer: Self-pay | Admitting: Radiation Oncology

## 2016-04-15 MED ORDER — LEVETIRACETAM 750 MG PO TABS
750.0000 mg | ORAL_TABLET | Freq: Two times a day (BID) | ORAL | Status: DC
  Administered 2016-04-15: 750 mg via ORAL
  Filled 2016-04-15 (×2): qty 1

## 2016-04-15 MED ORDER — DEXAMETHASONE 4 MG PO TABS: ORAL_TABLET | ORAL | Status: DC

## 2016-04-15 MED ORDER — DEXAMETHASONE 4 MG PO TABS: ORAL_TABLET | ORAL | 1 refills | Status: DC

## 2016-04-15 MED ORDER — ACETAMINOPHEN 325 MG PO TABS: 650.0000 mg | ORAL_TABLET | ORAL | 0 refills | Status: AC | PRN

## 2016-04-15 MED ORDER — FAMOTIDINE 20 MG PO TABS: 20.0000 mg | ORAL_TABLET | Freq: Two times a day (BID) | ORAL | Status: AC

## 2016-04-15 MED ORDER — ONDANSETRON 4 MG PO TBDP: 4.0000 mg | ORAL_TABLET | Freq: Four times a day (QID) | ORAL | 0 refills | Status: AC | PRN

## 2016-04-15 MED ORDER — IRBESARTAN 75 MG PO TABS: 75.0000 mg | ORAL_TABLET | Freq: Every day | ORAL | 0 refills | Status: DC

## 2016-04-15 MED ORDER — ROPINIROLE HCL 3 MG PO TABS: 1.5000 mg | ORAL_TABLET | Freq: Every day | ORAL | 3 refills | Status: DC

## 2016-04-15 MED ORDER — RESOURCE THICKENUP CLEAR PO POWD: ORAL | Status: DC

## 2016-04-15 MED ORDER — OXYCODONE HCL 5 MG PO TABS: 5.0000 mg | ORAL_TABLET | ORAL | 0 refills | Status: DC | PRN

## 2016-04-15 MED ORDER — ALBUTEROL SULFATE (2.5 MG/3ML) 0.083% IN NEBU: 2.5000 mg | INHALATION_SOLUTION | RESPIRATORY_TRACT | 12 refills | Status: AC | PRN

## 2016-04-15 NOTE — Clinical Social Work Placement (Signed)
   CLINICAL SOCIAL WORK PLACEMENT  NOTE  Date:  04/15/2016  Patient Details  Name: [name] MRN: [id number] Date of Birth: [birth year]  Clinical Social Work is seeking post-discharge placement for this patient at the [place] level of care (*CSW will initial, date and re-position this form in  chart as items are completed):  Yes   Patient/family provided with [facility] Work Department's list of facilities offering this level of care within the geographic area requested by the patient (or if unable, by the patient's family).  Yes   Patient/family informed of their freedom to choose among providers that offer the needed level of care, that participate in Medicare, Medicaid or managed care program needed by the patient, have an available bed and are willing to accept the patient.  Yes   Patient/family informed of [facility]'s ownership interest in [facility] and [facility], as well as of the fact that they are under no obligation to receive care at these facilities.  PASRR submitted to EDS on       PASRR number received on       Existing PASRR number confirmed on 04/09/16     FL2 transmitted to all facilities in geographic area requested by pt/family on 04/09/16     FL2 transmitted to all facilities within larger geographic area on       Patient informed that his/her managed care company has contracts with or will negotiate with certain facilities, including the following:        Yes   Patient/family informed of bed offers received.  Patient chooses bed at [facility]     Physician recommends and patient chooses bed at      Patient to be transferred to [facility] on 04/15/16.  Patient to be transferred to facility by [facility]     Patient family notified on 04/15/16 of transfer.  Name of family member notified:  Spouse     PHYSICIAN       Additional Comment: Pt / spouse are in agreement with d/c to [facility] today. [facility]  transport required. Medical necessity form completed. Ambulance authorization requested. Pt / spouse are aware out of pocket costs may be associated with [facility] transport. BCBS medicare has provided authorization for SNF placement. D/C Summary sent to SNF for review. Scripts included in d/c packet. # for report provided to [place].  _______________________________________________ Luretha Rued, [place]  [phone] 04/15/2016, 1:54 PM

## 2016-04-15 NOTE — Discharge Summary (Signed)
Physician Discharge Summary  Patient ID: [name] MRN: [id number] DOB/AGE: [birth year] 82 y.o.  Admit date: 04/04/2016 Discharge date: 04/15/2016  Admission Diagnoses:  Perforated gastric ulcer posterior wall, lesser sac. Hiatal hernia with a large portion of the stomach in the chest. Acute kidney injury  History of Grade II right front meningioma: S/p surgical resection 02/17/15, and radiation therapy  Gait instability secondary to Meningioma  History of prostate cancer Hypertension History of seizures History of restless leg syndrome GERD BPH Hx of OSA  Discharge Diagnoses:  Perforated gastric ulcer posterior wall, lesser sac. Hiatal hernia with a large portion of the stomach in the chest. Postop ventilatory dependent respiratory failure/history of obstructive sleep apnea. Postoperative shock-hypovolemic versus septic. Acute kidney injury - resolved Anemia - Acute blood loss status post 2 units packed cells in the OR & iron deficiency anemia History of Grade II right front meningioma: S/p surgical resection 02/17/15, and radiation therapy  Gait instability secondary to Meningioma  Mild oral phase dysphagia;Mild pharyngeal phase dysphagia - recommendation Dysphagia III Post op delirium - resolved History of prostate cancer Hypertension History of seizures History of restless leg syndrome GERD BPH  Principal Problem:   Perforated bowel ([place]) Active Problems:   Acute kidney injury ([place])   Hypertension   Gait instability   Anemia, iron deficiency   Meningioma, recurrent of brain (H[facility])   OSA (obstructive sleep apnea)   Acute delirium   PROCEDURES: Status post exploratory laparotomy, repair of gastric perforation with omental patch, placement of gastrostomy tube and repair of umbilical hernia. 04/05/16 Dr. Autumn Messing.   Hospital Course:  The patient is an 82yo wm who presents with severe abdominal pain that started suddenly about 5pm today. It is associated with nausea  and vomiting. No fever. He came to ER and CT shows large hiatal hernia and free air thought to possibly be coming from upper GI tract.  Patient was seen in the emergency room by Dr. Autumn Messing. It was his opinion he had a perforation of his intestines and needed to go to the operating room emergently. Risk and benefits were discussed and family agreed he was taken to surgery at that time. Findings at the time of surgery included a perforated gastric ulcer umbilical hernia and a significant hiatal hernia. The perforation was in the mid body of the posterior wall, and this was repaired. After completion of surgery he was transferred to the intensive care unit and was initially managed by critical care medicine.  Postop complications included: Ventilatory dependent respiratory failure, acute kidney injury, shock, acute blood loss with 2 units of packed cells given intraoperatively. These were managed and improved in the critical care unit. Additional postoperative problems included delirium and dysphasia. The delirium has resolved. Speech therapy evaluated and placed him on a dysphagia 3 diet. He completed a five-day course of Zosyn and antibiotics were discontinued. He has made slow steady improvement from his surgical issues. The gastrostomy tube is in place and requires irrigation twice a day, but is not being used for feedings or medicines. He has been maintained on Pepcid for his gastric ulcer and this can be continued at the skilled nursing facility. He was seen by radiation therapy and Dr. Tammi Klippel a multidisciplinary brain conference was held on 04/13/2016. Dr. Tammi Klippel and Dr.Nundkumar did not feel radiotherapy options were available nor were there any additional surgical options. The group and conference recommended palliative evaluation and slow steroid taper. Their recommendations were to decrease the dose by half for  a week and once the patient's tapers down to 0.5 mg of dexamethasone it could be  discontinued. We have continued the dexamethasone for 1 week at the current rate with instructions to call Dr. Johny Shears office for further instructions on this. The patient's diet has been advanced to a dysphagia 3, he is tolerating this well. Bowel function has returned. He continues to have a condom cath in place. Physical therapy notes ambulation is limited distance and requires plus to assist along with an EVA walker. His JP drain has been removed. He has a gastrostomy tube which requires flushing twice a day but we plan to leave it capped.  He requires  these sites to be cleaned twice a day and redressed. Follow-ups are listed below. He is to call and see Dr. Marlou Starks in about 2 weeks for follow-up. He has a wound VAC in place over the open abdominal wound and this will be continued in the skilled nursing facility and followed up by Dr. Marlou Starks.  Condition on discharge: Improved.  CBC Latest Ref Rng & Units 04/11/2016 04/10/2016 04/09/2016  WBC 4.0 - 10.5 K/uL 14.0(H) 10.5 10.5  Hemoglobin 13.0 - 17.0 g/dL 12.4(L) 10.9(L) 10.5(L)  Hematocrit 39.0 - 52.0 % 39.2 35.7(L) 33.7(L)  Platelets 150 - 400 K/uL 179 176 180     CMP Latest Ref Rng & Units 04/12/2016 04/11/2016 04/10/2016  Glucose 65 - 99 mg/dL 92 88 94  BUN 6 - 20 mg/dL 15 19 24(H)  Creatinine 0.61 - 1.24 mg/dL 0.79 0.82 0.89  Sodium 135 - 145 mmol/L 136 138 141  Potassium 3.5 - 5.1 mmol/L 3.8 3.6 3.0(L)  Chloride 101 - 111 mmol/L 106 106 108  CO2 22 - 32 mmol/L 26 26 26   Calcium 8.9 - 10.3 mg/dL 7.9(L) 7.9(L) 7.9(L)  Total Protein 6.5 - 8.1 g/dL - 5.1(L) 4.7(L)  Total Bilirubin 0.3 - 1.2 mg/dL - 0.8 1.3(H)  Alkaline Phos 38 - 126 U/L - 57 56  AST 15 - 41 U/L - 41 43(H)  ALT 17 - 63 U/L - 46 35      Disposition: He is being discharged to a skilled nursing facility     Medication List    STOP taking these medications   furosemide 20 MG tablet Commonly known as:  LASIX   magic mouthwash Soln   magnesium gluconate 500 MG  tablet Commonly known as:  MAGONATE   oxyCODONE 5 MG immediate release tablet Commonly known as:  Oxy IR/ROXICODONE   PROBIOTIC DAILY PO   valsartan 160 MG tablet Commonly known as:  DIOVAN     TAKE these medications   acetaminophen 325 MG tablet Commonly known as:  TYLENOL Take 2 tablets (650 mg total) by mouth every 4 (four) hours as needed. What changed:  medication strength  how much to take  when to take this  reasons to take this   albuterol (2.5 MG/3ML) 0.083% nebulizer solution Commonly known as:  PROVENTIL Take 3 mLs (2.5 mg total) by nebulization every 4 (four) hours as needed for wheezing or shortness of breath.   b complex vitamins tablet Take 1 tablet by mouth daily.   dexamethasone 4 MG tablet Commonly known as:  DECADRON Call Dr. Johny Shears office for instructions on tapering this steroid. What changed:  how much to take  how to take this  when to take this  additional instructions   famotidine 20 MG tablet Commonly known as:  PEPCID Take 1 tablet (20 mg total) by mouth 2 (  two) times daily. What changed:  when to take this   ferrous sulfate 325 (65 FE) MG tablet Take 1,300 mg by mouth every morning.   irbesartan 75 MG tablet Commonly known as:  AVAPRO Take 1 tablet (75 mg total) by mouth daily.   lamoTRIgine 25 MG tablet Commonly known as:  LAMICTAL Take 4 tablets by mouth 2 (two) times daily.   levETIRAcetam 500 MG tablet Commonly known as:  KEPPRA Take 1 & 1/2 tablets twice a day   multivitamin-lutein Caps capsule Take 1 capsule by mouth daily.   ondansetron 4 MG disintegrating tablet Commonly known as:  ZOFRAN-ODT Take 1 tablet (4 mg total) by mouth every 6 (six) hours as needed for nausea.   RESOURCE THICKENUP CLEAR Powd Use container instructions   rOPINIRole 3 MG tablet Commonly known as:  REQUIP Take 0.5 tablets (1.5 mg total) by mouth daily. Take 3mg s at bedtime.  May take 1.5mg s during the day for restless legs then  take the other 1.5mg s at bedtime (no more than 3 mgs daily) What changed:  how much to take   solifenacin 5 MG tablet Commonly known as:  VESICARE Take 1 tablet (5 mg total) by mouth daily.   tamsulosin 0.4 MG Caps capsule Commonly known as:  FLOMAX Take 1 capsule (0.4 mg total) by mouth daily after supper.   vitamin B-12 100 MCG tablet Commonly known as:  CYANOCOBALAMIN Take 100 mcg by mouth daily.       Contact information for follow-up providers     Melinda Crutch, MD .   Specialty:  Family Medicine Why:  CAll and let him follow up on all medications after he leaves Rehab facility. Contact information: [place] [address] [phone]        Merrie Roof, MD .   Specialty:  General Surgery Why:  Call for follow up in 2 weeks. Contact information: [address] [place] [address] [phone]        Tyler Pita, MD .   Specialty:  Radiation Oncology Why:  CAll and let him see him in 1-2 weeks, also call and get steroid weaning instructions from Dr. Johny Shears office. Contact information: [place] [address] [phone]            Contact information for after-discharge care    Destination    [facility] .   Specialty:  [place] Contact information: [address] [place] [phone]                  Signed: Earnstine Regal 04/15/2016, 9:03 AM

## 2016-04-15 NOTE — Progress Notes (Signed)
Report called to [facility] RN accepting report for facility. Informed facility that Abd Incision wound vac would be removed and wet to dry dressing placed until Pt reaches facility to replace wound vac.Pt's assessment unchanged at time of transfer and VSS

## 2016-04-15 NOTE — Care Management Note (Signed)
Case Management Note  Patient Details  Name: [name] MRN: [id number] Date of Birth: 01-Feb-1931  Subjective/Objective:  Dc SNF w/GT, wound vac(SNF). CSW already following.                  Action/Plan:d/c SNF.   Expected Discharge Date:   (UNKNOWN)               Expected Discharge Plan:  Skilled Nursing Facility  In-House Referral:  Clinical Social Work  Discharge planning Services  CM Consult  Post Acute Care Choice:    Choice offered to:     DME Arranged:    DME Agency:     HH Arranged:    [place] Agency:     Status of Service:  Completed, signed off  If discussed at [employer], dates discussed:    Additional Comments:  Dessa Phi, RN 04/15/2016, 10:08 AM

## 2016-04-15 NOTE — Telephone Encounter (Addendum)
Per Shona Simpson, PA-C called Andee Poles, RN in admissions with Decadron taper. Below was the taper advised.   Decadron 4 mg x 7 days then, decadron 2 mg x 7 days then, decadron 1 mg x 7 days then, decadron 0.5 mg x 7 days and stop.   Andee Poles, RN verbalized understanding and expressed appreciation for the call.

## 2016-04-15 NOTE — Progress Notes (Signed)
11 Days Post-Op  Subjective: Alert and seems oriented, knows he is going to SNF today but cannot remember where.  Knows his wife has an appointment at 77 and doesn't want him to go till she can be here.  He is tolerating PO DIII diet, positive BM reported and he remembers BM yesterday.  Says he isn't real hungry but ate about 30-40% of his AM tray.  Wound vac in place.  Gastrostomy site OK some drainage on the dressing. Will need to be cleaned more frequently.  Drain site [employer].    Objective: Vital signs in last 24 hours: Temp:  [98.2 F (36.8 C)-98.8 F (37.1 C)] 98.4 F (36.9 C) (07/28 0357) Pulse Rate:  [76-80] 76 (07/28 0357) Resp:  [18-20] 20 (07/28 0357) BP: (135-138)/(72-79) 137/72 (07/28 0357) SpO2:  [93 %-98 %] 97 % (07/28 0357) Last BM Date: 04/14/16 240 PO recorded 1650 urine BM x 2 Afebrile, VSS  Last labs 04/12/16   Intake/Output from previous day: 07/27 0701 - 07/28 0700 In: 240 [P.O.:240] Out: 1650 [Urine:1650] Intake/Output this shift: No intake/output data recorded.  General appearance: alert, appears stated age, no distress Resp: alert, appears stated age, no distress and seems oriented this AM. GI:  Soft nontender, a few bowel sounds. Wound VAC in place. Gastrostomy site intact some drainage on the dressing. The dressing over the drain site is clean. Extremities: extremities normal, atraumatic, no cyanosis or edema  Lab Results:  No results for input(s): WBC, HGB, HCT, PLT in the last 72 hours.  BMET No results for input(s): NA, K, CL, CO2, GLUCOSE, BUN, CREATININE, CALCIUM in the last 72 hours. PT/INR No results for input(s): LABPROT, INR in the last 72 hours.   Recent Labs Lab 04/10/16 0402 04/11/16 0310  AST 43* 41  ALT 35 46  ALKPHOS 56 57  BILITOT 1.3* 0.8  PROT 4.7* 5.1*  ALBUMIN 2.1* 2.2*     Lipase     Component Value Date/Time   LIPASE 19 04/04/2016 1956     Studies/Results: Dg Swallowing Func-speech Pathology  Result Date:  04/13/2016 Objective Swallowing Evaluation: Type of Study: MBS-Modified Barium Swallow Study Patient Details Name: KELON CONKIN MRN: [id number] Date of Birth: [birth year] Today's Date: 04/13/2016 Time: SLP Start Time (ACUTE ONLY): 1430-SLP Stop Time (ACUTE ONLY): 1500 SLP Time Calculation (min) (ACUTE ONLY): 30 min Past Medical History: Past Medical History: Diagnosis Date . Anemia  . Aortic insufficiency 08/11/2015 . Basal cell cancer  . Brain tumor ([place])  . GERD (gastroesophageal reflux disease)  . Herniated disc   lumbar spine . Hiatal hernia  . History of radiation therapy   back approximately 2010 prostate radiation under care of Dr. Valere Dross  . Hyperlipidemia  . Hypertension  . Meningioma ([place])   brain . Nocturia  . Occult blood in stools  . OSA (obstructive sleep apnea) 10/28/2014  Moderate with AHI 21/hr . Prostate cancer ([place])  . Restless leg  . Seizures ([place])  Past Surgical History: Past Surgical History: Procedure Laterality Date . APPLICATION OF CRANIAL NAVIGATION N/A [id number]  Procedure: APPLICATION OF CRANIAL NAVIGATION;  Surgeon: Consuella Lose, MD;  Location: [facility];  Service: Neurosurgery;  Laterality: N/A;  APPLICATION OF CRANIAL NAVIGATION . Bilateral cateract surgery   . COLONOSCOPY    colon polyp removed . CRANIOTOMY Right 09/27/2013  Procedure: RIGHT FRONTAL CRANIOTOMY FOR TUMOR RESECTION ;  Surgeon: Consuella Lose, MD;  Location: [facility];  Service: Neurosurgery;  Laterality: Right; . CRANIOTOMY Right 02/17/2015  Procedure:  Right frontal parietal craniotomy for resection of meningioma with brainlab;  Surgeon: Consuella Lose, MD;  Location: [facility];  Service: Neurosurgery;  Laterality: Right;  Right frontal parietal craniotomy for resection of meningioma with brainlab . JOINT REPLACEMENT    right knee replacement . LAPAROTOMY N/A 04/04/2016  Procedure: EXPLORATORY LAPAROTOMY, REPAIR OF GASTRIC PERFORATION WITH OMENTAL PATCH, PLACEMENT OF GASTROSTOMY TUBE;  Surgeon: Autumn Messing III,  MD;  Location: [facility];  Service: General;  Laterality: N/A; . PROSTATE BIOPSY   . TONSILLECTOMY    Agae 6-7 . VASECTOMY   HPI: 82 yo male adm to [facility] with abdominal pain - pt is s/p surgery with PEG placed 7/17 with intubation a few days.  Per wife, pt has been in ICU and has not had po until yesterday.   Pt started po intake yesteday and was coughing with liquids, therefore diet was changed to nectar liquids and SLP ordered.   Diet was advanced to regular/thin today.  Pt PMH + for meningioma s/p XRT and surgery, anemia, seizures.  Pt CXR shows atelectasis.   Subjective: pt seen in radiology for MBS Assessment / Plan / Recommendation CHL IP CLINICAL IMPRESSIONS 04/13/2016 Therapy Diagnosis Mild oral phase dysphagia;Mild pharyngeal phase dysphagia Clinical Impression Pt presents with mild oropharyngeal dysphagia, characterized by poor bolus formation of puree and solid with posterior spillage to the vallecula, and right anterior leakage of liquids. Swallow reflex was delayed, with trigger at the vallecula on the initial swallow. Poor airway closure resulted in flash penetration of thin liquids during the initial swallow. Nectar thick liquid prevented penetration of the initial swallow, however, Residue of thin and nectar thick liquid on tongue base and vallecula was noted to spill to the pyriform prior to trigger of (cued) dry swallow.  Small boluses and immediate second swallow prevented penetration of nectar thick liquids. Recommend dys 3 solids with chopped meats and nectar thick liquids, SMALL bites and sips at a SLOW rate, with 2 swallows per bite/sip. Meds one at a time with puree. RN was notified of results and recommendations, and safe swallow precautions were posted at [facility]. ST will follow for education and assessment of diet tolerance. Recommend repeat MBS prior to advancing liquids, given lack of reflexive response to penetration on this study. Impact on safety and function Risk for inadequate  nutrition/hydration;Moderate aspiration risk;Mild aspiration risk   CHL IP TREATMENT RECOMMENDATION 04/13/2016 Treatment Recommendations Therapy as outlined in treatment plan below   Prognosis 04/13/2016 Prognosis for Safe Diet Advancement Guarded Barriers to Reach Goals Motivation CHL IP DIET RECOMMENDATION 04/13/2016 SLP Diet Recommendations Dysphagia 3 (Mech soft) solids;Nectar thick liquid Liquid Administration via No straw;Cup Medication Administration Whole meds with puree Compensations Slow rate;Small sips/bites;Clear throat intermittently;Other (Comment);Minimize environmental distractions;Multiple dry swallows after each bite/sip Postural Changes Seated upright at 90 degrees   CHL IP OTHER RECOMMENDATIONS 04/13/2016 Recommended Consults -- Oral Care Recommendations Oral care BID Other Recommendations Order thickener from pharmacy;Prohibited food (jello, ice cream, thin soups);Remove water pitcher   CHL IP FOLLOW UP RECOMMENDATIONS 04/12/2016 Follow up Recommendations To be determined   CHL IP FREQUENCY AND DURATION 04/13/2016 Speech Therapy Frequency (ACUTE ONLY) min 2x/week Treatment Duration 1 week    CHL IP ORAL PHASE 04/13/2016 Oral Phase Impaired Oral - Nectar Cup Right anterior bolus loss Oral - Thin Cup Right anterior bolus loss Oral - Puree Reduced posterior propulsion;Delayed oral transit;Premature spillage Oral - Multi-Consistency Premature spillage;Delayed oral transit;Reduced posterior propulsion  CHL IP PHARYNGEAL PHASE 04/13/2016 Pharyngeal Phase Impaired Pharyngeal-  Nectar Cup Delayed swallow initiation-vallecula;Reduced pharyngeal peristalsis;Reduced anterior laryngeal mobility;Reduced laryngeal elevation;Reduced airway/laryngeal closure;Reduced tongue base retraction;Penetration/Aspiration during swallow;Pharyngeal residue - valleculae;Delayed swallow initiation-pyriform sinuses Pharyngeal Material enters airway, remains ABOVE vocal cords then ejected out Pharyngeal- Thin Cup Delayed swallow  initiation-vallecula;Delayed swallow initiation-pyriform sinuses;Reduced pharyngeal peristalsis;Reduced anterior laryngeal mobility;Reduced laryngeal elevation;Reduced airway/laryngeal closure;Pharyngeal residue - valleculae;Reduced tongue base retraction;Penetration/Aspiration during swallow Pharyngeal Material enters airway, remains ABOVE vocal cords then ejected out Pharyngeal- Puree Delayed swallow initiation-vallecula;Reduced tongue base retraction Pharyngeal- Multi-consistency Delayed swallow initiation-vallecula;Reduced tongue base retraction  CHL IP CERVICAL ESOPHAGEAL PHASE 04/13/2016 Cervical Esophageal Phase [facility] [place], [facility], CCC-SLP [id number] Shonna Chock 04/13/2016, 3:19 PM               Medications: . antiseptic oral rinse  7 mL Mouth Rinse BID  . dexamethasone  4 mg Oral Daily  . famotidine  20 mg Oral BID  . heparin  5,000 Units Subcutaneous Q8H  . irbesartan  75 mg Oral Daily  . lamoTRIgine  100 mg Oral BID  . levETIRAcetam  750 mg Oral BID  . rOPINIRole  1.5 mg Oral BID  . tamsulosin  0.4 mg Oral QPC supper    History of prostate cancer Hypertension History of seizures History of restless leg syndrome GERD BPH  Assessment/Plan Perforated gastric ulcer posterior wall, lesser sac. Hiatal hernia with a large portion of the stomach in the chest. Status post exploratory laparotomy, repair of gastric perforation with omental patch, placement of gastrostomy tube and repair of umbilical hernia. 04/05/16 Dr. Autumn Messing. Postop ventilatory dependent respiratory failure/history of obstructive sleep apnea. Postoperative shock- hypovolemic versus septic. Acute kidney injury - resolved Anemia - Acute blood loss status post 2 units packed cells in the OR & iron deficiency anemia History of Grade II right front meningioma: S/p surgical resection 02/17/15, and radiation therapy  Gait instability secondary to Meningioma  Mild oral phase dysphagia;Mild pharyngeal phase dysphagia  - recommendation Dysphagia III Post op delirium - resolved FEN: Dysphagia III ID:  [email or web address] x 5 days, completed  04/11/16 DVT:  SCD/heparin     Plan:   Transfer to skilled nursing facility today.                                                          LOS: 11 days    Reilly Molchan 04/15/2016 [phone]

## 2016-04-15 NOTE — Discharge Instructions (Signed)
[facility]      [facility] °[phone] ° °OPEN ABDOMINAL SURGERY: POST OP INSTRUCTIONS ° °Always review your discharge instruction sheet given to you by the facility where your surgery was performed. ° °IF YOU HAVE DISABILITY OR FAMILY LEAVE FORMS, YOU MUST BRING THEM TO THE OFFICE FOR PROCESSING.  PLEASE DO NOT GIVE THEM TO YOUR DOCTOR. ° °1. A prescription for pain medication may be given to you upon discharge.  Take your pain medication as prescribed, if needed.  If narcotic pain medicine is not needed, then you may take acetaminophen (Tylenol) or ibuprofen (Advil) as needed. °2. Take your usually prescribed medications unless otherwise directed. °3. If you need a refill on your pain medication, please contact your pharmacy. They will contact our office to request authorization.  Prescriptions will not be filled after 5pm or on week-ends. °4. You should follow a light diet the first few days after arrival home, such as soup and crackers, pudding, etc.unless your doctor has advised otherwise. A high-fiber, low fat diet can be resumed as tolerated.   Be sure to include lots of fluids daily. Most patients will experience some swelling and bruising on the chest and neck area.  Ice packs will help.  Swelling and bruising can take several days to resolve °5. Most patients will experience some swelling and bruising in the area of the incision. Ice pack will help. Swelling and bruising can take several days to resolve..  °6. It is common to experience some constipation if taking pain medication after surgery.  Increasing fluid intake and taking a stool softener will usually help or prevent this problem from occurring.  A mild laxative (Milk of Magnesia or Miralax) should be taken according to package directions if there are no bowel movements after 48 hours. °7.  You may have steri-strips (small skin tapes) in place directly over the incision.  These strips should be left on the skin for 7-10 days.  If your  surgeon used skin glue on the incision, you may shower in 24 hours.  The glue will flake off over the next 2-3 weeks.  Any sutures or staples will be removed at the office during your follow-up visit. You may find that a light gauze bandage over your incision may keep your staples from being rubbed or pulled. You may shower and replace the bandage daily. °8. ACTIVITIES:  You may resume regular (light) daily activities beginning the next day--such as daily self-care, walking, climbing stairs--gradually increasing activities as tolerated.  You may have sexual intercourse when it is comfortable.  Refrain from any heavy lifting or straining until approved by your doctor. °a. You may drive when you no longer are taking prescription pain medication, you can comfortably wear a seatbelt, and you can safely maneuver your car and apply brakes °b. Return to Work: ___________________________________ °9. You should see your doctor in the office for a follow-up appointment approximately two weeks after your surgery.  Make sure that you call for this appointment within a day or two after you arrive home to insure a convenient appointment time. °OTHER INSTRUCTIONS:  °_____________________________________________________________ °_____________________________________________________________ ° °WHEN TO CALL YOUR DOCTOR: °1. Fever over 101.0 °2. Inability to urinate °3. Nausea and/or vomiting °4. Extreme swelling or bruising °5. Continued bleeding from incision. °6. Increased pain, redness, or drainage from the incision. °7. Difficulty swallowing or breathing °8. Muscle cramping or spasms. °9. Numbness or tingling in hands or feet or around lips. ° °The clinic staff is available to   answer your questions during regular business hours.  Please don’t hesitate to call and ask to speak to one of the nurses if you have concerns. ° °For further questions, please visit [email or web address] ° °Vacuum-Assisted Closure  Therapy °Vacuum-assisted closure (VAC) therapy uses a device that removes fluid and germs from wounds to help them heal. It is used on wounds that cannot be closed with stitches. They often heal slowly. Vacuum-assisted therapy helps the wound stay clean and healthy while the open wound slowly grows back together. °Vacuum-assisted closure therapy uses a bandage (dressing) that is made of foam. It is put inside the wound. Then, a drape is placed over the wound. This drape sticks to your skin to keep air out, and to protect the wound. A tube is hooked up to a small pump and is attached to the drape. The pump sucks out the fluid and germs. Vacuum-assisted closure therapy can also help reduce the bad smell that comes from the wound. °HOW DOES IT WORK?  °The vacuum pump pulls fluid through the foam dressing. The dressing may wrinkle during this process. The fluid goes into the tube and away from the wound. The fluid then goes into a container. The fluid in the container must be replaced if it is full or at least once a week, even if the container is not full. The pulling from the pump helps to close the wound and bring better circulation to the wound area.  The foam dressing covers and protects the wound. It helps your wound heal faster.  °HOW DOES IT FEEL?  °· You might feel a little pulling when the pump is on. °· You might also feel a mild vibrating sensation.   °· You might feel some discomfort when the dressing is taken off. °CAN I MOVE AROUND WITH VACUUM-ASSISTED CLOSURE THERAPY? °Yes, it has a backup battery which is used when the machine is not plugged in, as long as the battery is working, you can move freely. °WHAT ARE SOME THINGS I MUST KNOW? °· Do not turn off the pump yourself, unless instructed to do so by your healthcare provider, such as for bathing. °· Do not take off the dressing yourself, unless instructed to do so by your caregiver. °· You can wash or shower with the dressing. However, do not take the  pump into the shower. Make sure the wound dressing is protected and covered with plastic. The wound area must stay dry. °· Do not turn off the pump for more than 2 hours. If the pump is off for more than 2 hours, your nurse must change your dressing. °· Check frequently that the machine is on, that the machine indicates the therapy is on, and that all clamps are open. °THE ALARM IS SOUNDING! WHAT SHOULD I DO?  °· Stay calm. °· Do not turn off the pump or do anything with the dressing. °· Call your clinic or caregiver right away if the alarm goes off and you cannot fix the problem. Some reasons the alarm might go off include: °¨ The fluid collection container is full. °¨ The battery is low. °¨ The dressing has a leak. °· Explain to your caregiver what is happening. Follow the instructions you receive. °WHEN SHOULD I CALL FOR HELP?  °· You have severe pain. °· You have difficulty breathing. °· You have bleeding that will not stop. °· Your wound smells bad. °· You have redness, swelling, or fluid leaking from your wound. °· Your alarm goes off and you do not   know what to do.  You have a fever.  Your wound itches severely.  Your dressing changes are often painful or bleeding often occurs.  You have diarrhea.  You have a sore throat.  You have a rash around the dressing or anywhere else on your body.  You feel nauseous.  You feel dizzy or weak.  The [facility] machine has been off for more than 2 hours. HOW DO I GET READY TO GO HOME WITH A PUMP?  A trained caregiver will talk to you and answer your questions about your vacuum-assisted closure therapy before you go home. He or she will explain what to expect. A caregiver will come to your home to apply the pump and care for your wound. The at-home caregiver will be available for questions and will come back for the scheduled dressing changes, usually every 48-72 hours (or more often for severely infected wounds). Your at-home caregiver will also come if you  are having an unexpected problem. If you have questions or do not know what to do when you go home, talk to your healthcare provider.   This information is not intended to replace advice given to you by your health care provider. Make sure you discuss any questions you have with your health care provider.   Document Released: 08/18/2008 Document Revised: 05/08/2013 Document Reviewed: 08/19/2011 Elsevier Interactive Patient Education 2016 [place] for G tube and for drain site. A dressing is a material placed over wounds. It keeps the wound clean, dry, and protected from further injury. Clean sites with soap and water daily. BEFORE YOU BEGIN  Get your supplies together. Things you may need include:  Tape.  Gloves.  Belly (abdominal) pads.  Gauze squares.  Plastic bags.  Take pain medicine 30 minutes before the bandage change if you need it.  Take a shower before you do the first bandage change of the day. Put plastic wrap or a bag over the dressing. REMOVING YOUR OLD BANDAGE  Wash your hands with soap and water. Dry your hands with a clean towel.  Put on your gloves.  Remove any tape.  Remove the old bandage as told. If it sticks, put a small amount of warm water on it to loosen the bandage.  Remove any gauze or packing tape in your wound.  Take off your gloves.  Put the gloves, tape, gauze, or any packing tape in a plastic bag. CHANGING YOUR BANDAGE  Open the supplies.  Take the cap off the salt solution.  Open the gauze. Leave the gauze on the inside of the package.  Put on your gloves.  Clean your wound as told by your doctor.  Keep your wound dry if your doctor told you to do so.  Your doctor may tell you to do one or more of the following:  Pick up the gauze. Pour the salt solution over the gauze. Squeeze out the extra salt solution.  Put medicated cream or other medicine on your wound.  Put solution soaked gauze only in your  wound, not on the skin around it.  Pack your wound loosely.  Put dry gauze on your wound.  Put belly pads over the dry gauze if your bandages soak through.  Tape the bandage in place so it will not fall off. Do not wrap the tape all the way around your arm or leg.  Wrap the bandage with the flexible gauze bandage as told by your doctor.  Take off your  gloves. Put them in the plastic bag with the old bandage. Tie the bag shut and throw it away.  Keep the bandage clean and dry.  Wash your hands. GET HELP RIGHT AWAY IF:   Your skin around the wound looks red.  Your wound feels more tender or sore.  You see yellowish-white fluid (pus) in the wound.  Your wound smells bad.  You have a fever.  Your skin around the wound has a red rash that itches and burns.  You see black or yellow skin in your wound that was not there before.  You feel sick to your stomach (nauseous), throw up (vomit), and feel very tired.   This information is not intended to replace advice given to you by your health care provider. Make sure you discuss any questions you have with your health care provider.   Document Released: 12/02/2008 Document Revised: 09/26/2014 Document Reviewed: 07/17/2011 Elsevier Interactive Patient Education 2016 [place] of a Feeding Tube- we plan to leave this clamped  People who have trouble swallowing or cannot take food or medicine by mouth are sometimes given feeding tubes. A feeding tube can go into the nose and down to the stomach or through the skin in the abdomen and into the stomach or small bowel. Some of the names of these feeding tubes are gastrostomy tubes, PEG lines, nasogastric tubes, and gastrojejunostomy tubes.  SUPPLIES NEEDED TO CARE FOR THE TUBE SITE  Clean gloves.  Clean wash cloth, gauze pads, or soft paper towel.  Cotton swabs.  Skin barrier ointment or cream.  Soap and water.  Pre-cut foam pads or gauze (that go around the  tube).  Tube tape. TUBE SITE CARE 1. Have all supplies ready and available. 2. Wash hands well. 3. Put on clean gloves. 4. Remove the soiled foam pad or gauze, if present, that is found under the tube stabilizer. Change the foam pad or gauze daily or when soiled or moist. 5. Check the skin around the tube site for redness, rash, swelling, drainage, or extra tissue growth. If you notice any of these, call your caregiver. 6. Moisten gauze and cotton swabs with water and soap. 7. Wipe the area closest to the tube (right near the stoma) with cotton swabs. Wipe the surrounding skin with moistened gauze. Rinse with water. 8. Dry the skin and stoma site with a dry gauze pad or soft paper towel. Do not use antibiotic ointments at the tube site. 9. If the skin is red, apply a skin barrier cream or ointment (such as petroleum jelly) in a circular motion, using a cotton swab. The cream or ointment will provide a moisture barrier for the skin and helps with wound healing. 10. Apply a new pre-cut foam pad or gauze around the tube. Secure it with tape around the edges. If no drainage is present, foam pads or gauze may be left off. 11. Use tape or an anchoring device to fasten the feeding tube to the skin for comfort or as directed. Rotate where you tape the tube to avoid skin damage from the adhesive. 12. Position the person in a semi-upright position (30-45 degree angle). 13. Throw away used supplies. 14. Remove gloves. 15. Wash hands. SUPPLIES NEEDED TO FLUSH A FEEDING TUBE  Clean gloves.  60 mL syringe (that connects to the feeding tube).  Towel.  Water. FLUSHING A FEEDING TUBE  1. Have all supplies ready and available. 2. Wash hands well. 3. Put on clean gloves. 4. Draw  up 30 mL of water in the syringe. 5. Kink the feeding tube while disconnecting it from the feeding-bag tubing or while removing the plug at the end of the tube. Kinking closes the tube and prevents secretions in the tube from  spilling out. 6. Insert the tip of the syringe into the end of the feeding tube. Release the kink. Slowly inject the water. 7. If unable to inject the water, the person with the feeding tube should lay on his or her left side. The tip of the tube may be against the stomach wall, blocking fluid flow. Changing positions may move the tip away from the stomach wall. After repositioning, try injecting the water again. 8. After injecting the water, remove the syringe. 9. Always flush before giving the first medicine, between medicines, and after the final medicine before starting a feeding. This prevents medicines from clogging the tube. 10. Throw away used supplies. 11. Remove gloves. 12. Wash hands.   This information is not intended to replace advice given to you by your health care provider. Make sure you discuss any questions you have with your health care provider.   Document Released: 09/05/2005 Document Revised: 08/22/2012 Document Reviewed: 04/19/2012 Elsevier Interactive Patient Education [employer].

## 2016-04-18 ENCOUNTER — Ambulatory Visit: Payer: Self-pay | Admitting: Radiation Oncology

## 2016-04-18 ENCOUNTER — Other Ambulatory Visit: Payer: Self-pay | Admitting: Neurology

## 2016-04-18 ENCOUNTER — Non-Acute Institutional Stay (SKILLED_NURSING_FACILITY): Payer: Medicare Other | Admitting: Internal Medicine

## 2016-04-18 ENCOUNTER — Encounter: Payer: Self-pay | Admitting: Internal Medicine

## 2016-04-18 DIAGNOSIS — G40109 Localization-related (focal) (partial) symptomatic epilepsy and epileptic syndromes with simple partial seizures, not intractable, without status epilepticus: Secondary | ICD-10-CM | POA: Diagnosis not present

## 2016-04-18 DIAGNOSIS — R5381 Other malaise: Secondary | ICD-10-CM

## 2016-04-18 DIAGNOSIS — I10 Essential (primary) hypertension: Secondary | ICD-10-CM | POA: Diagnosis not present

## 2016-04-18 DIAGNOSIS — E46 Unspecified protein-calorie malnutrition: Secondary | ICD-10-CM

## 2016-04-18 DIAGNOSIS — R1310 Dysphagia, unspecified: Secondary | ICD-10-CM

## 2016-04-18 DIAGNOSIS — D320 Benign neoplasm of cerebral meninges: Secondary | ICD-10-CM | POA: Diagnosis not present

## 2016-04-18 DIAGNOSIS — G2581 Restless legs syndrome: Secondary | ICD-10-CM | POA: Diagnosis not present

## 2016-04-18 DIAGNOSIS — K631 Perforation of intestine (nontraumatic): Secondary | ICD-10-CM | POA: Diagnosis not present

## 2016-04-18 DIAGNOSIS — N400 Enlarged prostate without lower urinary tract symptoms: Secondary | ICD-10-CM

## 2016-04-18 DIAGNOSIS — D72829 Elevated white blood cell count, unspecified: Secondary | ICD-10-CM | POA: Diagnosis not present

## 2016-04-18 DIAGNOSIS — J95851 Ventilator associated pneumonia: Secondary | ICD-10-CM | POA: Diagnosis not present

## 2016-04-18 DIAGNOSIS — D62 Acute posthemorrhagic anemia: Secondary | ICD-10-CM

## 2016-04-18 MED ORDER — LAMOTRIGINE 100 MG PO TABS: 100.0000 mg | ORAL_TABLET | Freq: Two times a day (BID) | ORAL | 0 refills | Status: DC

## 2016-04-18 NOTE — Progress Notes (Signed)
LOCATION: [name]  PCP:  Melinda Crutch, MD   Code Status: Full Code  Goals of care: Advanced Directive information Advanced Directives 04/04/2016  Does patient have an advance directive? No  Would patient like information on creating an advanced directive? No - patient declined information  Pre-existing out of facility DNR order (yellow form or pink MOST form) -       Extended Emergency Contact Information Primary Emergency Contact: Dant,[name] Address: [address]          [place], [place] [address] [place] Phone: [phone] Mobile Phone: [phone] Relation: Spouse   Allergies  Allergen Reactions  . Asa [Aspirin] Nausea Only  . Other Itching and Rash    Nuts  . Vicodin [Hydrocodone-Acetaminophen] Nausea Only    Chief Complaint  Patient presents with  . New Admit To SNF    New Admission     HPI:  Patient is a 82 y.o. male seen today for short term rehabilitation post hospital admission from 04/04/16-04/15/16 with perforated gastric ulcer of posterior wall of lesser sac. He underwent surgical repair of this. He then had ventilation dependent respiratory failure with pneumonia and was treated with antibiotics. He had septic and hypovolemic shock with AKI and ABLA, he required iv fluids and prbc transfusion. He has PMH of meningioma s/p surgical resection and radiation therapy in past and has gait instability from it. He is now on tapering course of prednisone for his meningioma and on dysphagia diet. He is seen in his room today with his wife at bedside.   Review of Systems:  Constitutional: Negative for fever, chills and diaphoresis. Feels weak and tired. HENT: Negative for headache, congestion, nasal discharge. Has dysphagia Eyes: Negative for blurred vision, double vision and discharge.  Respiratory: Negative for shortness of breath and wheezing. Positive for cough with clear phlegm.  Cardiovascular: Negative for chest pain,  palpitations, leg swelling.  Gastrointestinal: Negative for heartburn, nausea, vomiting. Last bowel movement was yesterday. Has abdominal wall soreness. Genitourinary: Negative for dysuria and flank pain.  Musculoskeletal: Negative for back pain, fall in the facility.  Skin: Negative for itching, rash. has clear drainage from peg tube site area.  Neurological: Negative for dizziness. Psychiatric/Behavioral: Negative for depression.   Past Medical History:  Diagnosis Date  . Anemia   . Aortic insufficiency 08/11/2015  . Basal cell cancer   . Brain tumor ([place])   . GERD (gastroesophageal reflux disease)   . Herniated disc    lumbar spine  . Hiatal hernia   . History of radiation therapy    back approximately 2010 prostate radiation under care of Dr. Valere Dross   . Hyperlipidemia   . Hypertension   . Meningioma ([place])    brain  . Nocturia   . Occult blood in stools   . OSA (obstructive sleep apnea) 10/28/2014   Moderate with AHI 21/hr  . Prostate cancer ([place])   . Restless leg   . Seizures ([place])    Past Surgical History:  Procedure Laterality Date  . APPLICATION OF CRANIAL NAVIGATION N/A 02/17/2015   Procedure: APPLICATION OF CRANIAL NAVIGATION;  Surgeon: Consuella Lose, MD;  Location: [facility];  Service: Neurosurgery;  Laterality: N/A;  APPLICATION OF CRANIAL NAVIGATION  . Bilateral cateract surgery    . COLONOSCOPY     colon polyp removed  . CRANIOTOMY Right 09/27/2013   Procedure: RIGHT FRONTAL CRANIOTOMY FOR TUMOR RESECTION ;  Surgeon: Consuella Lose, MD;  Location: [facility];  Service: Neurosurgery;  Laterality: Right;  . CRANIOTOMY Right 02/17/2015   Procedure: Right frontal parietal craniotomy for resection of meningioma with brainlab;  Surgeon: Consuella Lose, MD;  Location: [facility];  Service: Neurosurgery;  Laterality: Right;  Right frontal parietal craniotomy for resection of meningioma with brainlab  . JOINT REPLACEMENT     right knee replacement  .  LAPAROTOMY N/A 04/04/2016   Procedure: EXPLORATORY LAPAROTOMY, REPAIR OF GASTRIC PERFORATION WITH OMENTAL PATCH, PLACEMENT OF GASTROSTOMY TUBE;  Surgeon: Autumn Messing III, MD;  Location: [facility];  Service: General;  Laterality: N/A;  . PROSTATE BIOPSY    . TONSILLECTOMY     Agae 6-7  . VASECTOMY     Social History:   reports that he has never smoked. He quit smokeless tobacco use about 42 years ago. His smokeless tobacco use included Chew. He reports that he does not drink alcohol or use drugs.  Family History  Problem Relation Age of Onset  . Parkinsonism Mother   . Dementia Father     Medications:   Medication List       Accurate as of 04/18/16  1:51 PM. Always use your most recent med list.          acetaminophen 325 MG tablet Commonly known as:  TYLENOL Take 2 tablets (650 mg total) by mouth every 4 (four) hours as needed.   albuterol (2.5 MG/3ML) 0.083% nebulizer solution Commonly known as:  PROVENTIL Take 3 mLs (2.5 mg total) by nebulization every 4 (four) hours as needed for wheezing or shortness of breath.   b complex vitamins tablet Take 1 tablet by mouth daily.   dexamethasone 4 MG tablet Commonly known as:  DECADRON Take 4 mg by mouth daily. Stop date 04/22/16   dexamethasone 2 MG tablet Commonly known as:  DECADRON Take 2 mg by mouth daily. Stop date 04/29/16   dexamethasone 1 MG tablet Commonly known as:  DECADRON Take 1 mg by mouth daily. Stop date 05/06/16   dexamethasone 0.5 MG tablet Commonly known as:  DECADRON Take 0.5 mg by mouth daily. Stop date 05/13/16   famotidine 20 MG tablet Commonly known as:  PEPCID Take 1 tablet (20 mg total) by mouth 2 (two) times daily.   ferrous sulfate 325 (65 FE) MG tablet Take 1,300 mg by mouth every morning.   irbesartan 75 MG tablet Commonly known as:  AVAPRO Take 1 tablet (75 mg total) by mouth daily.   lamoTRIgine 100 MG tablet Commonly known as:  LAMICTAL Take 1 tablet (100 mg total) by mouth 2 (two) times  daily.   levETIRAcetam 500 MG tablet Commonly known as:  KEPPRA Take 1 & 1/2 tablets twice a day   multivitamin-lutein Caps capsule Take 1 capsule by mouth daily.   ondansetron 4 MG disintegrating tablet Commonly known as:  ZOFRAN-ODT Take 1 tablet (4 mg total) by mouth every 6 (six) hours as needed for nausea.   rOPINIRole 3 MG tablet Commonly known as:  REQUIP Take 1.5 mg by mouth 2 (two) times daily.   solifenacin 5 MG tablet Commonly known as:  VESICARE Take 1 tablet (5 mg total) by mouth daily.   tamsulosin 0.4 MG Caps capsule Commonly known as:  FLOMAX Take 1 capsule (0.4 mg total) by mouth daily after supper.   vitamin B-12 100 MCG tablet Commonly known as:  CYANOCOBALAMIN Take 100 mcg by mouth daily.       Immunizations: Immunization History  Administered Date(s) Administered  . Influenza Split  06/19/2014  . PPD Test 04/15/2016     Physical Exam:  Vitals:   04/18/16 1339  BP: 136/80  Pulse: 84  Resp: 20  Temp: 98 F (36.7 C)  TempSrc: Oral  SpO2: 94%  Weight: 216 lb (98 kg)  Height: 5\' 6"  (1.676 m)   Body mass index is 34.86 kg/m.  General- elderly obese but frail male, in no acute distress Head- normocephalic, atraumatic Nose- no maxillary or frontal sinus tenderness, no nasal discharge Throat- moist mucus membrane Eyes- PERRLA, EOMI, no pallor, no icterus, no discharge Neck- no cervical lymphadenopathy Cardiovascular- normal s1,s2, no murmur Respiratory- bilateral clear to auscultation, no wheeze, no rhonchi, no crackles, no use of accessory muscles Abdomen- bowel sounds present, soft, non tender Musculoskeletal- able to move all 4 extremities, LLE weakness most prominent Neurological- alert and oriented to person, place and time Skin- warm and dry, abdominal wall incision with wound vac in place. Peg tube site with sutures and has clear drainage. Mild erythema around peg tube site noted and in groin area Psychiatry- normal mood and  affect    Labs reviewed: Basic Metabolic Panel:  Recent Labs  04/05/16 0226  04/09/16 0410 04/10/16 0402 04/11/16 0310 04/12/16 0457  NA 135  < > 141 141 138 136  K 3.8  < > 2.8* 3.0* 3.6 3.8  CL 104  < > 108 108 106 106  CO2 21*  < > 28 26 26 26   GLUCOSE 163*  < > 82 94 88 92  BUN 22*  < > 24* 24* 19 15  CREATININE 1.02  < > 0.96 0.89 0.82 0.79  CALCIUM 7.9*  < > 7.9* 7.9* 7.9* 7.9*  MG 1.6*  --  2.2 2.2 2.2  --   PHOS 4.6  --   --   --   --   --   < > = values in this interval not displayed. Liver Function Tests:  Recent Labs  04/05/16 0226 04/10/16 0402 04/11/16 0310  AST 42* 43* 41  ALT 37 35 46  ALKPHOS 64 56 57  BILITOT 0.7 1.3* 0.8  PROT 4.7* 4.7* 5.1*  ALBUMIN 2.6* 2.1* 2.2*    Recent Labs  04/04/16 1956  LIPASE 19   No results for input(s): AMMONIA in the last 8760 hours. CBC:  Recent Labs  12/31/15 2048  03/28/16 1017  04/05/16 0226  04/09/16 0410 04/10/16 0402 04/11/16 1330  WBC 8.7  < > 8.1  < > 37.0*  < > 10.5 10.5 14.0*  NEUTROABS 6.4  --  7.2*  --  35.2*  --   --   --   --   HGB 11.9*  < > 10.9*  < > 14.0  < > 10.5* 10.9* 12.4*  HCT 36.5*  < > 34.6*  < > 45.3  < > 33.7* 35.7* 39.2  MCV 93.8  < > 85.0  < > 90.6  < > 89.4 89.9 89.5  PLT 277  < > 591*  < > 279  < > 180 176 179  < > = values in this interval not displayed. Cardiac Enzymes:  Recent Labs  04/05/16 0300 04/05/16 0550  TROPONINI 0.04* 0.05*   BNP: Invalid input(s): POCBNP CBG:  Recent Labs  04/05/16 0348  GLUCAP 165*    Radiological Exams: Mr Jeri Cos [id number] Contrast  Addendum Date: 03/21/2016   ADDENDUM REPORT: 03/21/2016 15:29 ADDENDUM: Study discussed by telephone with Dr. Rodman Key MANNING on 03/21/2016 at 1512 hours. Electronically Signed  By: Genevie Ann M.D.   On: 03/21/2016 15:29  Result Date: 03/21/2016 CLINICAL DATA:  82 year old male with right cerebral convexity grade 2 meningioma status post resection x2 and radiotherapy x2, most recently 54 Gy completed in  November 2016. Left lower extremity weakness and numbness. Unable to walk. Restaging. Subsequent encounter. EXAM: MRI HEAD WITHOUT AND WITH CONTRAST TECHNIQUE: Multiplanar, multiecho pulse sequences of the brain and surrounding structures were obtained without and with intravenous contrast. CONTRAST:  52mL MULTIHANCE GADOBENATE DIMEGLUMINE 529 MG/ML IV SOLN COMPARISON:  01/01/2016 and earlier. FINDINGS: Postoperative changes re- identified with extensive chronic en plaque meningioma over the right cerebral convexity. Today axial images differ mildly in angulation, but overall the residual nodular enhancement appears mildly progressed since April. This is most noticeable in the posterior nodular component described previously to which attention was directed. See series 11, image 93, and compare to series 11, image 38 previously. Roughly that component has increased from 12 mm thickness to about 17 mm. The anterior most nodular component has a more stable appearance. No completely new areas of enhancement are identified. Associated progression of vasogenic edema in the posterior right hemisphere, with increased edema in the inferior right parietal lobe and new edema in the superior right occipital lobe corresponding to the progressed posterior component. The posterior right frontal lobe and parietal lobe T2 and FLAIR hyperintensity otherwise is stable. The postoperative extra-axial fluid collection is stable measuring up to 9 mm in thickness. Chronic but increased occlusion of the superior sagittal sinus as demonstrated on series 11, image 74 (versus series 11, image 33 previously) and image 63 (versus image 29 previously). Other Major intracranial vascular flow voids are stable, with chronic occlusion of the more cephalad superior sagittal sinus as before. The left transverse and sigmoid sinuses are dominant and patent. Apparent new posterior left hemisphere extra-axial collection might instead reflect interval dural  venous engorgement (series 7 images 12 through 19). Otherwise no acute or interval intracranial hemorrhage. Stable ventricle size and configuration. Patent basilar cisterns. No restricted diffusion or evidence of acute infarction. Negative pituitary, cervicomedullary junction visualized cervical spine. Stable signal in the left hemisphere, brainstem, and cerebellum. Stable bone marrow signal. However, coronal T2 weighted images suggest a new T2 hyperintense deep scalp fluid collection since April best seen on series 10, image 27 (compared is series 10, image 17 previously). Other scalp soft tissues appear stable. Stable orbits soft tissues. Increased paranasal sinus mucosal thickening. Trace mastoid fluid is stable. IMPRESSION: 1. 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 New right occipital lobe vasogenic edema. 2. Evidence of new small left posterior convexity subdural hematoma or collection as well as bilateral deep scalp soft tissue fluid collections - or perhaps alternatively these relate to interval venous congestion from #1. 3. No new or increasing intracranial mass effect. Stable chronic right convexity postoperative collection. Electronically Signed: By: Genevie Ann M.D. On: 03/21/2016 13:54   Ct Abdomen Pelvis W Contrast  Result Date: 04/04/2016 CLINICAL DATA:  Abdominal and back pain EXAM: CT ABDOMEN AND PELVIS WITH CONTRAST TECHNIQUE: Multidetector CT imaging of the abdomen and pelvis was performed using the standard protocol following bolus administration of intravenous contrast. CONTRAST:  111mL ISOVUE-300 IOPAMIDOL (ISOVUE-300) INJECTION 61% COMPARISON:  09/11/2015 FINDINGS: Lower chest and abdominal wall: There is a chronic large hiatal hernia with intrathoracic stomach. The GE junction  is above the  hiatus with chronic organoaxial volvulus. There is extraluminal gas, fat inflammation, and fluid around the stomach (which is nearly entirely visualized on the delayed phase). The stomach is not overdistended to suggest obstruction. No necrotic segment of bowel or pneumatosis is seen. Gastric vessels normally opacify within the hernia sac. Umbilical hernia containing nonobstructed bowel. Hepatobiliary: No focal liver abnormality.Cholelithiasis. No evidence of biliary obstruction or inflammation. Pancreas: Unremarkable. Spleen: Unremarkable. Adrenals/Urinary Tract: Negative adrenals. No hydronephrosis or stone. Unremarkable bladder. Stomach/Bowel: Bowel findings described above. Colonic diverticulosis. Reproductive:Fiducial markers in the central right aspect of the prostate gland which has a stable CT appearance. No pelvic adenopathy. Vascular/Lymphatic: Extensive atherosclerotic calcification. No acute vascular abnormality. No mass or adenopathy. Musculoskeletal: Advanced lumbar facet arthropathy with L4-5 and L5-S1 anterolisthesis. Multilevel disc degeneration and spondylosis. Critical Value/emergent results were called by telephone at the time of interpretation on 04/04/2016 at 10:03 pm to Dr. Virgel Manifold , who verbally acknowledged these results. IMPRESSION: 1. Bowel perforation, presumably gastric, with extraluminal gas, fluid, and inflammation in a hiatal hernia sac. There is chronic intrathoracic stomach herniation with organoaxial volvulus. Despite the volvulus, the stomach is not obstructed or distended. 2. Bowel containing umbilical hernia without related obstruction. 3. Cholelithiasis. Electronically Signed   By: Monte Fantasia M.D.   On: 04/04/2016 22:06   Dg Chest Port 1 View  Result Date: 04/08/2016 CLINICAL DATA:  Increased wheezing and shortness of breath EXAM: PORTABLE CHEST 1 VIEW COMPARISON:  04/06/2016 FINDINGS: Chronic cardiopericardial enlargement. Stable positioning of central line with  tip over the SVC. Improved lung volumes despite interval extubation. Persistent bibasilar opacity that is likely atelectasis. The left diaphragm is now visible. No effusion or pneumothorax. IMPRESSION: Improved lung volumes and decreased atelectasis after extubation. Electronically Signed   By: Monte Fantasia M.D.   On: 04/08/2016 05:52   Dg Chest Port 1 View  Result Date: 04/06/2016 CLINICAL DATA:  Intubation. EXAM: PORTABLE CHEST 1 VIEW COMPARISON:  04/05/2016. FINDINGS: Endotracheal tube 1.8 cm above the carina. Proximal repositioning of approximately 1 -2 cm should be considered. Right IJ line stable position. Heart size stable. Low lung volumes with persist bibasilar atelectasis and/or infiltrates. Small bilateral pleural effusions cannot be excluded. No pneumothorax. IMPRESSION: 1. Endotracheal tube 1.8 cm above the carina. Proximal repositioning of approximately 1-2 cm should be considered . Right IJ line in stable position. 2. Low lung volumes with persistent bibasilar atelectasis and/or infiltrates. Small bilateral pleural effusions cannot be excluded . Electronically Signed   By: Marcello Moores  Register   On: 04/06/2016 06:46   Dg Chest Port 1 View  Result Date: 04/05/2016 CLINICAL DATA:  Postop.  Endotracheal tube. EXAM: PORTABLE CHEST 1 VIEW COMPARISON:  12/31/2015 FINDINGS: Low volume chest with streaky opacity greater on the left. Interstitial coarsening. Stable heart size. Central line on the right with tip at the SVC level. No pneumothorax. IMPRESSION: 1. Endotracheal tube and right IJ line in good position. No pneumothorax. 2. Postoperative atelectasis and pulmonary venous congestion. Electronically Signed   By: Monte Fantasia M.D.   On: 04/05/2016 03:26   Dg Swallowing Func-speech Pathology  Result Date: 04/13/2016 Objective Swallowing Evaluation: Type of Study: MBS-Modified Barium Swallow Study Patient Details Name: DERRAN REI MRN: [id number] Date of Birth: [birth year] Today's Date: 04/13/2016  Time: SLP Start Time (ACUTE ONLY): 1430-SLP Stop Time (ACUTE ONLY): 1500 SLP Time Calculation (min) (ACUTE ONLY): 30 min Past Medical History: Past Medical History: Diagnosis Date . Anemia  . Aortic insufficiency 08/11/2015 . Basal  cell cancer  . Brain tumor ([place])  . GERD (gastroesophageal reflux disease)  . Herniated disc   lumbar spine . Hiatal hernia  . History of radiation therapy   back approximately 2010 prostate radiation under care of Dr. Valere Dross  . Hyperlipidemia  . Hypertension  . Meningioma ([place])   brain . Nocturia  . Occult blood in stools  . OSA (obstructive sleep apnea) 10/28/2014  Moderate with AHI 21/hr . Prostate cancer ([place])  . Restless leg  . Seizures ([place])  Past Surgical History: Past Surgical History: Procedure Laterality Date . APPLICATION OF CRANIAL NAVIGATION N/A [id number]  Procedure: APPLICATION OF CRANIAL NAVIGATION;  Surgeon: Consuella Lose, MD;  Location: [facility];  Service: Neurosurgery;  Laterality: N/A;  APPLICATION OF CRANIAL NAVIGATION . Bilateral cateract surgery   . COLONOSCOPY    colon polyp removed . CRANIOTOMY Right 09/27/2013  Procedure: RIGHT FRONTAL CRANIOTOMY FOR TUMOR RESECTION ;  Surgeon: Consuella Lose, MD;  Location: [place] [facility];  Service: Neurosurgery;  Laterality: Right; . CRANIOTOMY Right 02/17/2015  Procedure: Right frontal parietal craniotomy for resection of meningioma with brainlab;  Surgeon: Consuella Lose, MD;  Location: [facility];  Service: Neurosurgery;  Laterality: Right;  Right frontal parietal craniotomy for resection of meningioma with brainlab . JOINT REPLACEMENT    right knee replacement . LAPAROTOMY N/A 04/04/2016  Procedure: EXPLORATORY LAPAROTOMY, REPAIR OF GASTRIC PERFORATION WITH OMENTAL PATCH, PLACEMENT OF GASTROSTOMY TUBE;  Surgeon: Autumn Messing III, MD;  Location: [facility];  Service: General;  Laterality: N/A; . PROSTATE BIOPSY   . TONSILLECTOMY    Agae 6-7 . VASECTOMY   HPI: 82 yo male adm to [facility] with abdominal pain - pt is s/p surgery with  PEG placed 7/17 with intubation a few days.  Per wife, pt has been in ICU and has not had po until yesterday.   Pt started po intake yesteday and was coughing with liquids, therefore diet was changed to nectar liquids and SLP ordered.   Diet was advanced to regular/thin today.  Pt PMH + for meningioma s/p XRT and surgery, anemia, seizures.  Pt CXR shows atelectasis.   Subjective: pt seen in radiology for MBS Assessment / Plan / Recommendation CHL IP CLINICAL IMPRESSIONS 04/13/2016 Therapy Diagnosis Mild oral phase dysphagia;Mild pharyngeal phase dysphagia Clinical Impression Pt presents with mild oropharyngeal dysphagia, characterized by poor bolus formation of puree and solid with posterior spillage to the vallecula, and right anterior leakage of liquids. Swallow reflex was delayed, with trigger at the vallecula on the initial swallow. Poor airway closure resulted in flash penetration of thin liquids during the initial swallow. Nectar thick liquid prevented penetration of the initial swallow, however, Residue of thin and nectar thick liquid on tongue base and vallecula was noted to spill to the pyriform prior to trigger of (cued) dry swallow.  Small boluses and immediate second swallow prevented penetration of nectar thick liquids. Recommend dys 3 solids with chopped meats and nectar thick liquids, SMALL bites and sips at a SLOW rate, with 2 swallows per bite/sip. Meds one at a time with puree. RN was notified of results and recommendations, and safe swallow precautions were posted at [facility]. ST will follow for education and assessment of diet tolerance. Recommend repeat MBS prior to advancing liquids, given lack of reflexive response to penetration on this study. Impact on safety and function Risk for inadequate nutrition/hydration;Moderate aspiration risk;Mild aspiration risk   CHL IP TREATMENT RECOMMENDATION 04/13/2016 Treatment Recommendations Therapy as outlined in treatment plan below  Prognosis 04/13/2016  Prognosis for Safe Diet Advancement Guarded Barriers to Reach Goals Motivation CHL IP DIET RECOMMENDATION 04/13/2016 SLP Diet Recommendations Dysphagia 3 (Mech soft) solids;Nectar thick liquid Liquid Administration via No straw;Cup Medication Administration Whole meds with puree Compensations Slow rate;Small sips/bites;Clear throat intermittently;Other (Comment);Minimize environmental distractions;Multiple dry swallows after each bite/sip Postural Changes Seated upright at 90 degrees   CHL IP OTHER RECOMMENDATIONS 04/13/2016 Recommended Consults -- Oral Care Recommendations Oral care BID Other Recommendations Order thickener from pharmacy;Prohibited food (jello, ice cream, thin soups);Remove water pitcher   CHL IP FOLLOW UP RECOMMENDATIONS 04/12/2016 Follow up Recommendations To be determined   CHL IP FREQUENCY AND DURATION 04/13/2016 Speech Therapy Frequency (ACUTE ONLY) min 2x/week Treatment Duration 1 week    CHL IP ORAL PHASE 04/13/2016 Oral Phase Impaired Oral - Nectar Cup Right anterior bolus loss Oral - Thin Cup Right anterior bolus loss Oral - Puree Reduced posterior propulsion;Delayed oral transit;Premature spillage Oral - Multi-Consistency Premature spillage;Delayed oral transit;Reduced posterior propulsion  CHL IP PHARYNGEAL PHASE 04/13/2016 Pharyngeal Phase Impaired Pharyngeal- Nectar Cup Delayed swallow initiation-vallecula;Reduced pharyngeal peristalsis;Reduced anterior laryngeal mobility;Reduced laryngeal elevation;Reduced airway/laryngeal closure;Reduced tongue base retraction;Penetration/Aspiration during swallow;Pharyngeal residue - valleculae;Delayed swallow initiation-pyriform sinuses Pharyngeal Material enters airway, remains ABOVE vocal cords then ejected out Pharyngeal- Thin Cup Delayed swallow initiation-vallecula;Delayed swallow initiation-pyriform sinuses;Reduced pharyngeal peristalsis;Reduced anterior laryngeal mobility;Reduced laryngeal elevation;Reduced airway/laryngeal closure;Pharyngeal  residue - valleculae;Reduced tongue base retraction;Penetration/Aspiration during swallow Pharyngeal Material enters airway, remains ABOVE vocal cords then ejected out Pharyngeal- Puree Delayed swallow initiation-vallecula;Reduced tongue base retraction Pharyngeal- Multi-consistency Delayed swallow initiation-vallecula;Reduced tongue base retraction  CHL IP CERVICAL ESOPHAGEAL PHASE 04/13/2016 Cervical Esophageal Phase [facility] [place], [facility], [place] Shonna Chock 04/13/2016, 3:19 PM               Assessment/Plan  Physical deconditioning Will have him work with physical therapy and occupational therapy team to help with gait training and muscle strengthening exercises.fall precautions. Skin care. Encourage to be out of bed.   Perforated gastric ulcer S/p surgical repair. Continue wound care with wound vac. Follow up with his surgeon. Continue tylenol 650 mg q4h prn pain. Continue famotidine  Gastric ulcer Continue famotidine and monitor  Blood loss anemia S/p 2 u prbc transfusion in hospital. Check cbc. Continue feso4 1300 mg daily  Leukocytosis Afebrile. Monitor wbc and temp curve  Protein calorie malnutrition Get RD consult. Continue feeding supplement and monitor weekly weight  Ventilator associated pneumonia Will need pulmonary toileting and aspiration precautions. Continue albuterol and change to qid for now. Has completed his antibiotics. Add incentive spirometer  Meningioma Continue his slow taper of decadron and make appointment with his neurosurgeon per family request. Continue lamictal and keppra for seizure prevention  Seizure disorder Remains seizure free in the facility. Continue lamictal and keppra for now  Dysphagia Continue dysphagia diet and get SLP consult  RLS Continue his requip and monitor  HTN Check bp q shift, continue irbesartan 75 mg daily  BPH Continue vesicare and flomax and monitor    Goals of care: short term  rehabilitation   Labs/tests ordered: cbc, cmp 04/19/16  Family/ staff Communication: reviewed care plan with patient, his wife and nursing supervisor    Blanchie Serve, MD Internal Medicine [address], [place] [address] Cell Phone (Monday-Friday 8 am - 5 pm): [phone] On Call: [phone] and follow prompts after 5 pm and on weekends Office Phone: [phone] Office Fax: [phone]

## 2016-04-20 ENCOUNTER — Non-Acute Institutional Stay (SKILLED_NURSING_FACILITY): Payer: Medicare Other | Admitting: Family

## 2016-04-20 ENCOUNTER — Ambulatory Visit: Payer: Medicare Other | Admitting: Neurology

## 2016-04-20 DIAGNOSIS — R42 Dizziness and giddiness: Secondary | ICD-10-CM | POA: Diagnosis not present

## 2016-04-20 DIAGNOSIS — B372 Candidiasis of skin and nail: Secondary | ICD-10-CM | POA: Diagnosis not present

## 2016-04-20 DIAGNOSIS — I951 Orthostatic hypotension: Secondary | ICD-10-CM

## 2016-04-20 MED ORDER — FLUCONAZOLE 150 MG PO TABS: ORAL_TABLET | ORAL | 0 refills | Status: DC

## 2016-04-20 NOTE — Progress Notes (Signed)
Location:   [facility] [place] of Service:  SNF [phone]) Provider:  Desmund Elman FNP-C    Paul Crutch, MD  Patient Care Team: Paul Kettle, MD as PCP - General [facility] Medicine)  Extended Emergency Contact Information Primary Emergency Contact: [name],[name] Address: [place]          [place],  [id number] [name] of [place] Phone: [phone] Mobile Phone: [phone] Relation: Spouse  Code Status: Full Code  Goals of care: Advanced Directive information Advanced Directives 04/04/2016  Does patient have an advance directive? No  Would patient like information on creating an advanced directive? No - patient declined information  Pre-existing out of facility DNR order (yellow form or pink MOST form) -     Chief Complaint  Patient presents with  . Acute Visit    HPI:  Pt is a 82 y.o. male seen today at [facility] for an acute visit for evaluation of redness around the G-tube. He is seen in his room today with wife at bedside. Facility Nurse reports patient's redness around the G-tube worsening. He has used Nystatin without any relief. He states abdominal G-tube redness not itchy but tends to have burning sensation at times.Physical Therapy also reports patient continues to complain of dizziness whenever he stands and blood pressure drops to 90's/60. Patient's B/P normalizes upon lying down.    Past Medical History:  Diagnosis Date  . Anemia   . Aortic insufficiency 08/11/2015  . Basal cell cancer   . Brain tumor ([place])   . GERD (gastroesophageal reflux disease)   . Herniated disc    lumbar spine  . Hiatal hernia   . History of radiation therapy    back approximately 2010 prostate radiation under care of Dr. Valere [name]   . Hyperlipidemia   . Hypertension   . Meningioma ([place])    brain  . Nocturia   . Occult blood in stools   . OSA (obstructive sleep apnea) 10/28/2014   Moderate with AHI 21/hr  . Prostate cancer ([place])     . Restless leg   . Seizures ([place])    Past Surgical History:  Procedure Laterality Date  . APPLICATION OF CRANIAL NAVIGATION N/A 02/17/2015   Procedure: APPLICATION OF CRANIAL NAVIGATION;  Surgeon: Paul Lose, MD;  Location: [place] NEURO ORS;  Service: Neurosurgery;  Laterality: N/A;  APPLICATION OF CRANIAL NAVIGATION  . Bilateral cateract surgery    . COLONOSCOPY     colon polyp removed  . CRANIOTOMY Right 09/27/2013   Procedure: RIGHT FRONTAL CRANIOTOMY FOR TUMOR RESECTION ;  Surgeon: Paul Lose, MD;  Location: [place] NEURO ORS;  Service: Neurosurgery;  Laterality: Right;  . CRANIOTOMY Right 02/17/2015   Procedure: Right frontal parietal craniotomy for resection of meningioma with brainlab;  Surgeon: Paul Lose, MD;  Location: [place] [facility]UR[facility] ORS;  Service: Neurosurgery;  Laterality: Right;  Right frontal parietal craniotomy for resection of meningioma with brainlab  . JOINT REPLACEMENT     right knee replacement  . LAPAROTOMY N/A 04/04/2016   Procedure: EXPLORATORY LAPAROTOMY, REPAIR OF GASTRIC PERFORATION WITH OMENTAL PATCH, PLACEMENT OF GASTROSTOMY TUBE;  Surgeon: Paul Messing III, MD;  Location: [facility] ORS;  Service: General;  Laterality: N/A;  . PROSTATE BIOPSY    . TONSILLECTOMY     Agae 6-7  . VASECTOMY      Allergies  Allergen Reactions  . Asa [Aspirin] Nausea Only  . Other Itching and Rash    Nuts  .  Vicodin [Hydrocodone-Acetaminophen] Nausea Only      Medication List       Accurate as of 04/20/16  1:11 PM. Always use your most recent med list.          acetaminophen 325 MG tablet Commonly known as:  TYLENOL Take 2 tablets (650 mg total) by mouth every 4 (four) hours as needed.   albuterol (2.5 MG/3ML) 0.083% nebulizer solution Commonly known as:  PROVENTIL Take 3 mLs (2.5 mg total) by nebulization every 4 (four) hours as needed for wheezing or shortness of breath.   b complex vitamins tablet Take 1 tablet by mouth daily.   dexamethasone 4 MG tablet Commonly  known as:  DECADRON Take 4 mg by mouth daily. Stop date 04/22/16   dexamethasone 2 MG tablet Commonly known as:  DECADRON Take 2 mg by mouth daily. Stop date 04/29/16   dexamethasone 1 MG tablet Commonly known as:  DECADRON Take 1 mg by mouth daily. Stop date 05/06/16   dexamethasone 0.5 MG tablet Commonly known as:  DECADRON Take 0.5 mg by mouth daily. Stop date 05/13/16   famotidine 20 MG tablet Commonly known as:  PEPCID Take 1 tablet (20 mg total) by mouth 2 (two) times daily.   ferrous sulfate 325 (65 FE) MG tablet Take 1,300 mg by mouth every morning.   fluconazole 150 MG tablet Commonly known as:  DIFLUCAN Diflucan 200 mg Tablet X 1 dose now then 150 mg Tablet weekly on Wednesday x 3 weeks.   irbesartan 75 MG tablet Commonly known as:  AVAPRO Take 1 tablet (75 mg total) by mouth daily.   lamoTRIgine 100 MG tablet Commonly known as:  LAMICTAL Take 1 tablet (100 mg total) by mouth 2 (two) times daily.   levETIRAcetam 500 MG tablet Commonly known as:  KEPPRA Take 1 & 1/2 tablets twice a day   multivitamin-lutein Caps capsule Take 1 capsule by mouth daily.   ondansetron 4 MG disintegrating tablet Commonly known as:  ZOFRAN-ODT Take 1 tablet (4 mg total) by mouth every 6 (six) hours as needed for nausea.   rOPINIRole 3 MG tablet Commonly known as:  REQUIP Take 1.5 mg by mouth 2 (two) times daily.   solifenacin 5 MG tablet Commonly known as:  VESICARE Take 1 tablet (5 mg total) by mouth daily.   tamsulosin 0.4 MG Caps capsule Commonly known as:  FLOMAX Take 1 capsule (0.4 mg total) by mouth daily after supper.   vitamin B-12 100 MCG tablet Commonly known as:  CYANOCOBALAMIN Take 100 mcg by mouth daily.       Review of Systems  Constitutional: Negative for activity change, appetite change, chills, fatigue and fever.  HENT: Negative for congestion, rhinorrhea, sinus pressure, sneezing and sore throat.   Eyes: Negative.   Respiratory: Negative for chest  tightness, shortness of breath and wheezing.        Productive cough   Cardiovascular: Negative for chest pain, palpitations and leg swelling.  Gastrointestinal: Negative for abdominal distention, abdominal pain, constipation, diarrhea, nausea and vomiting.       G-tube   Genitourinary: Negative for dysuria, flank pain, frequency and urgency.  Musculoskeletal: Positive for gait problem.  Skin: Negative for color change, pallor and rash.       Mid Abd wound with wound Vac. G-tube site worsening redness   Neurological: Positive for dizziness. Negative for seizures, syncope and light-headedness.  Psychiatric/Behavioral: Negative for agitation, confusion, hallucinations and sleep disturbance. The patient is not nervous/anxious.  Immunization History  Administered Date(s) Administered  . Influenza Split 06/19/2014  . PPD Test 04/15/2016   Pertinent  Health Maintenance Due  Topic Date Due  . PNA vac Low Risk Adult (1 of 2 - PCV13) 11/22/1997  . INFLUENZA VACCINE  06/19/2016 (Originally 04/19/2016)   Fall Risk  03/28/2016 07/17/2015 07/02/2015 06/26/2015 08/25/2014  Falls in the past year? Yes No No No No  Number falls in past yr: 2 or more - - - -  Injury with Fall? Yes - - - -  Risk Factor Category  High Fall Risk - - - -  Risk for fall due to : History of fall(s);Impaired balance/gait;Impaired mobility - - - Impaired balance/gait;Impaired mobility  Follow up Education provided - - - -   Functional Status Survey:    Vitals:   04/20/16 1240  BP: 124/81  Pulse: 79  Resp: 18  Temp: 98.1 F (36.7 C)  SpO2: 97%  Weight: 216 lb (98 kg)  Height: 5\' 6"  (1.676 m)   Body mass index is 34.86 kg/m. Physical Exam  Constitutional: He is oriented to person, place, and time. He appears well-developed and well-nourished. No distress.  HENT:  Head: Normocephalic.  Mouth/Throat: Oropharynx is clear and moist. No oropharyngeal exudate.  Eyes: Conjunctivae and EOM are normal. Pupils are  equal, round, and reactive to light. Right eye exhibits no discharge. Left eye exhibits no discharge. No scleral icterus.  Neck: Normal range of motion. No JVD present. No thyromegaly present.  Cardiovascular: Normal rate, regular rhythm and intact distal pulses.  Exam reveals no gallop and no friction rub.   No murmur heard. Pulmonary/Chest: Effort normal and breath sounds normal. No respiratory distress. He has no wheezes. He has no rales.  Abdominal: Soft. Bowel sounds are normal. He exhibits no distension. There is no tenderness. There is no rebound and no guarding.  G-tube   Musculoskeletal: Normal range of motion. He exhibits no edema, tenderness or deformity.  Lymphadenopathy:    He has no cervical adenopathy.  Neurological: He is oriented to person, place, and time.  Skin: Skin is warm and dry.  Mid abd wound vac in place. G-tube surrounding skin beefy redness noted.   Psychiatric: He has a normal mood and affect.    Labs reviewed:  Recent Labs  04/05/16 0226  04/09/16 0410 04/10/16 0402 04/11/16 0310 04/12/16 0457  NA 135  < > 141 141 138 136  K 3.8  < > 2.8* 3.0* 3.6 3.8  CL 104  < > 108 108 106 106  CO2 21*  < > 28 26 26 26   GLUCOSE 163*  < > 82 94 88 92  BUN 22*  < > 24* 24* 19 15  CREATININE 1.02  < > 0.96 0.89 0.82 0.79  CALCIUM 7.9*  < > 7.9* 7.9* 7.9* 7.9*  MG 1.6*  --  2.2 2.2 2.2  --   PHOS 4.6  --   --   --   --   --   < > = values in this interval not displayed.  Recent Labs  04/05/16 0226 04/10/16 0402 04/11/16 0310  AST 42* 43* 41  ALT 37 35 46  ALKPHOS 64 56 57  BILITOT 0.7 1.3* 0.8  PROT 4.7* 4.7* 5.1*  ALBUMIN 2.6* 2.1* 2.2*    Recent Labs  12/31/15 2048  03/28/16 1017  04/05/16 0226  04/09/16 0410 04/10/16 0402 04/11/16 1330  WBC 8.7  < > 8.1  < > 37.0*  < >  10.5 10.5 14.0*  NEUTROABS 6.4  --  7.2*  --  35.2*  --   --   --   --   HGB 11.9*  < > 10.9*  < > 14.0  < > 10.5* 10.9* 12.4*  HCT 36.5*  < > 34.6*  < > 45.3  < > 33.7* 35.7*  39.2  MCV 93.8  < > 85.0  < > 90.6  < > 89.4 89.9 89.5  PLT 277  < > 591*  < > 279  < > 180 176 179  < > = values in this interval not displayed. Lab Results  Component Value Date   TSH 1.610 07/31/2014   No results found for: HGBA1C Lab Results  Component Value Date   TRIG 101 04/05/2016    Significant Diagnostic Results in last 30 days:  Mr Jeri Cos [id number] Contrast  Addendum Date: 03/21/2016   ADDENDUM REPORT: 03/21/2016 15:29 ADDENDUM: Study discussed by telephone with Dr. Rodman Key MANNING on 03/21/2016 at 1512 hours. Electronically Signed   By: Genevie Ann M.D.   On: 03/21/2016 15:29  Result Date: 03/21/2016 CLINICAL DATA:  82 year old male with right cerebral convexity grade 2 meningioma status post resection x2 and radiotherapy x2, most recently 54 Gy completed in November 2016. Left lower extremity weakness and numbness. Unable to walk. Restaging. Subsequent encounter. EXAM: MRI HEAD WITHOUT AND WITH CONTRAST TECHNIQUE: Multiplanar, multiecho pulse sequences of the brain and surrounding structures were obtained without and with intravenous contrast. CONTRAST:  94mL MULTIHANCE GADOBENATE DIMEGLUMINE 529 MG/ML IV SOLN COMPARISON:  01/01/2016 and earlier. FINDINGS: Postoperative changes re- identified with extensive chronic en plaque meningioma over the right cerebral convexity. Today axial images differ mildly in angulation, but overall the residual nodular enhancement appears mildly progressed since April. This is most noticeable in the posterior nodular component described previously to which attention was directed. See series 11, image 93, and compare to series 11, image 38 previously. Roughly that component has increased from 12 mm thickness to about 17 mm. The anterior most nodular component has a more stable appearance. No completely new areas of enhancement are identified. Associated progression of vasogenic edema in the posterior right hemisphere, with increased edema in the inferior right parietal  lobe and new edema in the superior right occipital lobe corresponding to the progressed posterior component. The posterior right frontal lobe and parietal lobe T2 and FLAIR hyperintensity otherwise is stable. The postoperative extra-axial fluid collection is stable measuring up to 9 mm in thickness. Chronic but increased occlusion of the superior sagittal sinus as demonstrated on series 11, image 74 (versus series 11, image 33 previously) and image 63 (versus image 29 previously). Other Major intracranial vascular flow voids are stable, with chronic occlusion of the more cephalad superior sagittal sinus as before. The left transverse and sigmoid sinuses are dominant and patent. Apparent new posterior left hemisphere extra-axial collection might instead reflect interval dural venous engorgement (series 7 images 12 through 19). Otherwise no acute or interval intracranial hemorrhage. Stable ventricle size and configuration. Patent basilar cisterns. No restricted diffusion or evidence of acute infarction. Negative pituitary, cervicomedullary junction visualized cervical spine. Stable signal in the left hemisphere, brainstem, and cerebellum. Stable bone marrow signal. However, coronal T2 weighted images suggest a new T2 hyperintense deep scalp fluid collection since April best seen on series 10, image 27 (compared is series 10, image 17 previously). Other scalp soft tissues appear stable. Stable orbits soft tissues. Increased paranasal sinus mucosal thickening. Trace mastoid fluid is  stable. IMPRESSION: 1. 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 New right occipital lobe vasogenic edema. 2. Evidence of new small left posterior convexity subdural hematoma or collection as well as bilateral deep scalp soft tissue fluid  collections - or perhaps alternatively these relate to interval venous congestion from #1. 3. No new or increasing intracranial mass effect. Stable chronic right convexity postoperative collection. Electronically Signed: By: Genevie Ann M.D. On: 03/21/2016 13:54   Ct Abdomen Pelvis W Contrast  Result Date: 04/04/2016 CLINICAL DATA:  Abdominal and back pain EXAM: CT ABDOMEN AND PELVIS WITH CONTRAST TECHNIQUE: Multidetector CT imaging of the abdomen and pelvis was performed using the standard protocol following bolus administration of intravenous contrast. CONTRAST:  193mL ISOVUE-300 IOPAMIDOL (ISOVUE-300) INJECTION 61% COMPARISON:  09/11/2015 FINDINGS: Lower chest and abdominal wall: There is a chronic large hiatal hernia with intrathoracic stomach. The GE junction is above the hiatus with chronic organoaxial volvulus. There is extraluminal gas, fat inflammation, and fluid around the stomach (which is nearly entirely visualized on the delayed phase). The stomach is not overdistended to suggest obstruction. No necrotic segment of bowel or pneumatosis is seen. Gastric vessels normally opacify within the hernia sac. Umbilical hernia containing nonobstructed bowel. Hepatobiliary: No focal liver abnormality.Cholelithiasis. No evidence of biliary obstruction or inflammation. Pancreas: Unremarkable. Spleen: Unremarkable. Adrenals/Urinary Tract: Negative adrenals. No hydronephrosis or stone. Unremarkable bladder. Stomach/Bowel: Bowel findings described above. Colonic diverticulosis. Reproductive:Fiducial markers in the central right aspect of the prostate gland which has a stable CT appearance. No pelvic adenopathy. Vascular/Lymphatic: Extensive atherosclerotic calcification. No acute vascular abnormality. No mass or adenopathy. Musculoskeletal: Advanced lumbar facet arthropathy with L4-5 and L5-S1 anterolisthesis. Multilevel disc degeneration and spondylosis. Critical Value/emergent results were called by telephone at the  time of interpretation on 04/04/2016 at 10:03 pm to Dr. Virgel Manifold , who verbally acknowledged these results. IMPRESSION: 1. Bowel perforation, presumably gastric, with extraluminal gas, fluid, and inflammation in a hiatal hernia sac. There is chronic intrathoracic stomach herniation with organoaxial volvulus. Despite the volvulus, the stomach is not obstructed or distended. 2. Bowel containing umbilical hernia without related obstruction. 3. Cholelithiasis. Electronically Signed   By: Monte Fantasia M.D.   On: 04/04/2016 22:06   Dg Chest Port 1 View  Result Date: 04/08/2016 CLINICAL DATA:  Increased wheezing and shortness of breath EXAM: PORTABLE CHEST 1 VIEW COMPARISON:  04/06/2016 FINDINGS: Chronic cardiopericardial enlargement. Stable positioning of central line with tip over the SVC. Improved lung volumes despite interval extubation. Persistent bibasilar opacity that is likely atelectasis. The left diaphragm is now visible. No effusion or pneumothorax. IMPRESSION: Improved lung volumes and decreased atelectasis after extubation. Electronically Signed   By: Monte Fantasia M.D.   On: 04/08/2016 05:52   Dg Chest Port 1 View  Result Date: 04/06/2016 CLINICAL DATA:  Intubation. EXAM: PORTABLE CHEST 1 VIEW COMPARISON:  04/05/2016. FINDINGS: Endotracheal tube 1.8 cm above the carina. Proximal repositioning of approximately 1 -2 cm should be considered. Right IJ line stable position. Heart size stable. Low lung volumes with persist bibasilar atelectasis and/or infiltrates. Small bilateral pleural effusions cannot be excluded. No pneumothorax. IMPRESSION: 1. Endotracheal tube 1.8 cm above the carina. Proximal repositioning of approximately 1-2 cm should be considered . Right IJ line in stable position. 2. Low lung volumes with persistent bibasilar atelectasis and/or infiltrates. Small bilateral pleural effusions cannot be  excluded . Electronically Signed   By: Marcello Moores  Register   On: 04/06/2016 06:46   Dg  Chest Port 1 View  Result Date: 04/05/2016 CLINICAL DATA:  Postop.  Endotracheal tube. EXAM: PORTABLE CHEST 1 VIEW COMPARISON:  12/31/2015 FINDINGS: Low volume chest with streaky opacity greater on the left. Interstitial coarsening. Stable heart size. Central line on the right with tip at the SVC level. No pneumothorax. IMPRESSION: 1. Endotracheal tube and right IJ line in good position. No pneumothorax. 2. Postoperative atelectasis and pulmonary venous congestion. Electronically Signed   By: Monte Fantasia M.D.   On: 04/05/2016 03:26   Dg Swallowing Func-speech Pathology  Result Date: 04/13/2016 Objective Swallowing Evaluation: Type of Study: MBS-Modified Barium Swallow Study Patient Details Name: REGNIALD YEISER MRN: [id number] Date of Birth: Sep 16, 1931 Today's Date: 04/13/2016 Time: SLP Start Time (ACUTE ONLY): 1430-SLP Stop Time (ACUTE ONLY): 1500 SLP Time Calculation (min) (ACUTE ONLY): 30 min Past Medical History: Past Medical History: Diagnosis Date . Anemia  . Aortic insufficiency 08/11/2015 . Basal cell cancer  . Brain tumor ([place])  . GERD (gastroesophageal reflux disease)  . Herniated disc   lumbar spine . Hiatal hernia  . History of radiation therapy   back approximately 2010 prostate radiation under care of Dr. Valere [name]  . Hyperlipidemia  . Hypertension  . Meningioma ([place])   brain . Nocturia  . Occult blood in stools  . OSA (obstructive sleep apnea) 10/28/2014  Moderate with AHI 21/hr . Prostate cancer ([place])  . Restless leg  . Seizures ([place])  Past Surgical History: Past Surgical History: Procedure Laterality Date . APPLICATION OF CRANIAL NAVIGATION N/A [id number]  Procedure: APPLICATION OF CRANIAL NAVIGATION;  Surgeon: Paul Lose, MD;  Location: [place] [facility] ORS;  Service: Neurosurgery;  Laterality: N/A;  APPLICATION OF CRANIAL NAVIGATION . Bilateral cateract surgery   . COLONOSCOPY    colon polyp removed . CRANIOTOMY Right 09/27/2013  Procedure: RIGHT FRONTAL CRANIOTOMY FOR TUMOR RESECTION ;  Surgeon:  Paul Lose, MD;  Location: [place] [facility];  Service: Neurosurgery;  Laterality: Right; . CRANIOTOMY Right 02/17/2015  Procedure: Right frontal parietal craniotomy for resection of meningioma with brainlab;  Surgeon: Paul Lose, MD;  Location: [place] [facility] OR[facility];  Service: Neurosurgery;  Laterality: Right;  Right frontal parietal craniotomy for resection of meningioma with brainlab . JOINT REPLACEMENT    right knee replacement . LAPAROTOMY N/A 04/04/2016  Procedure: EXPLORATORY LAPAROTOMY, REPAIR OF GASTRIC PERFORATION WITH OMENTAL PATCH, PLACEMENT OF GASTROSTOMY TUBE;  Surgeon: Paul Messing III, MD;  Location: [facility];  Service: General;  Laterality: N/A; . PROSTATE BIOPSY   . TONSILLECTOMY    Agae 6-7 . VASECTOMY   HPI: 82 yo male adm to [facility] with abdominal pain - pt is s/p surgery with PEG placed 7/17 with intubation a few days.  Per wife, pt has been in ICU and has not had po until yesterday.   Pt started po intake yesteday and was coughing with liquids, therefore diet was changed to nectar liquids and SLP ordered.   Diet was advanced to regular/thin today.  Pt PMH + for meningioma s/p XRT and surgery, anemia, seizures.  Pt CXR shows atelectasis.   Subjective: pt seen in radiology for MBS Assessment / Plan / Recommendation CHL IP CLINICAL IMPRESSIONS 04/13/2016 Therapy Diagnosis Mild oral phase dysphagia;Mild pharyngeal phase dysphagia Clinical Impression Pt presents with mild oropharyngeal dysphagia, characterized by poor bolus formation of puree and solid with posterior spillage to the vallecula, and right anterior leakage of liquids. Swallow reflex  was delayed, with trigger at the vallecula on the initial swallow. Poor airway closure resulted in flash penetration of thin liquids during the initial swallow. Nectar thick liquid prevented penetration of the initial swallow, however, Residue of thin and nectar thick liquid on tongue base and vallecula was noted to spill to the pyriform prior to trigger of (cued) dry  swallow.  Small boluses and immediate second swallow prevented penetration of nectar thick liquids. Recommend dys 3 solids with chopped meats and nectar thick liquids, SMALL bites and sips at a SLOW rate, with 2 swallows per bite/sip. Meds one at a time with puree. RN was notified of results and recommendations, and safe swallow precautions were posted at [facility]. ST will follow for education and assessment of diet tolerance. Recommend repeat MBS prior to advancing liquids, given lack of reflexive response to penetration on this study. Impact on safety and function Risk for inadequate nutrition/hydration;Moderate aspiration risk;Mild aspiration risk   CHL IP TREATMENT RECOMMENDATION 04/13/2016 Treatment Recommendations Therapy as outlined in treatment plan below   Prognosis 04/13/2016 Prognosis for Safe Diet Advancement Guarded Barriers to Reach Goals Motivation CHL IP DIET RECOMMENDATION 04/13/2016 SLP Diet Recommendations Dysphagia 3 (Mech soft) solids;Nectar thick liquid Liquid Administration via No straw;Cup Medication Administration Whole meds with puree Compensations Slow rate;Small sips/bites;Clear throat intermittently;Other (Comment);Minimize environmental distractions;Multiple dry swallows after each bite/sip Postural Changes Seated upright at 90 degrees   CHL IP OTHER RECOMMENDATIONS 04/13/2016 Recommended Consults -- Oral Care Recommendations Oral care BID Other Recommendations Order thickener from pharmacy;Prohibited food (jello, ice cream, thin soups);Remove water pitcher   CHL IP FOLLOW UP RECOMMENDATIONS 04/12/2016 Follow up Recommendations To be determined   CHL IP FREQUENCY AND DURATION 04/13/2016 Speech Therapy Frequency (ACUTE ONLY) min 2x/week Treatment Duration 1 week    CHL IP ORAL PHASE 04/13/2016 Oral Phase Impaired Oral - Nectar Cup Right anterior bolus loss Oral - Thin Cup Right anterior bolus loss Oral - Puree Reduced posterior propulsion;Delayed oral transit;Premature spillage Oral -  Multi-Consistency Premature spillage;Delayed oral transit;Reduced posterior propulsion  CHL IP PHARYNGEAL PHASE 04/13/2016 Pharyngeal Phase Impaired Pharyngeal- Nectar Cup Delayed swallow initiation-vallecula;Reduced pharyngeal peristalsis;Reduced anterior laryngeal mobility;Reduced laryngeal elevation;Reduced airway/laryngeal closure;Reduced tongue base retraction;Penetration/Aspiration during swallow;Pharyngeal residue - valleculae;Delayed swallow initiation-pyriform sinuses Pharyngeal Material enters airway, remains ABOVE vocal cords then ejected out Pharyngeal- Thin Cup Delayed swallow initiation-vallecula;Delayed swallow initiation-pyriform sinuses;Reduced pharyngeal peristalsis;Reduced anterior laryngeal mobility;Reduced laryngeal elevation;Reduced airway/laryngeal closure;Pharyngeal residue - valleculae;Reduced tongue base retraction;Penetration/Aspiration during swallow Pharyngeal Material enters airway, remains ABOVE vocal cords then ejected out Pharyngeal- Puree Delayed swallow initiation-vallecula;Reduced tongue base retraction Pharyngeal- Multi-consistency Delayed swallow initiation-vallecula;Reduced tongue base retraction  CHL IP CERVICAL ESOPHAGEAL PHASE 04/13/2016 Cervical Esophageal Phase [facility] Celia B. [place], [place] [facility], CCC-SLP [id number] Shonna Chock 04/13/2016, 3:19 PM               Assessment/Plan 1. Candidiasis of skin G-tube site redness worsening. Continue with Nystatin. Add diflucan 200 mg Tablet X 1 dose now then start Diflucan 150 mg tablet one by mouth weekly on Wednesday  X 3 weeks. Continue wound   2. Orthostasis  Continue to monitor B/p.Fall and safety precaution.   3. Dizziness Chronic.Possible form his Meningioma. Continue to monitor.      Family/ staff Communication: Reviewed plan of care with patient, wife and facility Nurse supervisor.   Labs/tests ordered:   None

## 2016-04-22 ENCOUNTER — Non-Acute Institutional Stay (SKILLED_NURSING_FACILITY): Payer: Medicare Other | Admitting: Family

## 2016-04-22 DIAGNOSIS — R05 Cough: Secondary | ICD-10-CM

## 2016-04-22 DIAGNOSIS — Z431 Encounter for attention to gastrostomy: Secondary | ICD-10-CM

## 2016-04-22 DIAGNOSIS — I951 Orthostatic hypotension: Secondary | ICD-10-CM

## 2016-04-22 DIAGNOSIS — K219 Gastro-esophageal reflux disease without esophagitis: Secondary | ICD-10-CM | POA: Diagnosis not present

## 2016-04-22 DIAGNOSIS — R059 Cough, unspecified: Secondary | ICD-10-CM | POA: Insufficient documentation

## 2016-04-22 NOTE — Progress Notes (Addendum)
Location:    [facility] of Service:  SNF [phone]) Provider: Taffie Eckmann FNP-C    Melinda Crutch, MD  Patient Care Team: Lona Kettle, MD as PCP - General [facility])  Extended Emergency Contact Information Primary Emergency Contact: Nadel,[name] Address: [address]          [place], [place] [address] [name] of [place] Phone: [phone] Mobile Phone: [phone] Relation: Spouse  Code Status:Full Code  Goals of care: Advanced Directive information Advanced Directives 04/04/2016  Does patient have an advance directive? No  Would patient like information on creating an advanced directive? No - patient declined information  Pre-existing out of facility DNR order (yellow form or pink MOST form) -     Chief Complaint  Patient presents with  . Acute Visit    Increased drainage from PEG tube site     HPI:  Pt is a 82 y.o. male seen today at [facility] for an acute visit for evaluation of orthostatic B/P and worsening drainage from PEG tube site. He is seen in his room today. He complains of product cough keeping him up at night. He states coughing up whitish yellow thick sputum. He denies any fever, chills or shortness of breath. Facility staff reports orthostatic B/p and dizziness whenever patient is assisted in a standing position. Also reports left abdominal PEG tube site  worsening drainage. Facility Nurse has had to change PEG tube dressing multiple times per shift.    Past Medical History:  Diagnosis Date  . Anemia   . Aortic insufficiency 08/11/2015  . Basal cell cancer   . Brain tumor ([place])   . GERD (gastroesophageal reflux disease)   . Herniated disc    lumbar spine  . Hiatal hernia   . History of radiation therapy    back approximately 2010 prostate radiation under care of Dr. Valere Dross   . Hyperlipidemia   . Hypertension   . Meningioma ([place])    brain  . Nocturia   . Occult blood in stools   . OSA  (obstructive sleep apnea) 10/28/2014   Moderate with AHI 21/hr  . Prostate cancer ([place])   . Restless leg   . Seizures ([place])    Past Surgical History:  Procedure Laterality Date  . APPLICATION OF CRANIAL NAVIGATION N/A 02/17/2015   Procedure: APPLICATION OF CRANIAL NAVIGATION;  Surgeon: Consuella Lose, MD;  Location: [place] NEURO ORS;  Service: Neurosurgery;  Laterality: N/A;  APPLICATION OF CRANIAL NAVIGATION  . Bilateral cateract surgery    . COLONOSCOPY     colon polyp removed  . CRANIOTOMY Right 09/27/2013   Procedure: RIGHT FRONTAL CRANIOTOMY FOR TUMOR RESECTION ;  Surgeon: Consuella Lose, MD;  Location: [place] NEURO ORS;  Service: Neurosurgery;  Laterality: Right;  . CRANIOTOMY Right 02/17/2015   Procedure: Right frontal parietal craniotomy for resection of meningioma with brainlab;  Surgeon: Consuella Lose, MD;  Location: [place] NEURO ORS;  Service: Neurosurgery;  Laterality: Right;  Right frontal parietal craniotomy for resection of meningioma with brainlab  . JOINT REPLACEMENT     right knee replacement  . LAPAROTOMY N/A 04/04/2016   Procedure: EXPLORATORY LAPAROTOMY, REPAIR OF GASTRIC PERFORATION WITH OMENTAL PATCH, PLACEMENT OF GASTROSTOMY TUBE;  Surgeon: Autumn Messing III, MD;  Location: [facility] ORS;  Service: General;  Laterality: N/A;  . PROSTATE BIOPSY    . TONSILLECTOMY     Agae 6-7  . VASECTOMY      Allergies  Allergen  Reactions  . Asa [Aspirin] Nausea Only  . Other Itching and Rash    Nuts  . Vicodin [Hydrocodone-Acetaminophen] Nausea Only      Medication List       Accurate as of 04/22/16  1:24 PM. Always use your most recent med list.          acetaminophen 325 MG tablet Commonly known as:  TYLENOL Take 2 tablets (650 mg total) by mouth every 4 (four) hours as needed.   albuterol (2.5 MG/3ML) 0.083% nebulizer solution Commonly known as:  PROVENTIL Take 3 mLs (2.5 mg total) by nebulization every 4 (four) hours as needed for wheezing or shortness of breath.   b complex  vitamins tablet Take 1 tablet by mouth daily.   dexamethasone 4 MG tablet Commonly known as:  DECADRON Take 4 mg by mouth daily. Stop date 04/22/16   dexamethasone 2 MG tablet Commonly known as:  DECADRON Take 2 mg by mouth daily. Stop date 04/29/16   dexamethasone 1 MG tablet Commonly known as:  DECADRON Take 1 mg by mouth daily. Stop date 05/06/16   dexamethasone 0.5 MG tablet Commonly known as:  DECADRON Take 0.5 mg by mouth daily. Stop date 05/13/16   famotidine 20 MG tablet Commonly known as:  PEPCID Take 1 tablet (20 mg total) by mouth 2 (two) times daily.   ferrous sulfate 325 (65 FE) MG tablet Take 1,300 mg by mouth every morning.   fluconazole 150 MG tablet Commonly known as:  DIFLUCAN Diflucan 200 mg Tablet X 1 dose now then 150 mg Tablet weekly on Wednesday x 3 weeks.   lamoTRIgine 100 MG tablet Commonly known as:  LAMICTAL Take 1 tablet (100 mg total) by mouth 2 (two) times daily.   levETIRAcetam 500 MG tablet Commonly known as:  KEPPRA Take 1 & 1/2 tablets twice a day   multivitamin-lutein Caps capsule Take 1 capsule by mouth daily.   ondansetron 4 MG disintegrating tablet Commonly known as:  ZOFRAN-ODT Take 1 tablet (4 mg total) by mouth every 6 (six) hours as needed for nausea.   rOPINIRole 3 MG tablet Commonly known as:  REQUIP Take 1.5 mg by mouth 2 (two) times daily.   solifenacin 5 MG tablet Commonly known as:  VESICARE Take 1 tablet (5 mg total) by mouth daily.   tamsulosin 0.4 MG Caps capsule Commonly known as:  FLOMAX Take 1 capsule (0.4 mg total) by mouth daily after supper.   vitamin B-12 100 MCG tablet Commonly known as:  CYANOCOBALAMIN Take 100 mcg by mouth daily.       Review of Systems  Constitutional: Negative for activity change, appetite change, chills, fatigue and fever.  HENT: Negative for congestion, rhinorrhea, sinus pressure, sneezing and sore throat.   Eyes: Negative.   Respiratory: Positive for cough. Negative for  chest tightness, shortness of breath and wheezing.        Productive   Cardiovascular: Negative for chest pain, palpitations and leg swelling.       Hypotensive with standing  Gastrointestinal: Negative for abdominal distention, abdominal pain, constipation, diarrhea, nausea and vomiting.       G-tube site worsening drainage  Genitourinary: Negative for dysuria, flank pain, frequency and urgency.  Musculoskeletal: Positive for gait problem.  Skin: Negative for color change, pallor and rash.       Mid Abd wound with wound Vac. G-tube surrounding skin redness.  Neurological: Positive for dizziness. Negative for seizures, syncope and light-headedness.  Psychiatric/Behavioral: Negative for agitation, confusion,  hallucinations and sleep disturbance. The patient is not nervous/anxious.     Immunization History  Administered Date(s) Administered  . Influenza Split 06/19/2014  . PPD Test 04/15/2016   Pertinent  Health Maintenance Due  Topic Date Due  . PNA vac Low Risk Adult (1 of 2 - PCV13) 11/22/1997  . INFLUENZA VACCINE  06/19/2016 (Originally 04/19/2016)   Fall Risk  03/28/2016 07/17/2015 07/02/2015 06/26/2015 08/25/2014  Falls in the past year? Yes No No No No  Number falls in past yr: 2 or more - - - -  Injury with Fall? Yes - - - -  Risk Factor Category  High Fall Risk - - - -  Risk for fall due to : History of fall(s);Impaired balance/gait;Impaired mobility - - - Impaired balance/gait;Impaired mobility  Follow up Education provided - - - -   Functional Status Survey:    Vitals:   04/22/16 1159  BP: 133/81  Pulse: 88  Resp: 16  Temp: 98.8 F (37.1 C)  SpO2: 98%  Weight: 216 lb (98 kg)  Height: 5\' 6"  (1.676 m)   Body mass index is 34.86 kg/m. Physical Exam  Constitutional: He is oriented to person, place, and time. No distress.  Overweight, Elderly in no acute distress   HENT:  Head: Normocephalic.  Mouth/Throat: Oropharynx is clear and moist. No oropharyngeal exudate.    Eyes: Conjunctivae and EOM are normal. Pupils are equal, round, and reactive to light. Right eye exhibits no discharge. Left eye exhibits no discharge. No scleral icterus.  Neck: Normal range of motion. No JVD present. No thyromegaly present.  Cardiovascular: Normal rate, regular rhythm, normal heart sounds and intact distal pulses.  Exam reveals no gallop and no friction rub.   No murmur heard. Pulmonary/Chest: Effort normal and breath sounds normal. No respiratory distress. He has no wheezes. He has no rales.  Abdominal: Soft. Bowel sounds are normal. He exhibits no distension. There is no tenderness. There is no rebound and no guarding.  G-tube not in use   Musculoskeletal: Normal range of motion. He exhibits no edema, tenderness or deformity.  Lymphadenopathy:    He has no cervical adenopathy.  Neurological: He is oriented to person, place, and time.  Skin: Skin is warm and dry.  Mid abd wound vac in place. G-tube surrounding skin beefy redness much improvement. Moderate amounts of yellow-brownish drainage from PEG site noted.   Psychiatric: He has a normal mood and affect.    Labs reviewed:  Recent Labs  04/05/16 0226  04/09/16 0410 04/10/16 0402 04/11/16 0310 04/12/16 0457  NA 135  < > 141 141 138 136  K 3.8  < > 2.8* 3.0* 3.6 3.8  CL 104  < > 108 108 106 106  CO2 21*  < > 28 26 26 26   GLUCOSE 163*  < > 82 94 88 92  BUN 22*  < > 24* 24* 19 15  CREATININE 1.02  < > 0.96 0.89 0.82 0.79  CALCIUM 7.9*  < > 7.9* 7.9* 7.9* 7.9*  MG 1.6*  --  2.2 2.2 2.2  --   PHOS 4.6  --   --   --   --   --   < > = values in this interval not displayed.  Recent Labs  04/05/16 0226 04/10/16 0402 04/11/16 0310  AST 42* 43* 41  ALT 37 35 46  ALKPHOS 64 56 57  BILITOT 0.7 1.3* 0.8  PROT 4.7* 4.7* 5.1*  ALBUMIN 2.6* 2.1* 2.2*  Recent Labs  12/31/15 2048  03/28/16 1017  04/05/16 0226  04/09/16 0410 04/10/16 0402 04/11/16 1330  WBC 8.7  < > 8.1  < > 37.0*  < > 10.5 10.5 14.0*   NEUTROABS 6.4  --  7.2*  --  35.2*  --   --   --   --   HGB 11.9*  < > 10.9*  < > 14.0  < > 10.5* 10.9* 12.4*  HCT 36.5*  < > 34.6*  < > 45.3  < > 33.7* 35.7* 39.2  MCV 93.8  < > 85.0  < > 90.6  < > 89.4 89.9 89.5  PLT 277  < > 591*  < > 279  < > 180 176 179  < > = values in this interval not displayed. Lab Results  Component Value Date   TSH 1.610 07/31/2014   No results found for: HGBA1C Lab Results  Component Value Date   TRIG 101 04/05/2016    Significant Diagnostic Results in last 30 days:  Ct Abdomen Pelvis W Contrast  Result Date: 04/04/2016 CLINICAL DATA:  Abdominal and back pain EXAM: CT ABDOMEN AND PELVIS WITH CONTRAST TECHNIQUE: Multidetector CT imaging of the abdomen and pelvis was performed using the standard protocol following bolus administration of intravenous contrast. CONTRAST:  146mL ISOVUE-300 IOPAMIDOL (ISOVUE-300) INJECTION 61% COMPARISON:  09/11/2015 FINDINGS: Lower chest and abdominal wall: There is a chronic large hiatal hernia with intrathoracic stomach. The GE junction is above the hiatus with chronic organoaxial volvulus. There is extraluminal gas, fat inflammation, and fluid around the stomach (which is nearly entirely visualized on the delayed phase). The stomach is not overdistended to suggest obstruction. No necrotic segment of bowel or pneumatosis is seen. Gastric vessels normally opacify within the hernia sac. Umbilical hernia containing nonobstructed bowel. Hepatobiliary: No focal liver abnormality.Cholelithiasis. No evidence of biliary obstruction or inflammation. Pancreas: Unremarkable. Spleen: Unremarkable. Adrenals/Urinary Tract: Negative adrenals. No hydronephrosis or stone. Unremarkable bladder. Stomach/Bowel: Bowel findings described above. Colonic diverticulosis. Reproductive:Fiducial markers in the central right aspect of the prostate gland which has a stable CT appearance. No pelvic adenopathy. Vascular/Lymphatic: Extensive atherosclerotic  calcification. No acute vascular abnormality. No mass or adenopathy. Musculoskeletal: Advanced lumbar facet arthropathy with L4-5 and L5-S1 anterolisthesis. Multilevel disc degeneration and spondylosis. Critical Value/emergent results were called by telephone at the time of interpretation on 04/04/2016 at 10:03 pm to Dr. Virgel Manifold , who verbally acknowledged these results. IMPRESSION: 1. Bowel perforation, presumably gastric, with extraluminal gas, fluid, and inflammation in a hiatal hernia sac. There is chronic intrathoracic stomach herniation with organoaxial volvulus. Despite the volvulus, the stomach is not obstructed or distended. 2. Bowel containing umbilical hernia without related obstruction. 3. Cholelithiasis. Electronically Signed   By: Monte Fantasia M.D.   On: 04/04/2016 22:06   Dg Chest Port 1 View  Result Date: 04/08/2016 CLINICAL DATA:  Increased wheezing and shortness of breath EXAM: PORTABLE CHEST 1 VIEW COMPARISON:  04/06/2016 FINDINGS: Chronic cardiopericardial enlargement. Stable positioning of central line with tip over the SVC. Improved lung volumes despite interval extubation. Persistent bibasilar opacity that is likely atelectasis. The left diaphragm is now visible. No effusion or pneumothorax. IMPRESSION: Improved lung volumes and decreased atelectasis after extubation. Electronically Signed   By: Monte Fantasia M.D.   On: 04/08/2016 05:52   Dg Chest Port 1 View  Result Date: 04/06/2016 CLINICAL DATA:  Intubation. EXAM: PORTABLE CHEST 1 VIEW COMPARISON:  04/05/2016. FINDINGS: Endotracheal tube 1.8 cm above the carina. Proximal repositioning of approximately  1 -2 cm should be considered. Right IJ line stable position. Heart size stable. Low lung volumes with persist bibasilar atelectasis and/or infiltrates. Small bilateral pleural effusions cannot be excluded. No pneumothorax. IMPRESSION: 1. Endotracheal tube 1.8 cm above the carina. Proximal repositioning of approximately 1-2 cm  should be considered . Right IJ line in stable position. 2. Low lung volumes with persistent bibasilar atelectasis and/or infiltrates. Small bilateral pleural effusions cannot be excluded . Electronically Signed   By: Marcello Moores  Register   On: 04/06/2016 06:46   Dg Chest Port 1 View  Result Date: 04/05/2016 CLINICAL DATA:  Postop.  Endotracheal tube. EXAM: PORTABLE CHEST 1 VIEW COMPARISON:  12/31/2015 FINDINGS: Low volume chest with streaky opacity greater on the left. Interstitial coarsening. Stable heart size. Central line on the right with tip at the SVC level. No pneumothorax. IMPRESSION: 1. Endotracheal tube and right IJ line in good position. No pneumothorax. 2. Postoperative atelectasis and pulmonary venous congestion. Electronically Signed   By: Monte Fantasia M.D.   On: 04/05/2016 03:26   Dg Swallowing Func-speech Pathology  Result Date: 04/13/2016 Objective Swallowing Evaluation: Type of Study: MBS-Modified Barium Swallow Study Patient Details Name: WINFERD COLGIN MRN: [id number] Date of Birth: [birth year] Today's Date: 04/13/2016 Time: SLP Start Time (ACUTE ONLY): 1430-SLP Stop Time (ACUTE ONLY): 1500 SLP Time Calculation (min) (ACUTE ONLY): 30 min Past Medical History: Past Medical History: Diagnosis Date . Anemia  . Aortic insufficiency 08/11/2015 . Basal cell cancer  . Brain tumor ([place])  . GERD (gastroesophageal reflux disease)  . Herniated disc   lumbar spine . Hiatal hernia  . History of radiation therapy   back approximately 2010 prostate radiation under care of Dr. Valere Dross  . Hyperlipidemia  . Hypertension  . Meningioma ([place])   brain . Nocturia  . Occult blood in stools  . OSA (obstructive sleep apnea) 10/28/2014  Moderate with AHI 21/hr . Prostate cancer ([place])  . Restless leg  . Seizures ([place])  Past Surgical History: Past Surgical History: Procedure Laterality Date . APPLICATION OF CRANIAL NAVIGATION N/A [id number]  Procedure: APPLICATION OF CRANIAL NAVIGATION;  Surgeon: Consuella Lose, MD;   Location: [place] NEURO ORS;  Service: Neurosurgery;  Laterality: N/A;  APPLICATION OF CRANIAL NAVIGATION . Bilateral cateract surgery   . COLONOSCOPY    colon polyp removed . CRANIOTOMY Right 09/27/2013  Procedure: RIGHT FRONTAL CRANIOTOMY FOR TUMOR RESECTION ;  Surgeon: Consuella Lose, MD;  Location: [place] NEURO ORS;  Service: Neurosurgery;  Laterality: Right; . CRANIOTOMY Right 02/17/2015  Procedure: Right frontal parietal craniotomy for resection of meningioma with brainlab;  Surgeon: Consuella Lose, MD;  Location: [place] NEURO ORS;  Service: Neurosurgery;  Laterality: Right;  Right frontal parietal craniotomy for resection of meningioma with brainlab . JOINT REPLACEMENT    right knee replacement . LAPAROTOMY N/A 04/04/2016  Procedure: EXPLORATORY LAPAROTOMY, REPAIR OF GASTRIC PERFORATION WITH OMENTAL PATCH, PLACEMENT OF GASTROSTOMY TUBE;  Surgeon: Autumn Messing III, MD;  [facility];  Service: General;  Laterality: N/A; . PROSTATE BIOPSY   . TONSILLECTOMY    Agae 6-7 . VASECTOMY   HPI: 82 yo male adm to [facility] with abdominal pain - pt is s/p surgery with PEG placed 7/17 with intubation a few days.  Per wife, pt has been in ICU and has not had po until yesterday.   Pt started po intake yesteday and was coughing with liquids, therefore diet was changed to nectar liquids and SLP ordered.   Diet was advanced to regular/thin today.  Pt PMH +  for meningioma s/p XRT and surgery, anemia, seizures.  Pt CXR shows atelectasis.   Subjective: pt seen in radiology for MBS Assessment / Plan / Recommendation CHL IP CLINICAL IMPRESSIONS 04/13/2016 Therapy Diagnosis Mild oral phase dysphagia;Mild pharyngeal phase dysphagia Clinical Impression Pt presents with mild oropharyngeal dysphagia, characterized by poor bolus formation of puree and solid with posterior spillage to the vallecula, and right anterior leakage of liquids. Swallow reflex was delayed, with trigger at the vallecula on the initial swallow. Poor airway closure resulted in flash  penetration of thin liquids during the initial swallow. Nectar thick liquid prevented penetration of the initial swallow, however, Residue of thin and nectar thick liquid on tongue base and vallecula was noted to spill to the pyriform prior to trigger of (cued) dry swallow.  Small boluses and immediate second swallow prevented penetration of nectar thick liquids. Recommend dys 3 solids with chopped meats and nectar thick liquids, SMALL bites and sips at a SLOW rate, with 2 swallows per bite/sip. Meds one at a time with puree. RN was notified of results and recommendations, and safe swallow precautions were posted at [facility]. ST will follow for education and assessment of diet tolerance. Recommend repeat MBS prior to advancing liquids, given lack of reflexive response to penetration on this study. Impact on safety and function Risk for inadequate nutrition/hydration;Moderate aspiration risk;Mild aspiration risk   CHL IP TREATMENT RECOMMENDATION 04/13/2016 Treatment Recommendations Therapy as outlined in treatment plan below   Prognosis 04/13/2016 Prognosis for Safe Diet Advancement Guarded Barriers to Reach Goals Motivation CHL IP DIET RECOMMENDATION 04/13/2016 SLP Diet Recommendations Dysphagia 3 (Mech soft) solids;Nectar thick liquid Liquid Administration via No straw;Cup Medication Administration Whole meds with puree Compensations Slow rate;Small sips/bites;Clear throat intermittently;Other (Comment);Minimize environmental distractions;Multiple dry swallows after each bite/sip Postural Changes Seated upright at 90 degrees   CHL IP OTHER RECOMMENDATIONS 04/13/2016 Recommended Consults -- Oral Care Recommendations Oral care BID Other Recommendations Order thickener from pharmacy;Prohibited food (jello, ice cream, thin soups);Remove water pitcher   CHL IP FOLLOW UP RECOMMENDATIONS 04/12/2016 Follow up Recommendations To be determined   CHL IP FREQUENCY AND DURATION 04/13/2016 Speech Therapy Frequency (ACUTE ONLY) min 2x/week  Treatment Duration 1 week    CHL IP ORAL PHASE 04/13/2016 Oral Phase Impaired Oral - Nectar Cup Right anterior bolus loss Oral - Thin Cup Right anterior bolus loss Oral - Puree Reduced posterior propulsion;Delayed oral transit;Premature spillage Oral - Multi-Consistency Premature spillage;Delayed oral transit;Reduced posterior propulsion  CHL IP PHARYNGEAL PHASE 04/13/2016 Pharyngeal Phase Impaired Pharyngeal- Nectar Cup Delayed swallow initiation-vallecula;Reduced pharyngeal peristalsis;Reduced anterior laryngeal mobility;Reduced laryngeal elevation;Reduced airway/laryngeal closure;Reduced tongue base retraction;Penetration/Aspiration during swallow;Pharyngeal residue - valleculae;Delayed swallow initiation-pyriform sinuses Pharyngeal Material enters airway, remains ABOVE vocal cords then ejected out Pharyngeal- Thin Cup Delayed swallow initiation-vallecula;Delayed swallow initiation-pyriform sinuses;Reduced pharyngeal peristalsis;Reduced anterior laryngeal mobility;Reduced laryngeal elevation;Reduced airway/laryngeal closure;Pharyngeal residue - valleculae;Reduced tongue base retraction;Penetration/Aspiration during swallow Pharyngeal Material enters airway, remains ABOVE vocal cords then ejected out Pharyngeal- Puree Delayed swallow initiation-vallecula;Reduced tongue base retraction Pharyngeal- Multi-consistency Delayed swallow initiation-vallecula;Reduced tongue base retraction  CHL IP CERVICAL ESOPHAGEAL PHASE 04/13/2016 Cervical Esophageal Phase [facility] Celia B. [place], [facility], CCC-SLP [id number] Shonna Chock 04/13/2016, 3:19 PM               Assessment/Plan 1. Orthostasis B/p continues to drop to 90's/60's with standing position. B/P Lying down within Normal limits. Currently on irbesartan 75 mg tablet which could be causing severe orthostasis. Also on Flomax 0.4 mcg Capsule which could be contributing to orthostasis. Will discontinue  irbesartan for now then continue to monitor if still orthostatic will D/C  Flomax and consider Urologist to evaluate. Monitor Vital signs Q shift X 1 week. Facility Nurse to [employer] provider if B/P > or = 150/90.   2. Cough Afebrile. Productive whitish yellow thick sputum keeping him up at bedtime. Obtain portable CXR PA/Lat rule out PNA   3. Gastroesophageal reflux disease without esophagitis Currently on Famotidine 20 mg Tablet. Mylanta as needed. Continue to monitor.   4. Attention to PEG tube  Worsening greenish-brownish drainage from PEG site. Refer to GI ASAP for evaluation. PEG tube not in use.      Family/ staff Communication: Reviewed plan of care with patient, Patient's wife and facility Nurse supervisor.  Labs/tests ordered:  CXR PA/Lat rule out PNA   Spent > 40 minutes counseling and discussing plan of care with patient's POA, patient, Facility Nurse supervisor; reviewing medical record; tests; labs; and developing future plan of care.

## 2016-04-23 ENCOUNTER — Encounter (HOSPITAL_COMMUNITY): Payer: Self-pay | Admitting: Emergency Medicine

## 2016-04-23 ENCOUNTER — Emergency Department (HOSPITAL_COMMUNITY): Payer: Medicare Other

## 2016-04-23 ENCOUNTER — Inpatient Hospital Stay (HOSPITAL_COMMUNITY): Payer: Medicare Other

## 2016-04-23 ENCOUNTER — Inpatient Hospital Stay (HOSPITAL_COMMUNITY)
Admission: EM | Admit: 2016-04-23 | Discharge: 2016-04-30 | DRG: 175 | Disposition: A | Discharge status: Skilled Nursing Facility | Payer: Medicare Other | Attending: Internal Medicine | Admitting: Internal Medicine

## 2016-04-23 DIAGNOSIS — E785 Hyperlipidemia, unspecified: Secondary | ICD-10-CM | POA: Diagnosis present

## 2016-04-23 DIAGNOSIS — I351 Nonrheumatic aortic (valve) insufficiency: Secondary | ICD-10-CM | POA: Diagnosis present

## 2016-04-23 DIAGNOSIS — Y833 Surgical operation with formation of external stoma as the cause of abnormal reaction of the patient, or of later complication, without mention of misadventure at the time of the procedure: Secondary | ICD-10-CM | POA: Diagnosis present

## 2016-04-23 DIAGNOSIS — Z85828 Personal history of other malignant neoplasm of skin: Secondary | ICD-10-CM

## 2016-04-23 DIAGNOSIS — K219 Gastro-esophageal reflux disease without esophagitis: Secondary | ICD-10-CM | POA: Diagnosis present

## 2016-04-23 DIAGNOSIS — R112 Nausea with vomiting, unspecified: Secondary | ICD-10-CM | POA: Diagnosis present

## 2016-04-23 DIAGNOSIS — G40109 Localization-related (focal) (partial) symptomatic epilepsy and epileptic syndromes with simple partial seizures, not intractable, without status epilepticus: Secondary | ICD-10-CM

## 2016-04-23 DIAGNOSIS — D329 Benign neoplasm of meninges, unspecified: Secondary | ICD-10-CM | POA: Diagnosis present

## 2016-04-23 DIAGNOSIS — N183 Chronic kidney disease, stage 3 unspecified: Secondary | ICD-10-CM | POA: Diagnosis present

## 2016-04-23 DIAGNOSIS — G4733 Obstructive sleep apnea (adult) (pediatric): Secondary | ICD-10-CM | POA: Diagnosis present

## 2016-04-23 DIAGNOSIS — R911 Solitary pulmonary nodule: Secondary | ICD-10-CM | POA: Diagnosis present

## 2016-04-23 DIAGNOSIS — Z888 Allergy status to other drugs, medicaments and biological substances status: Secondary | ICD-10-CM

## 2016-04-23 DIAGNOSIS — D509 Iron deficiency anemia, unspecified: Secondary | ICD-10-CM | POA: Diagnosis present

## 2016-04-23 DIAGNOSIS — Z923 Personal history of irradiation: Secondary | ICD-10-CM

## 2016-04-23 DIAGNOSIS — Z885 Allergy status to narcotic agent status: Secondary | ICD-10-CM | POA: Diagnosis not present

## 2016-04-23 DIAGNOSIS — E872 Acidosis, unspecified: Secondary | ICD-10-CM | POA: Diagnosis present

## 2016-04-23 DIAGNOSIS — D72829 Elevated white blood cell count, unspecified: Secondary | ICD-10-CM | POA: Diagnosis present

## 2016-04-23 DIAGNOSIS — Z92241 Personal history of systemic steroid therapy: Secondary | ICD-10-CM | POA: Diagnosis not present

## 2016-04-23 DIAGNOSIS — I2699 Other pulmonary embolism without acute cor pulmonale: Secondary | ICD-10-CM | POA: Diagnosis not present

## 2016-04-23 DIAGNOSIS — Z8546 Personal history of malignant neoplasm of prostate: Secondary | ICD-10-CM

## 2016-04-23 DIAGNOSIS — Z886 Allergy status to analgesic agent status: Secondary | ICD-10-CM | POA: Diagnosis not present

## 2016-04-23 DIAGNOSIS — R110 Nausea: Secondary | ICD-10-CM | POA: Diagnosis present

## 2016-04-23 DIAGNOSIS — E669 Obesity, unspecified: Secondary | ICD-10-CM | POA: Diagnosis present

## 2016-04-23 DIAGNOSIS — G2581 Restless legs syndrome: Secondary | ICD-10-CM | POA: Diagnosis present

## 2016-04-23 DIAGNOSIS — R238 Other skin changes: Secondary | ICD-10-CM

## 2016-04-23 DIAGNOSIS — I959 Hypotension, unspecified: Secondary | ICD-10-CM | POA: Diagnosis present

## 2016-04-23 DIAGNOSIS — Z79899 Other long term (current) drug therapy: Secondary | ICD-10-CM | POA: Diagnosis not present

## 2016-04-23 DIAGNOSIS — R1110 Vomiting, unspecified: Secondary | ICD-10-CM | POA: Diagnosis not present

## 2016-04-23 DIAGNOSIS — R55 Syncope and collapse: Secondary | ICD-10-CM | POA: Diagnosis not present

## 2016-04-23 DIAGNOSIS — I10 Essential (primary) hypertension: Secondary | ICD-10-CM | POA: Diagnosis present

## 2016-04-23 DIAGNOSIS — R29898 Other symptoms and signs involving the musculoskeletal system: Secondary | ICD-10-CM

## 2016-04-23 DIAGNOSIS — I130 Hypertensive heart and chronic kidney disease with heart failure and stage 1 through stage 4 chronic kidney disease, or unspecified chronic kidney disease: Secondary | ICD-10-CM | POA: Diagnosis present

## 2016-04-23 DIAGNOSIS — K9423 Gastrostomy malfunction: Secondary | ICD-10-CM | POA: Diagnosis present

## 2016-04-23 DIAGNOSIS — R1114 Bilious vomiting: Secondary | ICD-10-CM | POA: Diagnosis not present

## 2016-04-23 DIAGNOSIS — Z96651 Presence of right artificial knee joint: Secondary | ICD-10-CM | POA: Diagnosis present

## 2016-04-23 DIAGNOSIS — I5031 Acute diastolic (congestive) heart failure: Secondary | ICD-10-CM | POA: Diagnosis present

## 2016-04-23 LAB — CBC WITH DIFFERENTIAL/PLATELET
BASOS ABS: 0 10*3/uL (ref 0.0–0.1)
Basophils Relative: 0 %
Eosinophils Absolute: 0 10*3/uL (ref 0.0–0.7)
Eosinophils Relative: 0 %
HEMATOCRIT: 43.6 % (ref 39.0–52.0)
HEMOGLOBIN: 13.4 g/dL (ref 13.0–17.0)
LYMPHS PCT: 7 %
Lymphs Abs: 1 10*3/uL (ref 0.7–4.0)
MCH: 28.2 pg (ref 26.0–34.0)
MCHC: 30.7 g/dL (ref 30.0–36.0)
MCV: 91.6 fL (ref 78.0–100.0)
MONO ABS: 1.5 10*3/uL — AB (ref 0.1–1.0)
MONOS PCT: 11 %
NEUTROS ABS: 11.6 10*3/uL — AB (ref 1.7–7.7)
NEUTROS PCT: 82 %
Platelets: 277 10*3/uL (ref 150–400)
RBC: 4.76 MIL/uL (ref 4.22–5.81)
RDW: 18.9 % — AB (ref 11.5–15.5)
WBC: 14.2 10*3/uL — ABNORMAL HIGH (ref 4.0–10.5)

## 2016-04-23 LAB — URINALYSIS, ROUTINE W REFLEX MICROSCOPIC
Bilirubin Urine: NEGATIVE
GLUCOSE, UA: NEGATIVE mg/dL
Hgb urine dipstick: NEGATIVE
KETONES UR: NEGATIVE mg/dL
LEUKOCYTES UA: NEGATIVE
Nitrite: NEGATIVE
Protein, ur: 30 mg/dL — AB
SPECIFIC GRAVITY, URINE: 1.045 — AB (ref 1.005–1.030)
pH: 7 (ref 5.0–8.0)

## 2016-04-23 LAB — CBC
HEMATOCRIT: 43.2 % (ref 39.0–52.0)
HEMOGLOBIN: 13.5 g/dL (ref 13.0–17.0)
MCH: 28.3 pg (ref 26.0–34.0)
MCHC: 31.3 g/dL (ref 30.0–36.0)
MCV: 90.6 fL (ref 78.0–100.0)
Platelets: 239 10*3/uL (ref 150–400)
RBC: 4.77 MIL/uL (ref 4.22–5.81)
RDW: 18.9 % — AB (ref 11.5–15.5)
WBC: 13.5 10*3/uL — AB (ref 4.0–10.5)

## 2016-04-23 LAB — URINE MICROSCOPIC-ADD ON: BACTERIA UA: NONE SEEN

## 2016-04-23 LAB — I-STAT CG4 LACTIC ACID, ED
LACTIC ACID, VENOUS: 2.45 mmol/L — AB (ref 0.5–1.9)
LACTIC ACID, VENOUS: 2.44 mmol/L — AB (ref 0.5–1.9)

## 2016-04-23 LAB — COMPREHENSIVE METABOLIC PANEL
ALBUMIN: 2.4 g/dL — AB (ref 3.5–5.0)
ALT: 30 U/L (ref 17–63)
ANION GAP: 11 (ref 5–15)
AST: 31 U/L (ref 15–41)
Alkaline Phosphatase: 72 U/L (ref 38–126)
BUN: 29 mg/dL — ABNORMAL HIGH (ref 6–20)
CHLORIDE: 100 mmol/L — AB (ref 101–111)
CO2: 24 mmol/L (ref 22–32)
Calcium: 8.7 mg/dL — ABNORMAL LOW (ref 8.9–10.3)
Creatinine, Ser: 1.08 mg/dL (ref 0.61–1.24)
GFR calc Af Amer: >60 mL/min (ref >60)
GFR calc non Af Amer: >60 mL/min (ref >60)
GLUCOSE: 110 mg/dL — AB (ref 65–99)
POTASSIUM: 3.8 mmol/L (ref 3.5–5.1)
SODIUM: 135 mmol/L (ref 135–145)
Total Bilirubin: 0.5 mg/dL (ref 0.3–1.2)
Total Protein: 5.8 g/dL — ABNORMAL LOW (ref 6.5–8.1)

## 2016-04-23 LAB — MRSA PCR SCREENING: MRSA BY PCR: NEGATIVE

## 2016-04-23 LAB — I-STAT CHEM 8, ED
BUN: 30 mg/dL — AB (ref 6–20)
CALCIUM ION: 1.08 mmol/L — AB (ref 1.12–1.23)
CHLORIDE: 99 mmol/L — AB (ref 101–111)
Creatinine, Ser: 1 mg/dL (ref 0.61–1.24)
Glucose, Bld: 106 mg/dL — ABNORMAL HIGH (ref 65–99)
HEMATOCRIT: 43 % (ref 39.0–52.0)
Hemoglobin: 14.6 g/dL (ref 13.0–17.0)
Potassium: 3.7 mmol/L (ref 3.5–5.1)
SODIUM: 136 mmol/L (ref 135–145)
TCO2: 27 mmol/L (ref 0–100)

## 2016-04-23 LAB — POC OCCULT BLOOD, ED: FECAL OCCULT BLD: POSITIVE — AB

## 2016-04-23 LAB — TYPE AND SCREEN
ABO/RH(D): O POS
Antibody Screen: NEGATIVE

## 2016-04-23 LAB — PROTIME-INR
INR: 1.1
PROTHROMBIN TIME: 14.3 s (ref 11.4–15.2)

## 2016-04-23 LAB — LACTIC ACID, PLASMA: LACTIC ACID, VENOUS: 1.5 mmol/L (ref 0.5–1.9)

## 2016-04-23 MED ORDER — LEVETIRACETAM 750 MG PO TABS
750.0000 mg | ORAL_TABLET | Freq: Two times a day (BID) | ORAL | Status: DC
  Administered 2016-04-23 – 2016-04-30 (×15): 750 mg via ORAL
  Filled 2016-04-23 (×15): qty 1

## 2016-04-23 MED ORDER — ALBUTEROL SULFATE (2.5 MG/3ML) 0.083% IN NEBU: 2.5000 mg | INHALATION_SOLUTION | RESPIRATORY_TRACT | Status: DC | PRN

## 2016-04-23 MED ORDER — DEXAMETHASONE 4 MG PO TABS
2.0000 mg | ORAL_TABLET | Freq: Every day | ORAL | Status: DC
  Administered 2016-04-23 – 2016-04-27 (×5): 2 mg via ORAL
  Filled 2016-04-23 (×5): qty 1

## 2016-04-23 MED ORDER — SODIUM CHLORIDE 0.9 % IV BOLUS (SEPSIS)
1000.0000 mL | Freq: Once | INTRAVENOUS | Status: AC
  Administered 2016-04-23: 1000 mL via INTRAVENOUS

## 2016-04-23 MED ORDER — IOPAMIDOL (ISOVUE-300) INJECTION 61%
INTRAVENOUS | Status: AC
  Administered 2016-04-23: 100 mL
  Filled 2016-04-23: qty 100

## 2016-04-23 MED ORDER — HEPARIN BOLUS VIA INFUSION
5000.0000 [IU] | Freq: Once | INTRAVENOUS | Status: AC
  Administered 2016-04-23: 5000 [IU] via INTRAVENOUS
  Filled 2016-04-23: qty 5000

## 2016-04-23 MED ORDER — BARRIER CREAM NON-SPECIFIED
1.0000 "application " | TOPICAL_CREAM | Freq: Two times a day (BID) | TOPICAL | Status: DC | PRN
  Filled 2016-04-23 (×2): qty 1

## 2016-04-23 MED ORDER — ONDANSETRON HCL 4 MG/2ML IJ SOLN
4.0000 mg | Freq: Once | INTRAMUSCULAR | Status: AC
  Administered 2016-04-23: 4 mg via INTRAVENOUS
  Filled 2016-04-23: qty 2

## 2016-04-23 MED ORDER — FAMOTIDINE IN NACL 20-0.9 MG/50ML-% IV SOLN
20.0000 mg | Freq: Two times a day (BID) | INTRAVENOUS | Status: DC
  Administered 2016-04-23 – 2016-04-24 (×3): 20 mg via INTRAVENOUS
  Filled 2016-04-23 (×3): qty 50

## 2016-04-23 MED ORDER — HEPARIN (PORCINE) IN NACL 100-0.45 UNIT/ML-% IJ SOLN
1000.0000 [IU]/h | INTRAMUSCULAR | Status: DC
  Administered 2016-04-23: 1400 [IU]/h via INTRAVENOUS
  Administered 2016-04-24 – 2016-04-25 (×2): 1250 [IU]/h via INTRAVENOUS
  Filled 2016-04-23 (×5): qty 250

## 2016-04-23 MED ORDER — PIPERACILLIN-TAZOBACTAM 3.375 G IVPB
3.3750 g | Freq: Three times a day (TID) | INTRAVENOUS | Status: DC
  Administered 2016-04-23 – 2016-04-27 (×11): 3.375 g via INTRAVENOUS
  Filled 2016-04-23 (×14): qty 50

## 2016-04-23 MED ORDER — SODIUM CHLORIDE 0.9 % IV BOLUS (SEPSIS)
500.0000 mL | Freq: Once | INTRAVENOUS | Status: AC
  Administered 2016-04-23: 500 mL via INTRAVENOUS

## 2016-04-23 MED ORDER — PIPERACILLIN-TAZOBACTAM 3.375 G IVPB 30 MIN: 3.3750 g | Freq: Once | INTRAVENOUS | Status: DC

## 2016-04-23 MED ORDER — TAMSULOSIN HCL 0.4 MG PO CAPS
0.4000 mg | ORAL_CAPSULE | Freq: Every day | ORAL | Status: DC
  Administered 2016-04-23 – 2016-04-29 (×6): 0.4 mg via ORAL
  Filled 2016-04-23 (×8): qty 1

## 2016-04-23 MED ORDER — DIATRIZOATE MEGLUMINE & SODIUM 66-10 % PO SOLN
ORAL | Status: AC
  Filled 2016-04-23: qty 30

## 2016-04-23 MED ORDER — PIPERACILLIN-TAZOBACTAM 3.375 G IVPB
3.3750 g | Freq: Once | INTRAVENOUS | Status: AC
  Administered 2016-04-23: 3.375 g via INTRAVENOUS
  Filled 2016-04-23: qty 50

## 2016-04-23 MED ORDER — LAMOTRIGINE 100 MG PO TABS
100.0000 mg | ORAL_TABLET | Freq: Two times a day (BID) | ORAL | Status: DC
  Administered 2016-04-23 – 2016-04-30 (×15): 100 mg via ORAL
  Filled 2016-04-23 (×15): qty 1

## 2016-04-23 MED ORDER — IOPAMIDOL (ISOVUE-370) INJECTION 76%
INTRAVENOUS | Status: AC
  Administered 2016-04-23: 80 mL
  Filled 2016-04-23: qty 100

## 2016-04-23 MED ORDER — SODIUM CHLORIDE 0.9 % IV SOLN
INTRAVENOUS | Status: DC
  Administered 2016-04-23: 12:00:00 via INTRAVENOUS

## 2016-04-23 NOTE — ED Notes (Signed)
RN assisted Dr. Hulen Skains with G-tube removal and replacement at this time. Dressing changed and drainage noted around tube still.

## 2016-04-23 NOTE — H&P (Signed)
History and Physical    [name] [id number] DOB: 1931-08-13 DOA: 04/23/2016  PCP:  Melinda Crutch, MD Patient [facility]  Chief Complaint: nausea/vomiting/hypotension  HPI: [name] is a 82 y.o. male with medical history significant for seizures, prostate, hypertension, hyperlipidemia, GERD, aortic insufficiency, prostate cancer status post radiation therapy 2010, perforated gastric ulcer status post surgical repair 1 week ago presents to the emergency Department chief complaint of emesis generalized weakness hypotension. Initial evaluation reveals elevated lactic acid leukocytosis CT of the abdomen concerning for early abscess and/or pulmonary emboli.  Information is obtained from the patient and the chart. He states he was doing well at [facility] place after his surgery to weeks . He does endorse some generalized weakness and dizziness and has had difficulty bearing weight but is undergoing physical therapy for that. He also reports persistent productive cough since surgery. Last evening he reports coughing up blood-tinged sputum. Thereafter he experienced brown emesis times one episode. He denies abdominal pain headache syncope or near-syncope. He denies any diarrhea constipation. He does have a G-tube which he says has been leaking since surgery. He denies chest pain palpitations shortness of breath lower extremity edema. He denies diarrhea constipation dysuria hematuria frequency or urgency. THis morning EMS was called and reportedly systolic blood pressure in the 70s. Upon presentation to the emergency department systolic blood pressure in the 90s.   ED Course: In the emergency department he is hemodynamically stable and not tachycardic afebrile nontoxic appearing. He is provided with IV fluids  Review of Systems: As per HPI otherwise 10 point review of systems negative.   Ambulatory Status: Has been nonambulatory since surgery. Wife reports used walker prior to that with an  unsteady gait  Past Medical History:  Diagnosis Date  . Anemia   . Aortic insufficiency 08/11/2015  . Basal cell cancer   . Brain tumor ([place])   . GERD (gastroesophageal reflux disease)   . Herniated disc    lumbar spine  . Hiatal hernia   . History of radiation therapy    back approximately 2010 prostate radiation under care of Dr. Valere Dross   . Hyperlipidemia   . Hypertension   . Meningioma ([place])    brain  . Nocturia   . Occult blood in stools   . OSA (obstructive sleep apnea) 10/28/2014   Moderate with AHI 21/hr  . Prostate cancer ([place])   . Restless leg   . Seizures ([place])     Past Surgical History:  Procedure Laterality Date  . APPLICATION OF CRANIAL NAVIGATION N/A 02/17/2015   Procedure: APPLICATION OF CRANIAL NAVIGATION;  Surgeon: Consuella Lose, MD;  Location: [facility];  Service: Neurosurgery;  Laterality: N/A;  APPLICATION OF CRANIAL NAVIGATION  . Bilateral cateract surgery    . COLONOSCOPY     colon polyp removed  . CRANIOTOMY Right 09/27/2013   Procedure: RIGHT FRONTAL CRANIOTOMY FOR TUMOR RESECTION ;  Surgeon: Consuella Lose, MD;  Location: [facility];  Service: Neurosurgery;  Laterality: Right;  . CRANIOTOMY Right 02/17/2015   Procedure: Right frontal parietal craniotomy for resection of meningioma with brainlab;  Surgeon: Consuella Lose, MD;  Location: [facility];  Service: Neurosurgery;  Laterality: Right;  Right frontal parietal craniotomy for resection of meningioma with brainlab  . JOINT REPLACEMENT     right knee replacement  . LAPAROTOMY N/A 04/04/2016   Procedure: EXPLORATORY LAPAROTOMY, REPAIR OF GASTRIC PERFORATION WITH OMENTAL PATCH, PLACEMENT OF GASTROSTOMY TUBE;  Surgeon: Autumn Messing III, MD;  Location: [facility] ORS;  Service: General;  Laterality: N/A;  . PROSTATE BIOPSY    . TONSILLECTOMY     Agae 6-7  . VASECTOMY      Social History   Social History  . Marital status: Married    Spouse name: [name]  . Number of children: 2  . Years of  education: 12   Occupational History  . Retired, Furniture conservator/restorer    Social History Main Topics  . Smoking status: Never Smoker  . Smokeless tobacco: Former Systems developer    Types: Chew    Quit date: 10/08/1973  . Alcohol use No  . Drug use: No  . Sexual activity: Not Currently   Other Topics Concern  . Not on file   Social History Narrative   Patient is married([name]) lives with wife in [place].  Normally independent of ADLs.   Patient is retired.   Patient has two children.   Patient has a high school education.   Right handed          Allergies  Allergen Reactions  . Asa [Aspirin] Nausea Only  . Other Itching and Rash    Nuts  . Vicodin [Hydrocodone-Acetaminophen] Nausea Only    Family History  Problem Relation Age of Onset  . Parkinsonism Mother   . Dementia Father     Prior to Admission medications   Medication Sig Start Date End Date Taking? Authorizing Provider  acetaminophen (TYLENOL) 325 MG tablet Take 2 tablets (650 mg total) by mouth every 4 (four) hours as needed. Patient taking differently: Take 650 mg by mouth every 4 (four) hours as needed for mild pain.  04/15/16 04/15/17 Yes Earnstine Regal, PA-C  albuterol (PROVENTIL) (2.5 MG/3ML) 0.083% nebulizer solution Take 3 mLs (2.5 mg total) by nebulization every 4 (four) hours as needed for wheezing or shortness of breath. 04/15/16  Yes Earnstine Regal, PA-C  b complex vitamins tablet Take 1 tablet by mouth daily.   Yes Historical Provider, MD  dexamethasone (DECADRON) 0.5 MG tablet Take 0.5 mg by mouth daily. Stop date 05/13/16   Yes Historical Provider, MD  dexamethasone (DECADRON) 1 MG tablet Take 1 mg by mouth daily. Stop date 05/06/16   Yes Historical Provider, MD  dexamethasone (DECADRON) 2 MG tablet Take 2 mg by mouth daily. Stop date 04/29/16   Yes Historical Provider, MD  famotidine (PEPCID) 20 MG tablet Take 1 tablet (20 mg total) by mouth 2 (two) times daily. 04/15/16  Yes Earnstine Regal, PA-C  ferrous sulfate  325 (65 FE) MG tablet Take 1,300 mg by mouth every morning.    Yes Historical Provider, MD  irbesartan (AVAPRO) 75 MG tablet Take 75 mg by mouth daily.   Yes Historical Provider, MD  lamoTRIgine (LAMICTAL) 100 MG tablet Take 1 tablet (100 mg total) by mouth 2 (two) times daily. 04/18/16  Yes Cameron Sprang, MD  levETIRAcetam (KEPPRA) 500 MG tablet Take 1 & 1/2 tablets twice a day 12/01/15  Yes Cameron Sprang, MD  multivitamin-lutein [facility]) CAPS capsule Take 1 capsule by mouth daily.   Yes Historical Provider, MD  nystatin cream (MYCOSTATIN) Apply 1 application topically 2 (two) times daily.   Yes Historical Provider, MD  ondansetron (ZOFRAN-ODT) 4 MG disintegrating tablet Take 1 tablet (4 mg total) by mouth every 6 (six) hours as needed for nausea. 04/15/16  Yes Earnstine Regal, PA-C  rOPINIRole (REQUIP) 3 MG tablet Take 1.5 mg by mouth 2 (two) times daily.   Yes Historical Provider, MD  solifenacin (VESICARE)  5 MG tablet Take 1 tablet (5 mg total) by mouth daily. 10/18/13  Yes Daniel J Angiulli, PA-C  tamsulosin (FLOMAX) 0.4 MG CAPS capsule Take 1 capsule (0.4 mg total) by mouth daily after supper. 10/18/13  Yes Daniel J Angiulli, PA-C  vitamin B-12 (CYANOCOBALAMIN) 100 MCG tablet Take 100 mcg by mouth daily.    Yes Historical Provider, MD    Physical Exam: Vitals:   04/23/16 0830 04/23/16 0915 04/23/16 1145 04/23/16 1215  BP: 106/64 129/70 127/65 127/73  Pulse: 85 85 94 82  Resp: 21 20 22 17   SpO2: 97% 95% 96% 95%     General:  Appears calm and comfortable Slightly pale but not uncomfortable Eyes:  PERRL, EOMI, normal lids, iris ENT:  grossly normal hearing, lips & tongue, mucous membranes of his mouth are moist and pink Neck:  no LAD, masses or thyromegaly Cardiovascular:  RRR, no m/r/g. No lower extremity edema  Respiratory:  CTA bilaterally, no w/r/r. Normal respiratory effort. Abdomen:  Slightly firm somewhat distended sluggish bowel sounds nontender no guarding.  Periumbilical wound VAC dressing dry and intact. G-tube in the left upper quadrant with macerated skin around site Skin:  G-tube site excoriated/macerated some swelling Musculoskeletal:  grossly normal tone BUE/BLE, good ROM, no bony abnormality Psychiatric:  grossly normal mood and affect, speech fluent and appropriate, AOx3 Neurologic:  CN 2-12 grossly intact, moves all extremities in coordinated fashion, sensation intact  Labs on Admission: I have personally reviewed following labs and imaging studies  CBC:  Recent Labs Lab 04/23/16 0421 04/23/16 0445 04/23/16 0723  WBC 14.2*  --  13.5*  NEUTROABS 11.6*  --   --   HGB 13.4 14.6 13.5  HCT 43.6 43.0 43.2  MCV 91.6  --  90.6  PLT 277  --  [id number]   Basic Metabolic Panel:  Recent Labs Lab 04/23/16 0421 04/23/16 0445  NA 135 136  K 3.8 3.7  CL 100* 99*  CO2 24  --   GLUCOSE 110* 106*  BUN 29* 30*  CREATININE 1.08 1.00  CALCIUM 8.7*  --    GFR: Estimated Creatinine Clearance: 61.4 mL/min (by C-G formula based on SCr of 1 mg/dL). Liver Function Tests:  Recent Labs Lab 04/23/16 0421  AST 31  ALT 30  ALKPHOS 72  BILITOT 0.5  PROT 5.8*  ALBUMIN 2.4*   No results for input(s): LIPASE, AMYLASE in the last 168 hours. No results for input(s): AMMONIA in the last 168 hours. Coagulation Profile:  Recent Labs Lab 04/23/16 1337  INR 1.10   Cardiac Enzymes: No results for input(s): CKTOTAL, CKMB, CKMBINDEX, TROPONINI in the last 168 hours. BNP (last 3 results) No results for input(s): PROBNP in the last 8760 hours. HbA1C: No results for input(s): HGBA1C in the last 72 hours. CBG: No results for input(s): GLUCAP in the last 168 hours. Lipid Profile: No results for input(s): CHOL, HDL, LDLCALC, TRIG, CHOLHDL, LDLDIRECT in the last 72 hours. Thyroid Function Tests: No results for input(s): TSH, T4TOTAL, FREET4, T3FREE, THYROIDAB in the last 72 hours. Anemia Panel: No results for input(s): VITAMINB12, FOLATE,  FERRITIN, TIBC, IRON, RETICCTPCT in the last 72 hours. Urine analysis:    Component Value Date/Time   COLORURINE YELLOW 04/23/2016 1221   APPEARANCEUR TURBID (A) 04/23/2016 1221   LABSPEC 1.045 (H) 04/23/2016 1221   PHURINE 7.0 04/23/2016 1221   GLUCOSEU NEGATIVE 04/23/2016 1221   HGBUR NEGATIVE 04/23/2016 [place] 04/23/2016 1221   KETONESUR NEGATIVE 04/23/2016 1221  PROTEINUR 30 (A) 04/23/2016 1221   UROBILINOGEN 0.2 07/31/2014 1941   NITRITE NEGATIVE 04/23/2016 1221   LEUKOCYTESUR NEGATIVE 04/23/2016 1221    Creatinine Clearance: Estimated Creatinine Clearance: 61.4 mL/min (by C-G formula based on SCr of 1 mg/dL).  Sepsis Labs: @LABRCNTIP (procalcitonin:4,lacticidven:4) )No results found for this or any previous visit (from the past 240 hour(s)).   Radiological Exams on Admission: Ct Abdomen Pelvis W Contrast  Result Date: 04/23/2016 CLINICAL DATA:  Discharge from hospital on July 28th after abdominal surgery. Now with sharp pains at surgical site. Hematemesis last night. Status post recent laparotomy with repair of gastric perforation and placement of gastrostomy tube. EXAM: CT ABDOMEN AND PELVIS WITH CONTRAST TECHNIQUE: Multidetector CT imaging of the abdomen and pelvis was performed using the standard protocol following bolus administration of intravenous contrast. CONTRAST:  195mL ISOVUE-300 IOPAMIDOL (ISOVUE-300) INJECTION 61% COMPARISON:  CT abdomen status 04/04/2016 09/11/2015. FINDINGS: Lower chest: Small left pleural effusion and mild bibasilar atelectasis. Multiple acute appearing pulmonary emboli are seen within the segmental pulmonary artery branches to the right lower lobe. Hepatobiliary: Small layering stones within the otherwise normal-appearing gallbladder. Liver appears normal. Pancreas: Partially infiltrated with fat but otherwise unremarkable. Spleen: Within normal limits in size and appearance. Adrenals/Urinary Tract: Adrenal glands appear normal.  Kidneys appear normal without mass, stone or hydronephrosis. No ureteral or bladder calculi identified. Bladder appears normal. Stomach/Bowel: Small hiatal hernia, significantly improved in appearance compared to the recent CT of 04/04/2016. There is a circumscribed fluid collection within the lower mediastinum, just to the left of the anterior margin of the lower esophagus, measuring 2.7 x 1.6 cm, suspicious for early developing abscess. Additional smaller less well circumscribed low-density collection is seen adjacent to the stomach cardia, measuring approximately 2.6 x 1.5 cm, possible additional early abscess formation. No dilated large or small bowel loops. Gastrostomy tube appears well positioned. Scattered diverticulosis within the descending and sigmoid colon without evidence of acute diverticulitis. Vascular/Lymphatic: Scattered atherosclerotic changes of the normal caliber abdominal aorta. No enlarged lymph nodes seen. Reproductive: No mass or other significant abnormality. Other: No free intraperitoneal air. Musculoskeletal: Degenerative changes throughout the slightly scoliotic thoracolumbar spine. No acute or suspicious osseous finding. IMPRESSION: 1. Interval surgical changes of the stomach, compatible with the given history of recent repair of gastric perforation and placement of gastrostomy tube. Circumscribed fluid density collection within the lower mediastinum, chest to the left of the anterior margin of the lower esophagus, measuring 2.7 x 1.6 cm, possibly expected postoperative fluid collection but suspicious for early developing abscess collection. Associated perforation cannot be excluded, but no extraluminal air to confirm. 2. Poorly defined low-density collection adjacent to the stomach cardia, measuring approximately 2.6 x 1.5 cm, expected postsurgical fluid versus additional early developing abscess. 3. Acute appearing pulmonary emboli within segmental pulmonary artery branches to the  right lower lobe. Would consider CT pulmonary angiogram for more complete characterization of the pulmonary arteries. 4. Gastrostomy tube appears well positioned. 5. No bowel obstruction.  No free intraperitoneal air. 6. Colonic diverticulosis without evidence of acute diverticulitis. 7. Cholelithiasis without evidence of acute cholecystitis. 8. Aortic atherosclerosis. 9. Small left pleural effusion. Critical Value/emergent results were called by telephone at the time of interpretation on 04/23/2016 at 12:22 pm to Dr. Johnney Killian, who verbally acknowledged these results. Electronically Signed   By: Franki Cabot M.D.   On: 04/23/2016 12:24   Dg Chest Port 1 View  Result Date: 04/23/2016 CLINICAL DATA:  82 year old male with cough and hypotension EXAM: PORTABLE CHEST 1  VIEW COMPARISON:  Chest radiograph dated 04/08/2016 FINDINGS: Single portable view of chest demonstrates emphysematous changes of the lungs. There is mild eventration of the left hemidiaphragm. No focal consolidation, pleural effusion, or pneumothorax. Stable cardiac silhouette. Osteopenia with degenerative changes of the shoulders. No acute osseous pathology. IMPRESSION: No active disease. Electronically Signed   By: Anner Crete M.D.   On: 04/23/2016 04:56    EKG: Independently reviewed. Sinus rhythm Left axis deviation RSR' in V1 or V2, probably normal variant no change from previous.  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 Nausea and vomiting. Etiology unclear likely multifactorial i.e. possible  early abscess development post-op abdominal surgery, persistent coughing leading to emesis in the setting of hypotension. No further episodes. CT of the abdomen pelvis with possible abscess formation and/or possible pulmonary emboli. Lactic acid elevated, mild leukocytosis. He is afebrile and nontoxic appearing. Blood pressure soft on presentation but improved at time of admission after IV fluids. Patient evaluated by general surgery in the emergency department who opined not surgical problem recommended medical admission - Admit to telemetry -Clear liquids to be advanced as tolerated -CT angio chest to rule out PE -Continue Zosyn -Zofran as needed -continue home pepcid  #2. Lactic acidosis/leukocytosis. Initial lactic acid 2.45 follow-up after fluids 2.44. Likely related to dehydration. BUN 30 creatinine 1. Concern for possible abscess per abdominal CT. He is afebrile but does have a leukocytosis. Blood pressure improved not tachycardic no signs acute organ failure. Zosyn initiated in the emergency department -Continue IV fluids -Continue Zosyn -Recheck in 3 hours  #3. Perforated gastric ulcer. Status post repair on July 17. Patient at snf since surgery. Evaluated by general surgery who opined CT demonstrates PE and small fluid collection. Progress note per general surgery  not recommending surgery given the current findings. G-tube replaced per general surgery. -appreciate general surgery input  #4. Hypertension. Blood pressure soft on admission. Home medications include Avapro -Hold for now -Continue IV fluids -Check vitals every 43 -Resume as indicated  #5. History of seizures/epileptic syndrome. No recent seizures. Medications include Keppra and lamictal. Baseline is walking with a walker since surgery unable to ambulate -Continue home meds  #6. Anemia. Chart review indicates history of iron deficiency anemia. Hemoglobin 13.5 on admission. Provided with IV iron in the past. No signs  symptoms of active bleeding -Hemoglobin stable -Monitor  7. Steroid use. He was placed on steroid taper by his PCP last month after a fall. He got down to 0.5 daily. This was increased since secondary to recent hospitalization. currently in the middle of another taper -Decadron 2 mg by mouth until August 11  #8. Abnormal abdominal CT. Possible abscess. evaluated by general surgery as noted above. Also noted possible PE. No shortness of breath no hypoxia and no chest pain -We will obtain CT angio of chest -if positive anti-coagulation may be challenging  #9. Meningioma. Recurrent. Wife states he's had 2 surgery and radiation in the past.  #10. skin irritation. Related to G-tube leakage. G-tube changed by general surgery in the emergency department -. Barrier ointment -Wound consult  11. Cough. Intermittent somewhat productive. CT abdomen pelvis reveals lower lobe atelectasis, left small pleural effusion and acute appearing PE. No hypoxia no chest pain no tachycardia. Mild leukocytosis afebrile nontoxic appearing. I clearly related to immobility secondary to recent surgery -CT rule out PE -Incentive spirometry -Increase mobilization  #12. PUD. CT reveals bilateral PE borderline right heart strain. Anticoagulation discussed with general surgery per Dr. Natasha Bence -Heparin drip per pharmacy -Transfer to stepdown  DVT prophylaxis: scd  Code Status: full  Family Communication: wife at bedside  Disposition Plan: back to snf  Consults called: general surgery  Admission status: inpatient    Radene Gunning MD [facility]  If 7PM-7AM, please contact night-coverage [email or web address] Password [email or web address]  04/23/2016, 2:22 PM

## 2016-04-23 NOTE — Consult Note (Signed)
Reason for Consult:[name] around esophagus Referring Physician: [name] is an 82 y.o. male.  HPI: Patient status post repair of perforated gastric ulcer on 7/17 at [facility].  Started coughing up blood according to the patient and subsequently had one episode of vomiting, not bloody.    Past Medical History:  Diagnosis Date  . Anemia   . Aortic insufficiency 08/11/2015  . Basal cell cancer   . Brain tumor ([place])   . GERD (gastroesophageal reflux disease)   . Herniated disc    lumbar spine  . Hiatal hernia   . History of radiation therapy    back approximately 2010 prostate radiation under care of Dr. Valere Dross   . Hyperlipidemia   . Hypertension   . Meningioma ([place])    brain  . Nocturia   . Occult blood in stools   . OSA (obstructive sleep apnea) 10/28/2014   Moderate with AHI 21/hr  . Prostate cancer ([place])   . Restless leg   . Seizures ([place])     Past Surgical History:  Procedure Laterality Date  . APPLICATION OF CRANIAL NAVIGATION N/A 02/17/2015   Procedure: APPLICATION OF CRANIAL NAVIGATION;  Surgeon: Consuella Lose, MD;  Location: [place] NEURO ORS;  Service: Neurosurgery;  Laterality: N/A;  APPLICATION OF CRANIAL NAVIGATION  . Bilateral cateract surgery    . COLONOSCOPY     colon polyp removed  . CRANIOTOMY Right 09/27/2013   Procedure: RIGHT FRONTAL CRANIOTOMY FOR TUMOR RESECTION ;  Surgeon: Consuella Lose, MD;  Location: [place] NEURO ORS;  Service: Neurosurgery;  Laterality: Right;  . CRANIOTOMY Right 02/17/2015   Procedure: Right frontal parietal craniotomy for resection of meningioma with brainlab;  Surgeon: Consuella Lose, MD;  Location: [place] NEURO ORS;  Service: Neurosurgery;  Laterality: Right;  Right frontal parietal craniotomy for resection of meningioma with brainlab  . JOINT REPLACEMENT     right knee replacement  . LAPAROTOMY N/A 04/04/2016   Procedure: EXPLORATORY LAPAROTOMY, REPAIR OF GASTRIC PERFORATION WITH OMENTAL PATCH, PLACEMENT OF  GASTROSTOMY TUBE;  Surgeon: Autumn Messing III, MD;  Location: [facility] ORS;  Service: General;  Laterality: N/A;  . PROSTATE BIOPSY    . TONSILLECTOMY     Agae 6-7  . VASECTOMY      Family History  Problem Relation Age of Onset  . Parkinsonism Mother   . Dementia Father     Social History:  reports that he has never smoked. He quit smokeless tobacco use about 42 years ago. His smokeless tobacco use included Chew. He reports that he does not drink alcohol or use drugs.  Allergies:  Allergies  Allergen Reactions  . Asa [Aspirin] Nausea Only  . Other Itching and Rash    Nuts  . Vicodin [Hydrocodone-Acetaminophen] Nausea Only    Medications: Prior to Admission:  (Not in a hospital admission)  Results for orders placed or performed during the hospital encounter of 04/23/16 (from the past 48 hour(s))  Comprehensive metabolic panel     Status: Abnormal   [name] Time: 04/23/16  4:21 AM  Result Value Ref Range   Sodium 135 135 - 145 mmol/L   Potassium 3.8 3.5 - 5.1 mmol/L   Chloride 100 (L) 101 - 111 mmol/L   CO2 24 22 - 32 mmol/L   Glucose, Bld 110 (H) 65 - 99 mg/dL   BUN 29 (H) 6 - 20 mg/dL   Creatinine, Ser 1.08 0.61 - 1.24 mg/dL   Calcium 8.7 (L) 8.9 - 10.3 mg/dL  Total Protein 5.8 (L) 6.5 - 8.1 g/dL   Albumin 2.4 (L) 3.5 - 5.0 g/dL   AST 31 15 - 41 U/L   ALT 30 17 - 63 U/L   Alkaline Phosphatase 72 38 - 126 U/L   Total Bilirubin 0.5 0.3 - 1.2 mg/dL   GFR calc non Af Amer >60 >60 mL/min   GFR calc Af Amer >60 >60 mL/min    Comment: (NOTE) The eGFR has been calculated using the CKD EPI equation. This calculation has not been validated in all clinical situations. eGFR's persistently <60 mL/min signify possible Chronic Kidney Disease.    Anion gap 11 5 - 15  CBC WITH DIFFERENTIAL     Status: Abnormal   [name] Time: 04/23/16  4:21 AM  Result Value Ref Range   WBC 14.2 (H) 4.0 - 10.5 K/uL   RBC 4.76 4.22 - 5.81 MIL/uL   Hemoglobin 13.4 13.0 - 17.0 g/dL   HCT 43.6 39.0  - 52.0 %   MCV 91.6 78.0 - 100.0 fL   MCH 28.2 26.0 - 34.0 pg   MCHC 30.7 30.0 - 36.0 g/dL   RDW 18.9 (H) 11.5 - 15.5 %   Platelets 277 150 - 400 K/uL   Neutrophils Relative % 82 %   Neutro Abs 11.6 (H) 1.7 - 7.7 K/uL   Lymphocytes Relative 7 %   Lymphs Abs 1.0 0.7 - 4.0 K/uL   Monocytes Relative 11 %   Monocytes Absolute 1.5 (H) 0.1 - 1.0 K/uL   Eosinophils Relative 0 %   Eosinophils Absolute 0.0 0.0 - 0.7 K/uL   Basophils Relative 0 %   Basophils Absolute 0.0 0.0 - 0.1 K/uL  Type and screen     Status: None   [name] Time: 04/23/16  4:21 AM  Result Value Ref Range   ABO/RH(D) O POS    Antibody Screen NEG    Sample Expiration 04/26/2016   POC occult blood, ED RN will collect     Status: Abnormal   [name] Time: 04/23/16  4:41 AM  Result Value Ref Range   Fecal Occult Bld POSITIVE (A) NEGATIVE  I-Stat CG4 Lactic Acid, ED  (not at  [facility])     Status: Abnormal   [name] Time: 04/23/16  4:45 AM  Result Value Ref Range   Lactic Acid, Venous 2.45 (HH) 0.5 - 1.9 mmol/L   Comment NOTIFIED PHYSICIAN   I-stat chem 8, ed     Status: Abnormal   [name] Time: 04/23/16  4:45 AM  Result Value Ref Range   Sodium 136 135 - 145 mmol/L   Potassium 3.7 3.5 - 5.1 mmol/L   Chloride 99 (L) 101 - 111 mmol/L   BUN 30 (H) 6 - 20 mg/dL   Creatinine, Ser 1.00 0.61 - 1.24 mg/dL   Glucose, Bld 106 (H) 65 - 99 mg/dL   Calcium, Ion 1.08 (L) 1.12 - 1.23 mmol/L   TCO2 27 0 - 100 mmol/L   Hemoglobin 14.6 13.0 - 17.0 g/dL   HCT 43.0 39.0 - 52.0 %  CBC     Status: Abnormal   [name] Time: 04/23/16  7:23 AM  Result Value Ref Range   WBC 13.5 (H) 4.0 - 10.5 K/uL   RBC 4.77 4.22 - 5.81 MIL/uL   Hemoglobin 13.5 13.0 - 17.0 g/dL   HCT 43.2 39.0 - 52.0 %   MCV 90.6 78.0 - 100.0 fL   MCH 28.3 26.0 - 34.0 pg   MCHC 31.3 30.0 - 36.0  g/dL   RDW 18.9 (H) 11.5 - 15.5 %   Platelets 239 150 - 400 K/uL  I-Stat CG4 Lactic Acid, ED  (not at  [facility])     Status: Abnormal   [name] Time: 04/23/16   7:31 AM  Result Value Ref Range   Lactic Acid, Venous 2.44 (HH) 0.5 - 1.9 mmol/L   Comment NOTIFIED PHYSICIAN     Ct Abdomen Pelvis W Contrast  Result Date: 04/23/2016 CLINICAL DATA:  Discharge from hospital on July 28th after abdominal surgery. Now with sharp pains at surgical site. Hematemesis last night. Status post recent laparotomy with repair of gastric perforation and placement of gastrostomy tube. EXAM: CT ABDOMEN AND PELVIS WITH CONTRAST TECHNIQUE: Multidetector CT imaging of the abdomen and pelvis was performed using the standard protocol following bolus administration of intravenous contrast. CONTRAST:  116m ISOVUE-300 IOPAMIDOL (ISOVUE-300) INJECTION 61% COMPARISON:  CT abdomen status 04/04/2016 09/11/2015. FINDINGS: Lower chest: Small left pleural effusion and mild bibasilar atelectasis. Multiple acute appearing pulmonary emboli are seen within the segmental pulmonary artery branches to the right lower lobe. Hepatobiliary: Small layering stones within the otherwise normal-appearing gallbladder. Liver appears normal. Pancreas: Partially infiltrated with fat but otherwise unremarkable. Spleen: Within normal limits in size and appearance. Adrenals/Urinary Tract: Adrenal glands appear normal. Kidneys appear normal without mass, stone or hydronephrosis. No ureteral or bladder calculi identified. Bladder appears normal. Stomach/Bowel: Small hiatal hernia, significantly improved in appearance compared to the recent CT of 04/04/2016. There is a circumscribed fluid [name] within the lower mediastinum, just to the left of the anterior margin of the lower esophagus, measuring 2.7 x 1.6 cm, suspicious for early developing abscess. Additional smaller less well circumscribed low-density [name] is seen adjacent to the stomach cardia, measuring approximately 2.6 x 1.5 cm, possible additional early abscess formation. No dilated large or small bowel loops. Gastrostomy tube appears well positioned.  Scattered diverticulosis within the descending and sigmoid colon without evidence of acute diverticulitis. Vascular/Lymphatic: Scattered atherosclerotic changes of the normal caliber abdominal aorta. No enlarged lymph nodes seen. Reproductive: No mass or other significant abnormality. Other: No free intraperitoneal air. Musculoskeletal: Degenerative changes throughout the slightly scoliotic thoracolumbar spine. No acute or suspicious osseous finding. IMPRESSION: 1. Interval surgical changes of the stomach, compatible with the given history of recent repair of gastric perforation and placement of gastrostomy tube. Circumscribed fluid density [name] within the lower mediastinum, chest to the left of the anterior margin of the lower esophagus, measuring 2.7 x 1.6 cm, possibly expected postoperative fluid [name] but suspicious for early developing abscess [name]. Associated perforation cannot be excluded, but no extraluminal air to confirm. 2. Poorly defined low-density [name] adjacent to the stomach cardia, measuring approximately 2.6 x 1.5 cm, expected postsurgical fluid versus additional early developing abscess. 3. Acute appearing pulmonary emboli within segmental pulmonary artery branches to the right lower lobe. Would consider CT pulmonary angiogram for more complete characterization of the pulmonary arteries. 4. Gastrostomy tube appears well positioned. 5. No bowel obstruction.  No free intraperitoneal air. 6. Colonic diverticulosis without evidence of acute diverticulitis. 7. Cholelithiasis without evidence of acute cholecystitis. 8. Aortic atherosclerosis. 9. Small left pleural effusion. Critical Value/emergent results were called by telephone at the time of interpretation on 04/23/2016 at 12:22 pm to Dr. PJohnney Killian who verbally acknowledged these results. Electronically Signed   By: SFranki CabotM.D.   On: 04/23/2016 12:24   Dg Chest Port 1 View  Result Date: 04/23/2016 CLINICAL DATA:   82year old male with cough and hypotension EXAM:  PORTABLE CHEST 1 VIEW COMPARISON:  Chest radiograph dated 04/08/2016 FINDINGS: Single portable view of chest demonstrates emphysematous changes of the lungs. There is mild eventration of the left hemidiaphragm. No focal consolidation, pleural effusion, or pneumothorax. Stable cardiac silhouette. Osteopenia with degenerative changes of the shoulders. No acute osseous pathology. IMPRESSION: No active disease. Electronically Signed   By: Anner Crete M.D.   On: 04/23/2016 04:56    ROS Blood pressure 127/73, pulse 82, resp. rate 17, SpO2 95 %. Physical Exam  Constitutional: He is oriented to person, place, and time. He appears well-developed.  Obese and deconditioned  HENT:  Head: Normocephalic.  Eyes: Conjunctivae are normal. Pupils are equal, round, and reactive to light.  Neck: Normal range of motion. Neck supple.  Cardiovascular: Normal rate and normal heart sounds.   Respiratory: Effort normal and breath sounds normal.  GI: Soft. Normal appearance and bowel sounds are normal. There is tenderness (around the G-tube site).    Neurological: He is alert and oriented to person, place, and time. He has normal reflexes.  Skin: Skin is warm and dry.  Psychiatric: He has a normal mood and affect. His behavior is normal. Judgment and thought content normal.    Assessment/Plan: Status post reapir of gastric perforation on 7/17 and the patient has been in a SNF since discharge.  Came In today because of coughing up blood.  CT demonstrated PE although it was done to look for intra-abdominal problems.  The relatively small fluid [name] up near the distal esophagus probably cannot be access percutaneously, but surgery in this patient is out of the question, but I would not recommend surgery at this time based on the current findings.  He has a lot of irritation around his G-tube site.  The G-tube was placed to tether the stomach because of his  hiatal hernia.  He needs a larger tube with a larger balloon to try to stop the leakage.   His lactic acid level is elevated, but his BUN is 30 with a creatinine of 1.  I believe he is significantly volume contracted and needs to be rehydrated.  Vlad Mayberry 04/23/2016, 1:05 PM

## 2016-04-23 NOTE — ED Notes (Signed)
Scanner not picking up zosyn. RN on [place] notified and will scan zosyn. Zosyn started at 12.47mL/hr at this time.

## 2016-04-23 NOTE — ED Notes (Signed)
Sat patient up and he attempted to urinate with assistance x2. Pt. Unable to urinate at this time. RN noted patient to have yeast around his penis. Pericare done at that time.

## 2016-04-23 NOTE — ED Notes (Signed)
Pt. Noted to have brown and red drainage from drain in left lower abdomen. RN changed dry dressing, gown, and blankets at this time. EDP made aware

## 2016-04-23 NOTE — ED Triage Notes (Signed)
Pt arrives by [facility] from [place] with c/o hypotension and emesis. Facility reported 2 episodes of dark emesis and hypotension. EMS reports BP WDL upon arrival, then varying results en route (110/70 in left arm, 88/40 in right arm) as well as variations in pedal pulses. Pt given 570mL bolus after which EMS states BPs evened out. Pt also c/o burning in all 4 abdominal quads. Pt had hernia repair recently and has a wound vac. 12 lead unremarkable.

## 2016-04-23 NOTE — ED Provider Notes (Signed)
Patient was accepted at signout from Dr. Lenoria Chime. Plan was to follow-up on CT abdomen and make final disposition. Patient did not have acute pain complaints. He did have significant drainage from his gastrostomy tube and abdominal wall chemical dermatitis. The patient is significantly deconditioned. He did however remain alert and interactive. CT result revealed small areas per radiology of possible isolated abscess toward the upper abdomen around the esophagus. Also possible residual postoperative changes. Also identified were lower pulmonary emboli.  I did consult Dr. Hulen Skains who evaluated the patient in the emergency department and determined his management would be medical with antibiotics and admission to medical service for associated medical comorbidities including newly diagnosed PE.Marland Kitchen He did repair the gastrostomy tube for leakage and redressed the area.   Charlesetta Shanks, MD 04/24/16 [phone]

## 2016-04-23 NOTE — ED Notes (Signed)
Pt. Transported to CT at this time.  

## 2016-04-23 NOTE — Progress Notes (Signed)
ANTICOAGULATION CONSULT NOTE - Initial Consult  Pharmacy Consult for heparin Indication: pulmonary embolus  Allergies  Allergen Reactions  . Asa [Aspirin] Nausea Only  . Other Itching and Rash    Nuts  . Vicodin [Hydrocodone-Acetaminophen] Nausea Only    Patient Measurements:   Heparin Dosing Weight: 85 kg  Vital Signs: BP: 128/68 (08/05 1400) Pulse Rate: 81 (08/05 1400)  Labs:  Recent Labs  04/23/16 0421 04/23/16 0445 04/23/16 0723 04/23/16 1337  HGB 13.4 14.6 13.5  --   HCT 43.6 43.0 43.2  --   PLT 277  --  239  --   LABPROT  --   --   --  14.3  INR  --   --   --  1.10  CREATININE 1.08 1.00  --   --     Estimated Creatinine Clearance: 61.4 mL/min (by C-G formula based on SCr of 1 mg/dL).  Assessment: 82 yo m presenting with N/V and hypotension  PMH: seizures, prostate ca, HTN, HLD, GERD  AC: none pta  - hep gtt for r/o PE  Renal: SCr 1  Pulm: CTA to r/o PE  Heme: H&H 13.5/43.2, Plt 239  Goal of Therapy:  Heparin level 0.3-0.7 units/ml Monitor platelets by anticoagulation protocol: Yes   Plan:  Heparin bolus 5000 units Heparin gtt 1400 units/hr 0000 HL Daily HL, CBC Monitor s/sx of bleeding  Levester Fresh, PharmD, BCPS, [facility] Clinical Pharmacist Pager [phone] 04/23/2016 3:28 PM

## 2016-04-23 NOTE — ED Notes (Signed)
Miquel Dunn place RN called and asked for update on patient.

## 2016-04-23 NOTE — ED Notes (Addendum)
Pt. Returned from CT with loss of IV access. RN started new IV and contact CT at this time to be taken back for his scan. EDP made aware of delay in care.

## 2016-04-23 NOTE — ED Provider Notes (Signed)
[facility] DEPT Provider Note   CSN: [id number] Arrival date & time: 04/23/16  [id number]  First Provider Contact:  First MD Initiated Contact with Patient 04/23/16 0340   By signing my name below, I, [name], attest that this documentation has been prepared under the direction and in the presence of physician practitioner, Varney Biles, MD. Electronically Signed: [name], Scribe. 04/23/2016. 3:40 AM.  History   Chief Complaint Chief Complaint  Patient presents with  . Hypotension  . Emesis    The history is provided by the patient. No language interpreter was used.     HPI Comments: [name] is a 82 y.o. male brought in by EMS who presents to the Emergency Department complaining of sudden onset vomiting beginning last night. Pt states that contents of his emesis were brown. He reports he had bowel movements yesterday and has been passing gas. Pt states he has been experiencing acid reflux for the past couple of days.He also notes a cough for the past several days. Pt had a perforated gastric ulcer that was repaired a week ago and has been residing at [facility]; he lived at home prior to the surgery. He reports that he has felt weak and dizzy upon standing since the surgery and feels like he is going to fall. He denies abdominal pain, nausea, melena, or any other associated symptoms at the moment. Does state that he feels a bit distended.  Past Medical History:  Diagnosis Date  . Anemia   . Aortic insufficiency 08/11/2015  . Basal cell cancer   . Brain tumor ([place])   . GERD (gastroesophageal reflux disease)   . Herniated disc    lumbar spine  . Hiatal hernia   . History of radiation therapy    back approximately 2010 prostate radiation under care of Dr. Valere Dross   . Hyperlipidemia   . Hypertension   . Meningioma ([place])    brain  . Nocturia   . Occult blood in stools   . OSA (obstructive sleep apnea) 10/28/2014   Moderate with AHI 21/hr  .  Prostate cancer ([place])   . Restless leg   . Seizures [facility])     Patient Active Problem List   Diagnosis Date Noted  . Cough 04/22/2016  . Acute delirium 04/08/2016  . Perforated bowel ([place]) 04/04/2016  . Humerus fracture 12/31/2015  . Closed right humeral fracture 12/31/2015  . Aortic insufficiency 08/11/2015  . 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 Obesity (BMI 30-39.9) 02/13/2015  . OSA (obstructive sleep apnea) 10/28/2014  . SOB (shortness of breath) 07/31/2014  . Fatigue due to excessive exertion 07/31/2014  . Edema extremities 07/31/2014  . Hypersomnia 07/31/2014  . Meningioma, recurrent of brain ([place]) 09/27/2013  . Unspecified constipation 09/26/2013  . Anemia, iron deficiency 09/26/2013  . Dizziness 09/20/2013  . Fall at home 09/20/2013  . Dehydration 09/20/2013  . Orthostasis 09/20/2013  . CKD (chronic kidney disease), stage III 09/20/2013  . Brain mass 09/20/2013  . Gait instability 08/20/2013  . Hypokalemia 10/09/2012  . Ileus ([place]) 10/08/2012  . Acute kidney injury ([place]) 10/08/2012  . Hypertension 10/08/2012  . Hyperlipidemia 10/08/2012  . GERD (gastroesophageal reflux disease) 10/08/2012  . Restless leg syndrome 10/08/2012    Past Surgical History:  Procedure Laterality Date  . APPLICATION OF CRANIAL NAVIGATION N/A 02/17/2015   Procedure: APPLICATION  OF CRANIAL NAVIGATION;  Surgeon: Consuella Lose, MD;  Location: [facility] NEURO ORS;  Service: Neurosurgery;  Laterality: N/A;  APPLICATION OF CRANIAL NAVIGATION  . Bilateral cateract surgery    . COLONOSCOPY     colon polyp removed  . CRANIOTOMY Right 09/27/2013   Procedure: RIGHT FRONTAL CRANIOTOMY FOR TUMOR RESECTION ;  Surgeon: Consuella Lose, MD;  Location: [facility] NEURO ORS;  Service: Neurosurgery;  Laterality: Right;  .  CRANIOTOMY Right 02/17/2015   Procedure: Right frontal parietal craniotomy for resection of meningioma with brainlab;  Surgeon: Consuella Lose, MD;  Location: [facility] NEURO ORS;  Service: Neurosurgery;  Laterality: Right;  Right frontal parietal craniotomy for resection of meningioma with brainlab  . JOINT REPLACEMENT     right knee replacement  . LAPAROTOMY N/A 04/04/2016   Procedure: EXPLORATORY LAPAROTOMY, REPAIR OF GASTRIC PERFORATION WITH OMENTAL PATCH, PLACEMENT OF GASTROSTOMY TUBE;  Surgeon: Autumn Messing III, MD;  Location: [facility] ORS;  Service: General;  Laterality: N/A;  . PROSTATE BIOPSY    . TONSILLECTOMY     Agae 6-7  . VASECTOMY         Home Medications    Prior to Admission medications   Medication Sig Start Date End Date Taking? Authorizing Provider  acetaminophen (TYLENOL) 325 MG tablet Take 2 tablets (650 mg total) by mouth every 4 (four) hours as needed. Patient taking differently: Take 650 mg by mouth every 4 (four) hours as needed for mild pain.  04/15/16 04/15/17 Yes Earnstine Regal, PA-C  albuterol (PROVENTIL) (2.5 MG/3ML) 0.083% nebulizer solution Take 3 mLs (2.5 mg total) by nebulization every 4 (four) hours as needed for wheezing or shortness of breath. 04/15/16  Yes Earnstine Regal, PA-C  b complex vitamins tablet Take 1 tablet by mouth daily.   Yes Historical Provider, MD  dexamethasone (DECADRON) 0.5 MG tablet Take 0.5 mg by mouth daily. Stop date 05/13/16   Yes Historical Provider, MD  dexamethasone (DECADRON) 1 MG tablet Take 1 mg by mouth daily. Stop date 05/06/16   Yes Historical Provider, MD  dexamethasone (DECADRON) 2 MG tablet Take 2 mg by mouth daily. Stop date 04/29/16   Yes Historical Provider, MD  famotidine (PEPCID) 20 MG tablet Take 1 tablet (20 mg total) by mouth 2 (two) times daily. 04/15/16  Yes Earnstine Regal, PA-C  ferrous sulfate 325 (65 FE) MG tablet Take 1,300 mg by mouth every morning.    Yes Historical Provider, MD  irbesartan (AVAPRO) 75 MG tablet  Take 75 mg by mouth daily.   Yes Historical Provider, MD  lamoTRIgine (LAMICTAL) 100 MG tablet Take 1 tablet (100 mg total) by mouth 2 (two) times daily. 04/18/16  Yes Cameron Sprang, MD  levETIRAcetam (KEPPRA) 500 MG tablet Take 1 & 1/2 tablets twice a day 12/01/15  Yes Cameron Sprang, MD  multivitamin-lutein [facility]) CAPS capsule Take 1 capsule by mouth daily.   Yes Historical Provider, MD  nystatin cream (MYCOSTATIN) Apply 1 application topically 2 (two) times daily.   Yes Historical Provider, MD  ondansetron (ZOFRAN-ODT) 4 MG disintegrating tablet Take 1 tablet (4 mg total) by mouth every 6 (six) hours as needed for nausea. 04/15/16  Yes Earnstine Regal, PA-C  rOPINIRole (REQUIP) 3 MG tablet Take 1.5 mg by mouth 2 (two) times daily.   Yes Historical Provider, MD  solifenacin (VESICARE) 5 MG tablet Take 1 tablet (5 mg total) by mouth daily. 10/18/13  Yes Daniel J Angiulli, PA-C  tamsulosin (FLOMAX) 0.4 MG CAPS capsule Take 1 capsule (0.4 mg  total) by mouth daily after supper. 10/18/13  Yes Daniel J Angiulli, PA-C  vitamin B-12 (CYANOCOBALAMIN) 100 MCG tablet Take 100 mcg by mouth daily.    Yes Historical Provider, MD    Family History Family History  Problem Relation Age of [facility]  . Parkinsonism Mother   . Dementia Father     Social History Social History  Substance Use Topics  . Smoking status: Never Smoker  . Smokeless tobacco: Former Systems developer    Types: Chew    Quit date: 10/08/1973  . Alcohol use No     Allergies   Asa [aspirin]; Other; and Vicodin [hydrocodone-acetaminophen]   Review of Systems Review of Systems  A complete 10 system review of systems was obtained and all systems are negative except as noted in the HPI and PMH.   Physical Exam Updated Vital Signs BP 100/63   Pulse 93   Resp 21   SpO2 94%   Physical Exam  Constitutional: He is oriented to person, place, and time. He appears well-developed and well-nourished. No distress.  HENT:  Head:  Normocephalic and atraumatic.  Eyes: Conjunctivae and EOM are normal.  Neck: Neck supple. No tracheal deviation present.  Cardiovascular: Regular rhythm.  Tachycardia present.   Pulmonary/Chest: Effort normal. No respiratory distress.  Anterior lungs are clear.  Abdominal: Soft. He exhibits distension. There is no tenderness. There is no guarding.  Periumbilical wound vac with overlying dressing and a G tube in the LUQ which has dark, tarry appearing drainage surrounding it. Surrounding the gastric tube there is skin erythema and irritation. Abdomen is soft otherwise, no tenderness to palpation.  Musculoskeletal: Normal range of motion.  Neurological: He is alert and oriented to person, place, and time.  Skin: Skin is warm and dry.  Psychiatric: He has a normal mood and affect. His behavior is normal.  Nursing note and vitals reviewed.    ED Treatments / Results  Labs (all labs ordered are listed, but only abnormal results are displayed) Labs Reviewed  COMPREHENSIVE METABOLIC PANEL - Abnormal; Notable for the following:       Result Value   Chloride 100 (*)    Glucose, Bld 110 (*)    BUN 29 (*)    Calcium 8.7 (*)    Total Protein 5.8 (*)    Albumin 2.4 (*)    All other components within normal limits  CBC WITH DIFFERENTIAL/PLATELET - Abnormal; Notable for the following:    WBC 14.2 (*)    RDW 18.9 (*)    Neutro Abs 11.6 (*)    Monocytes Absolute 1.5 (*)    All other components within normal limits  I-STAT CG4 LACTIC ACID, ED - Abnormal; Notable for the following:    Lactic Acid, Venous 2.45 (*)    All other components within normal limits  POC OCCULT BLOOD, ED - Abnormal; Notable for the following:    Fecal Occult Bld POSITIVE (*)    All other components within normal limits  I-STAT CHEM 8, ED - Abnormal; Notable for the following:    Chloride 99 (*)    BUN 30 (*)    Glucose, Bld 106 (*)    Calcium, Ion 1.08 (*)    All other components within normal limits  I-STAT CG4  LACTIC ACID, ED - Abnormal; Notable for the following:    Lactic Acid, Venous 2.44 (*)    All other components within normal limits  CULTURE, BLOOD (ROUTINE X 2)  CULTURE, BLOOD (ROUTINE X 2)  URINE  CULTURE  URINALYSIS, ROUTINE W REFLEX MICROSCOPIC (NOT AT [facility])  TYPE AND SCREEN    EKG  EKG Interpretation None       Radiology Dg Chest Port 1 View  Result Date: 04/23/2016 CLINICAL DATA:  82 year old male with cough and hypotension EXAM: PORTABLE CHEST 1 VIEW COMPARISON:  Chest radiograph dated 04/08/2016 FINDINGS: Single portable view of chest demonstrates emphysematous changes of the lungs. There is mild eventration of the left hemidiaphragm. No focal consolidation, pleural effusion, or pneumothorax. Stable cardiac silhouette. Osteopenia with degenerative changes of the shoulders. No acute osseous pathology. IMPRESSION: No active disease. Electronically Signed   By: Anner Crete M.D.   On: 04/23/2016 04:56    Procedures Procedures (including critical care time)  DIAGNOSTIC STUDIES: Oxygen Saturation is 98% on RA, normal by my interpretation.    COORDINATION OF CARE: 3:42 AM Discussed treatment plan with pt at bedside and pt agreed to plan.   Medications Ordered in ED Medications  famotidine (PEPCID) IVPB 20 mg premix (not administered)  piperacillin-tazobactam (ZOSYN) IVPB 3.375 g (3.375 g Intravenous New Bag/Given 04/23/16 0602)  piperacillin-tazobactam (ZOSYN) IVPB 3.375 g (not administered)  diatrizoate meglumine-sodium (GASTROGRAFIN) 66-10 % solution (not administered)  iopamidol (ISOVUE-300) 61 % injection (not administered)  sodium chloride 0.9 % bolus 1,000 mL (1,000 mLs Intravenous New Bag/Given 04/23/16 0507)    And  sodium chloride 0.9 % bolus 1,000 mL (1,000 mLs Intravenous New Bag/Given 04/23/16 0508)  ondansetron (ZOFRAN) injection 4 mg (4 mg Intravenous Given 04/23/16 0508)     Initial Impression / Assessment and Plan / ED Course  I have reviewed the triage  vital signs and the nursing notes.  Pertinent labs & imaging results that were available during my care of the patient were reviewed by me and considered in my medical decision making (see chart for details).  Clinical Course    I personally performed the services described in this documentation, which was scribed in my presence. The recorded information has been reviewed and is accurate.  Pt comes in with emesis. He was hypotensive - 70s SBP per EMS when they arrived. SBP was in the 90s when I saw him. Pt has no SIRs on vitals and he has no fever. Pt feels better at the moment. Labs ordered to ensure there is no acute upper gi bleed or sepsis. CT abdomen ordered as well. Lactate is slightly elevated. BP has improved dramatically. Unlikely sepsis and code sepsis was never called, pt did receive antibiotics whilst workup was going on. Hb is normal.  Dr. Lonie Peak to take over the care at 8:15 am. CT scan pending at this time. Surgery would be consulted as a courtesy post imaging given the recent surgery..... But from the ER perspective anticipate discharge if CT is neg.   Final Clinical Impressions(s) / ED Diagnoses   Final diagnoses:  None    New Prescriptions New Prescriptions   No medications on file     Varney Biles, MD 04/23/16 [phone]

## 2016-04-24 ENCOUNTER — Inpatient Hospital Stay (HOSPITAL_COMMUNITY): Payer: Medicare Other

## 2016-04-24 DIAGNOSIS — Z92241 Personal history of systemic steroid therapy: Secondary | ICD-10-CM

## 2016-04-24 DIAGNOSIS — E785 Hyperlipidemia, unspecified: Secondary | ICD-10-CM

## 2016-04-24 DIAGNOSIS — R55 Syncope and collapse: Secondary | ICD-10-CM

## 2016-04-24 DIAGNOSIS — I10 Essential (primary) hypertension: Secondary | ICD-10-CM

## 2016-04-24 DIAGNOSIS — K219 Gastro-esophageal reflux disease without esophagitis: Secondary | ICD-10-CM

## 2016-04-24 LAB — ECHOCARDIOGRAM COMPLETE
AVLVOTPG: 4 mmHg
CHL CUP DOP CALC LVOT VTI: 23.9 cm
CHL CUP MV DEC (S): 246
E decel time: 246 msec
FS: 42 % (ref 28–44)
Height: 78 in
IV/PV OW: 1
LADIAMINDEX: 1.7 cm/m2
LASIZE: 38 mm
LAVOLA4C: 43.5 mL
LDCA: 2.01 cm2
LEFT ATRIUM END SYS DIAM: 38 mm
LV PW d: 11 mm — AB (ref 0.6–1.1)
LV TDI E'LATERAL: 7.18
LV TDI E'MEDIAL: 6.2
LV e' LATERAL: 7.18 cm/s
LVOT SV: 48 mL
LVOT diameter: 16 mm
LVOT peak vel: 95.3 cm/s
Lateral S' vel: 14.4 cm/s
MV pk E vel: 1.1 m/s
TAPSE: 24.4 mm
Weight: 3152 oz

## 2016-04-24 LAB — BASIC METABOLIC PANEL
Anion gap: 11 (ref 5–15)
BUN: 15 mg/dL (ref 6–20)
CALCIUM: 8.2 mg/dL — AB (ref 8.9–10.3)
CO2: 23 mmol/L (ref 22–32)
CREATININE: 0.91 mg/dL (ref 0.61–1.24)
Chloride: 101 mmol/L (ref 101–111)
GFR calc non Af Amer: >60 mL/min (ref >60)
Glucose, Bld: 103 mg/dL — ABNORMAL HIGH (ref 65–99)
Potassium: 4.2 mmol/L (ref 3.5–5.1)
SODIUM: 135 mmol/L (ref 135–145)

## 2016-04-24 LAB — CBC
HCT: 36.7 % — ABNORMAL LOW (ref 39.0–52.0)
Hemoglobin: 11.1 g/dL — ABNORMAL LOW (ref 13.0–17.0)
MCH: 27.8 pg (ref 26.0–34.0)
MCHC: 30.2 g/dL (ref 30.0–36.0)
MCV: 92 fL (ref 78.0–100.0)
PLATELETS: 283 10*3/uL (ref 150–400)
RBC: 3.99 MIL/uL — AB (ref 4.22–5.81)
RDW: 19.1 % — AB (ref 11.5–15.5)
WBC: 10.8 10*3/uL — AB (ref 4.0–10.5)

## 2016-04-24 LAB — URINE CULTURE

## 2016-04-24 LAB — HEPARIN LEVEL (UNFRACTIONATED)
HEPARIN UNFRACTIONATED: 0.7 [IU]/mL (ref 0.30–0.70)
Heparin Unfractionated: 0.9 IU/mL — ABNORMAL HIGH (ref 0.30–0.70)

## 2016-04-24 MED ORDER — GUAIFENESIN 100 MG/5ML PO SOLN
5.0000 mL | ORAL | Status: DC | PRN
  Administered 2016-04-24 – 2016-04-28 (×4): 100 mg via ORAL
  Filled 2016-04-24 (×2): qty 5
  Filled 2016-04-24: qty 25
  Filled 2016-04-24: qty 5

## 2016-04-24 MED ORDER — FAMOTIDINE 20 MG PO TABS
20.0000 mg | ORAL_TABLET | Freq: Two times a day (BID) | ORAL | Status: DC
  Administered 2016-04-24 – 2016-04-30 (×12): 20 mg via ORAL
  Filled 2016-04-24 (×12): qty 1

## 2016-04-24 MED ORDER — SENNOSIDES-DOCUSATE SODIUM 8.6-50 MG PO TABS
1.0000 | ORAL_TABLET | Freq: Two times a day (BID) | ORAL | Status: DC
  Administered 2016-04-24 – 2016-04-30 (×12): 1 via ORAL
  Filled 2016-04-24 (×12): qty 1

## 2016-04-24 MED ORDER — BENZONATATE 100 MG PO CAPS
100.0000 mg | ORAL_CAPSULE | Freq: Three times a day (TID) | ORAL | Status: DC
  Administered 2016-04-24 – 2016-04-30 (×19): 100 mg via ORAL
  Filled 2016-04-24 (×20): qty 1

## 2016-04-24 MED ORDER — POLYETHYLENE GLYCOL 3350 17 G PO PACK
17.0000 g | PACK | Freq: Once | ORAL | Status: AC
  Administered 2016-04-24: 17 g via ORAL
  Filled 2016-04-24: qty 1

## 2016-04-24 MED ORDER — FLUCONAZOLE IN SODIUM CHLORIDE 200-0.9 MG/100ML-% IV SOLN
200.0000 mg | Freq: Once | INTRAVENOUS | Status: AC
  Administered 2016-04-25: 200 mg via INTRAVENOUS
  Filled 2016-04-24: qty 100

## 2016-04-24 MED ORDER — ROPINIROLE HCL 1 MG PO TABS
3.0000 mg | ORAL_TABLET | Freq: Once | ORAL | Status: AC
  Administered 2016-04-24: 3 mg via ORAL
  Filled 2016-04-24: qty 3

## 2016-04-24 MED ORDER — PANTOPRAZOLE SODIUM 40 MG PO TBEC
40.0000 mg | DELAYED_RELEASE_TABLET | Freq: Every day | ORAL | Status: DC
  Administered 2016-04-24 – 2016-04-30 (×7): 40 mg via ORAL
  Filled 2016-04-24 (×7): qty 1

## 2016-04-24 MED ORDER — GI COCKTAIL ~~LOC~~
30.0000 mL | Freq: Three times a day (TID) | ORAL | Status: DC | PRN
  Administered 2016-04-24: 30 mL via ORAL
  Filled 2016-04-24 (×3): qty 30

## 2016-04-24 NOTE — Progress Notes (Signed)
[facility] for heparin Indication: pulmonary embolus  Allergies  Allergen Reactions  . Asa [Aspirin] Nausea Only  . Other Itching and Rash    Nuts  . Vicodin [Hydrocodone-Acetaminophen] Nausea Only    Patient Measurements: Height: 6\' 6"  (198.1 cm) Weight: 199 lb (90.3 kg) IBW/kg (Calculated) : 91.4 Heparin Dosing Weight: 85 kg  Vital Signs: Temp: 98.2 F (36.8 C) (08/05 2345) Temp Source: Oral (08/05 2345) BP: 131/66 (08/05 2345) Pulse Rate: 79 (08/05 2345)  Labs:  Recent Labs  04/23/16 0421 04/23/16 0445 04/23/16 0723 04/23/16 1337 04/23/16 2353  HGB 13.4 14.6 13.5  --   --   HCT 43.6 43.0 43.2  --   --   PLT 277  --  239  --   --   LABPROT  --   --   --  14.3  --   INR  --   --   --  1.10  --   HEPARINUNFRC  --   --   --   --  0.90*  CREATININE 1.08 1.00  --   --   --     Estimated Creatinine Clearance: 71.5 mL/min (by C-G formula based on SCr of 1 mg/dL).  Assessment: 82 y.o. male with PE for heparin  Goal of Therapy:  Heparin level 0.3-0.7 units/ml Monitor platelets by anticoagulation protocol: Yes   Plan:  Decrease Heparin 1250 units/hr Follow-up am labs.   Phillis Knack, PharmD, BCPS  04/24/2016 12:28 AM

## 2016-04-24 NOTE — Progress Notes (Addendum)
Progress Note: General Surgery Service   Subjective: Tolerated clears, still feeling lightheaded on standing  Objective: Vital signs in last 24 hours: Temp:  [98 F (36.7 C)-98.3 F (36.8 C)] 98 F (36.7 C) (08/06 0749) Pulse Rate:  [69-94] 69 (08/06 0749) Resp:  [16-22] 18 (08/06 0749) BP: (113-132)/(63-75) 132/74 (08/06 0339) SpO2:  [93 %-98 %] 96 % (08/06 0339) Weight:  [89.4 kg (197 lb)-90.3 kg (199 lb)] 89.4 kg (197 lb) (08/06 0406) Last BM Date: 04/22/16  Intake/Output from previous day: 08/05 0701 - 08/06 0700 In: 1334.2 [P.O.:360; I.V.:924.2; IV Piggyback:50] Out: 350 [Urine:350] Intake/Output this shift: Total I/O In: 930.9 [P.O.:400; I.V.:380.9; IV Piggyback:150] Out: -   Lungs: CTAB  Cardiovascular: RRR  Abd: soft, NT, epigastric vac in place, g tube in place with some leakage, wafer placed to act as barrier to skin  Extremities: no edema  Neuro: AOx4  Lab Results: CBC   Recent Labs  04/23/16 0723 04/24/16 0519  WBC 13.5* 10.8*  HGB 13.5 11.1*  HCT 43.2 36.7*  PLT 239 283   BMET  Recent Labs  04/23/16 0421 04/23/16 0445 04/24/16 0519  NA 135 136 135  K 3.8 3.7 4.2  CL 100* 99* 101  CO2 24  --  23  GLUCOSE 110* 106* 103*  BUN 29* 30* 15  CREATININE 1.08 1.00 0.91  CALCIUM 8.7*  --  8.2*   PT/INR  Recent Labs  04/23/16 1337  LABPROT 14.3  INR 1.10   ABG No results for input(s): PHART, HCO3 in the last 72 hours.  Invalid input(s): PCO2, PO2  Studies/Results:  Anti-infectives: Anti-infectives    Start     Dose/Rate Route Frequency Ordered Stop   04/23/16 1400  piperacillin-tazobactam (ZOSYN) IVPB 3.375 g    Comments:  Zosyn 3.375 g IV q8h for CrCl > 20 mL/min   3.375 g 12.5 mL/hr over 240 Minutes Intravenous Every 8 hours 04/23/16 0605     04/23/16 0530  piperacillin-tazobactam (ZOSYN) IVPB 3.375 g     3.375 g 12.5 mL/hr over 240 Minutes Intravenous  Once 04/23/16 0520 04/23/16 1002   04/23/16 0400   piperacillin-tazobactam (ZOSYN) IVPB 3.375 g  Status:  Discontinued     3.375 g 100 mL/hr over 30 Minutes Intravenous  Once 04/23/16 0354 04/23/16 0355      Medications: Scheduled Meds: . dexamethasone  2 mg Oral Daily  . famotidine (PEPCID) IV  20 mg Intravenous Q12H  . lamoTRIgine  100 mg Oral BID  . levETIRAcetam  750 mg Oral BID  . pantoprazole  40 mg Oral Q0600  . piperacillin-tazobactam (ZOSYN)  IV  3.375 g Intravenous Q8H  . tamsulosin  0.4 mg Oral QPC supper   Continuous Infusions: . sodium chloride 100 mL/hr at 04/24/16 0800  . heparin 1,250 Units/hr (04/24/16 0800)   PRN Meds:.albuterol, barrier cream  Assessment/Plan: Patient Active Problem List   Diagnosis Date Noted  . Lactic acidosis 04/23/2016  . Leukocytosis 04/23/2016  . Nausea & vomiting 04/23/2016  . Nausea 04/23/2016  . Skin irritation 04/23/2016  . History of recent steroid use 04/23/2016  . Pulmonary embolus ([place]) 04/23/2016  . Cough 04/22/2016  . Acute delirium 04/08/2016  . Perforated bowel ([place]) 04/04/2016  . Humerus fracture 12/31/2015  . Closed right humeral fracture 12/31/2015  . Aortic insufficiency 08/11/2015  . 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  Obesity (BMI 30-39.9) 02/13/2015  . OSA (obstructive sleep apnea) 10/28/2014  . SOB (shortness of breath) 07/31/2014  . Fatigue due to excessive exertion 07/31/2014  . Edema extremities 07/31/2014  . Hypersomnia 07/31/2014  . Meningioma, recurrent of brain ([place]) 09/27/2013  . Unspecified constipation 09/26/2013  . Anemia, iron deficiency 09/26/2013  . Dizziness 09/20/2013  . Fall at home 09/20/2013  . Dehydration 09/20/2013  . Orthostasis 09/20/2013  . CKD (chronic kidney disease), stage III 09/20/2013  . Brain mass 09/20/2013  . Gait instability  08/20/2013  . Hypokalemia 10/09/2012  . Ileus ([place]) 10/08/2012  . Acute kidney injury ([place]) 10/08/2012  . Hypertension 10/08/2012  . Hyperlipidemia 10/08/2012  . GERD (gastroesophageal reflux disease) 10/08/2012  . Restless leg syndrome 10/08/2012   s/p  HH repair with placement of G tube, now admitted for PE on heparin drip -continue anticoagulation per medicine  Leaking G tube -barrier wafer seems to be working well -may require zinc oxide for improved protection if adhesive begins to be a problem -may be able to remove G tube in the next week if tolerating diet  Paraesophageal fluid collection -no leukocytosis, no pain with swallowing both go against a developing abscess. Abscess formation would be uncommon with this surgery -recommend empiric abx for 1 week and observation -can advance to full liquids and assess tolerance  Open surgical wound (wound vac off at initial assessment, reinitiated while at bedside) -no plug seen for wound vac battery low, recommending getting supplies from central, discussed with nurse   LOS: 1 day   Mickeal Skinner, MD Pg# [phone] [facility]

## 2016-04-24 NOTE — Progress Notes (Signed)
[place] for heparin Indication: pulmonary embolus  Allergies  Allergen Reactions  . Asa [Aspirin] Nausea Only  . Other Itching and Rash    Nuts  . Vicodin [Hydrocodone-Acetaminophen] Nausea Only    Patient Measurements: Height: 6\' 6"  (198.1 cm) Weight: 197 lb (89.4 kg) IBW/kg (Calculated) : 91.4 Heparin Dosing Weight: 85 kg  Vital Signs: Temp: 98 F (36.7 C) (08/06 0749) Temp Source: Oral (08/06 0749) BP: 132/74 (08/06 0339) Pulse Rate: 69 (08/06 0749)  Labs:  Recent Labs  04/23/16 0421 04/23/16 0445 04/23/16 0723 04/23/16 1337 04/23/16 2353 04/24/16 0519  HGB 13.4 14.6 13.5  --   --  11.1*  HCT 43.6 43.0 43.2  --   --  36.7*  PLT 277  --  239  --   --  283  LABPROT  --   --   --  14.3  --   --   INR  --   --   --  1.10  --   --   HEPARINUNFRC  --   --   --   --  0.90* 0.70  CREATININE 1.08 1.00  --   --   --  0.91    Estimated Creatinine Clearance: 77.8 mL/min (by C-G formula based on SCr of 0.91 mg/dL).  Assessment: 82 y.o. male with R lower lobe PE identified on CT, pharmacy consulted for heparin dosing.  HL therapeutic after dose reduction early this AM at [place]. Hgb downtrending 13.5>11.1 today (had G tube replaced 8/5), Pltc stable WNL, no s/sx bleeding noted.  Goal of Therapy:  Heparin level 0.3-0.7 units/ml Monitor platelets by anticoagulation protocol: Yes   Plan:  Continue Heparin 1250 units/hr Heparin level, CBC daily Watch Hgb trend, s/sx bleeding  Carlean Jews, Pharm.D. PGY1 Pharmacy Resident 8/6/20179:18 AM Pager [phone]

## 2016-04-24 NOTE — Progress Notes (Signed)
[facility]                                                                              Patient Demographics  [name], is a 82 y.o. male, DOB - [birth year], [id number]  Admit date - 04/23/2016   Admitting Physician Courage Denton Brick, MD  Outpatient Primary MD for the patient is  Melinda Crutch, MD  Outpatient specialists:   LOS - 1  days    Chief Complaint  Patient presents with  . Hypotension  . Emesis       Brief summary   [name] is a 82 y.o. male with medical history significant for seizures, prostate, hypertension, hyperlipidemia, GERD, aortic insufficiency, prostate cancer status post radiation therapy 2010, perforated gastric ulcer status post surgical repair 1 week ago presents to the emergency Department chief complaint of emesis generalized weakness hypotension. Initial evaluation reveals elevated lactic acid leukocytosis CT of the abdomen concerning for early abscess and/or pulmonary emboli. He states he was doing well at [facility] place after his surgery to weeks . He does endorse some generalized weakness and dizziness and has had difficulty bearing weight but is undergoing physical therapy for that. He also reports persistent productive cough since surgery. Last evening he reports coughing up blood-tinged sputum. Thereafter he experienced brown emesis times one episode. He denies abdominal pain headache syncope or near-syncope. He denies any diarrhea constipation. He does have a G-tube which he says has been leaking since surgery. He denies chest pain palpitations shortness of breath lower extremity edema. He denies diarrhea constipation dysuria hematuria frequency or urgency. THis morning EMS was called and reportedly systolic blood pressure in the 70s. Upon presentation to the emergency department systolic blood pressure in the 90s.   Assessment & Plan   Nausea and vomiting, recent perforated gastric ulcer status post repair on 7/17, para esophageal  fluid collection - Currently stable, general surgery following, no nausea or vomiting, patient tolerating clears - Per surgery, empiric antibiotics for 1 week - Lactic acidosis improving   Leaking G-tube - Per general surgery, barrier wafer seems to be working well, may be able to remove G-tube next week if tolerating diet  Bilateral PE with right heart strain and DVT - Continue heparin drip for now - Case management consult regarding NOAC'S - Discussed various options of anticoagulation with the patient, he wishes to be on NOAC's. Will need clearance from general surgery  Hypertension.  - BP currently poorly low, continue IV  fluids and hold Avapro    History of seizures/epileptic syndrome. No recent seizures. Medications include Keppra and lamictal. Baseline is walking with a walker since surgery unable to ambulate -Continue home meds   Anemia. Chart review indicates history of iron deficiency anemia. Hemoglobin 13.5 on admission. Provided with IV iron in the past. No signs symptoms of active bleeding -Hemoglobin stable -Monitor  Steroid use. He was placed on steroid taper by his PCP last month after a fall. He got down to 0.5 daily. This was increased since secondary to recent hospitalization. currently in the middle of another taper -Decadron 2 mg by mouth until August 11  Meningioma. Recurrent.  Wife states he's had 2 surgery and radiation in the past.  Code Status: FC DVT Prophylaxis:  heparin gtt Family Communication: Discussed in detail with the patient, all imaging results, lab results explained to the patient  Disposition Plan:   Time Spent in minutes 25 minutes  Procedures:  CT angiogram chest CT abdomen  Consultants:   General surgery  Antimicrobials:   IV Zosyn 8/5   Medications  Scheduled Meds: . benzonatate  100 mg Oral TID  . dexamethasone  2 mg Oral Daily  . famotidine  20 mg Oral Q12H  . lamoTRIgine  100 mg Oral BID  . levETIRAcetam  750  mg Oral BID  . pantoprazole  40 mg Oral Q0600  . piperacillin-tazobactam (ZOSYN)  IV  3.375 g Intravenous Q8H  . tamsulosin  0.4 mg Oral QPC supper   Continuous Infusions: . sodium chloride 100 mL/hr at 04/24/16 0800  . heparin 1,250 Units/hr (04/24/16 0800)   PRN Meds:.albuterol, barrier cream, gi cocktail, guaiFENesin   Antibiotics   Anti-infectives    Start     Dose/Rate Route Frequency Ordered Stop   04/23/16 1400  piperacillin-tazobactam (ZOSYN) IVPB 3.375 g    Comments:  Zosyn 3.375 g IV q8h for CrCl > 20 mL/min   3.375 g 12.5 mL/hr over 240 Minutes Intravenous Every 8 hours 04/23/16 0605     04/23/16 0530  piperacillin-tazobactam (ZOSYN) IVPB 3.375 g     3.375 g 12.5 mL/hr over 240 Minutes Intravenous  Once 04/23/16 0520 04/23/16 1002   04/23/16 0400  piperacillin-tazobactam (ZOSYN) IVPB 3.375 g  Status:  Discontinued     3.375 g 100 mL/hr over 30 Minutes Intravenous  Once 04/23/16 0354 04/23/16 0355        Subjective:   Anurag Lawton was seen and examined today.  *Feels somewhat better today, no chest pain or shortness of breath, still has some coughing spells. Tolerating clear liquids. Patient denies dizziness, chest pain, shortness of breath, new weakness, numbess, tingling. No acute events overnight.    Objective:   Vitals:   04/24/16 0339 04/24/16 0406 04/24/16 0749 04/24/16 1201  BP: 132/74   (!) 107/58  Pulse:   69 75  Resp: 19  18 20   Temp: 98.1 F (36.7 C)  98 F (36.7 C) 98.3 F (36.8 C)  TempSrc: Oral  Oral Oral  SpO2: 96%   92%  Weight:  89.4 kg (197 lb)    Height:        Intake/Output Summary (Last 24 hours) at 04/24/16 1215 Last data filed at 04/24/16 0800  Gross per 24 hour  Intake           2265.1 ml  Output              350 ml  Net           1915.1 ml     Wt Readings from Last 3 Encounters:  04/24/16 89.4 kg (197 lb)  04/22/16 98 kg (216 lb)  04/20/16 98 kg (216 lb)     Exam  General: Alert and oriented x 3, NAD  HEENT:    Neck: Supple, no JVD, no masses  Cardiovascular: S1 S2 auscultated, no rubs, murmurs or gallops. Regular rate and rhythm.  Respiratory: Clear to auscultation bilaterally, no wheezing, rales or rhonchi  Gastrointestinal: Soft, nontender,G-tube in place, wafer on  Ext: no cyanosis clubbing or edema  Neuro: AAOx3, Cr N's II- XII. Strength 5/5 upper and lower extremities bilaterally  Skin: No rashes  Psych: Normal affect and demeanor, alert and oriented x3    Data Reviewed:  I have personally reviewed following labs and imaging studies  Micro Results Recent Results (from the past 240 hour(s))  MRSA PCR Screening     Status: None   Collection Time: 04/23/16  4:33 PM  Result Value Ref Range Status   MRSA by PCR NEGATIVE NEGATIVE Final    Comment:        The GeneXpert MRSA Assay (FDA approved for NASAL specimens only), is one component of a comprehensive MRSA colonization surveillance program. It is not intended to diagnose MRSA infection nor to guide or monitor treatment for MRSA infections.     Radiology Reports Ct Angio Chest Pe W Or Wo Contrast  Result Date: 04/23/2016 CLINICAL DATA:  Pulmonary emboli identified in the right lower lobe on CT abdomen performed earlier today. Patient is status post recent laparotomy with gastric perforation repair and placement of gastrostomy tube. EXAM: CT ANGIOGRAPHY CHEST WITH CONTRAST TECHNIQUE: Multidetector CT imaging of the chest was performed using the standard protocol during bolus administration of intravenous contrast. Multiplanar CT image reconstructions and MIPs were obtained to evaluate the vascular anatomy. CONTRAST:  80 cc Isovue 370 COMPARISON:  CT abdomen from earlier today. FINDINGS: Mediastinum/Lymph Nodes: Moderate to large amount of pulmonary embolus is seen at the distal aspects of each main pulmonary artery extending into the central segmental pulmonary artery branches to each lobe bilaterally. Nearly occlusive thrombus  is seen within the bilateral intralobar pulmonary artery branches extending into multiple segmental pulmonary artery branches the lower lobes bilaterally. There is occlusive pulmonary embolus within the proximal aspects of the lingular pulmonary artery branch. Thoracic aorta is normal in caliber. Scattered atherosclerotic changes noted along the walls of the thoracic aorta. Heart size is normal. No pericardial effusion. Coronary artery calcifications noted. No masses or enlarged lymph nodes seen within the mediastinum or perihilar regions. As noted on the earlier CT abdomen, there is a circumscribed-appearing collection within the lower mediastinum, adjacent to the anterior margin of the lower esophagus, measuring 2.8 x 1.7 cm, suspicious for early developing abscess. Lungs/Pleura: Small left pleural effusion with adjacent atelectasis. Trace right pleural effusion with adjacent atelectasis. Small ill-defined ground-glass opacities within the upper lobes bilaterally, likely mild edema. Pulmonary nodule within the medial aspects of the right upper lobe measures 1.3 x 1.1 cm (series 6, image 36). Additional nodule within the lingula measures 6 mm (series 6, image 41). Upper abdomen: Small layering stones within the otherwise normal-appearing gallbladder. Small low-density collection adjacent to the lateral margin of the stomach cardia, as described on the earlier abdomen CT. Musculoskeletal: No acute or suspicious osseous finding. Mild degenerative spurring within the thoracic spine. Mild compression deformity of an upper thoracic vertebral body appears stable. Superficial soft tissues are unremarkable. Review of the MIP images confirms the above findings. IMPRESSION: 1. Moderate to large amount of pulmonary emboli bilaterally. This includes occlusive pulmonary embolus within the lingular pulmonary artery branch and nearly occlusive pulmonary emboli within the distal aspects of each main pulmonary artery extending into  multiple segmental lower lobe pulmonary artery branches bilaterally. There is CT evidence of right heart strain (with RV/LV Ratio greater than 1) consistent with at least submassive (intermediate risk) PE. The presence of right heart strain has been associated with an increased risk of morbidity and mortality. Please activate Code PE by paging [phone]. 2. Small left pleural effusion and trace right pleural effusion. 3. Pulmonary nodule within the right upper lobe  measuring 1.3 x 1.1 cm. This is new compared to an older chest CT of 04/06/2009. Neoplastic pulmonary nodule is not excluded. Consider further characterization with PET-CT after current issues are resolved. 4. Circumscribed fluid density collection within the lower mediastinum, adjacent to the anterior margin of the distal esophagus, measuring 2.8 x 1.7 cm, better demonstrated on the CT abdomen performed earlier today. On the earlier CT abdomen, this had an appearance suspicious for early developing abscess. Additional possible abscess versus expected postoperative fluid collection was also appreciated adjacent to the stomach cardia. 5. Aortic atherosclerosis. These results were called by telephone at the time of interpretation on 04/23/2016 at 3:24 p.m. to Dr. Denton Brick, who verbally acknowledged these results. Electronically Signed   By: Franki Cabot M.D.   On: 04/23/2016 15:31   Ct Abdomen Pelvis W Contrast  Result Date: 04/23/2016 CLINICAL DATA:  Discharge from hospital on July 28th after abdominal surgery. Now with sharp pains at surgical site. Hematemesis last night. Status post recent laparotomy with repair of gastric perforation and placement of gastrostomy tube. EXAM: CT ABDOMEN AND PELVIS WITH CONTRAST TECHNIQUE: Multidetector CT imaging of the abdomen and pelvis was performed using the standard protocol following bolus administration of intravenous contrast. CONTRAST:  118mL ISOVUE-300 IOPAMIDOL (ISOVUE-300) INJECTION 61% COMPARISON:  CT  abdomen status 04/04/2016 09/11/2015. FINDINGS: Lower chest: Small left pleural effusion and mild bibasilar atelectasis. Multiple acute appearing pulmonary emboli are seen within the segmental pulmonary artery branches to the right lower lobe. Hepatobiliary: Small layering stones within the otherwise normal-appearing gallbladder. Liver appears normal. Pancreas: Partially infiltrated with fat but otherwise unremarkable. Spleen: Within normal limits in size and appearance. Adrenals/Urinary Tract: Adrenal glands appear normal. Kidneys appear normal without mass, stone or hydronephrosis. No ureteral or bladder calculi identified. Bladder appears normal. Stomach/Bowel: Small hiatal hernia, significantly improved in appearance compared to the recent CT of 04/04/2016. There is a circumscribed fluid collection within the lower mediastinum, just to the left of the anterior margin of the lower esophagus, measuring 2.7 x 1.6 cm, suspicious for early developing abscess. Additional smaller less well circumscribed low-density collection is seen adjacent to the stomach cardia, measuring approximately 2.6 x 1.5 cm, possible additional early abscess formation. No dilated large or small bowel loops. Gastrostomy tube appears well positioned. Scattered diverticulosis within the descending and sigmoid colon without evidence of acute diverticulitis. Vascular/Lymphatic: Scattered atherosclerotic changes of the normal caliber abdominal aorta. No enlarged lymph nodes seen. Reproductive: No mass or other significant abnormality. Other: No free intraperitoneal air. Musculoskeletal: Degenerative changes throughout the slightly scoliotic thoracolumbar spine. No acute or suspicious osseous finding. IMPRESSION: 1. Interval surgical changes of the stomach, compatible with the given history of recent repair of gastric perforation and placement of gastrostomy tube. Circumscribed fluid density collection within the lower mediastinum, chest to the  left of the anterior margin of the lower esophagus, measuring 2.7 x 1.6 cm, possibly expected postoperative fluid collection but suspicious for early developing abscess collection. Associated perforation cannot be excluded, but no extraluminal air to confirm. 2. Poorly defined low-density collection adjacent to the stomach cardia, measuring approximately 2.6 x 1.5 cm, expected postsurgical fluid versus additional early developing abscess. 3. Acute appearing pulmonary emboli within segmental pulmonary artery branches to the right lower lobe. Would consider CT pulmonary angiogram for more complete characterization of the pulmonary arteries. 4. Gastrostomy tube appears well positioned. 5. No bowel obstruction.  No free intraperitoneal air. 6. Colonic diverticulosis without evidence of acute diverticulitis. 7. Cholelithiasis without evidence of acute cholecystitis.  8. Aortic atherosclerosis. 9. Small left pleural effusion. Critical Value/emergent results were called by telephone at the time of interpretation on 04/23/2016 at 12:22 pm to Dr. Johnney Killian, who verbally acknowledged these results. Electronically Signed   By: Franki Cabot M.D.   On: 04/23/2016 12:24   Ct Abdomen Pelvis W Contrast  Result Date: 04/04/2016 CLINICAL DATA:  Abdominal and back pain EXAM: CT ABDOMEN AND PELVIS WITH CONTRAST TECHNIQUE: Multidetector CT imaging of the abdomen and pelvis was performed using the standard protocol following bolus administration of intravenous contrast. CONTRAST:  147mL ISOVUE-300 IOPAMIDOL (ISOVUE-300) INJECTION 61% COMPARISON:  09/11/2015 FINDINGS: Lower chest and abdominal wall: There is a chronic large hiatal hernia with intrathoracic stomach. The GE junction is above the hiatus with chronic organoaxial volvulus. There is extraluminal gas, fat inflammation, and fluid around the stomach (which is nearly entirely visualized on the delayed phase). The stomach is not overdistended to suggest obstruction. No necrotic  segment of bowel or pneumatosis is seen. Gastric vessels normally opacify within the hernia sac. Umbilical hernia containing nonobstructed bowel. Hepatobiliary: No focal liver abnormality.Cholelithiasis. No evidence of biliary obstruction or inflammation. Pancreas: Unremarkable. Spleen: Unremarkable. Adrenals/Urinary Tract: Negative adrenals. No hydronephrosis or stone. Unremarkable bladder. Stomach/Bowel: Bowel findings described above. Colonic diverticulosis. Reproductive:Fiducial markers in the central right aspect of the prostate gland which has a stable CT appearance. No pelvic adenopathy. Vascular/Lymphatic: Extensive atherosclerotic calcification. No acute vascular abnormality. No mass or adenopathy. Musculoskeletal: Advanced lumbar facet arthropathy with L4-5 and L5-S1 anterolisthesis. Multilevel disc degeneration and spondylosis. Critical Value/emergent results were called by telephone at the time of interpretation on 04/04/2016 at 10:03 pm to Dr. Virgel Manifold , who verbally acknowledged these results. IMPRESSION: 1. Bowel perforation, presumably gastric, with extraluminal gas, fluid, and inflammation in a hiatal hernia sac. There is chronic intrathoracic stomach herniation with organoaxial volvulus. Despite the volvulus, the stomach is not obstructed or distended. 2. Bowel containing umbilical hernia without related obstruction. 3. Cholelithiasis. Electronically Signed   By: Monte Fantasia M.D.   On: 04/04/2016 22:06   Dg Chest Port 1 View  Result Date: 04/23/2016 CLINICAL DATA:  82 year old male with cough and hypotension EXAM: PORTABLE CHEST 1 VIEW COMPARISON:  Chest radiograph dated 04/08/2016 FINDINGS: Single portable view of chest demonstrates emphysematous changes of the lungs. There is mild eventration of the left hemidiaphragm. No focal consolidation, pleural effusion, or pneumothorax. Stable cardiac silhouette. Osteopenia with degenerative changes of the shoulders. No acute osseous pathology.  IMPRESSION: No active disease. Electronically Signed   By: Anner Crete M.D.   On: 04/23/2016 04:56   Dg Chest Port 1 View  Result Date: 04/08/2016 CLINICAL DATA:  Increased wheezing and shortness of breath EXAM: PORTABLE CHEST 1 VIEW COMPARISON:  04/06/2016 FINDINGS: Chronic cardiopericardial enlargement. Stable positioning of central line with tip over the SVC. Improved lung volumes despite interval extubation. Persistent bibasilar opacity that is likely atelectasis. The left diaphragm is now visible. No effusion or pneumothorax. IMPRESSION: Improved lung volumes and decreased atelectasis after extubation. Electronically Signed   By: Monte Fantasia M.D.   On: 04/08/2016 05:52   Dg Chest Port 1 View  Result Date: 04/06/2016 CLINICAL DATA:  Intubation. EXAM: PORTABLE CHEST 1 VIEW COMPARISON:  04/05/2016. FINDINGS: Endotracheal tube 1.8 cm above the carina. Proximal repositioning of approximately 1 -2 cm should be considered. Right IJ line stable position. Heart size stable. Low lung volumes with persist bibasilar atelectasis and/or infiltrates. Small bilateral pleural effusions cannot be excluded. No pneumothorax. IMPRESSION: 1. Endotracheal tube 1.8  cm above the carina. Proximal repositioning of approximately 1-2 cm should be considered . Right IJ line in stable position. 2. Low lung volumes with persistent bibasilar atelectasis and/or infiltrates. Small bilateral pleural effusions cannot be excluded . Electronically Signed   By: Marcello Moores  Register   On: 04/06/2016 06:46   Dg Chest Port 1 View  Result Date: 04/05/2016 CLINICAL DATA:  Postop.  Endotracheal tube. EXAM: PORTABLE CHEST 1 VIEW COMPARISON:  12/31/2015 FINDINGS: Low volume chest with streaky opacity greater on the left. Interstitial coarsening. Stable heart size. Central line on the right with tip at the SVC level. No pneumothorax. IMPRESSION: 1. Endotracheal tube and right IJ line in good position. No pneumothorax. 2. Postoperative  atelectasis and pulmonary venous congestion. Electronically Signed   By: Monte Fantasia M.D.   On: 04/05/2016 03:26   Dg Swallowing Func-speech Pathology  Result Date: 04/13/2016 Objective Swallowing Evaluation: Type of Study: MBS-Modified Barium Swallow Study Patient Details Name: KAIDON CASTELLANO MRN: [id number] Date of Birth: 04-01-1931 Today's Date: 04/13/2016 Time: SLP Start Time (ACUTE ONLY): 1430-SLP Stop Time (ACUTE ONLY): 1500 SLP Time Calculation (min) (ACUTE ONLY): 30 min Past Medical History: Past Medical History: Diagnosis Date . Anemia  . Aortic insufficiency 08/11/2015 . Basal cell cancer  . Brain tumor ([place])  . GERD (gastroesophageal reflux disease)  . Herniated disc   lumbar spine . Hiatal hernia  . History of radiation therapy   back approximately 2010 prostate radiation under care of Dr. Valere Dross  . Hyperlipidemia  . Hypertension  . Meningioma ([place])   brain . Nocturia  . Occult blood in stools  . OSA (obstructive sleep apnea) 10/28/2014  Moderate with AHI 21/hr . Prostate cancer ([place])  . Restless leg  . Seizures ([place])  Past Surgical History: Past Surgical History: Procedure Laterality Date . APPLICATION OF CRANIAL NAVIGATION N/A [id number]  Procedure: APPLICATION OF CRANIAL NAVIGATION;  Surgeon: Consuella Lose, MD;  Location: [place] [facility];  Service: Neurosurgery;  Laterality: N/A;  APPLICATION OF CRANIAL NAVIGATION . Bilateral cateract surgery   . COLONOSCOPY    colon polyp removed . CRANIOTOMY Right 09/27/2013  Procedure: RIGHT FRONTAL CRANIOTOMY FOR TUMOR RESECTION ;  Surgeon: Consuella Lose, MD;  Location: [place] [facility] OR[facility];  Service: Neurosurgery;  Laterality: Right; . CRANIOTOMY Right 02/17/2015  Procedure: Right frontal parietal craniotomy for resection of meningioma with brainlab;  Surgeon: Consuella Lose, MD;  Location: [place] [facility]UR[facility] OR[facility];  Service: Neurosurgery;  Laterality: Right;  Right frontal parietal craniotomy for resection of meningioma with brainlab . JOINT REPLACEMENT    right knee  replacement . LAPAROTOMY N/A 04/04/2016  Procedure: EXPLORATORY LAPAROTOMY, REPAIR OF GASTRIC PERFORATION WITH OMENTAL PATCH, PLACEMENT OF GASTROSTOMY TUBE;  Surgeon: Autumn Messing III, MD;  Location: [facility]S;  Service: General;  Laterality: N/A; . PROSTATE BIOPSY   . TONSILLECTOMY    Agae 6-7 . VASECTOMY   HPI: 82 yo male adm to [facility] with abdominal pain - pt is s/p surgery with PEG placed 7/17 with intubation a few days.  Per wife, pt has been in ICU and has not had po until yesterday.   Pt started po intake yesteday and was coughing with liquids, therefore diet was changed to nectar liquids and SLP ordered.   Diet was advanced to regular/thin today.  Pt PMH + for meningioma s/p XRT and surgery, anemia, seizures.  Pt CXR shows atelectasis.   Subjective: pt seen in radiology for MBS Assessment / Plan / Recommendation CHL IP CLINICAL IMPRESSIONS 04/13/2016 Therapy Diagnosis Mild oral  phase dysphagia;Mild pharyngeal phase dysphagia Clinical Impression Pt presents with mild oropharyngeal dysphagia, characterized by poor bolus formation of puree and solid with posterior spillage to the vallecula, and right anterior leakage of liquids. Swallow reflex was delayed, with trigger at the vallecula on the initial swallow. Poor airway closure resulted in flash penetration of thin liquids during the initial swallow. Nectar thick liquid prevented penetration of the initial swallow, however, Residue of thin and nectar thick liquid on tongue base and vallecula was noted to spill to the pyriform prior to trigger of (cued) dry swallow.  Small boluses and immediate second swallow prevented penetration of nectar thick liquids. Recommend dys 3 solids with chopped meats and nectar thick liquids, SMALL bites and sips at a SLOW rate, with 2 swallows per bite/sip. Meds one at a time with puree. RN was notified of results and recommendations, and safe swallow precautions were posted at [facility] ST will follow for education and assessment of diet  tolerance. Recommend repeat MBS prior to advancing liquids, given lack of reflexive response to penetration on this study. Impact on safety and function Risk for inadequate nutrition/hydration;Moderate aspiration risk;Mild aspiration risk   CHL IP TREATMENT RECOMMENDATION 04/13/2016 Treatment Recommendations Therapy as outlined in treatment plan below   Prognosis 04/13/2016 Prognosis for Safe Diet Advancement Guarded Barriers to Reach Goals Motivation CHL IP DIET RECOMMENDATION 04/13/2016 SLP Diet Recommendations Dysphagia 3 (Mech soft) solids;Nectar thick liquid Liquid Administration via No straw;Cup Medication Administration Whole meds with puree Compensations Slow rate;Small sips/bites;Clear throat intermittently;Other (Comment);Minimize environmental distractions;Multiple dry swallows after each bite/sip Postural Changes Seated upright at 90 degrees   CHL IP OTHER RECOMMENDATIONS 04/13/2016 Recommended Consults -- Oral Care Recommendations Oral care BID Other Recommendations Order thickener from pharmacy;Prohibited food (jello, ice cream, thin soups);Remove water pitcher   CHL IP FOLLOW UP RECOMMENDATIONS 04/12/2016 Follow up Recommendations To be determined   CHL IP FREQUENCY AND DURATION 04/13/2016 Speech Therapy Frequency (ACUTE ONLY) min 2x/week Treatment Duration 1 week    CHL IP ORAL PHASE 04/13/2016 Oral Phase Impaired Oral - Nectar Cup Right anterior bolus loss Oral - Thin Cup Right anterior bolus loss Oral - Puree Reduced posterior propulsion;Delayed oral transit;Premature spillage Oral - Multi-Consistency Premature spillage;Delayed oral transit;Reduced posterior propulsion  CHL IP PHARYNGEAL PHASE 04/13/2016 Pharyngeal Phase Impaired Pharyngeal- Nectar Cup Delayed swallow initiation-vallecula;Reduced pharyngeal peristalsis;Reduced anterior laryngeal mobility;Reduced laryngeal elevation;Reduced airway/laryngeal closure;Reduced tongue base retraction;Penetration/Aspiration during swallow;Pharyngeal residue -  valleculae;Delayed swallow initiation-pyriform sinuses Pharyngeal Material enters airway, remains ABOVE vocal cords then ejected out Pharyngeal- Thin Cup Delayed swallow initiation-vallecula;Delayed swallow initiation-pyriform sinuses;Reduced pharyngeal peristalsis;Reduced anterior laryngeal mobility;Reduced laryngeal elevation;Reduced airway/laryngeal closure;Pharyngeal residue - valleculae;Reduced tongue base retraction;Penetration/Aspiration during swallow Pharyngeal Material enters airway, remains ABOVE vocal cords then ejected out Pharyngeal- Puree Delayed swallow initiation-vallecula;Reduced tongue base retraction Pharyngeal- Multi-consistency Delayed swallow initiation-vallecula;Reduced tongue base retraction  CHL IP CERVICAL ESOPHAGEAL PHASE 04/13/2016 Cervical Esophageal Phase [facility] [place], [facility], CCC-SLP [id number] Shonna Chock 04/13/2016, 3:19 PM               Lab Data:  CBC:  Recent Labs Lab 04/23/16 0421 04/23/16 0445 04/23/16 0723 04/24/16 0519  WBC 14.2*  --  13.5* 10.8*  NEUTROABS 11.6*  --   --   --   HGB 13.4 14.6 13.5 11.1*  HCT 43.6 43.0 43.2 36.7*  MCV 91.6  --  90.6 92.0  PLT 277  --  239 [id number]   Basic Metabolic Panel:  Recent Labs Lab 04/23/16 0421 04/23/16 0445 04/24/16 0519  NA 135 136 135  K 3.8 3.7 4.2  CL 100* 99* 101  CO2 24  --  23  GLUCOSE 110* 106* 103*  BUN 29* 30* 15  CREATININE 1.08 1.00 0.91  CALCIUM 8.7*  --  8.2*   GFR: Estimated Creatinine Clearance: 77.8 mL/min (by C-G formula based on SCr of 0.91 mg/dL). Liver Function Tests:  Recent Labs Lab 04/23/16 0421  AST 31  ALT 30  ALKPHOS 72  BILITOT 0.5  PROT 5.8*  ALBUMIN 2.4*   No results for input(s): LIPASE, AMYLASE in the last 168 hours. No results for input(s): AMMONIA in the last 168 hours. Coagulation Profile:  Recent Labs Lab 04/23/16 1337  INR 1.10   Cardiac Enzymes: No results for input(s): CKTOTAL, CKMB, CKMBINDEX, TROPONINI in the last 168 hours. BNP  (last 3 results) No results for input(s): PROBNP in the last 8760 hours. HbA1C: No results for input(s): HGBA1C in the last 72 hours. CBG: No results for input(s): GLUCAP in the last 168 hours. Lipid Profile: No results for input(s): CHOL, HDL, LDLCALC, TRIG, CHOLHDL, LDLDIRECT in the last 72 hours. Thyroid Function Tests: No results for input(s): TSH, T4TOTAL, FREET4, T3FREE, THYROIDAB in the last 72 hours. Anemia Panel: No results for input(s): VITAMINB12, FOLATE, FERRITIN, TIBC, IRON, RETICCTPCT in the last 72 hours. Urine analysis:    Component Value Date/Time   COLORURINE YELLOW 04/23/2016 1221   APPEARANCEUR TURBID (A) 04/23/2016 1221   LABSPEC 1.045 (H) 04/23/2016 1221   PHURINE 7.0 04/23/2016 1221   GLUCOSEU NEGATIVE 04/23/2016 1221   HGBUR NEGATIVE 04/23/2016 1221   BILIRUBINUR NEGATIVE 04/23/2016 1221   KETONESUR NEGATIVE 04/23/2016 1221   PROTEINUR 30 (A) 04/23/2016 1221   UROBILINOGEN 0.2 07/31/2014 1941   NITRITE NEGATIVE 04/23/2016 1221   LEUKOCYTESUR NEGATIVE 04/23/2016 1221     RAI,RIPUDEEP M.D. [facility] 04/24/2016, 12:15 PM  Pager: [phone] Between 7am to 7pm - call Pager - [phone]  After 7pm go to [email or web address] - password [email or web address]  Call night coverage person covering after 7pm

## 2016-04-24 NOTE — Progress Notes (Signed)
  Echocardiogram 2D Echocardiogram has been performed.  Jennette Dubin 04/24/2016, 12:12 PM

## 2016-04-24 NOTE — Progress Notes (Signed)
VASCULAR LAB PRELIMINARY  PRELIMINARY  PRELIMINARY  PRELIMINARY  Bilateral lower extremity venous duplex has been completed.      Left: DVT noted in the proximal femoral vein. Right:  No evidence of DVT, Bilateral-  No evidence of superficial thrombosis.  No Baker's cyst.  Called patient's nurse Denice Paradise, RN@10 :33 am.  Janifer Adie, RVT, RDMS 04/24/2016, 10:54 AM

## 2016-04-24 NOTE — Evaluation (Signed)
Physical Therapy Evaluation Patient Details Name: [name] MRN: [id number] DOB: 1931/02/07 Today's Date: 04/24/2016   History of Present Illness   82 yo M with multiple medical issues, 1 week s/p perforated gastric ulcer repair admit from SNF via ED with emesis, generalized weakness, hypotension.  Found to have bil PE with right heart strain, left femoral DVT,  leaking G tube.  Wound vac to abdomen.   Clinical Impression  Pt presents with severe to profound functional limitations due to weakness, limited ROM, cardiopulmonary dysfunction, complicated by prolonged inactivity and currently requires at least 2 person assistance for basic mobility activities.  Admitted from SNF following previous acute admission, recommend he return and resume therapy plan of care.  Recommend nursing assist with dangle EOB and bed to chair as able (use lift equipment for safest technique).  PT will initiate rehab in acute setting in prep for return to SNF.  See care plan for goals of care.    Follow Up Recommendations SNF    Equipment Recommendations  None recommended by PT    Recommendations for Other Services       Precautions / Restrictions Precautions Precautions: Fall Precaution Comments: multiple lines and leg weakness. Restrictions Weight Bearing Restrictions: No      Mobility  Bed Mobility Overal bed mobility: Needs Assistance Bed Mobility: Rolling;Sidelying to Sit;Sit to Sidelying;Supine to Sit Rolling: Mod assist Sidelying to sit: Mod assist Supine to sit: Max assist   Sit to sidelying: Max assist General bed mobility comments: struggles with trunk rotation as expected to assist with rolling, needs cues to use rails to his advantage; incr time to transition legs to bed edge, needs assist to scoot hips (draw sheet and lateral flexion facilitation), cannot raise trunk into sitting without assist even with rails.    Transfers Overall transfer level: Needs assistance Equipment used: 1  person hand held assist;Rolling walker (2 wheeled) Transfers: Sit to/from Stand Sit to Stand: Max assist;From elevated surface         General transfer comment: unable to complete full sit to stand, fearful with fwd lean perception; used front guard approach, coordinated effort to repeat partial sit to stand; 2 sets of 3 reps with 1 min rest between... pt able to progress to partial stand but cannot fully extend or bear own weight and cannot control descent to surface without assistance; pt pleased with progress over 6 trials  Ambulation/Gait             General Gait Details: unable currently  Stairs            Wheelchair Mobility    Modified Rankin (Stroke Patients Only)       Balance Overall balance assessment: Needs assistance Sitting-balance support: Feet supported;Bilateral upper extremity supported Sitting balance-Leahy Scale: Poor   Postural control: Posterior lean Standing balance support: During functional activity;Bilateral upper extremity supported Standing balance-Leahy Scale: Zero                               Pertinent Vitals/Pain Pain Assessment: 0-10 Pain Score: 3  Pain Location: abdomen    Home Living Family/patient expects to be discharged to:: Skilled nursing facility                 Additional Comments: from SNF with PE following prior admission/discharge from [facility]    Prior Function Level of Independence: Needs assistance   Gait / Transfers Assistance Needed: unable currently; was not  progressing with PT at SNF due to orthostasis likely due to medical issues     Comments: was apparently ambulatory prior to a fall in the recent past     Hand Dominance   Dominant Hand: Right    Extremity/Trunk Assessment   Upper Extremity Assessment: Defer to OT evaluation           Lower Extremity Assessment: RLE deficits/detail;LLE deficits/detail RLE Deficits / Details: 3/5 with some lack of extension at knee LLE  Deficits / Details: 3-/5, lacks full knee extension and difficulty controling ankle circumduction     Communication   Communication: No difficulties  Cognition Arousal/Alertness: Awake/alert Behavior During Therapy: WFL for tasks assessed/performed Overall Cognitive Status: Within Functional Limits for tasks assessed                      General Comments General comments (skin integrity, edema, etc.): wound vac to abdomen along with several abd pads for leakage, nursing called to inspect, is aware and coordinating care with MD.    Exercises        Assessment/Plan    PT Assessment Patient needs continued PT services  PT Diagnosis Difficulty walking;Generalized weakness   PT Problem List Decreased strength;Decreased range of motion;Decreased activity tolerance;Decreased balance;Decreased mobility;Decreased knowledge of use of DME;Cardiopulmonary status limiting activity;Obesity;Decreased skin integrity  PT Treatment Interventions DME instruction;Gait training;Functional mobility training;Therapeutic activities;Therapeutic exercise;Patient/family education;Balance training   PT Goals (Current goals can be found in the Care Plan section) Acute Rehab PT Goals Patient Stated Goal: to get stronger PT Goal Formulation: With patient Time For Goal Achievement: 05/08/16 Potential to Achieve Goals: Good    Frequency Min 2X/week   Barriers to discharge Inaccessible home environment;Decreased caregiver support needs to be more independent    Co-evaluation               End of Session Equipment Utilized During Treatment: Gait belt Activity Tolerance: Patient limited by fatigue Patient left: in bed;with call bell/phone within reach;with nursing/sitter in room Nurse Communication: Mobility status         Time: [id number] PT Time Calculation (min) (ACUTE ONLY): 30 min   Charges:   PT Evaluation $PT Eval Moderate Complexity: 1 Procedure PT Treatments $Therapeutic  Activity: 8-22 mins   PT G Codes:        Herbie Drape 04/24/2016, 2:41 PM

## 2016-04-24 NOTE — Plan of Care (Signed)
Problem: Skin Integrity: Goal: Risk for impaired skin integrity will decrease Outcome: Progressing Orders to Change wound vac dressing MWF, hospital wound vac applied to pt. Vac attached to 120mmHg continous suction per order. Pt resting comfortably. Brother in room. Diet transitioned to soft. + DVT in proximal femoral vein.

## 2016-04-25 LAB — HEPARIN LEVEL (UNFRACTIONATED): Heparin Unfractionated: 0.64 IU/mL (ref 0.30–0.70)

## 2016-04-25 LAB — CBC
HCT: 35.2 % — ABNORMAL LOW (ref 39.0–52.0)
Hemoglobin: 10.7 g/dL — ABNORMAL LOW (ref 13.0–17.0)
MCH: 27.6 pg (ref 26.0–34.0)
MCHC: 30.4 g/dL (ref 30.0–36.0)
MCV: 91 fL (ref 78.0–100.0)
PLATELETS: 244 10*3/uL (ref 150–400)
RBC: 3.87 MIL/uL — ABNORMAL LOW (ref 4.22–5.81)
RDW: 18.7 % — AB (ref 11.5–15.5)
WBC: 9 10*3/uL (ref 4.0–10.5)

## 2016-04-25 LAB — BASIC METABOLIC PANEL
Anion gap: 7 (ref 5–15)
BUN: 11 mg/dL (ref 6–20)
CALCIUM: 8 mg/dL — AB (ref 8.9–10.3)
CO2: 24 mmol/L (ref 22–32)
CREATININE: 0.92 mg/dL (ref 0.61–1.24)
Chloride: 104 mmol/L (ref 101–111)
GFR calc non Af Amer: >60 mL/min (ref >60)
GLUCOSE: 86 mg/dL (ref 65–99)
Potassium: 3.4 mmol/L — ABNORMAL LOW (ref 3.5–5.1)
Sodium: 135 mmol/L (ref 135–145)

## 2016-04-25 MED ORDER — ROPINIROLE HCL 1 MG PO TABS
1.5000 mg | ORAL_TABLET | Freq: Two times a day (BID) | ORAL | Status: DC
  Administered 2016-04-25 – 2016-04-30 (×10): 1.5 mg via ORAL
  Filled 2016-04-25 (×10): qty 1

## 2016-04-25 MED ORDER — RESOURCE THICKENUP CLEAR PO POWD
ORAL | Status: DC | PRN
  Filled 2016-04-25: qty 125

## 2016-04-25 MED ORDER — BISACODYL 10 MG RE SUPP
10.0000 mg | Freq: Every day | RECTAL | Status: DC | PRN
  Filled 2016-04-25: qty 1

## 2016-04-25 MED ORDER — POLYETHYLENE GLYCOL 3350 17 G PO PACK
17.0000 g | PACK | Freq: Every day | ORAL | Status: DC
  Administered 2016-04-25 – 2016-04-30 (×6): 17 g via ORAL
  Filled 2016-04-25 (×6): qty 1

## 2016-04-25 MED ORDER — NYSTATIN 100000 UNIT/GM EX CREA
TOPICAL_CREAM | Freq: Two times a day (BID) | CUTANEOUS | Status: DC
  Administered 2016-04-25 (×2): via TOPICAL
  Administered 2016-04-26: 1 via TOPICAL
  Administered 2016-04-26 – 2016-04-27 (×3): via TOPICAL
  Administered 2016-04-28: 1 via TOPICAL
  Administered 2016-04-28 – 2016-04-30 (×4): via TOPICAL
  Filled 2016-04-25: qty 15

## 2016-04-25 MED ORDER — MENTHOL 3 MG MT LOZG
1.0000 | LOZENGE | OROMUCOSAL | Status: DC | PRN
  Administered 2016-04-25: 3 mg via ORAL
  Filled 2016-04-25 (×3): qty 9

## 2016-04-25 NOTE — NC FL2 (Signed)
[place] MEDICAID FL2 LEVEL OF CARE SCREENING TOOL     IDENTIFICATION  Patient Name: [name] Birthdate: 1931/09/02 Sex: male Admission Date (Current Location): 04/23/2016  [facility] and [place] Number:  Herbalist and Address:  [facility] [place], [place] [address] [place], [place] [address]      Provider Number: [id number]  Attending Physician Name and Address:  Mendel Corning, MD  Relative Name and Phone Number:       Current Level of Care: Hospital Recommended Level of Care: [place] Prior Approval Number:    Date Approved/Denied:   PASRR Number: [id number]  Discharge Plan: SNF    Current Diagnoses: Patient Active Problem List   Diagnosis Date Noted  . Lactic acidosis 04/23/2016  . Leukocytosis 04/23/2016  . Nausea & vomiting 04/23/2016  . Nausea 04/23/2016  . Skin irritation 04/23/2016  . History of recent steroid use 04/23/2016  . Pulmonary embolus ([place]) 04/23/2016  . Cough 04/22/2016  . Acute delirium 04/08/2016  . Perforated bowel ([place]) 04/04/2016  . Humerus fracture 12/31/2015  . Closed right humeral fracture 12/31/2015  . Aortic insufficiency 08/11/2015  . 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 Atypical meningioma of brain ([place]) 02/17/2015  . Obesity (BMI 30-39.9) 02/13/2015  . OSA (obstructive sleep apnea) 10/28/2014  . SOB (shortness of breath) 07/31/2014  . Fatigue due to excessive exertion 07/31/2014  . Edema extremities 07/31/2014  . Hypersomnia 07/31/2014  . Meningioma, recurrent of brain ([place]) 09/27/2013  . Unspecified constipation 09/26/2013  . Anemia, iron deficiency 09/26/2013  . Dizziness 09/20/2013  . Fall at home 09/20/2013  . Dehydration 09/20/2013  . Orthostasis 09/20/2013  . CKD (chronic kidney disease), stage III 09/20/2013  . Brain mass 09/20/2013   . Gait instability 08/20/2013  . Hypokalemia 10/09/2012  . Ileus ([place]) 10/08/2012  . Acute kidney injury ([place]) 10/08/2012  . Hypertension 10/08/2012  . Hyperlipidemia 10/08/2012  . GERD (gastroesophageal reflux disease) 10/08/2012  . Restless leg syndrome 10/08/2012    Orientation RESPIRATION BLADDER Height & Weight     Self, Time, Situation, Place  Normal Incontinent, External catheter Weight: 186 lb 9.6 oz (84.6 kg) Height:  6\' 6"  (198.1 cm)  BEHAVIORAL SYMPTOMS/MOOD NEUROLOGICAL BOWEL NUTRITION STATUS   (NONE )  (NONE ) Continent Diet (DYS 3)  AMBULATORY STATUS COMMUNICATION OF NEEDS Skin   Extensive Assist Verbally Surgical wounds                       Personal Care Assistance Level of Assistance  Bathing, Feeding, Dressing Bathing Assistance: Limited assistance Feeding assistance: Independent Dressing Assistance: Limited assistance     Functional Limitations Info  Sight, Hearing, Speech Sight Info: Adequate Hearing Info: Adequate Speech Info: Adequate    SPECIAL CARE FACTORS FREQUENCY  PT (By licensed PT)     PT Frequency: 2              Contractures      Additional Factors Info  Code Status, Allergies Code Status Info:  (FULL CODE ) Allergies Info:  ([place]a Aspirin, Other, Vicodin Hydrocodone-acetaminophen)           Current Medications (04/25/2016):  This is the current hospital active medication list Current Facility-Administered Medications  Medication Dose Route Frequency Provider Last Rate Last Dose  . 0.9 %  sodium chloride infusion   Intravenous Continuous Ripudeep Krystal Eaton, MD  100 mL/hr at 04/24/16 1700    . albuterol (PROVENTIL) (2.5 MG/3ML) 0.083% nebulizer solution 2.5 mg  2.5 mg Nebulization Q4H PRN Radene Gunning, NP      . barrier cream (non-specified) 1 application  1 application Topical BID PRN Ripudeep Krystal Eaton, MD      . benzonatate (TESSALON) capsule 100 mg  100 mg Oral TID Ripudeep Krystal Eaton, MD   100 mg at 04/25/16 0657  . bisacodyl  (DULCOLAX) suppository 10 mg  10 mg Rectal Daily PRN Ripudeep K Rai, MD      . dexamethasone (DECADRON) tablet 2 mg  2 mg Oral Daily Radene Gunning, NP   2 mg at 04/25/16 1000  . famotidine (PEPCID) tablet 20 mg  20 mg Oral Q12H Ripudeep K Rai, MD   20 mg at 04/25/16 1000  . gi cocktail (Maalox,Lidocaine,Donnatal)  30 mL Oral TID PRN Ripudeep Krystal Eaton, MD   30 mL at 04/24/16 0937  . guaiFENesin (ROBITUSSIN) 100 MG/5ML solution 100 mg  5 mL Oral Q4H PRN Ripudeep K Rai, MD   100 mg at 04/24/16 2022  . heparin ADULT infusion 100 units/mL (25000 units/255mL sodium chloride 0.45%)  1,250 Units/hr Intravenous Continuous Courage Emokpae, MD 12.5 mL/hr at 04/25/16 0533 1,250 Units/hr at 04/25/16 0533  . lamoTRIgine (LAMICTAL) tablet 100 mg  100 mg Oral BID Lezlie Octave Black, NP   100 mg at 04/25/16 1000  . levETIRAcetam (KEPPRA) tablet 750 mg  750 mg Oral BID Lezlie Octave Black, NP   750 mg at 04/25/16 1000  . menthol-cetylpyridinium (CEPACOL) lozenge 3 mg  1 lozenge Oral PRN Rhetta Mura Schorr, NP   3 mg at 04/25/16 [id number]  . nystatin cream (MYCOSTATIN)   Topical BID Ripudeep K Rai, MD      . pantoprazole (PROTONIX) EC tablet 40 mg  40 mg Oral Q0600 Ripudeep Krystal Eaton, MD   40 mg at 04/25/16 0529  . piperacillin-tazobactam (ZOSYN) IVPB 3.375 g  3.375 g Intravenous Q8H Ankit Nanavati, MD 12.5 mL/hr at 04/25/16 0526 3.375 g at 04/25/16 0526  . polyethylene glycol (MIRALAX / GLYCOLAX) packet 17 g  17 g Oral Daily Ripudeep K Rai, MD      . senna-docusate (Senokot-S) tablet 1 tablet  1 tablet Oral BID Ripudeep Krystal Eaton, MD   1 tablet at 04/25/16 1000  . tamsulosin (FLOMAX) capsule 0.4 mg  0.4 mg Oral QPC supper Radene Gunning, NP   0.4 mg at 04/24/16 1633     Discharge Medications: Please see discharge summary for a list of discharge medications.  Relevant Imaging Results:  Relevant Lab Results:   Additional Information  [facility] [id number] Patient has G tube )   Glendon Axe, MSW [phone] 04/25/2016 2:33 PM

## 2016-04-25 NOTE — Progress Notes (Signed)
Notified [facility] provider on call d/t concerns for a yeast infection in bilat groin area probably secondary to Decadron usage, antibiotics, and being warm/dark/and moist. Diflucan IV ordered and applied antifungal powder.

## 2016-04-25 NOTE — Care Management Note (Addendum)
Case Management Note  Patient Details  Name: ANDAN PAL MRN: [id number] Date of Birth: 1930-11-24  Subjective/Objective:  Pt presented from [facility] with N/V-recent perforated gastric ulcer status post repair on 7/17, para esophageal fluid collection.  General surgery following- patient tolerating clears. Plan will be for empiric antibiotics for 1 week per MD notes. Bilateral PE with right heart strain and DVT- Plan is to Continue heparin drip for now.                 Action/Plan: CSW assisting with disposition needs.  CM will look into NAOC'S. Benefits check in process. Will make pt aware once completed. No further needs identified at this time.   Expected Discharge Date:                  Expected Discharge Plan:  Skilled Nursing Facility  In-House Referral:  Clinical Social Work  Discharge planning Services  CM Consult  Post Acute Care Choice:  NA Choice offered to:  NA  DME Arranged:  N/A DME Agency:  NA  HH Arranged:  NA HH Agency:  NA  Status of Service:  Completed, signed off  If discussed at [place] of Stay Meetings, dates discussed:    Additional Comments: 1143 04-26-16 Jacqlyn Krauss, RN,BSN [phone] CM did provide pt with the 30 day free card. Pt to return to [facility]. No further needs at this time. Pt will need Rx written for starter pack in order for 30 day free card to cover the cost of the initial start of  [place] sent to Nestor Lewandowsky, RN        S/W [facility] @ [facility] # [phone]   XARELTO 15 MG BID FOR 21 DAYS   COVER- YES  CO-PAY- $ 94.00 42 TAB  TIER- 3 DRUG  PRIOR APPROVAL - N   XARELTO 20 MG DAILY ( 30 )  COVER- YES  CO-PAY- $ 47.00  30 TAB  TIER- 3 DRUG  PRIOR APPROVAL -NO   ELIQUIS 5 MG BID (30 )  COVER- YES  CO-PAY- $ 94.00  TIER- 3 DRUG  PRIOR APPROVAL - NO   PHARMACY : [facility] AND [facility]    Bethena Roys, RN 04/25/2016, 1:47 PM

## 2016-04-25 NOTE — Progress Notes (Signed)
[facility]                                                                              Patient Demographics  [name], is a 82 y.o. male, DOB - 06-15-1931, [id number]  Admit date - 04/23/2016   Admitting Physician Courage Denton Brick, MD  Outpatient Primary MD for the patient is  Melinda Crutch, MD  Outpatient specialists:   LOS - 2  days    Chief Complaint  Patient presents with  . Hypotension  . Emesis       Brief summary   [name] is a 82 y.o. male with medical history significant for seizures, prostate, hypertension, hyperlipidemia, GERD, aortic insufficiency, prostate cancer status post radiation therapy 2010, perforated gastric ulcer status post surgical repair 1 week ago presents to the emergency Department chief complaint of emesis generalized weakness hypotension. Initial evaluation reveals elevated lactic acid leukocytosis CT of the abdomen concerning for early abscess and/or pulmonary emboli. He states he was doing well at [facility] place after his surgery to weeks . He does endorse some generalized weakness and dizziness and has had difficulty bearing weight but is undergoing physical therapy for that. He also reports persistent productive cough since surgery. Last evening he reports coughing up blood-tinged sputum. Thereafter he experienced brown emesis times one episode. He denies abdominal pain headache syncope or near-syncope. He denies any diarrhea constipation. He does have a G-tube which he says has been leaking since surgery. He denies chest pain palpitations shortness of breath lower extremity edema. He denies diarrhea constipation dysuria hematuria frequency or urgency. THis morning EMS was called and reportedly systolic blood pressure in the 70s. Upon presentation to the emergency department systolic blood pressure in the 90s.   Assessment & Plan   Nausea and vomiting, recent perforated gastric ulcer status post repair on 7/17, para esophageal  fluid collection - Currently stable, general surgery following, no nausea or vomiting, patient tolerating clears - Per surgery, empiric antibiotics for 1 week - Lactic acidosis improving  Leaking G-tube - General surgery following, will follow recommendations  - Per general surgery, may be able to remove G-tube if tolerating diet. Will continue IV heparin drip until the final decision.  Bilateral PE with right heart strain and DVT - Continue heparin drip for now - Case management consult regarding NOAC'S - Discussed various options of anticoagulation with the patient, he wishes to be on NOAC's. Will need clearance from general surgery and if G-tube removal is planned, will switch after the procedure  Hypertension.  -BP currently stable, continue to hold Avapro   History of seizures/epileptic syndrome, meningioma: No recent seizures. Medications include Keppra and lamictal. Baseline is walking with a walker since surgery unable to ambulate -Continue home meds   Anemia. Chart review indicates history of iron deficiency anemia. Hemoglobin 13.5 on admission. Provided with IV iron in the past. No signs symptoms of active bleeding -Hemoglobin stable -Monitor  Steroid use. He was placed on steroid taper by his PCP last month after a fall. He got down to 0.5 daily. This was increased since secondary to recent hospitalization. -Decadron 2 mg by mouth  until August 11  Meningioma. Recurrent. Wife states he's had 2 surgery and radiation in the past.   Code Status: I addressed the CODE STATUS again today with the patient and his wife, he wants to remain full CODE STATUS DVT Prophylaxis:  heparin gtt Family Communication: Discussed in detail with the patient, all imaging results, lab results explained to the patient and his wife at the bedside Disposition Plan:   Time Spent in minutes 25 minutes  Procedures:  CT angiogram chest CT abdomen  Consultants:   General  surgery  Antimicrobials:   IV Zosyn 8/5   Medications  Scheduled Meds: . benzonatate  100 mg Oral TID  . dexamethasone  2 mg Oral Daily  . famotidine  20 mg Oral Q12H  . lamoTRIgine  100 mg Oral BID  . levETIRAcetam  750 mg Oral BID  . pantoprazole  40 mg Oral Q0600  . piperacillin-tazobactam (ZOSYN)  IV  3.375 g Intravenous Q8H  . senna-docusate  1 tablet Oral BID  . tamsulosin  0.4 mg Oral QPC supper   Continuous Infusions: . sodium chloride 100 mL/hr at 04/24/16 1700  . heparin 1,250 Units/hr (04/25/16 0533)   PRN Meds:.albuterol, barrier cream, gi cocktail, guaiFENesin, menthol-cetylpyridinium   Antibiotics   Anti-infectives    Start     Dose/Rate Route Frequency Ordered Stop   04/24/16 2230  fluconazole (DIFLUCAN) IVPB 200 mg     200 mg 100 mL/hr over 60 Minutes Intravenous  Once 04/24/16 2143 04/25/16 0217   04/23/16 1400  piperacillin-tazobactam (ZOSYN) IVPB 3.375 g    Comments:  Zosyn 3.375 g IV q8h for CrCl > 20 mL/min   3.375 g 12.5 mL/hr over 240 Minutes Intravenous Every 8 hours 04/23/16 0605     04/23/16 0530  piperacillin-tazobactam (ZOSYN) IVPB 3.375 g     3.375 g 12.5 mL/hr over 240 Minutes Intravenous  Once 04/23/16 0520 04/23/16 1002   04/23/16 0400  piperacillin-tazobactam (ZOSYN) IVPB 3.375 g  Status:  Discontinued     3.375 g 100 mL/hr over 30 Minutes Intravenous  Once 04/23/16 0354 04/23/16 0355        Subjective:   [name] was seen and examined today.  Denies any specific complaints. G-tube has been leaking. Patient is tolerating full liquid diet without any difficulty. No chest pain or shortness of breath. Patient denies dizziness, chest pain, shortness of breath, new weakness, numbess, tingling. No acute events overnight.    Objective:   Vitals:   04/24/16 1500 04/24/16 2030 04/25/16 0100 04/25/16 0504  BP: (!) 110/55 133/87 126/63 136/63  Pulse: 76 86 77 79  Resp: 18 18 18 20   Temp: 98.2 F (36.8 C) 98.5 F (36.9 C) 97.8 F  (36.6 C) 97.9 F (36.6 C)  TempSrc: Oral Oral Oral Oral  SpO2: 96% 98%  97%  Weight:    84.6 kg (186 lb 9.6 oz)  Height:        Intake/Output Summary (Last 24 hours) at 04/25/16 1011 Last data filed at 04/24/16 1819  Gross per 24 hour  Intake          1666.63 ml  Output                0 ml  Net          1666.63 ml     Wt Readings from Last 3 Encounters:  04/25/16 84.6 kg (186 lb 9.6 oz)  04/22/16 98 kg (216 lb)  04/20/16 98 kg (216 lb)  Exam  General: Alert and oriented x 3, NAD  HEENT:   Neck: Supple, no JVD, no masses  Cardiovascular: S1 S2clear, RRR  Respiratory: CTAB  Gastrointestinal: Soft, nontender,G-tube in place  Ext: no cyanosis clubbing or edema  Neuro: no new deficits  Skin:   Psych: Normal affect and demeanor, alert and oriented x3    Data Reviewed:  I have personally reviewed following labs and imaging studies  Micro Results Recent Results (from the past 240 hour(s))  Blood Culture (routine x 2)     Status: None (Preliminary result)   Collection Time: 04/23/16  4:21 AM  Result Value Ref Range Status   Specimen Description BLOOD RIGHT ANTECUBITAL  Final   Special Requests IN PEDIATRIC BOTTLE 4CC  Final   Culture NO GROWTH 1 DAY  Final   Report Status PENDING  Incomplete  Blood Culture (routine x 2)     Status: None (Preliminary result)   Collection Time: 04/23/16  4:35 AM  Result Value Ref Range Status   Specimen Description BLOOD LEFT ANTECUBITAL  Final   Special Requests IN PEDIATRIC BOTTLE 4CC  Final   Culture NO GROWTH 1 DAY  Final   Report Status PENDING  Incomplete  Urine culture     Status: Abnormal   Collection Time: 04/23/16 12:21 PM  Result Value Ref Range Status   Specimen Description URINE, RANDOM  Final   Special Requests NONE  Final   Culture MULTIPLE SPECIES PRESENT, SUGGEST RECOLLECTION (A)  Final   Report Status 04/24/2016 FINAL  Final  MRSA PCR Screening     Status: None   Collection Time: 04/23/16  4:33 PM   Result Value Ref Range Status   MRSA by PCR NEGATIVE NEGATIVE Final    Comment:        The GeneXpert MRSA Assay (FDA approved for NASAL specimens only), is one component of a comprehensive MRSA colonization surveillance program. It is not intended to diagnose MRSA infection nor to guide or monitor treatment for MRSA infections.     Radiology Reports Ct Angio Chest Pe W Or Wo Contrast  Result Date: 04/23/2016 CLINICAL DATA:  Pulmonary emboli identified in the right lower lobe on CT abdomen performed earlier today. Patient is status post recent laparotomy with gastric perforation repair and placement of gastrostomy tube. EXAM: CT ANGIOGRAPHY CHEST WITH CONTRAST TECHNIQUE: Multidetector CT imaging of the chest was performed using the standard protocol during bolus administration of intravenous contrast. Multiplanar CT image reconstructions and MIPs were obtained to evaluate the vascular anatomy. CONTRAST:  80 cc Isovue 370 COMPARISON:  CT abdomen from earlier today. FINDINGS: Mediastinum/Lymph Nodes: Moderate to large amount of pulmonary embolus is seen at the distal aspects of each main pulmonary artery extending into the central segmental pulmonary artery branches to each lobe bilaterally. Nearly occlusive thrombus is seen within the bilateral intralobar pulmonary artery branches extending into multiple segmental pulmonary artery branches the lower lobes bilaterally. There is occlusive pulmonary embolus within the proximal aspects of the lingular pulmonary artery branch. Thoracic aorta is normal in caliber. Scattered atherosclerotic changes noted along the walls of the thoracic aorta. Heart size is normal. No pericardial effusion. Coronary artery calcifications noted. No masses or enlarged lymph nodes seen within the mediastinum or perihilar regions. As noted on the earlier CT abdomen, there is a circumscribed-appearing collection within the lower mediastinum, adjacent to the anterior margin of the  lower esophagus, measuring 2.8 x 1.7 cm, suspicious for early developing abscess. Lungs/Pleura: Small left pleural effusion  with adjacent atelectasis. Trace right pleural effusion with adjacent atelectasis. Small ill-defined ground-glass opacities within the upper lobes bilaterally, likely mild edema. Pulmonary nodule within the medial aspects of the right upper lobe measures 1.3 x 1.1 cm (series 6, image 36). Additional nodule within the lingula measures 6 mm (series 6, image 41). Upper abdomen: Small layering stones within the otherwise normal-appearing gallbladder. Small low-density collection adjacent to the lateral margin of the stomach cardia, as described on the earlier abdomen CT. Musculoskeletal: No acute or suspicious osseous finding. Mild degenerative spurring within the thoracic spine. Mild compression deformity of an upper thoracic vertebral body appears stable. Superficial soft tissues are unremarkable. Review of the MIP images confirms the above findings. IMPRESSION: 1. Moderate to large amount of pulmonary emboli bilaterally. This includes occlusive pulmonary embolus within the lingular pulmonary artery branch and nearly occlusive pulmonary emboli within the distal aspects of each main pulmonary artery extending into multiple segmental lower lobe pulmonary artery branches bilaterally. There is CT evidence of right heart strain (with RV/LV Ratio greater than 1) consistent with at least submassive (intermediate risk) PE. The presence of right heart strain has been associated with an increased risk of morbidity and mortality. Please activate Code PE by paging [phone]. 2. Small left pleural effusion and trace right pleural effusion. 3. Pulmonary nodule within the right upper lobe measuring 1.3 x 1.1 cm. This is new compared to an older chest CT of 04/06/2009. Neoplastic pulmonary nodule is not excluded. Consider further characterization with PET-CT after current issues are resolved. 4. Circumscribed  fluid density collection within the lower mediastinum, adjacent to the anterior margin of the distal esophagus, measuring 2.8 x 1.7 cm, better demonstrated on the CT abdomen performed earlier today. On the earlier CT abdomen, this had an appearance suspicious for early developing abscess. Additional possible abscess versus expected postoperative fluid collection was also appreciated adjacent to the stomach cardia. 5. Aortic atherosclerosis. These results were called by telephone at the time of interpretation on 04/23/2016 at 3:24 p.m. to Dr. Denton Brick, who verbally acknowledged these results. Electronically Signed   By: Franki Cabot M.D.   On: 04/23/2016 15:31   Ct Abdomen Pelvis W Contrast  Result Date: 04/23/2016 CLINICAL DATA:  Discharge from hospital on July 28th after abdominal surgery. Now with sharp pains at surgical site. Hematemesis last night. Status post recent laparotomy with repair of gastric perforation and placement of gastrostomy tube. EXAM: CT ABDOMEN AND PELVIS WITH CONTRAST TECHNIQUE: Multidetector CT imaging of the abdomen and pelvis was performed using the standard protocol following bolus administration of intravenous contrast. CONTRAST:  151mL ISOVUE-300 IOPAMIDOL (ISOVUE-300) INJECTION 61% COMPARISON:  CT abdomen status 04/04/2016 09/11/2015. FINDINGS: Lower chest: Small left pleural effusion and mild bibasilar atelectasis. Multiple acute appearing pulmonary emboli are seen within the segmental pulmonary artery branches to the right lower lobe. Hepatobiliary: Small layering stones within the otherwise normal-appearing gallbladder. Liver appears normal. Pancreas: Partially infiltrated with fat but otherwise unremarkable. Spleen: Within normal limits in size and appearance. Adrenals/Urinary Tract: Adrenal glands appear normal. Kidneys appear normal without mass, stone or hydronephrosis. No ureteral or bladder calculi identified. Bladder appears normal. Stomach/Bowel: Small hiatal hernia,  significantly improved in appearance compared to the recent CT of 04/04/2016. There is a circumscribed fluid collection within the lower mediastinum, just to the left of the anterior margin of the lower esophagus, measuring 2.7 x 1.6 cm, suspicious for early developing abscess. Additional smaller less well circumscribed low-density collection is seen adjacent to the stomach cardia, measuring  approximately 2.6 x 1.5 cm, possible additional early abscess formation. No dilated large or small bowel loops. Gastrostomy tube appears well positioned. Scattered diverticulosis within the descending and sigmoid colon without evidence of acute diverticulitis. Vascular/Lymphatic: Scattered atherosclerotic changes of the normal caliber abdominal aorta. No enlarged lymph nodes seen. Reproductive: No mass or other significant abnormality. Other: No free intraperitoneal air. Musculoskeletal: Degenerative changes throughout the slightly scoliotic thoracolumbar spine. No acute or suspicious osseous finding. IMPRESSION: 1. Interval surgical changes of the stomach, compatible with the given history of recent repair of gastric perforation and placement of gastrostomy tube. Circumscribed fluid density collection within the lower mediastinum, chest to the left of the anterior margin of the lower esophagus, measuring 2.7 x 1.6 cm, possibly expected postoperative fluid collection but suspicious for early developing abscess collection. Associated perforation cannot be excluded, but no extraluminal air to confirm. 2. Poorly defined low-density collection adjacent to the stomach cardia, measuring approximately 2.6 x 1.5 cm, expected postsurgical fluid versus additional early developing abscess. 3. Acute appearing pulmonary emboli within segmental pulmonary artery branches to the right lower lobe. Would consider CT pulmonary angiogram for more complete characterization of the pulmonary arteries. 4. Gastrostomy tube appears well positioned. 5. No  bowel obstruction.  No free intraperitoneal air. 6. Colonic diverticulosis without evidence of acute diverticulitis. 7. Cholelithiasis without evidence of acute cholecystitis. 8. Aortic atherosclerosis. 9. Small left pleural effusion. Critical Value/emergent results were called by telephone at the time of interpretation on 04/23/2016 at 12:22 pm to Dr. Johnney Killian, who verbally acknowledged these results. Electronically Signed   By: Franki Cabot M.D.   On: 04/23/2016 12:24   Ct Abdomen Pelvis W Contrast  Result Date: 04/04/2016 CLINICAL DATA:  Abdominal and back pain EXAM: CT ABDOMEN AND PELVIS WITH CONTRAST TECHNIQUE: Multidetector CT imaging of the abdomen and pelvis was performed using the standard protocol following bolus administration of intravenous contrast. CONTRAST:  169mL ISOVUE-300 IOPAMIDOL (ISOVUE-300) INJECTION 61% COMPARISON:  09/11/2015 FINDINGS: Lower chest and abdominal wall: There is a chronic large hiatal hernia with intrathoracic stomach. The GE junction is above the hiatus with chronic organoaxial volvulus. There is extraluminal gas, fat inflammation, and fluid around the stomach (which is nearly entirely visualized on the delayed phase). The stomach is not overdistended to suggest obstruction. No necrotic segment of bowel or pneumatosis is seen. Gastric vessels normally opacify within the hernia sac. Umbilical hernia containing nonobstructed bowel. Hepatobiliary: No focal liver abnormality.Cholelithiasis. No evidence of biliary obstruction or inflammation. Pancreas: Unremarkable. Spleen: Unremarkable. Adrenals/Urinary Tract: Negative adrenals. No hydronephrosis or stone. Unremarkable bladder. Stomach/Bowel: Bowel findings described above. Colonic diverticulosis. Reproductive:Fiducial markers in the central right aspect of the prostate gland which has a stable CT appearance. No pelvic adenopathy. Vascular/Lymphatic: Extensive atherosclerotic calcification. No acute vascular abnormality. No mass  or adenopathy. Musculoskeletal: Advanced lumbar facet arthropathy with L4-5 and L5-S1 anterolisthesis. Multilevel disc degeneration and spondylosis. Critical Value/emergent results were called by telephone at the time of interpretation on 04/04/2016 at 10:03 pm to Dr. Virgel Manifold , who verbally acknowledged these results. IMPRESSION: 1. Bowel perforation, presumably gastric, with extraluminal gas, fluid, and inflammation in a hiatal hernia sac. There is chronic intrathoracic stomach herniation with organoaxial volvulus. Despite the volvulus, the stomach is not obstructed or distended. 2. Bowel containing umbilical hernia without related obstruction. 3. Cholelithiasis. Electronically Signed   By: Monte Fantasia M.D.   On: 04/04/2016 22:06   Dg Chest Port 1 View  Result Date: 04/23/2016 CLINICAL DATA:  82 year old male with cough and  hypotension EXAM: PORTABLE CHEST 1 VIEW COMPARISON:  Chest radiograph dated 04/08/2016 FINDINGS: Single portable view of chest demonstrates emphysematous changes of the lungs. There is mild eventration of the left hemidiaphragm. No focal consolidation, pleural effusion, or pneumothorax. Stable cardiac silhouette. Osteopenia with degenerative changes of the shoulders. No acute osseous pathology. IMPRESSION: No active disease. Electronically Signed   By: Anner Crete M.D.   On: 04/23/2016 04:56   Dg Chest Port 1 View  Result Date: 04/08/2016 CLINICAL DATA:  Increased wheezing and shortness of breath EXAM: PORTABLE CHEST 1 VIEW COMPARISON:  04/06/2016 FINDINGS: Chronic cardiopericardial enlargement. Stable positioning of central line with tip over the SVC. Improved lung volumes despite interval extubation. Persistent bibasilar opacity that is likely atelectasis. The left diaphragm is now visible. No effusion or pneumothorax. IMPRESSION: Improved lung volumes and decreased atelectasis after extubation. Electronically Signed   By: Monte Fantasia M.D.   On: 04/08/2016 05:52   Dg  Chest Port 1 View  Result Date: 04/06/2016 CLINICAL DATA:  Intubation. EXAM: PORTABLE CHEST 1 VIEW COMPARISON:  04/05/2016. FINDINGS: Endotracheal tube 1.8 cm above the carina. Proximal repositioning of approximately 1 -2 cm should be considered. Right IJ line stable position. Heart size stable. Low lung volumes with persist bibasilar atelectasis and/or infiltrates. Small bilateral pleural effusions cannot be excluded. No pneumothorax. IMPRESSION: 1. Endotracheal tube 1.8 cm above the carina. Proximal repositioning of approximately 1-2 cm should be considered . Right IJ line in stable position. 2. Low lung volumes with persistent bibasilar atelectasis and/or infiltrates. Small bilateral pleural effusions cannot be excluded . Electronically Signed   By: Marcello Moores  Register   On: 04/06/2016 06:46   Dg Chest Port 1 View  Result Date: 04/05/2016 CLINICAL DATA:  Postop.  Endotracheal tube. EXAM: PORTABLE CHEST 1 VIEW COMPARISON:  12/31/2015 FINDINGS: Low volume chest with streaky opacity greater on the left. Interstitial coarsening. Stable heart size. Central line on the right with tip at the SVC level. No pneumothorax. IMPRESSION: 1. Endotracheal tube and right IJ line in good position. No pneumothorax. 2. Postoperative atelectasis and pulmonary venous congestion. Electronically Signed   By: Monte Fantasia M.D.   On: 04/05/2016 03:26   Dg Swallowing Func-speech Pathology  Result Date: 04/13/2016 Objective Swallowing Evaluation: Type of Study: MBS-Modified Barium Swallow Study Patient Details Name: [name] MRN: [id number] Date of Birth: 1931/05/13 Today's Date: 04/13/2016 Time: SLP Start Time (ACUTE ONLY): 1430-SLP Stop Time (ACUTE ONLY): 1500 SLP Time Calculation (min) (ACUTE ONLY): 30 min Past Medical History: Past Medical History: Diagnosis Date . Anemia  . Aortic insufficiency 08/11/2015 . Basal cell cancer  . Brain tumor ([place])  . GERD (gastroesophageal reflux disease)  . Herniated disc   lumbar spine .  Hiatal hernia  . History of radiation therapy   back approximately 2010 prostate radiation under care of Dr. Valere Dross  . Hyperlipidemia  . Hypertension  . Meningioma ([place])   brain . Nocturia  . Occult blood in stools  . OSA (obstructive sleep apnea) 10/28/2014  Moderate with AHI 21/hr . Prostate cancer ([place])  . Restless leg  . Seizures ([place])  Past Surgical History: Past Surgical History: Procedure Laterality Date . APPLICATION OF CRANIAL NAVIGATION N/A [id number]  Procedure: APPLICATION OF CRANIAL NAVIGATION;  Surgeon: Consuella Lose, MD;  Location: [place] [facility];  Service: Neurosurgery;  Laterality: N/A;  APPLICATION OF CRANIAL NAVIGATION . Bilateral cateract surgery   . COLONOSCOPY    colon polyp removed . CRANIOTOMY Right 09/27/2013  Procedure: RIGHT FRONTAL CRANIOTOMY FOR  TUMOR RESECTION ;  Surgeon: Consuella Lose, MD;  Location: [place] [facility];  Service: Neurosurgery;  Laterality: Right; . CRANIOTOMY Right 02/17/2015  Procedure: Right frontal parietal craniotomy for resection of meningioma with brainlab;  Surgeon: Consuella Lose, MD;  Location: [place] [facility];  Service: Neurosurgery;  Laterality: Right;  Right frontal parietal craniotomy for resection of meningioma with brainlab . JOINT REPLACEMENT    right knee replacement . LAPAROTOMY N/A 04/04/2016  Procedure: EXPLORATORY LAPAROTOMY, REPAIR OF GASTRIC PERFORATION WITH OMENTAL PATCH, PLACEMENT OF GASTROSTOMY TUBE;  Surgeon: Autumn Messing III, MD;  Location: [facility];  Service: General;  Laterality: N/A; . PROSTATE BIOPSY   . TONSILLECTOMY    Agae 6-7 . VASECTOMY   HPI: 82 yo male adm to [facility] with abdominal pain - pt is s/p surgery with PEG placed 7/17 with intubation a few days.  Per wife, pt has been in ICU and has not had po until yesterday.   Pt started po intake yesteday and was coughing with liquids, therefore diet was changed to nectar liquids and SLP ordered.   Diet was advanced to regular/thin today.  Pt PMH + for meningioma s/p XRT and surgery, anemia, seizures.   Pt CXR shows atelectasis.   Subjective: pt seen in radiology for MBS Assessment / Plan / Recommendation CHL IP CLINICAL IMPRESSIONS 04/13/2016 Therapy Diagnosis Mild oral phase dysphagia;Mild pharyngeal phase dysphagia Clinical Impression Pt presents with mild oropharyngeal dysphagia, characterized by poor bolus formation of puree and solid with posterior spillage to the vallecula, and right anterior leakage of liquids. Swallow reflex was delayed, with trigger at the vallecula on the initial swallow. Poor airway closure resulted in flash penetration of thin liquids during the initial swallow. Nectar thick liquid prevented penetration of the initial swallow, however, Residue of thin and nectar thick liquid on tongue base and vallecula was noted to spill to the pyriform prior to trigger of (cued) dry swallow.  Small boluses and immediate second swallow prevented penetration of nectar thick liquids. Recommend dys 3 solids with chopped meats and nectar thick liquids, SMALL bites and sips at a SLOW rate, with 2 swallows per bite/sip. Meds one at a time with puree. RN was notified of results and recommendations, and safe swallow precautions were posted at [facility]. ST will follow for education and assessment of diet tolerance. Recommend repeat MBS prior to advancing liquids, given lack of reflexive response to penetration on this study. Impact on safety and function Risk for inadequate nutrition/hydration;Moderate aspiration risk;Mild aspiration risk   CHL IP TREATMENT RECOMMENDATION 04/13/2016 Treatment Recommendations Therapy as outlined in treatment plan below   Prognosis 04/13/2016 Prognosis for Safe Diet Advancement Guarded Barriers to Reach Goals Motivation CHL IP DIET RECOMMENDATION 04/13/2016 SLP Diet Recommendations Dysphagia 3 (Mech soft) solids;Nectar thick liquid Liquid Administration via No straw;Cup Medication Administration Whole meds with puree Compensations Slow rate;Small sips/bites;Clear throat  intermittently;Other (Comment);Minimize environmental distractions;Multiple dry swallows after each bite/sip Postural Changes Seated upright at 90 degrees   CHL IP OTHER RECOMMENDATIONS 04/13/2016 Recommended Consults -- Oral Care Recommendations Oral care BID Other Recommendations Order thickener from pharmacy;Prohibited food (jello, ice cream, thin soups);Remove water pitcher   CHL IP FOLLOW UP RECOMMENDATIONS 04/12/2016 Follow up Recommendations To be determined   CHL IP FREQUENCY AND DURATION 04/13/2016 Speech Therapy Frequency (ACUTE ONLY) min 2x/week Treatment Duration 1 week    CHL IP ORAL PHASE 04/13/2016 Oral Phase Impaired Oral - Nectar Cup Right anterior bolus loss Oral - Thin Cup Right anterior bolus loss Oral - Puree  Reduced posterior propulsion;Delayed oral transit;Premature spillage Oral - Multi-Consistency Premature spillage;Delayed oral transit;Reduced posterior propulsion  CHL IP PHARYNGEAL PHASE 04/13/2016 Pharyngeal Phase Impaired Pharyngeal- Nectar Cup Delayed swallow initiation-vallecula;Reduced pharyngeal peristalsis;Reduced anterior laryngeal mobility;Reduced laryngeal elevation;Reduced airway/laryngeal closure;Reduced tongue base retraction;Penetration/Aspiration during swallow;Pharyngeal residue - valleculae;Delayed swallow initiation-pyriform sinuses Pharyngeal Material enters airway, remains ABOVE vocal cords then ejected out Pharyngeal- Thin Cup Delayed swallow initiation-vallecula;Delayed swallow initiation-pyriform sinuses;Reduced pharyngeal peristalsis;Reduced anterior laryngeal mobility;Reduced laryngeal elevation;Reduced airway/laryngeal closure;Pharyngeal residue - valleculae;Reduced tongue base retraction;Penetration/Aspiration during swallow Pharyngeal Material enters airway, remains ABOVE vocal cords then ejected out Pharyngeal- Puree Delayed swallow initiation-vallecula;Reduced tongue base retraction Pharyngeal- Multi-consistency Delayed swallow initiation-vallecula;Reduced tongue  base retraction  CHL IP CERVICAL ESOPHAGEAL PHASE 04/13/2016 Cervical Esophageal Phase [facility] Celia B. [place], [facility], CCC-SLP [id number] Shonna Chock 04/13/2016, 3:19 PM               Lab Data:  CBC:  Recent Labs Lab 04/23/16 0421 04/23/16 0445 04/23/16 0723 04/24/16 0519 04/25/16 0410  WBC 14.2*  --  13.5* 10.8* 9.0  NEUTROABS 11.6*  --   --   --   --   HGB 13.4 14.6 13.5 11.1* 10.7*  HCT 43.6 43.0 43.2 36.7* 35.2*  MCV 91.6  --  90.6 92.0 91.0  PLT 277  --  239 283 [id number]   Basic Metabolic Panel:  Recent Labs Lab 04/23/16 0421 04/23/16 0445 04/24/16 0519 04/25/16 0410  NA 135 136 135 135  K 3.8 3.7 4.2 3.4*  CL 100* 99* 101 104  CO2 24  --  23 24  GLUCOSE 110* 106* 103* 86  BUN 29* 30* 15 11  CREATININE 1.08 1.00 0.91 0.92  CALCIUM 8.7*  --  8.2* 8.0*   GFR: Estimated Creatinine Clearance: 72.8 mL/min (by C-G formula based on SCr of 0.92 mg/dL). Liver Function Tests:  Recent Labs Lab 04/23/16 0421  AST 31  ALT 30  ALKPHOS 72  BILITOT 0.5  PROT 5.8*  ALBUMIN 2.4*   No results for input(s): LIPASE, AMYLASE in the last 168 hours. No results for input(s): AMMONIA in the last 168 hours. Coagulation Profile:  Recent Labs Lab 04/23/16 1337  INR 1.10   Cardiac Enzymes: No results for input(s): CKTOTAL, CKMB, CKMBINDEX, TROPONINI in the last 168 hours. BNP (last 3 results) No results for input(s): PROBNP in the last 8760 hours. HbA1C: No results for input(s): HGBA1C in the last 72 hours. CBG: No results for input(s): GLUCAP in the last 168 hours. Lipid Profile: No results for input(s): CHOL, HDL, LDLCALC, TRIG, CHOLHDL, LDLDIRECT in the last 72 hours. Thyroid Function Tests: No results for input(s): TSH, T4TOTAL, FREET4, T3FREE, THYROIDAB in the last 72 hours. Anemia Panel: No results for input(s): VITAMINB12, FOLATE, FERRITIN, TIBC, IRON, RETICCTPCT in the last 72 hours. Urine analysis:    Component Value Date/Time   COLORURINE YELLOW 04/23/2016 1221    APPEARANCEUR TURBID (A) 04/23/2016 1221   LABSPEC 1.045 (H) 04/23/2016 1221   PHURINE 7.0 04/23/2016 1221   GLUCOSEU NEGATIVE 04/23/2016 1221   HGBUR NEGATIVE 04/23/2016 1221   BILIRUBINUR NEGATIVE 04/23/2016 1221   KETONESUR NEGATIVE 04/23/2016 1221   PROTEINUR 30 (A) 04/23/2016 1221   UROBILINOGEN 0.2 07/31/2014 1941   NITRITE NEGATIVE 04/23/2016 1221   LEUKOCYTESUR NEGATIVE 04/23/2016 1221     Arrie Zuercher M.D. [facility] 04/25/2016, 10:11 AM  Pager: [phone] Between 7am to 7pm - call Pager - [phone]  After 7pm go to [email or web address] - password [email or web address]  Call night coverage person covering after  7pm

## 2016-04-25 NOTE — Progress Notes (Signed)
Spoke with General surgery provider on call Dr. Christie Beckers. Notified him about the excessive amount of drainage coming from around the insertion site. Also that the skin was raw from the drainage as well as it appears to be bleeding from under the Duoderm that's covering the insertion site.  He advised me not to take the Duoderm off since he is on a Heparin drip and to connect the foley catheter that is coming out from the site to a foley drainage bag. He stated this should decrease the amount of drainage coming from around the site and to apply Barrier cream where the skin breakdown is and to keep a dry dressing on it.  Drainage bag applied as instructed and at first there was pink-tinged fluid that came into catheter but then changed to a dark brown color with small clots. Cleansed the site with mild soap and water and applied barrier cream as instructed. Covered with ABD pads to keep dry. WOC consulted changed to STAT so they can assess first thing in the morning. Patient states it doesn't hurt as bad as it itches. Advised patient not to scratch it. Will continue to monitor color and amount of output as well as keep site as clean and dry as possible to prevent further break down.

## 2016-04-25 NOTE — Consult Note (Signed)
[place] Nurse ostomy consult note Stoma type/location: LUQ G tube site  Peristomal assessment: Erythema, itching and burning to skin around g tube site opening.  Treatment options for stomal/peristomal skin: WIll begin Nystatin cream.  Silicone border foam dressing.  Output Serosanguinous drainage Education provided: Will begin antifungal Nystatin cream.  Keep dressing dry and intact to skin around G tube.  Will not follow at this time.  Please re-consult if needed.  Domenic Moras RN BSN [place] Pager [phone]

## 2016-04-25 NOTE — Progress Notes (Signed)
Subjective: Still having leaking from around g tube. g tube to gravity. Nurse at bs cleaning G tube site. Tolerating liquids without n/v. Drinking about 1/2 tray.   Objective: Vital signs in last 24 hours: Temp:  [97.5 F (36.4 C)-98.5 F (36.9 C)] 97.5 F (36.4 C) (08/07 1227) Pulse Rate:  [76-86] 79 (08/07 0504) Resp:  [18-20] 20 (08/07 0504) BP: (110-136)/(55-87) 128/68 (08/07 1227) SpO2:  [96 %-98 %] 96 % (08/07 1227) Weight:  [84.6 kg (186 lb 9.6 oz)] 84.6 kg (186 lb 9.6 oz) (08/07 0504) Last BM Date: 04/22/16  Intake/Output from previous day: 08/06 0701 - 08/07 0700 In: 2597.5 [P.O.:756; I.V.:1541.5; IV Piggyback:300] Out: -  Intake/Output this shift: Total I/O In: 360 [P.O.:360] Out: -   Alert, nontoxic Obese, soft, G tube intact; extensive skin irritation around LUQ skin  Lab Results:   Recent Labs  04/24/16 0519 04/25/16 0410  WBC 10.8* 9.0  HGB 11.1* 10.7*  HCT 36.7* 35.2*  PLT 283 244   BMET  Recent Labs  04/24/16 0519 04/25/16 0410  NA 135 135  K 4.2 3.4*  CL 101 104  CO2 23 24  GLUCOSE 103* 86  BUN 15 11  CREATININE 0.91 0.92  CALCIUM 8.2* 8.0*   PT/INR  Recent Labs  04/23/16 1337  LABPROT 14.3  INR 1.10   ABG No results for input(s): PHART, HCO3 in the last 72 hours.  Invalid input(s): PCO2, PO2  Studies/Results: Ct Angio Chest Pe W Or Wo Contrast  Result Date: 04/23/2016 CLINICAL DATA:  Pulmonary emboli identified in the right lower lobe on CT abdomen performed earlier today. Patient is status post recent laparotomy with gastric perforation repair and placement of gastrostomy tube. EXAM: CT ANGIOGRAPHY CHEST WITH CONTRAST TECHNIQUE: Multidetector CT imaging of the chest was performed using the standard protocol during bolus administration of intravenous contrast. Multiplanar CT image reconstructions and MIPs were obtained to evaluate the vascular anatomy. CONTRAST:  80 cc Isovue 370 COMPARISON:  CT abdomen from earlier today.  FINDINGS: Mediastinum/Lymph Nodes: Moderate to large amount of pulmonary embolus is seen at the distal aspects of each main pulmonary artery extending into the central segmental pulmonary artery branches to each lobe bilaterally. Nearly occlusive thrombus is seen within the bilateral intralobar pulmonary artery branches extending into multiple segmental pulmonary artery branches the lower lobes bilaterally. There is occlusive pulmonary embolus within the proximal aspects of the lingular pulmonary artery branch. Thoracic aorta is normal in caliber. Scattered atherosclerotic changes noted along the walls of the thoracic aorta. Heart size is normal. No pericardial effusion. Coronary artery calcifications noted. No masses or enlarged lymph nodes seen within the mediastinum or perihilar regions. As noted on the earlier CT abdomen, there is a circumscribed-appearing collection within the lower mediastinum, adjacent to the anterior margin of the lower esophagus, measuring 2.8 x 1.7 cm, suspicious for early developing abscess. Lungs/Pleura: Small left pleural effusion with adjacent atelectasis. Trace right pleural effusion with adjacent atelectasis. Small ill-defined ground-glass opacities within the upper lobes bilaterally, likely mild edema. Pulmonary nodule within the medial aspects of the right upper lobe measures 1.3 x 1.1 cm (series 6, image 36). Additional nodule within the lingula measures 6 mm (series 6, image 41). Upper abdomen: Small layering stones within the otherwise normal-appearing gallbladder. Small low-density collection adjacent to the lateral margin of the stomach cardia, as described on the earlier abdomen CT. Musculoskeletal: No acute or suspicious osseous finding. Mild degenerative spurring within the thoracic spine. Mild compression deformity of an upper  thoracic vertebral body appears stable. Superficial soft tissues are unremarkable. Review of the MIP images confirms the above findings. IMPRESSION:  1. Moderate to large amount of pulmonary emboli bilaterally. This includes occlusive pulmonary embolus within the lingular pulmonary artery branch and nearly occlusive pulmonary emboli within the distal aspects of each main pulmonary artery extending into multiple segmental lower lobe pulmonary artery branches bilaterally. There is CT evidence of right heart strain (with RV/LV Ratio greater than 1) consistent with at least submassive (intermediate risk) PE. The presence of right heart strain has been associated with an increased risk of morbidity and mortality. Please activate Code PE by paging [phone]. 2. Small left pleural effusion and trace right pleural effusion. 3. Pulmonary nodule within the right upper lobe measuring 1.3 x 1.1 cm. This is new compared to an older chest CT of 04/06/2009. Neoplastic pulmonary nodule is not excluded. Consider further characterization with PET-CT after current issues are resolved. 4. Circumscribed fluid density collection within the lower mediastinum, adjacent to the anterior margin of the distal esophagus, measuring 2.8 x 1.7 cm, better demonstrated on the CT abdomen performed earlier today. On the earlier CT abdomen, this had an appearance suspicious for early developing abscess. Additional possible abscess versus expected postoperative fluid collection was also appreciated adjacent to the stomach cardia. 5. Aortic atherosclerosis. These results were called by telephone at the time of interpretation on 04/23/2016 at 3:24 p.m. to Dr. Denton Brick, who verbally acknowledged these results. Electronically Signed   By: Franki Cabot M.D.   On: 04/23/2016 15:31    Anti-infectives: Anti-infectives    Start     Dose/Rate Route Frequency Ordered Stop   04/24/16 2230  fluconazole (DIFLUCAN) IVPB 200 mg     200 mg 100 mL/hr over 60 Minutes Intravenous  Once 04/24/16 2143 04/25/16 0217   04/23/16 1400  piperacillin-tazobactam (ZOSYN) IVPB 3.375 g    Comments:  Zosyn 3.375 g IV q8h  for CrCl > 20 mL/min   3.375 g 12.5 mL/hr over 240 Minutes Intravenous Every 8 hours 04/23/16 0605     04/23/16 0530  piperacillin-tazobactam (ZOSYN) IVPB 3.375 g     3.375 g 12.5 mL/hr over 240 Minutes Intravenous  Once 04/23/16 0520 04/23/16 1002   04/23/16 0400  piperacillin-tazobactam (ZOSYN) IVPB 3.375 g  Status:  Discontinued     3.375 g 100 mL/hr over 30 Minutes Intravenous  Once 04/23/16 0354 04/23/16 0355      Assessment/Plan: s/p  HH repair with placement of G tube, now admitted for PE on heparin drip -continue anticoagulation per medicine  Leaking G tube -appears to be taking adequate PO. Will adv diet to dys 3, nectar thick based on speech note from 7/27 -given significant skin irritation and on-going leaking, I think we just need to remove G tube to prevent ongoing skin breakdown. Small possibility of gastric-cutaneous fistula -wound care consult. Consider Inter-dry sheets for abd skin  Paraesophageal fluid collection -no leukocytosis, no pain with swallowing both go against a developing abscess. Abscess formation would be uncommon with this surgery -recommend empiric abx for 1 week and observation  Leighton Ruff. Redmond Pulling, MD, FACS General, Bariatric, & Minimally Invasive Surgery [facility], [place]   LOS: 2 days    [name] 04/25/2016

## 2016-04-25 NOTE — Progress Notes (Signed)
Patient expressed his desire to be a DNR. States he and his wife have discussed making a living will to express their wishes but keep putting it off. Patient states he is interested in our services while here at the hospital to help create a living will stating his wishes. Will put in an order for Chaplain to help create those documents and pass on to on-coming shift to notify attending in am to change his status.

## 2016-04-25 NOTE — Progress Notes (Signed)
[place] for heparin Indication: pulmonary embolus  Allergies  Allergen Reactions  . Asa [Aspirin] Nausea Only  . Other Itching and Rash    Nuts  . Vicodin [Hydrocodone-Acetaminophen] Nausea Only    Patient Measurements: Height: 6\' 6"  (198.1 cm) Weight: 186 lb 9.6 oz (84.6 kg) IBW/kg (Calculated) : 91.4 Heparin Dosing Weight: 85 kg  Vital Signs: Temp: 97.9 F (36.6 C) (08/07 0504) Temp Source: Oral (08/07 0504) BP: 136/63 (08/07 0504) Pulse Rate: 79 (08/07 0504)  Labs:  Recent Labs  04/23/16 0445 04/23/16 0723 04/23/16 1337 04/23/16 2353 04/24/16 0519 04/25/16 0410  HGB 14.6 13.5  --   --  11.1* 10.7*  HCT 43.0 43.2  --   --  36.7* 35.2*  PLT  --  239  --   --  283 244  LABPROT  --   --  14.3  --   --   --   INR  --   --  1.10  --   --   --   HEPARINUNFRC  --   --   --  0.90* 0.70 0.64  CREATININE 1.00  --   --   --  0.91 0.92    Estimated Creatinine Clearance: 72.8 mL/min (by C-G formula based on SCr of 0.92 mg/dL).  Assessment: 82 y.o. male with R lower lobe PE identified on CT, pharmacy consulted for heparin dosing.  HL therapeutic this am at 0.64 Hgb stabilizing (had G tube replaced 8/5), Pltc stable WNL, no s/sx bleeding noted. Plan is to continue with heparin for now with plans for G-tube removal later in the week. Will plan to change to [facility] once this is complete.  Goal of Therapy:  Heparin level 0.3-0.7 units/ml Monitor platelets by anticoagulation protocol: Yes   Plan:  Continue Heparin IV 1250 units/hr Check anti-Xa level daily while on heparin Continue to monitor H&H and platelets   Thank you for allowing [place] to participate in this patients care. Jens Som, PharmD Pager: [phone] 8/7/201710:54 AM

## 2016-04-25 NOTE — Progress Notes (Signed)
   04/25/16 0900  Clinical Encounter Type  Visited With Patient and family together  Visit Type Initial  Referral From Nurse  Consult/Referral To Chaplain  Spiritual Encounters  Spiritual Needs Other (Comment) (AD)  CHP  Provided AD to patient.  CHP available to follow up when paperwork is complete. Roe Coombs 04/25/16

## 2016-04-26 ENCOUNTER — Inpatient Hospital Stay (HOSPITAL_COMMUNITY): Payer: Medicare Other

## 2016-04-26 LAB — BASIC METABOLIC PANEL
ANION GAP: 7 (ref 5–15)
BUN: 7 mg/dL (ref 6–20)
CALCIUM: 8 mg/dL — AB (ref 8.9–10.3)
CO2: 22 mmol/L (ref 22–32)
Chloride: 105 mmol/L (ref 101–111)
Creatinine, Ser: 0.9 mg/dL (ref 0.61–1.24)
Glucose, Bld: 91 mg/dL (ref 65–99)
Potassium: 3.5 mmol/L (ref 3.5–5.1)
SODIUM: 134 mmol/L — AB (ref 135–145)

## 2016-04-26 LAB — HEPARIN LEVEL (UNFRACTIONATED)
Heparin Unfractionated: 0.85 IU/mL — ABNORMAL HIGH (ref 0.30–0.70)
Heparin Unfractionated: 0.73 IU/mL — ABNORMAL HIGH (ref 0.30–0.70)

## 2016-04-26 LAB — CBC
HEMATOCRIT: 36.3 % — AB (ref 39.0–52.0)
Hemoglobin: 11.1 g/dL — ABNORMAL LOW (ref 13.0–17.0)
MCH: 27.8 pg (ref 26.0–34.0)
MCHC: 30.6 g/dL (ref 30.0–36.0)
MCV: 90.8 fL (ref 78.0–100.0)
Platelets: 245 10*3/uL (ref 150–400)
RBC: 4 MIL/uL — ABNORMAL LOW (ref 4.22–5.81)
RDW: 18.3 % — AB (ref 11.5–15.5)
WBC: 9.1 10*3/uL (ref 4.0–10.5)

## 2016-04-26 NOTE — Plan of Care (Signed)
Problem: Health Behavior/Discharge Planning: Goal: Ability to manage health-related needs will improve Outcome: Progressing Stroke prevention educational material provided. DVT prevention handout given. Pt educated about signs and symptoms of stroke. Importance of anticoagulation reviewed. Pt aware mobility statis needs to improve. Helped today turning self and stood up at bedside.

## 2016-04-26 NOTE — Consult Note (Addendum)
[place] Nurse wound follow up Abd wound is followed by the surgical team. Consult requested for G-tube site maceration.  This was performed yesterday; refer to progress notes for assessment and plan of care.  G-tube has been removed and leakage has decreased.  Site is greatly improved from previous assessment; slight amt redness remains surrounding previous insertion site; approx .2X.2cm opening. Continue present plan of care with antifungal cream and dry gauze dressings to promote healing. Wound type: Abd with full thickness post-op incision with Vac dressing intact and due to be changed. [place] team was not consulted for this site yesterday.  Measurement:  10X2X.2cm Wound bed: beefy red Drainage (amount, consistency, odor) Small amt red drainage, no odor Periwound: Intact skin surrounding Dressing procedure/placement/frequency: Applied one piece black foam to 150mm cont suction.  Pt tolerated with minimal amt discomfort.  Plan for bedside nurses to change dressing Q Tues/Thurs/Sat.  Wound is shallow and will not require Vac upon discharge; please refer to surgical team for further plan of care. Please re-consult if further assistance is needed.  Thank-you,  Julien Girt MSN, [place], [place], [place], [place]

## 2016-04-26 NOTE — Progress Notes (Addendum)
[facility]                                                                              Patient Demographics  [name], is a 82 y.o. male, DOB - Dec 11, 1930, [id number]  Admit date - 04/23/2016   Admitting Physician Paul Denton Brick, MD  Outpatient Primary MD for the patient is  Paul Crutch, MD  Outpatient specialists:   LOS - 3  days    Chief Complaint  Patient presents with  . Hypotension  . Emesis       Brief summary   [name] is a 82 y.o. male with medical history significant for seizures, prostate, hypertension, hyperlipidemia, GERD, aortic insufficiency, prostate cancer status post radiation therapy 2010, perforated gastric ulcer status post surgical repair 1 week ago presents to the emergency Department chief complaint of emesis generalized weakness hypotension. Initial evaluation reveals elevated lactic acid leukocytosis CT of the abdomen concerning for early abscess and/or pulmonary emboli. He states he was doing well at [facility] place after his surgery to weeks  On the day of admission, he had generalized weakness and dizziness and has had difficulty bearing weight but is undergoing physical therapy for that. He also reported persistent productive cough since surgery. On the day of admission, he reported coughing up blood-tinged sputum, brown emesis times one episode. He does have a G-tube which he says has been leaking since surgery. EMS was called to the facility for hypotension. E  Patient was found to have bilateral pulmonary embolism with DVT and right heart strain. General surgery is following closely for the leaking G-tube and recent perforated gastric ulcer repair.   Assessment & Plan   Nausea and vomiting, recent perforated gastric ulcer status post repair on 7/17, para esophageal fluid collection - Currently stable, general surgery following, no nausea or vomiting, patient tolerating clears - Per surgery, continue empiric empiric antibiotics  for 1 week - Lactic acidosis improving  Leaking G-tube - General surgery following, will follow recommendations  - G-tube removed yesterday by general surgery due to the wound and leaking, since then leaking has been decreasing.   - Wound VAC placed at the site, wound care following -  Transition to oral anticoagulation once cleared by general surgery  Bilateral PE with right heart strain and DVT - Continue heparin drip for now - Case management consult regarding NOAC'S - Discussed various options of anticoagulation with the patient, he wishes to be on NOAC's. Will need clearance from general surgery.  Hypertension.  -BP currently stable, continue to hold Avapro   History of seizures/epileptic syndrome, meningioma: No recent seizures. Medications include Keppra and lamictal. Baseline is walking with a walker since surgery unable to ambulate -Continue home meds   Anemia. Chart review indicates history of iron deficiency anemia. Hemoglobin 13.5 on admission. Provided with IV iron in the past. No signs symptoms of active bleeding -Hemoglobin stable -Monitor  Steroid use. He was placed on steroid taper by his PCP last month after a fall. He got down to 0.5 daily. This was increased since secondary to recent hospitalization. -Decadron 2 mg by mouth until August 11  Meningioma.  Recurrent. Wife states he's had 2 surgery and radiation in the past. - CT head done this morning as patient was reporting subtle mild weakness in his left arm while eating breakfast, weakness transient and less than 5 minutes. CT head showed no acute CVA.    Code Status: I addressed the CODE STATUS on 8/7. he wants to remain full CODE STATUS DVT Prophylaxis:  heparin gtt Family Communication: Discussed in detail with the patient, all imaging results, lab results explained to the patient and his wife at the bedside yesterday Disposition Plan:   Time Spent in minutes 25 minutes  Procedures:  CT angiogram  chest CT abdomen  Consultants:   General surgery  Antimicrobials:   IV Zosyn 8/5   Medications  Scheduled Meds: . benzonatate  100 mg Oral TID  . dexamethasone  2 mg Oral Daily  . famotidine  20 mg Oral Q12H  . lamoTRIgine  100 mg Oral BID  . levETIRAcetam  750 mg Oral BID  . nystatin cream   Topical BID  . pantoprazole  40 mg Oral Q0600  . piperacillin-tazobactam (ZOSYN)  IV  3.375 g Intravenous Q8H  . polyethylene glycol  17 g Oral Daily  . rOPINIRole  1.5 mg Oral BID  . senna-docusate  1 tablet Oral BID  . tamsulosin  0.4 mg Oral QPC supper   Continuous Infusions: . sodium chloride 100 mL/hr at 04/24/16 1700  . heparin 1,100 Units/hr (04/26/16 0800)   PRN Meds:.albuterol, bisacodyl, gi cocktail, guaiFENesin, menthol-cetylpyridinium, RESOURCE THICKENUP CLEAR   Antibiotics   Anti-infectives    Start     Dose/Rate Route Frequency Ordered Stop   04/24/16 2230  fluconazole (DIFLUCAN) IVPB 200 mg     200 mg 100 mL/hr over 60 Minutes Intravenous  Once 04/24/16 2143 04/25/16 0217   04/23/16 1400  piperacillin-tazobactam (ZOSYN) IVPB 3.375 g    Comments:  Zosyn 3.375 g IV q8h for CrCl > 20 mL/min   3.375 g 12.5 mL/hr over 240 Minutes Intravenous Every 8 hours 04/23/16 0605     04/23/16 0530  piperacillin-tazobactam (ZOSYN) IVPB 3.375 g     3.375 g 12.5 mL/hr over 240 Minutes Intravenous  Once 04/23/16 0520 04/23/16 1002   04/23/16 0400  piperacillin-tazobactam (ZOSYN) IVPB 3.375 g  Status:  Discontinued     3.375 g 100 mL/hr over 30 Minutes Intravenous  Once 04/23/16 0354 04/23/16 0355        Subjective:   [name] was seen and examined today.  Examined twice this morning. Initially no complaints, subsequently had subtle transient left hand weakness lasting few minutes. No neurological deficits at the time of the examination. Tolerating diet without any difficulty. No chest pain or shortness of breath. Patient denies dizziness, chest pain, shortness of breath,  new weakness, numbess, tingling. No acute events overnight.    Objective:   Vitals:   04/25/16 2347 04/26/16 0419 04/26/16 0807 04/26/16 1152  BP: (!) 153/85 140/75 130/70 125/69  Pulse: 79 70 72 72  Resp:    18  Temp: 97.9 F (36.6 C) 98.4 F (36.9 C) 98 F (36.7 C) 99 F (37.2 C)  TempSrc: Oral Oral Oral Oral  SpO2: 97% 96% 97% 97%  Weight:  84.6 kg (186 lb 8 oz)    Height:        Intake/Output Summary (Last 24 hours) at 04/26/16 1305 Last data filed at 04/26/16 0800  Gross per 24 hour  Intake  4716.67 ml  Output             1675 ml  Net          3041.67 ml     Wt Readings from Last 3 Encounters:  04/26/16 84.6 kg (186 lb 8 oz)  04/22/16 98 kg (216 lb)  04/20/16 98 kg (216 lb)     Exam  General: Alert and oriented x 3, NAD  HEENT:   Neck: Supple, no JVD, no masses  Cardiovascular: S1 S2clear, RRR  Respiratory: CTAB  Gastrointestinal: Soft, nontender, wound vac in place  Ext: no cyanosis clubbing or edema  Neuro: Strength 5/5 in upper and lower extremities, no acute deficits  Skin:   Psych: Normal affect and demeanor, alert and oriented x3    Data Reviewed:  I have personally reviewed following labs and imaging studies  Micro Results Recent Results (from the past 240 hour(s))  Blood Culture (routine x 2)     Status: None (Preliminary result)   Collection Time: 04/23/16  4:21 AM  Result Value Ref Range Status   Specimen Description BLOOD RIGHT ANTECUBITAL  Final   Special Requests IN PEDIATRIC BOTTLE 4CC  Final   Culture NO GROWTH 3 DAYS  Final   Report Status PENDING  Incomplete  Blood Culture (routine x 2)     Status: None (Preliminary result)   Collection Time: 04/23/16  4:35 AM  Result Value Ref Range Status   Specimen Description BLOOD LEFT ANTECUBITAL  Final   Special Requests IN PEDIATRIC BOTTLE 4CC  Final   Culture NO GROWTH 3 DAYS  Final   Report Status PENDING  Incomplete  Urine culture     Status: Abnormal   Collection  Time: 04/23/16 12:21 PM  Result Value Ref Range Status   Specimen Description URINE, RANDOM  Final   Special Requests NONE  Final   Culture MULTIPLE SPECIES PRESENT, SUGGEST RECOLLECTION (A)  Final   Report Status 04/24/2016 FINAL  Final  MRSA PCR Screening     Status: None   Collection Time: 04/23/16  4:33 PM  Result Value Ref Range Status   MRSA by PCR NEGATIVE NEGATIVE Final    Comment:        The GeneXpert MRSA Assay (FDA approved for NASAL specimens only), is one component of a comprehensive MRSA colonization surveillance program. It is not intended to diagnose MRSA infection nor to guide or monitor treatment for MRSA infections.     Radiology Reports Ct Head Wo Contrast  Result Date: 04/26/2016 CLINICAL DATA:  Left arm weakness noticed this morning while eating breakfast. EXAM: CT HEAD WITHOUT CONTRAST TECHNIQUE: Contiguous axial images were obtained from the base of the skull through the vertex without intravenous contrast. COMPARISON:  03/21/2016 brain MRI FINDINGS: Brain: There is known extra axial mass deep to the high right bone flap consistent with meningioma. Multiple areas of marginal nodular disease are poorly seen compared to previous MRI. There is known invasion of the superior sagittal sinus. White matter low-density without notable mass effect in the posterior right frontal, right parietal, and right occipital regions is stable from previous. No superimposed infarct, hemorrhage, or hydrocephalus. Left posterior subdural collection on the previous study is not visualized today. Vascular: Atherosclerotic calcification. Meningioma is known to invade and/or occlude the superior sagittal sinus. Skull: Stable appearance of 2 high right craniotomy sites Sinuses/Orbits: Bilateral cataract resection.  No acute finding. Other: None. IMPRESSION: 1. No acute finding when compared to 03/21/2016 MRI. 2. Multi focal  meningioma along the posterior right cerebral convexity with stable  vasogenic edema and/or white matter gliosis. Electronically Signed   By: Monte Fantasia M.D.   On: 04/26/2016 10:44   Ct Angio Chest Pe W Or Wo Contrast  Result Date: 04/23/2016 CLINICAL DATA:  Pulmonary emboli identified in the right lower lobe on CT abdomen performed earlier today. Patient is status post recent laparotomy with gastric perforation repair and placement of gastrostomy tube. EXAM: CT ANGIOGRAPHY CHEST WITH CONTRAST TECHNIQUE: Multidetector CT imaging of the chest was performed using the standard protocol during bolus administration of intravenous contrast. Multiplanar CT image reconstructions and MIPs were obtained to evaluate the vascular anatomy. CONTRAST:  80 cc Isovue 370 COMPARISON:  CT abdomen from earlier today. FINDINGS: Mediastinum/Lymph Nodes: Moderate to large amount of pulmonary embolus is seen at the distal aspects of each main pulmonary artery extending into the central segmental pulmonary artery branches to each lobe bilaterally. Nearly occlusive thrombus is seen within the bilateral intralobar pulmonary artery branches extending into multiple segmental pulmonary artery branches the lower lobes bilaterally. There is occlusive pulmonary embolus within the proximal aspects of the lingular pulmonary artery branch. Thoracic aorta is normal in caliber. Scattered atherosclerotic changes noted along the walls of the thoracic aorta. Heart size is normal. No pericardial effusion. Coronary artery calcifications noted. No masses or enlarged lymph nodes seen within the mediastinum or perihilar regions. As noted on the earlier CT abdomen, there is a circumscribed-appearing collection within the lower mediastinum, adjacent to the anterior margin of the lower esophagus, measuring 2.8 x 1.7 cm, suspicious for early developing abscess. Lungs/Pleura: Small left pleural effusion with adjacent atelectasis. Trace right pleural effusion with adjacent atelectasis. Small ill-defined ground-glass opacities  within the upper lobes bilaterally, likely mild edema. Pulmonary nodule within the medial aspects of the right upper lobe measures 1.3 x 1.1 cm (series 6, image 36). Additional nodule within the lingula measures 6 mm (series 6, image 41). Upper abdomen: Small layering stones within the otherwise normal-appearing gallbladder. Small low-density collection adjacent to the lateral margin of the stomach cardia, as described on the earlier abdomen CT. Musculoskeletal: No acute or suspicious osseous finding. Mild degenerative spurring within the thoracic spine. Mild compression deformity of an upper thoracic vertebral body appears stable. Superficial soft tissues are unremarkable. Review of the MIP images confirms the above findings. IMPRESSION: 1. Moderate to large amount of pulmonary emboli bilaterally. This includes occlusive pulmonary embolus within the lingular pulmonary artery branch and nearly occlusive pulmonary emboli within the distal aspects of each main pulmonary artery extending into multiple segmental lower lobe pulmonary artery branches bilaterally. There is CT evidence of right heart strain (with RV/LV Ratio greater than 1) consistent with at least submassive (intermediate risk) PE. The presence of right heart strain has been associated with an increased risk of morbidity and mortality. Please activate Code PE by paging [phone]. 2. Small left pleural effusion and trace right pleural effusion. 3. Pulmonary nodule within the right upper lobe measuring 1.3 x 1.1 cm. This is new compared to an older chest CT of 04/06/2009. Neoplastic pulmonary nodule is not excluded. Consider further characterization with PET-CT after current issues are resolved. 4. Circumscribed fluid density collection within the lower mediastinum, adjacent to the anterior margin of the distal esophagus, measuring 2.8 x 1.7 cm, better demonstrated on the CT abdomen performed earlier today. On the earlier CT abdomen, this had an  appearance suspicious for early developing abscess. Additional possible abscess versus expected postoperative fluid collection was also  appreciated adjacent to the stomach cardia. 5. Aortic atherosclerosis. These results were called by telephone at the time of interpretation on 04/23/2016 at 3:24 p.m. to Dr. Denton [name], who verbally acknowledged these results. Electronically Signed   By: Franki Cabot M.D.   On: 04/23/2016 15:31   Ct Abdomen Pelvis W Contrast  Result Date: 04/23/2016 CLINICAL DATA:  Discharge from hospital on July 28th after abdominal surgery. Now with sharp pains at surgical site. Hematemesis last night. Status post recent laparotomy with repair of gastric perforation and placement of gastrostomy tube. EXAM: CT ABDOMEN AND PELVIS WITH CONTRAST TECHNIQUE: Multidetector CT imaging of the abdomen and pelvis was performed using the standard protocol following bolus administration of intravenous contrast. CONTRAST:  146mL ISOVUE-300 IOPAMIDOL (ISOVUE-300) INJECTION 61% COMPARISON:  CT abdomen status 04/04/2016 09/11/2015. FINDINGS: Lower chest: Small left pleural effusion and mild bibasilar atelectasis. Multiple acute appearing pulmonary emboli are seen within the segmental pulmonary artery branches to the right lower lobe. Hepatobiliary: Small layering stones within the otherwise normal-appearing gallbladder. Liver appears normal. Pancreas: Partially infiltrated with fat but otherwise unremarkable. Spleen: Within normal limits in size and appearance. Adrenals/Urinary Tract: Adrenal glands appear normal. Kidneys appear normal without mass, stone or hydronephrosis. No ureteral or bladder calculi identified. Bladder appears normal. Stomach/Bowel: Small hiatal hernia, significantly improved in appearance compared to the recent CT of 04/04/2016. There is a circumscribed fluid collection within the lower mediastinum, just to the left of the anterior margin of the lower esophagus, measuring 2.7 x 1.6 cm,  suspicious for early developing abscess. Additional smaller less well circumscribed low-density collection is seen adjacent to the stomach cardia, measuring approximately 2.6 x 1.5 cm, possible additional early abscess formation. No dilated large or small bowel loops. Gastrostomy tube appears well positioned. Scattered diverticulosis within the descending and sigmoid colon without evidence of acute diverticulitis. Vascular/Lymphatic: Scattered atherosclerotic changes of the normal caliber abdominal aorta. No enlarged lymph nodes seen. Reproductive: No mass or other significant abnormality. Other: No free intraperitoneal air. Musculoskeletal: Degenerative changes throughout the slightly scoliotic thoracolumbar spine. No acute or suspicious osseous finding. IMPRESSION: 1. Interval surgical changes of the stomach, compatible with the given history of recent repair of gastric perforation and placement of gastrostomy tube. Circumscribed fluid density collection within the lower mediastinum, chest to the left of the anterior margin of the lower esophagus, measuring 2.7 x 1.6 cm, possibly expected postoperative fluid collection but suspicious for early developing abscess collection. Associated perforation cannot be excluded, but no extraluminal air to confirm. 2. Poorly defined low-density collection adjacent to the stomach cardia, measuring approximately 2.6 x 1.5 cm, expected postsurgical fluid versus additional early developing abscess. 3. Acute appearing pulmonary emboli within segmental pulmonary artery branches to the right lower lobe. Would consider CT pulmonary angiogram for more complete characterization of the pulmonary arteries. 4. Gastrostomy tube appears well positioned. 5. No bowel obstruction.  No free intraperitoneal air. 6. Colonic diverticulosis without evidence of acute diverticulitis. 7. Cholelithiasis without evidence of acute cholecystitis. 8. Aortic atherosclerosis. 9. Small left pleural effusion.  Critical Value/emergent results were called by telephone at the time of interpretation on 04/23/2016 at 12:22 pm to Dr. Johnney Killian, who verbally acknowledged these results. Electronically Signed   By: Franki Cabot M.D.   On: 04/23/2016 12:24   Ct Abdomen Pelvis W Contrast  Result Date: 04/04/2016 CLINICAL DATA:  Abdominal and back pain EXAM: CT ABDOMEN AND PELVIS WITH CONTRAST TECHNIQUE: Multidetector CT imaging of the abdomen and pelvis was performed using the standard protocol following  bolus administration of intravenous contrast. CONTRAST:  154mL ISOVUE-300 IOPAMIDOL (ISOVUE-300) INJECTION 61% COMPARISON:  09/11/2015 FINDINGS: Lower chest and abdominal wall: There is a chronic large hiatal hernia with intrathoracic stomach. The GE junction is above the hiatus with chronic organoaxial volvulus. There is extraluminal gas, fat inflammation, and fluid around the stomach (which is nearly entirely visualized on the delayed phase). The stomach is not overdistended to suggest obstruction. No necrotic segment of bowel or pneumatosis is seen. Gastric vessels normally opacify within the hernia sac. Umbilical hernia containing nonobstructed bowel. Hepatobiliary: No focal liver abnormality.Cholelithiasis. No evidence of biliary obstruction or inflammation. Pancreas: Unremarkable. Spleen: Unremarkable. Adrenals/Urinary Tract: Negative adrenals. No hydronephrosis or stone. Unremarkable bladder. Stomach/Bowel: Bowel findings described above. Colonic diverticulosis. Reproductive:Fiducial markers in the central right aspect of the prostate gland which has a stable CT appearance. No pelvic adenopathy. Vascular/Lymphatic: Extensive atherosclerotic calcification. No acute vascular abnormality. No mass or adenopathy. Musculoskeletal: Advanced lumbar facet arthropathy with L4-5 and L5-S1 anterolisthesis. Multilevel disc degeneration and spondylosis. Critical Value/emergent results were called by telephone at the time of  interpretation on 04/04/2016 at 10:03 pm to Dr. Virgel Manifold , who verbally acknowledged these results. IMPRESSION: 1. Bowel perforation, presumably gastric, with extraluminal gas, fluid, and inflammation in a hiatal hernia sac. There is chronic intrathoracic stomach herniation with organoaxial volvulus. Despite the volvulus, the stomach is not obstructed or distended. 2. Bowel containing umbilical hernia without related obstruction. 3. Cholelithiasis. Electronically Signed   By: Monte Fantasia M.D.   On: 04/04/2016 22:06   Dg Chest Port 1 View  Result Date: 04/23/2016 CLINICAL DATA:  82 year old male with cough and hypotension EXAM: PORTABLE CHEST 1 VIEW COMPARISON:  Chest radiograph dated 04/08/2016 FINDINGS: Single portable view of chest demonstrates emphysematous changes of the lungs. There is mild eventration of the left hemidiaphragm. No focal consolidation, pleural effusion, or pneumothorax. Stable cardiac silhouette. Osteopenia with degenerative changes of the shoulders. No acute osseous pathology. IMPRESSION: No active disease. Electronically Signed   By: Anner Crete M.D.   On: 04/23/2016 04:56   Dg Chest Port 1 View  Result Date: 04/08/2016 CLINICAL DATA:  Increased wheezing and shortness of breath EXAM: PORTABLE CHEST 1 VIEW COMPARISON:  04/06/2016 FINDINGS: Chronic cardiopericardial enlargement. Stable positioning of central line with tip over the SVC. Improved lung volumes despite interval extubation. Persistent bibasilar opacity that is likely atelectasis. The left diaphragm is now visible. No effusion or pneumothorax. IMPRESSION: Improved lung volumes and decreased atelectasis after extubation. Electronically Signed   By: Monte Fantasia M.D.   On: 04/08/2016 05:52   Dg Chest Port 1 View  Result Date: 04/06/2016 CLINICAL DATA:  Intubation. EXAM: PORTABLE CHEST 1 VIEW COMPARISON:  04/05/2016. FINDINGS: Endotracheal tube 1.8 cm above the carina. Proximal repositioning of approximately 1  -2 cm should be considered. Right IJ line stable position. Heart size stable. Low lung volumes with persist bibasilar atelectasis and/or infiltrates. Small bilateral pleural effusions cannot be excluded. No pneumothorax. IMPRESSION: 1. Endotracheal tube 1.8 cm above the carina. Proximal repositioning of approximately 1-2 cm should be considered . Right IJ line in stable position. 2. Low lung volumes with persistent bibasilar atelectasis and/or infiltrates. Small bilateral pleural effusions cannot be excluded . Electronically Signed   By: Marcello Moores  Register   On: 04/06/2016 06:46   Dg Chest Port 1 View  Result Date: 04/05/2016 CLINICAL DATA:  Postop.  Endotracheal tube. EXAM: PORTABLE CHEST 1 VIEW COMPARISON:  12/31/2015 FINDINGS: Low volume chest with streaky opacity greater on the left.  Interstitial coarsening. Stable heart size. Central line on the right with tip at the SVC level. No pneumothorax. IMPRESSION: 1. Endotracheal tube and right IJ line in good position. No pneumothorax. 2. Postoperative atelectasis and pulmonary venous congestion. Electronically Signed   By: Monte Fantasia M.D.   On: 04/05/2016 03:26   Dg Swallowing Func-speech Pathology  Result Date: 04/13/2016 Objective Swallowing Evaluation: Type of Study: MBS-Modified Barium Swallow Study Patient Details Name: FRENCHIE KLUSMAN MRN: [id number] Date of Birth: 02-24-1931 Today's Date: 04/13/2016 Time: SLP Start Time (ACUTE ONLY): 1430-SLP Stop Time (ACUTE ONLY): 1500 SLP Time Calculation (min) (ACUTE ONLY): 30 min Past Medical History: Past Medical History: Diagnosis Date . Anemia  . Aortic insufficiency 08/11/2015 . Basal cell cancer  . Brain tumor ([place])  . GERD (gastroesophageal reflux disease)  . Herniated disc   lumbar spine . Hiatal hernia  . History of radiation therapy   back approximately 2010 prostate radiation under care of Dr. Valere Dross  . Hyperlipidemia  . Hypertension  . Meningioma ([place])   brain . Nocturia  . Occult blood in stools  . OSA  (obstructive sleep apnea) 10/28/2014  Moderate with AHI 21/hr . Prostate cancer ([place])  . Restless leg  . Seizures ([place])  Past Surgical History: Past Surgical History: Procedure Laterality Date . APPLICATION OF CRANIAL NAVIGATION N/A [id number]  Procedure: APPLICATION OF CRANIAL NAVIGATION;  Surgeon: Consuella Lose, MD;  Location: [place] [facility];  Service: Neurosurgery;  Laterality: N/A;  APPLICATION OF CRANIAL NAVIGATION . Bilateral cateract surgery   . COLONOSCOPY    colon polyp removed . CRANIOTOMY Right 09/27/2013  Procedure: RIGHT FRONTAL CRANIOTOMY FOR TUMOR RESECTION ;  Surgeon: Consuella Lose, MD;  Location: [place] [facility];  Service: Neurosurgery;  Laterality: Right; . CRANIOTOMY Right 02/17/2015  Procedure: Right frontal parietal craniotomy for resection of meningioma with brainlab;  Surgeon: Consuella Lose, MD;  Location: [facility];  Service: Neurosurgery;  Laterality: Right;  Right frontal parietal craniotomy for resection of meningioma with brainlab . JOINT REPLACEMENT    right knee replacement . LAPAROTOMY N/A 04/04/2016  Procedure: EXPLORATORY LAPAROTOMY, REPAIR OF GASTRIC PERFORATION WITH OMENTAL PATCH, PLACEMENT OF GASTROSTOMY TUBE;  Surgeon: Autumn Messing III, MD;  Location: [facility];  Service: General;  Laterality: N/A; . PROSTATE BIOPSY   . TONSILLECTOMY    Agae 6-7 . VASECTOMY   HPI: 82 yo male adm to [facility] with abdominal pain - pt is s/p surgery with PEG placed 7/17 with intubation a few days.  Per wife, pt has been in ICU and has not had po until yesterday.   Pt started po intake yesteday and was coughing with liquids, therefore diet was changed to nectar liquids and SLP ordered.   Diet was advanced to regular/thin today.  Pt PMH + for meningioma s/p XRT and surgery, anemia, seizures.  Pt CXR shows atelectasis.   Subjective: pt seen in radiology for MBS Assessment / Plan / Recommendation CHL IP CLINICAL IMPRESSIONS 04/13/2016 Therapy Diagnosis Mild oral phase dysphagia;Mild pharyngeal phase dysphagia  Clinical Impression Pt presents with mild oropharyngeal dysphagia, characterized by poor bolus formation of puree and solid with posterior spillage to the vallecula, and right anterior leakage of liquids. Swallow reflex was delayed, with trigger at the vallecula on the initial swallow. Poor airway closure resulted in flash penetration of thin liquids during the initial swallow. Nectar thick liquid prevented penetration of the initial swallow, however, Residue of thin and nectar thick liquid on tongue base and vallecula was noted to spill to  the pyriform prior to trigger of (cued) dry swallow.  Small boluses and immediate second swallow prevented penetration of nectar thick liquids. Recommend dys 3 solids with chopped meats and nectar thick liquids, SMALL bites and sips at a SLOW rate, with 2 swallows per bite/sip. Meds one at a time with puree. RN was notified of results and recommendations, and safe swallow precautions were posted at [facility]. ST will follow for education and assessment of diet tolerance. Recommend repeat MBS prior to advancing liquids, given lack of reflexive response to penetration on this study. Impact on safety and function Risk for inadequate nutrition/hydration;Moderate aspiration risk;Mild aspiration risk   CHL IP TREATMENT RECOMMENDATION 04/13/2016 Treatment Recommendations Therapy as outlined in treatment plan below   Prognosis 04/13/2016 Prognosis for Safe Diet Advancement Guarded Barriers to Reach Goals Motivation CHL IP DIET RECOMMENDATION 04/13/2016 SLP Diet Recommendations Dysphagia 3 (Mech soft) solids;Nectar thick liquid Liquid Administration via No straw;Cup Medication Administration Whole meds with puree Compensations Slow rate;Small sips/bites;Clear throat intermittently;Other (Comment);Minimize environmental distractions;Multiple dry swallows after each bite/sip Postural Changes Seated upright at 90 degrees   CHL IP OTHER RECOMMENDATIONS 04/13/2016 Recommended Consults -- Oral Care  Recommendations Oral care BID Other Recommendations Order thickener from pharmacy;Prohibited food (jello, ice cream, thin soups);Remove water pitcher   CHL IP FOLLOW UP RECOMMENDATIONS 04/12/2016 Follow up Recommendations To be determined   CHL IP FREQUENCY AND DURATION 04/13/2016 Speech Therapy Frequency (ACUTE ONLY) min 2x/week Treatment Duration 1 week    CHL IP ORAL PHASE 04/13/2016 Oral Phase Impaired Oral - Nectar Cup Right anterior bolus loss Oral - Thin Cup Right anterior bolus loss Oral - Puree Reduced posterior propulsion;Delayed oral transit;Premature spillage Oral - Multi-Consistency Premature spillage;Delayed oral transit;Reduced posterior propulsion  CHL IP PHARYNGEAL PHASE 04/13/2016 Pharyngeal Phase Impaired Pharyngeal- Nectar Cup Delayed swallow initiation-vallecula;Reduced pharyngeal peristalsis;Reduced anterior laryngeal mobility;Reduced laryngeal elevation;Reduced airway/laryngeal closure;Reduced tongue base retraction;Penetration/Aspiration during swallow;Pharyngeal residue - valleculae;Delayed swallow initiation-pyriform sinuses Pharyngeal Material enters airway, remains ABOVE vocal cords then ejected out Pharyngeal- Thin Cup Delayed swallow initiation-vallecula;Delayed swallow initiation-pyriform sinuses;Reduced pharyngeal peristalsis;Reduced anterior laryngeal mobility;Reduced laryngeal elevation;Reduced airway/laryngeal closure;Pharyngeal residue - valleculae;Reduced tongue base retraction;Penetration/Aspiration during swallow Pharyngeal Material enters airway, remains ABOVE vocal cords then ejected out Pharyngeal- Puree Delayed swallow initiation-vallecula;Reduced tongue base retraction Pharyngeal- Multi-consistency Delayed swallow initiation-vallecula;Reduced tongue base retraction  CHL IP CERVICAL ESOPHAGEAL PHASE 04/13/2016 Cervical Esophageal Phase [facility] Celia B. [place], [facility], CCC-SLP [id number] Shonna Chock 04/13/2016, 3:19 PM               Lab Data:  CBC:  Recent Labs Lab  04/23/16 0421 04/23/16 0445 04/23/16 0723 04/24/16 0519 04/25/16 0410 04/26/16 0458  WBC 14.2*  --  13.5* 10.8* 9.0 9.1  NEUTROABS 11.6*  --   --   --   --   --   HGB 13.4 14.6 13.5 11.1* 10.7* 11.1*  HCT 43.6 43.0 43.2 36.7* 35.2* 36.3*  MCV 91.6  --  90.6 92.0 91.0 90.8  PLT 277  --  239 283 244 [id number]   Basic Metabolic Panel:  Recent Labs Lab 04/23/16 0421 04/23/16 0445 04/24/16 0519 04/25/16 0410 04/26/16 0458  NA 135 136 135 135 134*  K 3.8 3.7 4.2 3.4* 3.5  CL 100* 99* 101 104 105  CO2 24  --  23 24 22   GLUCOSE 110* 106* 103* 86 91  BUN 29* 30* 15 11 7   CREATININE 1.08 1.00 0.91 0.92 0.90  CALCIUM 8.7*  --  8.2* 8.0* 8.0*   GFR: Estimated Creatinine  Clearance: 74.4 mL/min (by C-G formula based on SCr of 0.9 mg/dL). Liver Function Tests:  Recent Labs Lab 04/23/16 0421  AST 31  ALT 30  ALKPHOS 72  BILITOT 0.5  PROT 5.8*  ALBUMIN 2.4*   No results for input(s): LIPASE, AMYLASE in the last 168 hours. No results for input(s): AMMONIA in the last 168 hours. Coagulation Profile:  Recent Labs Lab 04/23/16 1337  INR 1.10   Cardiac Enzymes: No results for input(s): CKTOTAL, CKMB, CKMBINDEX, TROPONINI in the last 168 hours. BNP (last 3 results) No results for input(s): PROBNP in the last 8760 hours. HbA1C: No results for input(s): HGBA1C in the last 72 hours. CBG: No results for input(s): GLUCAP in the last 168 hours. Lipid Profile: No results for input(s): CHOL, HDL, LDLCALC, TRIG, CHOLHDL, LDLDIRECT in the last 72 hours. Thyroid Function Tests: No results for input(s): TSH, T4TOTAL, FREET4, T3FREE, THYROIDAB in the last 72 hours. Anemia Panel: No results for input(s): VITAMINB12, FOLATE, FERRITIN, TIBC, IRON, RETICCTPCT in the last 72 hours. Urine analysis:    Component Value Date/Time   COLORURINE YELLOW 04/23/2016 1221   APPEARANCEUR TURBID (A) 04/23/2016 1221   LABSPEC 1.045 (H) 04/23/2016 1221   PHURINE 7.0 04/23/2016 1221   GLUCOSEU NEGATIVE  04/23/2016 1221   HGBUR NEGATIVE 04/23/2016 1221   BILIRUBINUR NEGATIVE 04/23/2016 1221   KETONESUR NEGATIVE 04/23/2016 1221   PROTEINUR 30 (A) 04/23/2016 1221   UROBILINOGEN 0.2 07/31/2014 1941   NITRITE NEGATIVE 04/23/2016 1221   LEUKOCYTESUR NEGATIVE 04/23/2016 1221     Kayshawn Ozburn M.D. [facility] 04/26/2016, 1:05 PM  Pager: [phone] Between 7am to 7pm - call Pager - [phone]  After 7pm go to [email or web address] - password [email or web address]  Call night coverage person covering after 7pm

## 2016-04-26 NOTE — Progress Notes (Signed)
[place] for heparin Indication: pulmonary embolus  Allergies  Allergen Reactions  . Asa [Aspirin] Nausea Only  . Other Itching and Rash    Nuts  . Vicodin [Hydrocodone-Acetaminophen] Nausea Only    Patient Measurements: Height: 6\' 6"  (198.1 cm) Weight: 186 lb 8 oz (84.6 kg) IBW/kg (Calculated) : 91.4 Heparin Dosing Weight: 85 kg  Vital Signs: Temp: 99 F (37.2 C) (08/08 1152) Temp Source: Oral (08/08 1152) BP: 125/69 (08/08 1152) Pulse Rate: 72 (08/08 1152)  Labs:  Recent Labs  04/24/16 0519 04/25/16 0410 04/26/16 0458 04/26/16 1423  HGB 11.1* 10.7* 11.1*  --   HCT 36.7* 35.2* 36.3*  --   PLT 283 244 245  --   HEPARINUNFRC 0.70 0.64 0.85* 0.73*  CREATININE 0.91 0.92 0.90  --     Estimated Creatinine Clearance: 74.4 mL/min (by C-G formula based on SCr of 0.9 mg/dL).  Assessment: 82 y.o. male with R lower lobe PE identified on CT, pharmacy consulted for heparin dosing.  HL supratherapeutic again this afternoon at 0.73, will decrease rate and recheck level tonight. Hgb stabilizing (had G tube replaced 8/5-now removed), Pltc stable WNL, no s/sx bleeding noted. Plan is to continue with heparin for now with plans for transition to [place] soon.  Goal of Therapy:  Heparin level 0.3-0.7 units/ml Monitor platelets by anticoagulation protocol: Yes   Plan:  Decrease Heparin IV to 1000 units/hr Check anti-Xa level in 8 hours and daily while on heparin Continue to monitor H&H and platelets   Thank you for allowing [place] to participate in this patients care. Jens Som, PharmD Pager: [phone] 8/8/20174:02 PM

## 2016-04-26 NOTE — Progress Notes (Signed)
[place] for heparin Indication: pulmonary embolus  Allergies  Allergen Reactions  . Asa [Aspirin] Nausea Only  . Other Itching and Rash    Nuts  . Vicodin [Hydrocodone-Acetaminophen] Nausea Only    Patient Measurements: Height: 6\' 6"  (198.1 cm) Weight: 186 lb 8 oz (84.6 kg) IBW/kg (Calculated) : 91.4 Heparin Dosing Weight: 85 kg  Vital Signs: Temp: 98.4 F (36.9 C) (08/08 0419) Temp Source: Oral (08/08 0419) BP: 140/75 (08/08 0419) Pulse Rate: 70 (08/08 0419)  Labs:  Recent Labs  04/23/16 1337  04/24/16 0519 04/25/16 0410 04/26/16 0458  HGB  --   --  11.1* 10.7* 11.1*  HCT  --   --  36.7* 35.2* 36.3*  PLT  --   --  283 244 245  LABPROT 14.3  --   --   --   --   INR 1.10  --   --   --   --   HEPARINUNFRC  --   < > 0.70 0.64 0.85*  CREATININE  --   --  0.91 0.92 0.90  < > = values in this interval not displayed.  Estimated Creatinine Clearance: 74.4 mL/min (by C-G formula based on SCr of 0.9 mg/dL).  Assessment: 82 y.o. male with PE for heparin  Goal of Therapy:  Heparin level 0.3-0.7 units/ml Monitor platelets by anticoagulation protocol: Yes   Plan:  Decrease Heparin 1100 units/hr Check heparin level in 6 hours.   Phillis Knack, PharmD, BCPS  04/26/2016 6:06 AM

## 2016-04-26 NOTE — Progress Notes (Signed)
Pt exhibiting some left arm weakness. Unable to hold onto fork. Equal grips, no drift. No pain. Dr. Tana Coast made aware. CT head ordered.

## 2016-04-26 NOTE — Progress Notes (Signed)
Physical Therapy Treatment Patient Details Name: [name] MRN: [id number] DOB: [birth year] Today's Date: 04/26/2016    History of Present Illness 82 yo male admitted 04/04/16 with abdominal pain. S/P EXPLORATORY LAPAROTOMY, REPAIR OF GASTRIC PERFORATION WITH OMENTAL PATCH, PLACEMENT OF GASTROSTOMY TUBE on 04/05/18    PT Comments    Patient seen for activity progression. Patient tolerated exercise in supine as well as sitting EOB. Performed some part task pre gait transfer training with increased assist. At this time, continues to required +2 assist for OOB mobility. Will continue to see and progress as tolerated.   Follow Up Recommendations  SNF     Equipment Recommendations  None recommended by PT    Recommendations for Other Services       Precautions / Restrictions Precautions Precautions: Fall Precaution Comments: multiple lines and leg weakness. Restrictions Weight Bearing Restrictions: No    Mobility  Bed Mobility Overal bed mobility: Needs Assistance Bed Mobility: Rolling;Sidelying to Sit;Sit to Supine Rolling: Mod assist Sidelying to sit: Mod assist     Sit to sidelying: Mod assist General bed mobility comments: Moderate assist for bed mobility. VCs for positioning and hand placement, patient able to bring LEs to EOB and reach across with assist for rail coming to the left side of the bed using RUE. Assist to power up trunk to sitting EOB. Moderate assist for LE elevation and positioning upon return to supine. Increased time and effort  Transfers Overall transfer level: Needs assistance Equipment used:  (positioning chair back in from for UE support) Transfers: Sit to/from Stand Sit to Stand: Max assist;From elevated surface Stand pivot transfers: Max assist;+2 physical assistance       General transfer comment: Performed from elevated surface x5 for part task transfer training, unable to reach full upright position but did clear buttocks x5 with increased  assist  Ambulation/Gait                 Stairs            Wheelchair Mobility    Modified Rankin (Stroke Patients Only)       Balance Overall balance assessment: Needs assistance Sitting-balance support: Feet supported Sitting balance-Leahy Scale: Poor Sitting balance - Comments: intermittent need for assist, assist during dynamic EOb therapeutic exercises Postural control: Posterior lean Standing balance support: During functional activity Standing balance-Leahy Scale: Zero                      Cognition Arousal/Alertness: Awake/alert Behavior During Therapy: WFL for tasks assessed/performed Overall Cognitive Status: Within Functional Limits for tasks assessed                      Exercises General Exercises - Lower Extremity Ankle Circles/Pumps: AROM;Both;10 reps;Seated Short Arc Quad: AROM;Both;5 reps;Supine Long Arc Quad: AROM;Both;10 reps;Seated Hip Flexion/Marching: AROM;5 reps;Seated Other Exercises Other Exercises: performed part task sit <>stand with bed height elevated to highest point, performed x5 with some clearance but unable to reach full upright position each time    General Comments        Pertinent Vitals/Pain Pain Assessment: No/denies pain    Home Living                      Prior Function            PT Goals (current goals can now be found in the care plan section) Acute Rehab PT Goals Patient Stated Goal: to get stronger  PT Goal Formulation: With patient Time For Goal Achievement: 05/08/16 Potential to Achieve Goals: Good Progress towards PT goals: Progressing toward goals    Frequency  Min 2X/week    PT Plan Current plan remains appropriate    Co-evaluation             End of Session Equipment Utilized During Treatment: Gait belt Activity Tolerance: Patient limited by fatigue Patient left: in bed;with call bell/phone within reach;bed alarm set     Time: [id number] PT Time  Calculation (min) (ACUTE ONLY): 27 min  Charges:  $Therapeutic Exercise: 8-22 mins $Therapeutic Activity: 8-22 mins                    G CodesDuncan Dull 24-May-2016, 5:31 PM Alben Deeds, [place] DPT  [phone]

## 2016-04-26 NOTE — Progress Notes (Signed)
Subjective: Events noted. No longer c/o of weak RUE.  CT head negative for acute stroke. g tube removed yesterday. Has had significantly less drainage  Objective: Vital signs in last 24 hours: Temp:  [97.9 F (36.6 C)-99 F (37.2 C)] 99 F (37.2 C) (08/08 1152) Pulse Rate:  [70-79] 72 (08/08 1152) Resp:  [18] 18 (08/08 1152) BP: (125-153)/(69-85) 125/69 (08/08 1152) SpO2:  [96 %-97 %] 97 % (08/08 1152) Weight:  [84.6 kg (186 lb 8 oz)] 84.6 kg (186 lb 8 oz) (08/08 0419) Last BM Date: 04/25/16  Intake/Output from previous day: 08/07 0701 - 08/08 0700 In: 4816.9 [P.O.:600; I.V.:4016.9; IV Piggyback:200] Out: [id number] [Urine:1675] Intake/Output this shift: Total I/O In: 259.8 [P.O.:240; I.V.:19.8] Out: -   Alert, nontoxic Soft, obese, nt, wound vac intact. Old g tube site - dressing c/d/i; improving skin irritation  Lab Results:   Recent Labs  04/25/16 0410 04/26/16 0458  WBC 9.0 9.1  HGB 10.7* 11.1*  HCT 35.2* 36.3*  PLT 244 245   BMET  Recent Labs  04/25/16 0410 04/26/16 0458  NA 135 134*  K 3.4* 3.5  CL 104 105  CO2 24 22  GLUCOSE 86 91  BUN 11 7  CREATININE 0.92 0.90  CALCIUM 8.0* 8.0*   PT/INR  Recent Labs  04/23/16 1337  LABPROT 14.3  INR 1.10   ABG No results for input(s): PHART, HCO3 in the last 72 hours.  Invalid input(s): PCO2, PO2  Studies/Results: Ct Head Wo Contrast  Result Date: 04/26/2016 CLINICAL DATA:  Left arm weakness noticed this morning while eating breakfast. EXAM: CT HEAD WITHOUT CONTRAST TECHNIQUE: Contiguous axial images were obtained from the base of the skull through the vertex without intravenous contrast. COMPARISON:  03/21/2016 brain MRI FINDINGS: Brain: There is known extra axial mass deep to the high right bone flap consistent with meningioma. Multiple areas of marginal nodular disease are poorly seen compared to previous MRI. There is known invasion of the superior sagittal sinus. White matter low-density without  notable mass effect in the posterior right frontal, right parietal, and right occipital regions is stable from previous. No superimposed infarct, hemorrhage, or hydrocephalus. Left posterior subdural collection on the previous study is not visualized today. Vascular: Atherosclerotic calcification. Meningioma is known to invade and/or occlude the superior sagittal sinus. Skull: Stable appearance of 2 high right craniotomy sites Sinuses/Orbits: Bilateral cataract resection.  No acute finding. Other: None. IMPRESSION: 1. No acute finding when compared to 03/21/2016 MRI. 2. Multi focal meningioma along the posterior right cerebral convexity with stable vasogenic edema and/or white matter gliosis. Electronically Signed   By: Monte Fantasia M.D.   On: 04/26/2016 10:44    Anti-infectives: Anti-infectives    Start     Dose/Rate Route Frequency Ordered Stop   04/24/16 2230  fluconazole (DIFLUCAN) IVPB 200 mg     200 mg 100 mL/hr over 60 Minutes Intravenous  Once 04/24/16 2143 04/25/16 0217   04/23/16 1400  piperacillin-tazobactam (ZOSYN) IVPB 3.375 g    Comments:  Zosyn 3.375 g IV q8h for CrCl > 20 mL/min   3.375 g 12.5 mL/hr over 240 Minutes Intravenous Every 8 hours 04/23/16 0605     04/23/16 0530  piperacillin-tazobactam (ZOSYN) IVPB 3.375 g     3.375 g 12.5 mL/hr over 240 Minutes Intravenous  Once 04/23/16 0520 04/23/16 1002   04/23/16 0400  piperacillin-tazobactam (ZOSYN) IVPB 3.375 g  Status:  Discontinued     3.375 g 100 mL/hr over 30 Minutes Intravenous  Once  04/23/16 0354 04/23/16 0355      Assessment/Plan: s/p HH repair with placement of G tube, now admitted for PE on heparin drip -continue anticoagulation per medicine; can transition to oral since appears G tube drainage is decreasing  Leaking G tube -appears to be taking adequate PO. Will adv diet to dys 3, nectar thick based on speech note from 7/27 -removed at [facility] on 8/7. -drainage significantly improved.  -cont wound care  consult.for abd skin  Paraesophageal fluid collection -no leukocytosis, no pain with swallowing both go against a developing abscess. Abscess formation would be uncommon with this surgery -recommend empiric abx for 1 week and observation  Leighton Ruff. Redmond Pulling, MD, FACS General, Bariatric, & Minimally Invasive Surgery [facility], [place]   LOS: 3 days    Gayland Curry 04/26/2016

## 2016-04-27 DIAGNOSIS — R1114 Bilious vomiting: Secondary | ICD-10-CM

## 2016-04-27 LAB — CBC
HEMATOCRIT: 34.5 % — AB (ref 39.0–52.0)
Hemoglobin: 10.6 g/dL — ABNORMAL LOW (ref 13.0–17.0)
MCH: 28.1 pg (ref 26.0–34.0)
MCHC: 30.7 g/dL (ref 30.0–36.0)
MCV: 91.5 fL (ref 78.0–100.0)
Platelets: 228 10*3/uL (ref 150–400)
RBC: 3.77 MIL/uL — AB (ref 4.22–5.81)
RDW: 18.4 % — ABNORMAL HIGH (ref 11.5–15.5)
WBC: 8.8 10*3/uL (ref 4.0–10.5)

## 2016-04-27 LAB — BASIC METABOLIC PANEL
ANION GAP: 7 (ref 5–15)
BUN: 6 mg/dL (ref 6–20)
CO2: 22 mmol/L (ref 22–32)
Calcium: 7.9 mg/dL — ABNORMAL LOW (ref 8.9–10.3)
Chloride: 104 mmol/L (ref 101–111)
Creatinine, Ser: 0.91 mg/dL (ref 0.61–1.24)
GFR calc Af Amer: >60 mL/min (ref >60)
GFR calc non Af Amer: >60 mL/min (ref >60)
GLUCOSE: 82 mg/dL (ref 65–99)
POTASSIUM: 3.5 mmol/L (ref 3.5–5.1)
Sodium: 133 mmol/L — ABNORMAL LOW (ref 135–145)

## 2016-04-27 LAB — HEPARIN LEVEL (UNFRACTIONATED): Heparin Unfractionated: 0.48 IU/mL (ref 0.30–0.70)

## 2016-04-27 MED ORDER — RIVAROXABAN (XARELTO) EDUCATION KIT FOR DVT/PE PATIENTS
PACK | Freq: Once | Status: DC
  Filled 2016-04-27 (×2): qty 1

## 2016-04-27 MED ORDER — AMOXICILLIN-POT CLAVULANATE 875-125 MG PO TABS
1.0000 | ORAL_TABLET | Freq: Two times a day (BID) | ORAL | Status: DC
  Administered 2016-04-27 – 2016-04-30 (×7): 1 via ORAL
  Filled 2016-04-27 (×7): qty 1

## 2016-04-27 MED ORDER — RIVAROXABAN 20 MG PO TABS: 20.0000 mg | ORAL_TABLET | Freq: Every day | ORAL | Status: DC

## 2016-04-27 MED ORDER — RIVAROXABAN 15 MG PO TABS
15.0000 mg | ORAL_TABLET | Freq: Two times a day (BID) | ORAL | Status: DC
  Administered 2016-04-27 – 2016-04-30 (×6): 15 mg via ORAL
  Filled 2016-04-27 (×6): qty 1

## 2016-04-27 NOTE — Clinical Social Work Note (Signed)
Received call back from patient's wife. She confirms plan for return to [facility] at time of discharge. CSW will assist with DC.   Liz Beach MSW, [place], [place], [phone]

## 2016-04-27 NOTE — Progress Notes (Addendum)
[facility]                                                                              Patient Demographics  [name], is a 82 y.o. male, DOB - 1930-11-26, [id number]  Admit date - 04/23/2016   Admitting Physician Courage Denton Brick, MD  Outpatient Primary MD for the patient is  Melinda Crutch, MD  Outpatient specialists:   LOS - 4  days    Chief Complaint  Patient presents with  . Hypotension  . Emesis       Brief summary   [name] is a 82 y.o. male with medical history significant for seizures, prostate, hypertension, hyperlipidemia, GERD, aortic insufficiency, prostate cancer status post radiation therapy 2010, perforated gastric ulcer status post surgical repair 1 week ago presents to the emergency Department chief complaint of emesis generalized weakness hypotension. Initial evaluation reveals elevated lactic acid leukocytosis CT of the abdomen concerning for early abscess and/or pulmonary emboli. He states he was doing well at [facility] place after his surgery to weeks  On the day of admission, he had generalized weakness and dizziness and has had difficulty bearing weight but is undergoing physical therapy for that. He also reported persistent productive cough since surgery. On the day of admission, he reported coughing up blood-tinged sputum, brown emesis times one episode. He does have a G-tube which he says has been leaking since surgery. EMS was called to the facility for hypotension. E  Patient was found to have bilateral pulmonary embolism with DVT and right heart strain. General surgery is following closely for the leaking G-tube and recent perforated gastric ulcer repair.   Assessment & Plan   Nausea and vomiting, recent perforated gastric ulcer status post repair on 7/17, para esophageal fluid collection - Currently stable, general surgery following, no nausea or vomiting, patient tolerating clears - Per surgery, continue empiric empiric antibiotics  for 1 week - change to Oral Augmentin  Leaking G-tube - General surgery following, G-tube removed 8/7 at bedside by Dr.Wilson   - Wound VAC placed at the site, wound care following -  Ok to transition to oral anticoagulation per [facility]  Bilateral PE with right heart strain and DVT - On IV heparin, d/w with wife regarding DOACs she is agreeable to this, start Xarelto per pharmacy  Hypertension.  -BP currently stable, continue to hold Avapro   History of seizures/epileptic syndrome, meningioma: No recent seizures. Medications include Keppra and lamictal. Baseline is walking with a walker since surgery unable to ambulate -Wife states he's had 2 surgery and radiation in the past. -Continue home meds   Anemia. Chart review indicates history of iron deficiency anemia. Hemoglobin 13.5 on admission. Provided with IV iron in the past. No signs symptoms of active bleeding -Hemoglobin stable -Monitor  Steroid use. He was placed on steroid taper by his PCP last month after a fall. He got down to 0.5 daily. This was increased since secondary to recent hospitalization. -Decadron 2 mg by mouth until August 11  Pulm nodule -needs FU  Code Status: FUll code DVT Prophylaxis: xarelto Family Communication: none at bedside, called and d/w wife  Disposition Plan: SNF in 1-2days  Time Spent in minutes 25 minutes  Procedures:  CT angiogram chest CT abdomen  Consultants:   General surgery  Antimicrobials:   IV Zosyn 8/5   Medications  Scheduled Meds: . amoxicillin-clavulanate  1 tablet Oral Q12H  . benzonatate  100 mg Oral TID  . dexamethasone  2 mg Oral Daily  . famotidine  20 mg Oral Q12H  . lamoTRIgine  100 mg Oral BID  . levETIRAcetam  750 mg Oral BID  . nystatin cream   Topical BID  . pantoprazole  40 mg Oral Q0600  . polyethylene glycol  17 g Oral Daily  . rOPINIRole  1.5 mg Oral BID  . senna-docusate  1 tablet Oral BID  . tamsulosin  0.4 mg Oral QPC supper   Continuous  Infusions: . sodium chloride 100 mL/hr at 04/26/16 1800  . heparin 1,000 Units/hr (04/26/16 1553)   PRN Meds:.albuterol, bisacodyl, gi cocktail, guaiFENesin, menthol-cetylpyridinium, RESOURCE THICKENUP CLEAR   Antibiotics   Anti-infectives    Start     Dose/Rate Route Frequency Ordered Stop   04/27/16 1000  amoxicillin-clavulanate (AUGMENTIN) 875-125 MG per tablet 1 tablet     1 tablet Oral Every 12 hours 04/27/16 0947     04/24/16 2230  fluconazole (DIFLUCAN) IVPB 200 mg     200 mg 100 mL/hr over 60 Minutes Intravenous  Once 04/24/16 2143 04/25/16 0217   04/23/16 1400  piperacillin-tazobactam (ZOSYN) IVPB 3.375 g  Status:  Discontinued    Comments:  Zosyn 3.375 g IV q8h for CrCl > 20 mL/min   3.375 g 12.5 mL/hr over 240 Minutes Intravenous Every 8 hours 04/23/16 0605 04/27/16 0947   04/23/16 0530  piperacillin-tazobactam (ZOSYN) IVPB 3.375 g     3.375 g 12.5 mL/hr over 240 Minutes Intravenous  Once 04/23/16 0520 04/23/16 1002   04/23/16 0400  piperacillin-tazobactam (ZOSYN) IVPB 3.375 g  Status:  Discontinued     3.375 g 100 mL/hr over 30 Minutes Intravenous  Once 04/23/16 0354 04/23/16 0355        Subjective:   Feels well, no complaints  Objective:   Vitals:   04/27/16 0000 04/27/16 0430 04/27/16 0740 04/27/16 1100  BP: (!) 141/81 (!) 156/80    Pulse: 78 80    Resp: 16 16 18    Temp: 99.1 F (37.3 C) 97.6 F (36.4 C) 98.5 F (36.9 C) 97.8 F (36.6 C)  TempSrc: Oral Oral Oral Oral  SpO2: 95% 95% 98% 97%  Weight:  85.7 kg (189 lb)    Height:        Intake/Output Summary (Last 24 hours) at 04/27/16 1229 Last data filed at 04/27/16 0917  Gross per 24 hour  Intake           1849.2 ml  Output             1700 ml  Net            149.2 ml     Wt Readings from Last 3 Encounters:  04/27/16 85.7 kg (189 lb)  04/22/16 98 kg (216 lb)  04/20/16 98 kg (216 lb)     Exam  General: Alert and oriented x 3, NAD  HEENT:   Neck: Supple, no JVD, no  masses  Cardiovascular: S1 S2clear, RRR  Respiratory: CTAB  Gastrointestinal: Soft, nontender, wound vac in place  Ext: no cyanosis clubbing or edema  Neuro: Strength 5/5 in upper and lower extremities, no acute deficits  Skin:  Psych: Normal affect and demeanor, alert and oriented x3    Data Reviewed:  I have personally reviewed following labs and imaging studies  Micro Results Recent Results (from the past 240 hour(s))  Blood Culture (routine x 2)     Status: None (Preliminary result)   Collection Time: 04/23/16  4:21 AM  Result Value Ref Range Status   Specimen Description BLOOD RIGHT ANTECUBITAL  Final   Special Requests IN PEDIATRIC BOTTLE 4CC  Final   Culture NO GROWTH 3 DAYS  Final   Report Status PENDING  Incomplete  Blood Culture (routine x 2)     Status: None (Preliminary result)   Collection Time: 04/23/16  4:35 AM  Result Value Ref Range Status   Specimen Description BLOOD LEFT ANTECUBITAL  Final   Special Requests IN PEDIATRIC BOTTLE 4CC  Final   Culture NO GROWTH 3 DAYS  Final   Report Status PENDING  Incomplete  Urine culture     Status: Abnormal   Collection Time: 04/23/16 12:21 PM  Result Value Ref Range Status   Specimen Description URINE, RANDOM  Final   Special Requests NONE  Final   Culture MULTIPLE SPECIES PRESENT, SUGGEST RECOLLECTION (A)  Final   Report Status 04/24/2016 FINAL  Final  MRSA PCR Screening     Status: None   Collection Time: 04/23/16  4:33 PM  Result Value Ref Range Status   MRSA by PCR NEGATIVE NEGATIVE Final    Comment:        The GeneXpert MRSA Assay (FDA approved for NASAL specimens only), is one component of a comprehensive MRSA colonization surveillance program. It is not intended to diagnose MRSA infection nor to guide or monitor treatment for MRSA infections.     Radiology Reports Ct Head Wo Contrast  Result Date: 04/26/2016 CLINICAL DATA:  Left arm weakness noticed this morning while eating breakfast. EXAM:  CT HEAD WITHOUT CONTRAST TECHNIQUE: Contiguous axial images were obtained from the base of the skull through the vertex without intravenous contrast. COMPARISON:  03/21/2016 brain MRI FINDINGS: Brain: There is known extra axial mass deep to the high right bone flap consistent with meningioma. Multiple areas of marginal nodular disease are poorly seen compared to previous MRI. There is known invasion of the superior sagittal sinus. White matter low-density without notable mass effect in the posterior right frontal, right parietal, and right occipital regions is stable from previous. No superimposed infarct, hemorrhage, or hydrocephalus. Left posterior subdural collection on the previous study is not visualized today. Vascular: Atherosclerotic calcification. Meningioma is known to invade and/or occlude the superior sagittal sinus. Skull: Stable appearance of 2 high right craniotomy sites Sinuses/Orbits: Bilateral cataract resection.  No acute finding. Other: None. IMPRESSION: 1. No acute finding when compared to 03/21/2016 MRI. 2. Multi focal meningioma along the posterior right cerebral convexity with stable vasogenic edema and/or white matter gliosis. Electronically Signed   By: Monte Fantasia M.D.   On: 04/26/2016 10:44   Ct Angio Chest Pe W Or Wo Contrast  Result Date: 04/23/2016 CLINICAL DATA:  Pulmonary emboli identified in the right lower lobe on CT abdomen performed earlier today. Patient is status post recent laparotomy with gastric perforation repair and placement of gastrostomy tube. EXAM: CT ANGIOGRAPHY CHEST WITH CONTRAST TECHNIQUE: Multidetector CT imaging of the chest was performed using the standard protocol during bolus administration of intravenous contrast. Multiplanar CT image reconstructions and MIPs were obtained to evaluate the vascular anatomy. CONTRAST:  80 cc Isovue 370 COMPARISON:  CT abdomen  from earlier today. FINDINGS: Mediastinum/Lymph Nodes: Moderate to large amount of pulmonary  embolus is seen at the distal aspects of each main pulmonary artery extending into the central segmental pulmonary artery branches to each lobe bilaterally. Nearly occlusive thrombus is seen within the bilateral intralobar pulmonary artery branches extending into multiple segmental pulmonary artery branches the lower lobes bilaterally. There is occlusive pulmonary embolus within the proximal aspects of the lingular pulmonary artery branch. Thoracic aorta is normal in caliber. Scattered atherosclerotic changes noted along the walls of the thoracic aorta. Heart size is normal. No pericardial effusion. Coronary artery calcifications noted. No masses or enlarged lymph nodes seen within the mediastinum or perihilar regions. As noted on the earlier CT abdomen, there is a circumscribed-appearing collection within the lower mediastinum, adjacent to the anterior margin of the lower esophagus, measuring 2.8 x 1.7 cm, suspicious for early developing abscess. Lungs/Pleura: Small left pleural effusion with adjacent atelectasis. Trace right pleural effusion with adjacent atelectasis. Small ill-defined ground-glass opacities within the upper lobes bilaterally, likely mild edema. Pulmonary nodule within the medial aspects of the right upper lobe measures 1.3 x 1.1 cm (series 6, image 36). Additional nodule within the lingula measures 6 mm (series 6, image 41). Upper abdomen: Small layering stones within the otherwise normal-appearing gallbladder. Small low-density collection adjacent to the lateral margin of the stomach cardia, as described on the earlier abdomen CT. Musculoskeletal: No acute or suspicious osseous finding. Mild degenerative spurring within the thoracic spine. Mild compression deformity of an upper thoracic vertebral body appears stable. Superficial soft tissues are unremarkable. Review of the MIP images confirms the above findings. IMPRESSION: 1. Moderate to large amount of pulmonary emboli bilaterally. This  includes occlusive pulmonary embolus within the lingular pulmonary artery branch and nearly occlusive pulmonary emboli within the distal aspects of each main pulmonary artery extending into multiple segmental lower lobe pulmonary artery branches bilaterally. There is CT evidence of right heart strain (with RV/LV Ratio greater than 1) consistent with at least submassive (intermediate risk) PE. The presence of right heart strain has been associated with an increased risk of morbidity and mortality. Please activate Code PE by paging [phone]. 2. Small left pleural effusion and trace right pleural effusion. 3. Pulmonary nodule within the right upper lobe measuring 1.3 x 1.1 cm. This is new compared to an older chest CT of 04/06/2009. Neoplastic pulmonary nodule is not excluded. Consider further characterization with PET-CT after current issues are resolved. 4. Circumscribed fluid density collection within the lower mediastinum, adjacent to the anterior margin of the distal esophagus, measuring 2.8 x 1.7 cm, better demonstrated on the CT abdomen performed earlier today. On the earlier CT abdomen, this had an appearance suspicious for early developing abscess. Additional possible abscess versus expected postoperative fluid collection was also appreciated adjacent to the stomach cardia. 5. Aortic atherosclerosis. These results were called by telephone at the time of interpretation on 04/23/2016 at 3:24 p.m. to Dr. Denton Brick, who verbally acknowledged these results. Electronically Signed   By: Franki Cabot M.D.   On: 04/23/2016 15:31   Ct Abdomen Pelvis W Contrast  Result Date: 04/23/2016 CLINICAL DATA:  Discharge from hospital on July 28th after abdominal surgery. Now with sharp pains at surgical site. Hematemesis last night. Status post recent laparotomy with repair of gastric perforation and placement of gastrostomy tube. EXAM: CT ABDOMEN AND PELVIS WITH CONTRAST TECHNIQUE: Multidetector CT imaging of the abdomen and  pelvis was performed using the standard protocol following bolus administration of intravenous contrast. CONTRAST:  125mL ISOVUE-300 IOPAMIDOL (ISOVUE-300) INJECTION 61% COMPARISON:  CT abdomen status 04/04/2016 09/11/2015. FINDINGS: Lower chest: Small left pleural effusion and mild bibasilar atelectasis. Multiple acute appearing pulmonary emboli are seen within the segmental pulmonary artery branches to the right lower lobe. Hepatobiliary: Small layering stones within the otherwise normal-appearing gallbladder. Liver appears normal. Pancreas: Partially infiltrated with fat but otherwise unremarkable. Spleen: Within normal limits in size and appearance. Adrenals/Urinary Tract: Adrenal glands appear normal. Kidneys appear normal without mass, stone or hydronephrosis. No ureteral or bladder calculi identified. Bladder appears normal. Stomach/Bowel: Small hiatal hernia, significantly improved in appearance compared to the recent CT of 04/04/2016. There is a circumscribed fluid collection within the lower mediastinum, just to the left of the anterior margin of the lower esophagus, measuring 2.7 x 1.6 cm, suspicious for early developing abscess. Additional smaller less well circumscribed low-density collection is seen adjacent to the stomach cardia, measuring approximately 2.6 x 1.5 cm, possible additional early abscess formation. No dilated large or small bowel loops. Gastrostomy tube appears well positioned. Scattered diverticulosis within the descending and sigmoid colon without evidence of acute diverticulitis. Vascular/Lymphatic: Scattered atherosclerotic changes of the normal caliber abdominal aorta. No enlarged lymph nodes seen. Reproductive: No mass or other significant abnormality. Other: No free intraperitoneal air. Musculoskeletal: Degenerative changes throughout the slightly scoliotic thoracolumbar spine. No acute or suspicious osseous finding. IMPRESSION: 1. Interval surgical changes of the stomach,  compatible with the given history of recent repair of gastric perforation and placement of gastrostomy tube. Circumscribed fluid density collection within the lower mediastinum, chest to the left of the anterior margin of the lower esophagus, measuring 2.7 x 1.6 cm, possibly expected postoperative fluid collection but suspicious for early developing abscess collection. Associated perforation cannot be excluded, but no extraluminal air to confirm. 2. Poorly defined low-density collection adjacent to the stomach cardia, measuring approximately 2.6 x 1.5 cm, expected postsurgical fluid versus additional early developing abscess. 3. Acute appearing pulmonary emboli within segmental pulmonary artery branches to the right lower lobe. Would consider CT pulmonary angiogram for more complete characterization of the pulmonary arteries. 4. Gastrostomy tube appears well positioned. 5. No bowel obstruction.  No free intraperitoneal air. 6. Colonic diverticulosis without evidence of acute diverticulitis. 7. Cholelithiasis without evidence of acute cholecystitis. 8. Aortic atherosclerosis. 9. Small left pleural effusion. Critical Value/emergent results were called by telephone at the time of interpretation on 04/23/2016 at 12:22 pm to Dr. Johnney Killian, who verbally acknowledged these results. Electronically Signed   By: Franki Cabot M.D.   On: 04/23/2016 12:24   Ct Abdomen Pelvis W Contrast  Result Date: 04/04/2016 CLINICAL DATA:  Abdominal and back pain EXAM: CT ABDOMEN AND PELVIS WITH CONTRAST TECHNIQUE: Multidetector CT imaging of the abdomen and pelvis was performed using the standard protocol following bolus administration of intravenous contrast. CONTRAST:  1110mL ISOVUE-300 IOPAMIDOL (ISOVUE-300) INJECTION 61% COMPARISON:  09/11/2015 FINDINGS: Lower chest and abdominal wall: There is a chronic large hiatal hernia with intrathoracic stomach. The GE junction is above the hiatus with chronic organoaxial volvulus. There is  extraluminal gas, fat inflammation, and fluid around the stomach (which is nearly entirely visualized on the delayed phase). The stomach is not overdistended to suggest obstruction. No necrotic segment of bowel or pneumatosis is seen. Gastric vessels normally opacify within the hernia sac. Umbilical hernia containing nonobstructed bowel. Hepatobiliary: No focal liver abnormality.Cholelithiasis. No evidence of biliary obstruction or inflammation. Pancreas: Unremarkable. Spleen: Unremarkable. Adrenals/Urinary Tract: Negative adrenals. No hydronephrosis or stone. Unremarkable bladder. Stomach/Bowel: Bowel findings described above.  Colonic diverticulosis. Reproductive:Fiducial markers in the central right aspect of the prostate gland which has a stable CT appearance. No pelvic adenopathy. Vascular/Lymphatic: Extensive atherosclerotic calcification. No acute vascular abnormality. No mass or adenopathy. Musculoskeletal: Advanced lumbar facet arthropathy with L4-5 and L5-S1 anterolisthesis. Multilevel disc degeneration and spondylosis. Critical Value/emergent results were called by telephone at the time of interpretation on 04/04/2016 at 10:03 pm to Dr. Virgel Manifold , who verbally acknowledged these results. IMPRESSION: 1. Bowel perforation, presumably gastric, with extraluminal gas, fluid, and inflammation in a hiatal hernia sac. There is chronic intrathoracic stomach herniation with organoaxial volvulus. Despite the volvulus, the stomach is not obstructed or distended. 2. Bowel containing umbilical hernia without related obstruction. 3. Cholelithiasis. Electronically Signed   By: Monte Fantasia M.D.   On: 04/04/2016 22:06   Dg Chest Port 1 View  Result Date: 04/23/2016 CLINICAL DATA:  82 year old male with cough and hypotension EXAM: PORTABLE CHEST 1 VIEW COMPARISON:  Chest radiograph dated 04/08/2016 FINDINGS: Single portable view of chest demonstrates emphysematous changes of the lungs. There is mild eventration of  the left hemidiaphragm. No focal consolidation, pleural effusion, or pneumothorax. Stable cardiac silhouette. Osteopenia with degenerative changes of the shoulders. No acute osseous pathology. IMPRESSION: No active disease. Electronically Signed   By: Anner Crete M.D.   On: 04/23/2016 04:56   Dg Chest Port 1 View  Result Date: 04/08/2016 CLINICAL DATA:  Increased wheezing and shortness of breath EXAM: PORTABLE CHEST 1 VIEW COMPARISON:  04/06/2016 FINDINGS: Chronic cardiopericardial enlargement. Stable positioning of central line with tip over the SVC. Improved lung volumes despite interval extubation. Persistent bibasilar opacity that is likely atelectasis. The left diaphragm is now visible. No effusion or pneumothorax. IMPRESSION: Improved lung volumes and decreased atelectasis after extubation. Electronically Signed   By: Monte Fantasia M.D.   On: 04/08/2016 05:52   Dg Chest Port 1 View  Result Date: 04/06/2016 CLINICAL DATA:  Intubation. EXAM: PORTABLE CHEST 1 VIEW COMPARISON:  04/05/2016. FINDINGS: Endotracheal tube 1.8 cm above the carina. Proximal repositioning of approximately 1 -2 cm should be considered. Right IJ line stable position. Heart size stable. Low lung volumes with persist bibasilar atelectasis and/or infiltrates. Small bilateral pleural effusions cannot be excluded. No pneumothorax. IMPRESSION: 1. Endotracheal tube 1.8 cm above the carina. Proximal repositioning of approximately 1-2 cm should be considered . Right IJ line in stable position. 2. Low lung volumes with persistent bibasilar atelectasis and/or infiltrates. Small bilateral pleural effusions cannot be excluded . Electronically Signed   By: Marcello Moores  Register   On: 04/06/2016 06:46   Dg Chest Port 1 View  Result Date: 04/05/2016 CLINICAL DATA:  Postop.  Endotracheal tube. EXAM: PORTABLE CHEST 1 VIEW COMPARISON:  12/31/2015 FINDINGS: Low volume chest with streaky opacity greater on the left. Interstitial coarsening. Stable  heart size. Central line on the right with tip at the SVC level. No pneumothorax. IMPRESSION: 1. Endotracheal tube and right IJ line in good position. No pneumothorax. 2. Postoperative atelectasis and pulmonary venous congestion. Electronically Signed   By: Monte Fantasia M.D.   On: 04/05/2016 03:26   Dg Swallowing Func-speech Pathology  Result Date: 04/13/2016 Objective Swallowing Evaluation: Type of Study: MBS-Modified Barium Swallow Study Patient Details Name: BREX HARDISTER MRN: [id number] Date of Birth: [birth year] Today's Date: 04/13/2016 Time: SLP Start Time (ACUTE ONLY): 1430-SLP Stop Time (ACUTE ONLY): 1500 SLP Time Calculation (min) (ACUTE ONLY): 30 min Past Medical History: Past Medical History: Diagnosis Date . Anemia  . Aortic insufficiency 08/11/2015 . Basal  cell cancer  . Brain tumor ([place])  . GERD (gastroesophageal reflux disease)  . Herniated disc   lumbar spine . Hiatal hernia  . History of radiation therapy   back approximately 2010 prostate radiation under care of Dr. Valere Dross  . Hyperlipidemia  . Hypertension  . Meningioma ([place])   brain . Nocturia  . Occult blood in stools  . OSA (obstructive sleep apnea) 10/28/2014  Moderate with AHI 21/hr . Prostate cancer ([place])  . Restless leg  . Seizures ([place])  Past Surgical History: Past Surgical History: Procedure Laterality Date . APPLICATION OF CRANIAL NAVIGATION N/A [id number]  Procedure: APPLICATION OF CRANIAL NAVIGATION;  Surgeon: Consuella Lose, MD;  Location: [place] [facility];  Service: Neurosurgery;  Laterality: N/A;  APPLICATION OF CRANIAL NAVIGATION . Bilateral cateract surgery   . COLONOSCOPY    colon polyp removed . CRANIOTOMY Right 09/27/2013  Procedure: RIGHT FRONTAL CRANIOTOMY FOR TUMOR RESECTION ;  Surgeon: Consuella Lose, MD;  Location: [place] [facility];  Service: Neurosurgery;  Laterality: Right; . CRANIOTOMY Right 02/17/2015  Procedure: Right frontal parietal craniotomy for resection of meningioma with brainlab;  Surgeon: Consuella Lose, MD;   Location: [place] [facility];  Service: Neurosurgery;  Laterality: Right;  Right frontal parietal craniotomy for resection of meningioma with brainlab . JOINT REPLACEMENT    right knee replacement . LAPAROTOMY N/A 04/04/2016  Procedure: EXPLORATORY LAPAROTOMY, REPAIR OF GASTRIC PERFORATION WITH OMENTAL PATCH, PLACEMENT OF GASTROSTOMY TUBE;  Surgeon: Autumn Messing III, MD;  Location: [facility];  Service: General;  Laterality: N/A; . PROSTATE BIOPSY   . TONSILLECTOMY    Agae 6-7 . VASECTOMY   HPI: 82 yo male adm to [facility] with abdominal pain - pt is s/p surgery with PEG placed 7/17 with intubation a few days.  Per wife, pt has been in ICU and has not had po until yesterday.   Pt started po intake yesteday and was coughing with liquids, therefore diet was changed to nectar liquids and SLP ordered.   Diet was advanced to regular/thin today.  Pt PMH + for meningioma s/p XRT and surgery, anemia, seizures.  Pt CXR shows atelectasis.   Subjective: pt seen in radiology for MBS Assessment / Plan / Recommendation CHL IP CLINICAL IMPRESSIONS 04/13/2016 Therapy Diagnosis Mild oral phase dysphagia;Mild pharyngeal phase dysphagia Clinical Impression Pt presents with mild oropharyngeal dysphagia, characterized by poor bolus formation of puree and solid with posterior spillage to the vallecula, and right anterior leakage of liquids. Swallow reflex was delayed, with trigger at the vallecula on the initial swallow. Poor airway closure resulted in flash penetration of thin liquids during the initial swallow. Nectar thick liquid prevented penetration of the initial swallow, however, Residue of thin and nectar thick liquid on tongue base and vallecula was noted to spill to the pyriform prior to trigger of (cued) dry swallow.  Small boluses and immediate second swallow prevented penetration of nectar thick liquids. Recommend dys 3 solids with chopped meats and nectar thick liquids, SMALL bites and sips at a SLOW rate, with 2 swallows per bite/sip. Meds one  at a time with puree. RN was notified of results and recommendations, and safe swallow precautions were posted at [facility]. ST will follow for education and assessment of diet tolerance. Recommend repeat MBS prior to advancing liquids, given lack of reflexive response to penetration on this study. Impact on safety and function Risk for inadequate nutrition/hydration;Moderate aspiration risk;Mild aspiration risk   CHL IP TREATMENT RECOMMENDATION 04/13/2016 Treatment Recommendations Therapy as outlined in treatment plan below  Prognosis 04/13/2016 Prognosis for Safe Diet Advancement Guarded Barriers to Reach Goals Motivation CHL IP DIET RECOMMENDATION 04/13/2016 SLP Diet Recommendations Dysphagia 3 (Mech soft) solids;Nectar thick liquid Liquid Administration via No straw;Cup Medication Administration Whole meds with puree Compensations Slow rate;Small sips/bites;Clear throat intermittently;Other (Comment);Minimize environmental distractions;Multiple dry swallows after each bite/sip Postural Changes Seated upright at 90 degrees   CHL IP OTHER RECOMMENDATIONS 04/13/2016 Recommended Consults -- Oral Care Recommendations Oral care BID Other Recommendations Order thickener from pharmacy;Prohibited food (jello, ice cream, thin soups);Remove water pitcher   CHL IP FOLLOW UP RECOMMENDATIONS 04/12/2016 Follow up Recommendations To be determined   CHL IP FREQUENCY AND DURATION 04/13/2016 Speech Therapy Frequency (ACUTE ONLY) min 2x/week Treatment Duration 1 week    CHL IP ORAL PHASE 04/13/2016 Oral Phase Impaired Oral - Nectar Cup Right anterior bolus loss Oral - Thin Cup Right anterior bolus loss Oral - Puree Reduced posterior propulsion;Delayed oral transit;Premature spillage Oral - Multi-Consistency Premature spillage;Delayed oral transit;Reduced posterior propulsion  CHL IP PHARYNGEAL PHASE 04/13/2016 Pharyngeal Phase Impaired Pharyngeal- Nectar Cup Delayed swallow initiation-vallecula;Reduced pharyngeal peristalsis;Reduced anterior  laryngeal mobility;Reduced laryngeal elevation;Reduced airway/laryngeal closure;Reduced tongue base retraction;Penetration/Aspiration during swallow;Pharyngeal residue - valleculae;Delayed swallow initiation-pyriform sinuses Pharyngeal Material enters airway, remains ABOVE vocal cords then ejected out Pharyngeal- Thin Cup Delayed swallow initiation-vallecula;Delayed swallow initiation-pyriform sinuses;Reduced pharyngeal peristalsis;Reduced anterior laryngeal mobility;Reduced laryngeal elevation;Reduced airway/laryngeal closure;Pharyngeal residue - valleculae;Reduced tongue base retraction;Penetration/Aspiration during swallow Pharyngeal Material enters airway, remains ABOVE vocal cords then ejected out Pharyngeal- Puree Delayed swallow initiation-vallecula;Reduced tongue base retraction Pharyngeal- Multi-consistency Delayed swallow initiation-vallecula;Reduced tongue base retraction  CHL IP CERVICAL ESOPHAGEAL PHASE 04/13/2016 Cervical Esophageal Phase [facility] Celia B. Merino, [facility], CCC-SLP [id number] Shonna Chock 04/13/2016, 3:19 PM               Lab Data:  CBC:  Recent Labs Lab 04/23/16 0421  04/23/16 0723 04/24/16 0519 04/25/16 0410 04/26/16 0458 04/27/16 0058  WBC 14.2*  --  13.5* 10.8* 9.0 9.1 8.8  NEUTROABS 11.6*  --   --   --   --   --   --   HGB 13.4  < > 13.5 11.1* 10.7* 11.1* 10.6*  HCT 43.6  < > 43.2 36.7* 35.2* 36.3* 34.5*  MCV 91.6  --  90.6 92.0 91.0 90.8 91.5  PLT 277  --  239 283 244 245 228  < > = values in this interval not displayed. Basic Metabolic Panel:  Recent Labs Lab 04/23/16 0421 04/23/16 0445 04/24/16 0519 04/25/16 0410 04/26/16 0458 04/27/16 0425  NA 135 136 135 135 134* 133*  K 3.8 3.7 4.2 3.4* 3.5 3.5  CL 100* 99* 101 104 105 104  CO2 24  --  23 24 22 22   GLUCOSE 110* 106* 103* 86 91 82  BUN 29* 30* 15 11 7 6   CREATININE 1.08 1.00 0.91 0.92 0.90 0.91  CALCIUM 8.7*  --  8.2* 8.0* 8.0* 7.9*   GFR: Estimated Creatinine Clearance: 74.6 mL/min (by C-G  formula based on SCr of 0.91 mg/dL). Liver Function Tests:  Recent Labs Lab 04/23/16 0421  AST 31  ALT 30  ALKPHOS 72  BILITOT 0.5  PROT 5.8*  ALBUMIN 2.4*   No results for input(s): LIPASE, AMYLASE in the last 168 hours. No results for input(s): AMMONIA in the last 168 hours. Coagulation Profile:  Recent Labs Lab 04/23/16 1337  INR 1.10   Cardiac Enzymes: No results for input(s): CKTOTAL, CKMB, CKMBINDEX, TROPONINI in the last 168 hours. BNP (last 3 results) No  results for input(s): PROBNP in the last 8760 hours. HbA1C: No results for input(s): HGBA1C in the last 72 hours. CBG: No results for input(s): GLUCAP in the last 168 hours. Lipid Profile: No results for input(s): CHOL, HDL, LDLCALC, TRIG, CHOLHDL, LDLDIRECT in the last 72 hours. Thyroid Function Tests: No results for input(s): TSH, T4TOTAL, FREET4, T3FREE, THYROIDAB in the last 72 hours. Anemia Panel: No results for input(s): VITAMINB12, FOLATE, FERRITIN, TIBC, IRON, RETICCTPCT in the last 72 hours. Urine analysis:    Component Value Date/Time   COLORURINE YELLOW 04/23/2016 1221   APPEARANCEUR TURBID (A) 04/23/2016 1221   LABSPEC 1.045 (H) 04/23/2016 1221   PHURINE 7.0 04/23/2016 1221   GLUCOSEU NEGATIVE 04/23/2016 1221   HGBUR NEGATIVE 04/23/2016 1221   BILIRUBINUR NEGATIVE 04/23/2016 1221   KETONESUR NEGATIVE 04/23/2016 1221   PROTEINUR 30 (A) 04/23/2016 1221   UROBILINOGEN 0.2 07/31/2014 1941   NITRITE NEGATIVE 04/23/2016 1221   LEUKOCYTESUR NEGATIVE 04/23/2016 1221     Smitty Ackerley M.D. [facility] 04/27/2016, 12:29 PM  Pager: [phone]  After 7pm go to [email or web address] - password [email or web address]  Call night coverage person covering after 7pm

## 2016-04-27 NOTE — Progress Notes (Signed)
Subjective: Patient asking concerned about lack of physical therapy. This is ordered. Gastrostomy site looks good, still small amount of drainage from the open site. His son is in the room and says the rash has markedly improved. Wound VAC is in place.  Objective: Vital signs in last 24 hours: Temp:  [97.6 F (36.4 C)-100 F (37.8 C)] 97.8 F (36.6 C) (08/09 1100) Pulse Rate:  [78-96] 80 (08/09 0430) Resp:  [16-18] 18 (08/09 0740) BP: (128-156)/(66-81) 156/80 (08/09 0430) SpO2:  [94 %-98 %] 97 % (08/09 1100) Weight:  [85.7 kg (189 lb)] 85.7 kg (189 lb) (08/09 0430) Last BM Date: 04/25/16  Intake/Output from previous day: 08/08 0701 - 08/09 0700 In: 2297.7 [P.O.:920; I.V.:1327.7; IV Piggyback:50] Out: 2200 [Urine:2200] Intake/Output this shift: Total I/O In: 480 [P.O.:480] Out: 350 [Urine:350]  General appearance: alert, cooperative and no distress GI: Soft, sore, wound VAC in place. Dressing over the gastrostomy site is dry with minimal drainage on it. Erythema markedly improved..  Lab Results:   Recent Labs  04/26/16 0458 04/27/16 0058  WBC 9.1 8.8  HGB 11.1* 10.6*  HCT 36.3* 34.5*  PLT 245 228    BMET  Recent Labs  04/26/16 0458 04/27/16 0425  NA 134* 133*  K 3.5 3.5  CL 105 104  CO2 22 22  GLUCOSE 91 82  BUN 7 6  CREATININE 0.90 0.91  CALCIUM 8.0* 7.9*   PT/INR No results for input(s): LABPROT, INR in the last 72 hours.   Recent Labs Lab 04/23/16 0421  AST 31  ALT 30  ALKPHOS 72  BILITOT 0.5  PROT 5.8*  ALBUMIN 2.4*     Lipase     Component Value Date/Time   LIPASE 19 04/04/2016 1956     Studies/Results: Ct Head Wo Contrast  Result Date: 04/26/2016 CLINICAL DATA:  Left arm weakness noticed this morning while eating breakfast. EXAM: CT HEAD WITHOUT CONTRAST TECHNIQUE: Contiguous axial images were obtained from the base of the skull through the vertex without intravenous contrast. COMPARISON:  03/21/2016 brain MRI FINDINGS: Brain:  There is known extra axial mass deep to the high right bone flap consistent with meningioma. Multiple areas of marginal nodular disease are poorly seen compared to previous MRI. There is known invasion of the superior sagittal sinus. White matter low-density without notable mass effect in the posterior right frontal, right parietal, and right occipital regions is stable from previous. No superimposed infarct, hemorrhage, or hydrocephalus. Left posterior subdural collection on the previous study is not visualized today. Vascular: Atherosclerotic calcification. Meningioma is known to invade and/or occlude the superior sagittal sinus. Skull: Stable appearance of 2 high right craniotomy sites Sinuses/Orbits: Bilateral cataract resection.  No acute finding. Other: None. IMPRESSION: 1. No acute finding when compared to 03/21/2016 MRI. 2. Multi focal meningioma along the posterior right cerebral convexity with stable vasogenic edema and/or white matter gliosis. Electronically Signed   By: Monte Fantasia M.D.   On: 04/26/2016 10:44    Medications: . amoxicillin-clavulanate  1 tablet Oral Q12H  . benzonatate  100 mg Oral TID  . dexamethasone  2 mg Oral Daily  . famotidine  20 mg Oral Q12H  . lamoTRIgine  100 mg Oral BID  . levETIRAcetam  750 mg Oral BID  . nystatin cream   Topical BID  . pantoprazole  40 mg Oral Q0600  . polyethylene glycol  17 g Oral Daily  . rivaroxaban   Does not apply Once  . Rivaroxaban  15 mg Oral BID  WC  . [START ON 05/19/2016] Rivaroxaban  20 mg Oral Q supper  . rOPINIRole  1.5 mg Oral BID  . senna-docusate  1 tablet Oral BID  . tamsulosin  0.4 mg Oral QPC supper   . sodium chloride 100 mL/hr at 04/26/16 1800   History of Grade II right front meningioma: S/p surgical resection 02/17/15, and radiation therapy     Assessment/Plan Perforated gastric ulcer with large hiatal hernia S/p EXPLORATORY LAPAROTOMY, REPAIR OF GASTRIC PERFORATION WITH OMENTAL PATCH, PLACEMENT OF  GASTROSTOMY TUBE (N/A) Repair of umbilical hernia, [id number], Dr. Autumn Messing III New PE 04/23/16 at SNF Gastric tube with skin errosion - G tube removed 04/25/16 Postop ventilatory dependent respiratory failure/history of obstructive sleep apnea.  Extubated 04/07/16 Postoperative shock- hypovolemic versus septic. Post op delerium Acute kidney injury Anemia - Acute blood loss status post 2 units packed cells in the OR. FEN:  DIII ID: [id number] day 5 converted to Augmentin today DVT: rivaroxaban   Plan: From our standpoint continue the wound VAC until he follows up with Dr. Marlou Starks. The G-tube site can be clean with soap and water and redress daily. More frequently if there is additional drainage from the gastrostomy site. I would use paper tape to minimize skin breakdown. Operative follow-up information in the AVS.  LOS: 4 days    Macil Crady 04/27/2016 [phone]

## 2016-04-27 NOTE — Clinical Social Work Note (Signed)
Clinical Social Work Assessment  Patient Details  Name: [name] MRN: [id number] Date of Birth: June 27, 1931  Date of referral:  04/27/16               Reason for consult:  Discharge Planning, Facility Placement                Permission sought to share information with:  Facility Sport and exercise psychologist, Family Supports Permission granted to share information::  Yes, Verbal Permission Granted  Name::     Sarath Privott  Agency::  [facility]NF[facility]  Relationship::  Wife  Contact Information:     Housing/Transportation Living arrangements for the past 2 months:  Single Family Home Source of Information:  Patient Patient Interpreter Needed:  None Criminal Activity/Legal Involvement Pertinent to Current Situation/Hospitalization:  No - Comment as needed Significant Relationships:  Spouse Lives with:  Spouse Do you feel safe going back to the place where you live?  Yes Need for family participation in patient care:  Yes (Comment)  Care giving concerns:  The patient does not report any care giving concerns. He does not have concerns about returning to [facility] at discharge, but states he wants to see what his wife says about it. CSW has left multiple messages for the wife requesting call backs.    Social Worker assessment / plan:  CSW met with patient at bedside to complete assessment. The patient is resting comfortably in bed. CSW introduced self and explained reason for visit. The patient shares that he was at [facility] for short term rehab and would like to return upon discharge from the hospital. The patient requests that his wife be contacted to confirm this plan as he defers most decisions to her. CSW left multiple messages with wife requesting call back. CSW explained role of CSW and process of getting the patient back to [facility] to the patient. CSW will assist.  Employment status:  Retired Nurse, adult PT Recommendations:  [place] / Referral to community resources:  [place]  Patient/Family's Response to care:  The patient appears happy with the care he has received. CSW unable to reach wife at this time.  Patient/Family's Understanding of and Emotional Response to Diagnosis, Current Treatment, and Prognosis:  The patient appears to have a fair understanding of the reason for his admission and his post DC needs. The patient is coping well with this hospitalization.   Emotional Assessment Appearance:    Attitude/Demeanor/Rapport:  Other (The patient is appropriate and welcoming of CSW.) Affect (typically observed):  Accepting, Appropriate, Calm, Pleasant Orientation:  Oriented to Self, Oriented to Place, Oriented to  Time, Oriented to Situation Alcohol / Substance use:  Not Applicable Psych involvement (Current and /or in the community):  No (Comment)  Discharge Needs  Concerns to be addressed:  Discharge Planning Concerns, Care Coordination Readmission within the last 30 days:  Yes Current discharge risk:  Chronically ill, Physical Impairment Barriers to Discharge:  Continued Medical Work up   Fredderick Phenix B, LCSW 04/27/2016, 11:02 AM

## 2016-04-27 NOTE — Progress Notes (Signed)
[place] for heparin Indication: pulmonary embolus  Allergies  Allergen Reactions  . Asa [Aspirin] Nausea Only  . Other Itching and Rash    Nuts  . Vicodin [Hydrocodone-Acetaminophen] Nausea Only    Patient Measurements: Height: 6\' 6"  (198.1 cm) Weight: 186 lb 8 oz (84.6 kg) IBW/kg (Calculated) : 91.4 Heparin Dosing Weight: 85 kg  Vital Signs: Temp: 99.1 F (37.3 C) (08/09 0000) Temp Source: Oral (08/09 0000) BP: 141/81 (08/09 0000) Pulse Rate: 78 (08/09 0000)  Labs:  Recent Labs  04/24/16 0519 04/25/16 0410 04/26/16 0458 04/26/16 1423 04/27/16 0058  HGB 11.1* 10.7* 11.1*  --  10.6*  HCT 36.7* 35.2* 36.3*  --  34.5*  PLT 283 244 245  --  228  HEPARINUNFRC 0.70 0.64 0.85* 0.73* 0.48  CREATININE 0.91 0.92 0.90  --   --     Estimated Creatinine Clearance: 74.4 mL/min (by C-G formula based on SCr of 0.9 mg/dL).  Assessment: 82 y.o. male with PE for heparin  Goal of Therapy:  Heparin level 0.3-0.7 units/ml Monitor platelets by anticoagulation protocol: Yes   Plan:  Continue Heparin at current rate   Phillis Knack, PharmD, BCPS  04/27/2016 2:58 AM

## 2016-04-27 NOTE — Progress Notes (Signed)
[place] for heparin>>xarelto Indication: pulmonary embolus  Allergies  Allergen Reactions  . Asa [Aspirin] Nausea Only  . Other Itching and Rash    Nuts  . Vicodin [Hydrocodone-Acetaminophen] Nausea Only    Patient Measurements: Height: '6\' 6"'$  (198.1 cm) Weight: 189 lb (85.7 kg) IBW/kg (Calculated) : 91.4 Heparin Dosing Weight: 85 kg  Vital Signs: Temp: 97.8 F (36.6 C) (08/09 1100) Temp Source: Oral (08/09 1100) BP: 156/80 (08/09 0430) Pulse Rate: 80 (08/09 0430)  Labs:  Recent Labs  04/25/16 0410 04/26/16 0458 04/26/16 1423 04/27/16 0058 04/27/16 0425  HGB 10.7* 11.1*  --  10.6*  --   HCT 35.2* 36.3*  --  34.5*  --   PLT 244 245  --  228  --   HEPARINUNFRC 0.64 0.85* 0.73* 0.48  --   CREATININE 0.92 0.90  --   --  0.91    Estimated Creatinine Clearance: 74.6 mL/min (by C-G formula based on SCr of 0.91 mg/dL).  Assessment: 82 y.o. male with R lower lobe PE identified on CT.  To transition from UFH to Xarelto today.  HL therapeutic at 0.48 on 1000 units/hr.  CBC stable, no bleeding reported.   Plan:  Continue heparin drip at 1000 units/hr until 1700 today Then start xarelto 15 mg po BID with meals x 21 days, then start xarelto 20 mg q supper after all 15 mg tablets have been taken or on Thursday 8/31 [place] kit ordered for pt which contains 30 day free first month card  Eudelia Bunch, Pharm.D. [phone] 04/27/2016 12:56 PM

## 2016-04-27 NOTE — Discharge Instructions (Signed)
Information on my medicine - XARELTO (rivaroxaban)  This medication education was reviewed with me or my healthcare representative as part of my discharge preparation.  The pharmacist that spoke with me during my hospital stay was:  Eudelia Bunch, [place]? Xarelto was prescribed to treat blood clots that may have been found in the veins of your legs (deep vein thrombosis) or in your lungs (pulmonary embolism) and to reduce the risk of them occurring again.  What do you need to know about Xarelto? The starting dose is one 15 mg tablet taken TWICE daily with food for the FIRST 21 DAYS then after all 15 mg tablets have been take or on Thursday 8/31 the dose is changed to one 20 mg tablet taken ONCE A DAY with your evening meal.  DO NOT stop taking Xarelto without talking to the health care provider who prescribed the medication.  Refill your prescription for 20 mg tablets before you run out.  After discharge, you should have regular check-up appointments with your healthcare provider that is prescribing your Xarelto.  In the future your dose may need to be changed if your kidney function changes by a significant amount.  What do you do if you miss a dose? If you are taking Xarelto TWICE DAILY and you miss a dose, take it as soon as you remember. You may take two 15 mg tablets (total 30 mg) at the same time then resume your regularly scheduled 15 mg twice daily the next day.  If you are taking Xarelto ONCE DAILY and you miss a dose, take it as soon as you remember on the same day then continue your regularly scheduled once daily regimen the next day. Do not take two doses of Xarelto at the same time.   Important Safety Information Xarelto is a blood thinner medicine that can cause bleeding. You should call your healthcare provider right away if you experience any of the following: ? Bleeding from an injury or your nose that does not stop. ? Unusual  colored urine (red or dark brown) or unusual colored stools (red or black). ? Unusual bruising for unknown reasons. ? A serious fall or if you hit your head (even if there is no bleeding).  Some medicines may interact with Xarelto and might increase your risk of bleeding while on Xarelto. To help avoid this, consult your healthcare provider or pharmacist prior to using any new prescription or non-prescription medications, including herbals, vitamins, non-steroidal anti-inflammatory drugs (NSAIDs) and supplements.  This website has more information on Xarelto: [email or web address]

## 2016-04-27 NOTE — Care Management Important Message (Signed)
Important Message  Patient Details  Name: [name] MRN: [id number] Date of Birth: Jan 31, 1931   Medicare Important Message Given:  Yes    Loann Quill 04/27/2016, 3:05 PM

## 2016-04-28 DIAGNOSIS — N183 Chronic kidney disease, stage 3 (moderate): Secondary | ICD-10-CM

## 2016-04-28 LAB — BASIC METABOLIC PANEL
ANION GAP: 8 (ref 5–15)
CALCIUM: 8.2 mg/dL — AB (ref 8.9–10.3)
CO2: 23 mmol/L (ref 22–32)
CREATININE: 0.8 mg/dL (ref 0.61–1.24)
Chloride: 102 mmol/L (ref 101–111)
GFR calc Af Amer: >60 mL/min (ref >60)
GLUCOSE: 92 mg/dL (ref 65–99)
Potassium: 3.8 mmol/L (ref 3.5–5.1)
Sodium: 133 mmol/L — ABNORMAL LOW (ref 135–145)

## 2016-04-28 LAB — CULTURE, BLOOD (ROUTINE X 2)
Culture: NO GROWTH
Culture: NO GROWTH

## 2016-04-28 LAB — GLUCOSE, CAPILLARY: Glucose-Capillary: 154 mg/dL — ABNORMAL HIGH (ref 65–99)

## 2016-04-28 MED ORDER — POTASSIUM CHLORIDE CRYS ER 20 MEQ PO TBCR
40.0000 meq | EXTENDED_RELEASE_TABLET | Freq: Two times a day (BID) | ORAL | Status: AC
Start: 2016-04-28 — End: 2016-04-28
  Administered 2016-04-28 (×2): 40 meq via ORAL
  Filled 2016-04-28 (×3): qty 2

## 2016-04-28 MED ORDER — FUROSEMIDE 10 MG/ML IJ SOLN
40.0000 mg | Freq: Once | INTRAMUSCULAR | Status: AC
Start: 2016-04-28 — End: 2016-04-28
  Administered 2016-04-28: 40 mg via INTRAVENOUS
  Filled 2016-04-28: qty 4

## 2016-04-28 MED ORDER — FUROSEMIDE 10 MG/ML IJ SOLN
20.0000 mg | Freq: Once | INTRAMUSCULAR | Status: AC
  Administered 2016-04-28: 20 mg via INTRAVENOUS
  Filled 2016-04-28: qty 2

## 2016-04-28 MED ORDER — DEXAMETHASONE 0.5 MG PO TABS
1.0000 mg | ORAL_TABLET | Freq: Every day | ORAL | Status: DC
  Administered 2016-04-28 – 2016-04-30 (×3): 1 mg via ORAL
  Filled 2016-04-28 (×3): qty 2

## 2016-04-28 NOTE — Progress Notes (Signed)
[facility]                                                                              Patient Demographics  [name], is a 82 y.o. male, DOB - 05-11-1931, [id number]  Admit date - 04/23/2016   Admitting Physician Courage Denton Brick, MD  Outpatient Primary MD for the patient is  Melinda Crutch, MD  Outpatient specialists:   LOS - 5  days    Chief Complaint  Patient presents with  . Hypotension  . Emesis       Brief summary   [name] is a 82 y.o. male with medical history significant for seizures, prostate, hypertension, hyperlipidemia, GERD, aortic insufficiency, prostate cancer status post radiation therapy 2010, perforated gastric ulcer status post surgical repair 1 week ago presents to the emergency Department chief complaint of emesis generalized weakness hypotension. Initial evaluation reveals elevated lactic acid leukocytosis CT of the abdomen concerning for early abscess and/or pulmonary emboli. He states he was doing well at [facility] place after his surgery to weeks  On the day of admission, he had generalized weakness and dizziness and has had difficulty bearing weight but is undergoing physical therapy for that. He also reported persistent productive cough since surgery. On the day of admission, he reported coughing up blood-tinged sputum, brown emesis times one episode. He does have a G-tube which he says has been leaking since surgery. EMS was called to the facility for hypotension. E  Patient was found to have bilateral pulmonary embolism with DVT and right heart strain. General surgery is following closely for the leaking G-tube and recent perforated gastric ulcer repair.   Assessment & Plan   Nausea and vomiting, recent perforated gastric ulcer status post repair on 7/17, para esophageal fluid collection - Currently stable, general surgery following, no nausea or vomiting, patient tolerating clears - Per surgery, continue empiric empiric antibiotics  for 1 week - changed to Oral Augmentin 8/9  Leaking G-tube - General surgery following, G-tube removed 8/7 at bedside by Dr.Wilson   - Wound VAC placed at the site, wound care following - per [facility]CS : continue the wound VAC until he follows up with Dr. Marlou Starks. The G-tube site can be cleaned with soap and water and redress daily -  Ok to transition to oral anticoagulation per [facility]  Bilateral PE with right heart strain and DVT - had been on IV heparin, d/w with wife regarding DOACs she was agreeable to this, started Xarelto per pharmacy 8/9  Acute CHF/diastolic-iatrogenic -stop IVF and IV lasix x2 doses today and monitor  Hypertension.  -BP currently stable, continue to hold Avapro   History of seizures/epileptic syndrome, meningioma: No recent seizures. Medications include Keppra and lamictal. Baseline is walking with a walker since surgery unable to ambulate -Wife states he's had 2 surgery and radiation in the past. -Continue home meds   Anemia. Chart review indicates history of iron deficiency anemia. Hemoglobin 13.5 on admission. Provided with IV iron in the past. No signs symptoms of active bleeding -Hemoglobin stable -Monitor  Steroid use. He was placed on steroid taper by his PCP last month after a  fall. He got down to 0.5 daily. This was increased since secondary to recent hospitalization. -cut down decadron to 1mg , then taper off over 1 week  Pulm nodule -needs FU  Code Status: FUll code DVT Prophylaxis: xarelto Family Communication: none at bedside, called and d/w wife Disposition Plan: SNF tomorrow  Time Spent in minutes 25 minutes  Procedures:  CT angiogram chest CT abdomen  Consultants:   General surgery  Antimicrobials:   IV Zosyn 8/5   Medications  Scheduled Meds: . amoxicillin-clavulanate  1 tablet Oral Q12H  . benzonatate  100 mg Oral TID  . dexamethasone  1 mg Oral Daily  . famotidine  20 mg Oral Q12H  . furosemide  20 mg Intravenous Once  .  lamoTRIgine  100 mg Oral BID  . levETIRAcetam  750 mg Oral BID  . nystatin cream   Topical BID  . pantoprazole  40 mg Oral Q0600  . polyethylene glycol  17 g Oral Daily  . potassium chloride  40 mEq Oral BID  . rivaroxaban   Does not apply Once  . Rivaroxaban  15 mg Oral BID WC  . [START ON 05/19/2016] Rivaroxaban  20 mg Oral Q supper  . rOPINIRole  1.5 mg Oral BID  . senna-docusate  1 tablet Oral BID  . tamsulosin  0.4 mg Oral QPC supper   Continuous Infusions:   PRN Meds:.albuterol, bisacodyl, gi cocktail, guaiFENesin, menthol-cetylpyridinium, RESOURCE THICKENUP CLEAR   Antibiotics   Anti-infectives    Start     Dose/Rate Route Frequency Ordered Stop   04/27/16 1000  amoxicillin-clavulanate (AUGMENTIN) 875-125 MG per tablet 1 tablet     1 tablet Oral Every 12 hours 04/27/16 0947     04/24/16 2230  fluconazole (DIFLUCAN) IVPB 200 mg     200 mg 100 mL/hr over 60 Minutes Intravenous  Once 04/24/16 2143 04/25/16 0217   04/23/16 1400  piperacillin-tazobactam (ZOSYN) IVPB 3.375 g  Status:  Discontinued    Comments:  Zosyn 3.375 g IV q8h for CrCl > 20 mL/min   3.375 g 12.5 mL/hr over 240 Minutes Intravenous Every 8 hours 04/23/16 0605 04/27/16 0947   04/23/16 0530  piperacillin-tazobactam (ZOSYN) IVPB 3.375 g     3.375 g 12.5 mL/hr over 240 Minutes Intravenous  Once 04/23/16 0520 04/23/16 1002   04/23/16 0400  piperacillin-tazobactam (ZOSYN) IVPB 3.375 g  Status:  Discontinued     3.375 g 100 mL/hr over 30 Minutes Intravenous  Once 04/23/16 0354 04/23/16 0355        Subjective:   Feels well, no complaints  Objective:   Vitals:   04/27/16 2144 04/28/16 0003 04/28/16 0416 04/28/16 0800  BP: (!) 142/82 (!) 172/87 (!) 155/82   Pulse: 78 75 73   Resp:  (!) 23 (!) 21   Temp: 98.2 F (36.8 C) 97.9 F (36.6 C) 98.3 F (36.8 C) 98.4 F (36.9 C)  TempSrc: Oral Oral Oral   SpO2: 97% 95% 98%   Weight:   85.3 kg (188 lb 1.6 oz)   Height:        Intake/Output Summary (Last  24 hours) at 04/28/16 1231 Last data filed at 04/28/16 0900  Gross per 24 hour  Intake              600 ml  Output             2300 ml  Net            -1700 ml  Wt Readings from Last 3 Encounters:  04/28/16 85.3 kg (188 lb 1.6 oz)  04/22/16 98 kg (216 lb)  04/20/16 98 kg (216 lb)     Exam  General: Alert and oriented x 3, NAD  HEENT:   Neck: Supple, no JVD, no masses  Cardiovascular: S1 S2clear, RRR  Respiratory: bilateral basilar crackles  Gastrointestinal: Soft, nontender, wound vac in place  Ext: no cyanosis clubbing or edema  Neuro: Strength 5/5 in upper and lower extremities, no acute deficits  Skin:   Psych: Normal affect and demeanor, alert and oriented x3    Data Reviewed:  I have personally reviewed following labs and imaging studies  Micro Results Recent Results (from the past 240 hour(s))  Blood Culture (routine x 2)     Status: None (Preliminary result)   Collection Time: 04/23/16  4:21 AM  Result Value Ref Range Status   Specimen Description BLOOD RIGHT ANTECUBITAL  Final   Special Requests IN PEDIATRIC BOTTLE 4CC  Final   Culture NO GROWTH 4 DAYS  Final   Report Status PENDING  Incomplete  Blood Culture (routine x 2)     Status: None (Preliminary result)   Collection Time: 04/23/16  4:35 AM  Result Value Ref Range Status   Specimen Description BLOOD LEFT ANTECUBITAL  Final   Special Requests IN PEDIATRIC BOTTLE 4CC  Final   Culture NO GROWTH 4 DAYS  Final   Report Status PENDING  Incomplete  Urine culture     Status: Abnormal   Collection Time: 04/23/16 12:21 PM  Result Value Ref Range Status   Specimen Description URINE, RANDOM  Final   Special Requests NONE  Final   Culture MULTIPLE SPECIES PRESENT, SUGGEST RECOLLECTION (A)  Final   Report Status 04/24/2016 FINAL  Final  MRSA PCR Screening     Status: None   Collection Time: 04/23/16  4:33 PM  Result Value Ref Range Status   MRSA by PCR NEGATIVE NEGATIVE Final    Comment:          The GeneXpert MRSA Assay (FDA approved for NASAL specimens only), is one component of a comprehensive MRSA colonization surveillance program. It is not intended to diagnose MRSA infection nor to guide or monitor treatment for MRSA infections.     Radiology Reports Ct Head Wo Contrast  Result Date: 04/26/2016 CLINICAL DATA:  Left arm weakness noticed this morning while eating breakfast. EXAM: CT HEAD WITHOUT CONTRAST TECHNIQUE: Contiguous axial images were obtained from the base of the skull through the vertex without intravenous contrast. COMPARISON:  03/21/2016 brain MRI FINDINGS: Brain: There is known extra axial mass deep to the high right bone flap consistent with meningioma. Multiple areas of marginal nodular disease are poorly seen compared to previous MRI. There is known invasion of the superior sagittal sinus. White matter low-density without notable mass effect in the posterior right frontal, right parietal, and right occipital regions is stable from previous. No superimposed infarct, hemorrhage, or hydrocephalus. Left posterior subdural collection on the previous study is not visualized today. Vascular: Atherosclerotic calcification. Meningioma is known to invade and/or occlude the superior sagittal sinus. Skull: Stable appearance of 2 high right craniotomy sites Sinuses/Orbits: Bilateral cataract resection.  No acute finding. Other: None. IMPRESSION: 1. No acute finding when compared to 03/21/2016 MRI. 2. Multi focal meningioma along the posterior right cerebral convexity with stable vasogenic edema and/or white matter gliosis. Electronically Signed   By: Monte Fantasia M.D.   On: 04/26/2016 10:44   Ct  Angio Chest Pe W Or Wo Contrast  Result Date: 04/23/2016 CLINICAL DATA:  Pulmonary emboli identified in the right lower lobe on CT abdomen performed earlier today. Patient is status post recent laparotomy with gastric perforation repair and placement of gastrostomy tube. EXAM: CT  ANGIOGRAPHY CHEST WITH CONTRAST TECHNIQUE: Multidetector CT imaging of the chest was performed using the standard protocol during bolus administration of intravenous contrast. Multiplanar CT image reconstructions and MIPs were obtained to evaluate the vascular anatomy. CONTRAST:  80 cc Isovue 370 COMPARISON:  CT abdomen from earlier today. FINDINGS: Mediastinum/Lymph Nodes: Moderate to large amount of pulmonary embolus is seen at the distal aspects of each main pulmonary artery extending into the central segmental pulmonary artery branches to each lobe bilaterally. Nearly occlusive thrombus is seen within the bilateral intralobar pulmonary artery branches extending into multiple segmental pulmonary artery branches the lower lobes bilaterally. There is occlusive pulmonary embolus within the proximal aspects of the lingular pulmonary artery branch. Thoracic aorta is normal in caliber. Scattered atherosclerotic changes noted along the walls of the thoracic aorta. Heart size is normal. No pericardial effusion. Coronary artery calcifications noted. No masses or enlarged lymph nodes seen within the mediastinum or perihilar regions. As noted on the earlier CT abdomen, there is a circumscribed-appearing collection within the lower mediastinum, adjacent to the anterior margin of the lower esophagus, measuring 2.8 x 1.7 cm, suspicious for early developing abscess. Lungs/Pleura: Small left pleural effusion with adjacent atelectasis. Trace right pleural effusion with adjacent atelectasis. Small ill-defined ground-glass opacities within the upper lobes bilaterally, likely mild edema. Pulmonary nodule within the medial aspects of the right upper lobe measures 1.3 x 1.1 cm (series 6, image 36). Additional nodule within the lingula measures 6 mm (series 6, image 41). Upper abdomen: Small layering stones within the otherwise normal-appearing gallbladder. Small low-density collection adjacent to the lateral margin of the stomach  cardia, as described on the earlier abdomen CT. Musculoskeletal: No acute or suspicious osseous finding. Mild degenerative spurring within the thoracic spine. Mild compression deformity of an upper thoracic vertebral body appears stable. Superficial soft tissues are unremarkable. Review of the MIP images confirms the above findings. IMPRESSION: 1. Moderate to large amount of pulmonary emboli bilaterally. This includes occlusive pulmonary embolus within the lingular pulmonary artery branch and nearly occlusive pulmonary emboli within the distal aspects of each main pulmonary artery extending into multiple segmental lower lobe pulmonary artery branches bilaterally. There is CT evidence of right heart strain (with RV/LV Ratio greater than 1) consistent with at least submassive (intermediate risk) PE. The presence of right heart strain has been associated with an increased risk of morbidity and mortality. Please activate Code PE by paging [phone]. 2. Small left pleural effusion and trace right pleural effusion. 3. Pulmonary nodule within the right upper lobe measuring 1.3 x 1.1 cm. This is new compared to an older chest CT of 04/06/2009. Neoplastic pulmonary nodule is not excluded. Consider further characterization with PET-CT after current issues are resolved. 4. Circumscribed fluid density collection within the lower mediastinum, adjacent to the anterior margin of the distal esophagus, measuring 2.8 x 1.7 cm, better demonstrated on the CT abdomen performed earlier today. On the earlier CT abdomen, this had an appearance suspicious for early developing abscess. Additional possible abscess versus expected postoperative fluid collection was also appreciated adjacent to the stomach cardia. 5. Aortic atherosclerosis. These results were called by telephone at the time of interpretation on 04/23/2016 at 3:24 p.m. to Dr. Denton Brick, who verbally acknowledged these results.  Electronically Signed   By: Franki Cabot M.D.   On:  04/23/2016 15:31   Ct Abdomen Pelvis W Contrast  Result Date: 04/23/2016 CLINICAL DATA:  Discharge from hospital on July 28th after abdominal surgery. Now with sharp pains at surgical site. Hematemesis last night. Status post recent laparotomy with repair of gastric perforation and placement of gastrostomy tube. EXAM: CT ABDOMEN AND PELVIS WITH CONTRAST TECHNIQUE: Multidetector CT imaging of the abdomen and pelvis was performed using the standard protocol following bolus administration of intravenous contrast. CONTRAST:  126mL ISOVUE-300 IOPAMIDOL (ISOVUE-300) INJECTION 61% COMPARISON:  CT abdomen status 04/04/2016 09/11/2015. FINDINGS: Lower chest: Small left pleural effusion and mild bibasilar atelectasis. Multiple acute appearing pulmonary emboli are seen within the segmental pulmonary artery branches to the right lower lobe. Hepatobiliary: Small layering stones within the otherwise normal-appearing gallbladder. Liver appears normal. Pancreas: Partially infiltrated with fat but otherwise unremarkable. Spleen: Within normal limits in size and appearance. Adrenals/Urinary Tract: Adrenal glands appear normal. Kidneys appear normal without mass, stone or hydronephrosis. No ureteral or bladder calculi identified. Bladder appears normal. Stomach/Bowel: Small hiatal hernia, significantly improved in appearance compared to the recent CT of 04/04/2016. There is a circumscribed fluid collection within the lower mediastinum, just to the left of the anterior margin of the lower esophagus, measuring 2.7 x 1.6 cm, suspicious for early developing abscess. Additional smaller less well circumscribed low-density collection is seen adjacent to the stomach cardia, measuring approximately 2.6 x 1.5 cm, possible additional early abscess formation. No dilated large or small bowel loops. Gastrostomy tube appears well positioned. Scattered diverticulosis within the descending and sigmoid colon without evidence of acute diverticulitis.  Vascular/Lymphatic: Scattered atherosclerotic changes of the normal caliber abdominal aorta. No enlarged lymph nodes seen. Reproductive: No mass or other significant abnormality. Other: No free intraperitoneal air. Musculoskeletal: Degenerative changes throughout the slightly scoliotic thoracolumbar spine. No acute or suspicious osseous finding. IMPRESSION: 1. Interval surgical changes of the stomach, compatible with the given history of recent repair of gastric perforation and placement of gastrostomy tube. Circumscribed fluid density collection within the lower mediastinum, chest to the left of the anterior margin of the lower esophagus, measuring 2.7 x 1.6 cm, possibly expected postoperative fluid collection but suspicious for early developing abscess collection. Associated perforation cannot be excluded, but no extraluminal air to confirm. 2. Poorly defined low-density collection adjacent to the stomach cardia, measuring approximately 2.6 x 1.5 cm, expected postsurgical fluid versus additional early developing abscess. 3. Acute appearing pulmonary emboli within segmental pulmonary artery branches to the right lower lobe. Would consider CT pulmonary angiogram for more complete characterization of the pulmonary arteries. 4. Gastrostomy tube appears well positioned. 5. No bowel obstruction.  No free intraperitoneal air. 6. Colonic diverticulosis without evidence of acute diverticulitis. 7. Cholelithiasis without evidence of acute cholecystitis. 8. Aortic atherosclerosis. 9. Small left pleural effusion. Critical Value/emergent results were called by telephone at the time of interpretation on 04/23/2016 at 12:22 pm to Dr. Johnney Killian, who verbally acknowledged these results. Electronically Signed   By: Franki Cabot M.D.   On: 04/23/2016 12:24   Ct Abdomen Pelvis W Contrast  Result Date: 04/04/2016 CLINICAL DATA:  Abdominal and back pain EXAM: CT ABDOMEN AND PELVIS WITH CONTRAST TECHNIQUE: Multidetector CT imaging of  the abdomen and pelvis was performed using the standard protocol following bolus administration of intravenous contrast. CONTRAST:  151mL ISOVUE-300 IOPAMIDOL (ISOVUE-300) INJECTION 61% COMPARISON:  09/11/2015 FINDINGS: Lower chest and abdominal wall: There is a chronic large hiatal hernia with intrathoracic stomach.  The GE junction is above the hiatus with chronic organoaxial volvulus. There is extraluminal gas, fat inflammation, and fluid around the stomach (which is nearly entirely visualized on the delayed phase). The stomach is not overdistended to suggest obstruction. No necrotic segment of bowel or pneumatosis is seen. Gastric vessels normally opacify within the hernia sac. Umbilical hernia containing nonobstructed bowel. Hepatobiliary: No focal liver abnormality.Cholelithiasis. No evidence of biliary obstruction or inflammation. Pancreas: Unremarkable. Spleen: Unremarkable. Adrenals/Urinary Tract: Negative adrenals. No hydronephrosis or stone. Unremarkable bladder. Stomach/Bowel: Bowel findings described above. Colonic diverticulosis. Reproductive:Fiducial markers in the central right aspect of the prostate gland which has a stable CT appearance. No pelvic adenopathy. Vascular/Lymphatic: Extensive atherosclerotic calcification. No acute vascular abnormality. No mass or adenopathy. Musculoskeletal: Advanced lumbar facet arthropathy with L4-5 and L5-S1 anterolisthesis. Multilevel disc degeneration and spondylosis. Critical Value/emergent results were called by telephone at the time of interpretation on 04/04/2016 at 10:03 pm to Dr. Virgel Manifold , who verbally acknowledged these results. IMPRESSION: 1. Bowel perforation, presumably gastric, with extraluminal gas, fluid, and inflammation in a hiatal hernia sac. There is chronic intrathoracic stomach herniation with organoaxial volvulus. Despite the volvulus, the stomach is not obstructed or distended. 2. Bowel containing umbilical hernia without related  obstruction. 3. Cholelithiasis. Electronically Signed   By: Monte Fantasia M.D.   On: 04/04/2016 22:06   Dg Chest Port 1 View  Result Date: 04/23/2016 CLINICAL DATA:  82 year old male with cough and hypotension EXAM: PORTABLE CHEST 1 VIEW COMPARISON:  Chest radiograph dated 04/08/2016 FINDINGS: Single portable view of chest demonstrates emphysematous changes of the lungs. There is mild eventration of the left hemidiaphragm. No focal consolidation, pleural effusion, or pneumothorax. Stable cardiac silhouette. Osteopenia with degenerative changes of the shoulders. No acute osseous pathology. IMPRESSION: No active disease. Electronically Signed   By: Anner Crete M.D.   On: 04/23/2016 04:56   Dg Chest Port 1 View  Result Date: 04/08/2016 CLINICAL DATA:  Increased wheezing and shortness of breath EXAM: PORTABLE CHEST 1 VIEW COMPARISON:  04/06/2016 FINDINGS: Chronic cardiopericardial enlargement. Stable positioning of central line with tip over the SVC. Improved lung volumes despite interval extubation. Persistent bibasilar opacity that is likely atelectasis. The left diaphragm is now visible. No effusion or pneumothorax. IMPRESSION: Improved lung volumes and decreased atelectasis after extubation. Electronically Signed   By: Monte Fantasia M.D.   On: 04/08/2016 05:52   Dg Chest Port 1 View  Result Date: 04/06/2016 CLINICAL DATA:  Intubation. EXAM: PORTABLE CHEST 1 VIEW COMPARISON:  04/05/2016. FINDINGS: Endotracheal tube 1.8 cm above the carina. Proximal repositioning of approximately 1 -2 cm should be considered. Right IJ line stable position. Heart size stable. Low lung volumes with persist bibasilar atelectasis and/or infiltrates. Small bilateral pleural effusions cannot be excluded. No pneumothorax. IMPRESSION: 1. Endotracheal tube 1.8 cm above the carina. Proximal repositioning of approximately 1-2 cm should be considered . Right IJ line in stable position. 2. Low lung volumes with persistent  bibasilar atelectasis and/or infiltrates. Small bilateral pleural effusions cannot be excluded . Electronically Signed   By: Marcello Moores  Register   On: 04/06/2016 06:46   Dg Chest Port 1 View  Result Date: 04/05/2016 CLINICAL DATA:  Postop.  Endotracheal tube. EXAM: PORTABLE CHEST 1 VIEW COMPARISON:  12/31/2015 FINDINGS: Low volume chest with streaky opacity greater on the left. Interstitial coarsening. Stable heart size. Central line on the right with tip at the SVC level. No pneumothorax. IMPRESSION: 1. Endotracheal tube and right IJ line in good position. No pneumothorax. 2.  Postoperative atelectasis and pulmonary venous congestion. Electronically Signed   By: Monte Fantasia M.D.   On: 04/05/2016 03:26   Dg Swallowing Func-speech Pathology  Result Date: 04/13/2016 Objective Swallowing Evaluation: Type of Study: MBS-Modified Barium Swallow Study Patient Details Name: DELSON JOENS MRN: [id number] Date of Birth: 10/12/1930 Today's Date: 04/13/2016 Time: SLP Start Time (ACUTE ONLY): 1430-SLP Stop Time (ACUTE ONLY): 1500 SLP Time Calculation (min) (ACUTE ONLY): 30 min Past Medical History: Past Medical History: Diagnosis Date . Anemia  . Aortic insufficiency 08/11/2015 . Basal cell cancer  . Brain tumor ([place])  . GERD (gastroesophageal reflux disease)  . Herniated disc   lumbar spine . Hiatal hernia  . History of radiation therapy   back approximately 2010 prostate radiation under care of Dr. Valere Dross  . Hyperlipidemia  . Hypertension  . Meningioma ([place])   brain . Nocturia  . Occult blood in stools  . OSA (obstructive sleep apnea) 10/28/2014  Moderate with AHI 21/hr . Prostate cancer ([place])  . Restless leg  . Seizures ([place])  Past Surgical History: Past Surgical History: Procedure Laterality Date . APPLICATION OF CRANIAL NAVIGATION N/A [id number]  Procedure: APPLICATION OF CRANIAL NAVIGATION;  Surgeon: Consuella Lose, MD;  Location: [place] [facility];  Service: Neurosurgery;  Laterality: N/A;  APPLICATION OF CRANIAL NAVIGATION  . Bilateral cateract surgery   . COLONOSCOPY    colon polyp removed . CRANIOTOMY Right 09/27/2013  Procedure: RIGHT FRONTAL CRANIOTOMY FOR TUMOR RESECTION ;  Surgeon: Consuella Lose, MD;  Location: [place] [facility];  Service: Neurosurgery;  Laterality: Right; . CRANIOTOMY Right 02/17/2015  Procedure: Right frontal parietal craniotomy for resection of meningioma with brainlab;  Surgeon: Consuella Lose, MD;  Location: [place] [facility];  Service: Neurosurgery;  Laterality: Right;  Right frontal parietal craniotomy for resection of meningioma with brainlab . JOINT REPLACEMENT    right knee replacement . LAPAROTOMY N/A 04/04/2016  Procedure: EXPLORATORY LAPAROTOMY, REPAIR OF GASTRIC PERFORATION WITH OMENTAL PATCH, PLACEMENT OF GASTROSTOMY TUBE;  Surgeon: Autumn Messing III, MD;  Location: [facility];  Service: General;  Laterality: N/A; . PROSTATE BIOPSY   . TONSILLECTOMY    Agae 6-7 . VASECTOMY   HPI: 82 yo male adm to [facility] with abdominal pain - pt is s/p surgery with PEG placed 7/17 with intubation a few days.  Per wife, pt has been in ICU and has not had po until yesterday.   Pt started po intake yesteday and was coughing with liquids, therefore diet was changed to nectar liquids and SLP ordered.   Diet was advanced to regular/thin today.  Pt PMH + for meningioma s/p XRT and surgery, anemia, seizures.  Pt CXR shows atelectasis.   Subjective: pt seen in radiology for MBS Assessment / Plan / Recommendation CHL IP CLINICAL IMPRESSIONS 04/13/2016 Therapy Diagnosis Mild oral phase dysphagia;Mild pharyngeal phase dysphagia Clinical Impression Pt presents with mild oropharyngeal dysphagia, characterized by poor bolus formation of puree and solid with posterior spillage to the vallecula, and right anterior leakage of liquids. Swallow reflex was delayed, with trigger at the vallecula on the initial swallow. Poor airway closure resulted in flash penetration of thin liquids during the initial swallow. Nectar thick liquid prevented penetration of  the initial swallow, however, Residue of thin and nectar thick liquid on tongue base and vallecula was noted to spill to the pyriform prior to trigger of (cued) dry swallow.  Small boluses and immediate second swallow prevented penetration of nectar thick liquids. Recommend dys 3 solids with chopped meats and nectar thick  liquids, SMALL bites and sips at a SLOW rate, with 2 swallows per bite/sip. Meds one at a time with puree. RN was notified of results and recommendations, and safe swallow precautions were posted at [facility]. ST will follow for education and assessment of diet tolerance. Recommend repeat MBS prior to advancing liquids, given lack of reflexive response to penetration on this study. Impact on safety and function Risk for inadequate nutrition/hydration;Moderate aspiration risk;Mild aspiration risk   CHL IP TREATMENT RECOMMENDATION 04/13/2016 Treatment Recommendations Therapy as outlined in treatment plan below   Prognosis 04/13/2016 Prognosis for Safe Diet Advancement Guarded Barriers to Reach Goals Motivation CHL IP DIET RECOMMENDATION 04/13/2016 SLP Diet Recommendations Dysphagia 3 (Mech soft) solids;Nectar thick liquid Liquid Administration via No straw;Cup Medication Administration Whole meds with puree Compensations Slow rate;Small sips/bites;Clear throat intermittently;Other (Comment);Minimize environmental distractions;Multiple dry swallows after each bite/sip Postural Changes Seated upright at 90 degrees   CHL IP OTHER RECOMMENDATIONS 04/13/2016 Recommended Consults -- Oral Care Recommendations Oral care BID Other Recommendations Order thickener from pharmacy;Prohibited food (jello, ice cream, thin soups);Remove water pitcher   CHL IP FOLLOW UP RECOMMENDATIONS 04/12/2016 Follow up Recommendations To be determined   CHL IP FREQUENCY AND DURATION 04/13/2016 Speech Therapy Frequency (ACUTE ONLY) min 2x/week Treatment Duration 1 week    CHL IP ORAL PHASE 04/13/2016 Oral Phase Impaired Oral - Nectar Cup Right  anterior bolus loss Oral - Thin Cup Right anterior bolus loss Oral - Puree Reduced posterior propulsion;Delayed oral transit;Premature spillage Oral - Multi-Consistency Premature spillage;Delayed oral transit;Reduced posterior propulsion  CHL IP PHARYNGEAL PHASE 04/13/2016 Pharyngeal Phase Impaired Pharyngeal- Nectar Cup Delayed swallow initiation-vallecula;Reduced pharyngeal peristalsis;Reduced anterior laryngeal mobility;Reduced laryngeal elevation;Reduced airway/laryngeal closure;Reduced tongue base retraction;Penetration/Aspiration during swallow;Pharyngeal residue - valleculae;Delayed swallow initiation-pyriform sinuses Pharyngeal Material enters airway, remains ABOVE vocal cords then ejected out Pharyngeal- Thin Cup Delayed swallow initiation-vallecula;Delayed swallow initiation-pyriform sinuses;Reduced pharyngeal peristalsis;Reduced anterior laryngeal mobility;Reduced laryngeal elevation;Reduced airway/laryngeal closure;Pharyngeal residue - valleculae;Reduced tongue base retraction;Penetration/Aspiration during swallow Pharyngeal Material enters airway, remains ABOVE vocal cords then ejected out Pharyngeal- Puree Delayed swallow initiation-vallecula;Reduced tongue base retraction Pharyngeal- Multi-consistency Delayed swallow initiation-vallecula;Reduced tongue base retraction  CHL IP CERVICAL ESOPHAGEAL PHASE 04/13/2016 Cervical Esophageal Phase [facility] Celia B. [place], [facility], CCC-SLP [id number] Shonna Chock 04/13/2016, 3:19 PM               Lab Data:  CBC:  Recent Labs Lab 04/23/16 0421  04/23/16 0723 04/24/16 0519 04/25/16 0410 04/26/16 0458 04/27/16 0058  WBC 14.2*  --  13.5* 10.8* 9.0 9.1 8.8  NEUTROABS 11.6*  --   --   --   --   --   --   HGB 13.4  < > 13.5 11.1* 10.7* 11.1* 10.6*  HCT 43.6  < > 43.2 36.7* 35.2* 36.3* 34.5*  MCV 91.6  --  90.6 92.0 91.0 90.8 91.5  PLT 277  --  239 283 244 245 228  < > = values in this interval not displayed. Basic Metabolic Panel:  Recent Labs Lab  04/24/16 0519 04/25/16 0410 04/26/16 0458 04/27/16 0425 04/28/16 0330  NA 135 135 134* 133* 133*  K 4.2 3.4* 3.5 3.5 3.8  CL 101 104 105 104 102  CO2 23 24 22 22 23   GLUCOSE 103* 86 91 82 92  BUN 15 11 7 6  <5*  CREATININE 0.91 0.92 0.90 0.91 0.80  CALCIUM 8.2* 8.0* 8.0* 7.9* 8.2*   GFR: Estimated Creatinine Clearance: 84.4 mL/min (by C-G formula based on SCr of 0.8 mg/dL). Liver Function Tests:  Recent Labs Lab 04/23/16 0421  AST 31  ALT 30  ALKPHOS 72  BILITOT 0.5  PROT 5.8*  ALBUMIN 2.4*   No results for input(s): LIPASE, AMYLASE in the last 168 hours. No results for input(s): AMMONIA in the last 168 hours. Coagulation Profile:  Recent Labs Lab 04/23/16 1337  INR 1.10   Cardiac Enzymes: No results for input(s): CKTOTAL, CKMB, CKMBINDEX, TROPONINI in the last 168 hours. BNP (last 3 results) No results for input(s): PROBNP in the last 8760 hours. HbA1C: No results for input(s): HGBA1C in the last 72 hours. CBG:  Recent Labs Lab 04/28/16 1215  GLUCAP 154*   Lipid Profile: No results for input(s): CHOL, HDL, LDLCALC, TRIG, CHOLHDL, LDLDIRECT in the last 72 hours. Thyroid Function Tests: No results for input(s): TSH, T4TOTAL, FREET4, T3FREE, THYROIDAB in the last 72 hours. Anemia Panel: No results for input(s): VITAMINB12, FOLATE, FERRITIN, TIBC, IRON, RETICCTPCT in the last 72 hours. Urine analysis:    Component Value Date/Time   COLORURINE YELLOW 04/23/2016 1221   APPEARANCEUR TURBID (A) 04/23/2016 1221   LABSPEC 1.045 (H) 04/23/2016 1221   PHURINE 7.0 04/23/2016 1221   GLUCOSEU NEGATIVE 04/23/2016 1221   HGBUR NEGATIVE 04/23/2016 1221   BILIRUBINUR NEGATIVE 04/23/2016 1221   KETONESUR NEGATIVE 04/23/2016 1221   PROTEINUR 30 (A) 04/23/2016 1221   UROBILINOGEN 0.2 07/31/2014 1941   NITRITE NEGATIVE 04/23/2016 1221   LEUKOCYTESUR NEGATIVE 04/23/2016 1221     Benjamen Koelling M.D. [facility] 04/28/2016, 12:31 PM  Pager: [phone]  After  7pm go to [email or web address] - password [email or web address]  Call night coverage person covering after 7pm

## 2016-04-28 NOTE — Progress Notes (Signed)
Progress Note: General Surgery Service   Subjective: Patient continues to complain of lack of physical therapy, tolerating some food but complaining about quality  Objective: Vital signs in last 24 hours: Temp:  [97.9 F (36.6 C)-98.7 F (37.1 C)] 98.4 F (36.9 C) (08/10 1406) Pulse Rate:  [73-80] 80 (08/10 1406) Resp:  [21-23] 22 (08/10 1406) BP: (88-172)/(64-87) 88/64 (08/10 1406) SpO2:  [95 %-98 %] 97 % (08/10 1406) Weight:  [85.3 kg (188 lb 1.6 oz)] 85.3 kg (188 lb 1.6 oz) (08/10 0416) Last BM Date: 04/25/16  Intake/Output from previous day: 08/09 0701 - 08/10 0700 In: 720 [P.O.:720] Out: 2300 [Urine:2300] Intake/Output this shift: Total I/O In: 120 [P.O.:120] Out: 2250 [Urine:2250]  Abd: soft, NT, vac in place, LUQ wound with small amount drainage  Extremities: no edema  Neuro: AOx4  Lab Results: CBC   Recent Labs  04/26/16 0458 04/27/16 0058  WBC 9.1 8.8  HGB 11.1* 10.6*  HCT 36.3* 34.5*  PLT 245 228   BMET  Recent Labs  04/27/16 0425 04/28/16 0330  NA 133* 133*  K 3.5 3.8  CL 104 102  CO2 22 23  GLUCOSE 82 92  BUN 6 <5*  CREATININE 0.91 0.80  CALCIUM 7.9* 8.2*   PT/INR No results for input(s): LABPROT, INR in the last 72 hours. ABG No results for input(s): PHART, HCO3 in the last 72 hours.  Invalid input(s): PCO2, PO2  Studies/Results:  Anti-infectives: Anti-infectives    Start     Dose/Rate Route Frequency Ordered Stop   04/27/16 1000  amoxicillin-clavulanate (AUGMENTIN) 875-125 MG per tablet 1 tablet     1 tablet Oral Every 12 hours 04/27/16 0947     04/24/16 2230  fluconazole (DIFLUCAN) IVPB 200 mg     200 mg 100 mL/hr over 60 Minutes Intravenous  Once 04/24/16 2143 04/25/16 0217   04/23/16 1400  piperacillin-tazobactam (ZOSYN) IVPB 3.375 g  Status:  Discontinued    Comments:  Zosyn 3.375 g IV q8h for CrCl > 20 mL/min   3.375 g 12.5 mL/hr over 240 Minutes Intravenous Every 8 hours 04/23/16 0605 04/27/16 0947   04/23/16 0530   piperacillin-tazobactam (ZOSYN) IVPB 3.375 g     3.375 g 12.5 mL/hr over 240 Minutes Intravenous  Once 04/23/16 0520 04/23/16 1002   04/23/16 0400  piperacillin-tazobactam (ZOSYN) IVPB 3.375 g  Status:  Discontinued     3.375 g 100 mL/hr over 30 Minutes Intravenous  Once 04/23/16 0354 04/23/16 0355      Medications: Scheduled Meds: . amoxicillin-clavulanate  1 tablet Oral Q12H  . benzonatate  100 mg Oral TID  . dexamethasone  1 mg Oral Daily  . famotidine  20 mg Oral Q12H  . furosemide  20 mg Intravenous Once  . lamoTRIgine  100 mg Oral BID  . levETIRAcetam  750 mg Oral BID  . nystatin cream   Topical BID  . pantoprazole  40 mg Oral Q0600  . polyethylene glycol  17 g Oral Daily  . potassium chloride  40 mEq Oral BID  . rivaroxaban   Does not apply Once  . Rivaroxaban  15 mg Oral BID WC  . [START ON 05/19/2016] Rivaroxaban  20 mg Oral Q supper  . rOPINIRole  1.5 mg Oral BID  . senna-docusate  1 tablet Oral BID  . tamsulosin  0.4 mg Oral QPC supper   Continuous Infusions:  PRN Meds:.albuterol, bisacodyl, gi cocktail, guaiFENesin, menthol-cetylpyridinium, RESOURCE THICKENUP CLEAR  Assessment/Plan: Patient Active Problem List   Diagnosis  Date Noted  . Lactic acidosis 04/23/2016  . Leukocytosis 04/23/2016  . Nausea & vomiting 04/23/2016  . Nausea 04/23/2016  . Skin irritation 04/23/2016  . History of recent steroid use 04/23/2016  . Pulmonary embolus ([place]) 04/23/2016  . Cough 04/22/2016  . Acute delirium 04/08/2016  . Perforated bowel ([place]) 04/04/2016  . Humerus fracture 12/31/2015  . Closed right humeral fracture 12/31/2015  . Aortic insufficiency 08/11/2015  . 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 Atypical meningioma of brain ([place]) 02/17/2015  . Obesity (BMI 30-39.9) 02/13/2015  . OSA (obstructive sleep apnea) 10/28/2014  . SOB  (shortness of breath) 07/31/2014  . Fatigue due to excessive exertion 07/31/2014  . Edema extremities 07/31/2014  . Hypersomnia 07/31/2014  . Meningioma, recurrent of brain ([place]) 09/27/2013  . Unspecified constipation 09/26/2013  . Anemia, iron deficiency 09/26/2013  . Dizziness 09/20/2013  . Fall at home 09/20/2013  . Dehydration 09/20/2013  . Orthostasis 09/20/2013  . CKD (chronic kidney disease), stage III 09/20/2013  . Brain mass 09/20/2013  . Gait instability 08/20/2013  . Hypokalemia 10/09/2012  . Ileus ([place]) 10/08/2012  . Acute kidney injury ([place]) 10/08/2012  . Hypertension 10/08/2012  . Hyperlipidemia 10/08/2012  . GERD (gastroesophageal reflux disease) 10/08/2012  . Restless leg syndrome 10/08/2012   -ok to transfer to SNF from surgical standpoint -note low blood pressure and patient complaining of lightheadedness on standing and discussed how this may make it difficult to work with PT   LOS: 5 days   Mickeal Skinner, MD Pg# [phone] [facility]

## 2016-04-28 NOTE — Progress Notes (Signed)
PT Cancellation Note  Patient Details Name: [name] MRN: [id number] DOB: 1931/05/19   Cancelled Treatment:    Reason Eval/Treat Not Completed: Other (comment) (pt is meeting with chaplains with family and will try later).   Ramond Dial 04/28/2016, 12:39 PM    Mee Hives, PT MS Acute Rehab Dept. Number: [place] and [place]

## 2016-04-29 ENCOUNTER — Other Ambulatory Visit: Payer: Medicare Other

## 2016-04-29 ENCOUNTER — Ambulatory Visit: Payer: Medicare Other | Admitting: Oncology

## 2016-04-29 ENCOUNTER — Ambulatory Visit: Payer: Medicare Other

## 2016-04-29 LAB — CBC
HEMATOCRIT: 38.8 % — AB (ref 39.0–52.0)
Hemoglobin: 12 g/dL — ABNORMAL LOW (ref 13.0–17.0)
MCH: 28.3 pg (ref 26.0–34.0)
MCHC: 30.9 g/dL (ref 30.0–36.0)
MCV: 91.5 fL (ref 78.0–100.0)
Platelets: 282 10*3/uL (ref 150–400)
RBC: 4.24 MIL/uL (ref 4.22–5.81)
RDW: 19.1 % — ABNORMAL HIGH (ref 11.5–15.5)
WBC: 9.6 10*3/uL (ref 4.0–10.5)

## 2016-04-29 LAB — BASIC METABOLIC PANEL
ANION GAP: 8 (ref 5–15)
BUN: 7 mg/dL (ref 6–20)
CHLORIDE: 99 mmol/L — AB (ref 101–111)
CO2: 26 mmol/L (ref 22–32)
Calcium: 8.6 mg/dL — ABNORMAL LOW (ref 8.9–10.3)
Creatinine, Ser: 0.9 mg/dL (ref 0.61–1.24)
GFR calc non Af Amer: >60 mL/min (ref >60)
Glucose, Bld: 91 mg/dL (ref 65–99)
POTASSIUM: 4.2 mmol/L (ref 3.5–5.1)
SODIUM: 133 mmol/L — AB (ref 135–145)

## 2016-04-29 MED ORDER — SODIUM CHLORIDE 0.9 % IV BOLUS (SEPSIS)
250.0000 mL | Freq: Once | INTRAVENOUS | Status: AC
  Administered 2016-04-29: 250 mL via INTRAVENOUS

## 2016-04-29 NOTE — Progress Notes (Signed)
[facility]                                                                              Patient Demographics  [name], is a 82 y.o. male, DOB - [birth year], [id number]  Admit date - 04/23/2016   Admitting Physician Courage Denton Brick, MD  Outpatient Primary MD for the patient is  Melinda Crutch, MD  Outpatient specialists:   LOS - 6  days    Chief Complaint  Patient presents with  . Hypotension  . Emesis       Brief summary   [name] is a 82 y.o. male with medical history significant for seizures, prostate, hypertension, hyperlipidemia, GERD, aortic insufficiency, prostate cancer status post radiation therapy 2010, perforated gastric ulcer status post surgical repair 1 week ago presents to the emergency Department chief complaint of emesis generalized weakness hypotension. Initial evaluation reveals elevated lactic acid leukocytosis CT of the abdomen concerning for early abscess and/or pulmonary emboli. He states he was doing well at [facility] place after his surgery to weeks  On the day of admission, he had generalized weakness and dizziness and has had difficulty bearing weight but is undergoing physical therapy for that. He also reported persistent productive cough since surgery. On the day of admission, he reported coughing up blood-tinged sputum, brown emesis times one episode. He does have a G-tube which he says has been leaking since surgery. EMS was called to the facility for hypotension. E  Patient was found to have bilateral pulmonary embolism with DVT and right heart strain. General surgery is following closely for the leaking G-tube and recent perforated gastric ulcer repair. -s/p G tube removal -for PE, heparin changed to xarelto  Assessment & Plan   Nausea and vomiting, recent perforated gastric ulcer status post repair on 7/17, para esophageal fluid collection - Currently stable, general surgery following, no nausea or vomiting, patient tolerating  clears - Per surgery, continue empiric empiric antibiotics for 1 week - changed to Oral Augmentin 8/9, continue for 48more days  Leaking G-tube - General surgery following, G-tube removed 8/7 at bedside by Dr.Wilson   - Wound VAC placed at the site, wound care following - per [facility] : continue the wound VAC until he follows up with Dr. Marlou Starks. The G-tube site can be cleaned with soap and water and redress daily -  Ok to transition to oral anticoagulation per [facility]  Bilateral PE with right heart strain and DVT - had been on IV heparin, d/w with wife regarding DOACs she was agreeable to this, started Xarelto per pharmacy 8/9  Acute CHF/diastolic-iatrogenic -stopped IVF and IV lasix x2 doses yesterday -Bp soft now, diuretics on hold  Hypertension.  -Bp in 80s-90 range and weak, give 250cc bolus, monitor  History of seizures/epileptic syndrome, meningioma: No recent seizures. Medications include Keppra and lamictal. Baseline is walking with a walker since surgery unable to ambulate -Wife states he's had 2 surgery and radiation in the past. -Continue home meds   Anemia. Chart review indicates history of iron deficiency anemia. Hemoglobin 13.5 on admission. Provided with IV iron in the past. No signs symptoms of active bleeding -  Hemoglobin stable -Monitor  Steroid use. He was placed on steroid taper by his PCP last month after a fall. He got down to 0.5 daily. This was increased since secondary to recent hospitalization. -cut down decadron to 1mg , then taper off over 1 week  Pulm nodule -needs FU  Code Status: FUll code DVT Prophylaxis: xarelto Family Communication: none at bedside, called and d/w wife Disposition Plan: SNF tomorrow if BP better  Time Spent in minutes 25 minutes  Procedures:  CT angiogram chest CT abdomen  Consultants:   General surgery  Antimicrobials:   IV Zosyn 8/5   Medications  Scheduled Meds: . amoxicillin-clavulanate  1 tablet Oral Q12H  .  benzonatate  100 mg Oral TID  . dexamethasone  1 mg Oral Daily  . famotidine  20 mg Oral Q12H  . lamoTRIgine  100 mg Oral BID  . levETIRAcetam  750 mg Oral BID  . nystatin cream   Topical BID  . pantoprazole  40 mg Oral Q0600  . polyethylene glycol  17 g Oral Daily  . rivaroxaban   Does not apply Once  . Rivaroxaban  15 mg Oral BID WC  . [START ON 05/19/2016] Rivaroxaban  20 mg Oral Q supper  . rOPINIRole  1.5 mg Oral BID  . senna-docusate  1 tablet Oral BID  . tamsulosin  0.4 mg Oral QPC supper   Continuous Infusions:   PRN Meds:.albuterol, bisacodyl, gi cocktail, guaiFENesin, menthol-cetylpyridinium, RESOURCE THICKENUP CLEAR   Antibiotics   Anti-infectives    Start     Dose/Rate Route Frequency Ordered Stop   04/27/16 1000  amoxicillin-clavulanate (AUGMENTIN) 875-125 MG per tablet 1 tablet     1 tablet Oral Every 12 hours 04/27/16 0947     04/24/16 2230  fluconazole (DIFLUCAN) IVPB 200 mg     200 mg 100 mL/hr over 60 Minutes Intravenous  Once 04/24/16 2143 04/25/16 0217   04/23/16 1400  piperacillin-tazobactam (ZOSYN) IVPB 3.375 g  Status:  Discontinued    Comments:  Zosyn 3.375 g IV q8h for CrCl > 20 mL/min   3.375 g 12.5 mL/hr over 240 Minutes Intravenous Every 8 hours 04/23/16 0605 04/27/16 0947   04/23/16 0530  piperacillin-tazobactam (ZOSYN) IVPB 3.375 g     3.375 g 12.5 mL/hr over 240 Minutes Intravenous  Once 04/23/16 0520 04/23/16 1002   04/23/16 0400  piperacillin-tazobactam (ZOSYN) IVPB 3.375 g  Status:  Discontinued     3.375 g 100 mL/hr over 30 Minutes Intravenous  Once 04/23/16 0354 04/23/16 0355        Subjective:   Feels well, no complaints  Objective:   Vitals:   04/29/16 1144 04/29/16 1219 04/29/16 1222 04/29/16 1223  BP:  (!) 75/55 (!) 88/52 (!) 85/53  Pulse: 82     Resp:  15 19   Temp: 98.1 F (36.7 C)     TempSrc: Oral     SpO2: 93%     Weight:      Height:        Intake/Output Summary (Last 24 hours) at 04/29/16 1335 Last data  filed at 04/29/16 1046  Gross per 24 hour  Intake              480 ml  Output             1200 ml  Net             -720 ml     Wt Readings from Last 3 Encounters:  04/29/16 82.5  kg (181 lb 14.4 oz)  04/22/16 98 kg (216 lb)  04/20/16 98 kg (216 lb)     Exam  General: Alert and oriented x 3, NAD, chronically ill appearing  Neck: Supple, no JVD, no masses  Cardiovascular: S1 S2clear, RRR  Respiratory: bilateral basilar crackles  Gastrointestinal: Soft, nontender, wound vac in place  Ext: no cyanosis clubbing or edema  Neuro: Strength 5/5 in upper and lower extremities, no acute deficits  Skin: no rashes  Psych: Normal affect and demeanor, alert and oriented x3    Data Reviewed:  I have personally reviewed following labs and imaging studies  Micro Results Recent Results (from the past 240 hour(s))  Blood Culture (routine x 2)     Status: None   Collection Time: 04/23/16  4:21 AM  Result Value Ref Range Status   Specimen Description BLOOD RIGHT ANTECUBITAL  Final   Special Requests IN PEDIATRIC BOTTLE 4CC  Final   Culture NO GROWTH 5 DAYS  Final   Report Status 04/28/2016 FINAL  Final  Blood Culture (routine x 2)     Status: None   Collection Time: 04/23/16  4:35 AM  Result Value Ref Range Status   Specimen Description BLOOD LEFT ANTECUBITAL  Final   Special Requests IN PEDIATRIC BOTTLE 4CC  Final   Culture NO GROWTH 5 DAYS  Final   Report Status 04/28/2016 FINAL  Final  Urine culture     Status: Abnormal   Collection Time: 04/23/16 12:21 PM  Result Value Ref Range Status   Specimen Description URINE, RANDOM  Final   Special Requests NONE  Final   Culture MULTIPLE SPECIES PRESENT, SUGGEST RECOLLECTION (A)  Final   Report Status 04/24/2016 FINAL  Final  MRSA PCR Screening     Status: None   Collection Time: 04/23/16  4:33 PM  Result Value Ref Range Status   MRSA by PCR NEGATIVE NEGATIVE Final    Comment:        The GeneXpert MRSA Assay (FDA approved for  NASAL specimens only), is one component of a comprehensive MRSA colonization surveillance program. It is not intended to diagnose MRSA infection nor to guide or monitor treatment for MRSA infections.     Radiology Reports Ct Head Wo Contrast  Result Date: 04/26/2016 CLINICAL DATA:  Left arm weakness noticed this morning while eating breakfast. EXAM: CT HEAD WITHOUT CONTRAST TECHNIQUE: Contiguous axial images were obtained from the base of the skull through the vertex without intravenous contrast. COMPARISON:  03/21/2016 brain MRI FINDINGS: Brain: There is known extra axial mass deep to the high right bone flap consistent with meningioma. Multiple areas of marginal nodular disease are poorly seen compared to previous MRI. There is known invasion of the superior sagittal sinus. White matter low-density without notable mass effect in the posterior right frontal, right parietal, and right occipital regions is stable from previous. No superimposed infarct, hemorrhage, or hydrocephalus. Left posterior subdural collection on the previous study is not visualized today. Vascular: Atherosclerotic calcification. Meningioma is known to invade and/or occlude the superior sagittal sinus. Skull: Stable appearance of 2 high right craniotomy sites Sinuses/Orbits: Bilateral cataract resection.  No acute finding. Other: None. IMPRESSION: 1. No acute finding when compared to 03/21/2016 MRI. 2. Multi focal meningioma along the posterior right cerebral convexity with stable vasogenic edema and/or white matter gliosis. Electronically Signed   By: Monte Fantasia M.D.   On: 04/26/2016 10:44   Ct Angio Chest Pe W Or Wo Contrast  Result Date: 04/23/2016  CLINICAL DATA:  Pulmonary emboli identified in the right lower lobe on CT abdomen performed earlier today. Patient is status post recent laparotomy with gastric perforation repair and placement of gastrostomy tube. EXAM: CT ANGIOGRAPHY CHEST WITH CONTRAST TECHNIQUE:  Multidetector CT imaging of the chest was performed using the standard protocol during bolus administration of intravenous contrast. Multiplanar CT image reconstructions and MIPs were obtained to evaluate the vascular anatomy. CONTRAST:  80 cc Isovue 370 COMPARISON:  CT abdomen from earlier today. FINDINGS: Mediastinum/Lymph Nodes: Moderate to large amount of pulmonary embolus is seen at the distal aspects of each main pulmonary artery extending into the central segmental pulmonary artery branches to each lobe bilaterally. Nearly occlusive thrombus is seen within the bilateral intralobar pulmonary artery branches extending into multiple segmental pulmonary artery branches the lower lobes bilaterally. There is occlusive pulmonary embolus within the proximal aspects of the lingular pulmonary artery branch. Thoracic aorta is normal in caliber. Scattered atherosclerotic changes noted along the walls of the thoracic aorta. Heart size is normal. No pericardial effusion. Coronary artery calcifications noted. No masses or enlarged lymph nodes seen within the mediastinum or perihilar regions. As noted on the earlier CT abdomen, there is a circumscribed-appearing collection within the lower mediastinum, adjacent to the anterior margin of the lower esophagus, measuring 2.8 x 1.7 cm, suspicious for early developing abscess. Lungs/Pleura: Small left pleural effusion with adjacent atelectasis. Trace right pleural effusion with adjacent atelectasis. Small ill-defined ground-glass opacities within the upper lobes bilaterally, likely mild edema. Pulmonary nodule within the medial aspects of the right upper lobe measures 1.3 x 1.1 cm (series 6, image 36). Additional nodule within the lingula measures 6 mm (series 6, image 41). Upper abdomen: Small layering stones within the otherwise normal-appearing gallbladder. Small low-density collection adjacent to the lateral margin of the stomach cardia, as described on the earlier abdomen CT.  Musculoskeletal: No acute or suspicious osseous finding. Mild degenerative spurring within the thoracic spine. Mild compression deformity of an upper thoracic vertebral body appears stable. Superficial soft tissues are unremarkable. Review of the MIP images confirms the above findings. IMPRESSION: 1. Moderate to large amount of pulmonary emboli bilaterally. This includes occlusive pulmonary embolus within the lingular pulmonary artery branch and nearly occlusive pulmonary emboli within the distal aspects of each main pulmonary artery extending into multiple segmental lower lobe pulmonary artery branches bilaterally. There is CT evidence of right heart strain (with RV/LV Ratio greater than 1) consistent with at least submassive (intermediate risk) PE. The presence of right heart strain has been associated with an increased risk of morbidity and mortality. Please activate Code PE by paging [phone]. 2. Small left pleural effusion and trace right pleural effusion. 3. Pulmonary nodule within the right upper lobe measuring 1.3 x 1.1 cm. This is new compared to an older chest CT of 04/06/2009. Neoplastic pulmonary nodule is not excluded. Consider further characterization with PET-CT after current issues are resolved. 4. Circumscribed fluid density collection within the lower mediastinum, adjacent to the anterior margin of the distal esophagus, measuring 2.8 x 1.7 cm, better demonstrated on the CT abdomen performed earlier today. On the earlier CT abdomen, this had an appearance suspicious for early developing abscess. Additional possible abscess versus expected postoperative fluid collection was also appreciated adjacent to the stomach cardia. 5. Aortic atherosclerosis. These results were called by telephone at the time of interpretation on 04/23/2016 at 3:24 p.m. to Dr. Denton Brick, who verbally acknowledged these results. Electronically Signed   By: Roxy Horseman.D.  On: 04/23/2016 15:31   Ct Abdomen Pelvis W  Contrast  Result Date: 04/23/2016 CLINICAL DATA:  Discharge from hospital on July 28th after abdominal surgery. Now with sharp pains at surgical site. Hematemesis last night. Status post recent laparotomy with repair of gastric perforation and placement of gastrostomy tube. EXAM: CT ABDOMEN AND PELVIS WITH CONTRAST TECHNIQUE: Multidetector CT imaging of the abdomen and pelvis was performed using the standard protocol following bolus administration of intravenous contrast. CONTRAST:  111mL ISOVUE-300 IOPAMIDOL (ISOVUE-300) INJECTION 61% COMPARISON:  CT abdomen status 04/04/2016 09/11/2015. FINDINGS: Lower chest: Small left pleural effusion and mild bibasilar atelectasis. Multiple acute appearing pulmonary emboli are seen within the segmental pulmonary artery branches to the right lower lobe. Hepatobiliary: Small layering stones within the otherwise normal-appearing gallbladder. Liver appears normal. Pancreas: Partially infiltrated with fat but otherwise unremarkable. Spleen: Within normal limits in size and appearance. Adrenals/Urinary Tract: Adrenal glands appear normal. Kidneys appear normal without mass, stone or hydronephrosis. No ureteral or bladder calculi identified. Bladder appears normal. Stomach/Bowel: Small hiatal hernia, significantly improved in appearance compared to the recent CT of 04/04/2016. There is a circumscribed fluid collection within the lower mediastinum, just to the left of the anterior margin of the lower esophagus, measuring 2.7 x 1.6 cm, suspicious for early developing abscess. Additional smaller less well circumscribed low-density collection is seen adjacent to the stomach cardia, measuring approximately 2.6 x 1.5 cm, possible additional early abscess formation. No dilated large or small bowel loops. Gastrostomy tube appears well positioned. Scattered diverticulosis within the descending and sigmoid colon without evidence of acute diverticulitis. Vascular/Lymphatic: Scattered  atherosclerotic changes of the normal caliber abdominal aorta. No enlarged lymph nodes seen. Reproductive: No mass or other significant abnormality. Other: No free intraperitoneal air. Musculoskeletal: Degenerative changes throughout the slightly scoliotic thoracolumbar spine. No acute or suspicious osseous finding. IMPRESSION: 1. Interval surgical changes of the stomach, compatible with the given history of recent repair of gastric perforation and placement of gastrostomy tube. Circumscribed fluid density collection within the lower mediastinum, chest to the left of the anterior margin of the lower esophagus, measuring 2.7 x 1.6 cm, possibly expected postoperative fluid collection but suspicious for early developing abscess collection. Associated perforation cannot be excluded, but no extraluminal air to confirm. 2. Poorly defined low-density collection adjacent to the stomach cardia, measuring approximately 2.6 x 1.5 cm, expected postsurgical fluid versus additional early developing abscess. 3. Acute appearing pulmonary emboli within segmental pulmonary artery branches to the right lower lobe. Would consider CT pulmonary angiogram for more complete characterization of the pulmonary arteries. 4. Gastrostomy tube appears well positioned. 5. No bowel obstruction.  No free intraperitoneal air. 6. Colonic diverticulosis without evidence of acute diverticulitis. 7. Cholelithiasis without evidence of acute cholecystitis. 8. Aortic atherosclerosis. 9. Small left pleural effusion. Critical Value/emergent results were called by telephone at the time of interpretation on 04/23/2016 at 12:22 pm to Dr. Johnney Killian, who verbally acknowledged these results. Electronically Signed   By: Franki Cabot M.D.   On: 04/23/2016 12:24   Ct Abdomen Pelvis W Contrast  Result Date: 04/04/2016 CLINICAL DATA:  Abdominal and back pain EXAM: CT ABDOMEN AND PELVIS WITH CONTRAST TECHNIQUE: Multidetector CT imaging of the abdomen and pelvis was  performed using the standard protocol following bolus administration of intravenous contrast. CONTRAST:  150mL ISOVUE-300 IOPAMIDOL (ISOVUE-300) INJECTION 61% COMPARISON:  09/11/2015 FINDINGS: Lower chest and abdominal wall: There is a chronic large hiatal hernia with intrathoracic stomach. The GE junction is above the hiatus with chronic organoaxial volvulus.  There is extraluminal gas, fat inflammation, and fluid around the stomach (which is nearly entirely visualized on the delayed phase). The stomach is not overdistended to suggest obstruction. No necrotic segment of bowel or pneumatosis is seen. Gastric vessels normally opacify within the hernia sac. Umbilical hernia containing nonobstructed bowel. Hepatobiliary: No focal liver abnormality.Cholelithiasis. No evidence of biliary obstruction or inflammation. Pancreas: Unremarkable. Spleen: Unremarkable. Adrenals/Urinary Tract: Negative adrenals. No hydronephrosis or stone. Unremarkable bladder. Stomach/Bowel: Bowel findings described above. Colonic diverticulosis. Reproductive:Fiducial markers in the central right aspect of the prostate gland which has a stable CT appearance. No pelvic adenopathy. Vascular/Lymphatic: Extensive atherosclerotic calcification. No acute vascular abnormality. No mass or adenopathy. Musculoskeletal: Advanced lumbar facet arthropathy with L4-5 and L5-S1 anterolisthesis. Multilevel disc degeneration and spondylosis. Critical Value/emergent results were called by telephone at the time of interpretation on 04/04/2016 at 10:03 pm to Dr. Virgel Manifold , who verbally acknowledged these results. IMPRESSION: 1. Bowel perforation, presumably gastric, with extraluminal gas, fluid, and inflammation in a hiatal hernia sac. There is chronic intrathoracic stomach herniation with organoaxial volvulus. Despite the volvulus, the stomach is not obstructed or distended. 2. Bowel containing umbilical hernia without related obstruction. 3. Cholelithiasis.  Electronically Signed   By: Monte Fantasia M.D.   On: 04/04/2016 22:06   Dg Chest Port 1 View  Result Date: 04/23/2016 CLINICAL DATA:  82 year old male with cough and hypotension EXAM: PORTABLE CHEST 1 VIEW COMPARISON:  Chest radiograph dated 04/08/2016 FINDINGS: Single portable view of chest demonstrates emphysematous changes of the lungs. There is mild eventration of the left hemidiaphragm. No focal consolidation, pleural effusion, or pneumothorax. Stable cardiac silhouette. Osteopenia with degenerative changes of the shoulders. No acute osseous pathology. IMPRESSION: No active disease. Electronically Signed   By: Anner Crete M.D.   On: 04/23/2016 04:56   Dg Chest Port 1 View  Result Date: 04/08/2016 CLINICAL DATA:  Increased wheezing and shortness of breath EXAM: PORTABLE CHEST 1 VIEW COMPARISON:  04/06/2016 FINDINGS: Chronic cardiopericardial enlargement. Stable positioning of central line with tip over the SVC. Improved lung volumes despite interval extubation. Persistent bibasilar opacity that is likely atelectasis. The left diaphragm is now visible. No effusion or pneumothorax. IMPRESSION: Improved lung volumes and decreased atelectasis after extubation. Electronically Signed   By: Monte Fantasia M.D.   On: 04/08/2016 05:52   Dg Chest Port 1 View  Result Date: 04/06/2016 CLINICAL DATA:  Intubation. EXAM: PORTABLE CHEST 1 VIEW COMPARISON:  04/05/2016. FINDINGS: Endotracheal tube 1.8 cm above the carina. Proximal repositioning of approximately 1 -2 cm should be considered. Right IJ line stable position. Heart size stable. Low lung volumes with persist bibasilar atelectasis and/or infiltrates. Small bilateral pleural effusions cannot be excluded. No pneumothorax. IMPRESSION: 1. Endotracheal tube 1.8 cm above the carina. Proximal repositioning of approximately 1-2 cm should be considered . Right IJ line in stable position. 2. Low lung volumes with persistent bibasilar atelectasis and/or  infiltrates. Small bilateral pleural effusions cannot be excluded . Electronically Signed   By: Marcello Moores  Register   On: 04/06/2016 06:46   Dg Chest Port 1 View  Result Date: 04/05/2016 CLINICAL DATA:  Postop.  Endotracheal tube. EXAM: PORTABLE CHEST 1 VIEW COMPARISON:  12/31/2015 FINDINGS: Low volume chest with streaky opacity greater on the left. Interstitial coarsening. Stable heart size. Central line on the right with tip at the SVC level. No pneumothorax. IMPRESSION: 1. Endotracheal tube and right IJ line in good position. No pneumothorax. 2. Postoperative atelectasis and pulmonary venous congestion. Electronically Signed   By:  Monte Fantasia M.D.   On: 04/05/2016 03:26   Dg Swallowing Func-speech Pathology  Result Date: 04/13/2016 Objective Swallowing Evaluation: Type of Study: MBS-Modified Barium Swallow Study Patient Details Name: GIULIAN ASSI MRN: [id number] Date of Birth: Nov 14, 1930 Today's Date: 04/13/2016 Time: SLP Start Time (ACUTE ONLY): 1430-SLP Stop Time (ACUTE ONLY): 1500 SLP Time Calculation (min) (ACUTE ONLY): 30 min Past Medical History: Past Medical History: Diagnosis Date . Anemia  . Aortic insufficiency 08/11/2015 . Basal cell cancer  . Brain tumor ([place])  . GERD (gastroesophageal reflux disease)  . Herniated disc   lumbar spine . Hiatal hernia  . History of radiation therapy   back approximately 2010 prostate radiation under care of Dr. Valere Dross  . Hyperlipidemia  . Hypertension  . Meningioma ([place])   brain . Nocturia  . Occult blood in stools  . OSA (obstructive sleep apnea) 10/28/2014  Moderate with AHI 21/hr . Prostate cancer ([place])  . Restless leg  . Seizures ([place])  Past Surgical History: Past Surgical History: Procedure Laterality Date . APPLICATION OF CRANIAL NAVIGATION N/A [id number]  Procedure: APPLICATION OF CRANIAL NAVIGATION;  Surgeon: Consuella Lose, MD;  Location: [place] [facility];  Service: Neurosurgery;  Laterality: N/A;  APPLICATION OF CRANIAL NAVIGATION . Bilateral cateract surgery    . COLONOSCOPY    colon polyp removed . CRANIOTOMY Right 09/27/2013  Procedure: RIGHT FRONTAL CRANIOTOMY FOR TUMOR RESECTION ;  Surgeon: Consuella Lose, MD;  Location: [place] [facility];  Service: Neurosurgery;  Laterality: Right; . CRANIOTOMY Right 02/17/2015  Procedure: Right frontal parietal craniotomy for resection of meningioma with brainlab;  Surgeon: Consuella Lose, MD;  Location: [place] [facility];  Service: Neurosurgery;  Laterality: Right;  Right frontal parietal craniotomy for resection of meningioma with brainlab . JOINT REPLACEMENT    right knee replacement . LAPAROTOMY N/A 04/04/2016  Procedure: EXPLORATORY LAPAROTOMY, REPAIR OF GASTRIC PERFORATION WITH OMENTAL PATCH, PLACEMENT OF GASTROSTOMY TUBE;  Surgeon: Autumn Messing III, MD;  Location: [facility];  Service: General;  Laterality: N/A; . PROSTATE BIOPSY   . TONSILLECTOMY    Agae 6-7 . VASECTOMY   HPI: 82 yo male adm to [facility] with abdominal pain - pt is s/p surgery with PEG placed 7/17 with intubation a few days.  Per wife, pt has been in ICU and has not had po until yesterday.   Pt started po intake yesteday and was coughing with liquids, therefore diet was changed to nectar liquids and SLP ordered.   Diet was advanced to regular/thin today.  Pt PMH + for meningioma s/p XRT and surgery, anemia, seizures.  Pt CXR shows atelectasis.   Subjective: pt seen in radiology for MBS Assessment / Plan / Recommendation CHL IP CLINICAL IMPRESSIONS 04/13/2016 Therapy Diagnosis Mild oral phase dysphagia;Mild pharyngeal phase dysphagia Clinical Impression Pt presents with mild oropharyngeal dysphagia, characterized by poor bolus formation of puree and solid with posterior spillage to the vallecula, and right anterior leakage of liquids. Swallow reflex was delayed, with trigger at the vallecula on the initial swallow. Poor airway closure resulted in flash penetration of thin liquids during the initial swallow. Nectar thick liquid prevented penetration of the initial swallow,  however, Residue of thin and nectar thick liquid on tongue base and vallecula was noted to spill to the pyriform prior to trigger of (cued) dry swallow.  Small boluses and immediate second swallow prevented penetration of nectar thick liquids. Recommend dys 3 solids with chopped meats and nectar thick liquids, SMALL bites and sips at a SLOW rate, with 2  swallows per bite/sip. Meds one at a time with puree. RN was notified of results and recommendations, and safe swallow precautions were posted at [facility]. ST will follow for education and assessment of diet tolerance. Recommend repeat MBS prior to advancing liquids, given lack of reflexive response to penetration on this study. Impact on safety and function Risk for inadequate nutrition/hydration;Moderate aspiration risk;Mild aspiration risk   CHL IP TREATMENT RECOMMENDATION 04/13/2016 Treatment Recommendations Therapy as outlined in treatment plan below   Prognosis 04/13/2016 Prognosis for Safe Diet Advancement Guarded Barriers to Reach Goals Motivation CHL IP DIET RECOMMENDATION 04/13/2016 SLP Diet Recommendations Dysphagia 3 (Mech soft) solids;Nectar thick liquid Liquid Administration via No straw;Cup Medication Administration Whole meds with puree Compensations Slow rate;Small sips/bites;Clear throat intermittently;Other (Comment);Minimize environmental distractions;Multiple dry swallows after each bite/sip Postural Changes Seated upright at 90 degrees   CHL IP OTHER RECOMMENDATIONS 04/13/2016 Recommended Consults -- Oral Care Recommendations Oral care BID Other Recommendations Order thickener from pharmacy;Prohibited food (jello, ice cream, thin soups);Remove water pitcher   CHL IP FOLLOW UP RECOMMENDATIONS 04/12/2016 Follow up Recommendations To be determined   CHL IP FREQUENCY AND DURATION 04/13/2016 Speech Therapy Frequency (ACUTE ONLY) min 2x/week Treatment Duration 1 week    CHL IP ORAL PHASE 04/13/2016 Oral Phase Impaired Oral - Nectar Cup Right anterior bolus loss  Oral - Thin Cup Right anterior bolus loss Oral - Puree Reduced posterior propulsion;Delayed oral transit;Premature spillage Oral - Multi-Consistency Premature spillage;Delayed oral transit;Reduced posterior propulsion  CHL IP PHARYNGEAL PHASE 04/13/2016 Pharyngeal Phase Impaired Pharyngeal- Nectar Cup Delayed swallow initiation-vallecula;Reduced pharyngeal peristalsis;Reduced anterior laryngeal mobility;Reduced laryngeal elevation;Reduced airway/laryngeal closure;Reduced tongue base retraction;Penetration/Aspiration during swallow;Pharyngeal residue - valleculae;Delayed swallow initiation-pyriform sinuses Pharyngeal Material enters airway, remains ABOVE vocal cords then ejected out Pharyngeal- Thin Cup Delayed swallow initiation-vallecula;Delayed swallow initiation-pyriform sinuses;Reduced pharyngeal peristalsis;Reduced anterior laryngeal mobility;Reduced laryngeal elevation;Reduced airway/laryngeal closure;Pharyngeal residue - valleculae;Reduced tongue base retraction;Penetration/Aspiration during swallow Pharyngeal Material enters airway, remains ABOVE vocal cords then ejected out Pharyngeal- Puree Delayed swallow initiation-vallecula;Reduced tongue base retraction Pharyngeal- Multi-consistency Delayed swallow initiation-vallecula;Reduced tongue base retraction  CHL IP CERVICAL ESOPHAGEAL PHASE 04/13/2016 Cervical Esophageal Phase [facility] Celia B. Hoople, [facility], CCC-SLP [id number] Shonna Chock 04/13/2016, 3:19 PM               Lab Data:  CBC:  Recent Labs Lab 04/23/16 0421  04/24/16 0519 04/25/16 0410 04/26/16 0458 04/27/16 0058 04/29/16 0314  WBC 14.2*  < > 10.8* 9.0 9.1 8.8 9.6  NEUTROABS 11.6*  --   --   --   --   --   --   HGB 13.4  < > 11.1* 10.7* 11.1* 10.6* 12.0*  HCT 43.6  < > 36.7* 35.2* 36.3* 34.5* 38.8*  MCV 91.6  < > 92.0 91.0 90.8 91.5 91.5  PLT 277  < > 283 244 245 228 282  < > = values in this interval not displayed. Basic Metabolic Panel:  Recent Labs Lab 04/25/16 0410  04/26/16 0458 04/27/16 0425 04/28/16 0330 04/29/16 0314  NA 135 134* 133* 133* 133*  K 3.4* 3.5 3.5 3.8 4.2  CL 104 105 104 102 99*  CO2 24 22 22 23 26   GLUCOSE 86 91 82 92 91  BUN 11 7 6  <5* 7  CREATININE 0.92 0.90 0.91 0.80 0.90  CALCIUM 8.0* 8.0* 7.9* 8.2* 8.6*   GFR: Estimated Creatinine Clearance: 72.6 mL/min (by C-G formula based on SCr of 0.9 mg/dL). Liver Function Tests:  Recent Labs Lab 04/23/16 0421  AST 31  ALT  30  ALKPHOS 72  BILITOT 0.5  PROT 5.8*  ALBUMIN 2.4*   No results for input(s): LIPASE, AMYLASE in the last 168 hours. No results for input(s): AMMONIA in the last 168 hours. Coagulation Profile:  Recent Labs Lab 04/23/16 1337  INR 1.10   Cardiac Enzymes: No results for input(s): CKTOTAL, CKMB, CKMBINDEX, TROPONINI in the last 168 hours. BNP (last 3 results) No results for input(s): PROBNP in the last 8760 hours. HbA1C: No results for input(s): HGBA1C in the last 72 hours. CBG:  Recent Labs Lab 04/28/16 1215  GLUCAP 154*   Lipid Profile: No results for input(s): CHOL, HDL, LDLCALC, TRIG, CHOLHDL, LDLDIRECT in the last 72 hours. Thyroid Function Tests: No results for input(s): TSH, T4TOTAL, FREET4, T3FREE, THYROIDAB in the last 72 hours. Anemia Panel: No results for input(s): VITAMINB12, FOLATE, FERRITIN, TIBC, IRON, RETICCTPCT in the last 72 hours. Urine analysis:    Component Value Date/Time   COLORURINE YELLOW 04/23/2016 1221   APPEARANCEUR TURBID (A) 04/23/2016 1221   LABSPEC 1.045 (H) 04/23/2016 1221   PHURINE 7.0 04/23/2016 1221   GLUCOSEU NEGATIVE 04/23/2016 1221   HGBUR NEGATIVE 04/23/2016 1221   BILIRUBINUR NEGATIVE 04/23/2016 1221   KETONESUR NEGATIVE 04/23/2016 1221   PROTEINUR 30 (A) 04/23/2016 1221   UROBILINOGEN 0.2 07/31/2014 1941   NITRITE NEGATIVE 04/23/2016 1221   LEUKOCYTESUR NEGATIVE 04/23/2016 1221     Ismelda Weatherman M.D. [facility] 04/29/2016, 1:35 PM  Pager: [phone]  After 7pm go to  [email or web address] - password [email or web address]  Call night coverage person covering after 7pm

## 2016-04-29 NOTE — Care Management Important Message (Signed)
Important Message  Patient Details  Name: [name] MRN: [id number] Date of Birth: 06/21/1931   Medicare Important Message Given:  Yes    Hezikiah Retzloff [place] 04/29/2016, 10:50 AM

## 2016-04-29 NOTE — Care Management Important Message (Signed)
Important Message  Patient Details  Name: [name] MRN: [id number] Date of Birth: 1931/04/16   Medicare Important Message Given:  Yes    [name], [name] 04/29/2016, 1:32 PM

## 2016-04-29 NOTE — Clinical Social Work Note (Signed)
Clinical Social Worker continuing to follow patient and family for support and discharge planning needs.  Patient not medically ready due to soft blood pressure today, however potentially ready for placement tomorrow.  CSW updated facility who is agreeable with patient return over the weekend pending medical stability and insurance authorization.  CSW has initiated insurance authorization with [employer] and awaiting authorization number.  CSW remains available for support and to facilitate patient discharge needs once medically ready.  Barbette Or, [place]

## 2016-04-29 NOTE — Progress Notes (Signed)
Physical Therapy Treatment Patient Details Name: [name] MRN: [id number] DOB: [birth year] Today's Date: 04/29/2016    History of Present Illness 82 yo male admitted 04/04/16 with abdominal pain. S/P EXPLORATORY LAPAROTOMY, REPAIR OF GASTRIC PERFORATION WITH OMENTAL PATCH, PLACEMENT OF GASTROSTOMY TUBE on 04/05/18    PT Comments    Pt is up to side of bed with nearly total assist and expressing worry about BP.  Nursing and PT checked him supine and was 87/56 then sitting 88/58 on R arm.  He is feeling weak in that position and did not attempt to get him to the chair due to lower values and his concerns.  Talked with family who all state he was walking until end of July at home.  Pt will continue PT in hospital and then to SNF when medically ready, and in hospital will focus on upright postures and being up to chair as he is able.    Follow Up Recommendations  SNF     Equipment Recommendations  None recommended by PT    Recommendations for Other Services Rehab consult     Precautions / Restrictions Precautions Precautions: Fall (telemetry) Precaution Comments: multiple lines and leg weakness. Restrictions Weight Bearing Restrictions: No    Mobility  Bed Mobility Overal bed mobility: Needs Assistance Bed Mobility: Sidelying to Sit;Sit to Supine;Supine to Sit;Sit to Sidelying Rolling: Mod assist Sidelying to sit: Max assist Supine to sit: Max assist Sit to supine: Max assist Sit to sidelying: Max assist General bed mobility comments: Pt  is more weak than last PT visit but on heavier doses of lasix and BP is lower  Transfers                 General transfer comment: unable to attempt due to weakness  Ambulation/Gait             General Gait Details: unable   Stairs            Wheelchair Mobility    Modified Rankin (Stroke Patients Only)       Balance Overall balance assessment: Needs assistance Sitting-balance support: Feet supported Sitting  balance-Leahy Scale: Poor Sitting balance - Comments: pt is strongly pushing with LUE and PT cannot steady well as he leans strongly to R and backward                            Cognition Arousal/Alertness: Awake/alert Behavior During Therapy: WFL for tasks assessed/performed Overall Cognitive Status: Impaired/Different from baseline Area of Impairment: Memory;Orientation;Following commands;Safety/judgement;Awareness;Problem solving Orientation Level: Situation;Time   Memory: Decreased recall of precautions;Decreased short-term memory Following Commands: Follows one step commands inconsistently Safety/Judgement: Decreased awareness of safety;Decreased awareness of deficits Awareness: Intellectual Problem Solving: Slow processing;Decreased initiation;Difficulty sequencing;Requires verbal cues;Requires tactile cues General Comments: Pt was reporting to PT he stood up with last PT when he was actually very weak and could not sit to support himself    Exercises      General Comments General comments (skin integrity, edema, etc.): Wife and pt are critical of his lack of mobility and report he did have a great deal of BP drops at SNF previously.  He is unsafe to do more than sit today, could not get him up to stand due to his strong leans to the side and back      Pertinent Vitals/Pain Pain Assessment: Faces Faces Pain Scale: Hurts little more Pain Location: wound on his back Pain Descriptors / Indicators:  Aching;Sore Pain Intervention(s): Monitored during session;Repositioned    Home Living                      Prior Function            PT Goals (current goals can now be found in the care plan section) Acute Rehab PT Goals Patient Stated Goal: to get stronger Progress towards PT goals: Progressing toward goals    Frequency  Min 2X/week    PT Plan Current plan remains appropriate    Co-evaluation             End of Session Equipment Utilized  During Treatment: Gait belt Activity Tolerance: Patient limited by fatigue;Patient limited by lethargy Patient left: in bed;with call bell/phone within reach;with bed alarm set;with family/visitor present     Time: [id number] PT Time Calculation (min) (ACUTE ONLY): 24 min  Charges:  $Therapeutic Activity: 23-37 mins                    G CodesRamond Dial 05-01-16, 12:18 PM    Mee Hives, PT MS Acute Rehab Dept. Number: [place] [facility] [place]

## 2016-04-30 MED ORDER — RIVAROXABAN 15 MG PO TABS: 15.0000 mg | ORAL_TABLET | Freq: Two times a day (BID) | ORAL | Status: DC

## 2016-04-30 MED ORDER — SENNOSIDES-DOCUSATE SODIUM 8.6-50 MG PO TABS: 1.0000 | ORAL_TABLET | Freq: Two times a day (BID) | ORAL | Status: DC

## 2016-04-30 MED ORDER — RIVAROXABAN 20 MG PO TABS: 20.0000 mg | ORAL_TABLET | Freq: Every day | ORAL | Status: AC

## 2016-04-30 MED ORDER — POLYETHYLENE GLYCOL 3350 17 G PO PACK: 17.0000 g | PACK | Freq: Every day | ORAL | 0 refills | Status: AC

## 2016-04-30 MED ORDER — AMOXICILLIN-POT CLAVULANATE 875-125 MG PO TABS: 1.0000 | ORAL_TABLET | Freq: Two times a day (BID) | ORAL | Status: DC

## 2016-04-30 MED ORDER — GUAIFENESIN 100 MG/5ML PO SOLN: 5.0000 mL | ORAL | 0 refills | Status: AC | PRN

## 2016-04-30 MED ORDER — LEVETIRACETAM 500 MG PO TABS: ORAL_TABLET | ORAL | Status: DC

## 2016-04-30 NOTE — Clinical Social Work Placement (Signed)
   CLINICAL SOCIAL WORK PLACEMENT  NOTE  Date:  04/30/2016  Patient Details  Name: [name] MRN: [id number] Date of Birth: 1931/05/08  Clinical Social Work is seeking post-discharge placement for this patient at the [place] level of care (*CSW will initial, date and re-position this form in  chart as items are completed):  Yes   Patient/family provided with [place] Work Department's list of facilities offering this level of care within the geographic area requested by the patient (or if unable, by the patient's family).  Yes   Patient/family informed of their freedom to choose among providers that offer the needed level of care, that participate in Medicare, Medicaid or managed care program needed by the patient, have an available bed and are willing to accept the patient.  Yes   Patient/family informed of [place]'s ownership interest in [facility] and [facility], as well as of the fact that they are under no obligation to receive care at these facilities.  PASRR submitted to EDS on       PASRR number received on       Existing PASRR number confirmed on 04/30/16     FL2 transmitted to all facilities in geographic area requested by pt/family on 04/30/16     FL2 transmitted to all facilities within larger geographic area on       Patient informed that his/her managed care company has contracts with or will negotiate with certain facilities, including the following:        Yes   Patient/family informed of bed offers received.  Patient chooses bed at [facility]     Physician recommends and patient chooses bed at      Patient to be transferred to [facility] on 04/30/16.  Patient to be transferred to facility by Ambulance     Patient family notified on 04/30/16 of transfer.  Name of family member notified:  Mardene Celeste     PHYSICIAN Please prepare priority discharge summary, including medications, Please prepare  prescriptions, Please sign FL2     Additional Comment:  Per MD patient ready for DC to [facility]. RN, patient, patient's family, and facility notified of DC. RN given number for report. DC packet on chart. Ambulance transport requested for patient for 2:00PM pickup. Facility aware that the patient was sent to the hospital with [facility]'s wound vac, this is now with the wife. CSW and RN informed the wife that she would need to take the wound vac to [facility] at time of discharge so that the nurses at the facility can but wound vac on the patient upon arrival. CSW signing off.   _______________________________________________ Rigoberto Noel, LCSW 04/30/2016, 2:09 PM

## 2016-04-30 NOTE — Progress Notes (Signed)
Wound vac off. IVs removed. Report will be called to [facility] place. Pt is ready for transport

## 2016-04-30 NOTE — Progress Notes (Signed)
Delayed note entry  Pt feels better this pm than am No abd pain Eating better Reports not much from old g tube site  Alert, nontoxic BP better this pm  Obese, soft, wound vac intact.  Old LUQ g site - scant drainage on bandage, surrounding skin MUCH improved.   Dry gauze and paper to old g tube site daily and as needed Stable for tx from abd standpoint pls call with questions  Leighton Ruff. Redmond Pulling, MD, FACS General, Bariatric, & Minimally Invasive Surgery [facility], [place]

## 2016-04-30 NOTE — Progress Notes (Signed)
RN to remove our facility wound vac, dress wound with a saline dressing. Pt wife will deliver pt wound vac back to [facility] once he arrives.

## 2016-04-30 NOTE — Discharge Summary (Signed)
Physician Discharge Summary  [name] [id number] DOB: 04/02/1931 DOA: 04/23/2016  PCP:  Melinda Crutch, MD  Admit date: 04/23/2016 Discharge date: 04/30/2016  Time spent: 45 minutes  Recommendations for Outpatient Follow-up:  1. Dr.Paul Toth on 8/23 at 11am 2. PCP Dr.[name] in 1 week, stopped flomax due to Hypotension, can resume this as BP improves, wean off decadron in 1 week slowly 3. 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, not intractable, without status epilepticus (HCC)   Lactic acidosis   Leukocytosis   Nausea   Skin irritation   History of recent steroid use   Pulmonary embolus [facility])   Discharge Condition: stable  Diet recommendation: heart healthy  Filed Weights   04/28/16 0416 04/29/16 0427 04/30/16 0440  Weight: 85.3 kg (188 lb 1.6 oz) 82.5 kg (181 lb 14.4 oz) 83.1 kg (183 lb 1.6 oz)    History of present illness:  [name] a 82 y.o.malewith medical history significant for seizures, prostate,hypertension, hyperlipidemia, GERD, aortic insufficiency, prostate cancer status post radiation therapy 2010,perforated gastric ulcer status post surgical repair 1 week ago presents to the emergency Department chief complaint of emesis generalized weakness hypotension. Initial evaluation reveals elevated lactic acid leukocytosis CT of the abdomen concerning for paraesophageal fluid collection and pulmonary emboli. He states he was doing well at [facility] place after his surgery to weeks  On the day of admission, he had generalized weakness and dizziness and has had difficulty bearing weight but is undergoing physical therapy for  that.  Hospital Course:  Nausea and vomiting, recent perforated gastric ulcer status post repair on 7/17, para esophageal fluid collection - he was discharged from [facility] on 7/27 after repair of gastric perf with omental patch, G tube placement and repair of umbilical hernia. -General surgery consulted, felt to have a leaking G tube,  G tube removed at bedside on 8/7  - Wound VAC placed at the site -placed on empiric Iv Vanc/Zosyn and then changed to Oral augmentin 2days ago, continue this for 62more days - per [facility] : continue the wound VAC until he follows up with Dr. Marlou Starks. The G-tube site can be cleaned with soap and water and redress daily -improved and stable, tolerating diet PO  Bilateral PE with right heart strain and DVT - detected on admission, started on IV heparin initially, d/w with wife regarding DOACs she was agreeable to this, started Xarelto per pharmacy 8/9 -stable  Acute CHF/diastolic-iatrogenic -stopped IVF and IV lasix x2 doses yesterday -Bp soft now, diuretics now on hold  HTN -soft BPs this admission and hence stopped ARB, also held his FLomax, this an be resumed when BPs better and consistently stable  History of seizures/epileptic syndrome, meningioma: No recent seizures. Medications include Keppra and lamictal.At Baseline, Pt walks with a walker since surgery unable to ambulate -Wife states he's had 2surgery and radiation in the past. -Continue home meds   Anemia. Chart review indicates history of iron deficiency anemia. -Hemoglobin stable  Steroid use. He was placed on steroid taper by his PCP last month after a fall. He got down to 0.5 daily. This was increased since secondary to recent hospitalization. -cut down decadron to 1mg , then taper  off over 1 week  Pulm nodule -needs FU  Consultations:  [facility] Dr.Wilson  Discharge Exam: Vitals:   04/29/16 2346 04/30/16 0409  BP: 101/72 113/70  Pulse: 88 87  Resp: 19 20  Temp: 98.7 F (37.1 C) 98.1 F  (36.7 C)    General: AAOx3 Cardiovascular: S1S2/RRR Respiratory: CTAB  Discharge Instructions   Discharge Instructions    Diet - low sodium heart healthy    Complete by:  As directed   Increase activity slowly    Complete by:  As directed     Current Discharge Medication List    START taking these medications   Details  amoxicillin-clavulanate (AUGMENTIN) 875-125 MG tablet Take 1 tablet by mouth every 12 (twelve) hours. For 3days    guaiFENesin (ROBITUSSIN) 100 MG/5ML SOLN Take 5 mLs (100 mg total) by mouth every 4 (four) hours as needed for cough or to loosen phlegm. Refills: 0    polyethylene glycol (MIRALAX / GLYCOLAX) packet Take 17 g by mouth daily. Qty: 14 each, Refills: 0    !! Rivaroxaban (XARELTO) 15 MG TABS tablet Take 1 tablet (15 mg total) by mouth 2 (two) times daily with a meal.    !! rivaroxaban (XARELTO) 20 MG TABS tablet Take 1 tablet (20 mg total) by mouth daily with supper.    senna-docusate (SENOKOT-S) 8.6-50 MG tablet Take 1 tablet by mouth 2 (two) times daily.     !! - Potential duplicate medications found. Please discuss with provider.    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 Qty: 30 tablet, Refills: 0    albuterol (PROVENTIL) (2.5 MG/3ML) 0.083% nebulizer solution Take 3 mLs (2.5 mg total) by nebulization every 4 (four) hours as needed for wheezing or shortness of breath. Qty: 75 mL, Refills: 12    b complex vitamins tablet Take 1 tablet by mouth daily.    dexamethasone (DECADRON) 0.5 MG tablet Take 0.5 mg by mouth  daily. Stop date 05/13/16    famotidine (PEPCID) 20 MG tablet Take 1 tablet (20 mg total) by mouth 2 (two) times daily.    ferrous sulfate 325 (65 FE) MG tablet Take 1,300 mg by mouth every morning.     lamoTRIgine (LAMICTAL) 100 MG tablet Take 1 tablet (100 mg total) by mouth 2 (two) times daily. Qty: 60 tablet, Refills: 0    multivitamin-lutein (OCUVITE-LUTEIN) CAPS capsule Take 1 capsule by mouth daily.   Associated Diagnoses: Iron deficiency anemia due to chronic blood loss    nystatin cream (MYCOSTATIN) Apply 1 application topically 2 (two) times daily.    ondansetron (ZOFRAN-ODT) 4 MG disintegrating tablet Take 1 tablet (4 mg total) by mouth every 6 (six) hours as needed for nausea. Qty: 20 tablet, Refills: 0    rOPINIRole (REQUIP) 3 MG tablet Take 1.5 mg by mouth 2 (two) times daily.    vitamin B-12 (CYANOCOBALAMIN) 100 MCG tablet Take 100 mcg by mouth daily.       STOP taking these medications     irbesartan (AVAPRO) 75 MG tablet      solifenacin (VESICARE) 5 MG tablet  tamsulosin (FLOMAX) 0.4 MG CAPS capsule        Allergies  Allergen Reactions  . Asa [Aspirin] Nausea Only  . Other Itching and Rash    Nuts  . Vicodin [Hydrocodone-Acetaminophen] Nausea Only   Follow-up Information    Merrie Roof, MD. Go on 05/11/2016.   Specialty:  General Surgery Why:  at 11:00 AM for follow-up with surgeon, please arrive 30 minutes early. Continue wound VAC dressings to open site. Clean gastrostomy site with soap and water daily and when necessary for excessive drainage.  Dry dressing with paper tape daily. Contact information: [address] [address] [phone]         Melinda Crutch, MD. Schedule an appointment as soon as possible for a visit in 1 week(s).   Specialty:  Family Medicine Contact information: [place] [address] [phone]            The results of significant diagnostics from this  hospitalization (including imaging, microbiology, ancillary and laboratory) are listed below for reference.    Significant Diagnostic Studies: Ct Head Wo Contrast  Result Date: 04/26/2016 CLINICAL DATA:  Left arm weakness noticed this morning while eating breakfast. EXAM: CT HEAD WITHOUT CONTRAST TECHNIQUE: Contiguous axial images were obtained from the base of the skull through the vertex without intravenous contrast. COMPARISON:  03/21/2016 brain MRI FINDINGS: Brain: There is known extra axial mass deep to the high right bone flap consistent with meningioma. Multiple areas of marginal nodular disease are poorly seen compared to previous MRI. There is known invasion of the superior sagittal sinus. White matter low-density without notable mass effect in the posterior right frontal, right parietal, and right occipital regions is stable from previous. No superimposed infarct, hemorrhage, or hydrocephalus. Left posterior subdural collection on the previous study is not visualized today. Vascular: Atherosclerotic calcification. Meningioma is known to invade and/or occlude the superior sagittal sinus. Skull: Stable appearance of 2 high right craniotomy sites Sinuses/Orbits: Bilateral cataract resection.  No acute finding. Other: None. IMPRESSION: 1. No acute finding when compared to 03/21/2016 MRI. 2. Multi focal meningioma along the posterior right cerebral convexity with stable vasogenic edema and/or white matter gliosis. Electronically Signed   By: Monte Fantasia M.D.   On: 04/26/2016 10:44   Ct Angio Chest Pe W Or Wo Contrast  Result Date: 04/23/2016 CLINICAL DATA:  Pulmonary emboli identified in the right lower lobe on CT abdomen performed earlier today. Patient is status post recent laparotomy with gastric perforation repair and placement of gastrostomy tube. EXAM: CT ANGIOGRAPHY CHEST WITH CONTRAST TECHNIQUE: Multidetector CT imaging of the chest was performed using the standard protocol during bolus  administration of intravenous contrast. Multiplanar CT image reconstructions and MIPs were obtained to evaluate the vascular anatomy. CONTRAST:  80 cc Isovue 370 COMPARISON:  CT abdomen from earlier today. FINDINGS: Mediastinum/Lymph Nodes: Moderate to large amount of pulmonary embolus is seen at the distal aspects of each main pulmonary artery extending into the central segmental pulmonary artery branches to each lobe bilaterally. Nearly occlusive thrombus is seen within the bilateral intralobar pulmonary artery branches extending into multiple segmental pulmonary artery branches the lower lobes bilaterally. There is occlusive pulmonary embolus within the proximal aspects of the lingular pulmonary artery branch. Thoracic aorta is normal in caliber. Scattered atherosclerotic changes noted along the walls of the thoracic aorta. Heart size is normal. No pericardial effusion. Coronary artery calcifications noted. No masses or enlarged lymph nodes seen within the mediastinum  or perihilar regions. As noted on the earlier CT abdomen, there is a circumscribed-appearing collection within the lower mediastinum, adjacent to the anterior margin of the lower esophagus, measuring 2.8 x 1.7 cm, suspicious for early developing abscess. Lungs/Pleura: Small left pleural effusion with adjacent atelectasis. Trace right pleural effusion with adjacent atelectasis. Small ill-defined ground-glass opacities within the upper lobes bilaterally, likely mild edema. Pulmonary nodule within the medial aspects of the right upper lobe measures 1.3 x 1.1 cm (series 6, image 36). Additional nodule within the lingula measures 6 mm (series 6, image 41). Upper abdomen: Small layering stones within the otherwise normal-appearing gallbladder. Small low-density collection adjacent to the lateral margin of the stomach cardia, as described on the earlier abdomen CT. Musculoskeletal: No acute or suspicious osseous finding. Mild degenerative spurring within  the thoracic spine. Mild compression deformity of an upper thoracic vertebral body appears stable. Superficial soft tissues are unremarkable. Review of the MIP images confirms the above findings. IMPRESSION: 1. Moderate to large amount of pulmonary emboli bilaterally. This includes occlusive pulmonary embolus within the lingular pulmonary artery branch and nearly occlusive pulmonary emboli within the distal aspects of each main pulmonary artery extending into multiple segmental lower lobe pulmonary artery branches bilaterally. There is CT evidence of right heart strain (with RV/LV Ratio greater than 1) consistent with at least submassive (intermediate risk) PE. The presence of right heart strain has been associated with an increased risk of morbidity and mortality. Please activate Code PE by paging [phone]. 2. Small left pleural effusion and trace right pleural effusion. 3. Pulmonary nodule within the right upper lobe measuring 1.3 x 1.1 cm. This is new compared to an older chest CT of 04/06/2009. Neoplastic pulmonary nodule is not excluded. Consider further characterization with PET-CT after current issues are resolved. 4. Circumscribed fluid density collection within the lower mediastinum, adjacent to the anterior margin of the distal esophagus, measuring 2.8 x 1.7 cm, better demonstrated on the CT abdomen performed earlier today. On the earlier CT abdomen, this had an appearance suspicious for early developing abscess. Additional possible abscess versus expected postoperative fluid collection was also appreciated adjacent to the stomach cardia. 5. Aortic atherosclerosis. These results were called by telephone at the time of interpretation on 04/23/2016 at 3:24 p.m. to Dr. Denton Brick, who verbally acknowledged these results. Electronically Signed   By: Franki Cabot M.D.   On: 04/23/2016 15:31   Ct Abdomen Pelvis W Contrast  Result Date: 04/23/2016 CLINICAL DATA:  Discharge from hospital on July 28th after  abdominal surgery. Now with sharp pains at surgical site. Hematemesis last night. Status post recent laparotomy with repair of gastric perforation and placement of gastrostomy tube. EXAM: CT ABDOMEN AND PELVIS WITH CONTRAST TECHNIQUE: Multidetector CT imaging of the abdomen and pelvis was performed using the standard protocol following bolus administration of intravenous contrast. CONTRAST:  193mL ISOVUE-300 IOPAMIDOL (ISOVUE-300) INJECTION 61% COMPARISON:  CT abdomen status 04/04/2016 09/11/2015. FINDINGS: Lower chest: Small left pleural effusion and mild bibasilar atelectasis. Multiple acute appearing pulmonary emboli are seen within the segmental pulmonary artery branches to the right lower lobe. Hepatobiliary: Small layering stones within the otherwise normal-appearing gallbladder. Liver appears normal. Pancreas: Partially infiltrated with fat but otherwise unremarkable. Spleen: Within normal limits in size and appearance. Adrenals/Urinary Tract: Adrenal glands appear normal. Kidneys appear normal without mass, stone or hydronephrosis. No ureteral or bladder calculi identified. Bladder appears normal. Stomach/Bowel: Small hiatal hernia, significantly improved in appearance compared to the recent CT of 04/04/2016. There is a circumscribed  fluid collection within the lower mediastinum, just to the left of the anterior margin of the lower esophagus, measuring 2.7 x 1.6 cm, suspicious for early developing abscess. Additional smaller less well circumscribed low-density collection is seen adjacent to the stomach cardia, measuring approximately 2.6 x 1.5 cm, possible additional early abscess formation. No dilated large or small bowel loops. Gastrostomy tube appears well positioned. Scattered diverticulosis within the descending and sigmoid colon without evidence of acute diverticulitis. Vascular/Lymphatic: Scattered atherosclerotic changes of the normal caliber abdominal aorta. No enlarged lymph nodes seen.  Reproductive: No mass or other significant abnormality. Other: No free intraperitoneal air. Musculoskeletal: Degenerative changes throughout the slightly scoliotic thoracolumbar spine. No acute or suspicious osseous finding. IMPRESSION: 1. Interval surgical changes of the stomach, compatible with the given history of recent repair of gastric perforation and placement of gastrostomy tube. Circumscribed fluid density collection within the lower mediastinum, chest to the left of the anterior margin of the lower esophagus, measuring 2.7 x 1.6 cm, possibly expected postoperative fluid collection but suspicious for early developing abscess collection. Associated perforation cannot be excluded, but no extraluminal air to confirm. 2. Poorly defined low-density collection adjacent to the stomach cardia, measuring approximately 2.6 x 1.5 cm, expected postsurgical fluid versus additional early developing abscess. 3. Acute appearing pulmonary emboli within segmental pulmonary artery branches to the right lower lobe. Would consider CT pulmonary angiogram for more complete characterization of the pulmonary arteries. 4. Gastrostomy tube appears well positioned. 5. No bowel obstruction.  No free intraperitoneal air. 6. Colonic diverticulosis without evidence of acute diverticulitis. 7. Cholelithiasis without evidence of acute cholecystitis. 8. Aortic atherosclerosis. 9. Small left pleural effusion. Critical Value/emergent results were called by telephone at the time of interpretation on 04/23/2016 at 12:22 pm to Dr. Johnney Killian, who verbally acknowledged these results. Electronically Signed   By: Franki Cabot M.D.   On: 04/23/2016 12:24   Ct Abdomen Pelvis W Contrast  Result Date: 04/04/2016 CLINICAL DATA:  Abdominal and back pain EXAM: CT ABDOMEN AND PELVIS WITH CONTRAST TECHNIQUE: Multidetector CT imaging of the abdomen and pelvis was performed using the standard protocol following bolus administration of intravenous contrast.  CONTRAST:  180mL ISOVUE-300 IOPAMIDOL (ISOVUE-300) INJECTION 61% COMPARISON:  09/11/2015 FINDINGS: Lower chest and abdominal wall: There is a chronic large hiatal hernia with intrathoracic stomach. The GE junction is above the hiatus with chronic organoaxial volvulus. There is extraluminal gas, fat inflammation, and fluid around the stomach (which is nearly entirely visualized on the delayed phase). The stomach is not overdistended to suggest obstruction. No necrotic segment of bowel or pneumatosis is seen. Gastric vessels normally opacify within the hernia sac. Umbilical hernia containing nonobstructed bowel. Hepatobiliary: No focal liver abnormality.Cholelithiasis. No evidence of biliary obstruction or inflammation. Pancreas: Unremarkable. Spleen: Unremarkable. Adrenals/Urinary Tract: Negative adrenals. No hydronephrosis or stone. Unremarkable bladder. Stomach/Bowel: Bowel findings described above. Colonic diverticulosis. Reproductive:Fiducial markers in the central right aspect of the prostate gland which has a stable CT appearance. No pelvic adenopathy. Vascular/Lymphatic: Extensive atherosclerotic calcification. No acute vascular abnormality. No mass or adenopathy. Musculoskeletal: Advanced lumbar facet arthropathy with L4-5 and L5-S1 anterolisthesis. Multilevel disc degeneration and spondylosis. Critical Value/emergent results were called by telephone at the time of interpretation on 04/04/2016 at 10:03 pm to Dr. Virgel Manifold , who verbally acknowledged these results. IMPRESSION: 1. Bowel perforation, presumably gastric, with extraluminal gas, fluid, and inflammation in a hiatal hernia sac. There is chronic intrathoracic stomach herniation with organoaxial volvulus. Despite the volvulus, the stomach is not obstructed or distended.  2. Bowel containing umbilical hernia without related obstruction. 3. Cholelithiasis. Electronically Signed   By: Monte Fantasia M.D.   On: 04/04/2016 22:06   Dg Chest Port 1  View  Result Date: 04/23/2016 CLINICAL DATA:  82 year old male with cough and hypotension EXAM: PORTABLE CHEST 1 VIEW COMPARISON:  Chest radiograph dated 04/08/2016 FINDINGS: Single portable view of chest demonstrates emphysematous changes of the lungs. There is mild eventration of the left hemidiaphragm. No focal consolidation, pleural effusion, or pneumothorax. Stable cardiac silhouette. Osteopenia with degenerative changes of the shoulders. No acute osseous pathology. IMPRESSION: No active disease. Electronically Signed   By: Anner Crete M.D.   On: 04/23/2016 04:56   Dg Chest Port 1 View  Result Date: 04/08/2016 CLINICAL DATA:  Increased wheezing and shortness of breath EXAM: PORTABLE CHEST 1 VIEW COMPARISON:  04/06/2016 FINDINGS: Chronic cardiopericardial enlargement. Stable positioning of central line with tip over the SVC. Improved lung volumes despite interval extubation. Persistent bibasilar opacity that is likely atelectasis. The left diaphragm is now visible. No effusion or pneumothorax. IMPRESSION: Improved lung volumes and decreased atelectasis after extubation. Electronically Signed   By: Monte Fantasia M.D.   On: 04/08/2016 05:52   Dg Chest Port 1 View  Result Date: 04/06/2016 CLINICAL DATA:  Intubation. EXAM: PORTABLE CHEST 1 VIEW COMPARISON:  04/05/2016. FINDINGS: Endotracheal tube 1.8 cm above the carina. Proximal repositioning of approximately 1 -2 cm should be considered. Right IJ line stable position. Heart size stable. Low lung volumes with persist bibasilar atelectasis and/or infiltrates. Small bilateral pleural effusions cannot be excluded. No pneumothorax. IMPRESSION: 1. Endotracheal tube 1.8 cm above the carina. Proximal repositioning of approximately 1-2 cm should be considered . Right IJ line in stable position. 2. Low lung volumes with persistent bibasilar atelectasis and/or infiltrates. Small bilateral pleural effusions cannot be excluded . Electronically Signed   By:  Marcello Moores  Register   On: 04/06/2016 06:46   Dg Chest Port 1 View  Result Date: 04/05/2016 CLINICAL DATA:  Postop.  Endotracheal tube. EXAM: PORTABLE CHEST 1 VIEW COMPARISON:  12/31/2015 FINDINGS: Low volume chest with streaky opacity greater on the left. Interstitial coarsening. Stable heart size. Central line on the right with tip at the SVC level. No pneumothorax. IMPRESSION: 1. Endotracheal tube and right IJ line in good position. No pneumothorax. 2. Postoperative atelectasis and pulmonary venous congestion. Electronically Signed   By: Monte Fantasia M.D.   On: 04/05/2016 03:26   Dg Swallowing Func-speech Pathology  Result Date: 04/13/2016 Objective Swallowing Evaluation: Type of Study: MBS-Modified Barium Swallow Study Patient Details Name: IMIR BUTZ MRN: [id number] Date of Birth: 08-20-1931 Today's Date: 04/13/2016 Time: SLP Start Time (ACUTE ONLY): 1430-SLP Stop Time (ACUTE ONLY): 1500 SLP Time Calculation (min) (ACUTE ONLY): 30 min Past Medical History: Past Medical History: Diagnosis Date . Anemia  . Aortic insufficiency 08/11/2015 . Basal cell cancer  . Brain tumor ([place])  . GERD (gastroesophageal reflux disease)  . Herniated disc   lumbar spine . Hiatal hernia  . History of radiation therapy   back approximately 2010 prostate radiation under care of Dr. Valere Dross  . Hyperlipidemia  . Hypertension  . Meningioma ([place])   brain . Nocturia  . Occult blood in stools  . OSA (obstructive sleep apnea) 10/28/2014  Moderate with AHI 21/hr . Prostate cancer ([place])  . Restless leg  . Seizures ([place])  Past Surgical History: Past Surgical History: Procedure Laterality Date . APPLICATION OF CRANIAL NAVIGATION N/A [id number]  Procedure: APPLICATION OF CRANIAL NAVIGATION;  Surgeon:  Consuella Lose, MD;  Location: [place] [facility];  Service: Neurosurgery;  Laterality: N/A;  APPLICATION OF CRANIAL NAVIGATION . Bilateral cateract surgery   . COLONOSCOPY    colon polyp removed . CRANIOTOMY Right 09/27/2013  Procedure: RIGHT FRONTAL  CRANIOTOMY FOR TUMOR RESECTION ;  Surgeon: Consuella Lose, MD;  Location: [place] [facility];  Service: Neurosurgery;  Laterality: Right; . CRANIOTOMY Right 02/17/2015  Procedure: Right frontal parietal craniotomy for resection of meningioma with brainlab;  Surgeon: Consuella Lose, MD;  Location: [place] [facility];  Service: Neurosurgery;  Laterality: Right;  Right frontal parietal craniotomy for resection of meningioma with brainlab . JOINT REPLACEMENT    right knee replacement . LAPAROTOMY N/A 04/04/2016  Procedure: EXPLORATORY LAPAROTOMY, REPAIR OF GASTRIC PERFORATION WITH OMENTAL PATCH, PLACEMENT OF GASTROSTOMY TUBE;  Surgeon: Autumn Messing III, MD;  Location: [facility];  Service: General;  Laterality: N/A; . PROSTATE BIOPSY   . TONSILLECTOMY    Agae 6-7 . VASECTOMY   HPI: 82 yo male adm to [facility] with abdominal pain - pt is s/p surgery with PEG placed 7/17 with intubation a few days.  Per wife, pt has been in ICU and has not had po until yesterday.   Pt started po intake yesteday and was coughing with liquids, therefore diet was changed to nectar liquids and SLP ordered.   Diet was advanced to regular/thin today.  Pt PMH + for meningioma s/p XRT and surgery, anemia, seizures.  Pt CXR shows atelectasis.   Subjective: pt seen in radiology for MBS Assessment / Plan / Recommendation CHL IP CLINICAL IMPRESSIONS 04/13/2016 Therapy Diagnosis Mild oral phase dysphagia;Mild pharyngeal phase dysphagia Clinical Impression Pt presents with mild oropharyngeal dysphagia, characterized by poor bolus formation of puree and solid with posterior spillage to the vallecula, and right anterior leakage of liquids. Swallow reflex was delayed, with trigger at the vallecula on the initial swallow. Poor airway closure resulted in flash penetration of thin liquids during the initial swallow. Nectar thick liquid prevented penetration of the initial swallow, however, Residue of thin and nectar thick liquid on tongue base and vallecula was noted to spill to  the pyriform prior to trigger of (cued) dry swallow.  Small boluses and immediate second swallow prevented penetration of nectar thick liquids. Recommend dys 3 solids with chopped meats and nectar thick liquids, SMALL bites and sips at a SLOW rate, with 2 swallows per bite/sip. Meds one at a time with puree. RN was notified of results and recommendations, and safe swallow precautions were posted at [facility]. ST will follow for education and assessment of diet tolerance. Recommend repeat MBS prior to advancing liquids, given lack of reflexive response to penetration on this study. Impact on safety and function Risk for inadequate nutrition/hydration;Moderate aspiration risk;Mild aspiration risk   CHL IP TREATMENT RECOMMENDATION 04/13/2016 Treatment Recommendations Therapy as outlined in treatment plan below   Prognosis 04/13/2016 Prognosis for Safe Diet Advancement Guarded Barriers to Reach Goals Motivation CHL IP DIET RECOMMENDATION 04/13/2016 SLP Diet Recommendations Dysphagia 3 (Mech soft) solids;Nectar thick liquid Liquid Administration via No straw;Cup Medication Administration Whole meds with puree Compensations Slow rate;Small sips/bites;Clear throat intermittently;Other (Comment);Minimize environmental distractions;Multiple dry swallows after each bite/sip Postural Changes Seated upright at 90 degrees   CHL IP OTHER RECOMMENDATIONS 04/13/2016 Recommended Consults -- Oral Care Recommendations Oral care BID Other Recommendations Order thickener from pharmacy;Prohibited food (jello, ice cream, thin soups);Remove water pitcher   CHL IP FOLLOW UP RECOMMENDATIONS 04/12/2016 Follow up Recommendations To be determined   CHL IP FREQUENCY AND  DURATION 04/13/2016 Speech Therapy Frequency (ACUTE ONLY) min 2x/week Treatment Duration 1 week    CHL IP ORAL PHASE 04/13/2016 Oral Phase Impaired Oral - Nectar Cup Right anterior bolus loss Oral - Thin Cup Right anterior bolus loss Oral - Puree Reduced posterior propulsion;Delayed oral  transit;Premature spillage Oral - Multi-Consistency Premature spillage;Delayed oral transit;Reduced posterior propulsion  CHL IP PHARYNGEAL PHASE 04/13/2016 Pharyngeal Phase Impaired Pharyngeal- Nectar Cup Delayed swallow initiation-vallecula;Reduced pharyngeal peristalsis;Reduced anterior laryngeal mobility;Reduced laryngeal elevation;Reduced airway/laryngeal closure;Reduced tongue base retraction;Penetration/Aspiration during swallow;Pharyngeal residue - valleculae;Delayed swallow initiation-pyriform sinuses Pharyngeal Material enters airway, remains ABOVE vocal cords then ejected out Pharyngeal- Thin Cup Delayed swallow initiation-vallecula;Delayed swallow initiation-pyriform sinuses;Reduced pharyngeal peristalsis;Reduced anterior laryngeal mobility;Reduced laryngeal elevation;Reduced airway/laryngeal closure;Pharyngeal residue - valleculae;Reduced tongue base retraction;Penetration/Aspiration during swallow Pharyngeal Material enters airway, remains ABOVE vocal cords then ejected out Pharyngeal- Puree Delayed swallow initiation-vallecula;Reduced tongue base retraction Pharyngeal- Multi-consistency Delayed swallow initiation-vallecula;Reduced tongue base retraction  CHL IP CERVICAL ESOPHAGEAL PHASE 04/13/2016 Cervical Esophageal Phase [facility] [place] [place] Shonna Chock 04/13/2016, 3:19 PM               Microbiology: Recent Results (from the past 240 hour(s))  Blood Culture (routine x 2)     Status: None   Collection Time: 04/23/16  4:21 AM  Result Value Ref Range Status   Specimen Description BLOOD RIGHT ANTECUBITAL  Final   Special Requests IN PEDIATRIC BOTTLE 4CC  Final   Culture NO GROWTH 5 DAYS  Final   Report Status 04/28/2016 FINAL  Final  Blood Culture (routine x 2)     Status: None   Collection Time: 04/23/16  4:35 AM  Result Value Ref Range Status   Specimen Description BLOOD LEFT ANTECUBITAL  Final   Special Requests IN PEDIATRIC BOTTLE 4CC  Final   Culture NO  GROWTH 5 DAYS  Final   Report Status 04/28/2016 FINAL  Final  Urine culture     Status: Abnormal   Collection Time: 04/23/16 12:21 PM  Result Value Ref Range Status   Specimen Description URINE, RANDOM  Final   Special Requests NONE  Final   Culture MULTIPLE SPECIES PRESENT, SUGGEST RECOLLECTION (A)  Final   Report Status 04/24/2016 FINAL  Final  MRSA PCR Screening     Status: None   Collection Time: 04/23/16  4:33 PM  Result Value Ref Range Status   MRSA by PCR NEGATIVE NEGATIVE Final    Comment:        The GeneXpert MRSA Assay (FDA approved for NASAL specimens only), is one component of a comprehensive MRSA colonization surveillance program. It is not intended to diagnose MRSA infection nor to guide or monitor treatment for MRSA infections.      Labs: Basic Metabolic Panel:  Recent Labs Lab 04/25/16 0410 04/26/16 0458 04/27/16 0425 04/28/16 0330 04/29/16 0314  NA 135 134* 133* 133* 133*  K 3.4* 3.5 3.5 3.8 4.2  CL 104 105 104 102 99*  CO2 24 22 22 23 26   GLUCOSE 86 91 82 92 91  BUN 11 7 6  <5* 7  CREATININE 0.92 0.90 0.91 0.80 0.90  CALCIUM 8.0* 8.0* 7.9* 8.2* 8.6*   Liver Function Tests: No results for input(s): AST, ALT, ALKPHOS, BILITOT, PROT, ALBUMIN in the last 168 hours. No results for input(s): LIPASE, AMYLASE in the last 168 hours. No results for input(s): AMMONIA in the last 168 hours. CBC:  Recent Labs Lab 04/24/16 0519 04/25/16 0410 04/26/16 [id number] 04/27/16 0058 04/29/16 [id number]  WBC 10.8* 9.0 9.1 8.8 9.6  HGB 11.1* 10.7* 11.1* 10.6* 12.0*  HCT 36.7* 35.2* 36.3* 34.5* 38.8*  MCV 92.0 91.0 90.8 91.5 91.5  PLT 283 244 245 228 282   Cardiac Enzymes: No results for input(s): CKTOTAL, CKMB, CKMBINDEX, TROPONINI in the last 168 hours. BNP: BNP (last 3 results) No results for input(s): BNP in the last 8760 hours.  ProBNP (last 3 results) No results for input(s): PROBNP in the last 8760 hours.  CBG:  Recent Labs Lab 04/28/16 1215  GLUCAP  154*       SignedDomenic Polite MD.  [facility] 04/30/2016, 10:02 AM

## 2016-04-30 NOTE — Progress Notes (Signed)
Report called to [facility]

## 2016-05-02 ENCOUNTER — Non-Acute Institutional Stay (SKILLED_NURSING_FACILITY): Payer: Medicare Other | Admitting: Internal Medicine

## 2016-05-02 ENCOUNTER — Encounter: Payer: Self-pay | Admitting: Internal Medicine

## 2016-05-02 DIAGNOSIS — K9423 Gastrostomy malfunction: Secondary | ICD-10-CM | POA: Diagnosis not present

## 2016-05-02 DIAGNOSIS — E46 Unspecified protein-calorie malnutrition: Secondary | ICD-10-CM

## 2016-05-02 DIAGNOSIS — I10 Essential (primary) hypertension: Secondary | ICD-10-CM | POA: Diagnosis not present

## 2016-05-02 DIAGNOSIS — R531 Weakness: Secondary | ICD-10-CM | POA: Diagnosis not present

## 2016-05-02 DIAGNOSIS — D320 Benign neoplasm of cerebral meninges: Secondary | ICD-10-CM

## 2016-05-02 DIAGNOSIS — R059 Cough, unspecified: Secondary | ICD-10-CM

## 2016-05-02 DIAGNOSIS — B3789 Other sites of candidiasis: Secondary | ICD-10-CM | POA: Diagnosis not present

## 2016-05-02 DIAGNOSIS — K228 Other specified diseases of esophagus: Secondary | ICD-10-CM

## 2016-05-02 DIAGNOSIS — K5900 Constipation, unspecified: Secondary | ICD-10-CM

## 2016-05-02 DIAGNOSIS — R05 Cough: Secondary | ICD-10-CM

## 2016-05-02 DIAGNOSIS — G40909 Epilepsy, unspecified, not intractable, without status epilepticus: Secondary | ICD-10-CM

## 2016-05-02 DIAGNOSIS — D62 Acute posthemorrhagic anemia: Secondary | ICD-10-CM

## 2016-05-02 DIAGNOSIS — I2609 Other pulmonary embolism with acute cor pulmonale: Secondary | ICD-10-CM | POA: Diagnosis not present

## 2016-05-02 DIAGNOSIS — K251 Acute gastric ulcer with perforation: Secondary | ICD-10-CM

## 2016-05-02 DIAGNOSIS — K2289 Other specified disease of esophagus: Secondary | ICD-10-CM

## 2016-05-02 DIAGNOSIS — G2581 Restless legs syndrome: Secondary | ICD-10-CM

## 2016-05-02 NOTE — Progress Notes (Signed)
LOCATION: [name]  PCP:  Melinda Crutch, MD   Code Status: Full Code  Goals of care: Advanced Directive information Advanced Directives 04/23/2016  Does patient have an advance directive? No  Would patient like information on creating an advanced directive? -  Pre-existing out of facility DNR order (yellow form or pink MOST form) -       Extended Emergency Contact Information Primary Emergency Contact: Styron,[name] Address: [address]          [place], [place] [address] [place] Phone: [phone] Mobile Phone: [phone] Relation: Spouse   Allergies  Allergen Reactions  . Asa [Aspirin] Nausea Only  . Other Itching and Rash    Nuts  . Vicodin [Hydrocodone-Acetaminophen] Nausea Only    Chief Complaint  Patient presents with  . Readmit To SNF    Readmission     HPI:  Patient is a 82 y.o. male seen today for short term rehabilitation post hospital re-admission from 04/23/16-04/30/16 with acute pulmonary embolism with right heart strain and DVT, paraesophageal fluid collection, G tube leakage and acute diastolic chf. He was started on vi heparin and later switched to po xarelto. He was seen by general surgery, started on empiric antibiotics and peg tube was removed and he had a wound vac placed. He required iv fluids initially with low BP and later required iv diuretics. Of note, patient recently had surgical repair of his perforated gastric ulcer and umbilical hernia and was in the hospital from 04/04/16-04/15/16. He is seen in his room today with his wife at bedside.    Review of Systems:  Constitutional: Negative for fever, chills, diaphoresis. Energy level is fair. HENT: Negative for headache, congestion, nasal discharge, dysphagia Eyes: Negative for blurred vision, double vision and discharge.  Respiratory: Negative for shortness of breath and wheezing. Positive for occasional cough with white phlegm.  Cardiovascular: Negative for chest pain,  palpitations, leg swelling.  Gastrointestinal: Negative for heartburn, nausea, vomiting, abdominal pain. Positive for poor appetite. Last good bowel movement was 4 days ago. Small bowel movement last night. Passing gas. Positive for some acid reflux. Genitourinary: Negative for dysuria and flank pain.  Musculoskeletal: Negative for back pain, fall in the facility.  Skin: Negative for itching, rash.  Neurological: Negative for dizziness. Psychiatric/Behavioral: Negative for depression.   Past Medical History:  Diagnosis Date  . Anemia   . Aortic insufficiency 08/11/2015  . Basal cell cancer   . Brain tumor ([facility])   . GERD (gastroesophageal reflux disease)   . Herniated disc    lumbar spine  . Hiatal hernia   . History of radiation therapy    back approximately 2010 prostate radiation under care of Dr. Valere Dross   . Hyperlipidemia   . Hypertension   . Meningioma ([place])    brain  . Nocturia   . Occult blood in stools   . OSA (obstructive sleep apnea) 10/28/2014   Moderate with AHI 21/hr  . Prostate cancer ([facility])   . Restless leg   . Seizures ([place])    Past Surgical History:  Procedure Laterality Date  . APPLICATION OF CRANIAL NAVIGATION N/A 02/17/2015   Procedure: APPLICATION OF CRANIAL NAVIGATION;  Surgeon: Consuella Lose, MD;  Location: [facility];  Service: Neurosurgery;  Laterality: N/A;  APPLICATION OF CRANIAL NAVIGATION  . Bilateral cateract surgery    . COLONOSCOPY     colon polyp removed  . CRANIOTOMY Right 09/27/2013   Procedure: RIGHT FRONTAL CRANIOTOMY FOR TUMOR RESECTION ;  Surgeon: Consuella Lose, MD;  Location: [facility];  Service: Neurosurgery;  Laterality: Right;  . CRANIOTOMY Right 02/17/2015   Procedure: Right frontal parietal craniotomy for resection of meningioma with brainlab;  Surgeon: Consuella Lose, MD;  Location: [facility];  Service: Neurosurgery;  Laterality: Right;  Right frontal parietal craniotomy for resection of meningioma with brainlab  .  JOINT REPLACEMENT     right knee replacement  . LAPAROTOMY N/A 04/04/2016   Procedure: EXPLORATORY LAPAROTOMY, REPAIR OF GASTRIC PERFORATION WITH OMENTAL PATCH, PLACEMENT OF GASTROSTOMY TUBE;  Surgeon: Autumn Messing III, MD;  Location: [facility];  Service: General;  Laterality: N/A;  . PROSTATE BIOPSY    . TONSILLECTOMY     Agae 6-7  . VASECTOMY     Social History:   reports that he has never smoked. He quit smokeless tobacco use about 42 years ago. His smokeless tobacco use included Chew. He reports that he does not drink alcohol or use drugs.  Family History  Problem Relation Age of Onset  . Parkinsonism Mother   . Dementia Father     Medications:   Medication List       Accurate as of 05/02/16  2:29 PM. Always use your most recent med list.          acetaminophen 325 MG tablet Commonly known as:  TYLENOL Take 2 tablets (650 mg total) by mouth every 4 (four) hours as needed.   albuterol (2.5 MG/3ML) 0.083% nebulizer solution Commonly known as:  PROVENTIL Take 3 mLs (2.5 mg total) by nebulization every 4 (four) hours as needed for wheezing or shortness of breath.   amoxicillin-clavulanate 875-125 MG tablet Commonly known as:  AUGMENTIN Take 1 tablet by mouth every 12 (twelve) hours. For 3days   b complex vitamins tablet Take 1 tablet by mouth daily.   dexamethasone 0.5 MG tablet Commonly known as:  DECADRON Take 0.5 mg by mouth daily. Stop date 05/13/16   famotidine 20 MG tablet Commonly known as:  PEPCID Take 1 tablet (20 mg total) by mouth 2 (two) times daily.   ferrous sulfate 325 (65 FE) MG tablet Take 1,300 mg by mouth every morning.   guaiFENesin 100 MG/5ML Soln Commonly known as:  ROBITUSSIN Take 5 mLs (100 mg total) by mouth every 4 (four) hours as needed for cough or to loosen phlegm.   lamoTRIgine 100 MG tablet Commonly known as:  LAMICTAL Take 1 tablet (100 mg total) by mouth 2 (two) times daily.   levETIRAcetam 500 MG tablet Commonly known as:   KEPPRA 750mg  BID   multivitamin-lutein Caps capsule Take 1 capsule by mouth daily.   nystatin cream Commonly known as:  MYCOSTATIN Apply 1 application topically 2 (two) times daily.   ondansetron 4 MG disintegrating tablet Commonly known as:  ZOFRAN-ODT Take 1 tablet (4 mg total) by mouth every 6 (six) hours as needed for nausea.   polyethylene glycol packet Commonly known as:  MIRALAX / GLYCOLAX Take 17 g by mouth daily.   Rivaroxaban 15 MG Tabs tablet Commonly known as:  XARELTO Take 1 tablet (15 mg total) by mouth 2 (two) times daily with a meal.   rivaroxaban 20 MG Tabs tablet Commonly known as:  XARELTO Take 1 tablet (20 mg total) by mouth daily with supper. Start taking on:  05/19/2016   rOPINIRole 3 MG tablet Commonly known as:  REQUIP Take 1.5 mg by mouth 2 (two) times daily.   senna-docusate 8.6-50 MG tablet Commonly known as:  Senokot-S  Take 1 tablet by mouth 2 (two) times daily.   vitamin B-12 100 MCG tablet Commonly known as:  CYANOCOBALAMIN Take 100 mcg by mouth daily.       Immunizations: Immunization History  Administered Date(s) Administered  . Influenza Split 06/19/2014  . PPD Test 04/15/2016     Physical Exam:  Vitals:   05/02/16 1409  BP: (!) 98/56  Pulse: 98  Resp: 20  Temp: 97.8 F (36.6 C)  TempSrc: Oral  SpO2: 97%  Weight: 183 lb 1.6 oz (83.1 kg)  Height: 5\' 6"  (1.676 m)   Body mass index is 29.55 kg/m.  General- elderly frail male, in no acute distress Head- normocephalic, atraumatic Nose- no nasal discharge Throat- moist mucus membrane Eyes- PERRLA, EOMI, no pallor, no icterus, no discharge Neck- no cervical lymphadenopathy Cardiovascular- normal s1,s2, no murmur Respiratory- bilateral clear to auscultation, no wheeze, no rhonchi, no crackles, no use of accessory muscles Abdomen- bowel sounds present, soft, non tender Musculoskeletal- able to move all 4 extremities, generalized weakness with LLE weakness most  prominent Neurological- alert and oriented to person, place and time Skin- warm and dry, abdominal wall incision and peg tube site removal with dressing in place. mild LLQ erythema noted, steristrip to left arm Psychiatry- normal mood and affect   Labs reviewed: Basic Metabolic Panel:  Recent Labs  04/05/16 0226  04/09/16 0410 04/10/16 0402 04/11/16 0310  04/27/16 0425 04/28/16 0330 04/29/16 0314  NA 135  < > 141 141 138  < > 133* 133* 133*  K 3.8  < > 2.8* 3.0* 3.6  < > 3.5 3.8 4.2  CL 104  < > 108 108 106  < > 104 102 99*  CO2 21*  < > 28 26 26   < > 22 23 26   GLUCOSE 163*  < > 82 94 88  < > 82 92 91  BUN 22*  < > 24* 24* 19  < > 6 <5* 7  CREATININE 1.02  < > 0.96 0.89 0.82  < > 0.91 0.80 0.90  CALCIUM 7.9*  < > 7.9* 7.9* 7.9*  < > 7.9* 8.2* 8.6*  MG 1.6*  --  2.2 2.2 2.2  --   --   --   --   PHOS 4.6  --   --   --   --   --   --   --   --   < > = values in this interval not displayed. Liver Function Tests:  Recent Labs  04/10/16 0402 04/11/16 0310 04/23/16 0421  AST 43* 41 31  ALT 35 46 30  ALKPHOS 56 57 72  BILITOT 1.3* 0.8 0.5  PROT 4.7* 5.1* 5.8*  ALBUMIN 2.1* 2.2* 2.4*    Recent Labs  04/04/16 1956  LIPASE 19   No results for input(s): AMMONIA in the last 8760 hours. CBC:  Recent Labs  03/28/16 1017  04/05/16 0226  04/23/16 0421  04/26/16 0458 04/27/16 0058 04/29/16 0314  WBC 8.1  < > 37.0*  < > 14.2*  < > 9.1 8.8 9.6  NEUTROABS 7.2*  --  35.2*  --  11.6*  --   --   --   --   HGB 10.9*  < > 14.0  < > 13.4  < > 11.1* 10.6* 12.0*  HCT 34.6*  < > 45.3  < > 43.6  < > 36.3* 34.5* 38.8*  MCV 85.0  < > 90.6  < > 91.6  < > 90.8  91.5 91.5  PLT 591*  < > 279  < > 277  < > 245 228 282  < > = values in this interval not displayed. Cardiac Enzymes:  Recent Labs  04/05/16 0300 04/05/16 0550  TROPONINI 0.04* 0.05*   BNP: Invalid input(s): POCBNP CBG:  Recent Labs  04/05/16 0348 04/28/16 1215  GLUCAP 165* 154*    Radiological Exams: Ct Head  Wo Contrast  Result Date: 04/26/2016 CLINICAL DATA:  Left arm weakness noticed this morning while eating breakfast. EXAM: CT HEAD WITHOUT CONTRAST TECHNIQUE: Contiguous axial images were obtained from the base of the skull through the vertex without intravenous contrast. COMPARISON:  03/21/2016 brain MRI FINDINGS: Brain: There is known extra axial mass deep to the high right bone flap consistent with meningioma. Multiple areas of marginal nodular disease are poorly seen compared to previous MRI. There is known invasion of the superior sagittal sinus. White matter low-density without notable mass effect in the posterior right frontal, right parietal, and right occipital regions is stable from previous. No superimposed infarct, hemorrhage, or hydrocephalus. Left posterior subdural collection on the previous study is not visualized today. Vascular: Atherosclerotic calcification. Meningioma is known to invade and/or occlude the superior sagittal sinus. Skull: Stable appearance of 2 high right craniotomy sites Sinuses/Orbits: Bilateral cataract resection.  No acute finding. Other: None. IMPRESSION: 1. No acute finding when compared to 03/21/2016 MRI. 2. Multi focal meningioma along the posterior right cerebral convexity with stable vasogenic edema and/or white matter gliosis. Electronically Signed   By: Monte Fantasia M.D.   On: 04/26/2016 10:44   Ct Angio Chest Pe W Or Wo Contrast  Result Date: 04/23/2016 CLINICAL DATA:  Pulmonary emboli identified in the right lower lobe on CT abdomen performed earlier today. Patient is status post recent laparotomy with gastric perforation repair and placement of gastrostomy tube. EXAM: CT ANGIOGRAPHY CHEST WITH CONTRAST TECHNIQUE: Multidetector CT imaging of the chest was performed using the standard protocol during bolus administration of intravenous contrast. Multiplanar CT image reconstructions and MIPs were obtained to evaluate the vascular anatomy. CONTRAST:  80 cc Isovue  370 COMPARISON:  CT abdomen from earlier today. FINDINGS: Mediastinum/Lymph Nodes: Moderate to large amount of pulmonary embolus is seen at the distal aspects of each main pulmonary artery extending into the central segmental pulmonary artery branches to each lobe bilaterally. Nearly occlusive thrombus is seen within the bilateral intralobar pulmonary artery branches extending into multiple segmental pulmonary artery branches the lower lobes bilaterally. There is occlusive pulmonary embolus within the proximal aspects of the lingular pulmonary artery branch. Thoracic aorta is normal in caliber. Scattered atherosclerotic changes noted along the walls of the thoracic aorta. Heart size is normal. No pericardial effusion. Coronary artery calcifications noted. No masses or enlarged lymph nodes seen within the mediastinum or perihilar regions. As noted on the earlier CT abdomen, there is a circumscribed-appearing collection within the lower mediastinum, adjacent to the anterior margin of the lower esophagus, measuring 2.8 x 1.7 cm, suspicious for early developing abscess. Lungs/Pleura: Small left pleural effusion with adjacent atelectasis. Trace right pleural effusion with adjacent atelectasis. Small ill-defined ground-glass opacities within the upper lobes bilaterally, likely mild edema. Pulmonary nodule within the medial aspects of the right upper lobe measures 1.3 x 1.1 cm (series 6, image 36). Additional nodule within the lingula measures 6 mm (series 6, image 41). Upper abdomen: Small layering stones within the otherwise normal-appearing gallbladder. Small low-density collection adjacent to the lateral margin of the stomach cardia, as described on  the earlier abdomen CT. Musculoskeletal: No acute or suspicious osseous finding. Mild degenerative spurring within the thoracic spine. Mild compression deformity of an upper thoracic vertebral body appears stable. Superficial soft tissues are unremarkable. Review of the MIP  images confirms the above findings. IMPRESSION: 1. Moderate to large amount of pulmonary emboli bilaterally. This includes occlusive pulmonary embolus within the lingular pulmonary artery branch and nearly occlusive pulmonary emboli within the distal aspects of each main pulmonary artery extending into multiple segmental lower lobe pulmonary artery branches bilaterally. There is CT evidence of right heart strain (with RV/LV Ratio greater than 1) consistent with at least submassive (intermediate risk) PE. The presence of right heart strain has been associated with an increased risk of morbidity and mortality. Please activate Code PE by paging [phone]. 2. Small left pleural effusion and trace right pleural effusion. 3. Pulmonary nodule within the right upper lobe measuring 1.3 x 1.1 cm. This is new compared to an older chest CT of 04/06/2009. Neoplastic pulmonary nodule is not excluded. Consider further characterization with PET-CT after current issues are resolved. 4. Circumscribed fluid density collection within the lower mediastinum, adjacent to the anterior margin of the distal esophagus, measuring 2.8 x 1.7 cm, better demonstrated on the CT abdomen performed earlier today. On the earlier CT abdomen, this had an appearance suspicious for early developing abscess. Additional possible abscess versus expected postoperative fluid collection was also appreciated adjacent to the stomach cardia. 5. Aortic atherosclerosis. These results were called by telephone at the time of interpretation on 04/23/2016 at 3:24 p.m. to Dr. Denton Brick, who verbally acknowledged these results. Electronically Signed   By: Franki Cabot M.D.   On: 04/23/2016 15:31   Ct Abdomen Pelvis W Contrast  Result Date: 04/23/2016 CLINICAL DATA:  Discharge from hospital on July 28th after abdominal surgery. Now with sharp pains at surgical site. Hematemesis last night. Status post recent laparotomy with repair of gastric perforation and placement of  gastrostomy tube. EXAM: CT ABDOMEN AND PELVIS WITH CONTRAST TECHNIQUE: Multidetector CT imaging of the abdomen and pelvis was performed using the standard protocol following bolus administration of intravenous contrast. CONTRAST:  187mL ISOVUE-300 IOPAMIDOL (ISOVUE-300) INJECTION 61% COMPARISON:  CT abdomen status 04/04/2016 09/11/2015. FINDINGS: Lower chest: Small left pleural effusion and mild bibasilar atelectasis. Multiple acute appearing pulmonary emboli are seen within the segmental pulmonary artery branches to the right lower lobe. Hepatobiliary: Small layering stones within the otherwise normal-appearing gallbladder. Liver appears normal. Pancreas: Partially infiltrated with fat but otherwise unremarkable. Spleen: Within normal limits in size and appearance. Adrenals/Urinary Tract: Adrenal glands appear normal. Kidneys appear normal without mass, stone or hydronephrosis. No ureteral or bladder calculi identified. Bladder appears normal. Stomach/Bowel: Small hiatal hernia, significantly improved in appearance compared to the recent CT of 04/04/2016. There is a circumscribed fluid collection within the lower mediastinum, just to the left of the anterior margin of the lower esophagus, measuring 2.7 x 1.6 cm, suspicious for early developing abscess. Additional smaller less well circumscribed low-density collection is seen adjacent to the stomach cardia, measuring approximately 2.6 x 1.5 cm, possible additional early abscess formation. No dilated large or small bowel loops. Gastrostomy tube appears well positioned. Scattered diverticulosis within the descending and sigmoid colon without evidence of acute diverticulitis. Vascular/Lymphatic: Scattered atherosclerotic changes of the normal caliber abdominal aorta. No enlarged lymph nodes seen. Reproductive: No mass or other significant abnormality. Other: No free intraperitoneal air. Musculoskeletal: Degenerative changes throughout the slightly scoliotic  thoracolumbar spine. No acute or suspicious osseous finding. IMPRESSION:  1. Interval surgical changes of the stomach, compatible with the given history of recent repair of gastric perforation and placement of gastrostomy tube. Circumscribed fluid density collection within the lower mediastinum, chest to the left of the anterior margin of the lower esophagus, measuring 2.7 x 1.6 cm, possibly expected postoperative fluid collection but suspicious for early developing abscess collection. Associated perforation cannot be excluded, but no extraluminal air to confirm. 2. Poorly defined low-density collection adjacent to the stomach cardia, measuring approximately 2.6 x 1.5 cm, expected postsurgical fluid versus additional early developing abscess. 3. Acute appearing pulmonary emboli within segmental pulmonary artery branches to the right lower lobe. Would consider CT pulmonary angiogram for more complete characterization of the pulmonary arteries. 4. Gastrostomy tube appears well positioned. 5. No bowel obstruction.  No free intraperitoneal air. 6. Colonic diverticulosis without evidence of acute diverticulitis. 7. Cholelithiasis without evidence of acute cholecystitis. 8. Aortic atherosclerosis. 9. Small left pleural effusion. Critical Value/emergent results were called by telephone at the time of interpretation on 04/23/2016 at 12:22 pm to Dr. Johnney Killian, who verbally acknowledged these results. Electronically Signed   By: Franki Cabot M.D.   On: 04/23/2016 12:24   Ct Abdomen Pelvis W Contrast  Result Date: 04/04/2016 CLINICAL DATA:  Abdominal and back pain EXAM: CT ABDOMEN AND PELVIS WITH CONTRAST TECHNIQUE: Multidetector CT imaging of the abdomen and pelvis was performed using the standard protocol following bolus administration of intravenous contrast. CONTRAST:  138mL ISOVUE-300 IOPAMIDOL (ISOVUE-300) INJECTION 61% COMPARISON:  09/11/2015 FINDINGS: Lower chest and abdominal wall: There is a chronic large hiatal  hernia with intrathoracic stomach. The GE junction is above the hiatus with chronic organoaxial volvulus. There is extraluminal gas, fat inflammation, and fluid around the stomach (which is nearly entirely visualized on the delayed phase). The stomach is not overdistended to suggest obstruction. No necrotic segment of bowel or pneumatosis is seen. Gastric vessels normally opacify within the hernia sac. Umbilical hernia containing nonobstructed bowel. Hepatobiliary: No focal liver abnormality.Cholelithiasis. No evidence of biliary obstruction or inflammation. Pancreas: Unremarkable. Spleen: Unremarkable. Adrenals/Urinary Tract: Negative adrenals. No hydronephrosis or stone. Unremarkable bladder. Stomach/Bowel: Bowel findings described above. Colonic diverticulosis. Reproductive:Fiducial markers in the central right aspect of the prostate gland which has a stable CT appearance. No pelvic adenopathy. Vascular/Lymphatic: Extensive atherosclerotic calcification. No acute vascular abnormality. No mass or adenopathy. Musculoskeletal: Advanced lumbar facet arthropathy with L4-5 and L5-S1 anterolisthesis. Multilevel disc degeneration and spondylosis. Critical Value/emergent results were called by telephone at the time of interpretation on 04/04/2016 at 10:03 pm to Dr. Virgel Manifold , who verbally acknowledged these results. IMPRESSION: 1. Bowel perforation, presumably gastric, with extraluminal gas, fluid, and inflammation in a hiatal hernia sac. There is chronic intrathoracic stomach herniation with organoaxial volvulus. Despite the volvulus, the stomach is not obstructed or distended. 2. Bowel containing umbilical hernia without related obstruction. 3. Cholelithiasis. Electronically Signed   By: Monte Fantasia M.D.   On: 04/04/2016 22:06   Dg Chest Port 1 View  Result Date: 04/23/2016 CLINICAL DATA:  82 year old male with cough and hypotension EXAM: PORTABLE CHEST 1 VIEW COMPARISON:  Chest radiograph dated 04/08/2016  FINDINGS: Single portable view of chest demonstrates emphysematous changes of the lungs. There is mild eventration of the left hemidiaphragm. No focal consolidation, pleural effusion, or pneumothorax. Stable cardiac silhouette. Osteopenia with degenerative changes of the shoulders. No acute osseous pathology. IMPRESSION: No active disease. Electronically Signed   By: Anner Crete M.D.   On: 04/23/2016 04:56   Dg Chest [facility]  Result Date: 04/08/2016 CLINICAL DATA:  Increased wheezing and shortness of breath EXAM: PORTABLE CHEST 1 VIEW COMPARISON:  04/06/2016 FINDINGS: Chronic cardiopericardial enlargement. Stable positioning of central line with tip over the SVC. Improved lung volumes despite interval extubation. Persistent bibasilar opacity that is likely atelectasis. The left diaphragm is now visible. No effusion or pneumothorax. IMPRESSION: Improved lung volumes and decreased atelectasis after extubation. Electronically Signed   By: Monte Fantasia M.D.   On: 04/08/2016 05:52   Dg Chest Port 1 View  Result Date: 04/06/2016 CLINICAL DATA:  Intubation. EXAM: PORTABLE CHEST 1 VIEW COMPARISON:  04/05/2016. FINDINGS: Endotracheal tube 1.8 cm above the carina. Proximal repositioning of approximately 1 -2 cm should be considered. Right IJ line stable position. Heart size stable. Low lung volumes with persist bibasilar atelectasis and/or infiltrates. Small bilateral pleural effusions cannot be excluded. No pneumothorax. IMPRESSION: 1. Endotracheal tube 1.8 cm above the carina. Proximal repositioning of approximately 1-2 cm should be considered . Right IJ line in stable position. 2. Low lung volumes with persistent bibasilar atelectasis and/or infiltrates. Small bilateral pleural effusions cannot be excluded . Electronically Signed   By: Marcello Moores  Register   On: 04/06/2016 06:46   Dg Chest Port 1 View  Result Date: 04/05/2016 CLINICAL DATA:  Postop.  Endotracheal tube. EXAM: PORTABLE CHEST 1 VIEW  COMPARISON:  12/31/2015 FINDINGS: Low volume chest with streaky opacity greater on the left. Interstitial coarsening. Stable heart size. Central line on the right with tip at the SVC level. No pneumothorax. IMPRESSION: 1. Endotracheal tube and right IJ line in good position. No pneumothorax. 2. Postoperative atelectasis and pulmonary venous congestion. Electronically Signed   By: Monte Fantasia M.D.   On: 04/05/2016 03:26   Dg Swallowing Func-speech Pathology  Result Date: 04/13/2016 Objective Swallowing Evaluation: Type of Study: MBS-Modified Barium Swallow Study Patient Details Name: TASHAN CRUZHERNANDEZ MRN: [id number] Date of Birth: 1931-09-09 Today's Date: 04/13/2016 Time: SLP Start Time (ACUTE ONLY): 1430-SLP Stop Time (ACUTE ONLY): 1500 SLP Time Calculation (min) (ACUTE ONLY): 30 min Past Medical History: Past Medical History: Diagnosis Date . Anemia  . Aortic insufficiency 08/11/2015 . Basal cell cancer  . Brain tumor ([place])  . GERD (gastroesophageal reflux disease)  . Herniated disc   lumbar spine . Hiatal hernia  . History of radiation therapy   back approximately 2010 prostate radiation under care of Dr. Valere Dross  . Hyperlipidemia  . Hypertension  . Meningioma ([place])   brain . Nocturia  . Occult blood in stools  . OSA (obstructive sleep apnea) 10/28/2014  Moderate with AHI 21/hr . Prostate cancer ([place])  . Restless leg  . Seizures ([place])  Past Surgical History: Past Surgical History: Procedure Laterality Date . APPLICATION OF CRANIAL NAVIGATION N/A [id number]  Procedure: APPLICATION OF CRANIAL NAVIGATION;  Surgeon: Consuella Lose, MD;  Location: [place] NEURO ORS;  Service: Neurosurgery;  Laterality: N/A;  APPLICATION OF CRANIAL NAVIGATION . Bilateral cateract surgery   . COLONOSCOPY    colon polyp removed . CRANIOTOMY Right 09/27/2013  Procedure: RIGHT FRONTAL CRANIOTOMY FOR TUMOR RESECTION ;  Surgeon: Consuella Lose, MD;  Location: [place] NEURO ORS;  Service: Neurosurgery;  Laterality: Right; . CRANIOTOMY Right 02/17/2015   Procedure: Right frontal parietal craniotomy for resection of meningioma with brainlab;  Surgeon: Consuella Lose, MD;  Location: [place] NEURO ORS;  Service: Neurosurgery;  Laterality: Right;  Right frontal parietal craniotomy for resection of meningioma with brainlab . JOINT REPLACEMENT    right knee replacement . LAPAROTOMY N/A 04/04/2016  Procedure: EXPLORATORY LAPAROTOMY,  REPAIR OF GASTRIC PERFORATION WITH OMENTAL PATCH, PLACEMENT OF GASTROSTOMY TUBE;  Surgeon: Autumn Messing III, MD;  Location: [facility];  Service: General;  Laterality: N/A; . PROSTATE BIOPSY   . TONSILLECTOMY    Agae 6-7 . VASECTOMY   HPI: 82 yo male adm to [facility] with abdominal pain - pt is s/p surgery with PEG placed 7/17 with intubation a few days.  Per wife, pt has been in ICU and has not had po until yesterday.   Pt started po intake yesteday and was coughing with liquids, therefore diet was changed to nectar liquids and SLP ordered.   Diet was advanced to regular/thin today.  Pt PMH + for meningioma s/p XRT and surgery, anemia, seizures.  Pt CXR shows atelectasis.   Subjective: pt seen in radiology for MBS Assessment / Plan / Recommendation CHL IP CLINICAL IMPRESSIONS 04/13/2016 Therapy Diagnosis Mild oral phase dysphagia;Mild pharyngeal phase dysphagia Clinical Impression Pt presents with mild oropharyngeal dysphagia, characterized by poor bolus formation of puree and solid with posterior spillage to the vallecula, and right anterior leakage of liquids. Swallow reflex was delayed, with trigger at the vallecula on the initial swallow. Poor airway closure resulted in flash penetration of thin liquids during the initial swallow. Nectar thick liquid prevented penetration of the initial swallow, however, Residue of thin and nectar thick liquid on tongue base and vallecula was noted to spill to the pyriform prior to trigger of (cued) dry swallow.  Small boluses and immediate second swallow prevented penetration of nectar thick liquids. Recommend dys 3  solids with chopped meats and nectar thick liquids, SMALL bites and sips at a SLOW rate, with 2 swallows per bite/sip. Meds one at a time with puree. RN was notified of results and recommendations, and safe swallow precautions were posted at [facility]. ST will follow for education and assessment of diet tolerance. Recommend repeat MBS prior to advancing liquids, given lack of reflexive response to penetration on this study. Impact on safety and function Risk for inadequate nutrition/hydration;Moderate aspiration risk;Mild aspiration risk   CHL IP TREATMENT RECOMMENDATION 04/13/2016 Treatment Recommendations Therapy as outlined in treatment plan below   Prognosis 04/13/2016 Prognosis for Safe Diet Advancement Guarded Barriers to Reach Goals Motivation CHL IP DIET RECOMMENDATION 04/13/2016 SLP Diet Recommendations Dysphagia 3 (Mech soft) solids;Nectar thick liquid Liquid Administration via No straw;Cup Medication Administration Whole meds with puree Compensations Slow rate;Small sips/bites;Clear throat intermittently;Other (Comment);Minimize environmental distractions;Multiple dry swallows after each bite/sip Postural Changes Seated upright at 90 degrees   CHL IP OTHER RECOMMENDATIONS 04/13/2016 Recommended Consults -- Oral Care Recommendations Oral care BID Other Recommendations Order thickener from pharmacy;Prohibited food (jello, ice cream, thin soups);Remove water pitcher   CHL IP FOLLOW UP RECOMMENDATIONS 04/12/2016 Follow up Recommendations To be determined   CHL IP FREQUENCY AND DURATION 04/13/2016 Speech Therapy Frequency (ACUTE ONLY) min 2x/week Treatment Duration 1 week    CHL IP ORAL PHASE 04/13/2016 Oral Phase Impaired Oral - Nectar Cup Right anterior bolus loss Oral - Thin Cup Right anterior bolus loss Oral - Puree Reduced posterior propulsion;Delayed oral transit;Premature spillage Oral - Multi-Consistency Premature spillage;Delayed oral transit;Reduced posterior propulsion  CHL IP PHARYNGEAL PHASE 04/13/2016  Pharyngeal Phase Impaired Pharyngeal- Nectar Cup Delayed swallow initiation-vallecula;Reduced pharyngeal peristalsis;Reduced anterior laryngeal mobility;Reduced laryngeal elevation;Reduced airway/laryngeal closure;Reduced tongue base retraction;Penetration/Aspiration during swallow;Pharyngeal residue - valleculae;Delayed swallow initiation-pyriform sinuses Pharyngeal Material enters airway, remains ABOVE vocal cords then ejected out Pharyngeal- Thin Cup Delayed swallow initiation-vallecula;Delayed swallow initiation-pyriform sinuses;Reduced pharyngeal peristalsis;Reduced anterior laryngeal mobility;Reduced laryngeal elevation;Reduced airway/laryngeal closure;Pharyngeal  residue - valleculae;Reduced tongue base retraction;Penetration/Aspiration during swallow Pharyngeal Material enters airway, remains ABOVE vocal cords then ejected out Pharyngeal- Puree Delayed swallow initiation-vallecula;Reduced tongue base retraction Pharyngeal- Multi-consistency Delayed swallow initiation-vallecula;Reduced tongue base retraction  CHL IP CERVICAL ESOPHAGEAL PHASE 04/13/2016 Cervical Esophageal Phase [facility], CCC-SLP [id number] Shonna Chock 04/13/2016, 3:19 PM               Assessment/Plan  Generalized weakness From deconditioning. Will have patient work with PT/OT as tolerated to regain strength and restore function.  Fall precautions are in place.  Acute pulmonary embolism With right heart strain, breathing stable at present. Continue xarelto 15 mg bid for now until 05/18/16 and then 20 mg daily from 05/19/16. Monitor his breathing.  g tube leakage S/p removal of g tube and will need wound vac in place. Continue wound care.  Perforated gastric ulcer S/p surgical repair. Continue wound care. Follow up with his surgeon. Continue tylenol 650 mg q4h prn pain. Continue famotidine 20 mg bid.   Paraesophageal fluid collection On empiric rx with augmentin 875 mg bid until 05/03/16. Monitor  clinically  HTN Soft BP reading, off all antihypertensive agent. Monitor BP q shift and consider ted hose to help. Check orthostatics as well  Constipation On miralax daily and senokot s bid. Monitor clinically, hydration to be maintained.   Cough Continue robitussin 5 ml q4h prn cough and monitor  Anemia From blood loss during surgery and with his acute illness. Monitor cbc. Continue feso4 daily  Fungal rash Continue nystatin cream  Protein calorie malnutrition Get RD consult. Continue feeding supplement and monitor weekly weight. Continue MVI  Meningioma Continue his slow taper of decadron to be completed on 05/13/16. Continue lamictal and keppra for seizure prevention  Seizure disorder Remains seizure free in the facility. Continue lamictal and keppra for now  RLS Continue his requip and monitor   Goals of care: short term rehabilitation   Labs/tests ordered: cbc, cmp 05/04/16   Family/ staff Communication: reviewed care plan with patient, his wife and nursing supervisor    Blanchie Serve, MD Internal Medicine [address] [place], [place] [address] Cell Phone (Monday-Friday 8 am - 5 pm): [phone] On Call: [phone] and follow prompts after 5 pm and on weekends Office Phone: [phone] Office Fax: [phone]

## 2016-05-06 ENCOUNTER — Ambulatory Visit: Payer: Medicare Other

## 2016-05-06 ENCOUNTER — Inpatient Hospital Stay (HOSPITAL_COMMUNITY)
Admission: EM | Admit: 2016-05-06 | Discharge: 2016-05-10 | DRG: 391 | Disposition: A | Discharge status: Skilled Nursing Facility | Payer: Medicare Other | Attending: Internal Medicine | Admitting: Internal Medicine

## 2016-05-06 ENCOUNTER — Encounter (HOSPITAL_COMMUNITY): Payer: Self-pay | Admitting: *Deleted

## 2016-05-06 DIAGNOSIS — D509 Iron deficiency anemia, unspecified: Secondary | ICD-10-CM | POA: Diagnosis present

## 2016-05-06 DIAGNOSIS — Z91018 Allergy to other foods: Secondary | ICD-10-CM

## 2016-05-06 DIAGNOSIS — K2960 Other gastritis without bleeding: Secondary | ICD-10-CM | POA: Diagnosis present

## 2016-05-06 DIAGNOSIS — Z86711 Personal history of pulmonary embolism: Secondary | ICD-10-CM

## 2016-05-06 DIAGNOSIS — Z86718 Personal history of other venous thrombosis and embolism: Secondary | ICD-10-CM

## 2016-05-06 DIAGNOSIS — E878 Other disorders of electrolyte and fluid balance, not elsewhere classified: Secondary | ICD-10-CM | POA: Diagnosis present

## 2016-05-06 DIAGNOSIS — G2581 Restless legs syndrome: Secondary | ICD-10-CM | POA: Diagnosis present

## 2016-05-06 DIAGNOSIS — D329 Benign neoplasm of meninges, unspecified: Secondary | ICD-10-CM | POA: Diagnosis present

## 2016-05-06 DIAGNOSIS — G4733 Obstructive sleep apnea (adult) (pediatric): Secondary | ICD-10-CM | POA: Diagnosis present

## 2016-05-06 DIAGNOSIS — Z820 Family history of epilepsy and other diseases of the nervous system: Secondary | ICD-10-CM

## 2016-05-06 DIAGNOSIS — K210 Gastro-esophageal reflux disease with esophagitis: Principal | ICD-10-CM | POA: Diagnosis present

## 2016-05-06 DIAGNOSIS — Z85828 Personal history of other malignant neoplasm of skin: Secondary | ICD-10-CM

## 2016-05-06 DIAGNOSIS — I82402 Acute embolism and thrombosis of unspecified deep veins of left lower extremity: Secondary | ICD-10-CM | POA: Diagnosis present

## 2016-05-06 DIAGNOSIS — Z79899 Other long term (current) drug therapy: Secondary | ICD-10-CM

## 2016-05-06 DIAGNOSIS — I10 Essential (primary) hypertension: Secondary | ICD-10-CM | POA: Diagnosis present

## 2016-05-06 DIAGNOSIS — K219 Gastro-esophageal reflux disease without esophagitis: Secondary | ICD-10-CM | POA: Diagnosis present

## 2016-05-06 DIAGNOSIS — R0602 Shortness of breath: Secondary | ICD-10-CM

## 2016-05-06 DIAGNOSIS — T380X5A Adverse effect of glucocorticoids and synthetic analogues, initial encounter: Secondary | ICD-10-CM | POA: Diagnosis present

## 2016-05-06 DIAGNOSIS — Z8546 Personal history of malignant neoplasm of prostate: Secondary | ICD-10-CM

## 2016-05-06 DIAGNOSIS — K920 Hematemesis: Secondary | ICD-10-CM

## 2016-05-06 DIAGNOSIS — R059 Cough, unspecified: Secondary | ICD-10-CM

## 2016-05-06 DIAGNOSIS — K256 Chronic or unspecified gastric ulcer with both hemorrhage and perforation: Secondary | ICD-10-CM | POA: Diagnosis present

## 2016-05-06 DIAGNOSIS — Z8711 Personal history of peptic ulcer disease: Secondary | ICD-10-CM

## 2016-05-06 DIAGNOSIS — Z86011 Personal history of benign neoplasm of the brain: Secondary | ICD-10-CM

## 2016-05-06 DIAGNOSIS — K279 Peptic ulcer, site unspecified, unspecified as acute or chronic, without hemorrhage or perforation: Secondary | ICD-10-CM | POA: Diagnosis present

## 2016-05-06 DIAGNOSIS — Z87891 Personal history of nicotine dependence: Secondary | ICD-10-CM

## 2016-05-06 DIAGNOSIS — R05 Cough: Secondary | ICD-10-CM

## 2016-05-06 DIAGNOSIS — I351 Nonrheumatic aortic (valve) insufficiency: Secondary | ICD-10-CM | POA: Diagnosis present

## 2016-05-06 DIAGNOSIS — Z7952 Long term (current) use of systemic steroids: Secondary | ICD-10-CM

## 2016-05-06 DIAGNOSIS — Z96651 Presence of right artificial knee joint: Secondary | ICD-10-CM | POA: Diagnosis present

## 2016-05-06 DIAGNOSIS — Z7901 Long term (current) use of anticoagulants: Secondary | ICD-10-CM

## 2016-05-06 DIAGNOSIS — Z885 Allergy status to narcotic agent status: Secondary | ICD-10-CM

## 2016-05-06 DIAGNOSIS — I2699 Other pulmonary embolism without acute cor pulmonale: Secondary | ICD-10-CM | POA: Diagnosis present

## 2016-05-06 DIAGNOSIS — D72829 Elevated white blood cell count, unspecified: Secondary | ICD-10-CM | POA: Diagnosis present

## 2016-05-06 DIAGNOSIS — E871 Hypo-osmolality and hyponatremia: Secondary | ICD-10-CM | POA: Diagnosis present

## 2016-05-06 DIAGNOSIS — Z923 Personal history of irradiation: Secondary | ICD-10-CM

## 2016-05-06 DIAGNOSIS — I959 Hypotension, unspecified: Secondary | ICD-10-CM | POA: Diagnosis present

## 2016-05-06 DIAGNOSIS — R1310 Dysphagia, unspecified: Secondary | ICD-10-CM | POA: Diagnosis present

## 2016-05-06 DIAGNOSIS — G9389 Other specified disorders of brain: Secondary | ICD-10-CM | POA: Diagnosis present

## 2016-05-06 DIAGNOSIS — E785 Hyperlipidemia, unspecified: Secondary | ICD-10-CM | POA: Diagnosis present

## 2016-05-06 DIAGNOSIS — Z886 Allergy status to analgesic agent status: Secondary | ICD-10-CM

## 2016-05-06 DIAGNOSIS — K449 Diaphragmatic hernia without obstruction or gangrene: Secondary | ICD-10-CM | POA: Diagnosis present

## 2016-05-06 MED ORDER — SODIUM CHLORIDE 0.9 % IV SOLN
INTRAVENOUS | Status: DC
  Administered 2016-05-07: via INTRAVENOUS

## 2016-05-06 MED ORDER — PANTOPRAZOLE SODIUM 40 MG IV SOLR
40.0000 mg | Freq: Once | INTRAVENOUS | Status: AC
  Administered 2016-05-07: 40 mg via INTRAVENOUS
  Filled 2016-05-06: qty 40

## 2016-05-06 NOTE — ED Triage Notes (Signed)
Patient is alert and oriented x4.  He is complaining of vomiting blood.  Patient was seen at [facility] 2 weeks ago for same issue.  Currently pain has no complaints

## 2016-05-06 NOTE — ED Notes (Signed)
Bed: WA06 Expected date:  Expected time:  Means of arrival:  Comments: 51 M coffee ground emesis

## 2016-05-06 NOTE — ED Provider Notes (Signed)
[facility] DEPT Provider Note   CSN: [id number] Arrival date & time: 05/06/16  2308  By signing my name below, I, Gwenlyn Fudge, attest that this documentation has been prepared under the direction and in the presence of Leo Grosser, MD. Electronically Signed: Gwenlyn Fudge, ED Scribe. 05/06/16. 11:53 PM.  History   Chief Complaint Chief Complaint  Patient presents with  . Hematemesis   The history is provided by the patient and the spouse. No language interpreter was used.    HPI Comments: [name] is a 82 y.o. male with PMHx of GERD who presents to the Emergency Department complaining of gradual onset and worsening, persistent cough onset 3 weeks. Pt states hematemesis began last week during a coughing fit. He states he vomited 3-4 times today, but reports he has not felt like vomiting since. He states he noticed blood in the vomit, but that the overall color has been "coffee brown". Pt reports he has since had a more productive cough. Pt states he has not had any other bleeding episodes before today. Pt reports he had an ulcer repaired 3 weeks ago. Wife states pt has Hx of DVT and PE and is currently taking Xarelto and Pepcid. He states he has had noticed increased acid reflux over the past few weeks.  Past Medical History:  Diagnosis Date  . Anemia   . Aortic insufficiency 08/11/2015  . Basal cell cancer   . Brain tumor ([place])   . GERD (gastroesophageal reflux disease)   . Herniated disc    lumbar spine  . Hiatal hernia   . History of radiation therapy    back approximately 2010 prostate radiation under care of Dr. Valere Dross   . Hyperlipidemia   . Hypertension   . Meningioma ([place])    brain  . Nocturia   . Occult blood in stools   . OSA (obstructive sleep apnea) 10/28/2014   Moderate with AHI 21/hr  . Prostate cancer ([place])   . Restless leg   . Seizures [facility])     Patient Active Problem List   Diagnosis Date Noted  . Lactic acidosis 04/23/2016  . Leukocytosis  04/23/2016  . Nausea & vomiting 04/23/2016  . Nausea 04/23/2016  . Skin irritation 04/23/2016  . History of recent steroid use 04/23/2016  . Pulmonary embolus ([place]) 04/23/2016  . Cough 04/22/2016  . Acute delirium 04/08/2016  . Perforated bowel ([place]) 04/04/2016  . Humerus fracture 12/31/2015  . Closed right humeral fracture 12/31/2015  . Aortic insufficiency 08/11/2015  . 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 Atypical meningioma of brain ([place]) 02/17/2015  . Obesity (BMI 30-39.9) 02/13/2015  . OSA (obstructive sleep apnea) 10/28/2014  . SOB (shortness of breath) 07/31/2014  . Fatigue due to excessive exertion 07/31/2014  . Edema extremities 07/31/2014  . Hypersomnia 07/31/2014  . Meningioma, recurrent of brain ([place]) 09/27/2013  . Unspecified constipation 09/26/2013  . Anemia, iron deficiency 09/26/2013  . Dizziness 09/20/2013  . Fall at home 09/20/2013  . Dehydration 09/20/2013  . Orthostasis 09/20/2013  . CKD (chronic kidney disease), stage III 09/20/2013  . Brain mass 09/20/2013  . Gait instability 08/20/2013  . Hypokalemia 10/09/2012  . Ileus ([place]) 10/08/2012  . Acute kidney injury ([place]) 10/08/2012  . Hypertension 10/08/2012  . Hyperlipidemia 10/08/2012  . GERD (gastroesophageal reflux disease) 10/08/2012  . Restless leg syndrome 10/08/2012    Past Surgical History:  Procedure Laterality Date  .  APPLICATION OF CRANIAL NAVIGATION N/A 02/17/2015   Procedure: APPLICATION OF CRANIAL NAVIGATION;  Surgeon: Consuella Lose, MD;  Location: [place] NEURO ORS;  Service: Neurosurgery;  Laterality: N/A;  APPLICATION OF CRANIAL NAVIGATION  . Bilateral cateract surgery    . COLONOSCOPY     colon polyp removed  . CRANIOTOMY Right 09/27/2013   Procedure: RIGHT FRONTAL CRANIOTOMY FOR TUMOR RESECTION ;  Surgeon: Consuella Lose, MD;  Location: [place] NEURO  ORS;  Service: Neurosurgery;  Laterality: Right;  . CRANIOTOMY Right 02/17/2015   Procedure: Right frontal parietal craniotomy for resection of meningioma with brainlab;  Surgeon: Consuella Lose, MD;  Location: [place] NEURO ORS;  Service: Neurosurgery;  Laterality: Right;  Right frontal parietal craniotomy for resection of meningioma with brainlab  . JOINT REPLACEMENT     right knee replacement  . LAPAROTOMY N/A 04/04/2016   Procedure: EXPLORATORY LAPAROTOMY, REPAIR OF GASTRIC PERFORATION WITH OMENTAL PATCH, PLACEMENT OF GASTROSTOMY TUBE;  Surgeon: Autumn Messing III, MD;  Location: [facility]S;  Service: General;  Laterality: N/A;  . PROSTATE BIOPSY    . TONSILLECTOMY     Agae 6-7  . VASECTOMY       Home Medications    Prior to Admission medications   Medication Sig Start Date End Date Taking? Authorizing Provider  acetaminophen (TYLENOL) 325 MG tablet Take 2 tablets (650 mg total) by mouth every 4 (four) hours as needed. 04/15/16 04/15/17  Earnstine Regal, PA-C  albuterol (PROVENTIL) (2.5 MG/3ML) 0.083% nebulizer solution Take 3 mLs (2.5 mg total) by nebulization every 4 (four) hours as needed for wheezing or shortness of breath. 04/15/16   Earnstine Regal, PA-C  amoxicillin-clavulanate (AUGMENTIN) 875-125 MG tablet Take 1 tablet by mouth every 12 (twelve) hours. For 3days 04/30/16   Domenic Polite, MD  b complex vitamins tablet Take 1 tablet by mouth daily.    Historical Provider, MD  dexamethasone (DECADRON) 0.5 MG tablet Take 0.5 mg by mouth daily. Stop date 05/13/16    Historical Provider, MD  famotidine (PEPCID) 20 MG tablet Take 1 tablet (20 mg total) by mouth 2 (two) times daily. 04/15/16   Earnstine Regal, PA-C  ferrous sulfate 325 (65 FE) MG tablet Take 1,300 mg by mouth every morning.     Historical Provider, MD  guaiFENesin (ROBITUSSIN) 100 MG/5ML SOLN Take 5 mLs (100 mg total) by mouth every 4 (four) hours as needed for cough or to loosen phlegm. 04/30/16   Domenic Polite, MD  lamoTRIgine  (LAMICTAL) 100 MG tablet Take 1 tablet (100 mg total) by mouth 2 (two) times daily. 04/18/16   Cameron Sprang, MD  levETIRAcetam (KEPPRA) 500 MG tablet 750mg  BID 04/30/16   Domenic Polite, MD  multivitamin-lutein [facility]) CAPS capsule Take 1 capsule by mouth daily.    Historical Provider, MD  nystatin cream (MYCOSTATIN) Apply 1 application topically 2 (two) times daily.    Historical Provider, MD  ondansetron (ZOFRAN-ODT) 4 MG disintegrating tablet Take 1 tablet (4 mg total) by mouth every 6 (six) hours as needed for nausea. 04/15/16   Earnstine Regal, PA-C  polyethylene glycol [facility] / GLYCOLAX) packet Take 17 g by mouth daily. 04/30/16   Domenic Polite, MD  Rivaroxaban (XARELTO) 15 MG TABS tablet Take 1 tablet (15 mg total) by mouth 2 (two) times daily with a meal. 04/30/16 05/18/16  Domenic Polite, MD  rivaroxaban (XARELTO) 20 MG TABS tablet Take 1 tablet (20 mg total) by mouth daily with supper. 05/19/16   Domenic Polite, MD  rOPINIRole (REQUIP) 3 MG tablet  Take 1.5 mg by mouth 2 (two) times daily.    Historical Provider, MD  senna-docusate (SENOKOT-S) 8.6-50 MG tablet Take 1 tablet by mouth 2 (two) times daily. 04/30/16   Domenic Polite, MD  vitamin B-12 (CYANOCOBALAMIN) 100 MCG tablet Take 100 mcg by mouth daily.     Historical Provider, MD    Family History Family History  Problem Relation Age of Onset  . Parkinsonism Mother   . Dementia Father     Social History Social History  Substance Use Topics  . Smoking status: Never Smoker  . Smokeless tobacco: Former Systems developer    Types: Chew    Quit date: 10/08/1973  . Alcohol use No     Allergies   Asa [aspirin]; Other; and Vicodin [hydrocodone-acetaminophen]   Review of Systems Review of Systems  Constitutional: Negative for fever.  Respiratory: Positive for cough.   Cardiovascular: Negative for chest pain.  Gastrointestinal: Positive for vomiting. Negative for abdominal pain and nausea.       + Hematemsis  All other systems  reviewed and are negative.  Physical Exam Updated Vital Signs BP 107/61 (BP Location: Right Arm)   Pulse 91   Temp 97.8 F (36.6 C) (Oral)   Resp 18   Ht 5\' 6"  (1.676 m)   Wt 183 lb (83 kg)   SpO2 97%   BMI 29.54 kg/m   Physical Exam  Constitutional: He appears well-developed and well-nourished.  HENT:  Head: Normocephalic.  Eyes: Conjunctivae are normal.  Cardiovascular: Normal rate.   Pulmonary/Chest: Effort normal. No respiratory distress.  Abdominal: Soft. Bowel sounds are normal. He exhibits distension. There is no tenderness. There is no rebound and no guarding.  Mild distention  Wound vac; clean, dry and intact coffee brown emesis on gown  Musculoskeletal: Normal range of motion.  Neurological: He is alert.  Skin: Skin is warm and dry.  Psychiatric: He has a normal mood and affect. His behavior is normal.  Nursing note and vitals reviewed.   ED Treatments / Results  DIAGNOSTIC STUDIES: Oxygen Saturation is 97% on RA, adequate by my interpretation.    COORDINATION OF CARE: 11:27 PM Discussed treatment plan with pt at bedside which includes lab work including CBC with Differential/Platelet and Comprehensive Metabolic Panel and pt agreed to plan.  Labs (all labs ordered are listed, but only abnormal results are displayed) Labs Reviewed  CBC WITH DIFFERENTIAL/PLATELET - Abnormal; Notable for the following:       Result Value   WBC 10.7 (*)    RDW 18.4 (*)    Neutro Abs 8.3 (*)    All other components within normal limits  COMPREHENSIVE METABOLIC PANEL - Abnormal; Notable for the following:    Sodium 134 (*)    Chloride 100 (*)    Glucose, Bld 101 (*)    Calcium 8.8 (*)    Total Protein 6.1 (*)    Albumin 3.0 (*)    All other components within normal limits  PROTIME-INR - Abnormal; Notable for the following:    Prothrombin Time 19.1 (*)    All other components within normal limits  APTT - Abnormal; Notable for the following:    aPTT 40 (*)    All other  components within normal limits  I-STAT CG4 LACTIC ACID, ED    EKG  EKG Interpretation None       Radiology No results found.  Procedures Procedures (including critical care time)  Medications Ordered in ED Medications  0.9 %  sodium chloride  infusion ( Intravenous New Bag/Given 05/07/16 0026)  pantoprazole (PROTONIX) injection 40 mg (40 mg Intravenous Given 05/07/16 0026)     Initial Impression / Assessment and Plan / ED Course  I have reviewed the triage vital signs and the nursing notes.  Pertinent labs & imaging results that were available during my care of the patient were reviewed by me and considered in my medical decision making (see chart for details).  Clinical Course    82 y.o. male presents with 3-4 episodes of coffee ground emesis after a week of coughing. Tonight he coughed and gagged resulting in the vomiting episodes. He arrived and had a small episode of hematemesis on his gown here.  He is medically complicated as he had recent admission for DVT/PE and perforated gastric ulcer requiring open abdominal surgery. His wound appears appropriate and he has no abdominal tenderness to suggest complication of surgery currently. He is anticoagulated on Xarelto. He remained hemodynamically stable throughout his emergency department course but due to high risk of continued bleeding he will be admitted for further monitoring and consideration of gastroenterology consultation. Hospitalist was consulted for admission and will see the patient in the emergency department.   Final Clinical Impressions(s) / ED Diagnoses   Final diagnoses:  Hematemesis without nausea  Anticoagulated by anticoagulation treatment    New Prescriptions New Prescriptions   No medications on file   I personally performed the services described in this documentation, which was scribed in my presence. The recorded information has been reviewed and is accurate.      Leo Grosser, MD 05/07/16  2020

## 2016-05-07 ENCOUNTER — Encounter (HOSPITAL_COMMUNITY): Payer: Self-pay | Admitting: Family Medicine

## 2016-05-07 ENCOUNTER — Encounter (HOSPITAL_COMMUNITY)
Admission: EM | Disposition: A | Discharge status: Skilled Nursing Facility | Payer: Self-pay | Source: Home / Self Care | Attending: Internal Medicine

## 2016-05-07 DIAGNOSIS — E785 Hyperlipidemia, unspecified: Secondary | ICD-10-CM | POA: Diagnosis present

## 2016-05-07 DIAGNOSIS — Z7901 Long term (current) use of anticoagulants: Secondary | ICD-10-CM

## 2016-05-07 DIAGNOSIS — K256 Chronic or unspecified gastric ulcer with both hemorrhage and perforation: Secondary | ICD-10-CM | POA: Diagnosis present

## 2016-05-07 DIAGNOSIS — I959 Hypotension, unspecified: Secondary | ICD-10-CM | POA: Diagnosis present

## 2016-05-07 DIAGNOSIS — Z86711 Personal history of pulmonary embolism: Secondary | ICD-10-CM | POA: Diagnosis not present

## 2016-05-07 DIAGNOSIS — D72829 Elevated white blood cell count, unspecified: Secondary | ICD-10-CM | POA: Diagnosis present

## 2016-05-07 DIAGNOSIS — I10 Essential (primary) hypertension: Secondary | ICD-10-CM | POA: Diagnosis present

## 2016-05-07 DIAGNOSIS — D329 Benign neoplasm of meninges, unspecified: Secondary | ICD-10-CM | POA: Diagnosis present

## 2016-05-07 DIAGNOSIS — G939 Disorder of brain, unspecified: Secondary | ICD-10-CM | POA: Diagnosis not present

## 2016-05-07 DIAGNOSIS — G4733 Obstructive sleep apnea (adult) (pediatric): Secondary | ICD-10-CM | POA: Diagnosis present

## 2016-05-07 DIAGNOSIS — R1310 Dysphagia, unspecified: Secondary | ICD-10-CM | POA: Diagnosis present

## 2016-05-07 DIAGNOSIS — G2581 Restless legs syndrome: Secondary | ICD-10-CM | POA: Diagnosis present

## 2016-05-07 DIAGNOSIS — T380X5A Adverse effect of glucocorticoids and synthetic analogues, initial encounter: Secondary | ICD-10-CM | POA: Diagnosis present

## 2016-05-07 DIAGNOSIS — I351 Nonrheumatic aortic (valve) insufficiency: Secondary | ICD-10-CM | POA: Diagnosis present

## 2016-05-07 DIAGNOSIS — K219 Gastro-esophageal reflux disease without esophagitis: Secondary | ICD-10-CM | POA: Diagnosis not present

## 2016-05-07 DIAGNOSIS — E871 Hypo-osmolality and hyponatremia: Secondary | ICD-10-CM | POA: Diagnosis present

## 2016-05-07 DIAGNOSIS — K210 Gastro-esophageal reflux disease with esophagitis: Secondary | ICD-10-CM | POA: Diagnosis present

## 2016-05-07 DIAGNOSIS — K2960 Other gastritis without bleeding: Secondary | ICD-10-CM | POA: Diagnosis present

## 2016-05-07 DIAGNOSIS — K920 Hematemesis: Secondary | ICD-10-CM

## 2016-05-07 DIAGNOSIS — D509 Iron deficiency anemia, unspecified: Secondary | ICD-10-CM | POA: Diagnosis present

## 2016-05-07 DIAGNOSIS — Z923 Personal history of irradiation: Secondary | ICD-10-CM | POA: Diagnosis not present

## 2016-05-07 DIAGNOSIS — Z86011 Personal history of benign neoplasm of the brain: Secondary | ICD-10-CM | POA: Diagnosis not present

## 2016-05-07 DIAGNOSIS — I2699 Other pulmonary embolism without acute cor pulmonale: Secondary | ICD-10-CM | POA: Diagnosis present

## 2016-05-07 DIAGNOSIS — K449 Diaphragmatic hernia without obstruction or gangrene: Secondary | ICD-10-CM | POA: Diagnosis present

## 2016-05-07 DIAGNOSIS — E878 Other disorders of electrolyte and fluid balance, not elsewhere classified: Secondary | ICD-10-CM | POA: Diagnosis present

## 2016-05-07 DIAGNOSIS — K279 Peptic ulcer, site unspecified, unspecified as acute or chronic, without hemorrhage or perforation: Secondary | ICD-10-CM

## 2016-05-07 DIAGNOSIS — Z86718 Personal history of other venous thrombosis and embolism: Secondary | ICD-10-CM | POA: Diagnosis not present

## 2016-05-07 DIAGNOSIS — I82402 Acute embolism and thrombosis of unspecified deep veins of left lower extremity: Secondary | ICD-10-CM | POA: Diagnosis present

## 2016-05-07 HISTORY — PX: ESOPHAGOGASTRODUODENOSCOPY: SHX5428

## 2016-05-07 LAB — COMPREHENSIVE METABOLIC PANEL
ALK PHOS: 74 U/L (ref 38–126)
ALT: 20 U/L (ref 17–63)
ANION GAP: 7 (ref 5–15)
AST: 21 U/L (ref 15–41)
Albumin: 3 g/dL — ABNORMAL LOW (ref 3.5–5.0)
BUN: 19 mg/dL (ref 6–20)
CALCIUM: 8.8 mg/dL — AB (ref 8.9–10.3)
CO2: 27 mmol/L (ref 22–32)
Chloride: 100 mmol/L — ABNORMAL LOW (ref 101–111)
Creatinine, Ser: 0.96 mg/dL (ref 0.61–1.24)
GFR calc non Af Amer: >60 mL/min (ref >60)
Glucose, Bld: 101 mg/dL — ABNORMAL HIGH (ref 65–99)
Potassium: 3.7 mmol/L (ref 3.5–5.1)
SODIUM: 134 mmol/L — AB (ref 135–145)
Total Bilirubin: 0.6 mg/dL (ref 0.3–1.2)
Total Protein: 6.1 g/dL — ABNORMAL LOW (ref 6.5–8.1)

## 2016-05-07 LAB — CBC WITH DIFFERENTIAL/PLATELET
BASOS PCT: 1 %
Basophils Absolute: 0.1 10*3/uL (ref 0.0–0.1)
EOS ABS: 0.2 10*3/uL (ref 0.0–0.7)
EOS PCT: 2 %
HCT: 42.4 % (ref 39.0–52.0)
Hemoglobin: 13.4 g/dL (ref 13.0–17.0)
Lymphocytes Relative: 12 %
Lymphs Abs: 1.3 10*3/uL (ref 0.7–4.0)
MCH: 29.2 pg (ref 26.0–34.0)
MCHC: 31.6 g/dL (ref 30.0–36.0)
MCV: 92.4 fL (ref 78.0–100.0)
MONO ABS: 0.8 10*3/uL (ref 0.1–1.0)
MONOS PCT: 7 %
Neutro Abs: 8.3 10*3/uL — ABNORMAL HIGH (ref 1.7–7.7)
Neutrophils Relative %: 78 %
Platelets: 361 10*3/uL (ref 150–400)
RBC: 4.59 MIL/uL (ref 4.22–5.81)
RDW: 18.4 % — AB (ref 11.5–15.5)
WBC: 10.7 10*3/uL — ABNORMAL HIGH (ref 4.0–10.5)

## 2016-05-07 LAB — TYPE AND SCREEN
ABO/RH(D): O POS
ANTIBODY SCREEN: NEGATIVE

## 2016-05-07 LAB — HEMOGLOBIN
HEMOGLOBIN: 11.5 g/dL — AB (ref 13.0–17.0)
Hemoglobin: 12.4 g/dL — ABNORMAL LOW (ref 13.0–17.0)
Hemoglobin: 12 g/dL — ABNORMAL LOW (ref 13.0–17.0)
Hemoglobin: 11.7 g/dL — ABNORMAL LOW (ref 13.0–17.0)

## 2016-05-07 LAB — BASIC METABOLIC PANEL
Anion gap: 6 (ref 5–15)
BUN: 16 mg/dL (ref 6–20)
CALCIUM: 7.9 mg/dL — AB (ref 8.9–10.3)
CO2: 26 mmol/L (ref 22–32)
Chloride: 103 mmol/L (ref 101–111)
Creatinine, Ser: 0.84 mg/dL (ref 0.61–1.24)
GFR calc Af Amer: >60 mL/min (ref >60)
GLUCOSE: 92 mg/dL (ref 65–99)
Potassium: 3.4 mmol/L — ABNORMAL LOW (ref 3.5–5.1)
Sodium: 135 mmol/L (ref 135–145)

## 2016-05-07 LAB — I-STAT CG4 LACTIC ACID, ED: LACTIC ACID, VENOUS: 0.92 mmol/L (ref 0.5–1.9)

## 2016-05-07 LAB — URINALYSIS, ROUTINE W REFLEX MICROSCOPIC
BILIRUBIN URINE: NEGATIVE
GLUCOSE, UA: NEGATIVE mg/dL
HGB URINE DIPSTICK: NEGATIVE
KETONES UR: NEGATIVE mg/dL
Leukocytes, UA: NEGATIVE
Nitrite: NEGATIVE
PROTEIN: NEGATIVE mg/dL
Specific Gravity, Urine: 1.026 (ref 1.005–1.030)
pH: 6.5 (ref 5.0–8.0)

## 2016-05-07 LAB — GLUCOSE, CAPILLARY: GLUCOSE-CAPILLARY: 89 mg/dL (ref 65–99)

## 2016-05-07 LAB — HEMATOCRIT
HCT: 38.9 % — ABNORMAL LOW (ref 39.0–52.0)
HEMATOCRIT: 37.8 % — AB (ref 39.0–52.0)
HEMATOCRIT: 37.6 % — AB (ref 39.0–52.0)
HEMATOCRIT: 36.8 % — AB (ref 39.0–52.0)

## 2016-05-07 LAB — APTT: APTT: 40 s — AB (ref 24–36)

## 2016-05-07 LAB — PROTIME-INR
INR: 1.59
Prothrombin Time: 19.1 seconds — ABNORMAL HIGH (ref 11.4–15.2)

## 2016-05-07 SURGERY — EGD (ESOPHAGOGASTRODUODENOSCOPY)
Anesthesia: Moderate Sedation

## 2016-05-07 MED ORDER — PANTOPRAZOLE SODIUM 40 MG PO TBEC
40.0000 mg | DELAYED_RELEASE_TABLET | Freq: Two times a day (BID) | ORAL | Status: DC
  Administered 2016-05-07 – 2016-05-10 (×7): 40 mg via ORAL
  Filled 2016-05-07 (×7): qty 1

## 2016-05-07 MED ORDER — ALBUTEROL SULFATE (2.5 MG/3ML) 0.083% IN NEBU: 2.5000 mg | INHALATION_SOLUTION | RESPIRATORY_TRACT | Status: DC | PRN

## 2016-05-07 MED ORDER — SODIUM CHLORIDE 0.9 % IV SOLN: INTRAVENOUS | Status: DC

## 2016-05-07 MED ORDER — OCUVITE-LUTEIN PO TABS
1.0000 | ORAL_TABLET | Freq: Every day | ORAL | Status: DC
  Administered 2016-05-09 – 2016-05-10 (×2): 1 via ORAL
  Filled 2016-05-07 (×4): qty 1

## 2016-05-07 MED ORDER — PANTOPRAZOLE SODIUM 40 MG IV SOLR
40.0000 mg | Freq: Two times a day (BID) | INTRAVENOUS | Status: DC
  Administered 2016-05-07 (×2): 40 mg via INTRAVENOUS
  Filled 2016-05-07 (×2): qty 40

## 2016-05-07 MED ORDER — LEVETIRACETAM 750 MG PO TABS: 750.0000 mg | ORAL_TABLET | Freq: Two times a day (BID) | ORAL | Status: DC

## 2016-05-07 MED ORDER — FAMOTIDINE 20 MG PO TABS
20.0000 mg | ORAL_TABLET | Freq: Two times a day (BID) | ORAL | Status: AC
  Administered 2016-05-07 – 2016-05-08 (×5): 20 mg via ORAL
  Filled 2016-05-07 (×5): qty 1

## 2016-05-07 MED ORDER — MIDAZOLAM HCL 10 MG/2ML IJ SOLN
INTRAMUSCULAR | Status: DC | PRN
  Administered 2016-05-07 (×2): 2 mg via INTRAVENOUS

## 2016-05-07 MED ORDER — FENTANYL CITRATE (PF) 100 MCG/2ML IJ SOLN
INTRAMUSCULAR | Status: DC | PRN
  Administered 2016-05-07: 25 ug via INTRAVENOUS

## 2016-05-07 MED ORDER — DEXAMETHASONE 0.5 MG PO TABS
0.5000 mg | ORAL_TABLET | Freq: Every day | ORAL | Status: DC
  Administered 2016-05-07 – 2016-05-10 (×4): 0.5 mg via ORAL
  Filled 2016-05-07 (×4): qty 1

## 2016-05-07 MED ORDER — ONDANSETRON HCL 4 MG/2ML IJ SOLN: 4.0000 mg | Freq: Four times a day (QID) | INTRAMUSCULAR | Status: DC | PRN

## 2016-05-07 MED ORDER — FENTANYL CITRATE (PF) 100 MCG/2ML IJ SOLN
INTRAMUSCULAR | Status: AC
  Filled 2016-05-07: qty 2

## 2016-05-07 MED ORDER — DIPHENHYDRAMINE HCL 50 MG/ML IJ SOLN
INTRAMUSCULAR | Status: AC
  Filled 2016-05-07: qty 1

## 2016-05-07 MED ORDER — SODIUM CHLORIDE 0.9 % IV SOLN
INTRAVENOUS | Status: AC
  Administered 2016-05-07: 03:00:00 via INTRAVENOUS

## 2016-05-07 MED ORDER — VITAMIN B-12 100 MCG PO TABS
100.0000 ug | ORAL_TABLET | Freq: Every day | ORAL | Status: DC
  Administered 2016-05-07 – 2016-05-10 (×4): 100 ug via ORAL
  Filled 2016-05-07 (×4): qty 1

## 2016-05-07 MED ORDER — LAMOTRIGINE 100 MG PO TABS: 100.0000 mg | ORAL_TABLET | Freq: Two times a day (BID) | ORAL | Status: DC

## 2016-05-07 MED ORDER — LAMOTRIGINE 100 MG PO TABS
100.0000 mg | ORAL_TABLET | Freq: Two times a day (BID) | ORAL | Status: DC
  Administered 2016-05-08 – 2016-05-10 (×5): 100 mg via ORAL
  Filled 2016-05-07 (×5): qty 1

## 2016-05-07 MED ORDER — SODIUM CHLORIDE 0.9 % IV SOLN
750.0000 mg | Freq: Two times a day (BID) | INTRAVENOUS | Status: DC
  Administered 2016-05-07 (×3): 750 mg via INTRAVENOUS
  Filled 2016-05-07 (×4): qty 7.5

## 2016-05-07 MED ORDER — SODIUM CHLORIDE 0.9% FLUSH
3.0000 mL | Freq: Two times a day (BID) | INTRAVENOUS | Status: DC
  Administered 2016-05-07 – 2016-05-10 (×3): 3 mL via INTRAVENOUS

## 2016-05-07 MED ORDER — ROPINIROLE HCL 0.5 MG PO TABS
1.5000 mg | ORAL_TABLET | Freq: Two times a day (BID) | ORAL | Status: DC
  Administered 2016-05-07 – 2016-05-10 (×8): 1.5 mg via ORAL
  Filled 2016-05-07 (×9): qty 1

## 2016-05-07 MED ORDER — ONDANSETRON HCL 4 MG PO TABS
4.0000 mg | ORAL_TABLET | Freq: Four times a day (QID) | ORAL | Status: DC | PRN
  Administered 2016-05-10 (×2): 4 mg via ORAL
  Filled 2016-05-07 (×2): qty 1

## 2016-05-07 MED ORDER — DARIFENACIN HYDROBROMIDE ER 7.5 MG PO TB24
7.5000 mg | ORAL_TABLET | Freq: Every day | ORAL | Status: DC
  Administered 2016-05-07 – 2016-05-10 (×4): 7.5 mg via ORAL
  Filled 2016-05-07 (×4): qty 1

## 2016-05-07 MED ORDER — B COMPLEX-C PO TABS
1.0000 | ORAL_TABLET | Freq: Every day | ORAL | Status: DC
  Administered 2016-05-07 – 2016-05-10 (×4): 1 via ORAL
  Filled 2016-05-07 (×4): qty 1

## 2016-05-07 MED ORDER — MIDAZOLAM HCL 5 MG/ML IJ SOLN
INTRAMUSCULAR | Status: AC
  Filled 2016-05-07: qty 2

## 2016-05-07 NOTE — Progress Notes (Signed)
[facility] OF CARE NOTE Patient: [name] [id number]   PCP:  Melinda Crutch, MD DOB: 08/10/1931   DOA: 05/06/2016   DOS: 05/07/2016    Patient was admitted by my colleague Dr.Opyd earlier on 05/07/2016. I have reviewed the H&P as well as assessment and plan and agree with the same. Important changes in the plan are listed below.  Plan of care: Principal Problem:   Hematemesis Active Problems:   GERD (gastroesophageal reflux disease) Status post EGD. No significant finding or active bleeding. GI recommends to resume blood thinner starting tomorrow. Monitor today.    Anticoagulated by anticoagulation treatment   History of pulmonary embolism Will Start with IV heparin tomorrow.   Author: Berle Mull, MD [facility] Pager: [phone] 05/07/2016 5:34 PM   If 7PM-7AM, please contact night-coverage at [email or web address], password [facility]

## 2016-05-07 NOTE — Consult Note (Signed)
Reason for Consult: Upper GI bleeding Referring Physician: Hospital team  [name] is an 82 y.o. male.  HPI: Patient known to me from years of GI care but I was not aware of his recent surgery nor his PEG and blood clots and I have not seen in this year but with increased coughing he has had some coffee-ground emesis and he was on blood thinners which are now being held and his hospital computer chart and our office computer chart was reviewed and his case discussed with the hospital team who also discussed the case with the surgeons who have okayed a endoscopy and further history to follow  Past Medical History:  Diagnosis Date  . Anemia   . Aortic insufficiency 08/11/2015  . Basal cell cancer   . Brain tumor ([place])   . GERD (gastroesophageal reflux disease)   . Herniated disc    lumbar spine  . Hiatal hernia   . History of radiation therapy    back approximately 2010 prostate radiation under care of Dr. Valere Dross   . Hyperlipidemia   . Hypertension   . Meningioma ([place])    brain  . Nocturia   . Occult blood in stools   . OSA (obstructive sleep apnea) 10/28/2014   Moderate with AHI 21/hr  . Prostate cancer ([place])   . Restless leg   . Seizures ([place])     Past Surgical History:  Procedure Laterality Date  . APPLICATION OF CRANIAL NAVIGATION N/A 02/17/2015   Procedure: APPLICATION OF CRANIAL NAVIGATION;  Surgeon: Consuella Lose, MD;  Location: [facility];  Service: Neurosurgery;  Laterality: N/A;  APPLICATION OF CRANIAL NAVIGATION  . Bilateral cateract surgery    . COLONOSCOPY     colon polyp removed  . CRANIOTOMY Right 09/27/2013   Procedure: RIGHT FRONTAL CRANIOTOMY FOR TUMOR RESECTION ;  Surgeon: Consuella Lose, MD;  Location: [facility];  Service: Neurosurgery;  Laterality: Right;  . CRANIOTOMY Right 02/17/2015   Procedure: Right frontal parietal craniotomy for resection of meningioma with brainlab;  Surgeon: Consuella Lose, MD;  Location: [facility];  Service:  Neurosurgery;  Laterality: Right;  Right frontal parietal craniotomy for resection of meningioma with brainlab  . JOINT REPLACEMENT     right knee replacement  . LAPAROTOMY N/A 04/04/2016   Procedure: EXPLORATORY LAPAROTOMY, REPAIR OF GASTRIC PERFORATION WITH OMENTAL PATCH, PLACEMENT OF GASTROSTOMY TUBE;  Surgeon: Autumn Messing III, MD;  Location: [facility];  Service: General;  Laterality: N/A;  . PROSTATE BIOPSY    . TONSILLECTOMY     Agae 6-7  . VASECTOMY      Family History  Problem Relation Age of Onset  . Parkinsonism Mother   . Dementia Father     Social History:  reports that he has never smoked. He quit smokeless tobacco use about 42 years ago. His smokeless tobacco use included Chew. He reports that he does not drink alcohol or use drugs.  Allergies:  Allergies  Allergen Reactions  . Asa [Aspirin] Nausea Only  . Other Itching and Rash    Nuts  . Vicodin [Hydrocodone-Acetaminophen] Nausea Only    Medications: I have reviewed the patient's current medications.  Results for orders placed or performed during the hospital encounter of 05/06/16 (from the past 48 hour(s))  CBC with Differential/Platelet     Status: Abnormal   Collection Time: 05/06/16 11:53 PM  Result Value Ref Range   WBC 10.7 (H) 4.0 - 10.5 K/uL   RBC 4.59 4.22 - 5.81  MIL/uL   Hemoglobin 13.4 13.0 - 17.0 g/dL   HCT 42.4 39.0 - 52.0 %   MCV 92.4 78.0 - 100.0 fL   MCH 29.2 26.0 - 34.0 pg   MCHC 31.6 30.0 - 36.0 g/dL   RDW 18.4 (H) 11.5 - 15.5 %   Platelets 361 150 - 400 K/uL   Neutrophils Relative % 78 %   Neutro Abs 8.3 (H) 1.7 - 7.7 K/uL   Lymphocytes Relative 12 %   Lymphs Abs 1.3 0.7 - 4.0 K/uL   Monocytes Relative 7 %   Monocytes Absolute 0.8 0.1 - 1.0 K/uL   Eosinophils Relative 2 %   Eosinophils Absolute 0.2 0.0 - 0.7 K/uL   Basophils Relative 1 %   Basophils Absolute 0.1 0.0 - 0.1 K/uL  Comprehensive metabolic panel     Status: Abnormal   Collection Time: 05/06/16 11:53 PM  Result Value Ref  Range   Sodium 134 (L) 135 - 145 mmol/L   Potassium 3.7 3.5 - 5.1 mmol/L   Chloride 100 (L) 101 - 111 mmol/L   CO2 27 22 - 32 mmol/L   Glucose, Bld 101 (H) 65 - 99 mg/dL   BUN 19 6 - 20 mg/dL   Creatinine, Ser 0.96 0.61 - 1.24 mg/dL   Calcium 8.8 (L) 8.9 - 10.3 mg/dL   Total Protein 6.1 (L) 6.5 - 8.1 g/dL   Albumin 3.0 (L) 3.5 - 5.0 g/dL   AST 21 15 - 41 U/L   ALT 20 17 - 63 U/L   Alkaline Phosphatase 74 38 - 126 U/L   Total Bilirubin 0.6 0.3 - 1.2 mg/dL   GFR calc non Af Amer >60 >60 mL/min   GFR calc Af Amer >60 >60 mL/min    Comment: (NOTE) The eGFR has been calculated using the CKD EPI equation. This calculation has not been validated in all clinical situations. eGFR's persistently <60 mL/min signify possible Chronic Kidney Disease.    Anion gap 7 5 - 15  Protime-INR     Status: Abnormal   Collection Time: 05/06/16 11:53 PM  Result Value Ref Range   Prothrombin Time 19.1 (H) 11.4 - 15.2 seconds   INR 1.59   APTT     Status: Abnormal   Collection Time: 05/06/16 11:53 PM  Result Value Ref Range   aPTT 40 (H) 24 - 36 seconds    Comment:        IF BASELINE aPTT IS ELEVATED, SUGGEST PATIENT RISK ASSESSMENT BE USED TO DETERMINE APPROPRIATE ANTICOAGULANT THERAPY.   I-Stat CG4 Lactic Acid, ED     Status: None   Collection Time: 05/07/16 12:04 AM  Result Value Ref Range   Lactic Acid, Venous 0.92 0.5 - 1.9 mmol/L  Type and screen [place]     Status: None   Collection Time: 05/07/16  2:56 AM  Result Value Ref Range   ABO/RH(D) O POS    Antibody Screen NEG    Sample Expiration 05/10/2016   Hemoglobin     Status: Abnormal   Collection Time: 05/07/16  3:01 AM  Result Value Ref Range   Hemoglobin 12.4 (L) 13.0 - 17.0 g/dL  Hematocrit     Status: Abnormal   Collection Time: 05/07/16  3:01 AM  Result Value Ref Range   HCT 38.9 (L) 39.0 - 37.9 %  Basic metabolic panel     Status: Abnormal   Collection Time: 05/07/16  6:34 AM  Result Value Ref Range  Sodium 135 135 - 145 mmol/L   Potassium 3.4 (L) 3.5 - 5.1 mmol/L   Chloride 103 101 - 111 mmol/L   CO2 26 22 - 32 mmol/L   Glucose, Bld 92 65 - 99 mg/dL   BUN 16 6 - 20 mg/dL   Creatinine, Ser 0.84 0.61 - 1.24 mg/dL   Calcium 7.9 (L) 8.9 - 10.3 mg/dL   GFR calc non Af Amer >60 >60 mL/min   GFR calc Af Amer >60 >60 mL/min    Comment: (NOTE) The eGFR has been calculated using the CKD EPI equation. This calculation has not been validated in all clinical situations. eGFR's persistently <60 mL/min signify possible Chronic Kidney Disease.    Anion gap 6 5 - 15  Hemoglobin     Status: Abnormal   Collection Time: 05/07/16  6:34 AM  Result Value Ref Range   Hemoglobin 12.0 (L) 13.0 - 17.0 g/dL  Hematocrit     Status: Abnormal   Collection Time: 05/07/16  6:34 AM  Result Value Ref Range   HCT 37.8 (L) 39.0 - 52.0 %  Glucose, capillary     Status: None   Collection Time: 05/07/16  7:56 AM  Result Value Ref Range   Glucose-Capillary 89 65 - 99 mg/dL  Hemoglobin     Status: Abnormal   Collection Time: 05/07/16 10:21 AM  Result Value Ref Range   Hemoglobin 11.7 (L) 13.0 - 17.0 g/dL  Hematocrit     Status: Abnormal   Collection Time: 05/07/16 10:21 AM  Result Value Ref Range   HCT 37.6 (L) 39.0 - 52.0 %    No results found.  ROS to follow Blood pressure 121/64, pulse 73, temperature 97.6 F (36.4 C), temperature source Oral, resp. rate 17, height '5\' 6"'$  (1.676 m), weight 81.5 kg (179 lb 10.8 oz), SpO2 95 %. Physical Exampatient will be examined prior to endoscopy labs reviewed  Assessment/Plan: Multiple medical problems in a patient with recent perforated ulcer and surgery 1 month ago on blood thinners for DVT PE and hematemesis Plan: Since surgery he has okayed an endoscopy will proceed this afternoon to see if okay to resume blood thinners and help decide if he needs a filter or not  Ashaki Frosch E 05/07/2016, 11:55 AM

## 2016-05-07 NOTE — Progress Notes (Signed)
Pt reports has not used CPAP in over a year and a half.  Does not want to wear.  I have instructed primary RN to call MD to make him aware.  I will also let Resp. Therapy know. Isaiah Blakes [facility] ICU/SD Care Coordinator / Dirk Dress Rapid Response RN

## 2016-05-07 NOTE — Op Note (Signed)
[facility] Patient Name: [name] Procedure Date: 05/07/2016 MRN: [id number] Attending MD: Clarene Essex , MD Date of Birth: Jul 28, 1931 CSN: [id number] Age: 82 Admit Type: Inpatient Procedure:                Upper GI endoscopy Indications:              Coffee-ground emesis, Hematemesis Providers:                Clarene Essex, MD, Laverta Baltimore RN, RN, Cherylynn Ridges, Technician Referring MD:              Medicines:                Fentanyl 25 micrograms IV, Midazolam 4 mg IV Complications:            No immediate complications. Estimated Blood Loss:     Estimated blood loss: none. Procedure:                Pre-Anesthesia Assessment:                           - Prior to the procedure, a History and Physical                            was performed, and patient medications and                            allergies were reviewed. The patient's tolerance of                            previous anesthesia was also reviewed. The risks                            and benefits of the procedure and the sedation                            options and risks were discussed with the patient.                            All questions were answered, and informed consent                            was obtained. Prior Anticoagulants: The patient has                            taken Xarelto (rivaroxaban), last dose was 1 day                            prior to procedure. ASA Grade Assessment: II - A                            patient with mild systemic disease. After reviewing  the risks and benefits, the patient was deemed in                            satisfactory condition to undergo the procedure.                           After obtaining informed consent, the endoscope was                            passed under direct vision. Throughout the                            procedure, the patient's blood pressure, pulse, and             oxygen saturations were monitored continuously. The                            Endoscope was introduced through the mouth, and                            advanced to the third part of duodenum. The upper                            GI endoscopy was somewhat difficult due to abnormal                            anatomy and a J-shaped stomach which made pyloric                            intubation difficult. The patient tolerated the                            procedure well. Scope In: Scope Out: Findings:      A medium-sized hiatal hernia was present.      LA Grade C (one or more mucosal breaks continuous between tops of 2 or       more mucosal folds, less than 75% circumference) esophagitis with no       bleeding was found.      Evidence of an ulcer surgery were found in the greater curvature of the       stomach. This was characterized by ulceration.      The duodenal bulb, first portion of the duodenum, second portion of the       duodenum and third portion of the duodenum were normal.      The exam was otherwise without abnormality. Impression:               - Medium-sized hiatal hernia.                           - LA Grade C reflux esophagitis.                           - An ulcer surgery were found, characterized by  ulceration.                           - Normal duodenal bulb, first portion of the                            duodenum, second portion of the duodenum and third                            portion of the duodenum.                           - The examination was otherwise normal.                           - No specimens collected. Moderate Sedation:      Moderate (conscious) sedation was administered by the endoscopy nurse       and supervised by the endoscopist. The following parameters were       monitored: oxygen saturation, heart rate, blood pressure, respiratory       rate, EKG, adequacy of pulmonary ventilation, and response to  care. Recommendation:           - Patient has a contact number available for                            emergencies. The signs and symptoms of potential                            delayed complications were discussed with the                            patient. Return to normal activities tomorrow.                            Written discharge instructions were provided to the                            patient.                           - Clear liquid diet today. okay to resume blood                            thinners slowly advance diet tomorrow and use                            antireflux measures with head of bed elevated to                            30 at all times and have him sitting up at 90 for                            2 hours after eating                           -  Continue present medications.                           - Use Protonix (pantoprazole) 40 mg PO BID today.                           - Return to GI clinic PRN.                           - Telephone GI clinic if symptomatic PRN. Procedure Code(s):        --- Professional ---                           [phone], Esophagogastroduodenoscopy, flexible,                            transoral; diagnostic, including collection of                            specimen(s) by brushing or washing, when performed                            (separate procedure) Diagnosis Code(s):        --- Professional ---                           K44.9, Diaphragmatic hernia without obstruction or                            gangrene                           K21.0, Gastro-esophageal reflux disease with                            esophagitis                           K92.0, Hematemesis CPT copyright 2016 [employer]. All rights reserved. The codes documented in this report are preliminary and upon coder review may  be revised to meet current compliance requirements. Clarene Essex, MD 05/07/2016 2:29:45 PM This report has been  signed electronically. Number of Addenda: 0

## 2016-05-07 NOTE — Progress Notes (Signed)
Pt has refused CPAP at this time.  RT to monitor and assess as needed.

## 2016-05-07 NOTE — Progress Notes (Signed)
Patient arrived on 2W approximately at 0230. Patient has condom cath on and has not voided as of now. Patient wants to wait a little longer to see if he can void on his own. Bladder scan showed 331 cc urine. Will continue to monitor patient.

## 2016-05-07 NOTE — H&P (Signed)
History and Physical    [name] [id number] DOB: 1930/10/02 DOA: 05/06/2016  PCP:  Melinda Crutch, MD   Patient coming from: Nursing home  Chief Complaint: Hematemesis   HPI: [name] is a 82 y.o. male with medical history significant for meningioma status post resection and radiation now with recurrence on radiation, peptic ulcer disease with perforation in July requiring exploratory laparotomy and open repair, OSA, hypertension, iron deficiency anemia, and diagnosis of bilateral PEs with right heart strain 2 weeks ago now on Xarelto who presents with hematemesis. Patient was admitted with respiratory complaints 2 weeks ago, diagnosed with bilateral PEs with right heart strain, noted to have a left lower extremity DVT, and was started on Xarelto. He was discharged to a nursing home and had been doing well, apparently tolerating this Xarelto without incident into the development of hematemesis on 05/06/2016. Patient denies any associated abdominal pain or chest pain. He reports nausea followed by coffee-ground emesis, and then relief for 20-30 minutes before another episode. She has been taking twice a day Pepcid. He denies any fevers, chills, chest pain, palpitations, dyspnea, or cough. He denies any headaches, change in vision or hearing, loss of coordination, or focal numbness or weakness.  ED Course: Upon arrival to the ED, patient is found to be afebrile, saturating adequately on room air, heart rate in the 90s and blood pressure in the 100/60 range. CMP featured mild hyponatremia and hypochloremia and CBC is notable only for a leukocytosis to 10,700 with normal H&H. Lactic acid is less than 1 and INR is 1.59. Patient was started on normal saline infusion and given a 40 mg IV push of Protonix. He remained hemodynamically stable in the ED and will be observed on the stepdown unit overnight for ongoing evaluation and management of hematemesis in the setting of recent perforated peptic ulcer in  a patient on Xarelto.  Review of Systems:  All other systems reviewed and apart from HPI, are negative.  Past Medical History:  Diagnosis Date  . Anemia   . Aortic insufficiency 08/11/2015  . Basal cell cancer   . Brain tumor ([place])   . GERD (gastroesophageal reflux disease)   . Herniated disc    lumbar spine  . Hiatal hernia   . History of radiation therapy    back approximately 2010 prostate radiation under care of Dr. Valere Dross   . Hyperlipidemia   . Hypertension   . Meningioma ([place])    brain  . Nocturia   . Occult blood in stools   . OSA (obstructive sleep apnea) 10/28/2014   Moderate with AHI 21/hr  . Prostate cancer ([place])   . Restless leg   . Seizures ([place])     Past Surgical History:  Procedure Laterality Date  . APPLICATION OF CRANIAL NAVIGATION N/A 02/17/2015   Procedure: APPLICATION OF CRANIAL NAVIGATION;  Surgeon: Consuella Lose, MD;  Location: [facility] ORS;  Service: Neurosurgery;  Laterality: N/A;  APPLICATION OF CRANIAL NAVIGATION  . Bilateral cateract surgery    . COLONOSCOPY     colon polyp removed  . CRANIOTOMY Right 09/27/2013   Procedure: RIGHT FRONTAL CRANIOTOMY FOR TUMOR RESECTION ;  Surgeon: Consuella Lose, MD;  Location: [facility]UR[facility] OR[facility];  Service: Neurosurgery;  Laterality: Right;  . CRANIOTOMY Right 02/17/2015   Procedure: Right frontal parietal craniotomy for resection of meningioma with brainlab;  Surgeon: Consuella Lose, MD;  Location: [facility]UR[facility] OR[facility];  Service: Neurosurgery;  Laterality: Right;  Right frontal parietal craniotomy for resection of  meningioma with brainlab  . JOINT REPLACEMENT     right knee replacement  . LAPAROTOMY N/A 04/04/2016   Procedure: EXPLORATORY LAPAROTOMY, REPAIR OF GASTRIC PERFORATION WITH OMENTAL PATCH, PLACEMENT OF GASTROSTOMY TUBE;  Surgeon: Autumn Messing III, MD;  Location: [facility]S;  Service: General;  Laterality: N/A;  . PROSTATE BIOPSY    . TONSILLECTOMY     Agae 6-7  . VASECTOMY       reports that he has never smoked.  He quit smokeless tobacco use about 42 years ago. His smokeless tobacco use included Chew. He reports that he does not drink alcohol or use drugs.  Allergies  Allergen Reactions  . Asa [Aspirin] Nausea Only  . Other Itching and Rash    Nuts  . Vicodin [Hydrocodone-Acetaminophen] Nausea Only    Family History  Problem Relation Age of Onset  . Parkinsonism Mother   . Dementia Father      Prior to Admission medications   Medication Sig Start Date End Date Taking? Authorizing Provider  albuterol (PROVENTIL) (2.5 MG/3ML) 0.083% nebulizer solution Take 3 mLs (2.5 mg total) by nebulization every 4 (four) hours as needed for wheezing or shortness of breath. 04/15/16  Yes Earnstine Regal, PA-C  B Complex-C (B-COMPLEX WITH VITAMIN C) tablet Take 1 tablet by mouth daily.   Yes Historical Provider, MD  dexamethasone (DECADRON) 0.5 MG tablet Take 0.5 mg by mouth daily. Stop date 05/13/16   Yes Historical Provider, MD  famotidine (PEPCID) 20 MG tablet Take 1 tablet (20 mg total) by mouth 2 (two) times daily. 04/15/16  Yes Earnstine Regal, PA-C  guaiFENesin (ROBITUSSIN) 100 MG/5ML SOLN Take 5 mLs (100 mg total) by mouth every 4 (four) hours as needed for cough or to loosen phlegm. 04/30/16  Yes Domenic Polite, MD  lamoTRIgine (LAMICTAL) 100 MG tablet Take 1 tablet (100 mg total) by mouth 2 (two) times daily. 04/18/16  Yes Cameron Sprang, MD  levETIRAcetam (KEPPRA) 500 MG tablet 750mg  BID Patient taking differently: Take 750 mg by mouth 2 (two) times daily. 750mg  BID 04/30/16  Yes Domenic Polite, MD  multivitamin-lutein [facility]) CAPS capsule Take 1 capsule by mouth daily.   Yes Historical Provider, MD  ondansetron (ZOFRAN-ODT) 4 MG disintegrating tablet Take 1 tablet (4 mg total) by mouth every 6 (six) hours as needed for nausea. 04/15/16  Yes Earnstine Regal, PA-C  polyethylene glycol (MIRALAX / GLYCOLAX) packet Take 17 g by mouth daily. 04/30/16  Yes Domenic Polite, MD  Rivaroxaban (XARELTO) 15 MG  TABS tablet Take 1 tablet (15 mg total) by mouth 2 (two) times daily with a meal. 04/30/16 05/18/16 Yes Domenic Polite, MD  rOPINIRole (REQUIP) 3 MG tablet Take 1.5 mg by mouth 2 (two) times daily.   Yes Historical Provider, MD  senna-docusate (SENOKOT-S) 8.6-50 MG tablet Take 1 tablet by mouth 2 (two) times daily. 04/30/16  Yes Domenic Polite, MD  solifenacin (VESICARE) 5 MG tablet Take 5 mg by mouth every morning.   Yes Historical Provider, MD  vitamin B-12 (CYANOCOBALAMIN) 100 MCG tablet Take 100 mcg by mouth daily.   Yes Historical Provider, MD  acetaminophen (TYLENOL) 325 MG tablet Take 2 tablets (650 mg total) by mouth every 4 (four) hours as needed. 04/15/16 04/15/17  Earnstine Regal, PA-C  amoxicillin-clavulanate (AUGMENTIN) 875-125 MG tablet Take 1 tablet by mouth every 12 (twelve) hours. For 3days Patient not taking: Reported on 05/07/2016 04/30/16   Domenic Polite, MD  levofloxacin (LEVAQUIN) 500 MG tablet Take 500 mg by mouth daily.  Historical Provider, MD  rivaroxaban (XARELTO) 20 MG TABS tablet Take 1 tablet (20 mg total) by mouth daily with supper. Patient not taking: Reported on 05/07/2016 05/19/16   Domenic Polite, MD    Physical Exam: Vitals:   05/06/16 2311 05/06/16 2312  BP: 107/61   Pulse: 91   Resp: 18   Temp: 97.8 F (36.6 C)   TempSrc: Oral   SpO2: 97% 97%  Weight:  83 kg (183 lb)  Height:  5\' 6"  (1.676 m)      Constitutional: NAD, calm, actively vomiting coffee-ground material into emesis bag Eyes: PERTLA, lids and conjunctivae normal ENMT: Mucous membranes are moist. Posterior pharynx clear of any exudate or lesions.   Neck: normal, supple, no masses, no thyromegaly Respiratory: clear to auscultation bilaterally, no wheezing, no crackles. Normal respiratory effort.   Cardiovascular: S1 & S2 heard, regular rate and rhythm, soft systolic murmur at upper sternal borders. No significant JVD. Abdomen: No distension, no tenderness, no masses palpated. Bowel sounds  normal.  Musculoskeletal: no clubbing / cyanosis. No joint deformity upper and lower extremities. Normal muscle tone.  Skin: no significant rashes, lesions, ulcers. Warm, dry, well-perfused. Neurologic: CN 2-12 grossly intact. Sensation intact, DTR normal. Strength 5/5 in all 4 limbs.  Psychiatric: Normal judgment and insight. Alert and oriented x 3. Normal mood and affect.     Labs on Admission: I have personally reviewed following labs and imaging studies  CBC:  Recent Labs Lab 05/06/16 2353  WBC 10.7*  NEUTROABS 8.3*  HGB 13.4  HCT 42.4  MCV 92.4  PLT [id number]   Basic Metabolic Panel:  Recent Labs Lab 05/06/16 2353  NA 134*  K 3.7  CL 100*  CO2 27  GLUCOSE 101*  BUN 19  CREATININE 0.96  CALCIUM 8.8*   GFR: Estimated Creatinine Clearance: 59 mL/min (by C-G formula based on SCr of 0.96 mg/dL). Liver Function Tests:  Recent Labs Lab 05/06/16 2353  AST 21  ALT 20  ALKPHOS 74  BILITOT 0.6  PROT 6.1*  ALBUMIN 3.0*   No results for input(s): LIPASE, AMYLASE in the last 168 hours. No results for input(s): AMMONIA in the last 168 hours. Coagulation Profile:  Recent Labs Lab 05/06/16 2353  INR 1.59   Cardiac Enzymes: No results for input(s): CKTOTAL, CKMB, CKMBINDEX, TROPONINI in the last 168 hours. BNP (last 3 results) No results for input(s): PROBNP in the last 8760 hours. HbA1C: No results for input(s): HGBA1C in the last 72 hours. CBG: No results for input(s): GLUCAP in the last 168 hours. Lipid Profile: No results for input(s): CHOL, HDL, LDLCALC, TRIG, CHOLHDL, LDLDIRECT in the last 72 hours. Thyroid Function Tests: No results for input(s): TSH, T4TOTAL, FREET4, T3FREE, THYROIDAB in the last 72 hours. Anemia Panel: No results for input(s): VITAMINB12, FOLATE, FERRITIN, TIBC, IRON, RETICCTPCT in the last 72 hours. Urine analysis:    Component Value Date/Time   COLORURINE YELLOW 04/23/2016 1221   APPEARANCEUR TURBID (A) 04/23/2016 1221   LABSPEC  1.045 (H) 04/23/2016 1221   PHURINE 7.0 04/23/2016 1221   GLUCOSEU NEGATIVE 04/23/2016 1221   HGBUR NEGATIVE 04/23/2016 1221   BILIRUBINUR NEGATIVE 04/23/2016 1221   KETONESUR NEGATIVE 04/23/2016 1221   PROTEINUR 30 (A) 04/23/2016 1221   UROBILINOGEN 0.2 07/31/2014 1941   NITRITE NEGATIVE 04/23/2016 1221   LEUKOCYTESUR NEGATIVE 04/23/2016 1221   Sepsis Labs: @LABRCNTIP (procalcitonin:4,lacticidven:4) )No results found for this or any previous visit (from the past 240 hour(s)).   Radiological Exams on Admission: No results  found.  EKG: Not performed, will obtain as appropriate.   Assessment/Plan  1. Hematemesis  - Complicated case given recent history of perforated PUD and now on Xarelto for bilateral PE's   - Initial BP soft, has since stabilized with IVF  - Initial Hgb 13.4, will check H&H q4h  - 40 mg IV Protonix given in ED, will continue q12h  - Type and screen performed  - Hold Xarelto for now  - Monitor in stepdown unit overnight given potential for rapid worsening    2. PUD with perforation in July  - Pt suffered a perforated peptic ulcer last month and underwent open repair on 04/04/16  - Has been taking Protonix 40 mg BID at home; currently on IV PPI as above  - Concerned for acute UGIB as above   3. Bilateral PE's, LLE DVT  - Diagnosed earlier this month with rt-heart strain on CT  - LLE DVT was identified by [place]  - Pt has been taking Xarelto as prescribed; currently held while following H&H as above    4. Meningioma  - Had resection, followed by radiation, but unfortunately has a recurrence  - Currently under the care of oncology and rad onc and receiving radiation again  - Continue with Decadron per rad onc    5. OSA  - Continue CPAP qHS   6. Leukocytosis  - WBC 10,700 on admission with no fever or conspicuous source of infection - Likely secondary to Decadron use  - Culture if febrile     DVT prophylaxis: SCD's Code Status: Full  Family  Communication: Discussed with patient Disposition Plan: Observe in stepdown  Consults called: None Admission status: Observation    Vianne Bulls, MD [facility] Pager [phone]  If 7PM-7AM, please contact night-coverage [email or web address] Password [email or web address]  05/07/2016, 2:26 AM

## 2016-05-08 ENCOUNTER — Inpatient Hospital Stay (HOSPITAL_COMMUNITY): Payer: Medicare Other

## 2016-05-08 LAB — HEPARIN LEVEL (UNFRACTIONATED)
HEPARIN UNFRACTIONATED: 0.87 [IU]/mL — AB (ref 0.30–0.70)
HEPARIN UNFRACTIONATED: 1.26 [IU]/mL — AB (ref 0.30–0.70)

## 2016-05-08 LAB — BASIC METABOLIC PANEL
Anion gap: 4 — ABNORMAL LOW (ref 5–15)
BUN: 13 mg/dL (ref 6–20)
CHLORIDE: 106 mmol/L (ref 101–111)
CO2: 25 mmol/L (ref 22–32)
CREATININE: 0.79 mg/dL (ref 0.61–1.24)
Calcium: 8.3 mg/dL — ABNORMAL LOW (ref 8.9–10.3)
GFR calc non Af Amer: >60 mL/min (ref >60)
Glucose, Bld: 75 mg/dL (ref 65–99)
POTASSIUM: 3.8 mmol/L (ref 3.5–5.1)
SODIUM: 135 mmol/L (ref 135–145)

## 2016-05-08 LAB — CBC
HEMATOCRIT: 36.6 % — AB (ref 39.0–52.0)
Hemoglobin: 11.5 g/dL — ABNORMAL LOW (ref 13.0–17.0)
MCH: 28.7 pg (ref 26.0–34.0)
MCHC: 31.4 g/dL (ref 30.0–36.0)
MCV: 91.3 fL (ref 78.0–100.0)
PLATELETS: 316 10*3/uL (ref 150–400)
RBC: 4.01 MIL/uL — AB (ref 4.22–5.81)
RDW: 18.2 % — ABNORMAL HIGH (ref 11.5–15.5)
WBC: 8.3 10*3/uL (ref 4.0–10.5)

## 2016-05-08 LAB — GLUCOSE, CAPILLARY: Glucose-Capillary: 78 mg/dL (ref 65–99)

## 2016-05-08 LAB — APTT
APTT: 112 s — AB (ref 24–36)
aPTT: 67 seconds — ABNORMAL HIGH (ref 24–36)

## 2016-05-08 MED ORDER — LEVETIRACETAM 750 MG PO TABS
750.0000 mg | ORAL_TABLET | Freq: Two times a day (BID) | ORAL | Status: DC
  Administered 2016-05-08 – 2016-05-10 (×5): 750 mg via ORAL
  Filled 2016-05-08 (×7): qty 1

## 2016-05-08 MED ORDER — DM-GUAIFENESIN ER 30-600 MG PO TB12
1.0000 | ORAL_TABLET | Freq: Two times a day (BID) | ORAL | Status: DC
  Administered 2016-05-08 – 2016-05-10 (×5): 1 via ORAL
  Filled 2016-05-08 (×5): qty 1

## 2016-05-08 MED ORDER — MENTHOL 3 MG MT LOZG
1.0000 | LOZENGE | OROMUCOSAL | Status: DC | PRN
  Administered 2016-05-08 (×2): 3 mg via ORAL
  Filled 2016-05-08: qty 9

## 2016-05-08 MED ORDER — HEPARIN (PORCINE) IN NACL 100-0.45 UNIT/ML-% IJ SOLN
850.0000 [IU]/h | INTRAMUSCULAR | Status: DC
  Administered 2016-05-08: 1000 [IU]/h via INTRAVENOUS
  Filled 2016-05-08: qty 250

## 2016-05-08 NOTE — Progress Notes (Signed)
[name] 10:21 AM  Subjective: Patient without any signs of bleeding and no new complaints and wants to eat and other than his cough no complaints  Objective: Vital signs stable afebrile no acute distress lungs are clear heart regular rate and rhythm abdomen is soft nontender hemoglobin stable  Assessment: Erosive esophagitis and recent perforated ulcer status post surgery now on heparin drip  Plan: Okay to have soft solids antireflux measures continue pump inhibitors will ask my partner Dr. Oletta Lamas to look in on next week  [facility]  Pager [phone] After 5PM or if no answer call [phone]

## 2016-05-08 NOTE — Progress Notes (Signed)
ANTICOAGULATION CONSULT NOTE - Initial Consult  Pharmacy Consult for Heparin IV Indication: Hx PE, on PTA anticoag  Allergies  Allergen Reactions  . Asa [Aspirin] Nausea Only  . Other Itching and Rash    Nuts  . Vicodin [Hydrocodone-Acetaminophen] Nausea Only    Patient Measurements: Height: 5\' 6"  (167.6 cm) Weight: 175 lb 11.3 oz (79.7 kg) IBW/kg (Calculated) : 63.8 Heparin Dosing Weight: 80kg (TBW)  Vital Signs: Temp: 98.4 F (36.9 C) (08/20 0400) Temp Source: Oral (08/20 0400) BP: 118/63 (08/20 0600) Pulse Rate: 80 (08/20 0600)  Labs:  Recent Labs  05/06/16 2353  05/07/16 0634 05/07/16 1021 05/07/16 1528 05/08/16 0325  HGB 13.4  < > 12.0* 11.7* 11.5* 11.5*  HCT 42.4  < > 37.8* 37.6* 36.8* 36.6*  PLT 361  --   --   --   --  316  APTT 40*  --   --   --   --   --   LABPROT 19.1*  --   --   --   --   --   INR 1.59  --   --   --   --   --   CREATININE 0.96  --  0.84  --   --  0.79  < > = values in this interval not displayed.  Estimated Creatinine Clearance: 69.5 mL/min (by C-G formula based on SCr of 0.8 mg/dL).   Medical History: Past Medical History:  Diagnosis Date  . Anemia   . Aortic insufficiency 08/11/2015  . Basal cell cancer   . Brain tumor ([place])   . GERD (gastroesophageal reflux disease)   . Herniated disc    lumbar spine  . Hiatal hernia   . History of radiation therapy    back approximately 2010 prostate radiation under care of Dr. Valere Dross   . Hyperlipidemia   . Hypertension   . Meningioma ([place])    brain  . Nocturia   . Occult blood in stools   . OSA (obstructive sleep apnea) 10/28/2014   Moderate with AHI 21/hr  . Prostate cancer ([place])   . Restless leg   . Seizures ([place])     Medications:  Prescriptions Prior to Admission  Medication Sig Dispense Refill Last Dose  . albuterol (PROVENTIL) (2.5 MG/3ML) 0.083% nebulizer solution Take 3 mLs (2.5 mg total) by nebulization every 4 (four) hours as needed for wheezing or shortness of breath.  75 mL 12 Past Month at Unknown time  . B Complex-C (B-COMPLEX WITH VITAMIN C) tablet Take 1 tablet by mouth daily.   05/06/2016 at 0800  . dexamethasone (DECADRON) 0.5 MG tablet Take 0.5 mg by mouth daily. Stop date 05/13/16   05/06/2016 at 0800  . famotidine (PEPCID) 20 MG tablet Take 1 tablet (20 mg total) by mouth 2 (two) times daily.   Past Month at Unknown time  . guaiFENesin (ROBITUSSIN) 100 MG/5ML SOLN Take 5 mLs (100 mg total) by mouth every 4 (four) hours as needed for cough or to loosen phlegm.  0 Past Week at Unknown time  . lamoTRIgine (LAMICTAL) 100 MG tablet Take 1 tablet (100 mg total) by mouth 2 (two) times daily. 60 tablet 0 05/06/2016 at 0800  . levETIRAcetam (KEPPRA) 500 MG tablet 750mg  BID (Patient taking differently: Take 750 mg by mouth 2 (two) times daily. 750mg  BID)   05/06/2016 at 0800  . multivitamin-lutein (OCUVITE-LUTEIN) CAPS capsule Take 1 capsule by mouth daily.   05/06/2016 at 2000  . ondansetron (ZOFRAN-ODT) 4 MG  disintegrating tablet Take 1 tablet (4 mg total) by mouth every 6 (six) hours as needed for nausea. 20 tablet 0 Past Week at Unknown time  . polyethylene glycol (MIRALAX / GLYCOLAX) packet Take 17 g by mouth daily. 14 each 0 05/06/2016 at 0800  . Rivaroxaban (XARELTO) 15 MG TABS tablet Take 1 tablet (15 mg total) by mouth 2 (two) times daily with a meal.   05/06/2016 at 1700  . rOPINIRole (REQUIP) 3 MG tablet Take 1.5 mg by mouth 2 (two) times daily.   05/06/2016 at 0800  . senna-docusate (SENOKOT-S) 8.6-50 MG tablet Take 1 tablet by mouth 2 (two) times daily.   05/06/2016 at 2000  . solifenacin (VESICARE) 5 MG tablet Take 5 mg by mouth every morning.   05/06/2016 at 0800  . vitamin B-12 (CYANOCOBALAMIN) 100 MCG tablet Take 100 mcg by mouth daily.   05/06/2016 at 0800  . acetaminophen (TYLENOL) 325 MG tablet Take 2 tablets (650 mg total) by mouth every 4 (four) hours as needed. 30 tablet 0 Taking  . amoxicillin-clavulanate (AUGMENTIN) 875-125 MG tablet Take 1 tablet by  mouth every 12 (twelve) hours. For 3days (Patient not taking: Reported on 05/07/2016)   Completed Course at Unknown time  . levofloxacin (LEVAQUIN) 500 MG tablet Take 500 mg by mouth daily.   Completed Course at Unknown time  . [START ON 05/19/2016] rivaroxaban (XARELTO) 20 MG TABS tablet Take 1 tablet (20 mg total) by mouth daily with supper. (Patient not taking: Reported on 05/07/2016)   Not Taking at Unknown time   Scheduled:  . B-complex with vitamin C  1 tablet Oral Daily  . beta carotene w/minerals  1 tablet Oral Daily  . darifenacin  7.5 mg Oral Daily  . dexamethasone  0.5 mg Oral Daily  . famotidine  20 mg Oral BID  . lamoTRIgine  100 mg Oral BID  . levETIRAcetam  750 mg Intravenous BID  . pantoprazole  40 mg Oral BID AC  . rOPINIRole  1.5 mg Oral BID  . sodium chloride flush  3 mL Intravenous Q12H  . vitamin B-12  100 mcg Oral Daily    Assessment: 77 yoM presents with hematemesis. PMH HTN, iron deficiency anemia, PUD with perforation in July 2017 requiring surgical repair, and recent bilateral PE with R heart strain earlier this month, started on Xarelto. Per GI eval, evidence of ulcer repair on endoscopy but no evidence of active bleeding; OK to resume anticoagulation. MD would like to start heparin before transitioning back to PO anticoag.   Baseline INR/aPTT mildly elevated, likely d/t Xarelto  Prior anticoagulation: Xarelto 15 mg BID, started on 8/12; last dose 8/18 PM  Significant events:  Today, 05/08/2016:  CBC: Hgb lower but stable since yesterday, Plt wnl  No further bleeding or infusion issues per nursing  CrCl: 70 ml/min CG  Goal of Therapy: Heparin level 0.3-0.7 units/ml Monitor platelets by anticoagulation protocol: Yes  Plan:  Will omit heparin bolus given patient satting well on room air and recent GI bleed  Heparin 1000 units/hr IV infusion - based on heparin levels during recent admission  Check heparin level and aPTT 8 hrs after start; expect  initial heparin levels could be elevated due to residual anti-Xa effects of Xarelto  Daily CBC and heparin level; aPTT as needed until correlating with heparin level  Monitor for signs of bleeding or thrombosis   Reuel Boom, PharmD Pager: [phone] 05/08/2016, 7:32 AM

## 2016-05-08 NOTE — Progress Notes (Signed)
Attempted to reach patient's nurse at [facility] to find out what his diet was while there but after several phone transfers by their operator, my calls were never answered. Text page sent to Dr. Posey Pronto to inform him of above.

## 2016-05-08 NOTE — Progress Notes (Addendum)
Paged Dr. Posey Pronto x2 to change Keppra IV to PO. No response from MD.

## 2016-05-08 NOTE — Progress Notes (Signed)
[facility] Progress Note  Patient: [name] [id number]   PCP:  Melinda Crutch, MD DOB: 08-08-1931   DOA: 05/06/2016   DOS: 05/08/2016   Date of Service: the patient was seen and examined on 05/08/2016  Subjective: Denies any acute complaint. Vomiting. No further episodes of bleeding. Nutrition: Tolerating oral diet  Brief hospital course: Pt. with PMH of peptic ulcer disease, GERD, ASA, meningioma, DVT; admitted on 05/06/2016, with complaint of coffee-ground emesis, was found to have hematemesis. Currently further plan is to monitor H&H on anticoagulation.  Assessment and Plan: 1. Hematemesis Status post EGD which found erosive gastritis. Appreciate input from gastroenterology. Patient will be started on anticoagulation with heparin. The patient remained stable on heparin will resume Xarelto on discharge. Diet advance. Transfuse when necessary for hemoglobin less than 7 or hemodynamic instability. Continue to monitor on telemetry.  2.Bilateral PE's, LLE DVT  - Diagnosed earlier this month with rt-heart strain on CT  - LLE DVT was identified by [place]  - Pt has been taking Xarelto as prescribed; currently held while following H&H as above    3. Meningioma  - Had resection, followed by radiation, but unfortunately has a recurrence  - Currently under the care of oncology and rad onc and receiving radiation again  - Continue with Decadron per rad onc    4. OSA  - Continue CPAP qHS   5. Leukocytosis  - WBC 10,700 on admission with no fever or conspicuous source of infection - Likely secondary to Decadron use  - Culture if febrile     Pain management: Continue home regimen Activity: Consulted physical therapy Bowel regimen: last BM 05/08/2016 Diet: Mechanical soft diet nectar thick DVT Prophylaxis: on therapeutic anticoagulation.  Advance goals of care discussion: Full code  Family Communication: family was present at bedside, at the time of interview. The pt provided  permission to discuss medical plan with the family. Opportunity was given to ask question and all questions were answered satisfactorily.   Disposition:  Discharge to SNF. Expected discharge date: 05/09/2016, stabilization of hemoglobin while being on antiplatelet relation  Consultants: Gastroenterology Procedures: EGD  Antibiotics: Anti-infectives    None        Intake/Output Summary (Last 24 hours) at 05/08/16 1827 Last data filed at 05/08/16 1500  Gross per 24 hour  Intake           724.83 ml  Output              800 ml  Net           -75.17 ml   Filed Weights   05/06/16 2312 05/07/16 0307 05/08/16 0500  Weight: 83 kg (183 lb) 81.5 kg (179 lb 10.8 oz) 79.7 kg (175 lb 11.3 oz)    Objective: Physical Exam: Vitals:   05/08/16 0800 05/08/16 0900 05/08/16 1026 05/08/16 1447  BP: 118/75 134/64 98/61 120/70  Pulse: 82 86 84 76  Resp: (!) 23 19 20 20   Temp: 98.2 F (36.8 C)  98.6 F (37 C) 98.2 F (36.8 C)  TempSrc: Oral  Oral Oral  SpO2: 94% 97% 98% 99%  Weight:      Height:        General: Alert, Awake and Oriented to Time, Place and Person. Appear in mild distress Eyes: PERRL, Conjunctiva normal ENT: Oral Mucosa clear moist. Neck: no JVD, no Abnormal Mass Or lumps Cardiovascular: S1 and S2 Present, aortic systolic Murmur, Respiratory: Bilateral Air entry equal and Decreased, Clear to Auscultation, no  Crackles, no wheezes Abdomen: Bowel Sound present, Soft and no tenderness Skin: no redness, no Rash  Extremities: no Pedal edema, no calf tenderness Neurologic: Grossly no focal neuro deficit. Bilaterally Equal motor strength  Data Reviewed: CBC:  Recent Labs Lab 05/06/16 2353 05/07/16 0301 05/07/16 0634 05/07/16 1021 05/07/16 1528 05/08/16 0325  WBC 10.7*  --   --   --   --  8.3  NEUTROABS 8.3*  --   --   --   --   --   HGB 13.4 12.4* 12.0* 11.7* 11.5* 11.5*  HCT 42.4 38.9* 37.8* 37.6* 36.8* 36.6*  MCV 92.4  --   --   --   --  91.3  PLT 361  --   --    --   --  [id number]   Basic Metabolic Panel:  Recent Labs Lab 05/06/16 2353 05/07/16 0634 05/08/16 0325  NA 134* 135 135  K 3.7 3.4* 3.8  CL 100* 103 106  CO2 27 26 25   GLUCOSE 101* 92 75  BUN 19 16 13   CREATININE 0.96 0.84 0.79  CALCIUM 8.8* 7.9* 8.3*    Liver Function Tests:  Recent Labs Lab 05/06/16 2353  AST 21  ALT 20  ALKPHOS 74  BILITOT 0.6  PROT 6.1*  ALBUMIN 3.0*   No results for input(s): LIPASE, AMYLASE in the last 168 hours. No results for input(s): AMMONIA in the last 168 hours. Coagulation Profile:  Recent Labs Lab 05/06/16 2353  INR 1.59   Cardiac Enzymes: No results for input(s): CKTOTAL, CKMB, CKMBINDEX, TROPONINI in the last 168 hours. BNP (last 3 results) No results for input(s): PROBNP in the last 8760 hours.  CBG:  Recent Labs Lab 05/07/16 0756 05/08/16 0729  GLUCAP 89 78    Studies: Dg Chest Port 1 View  Result Date: 05/08/2016 CLINICAL DATA:  Short of breath. Cough. Pulmonary emboli on prior CT. EXAM: PORTABLE CHEST 1 VIEW COMPARISON:  Plain film and CT of 04/23/2016. FINDINGS: Midline trachea. Patient rotated right. Mild cardiomegaly. No pleural effusion or pneumothorax. Clear lungs. No congestive failure. IMPRESSION: No acute cardiopulmonary disease. Cardiomegaly without congestive failure. Electronically Signed   By: Abigail Miyamoto M.D.   On: 05/08/2016 11:06     Scheduled Meds: . B-complex with vitamin C  1 tablet Oral Daily  . beta carotene w/minerals  1 tablet Oral Daily  . darifenacin  7.5 mg Oral Daily  . dexamethasone  0.5 mg Oral Daily  . dextromethorphan-guaiFENesin  1 tablet Oral BID  . famotidine  20 mg Oral BID  . lamoTRIgine  100 mg Oral BID  . levETIRAcetam  750 mg Oral BID  . pantoprazole  40 mg Oral BID AC  . rOPINIRole  1.5 mg Oral BID  . sodium chloride flush  3 mL Intravenous Q12H  . vitamin B-12  100 mcg Oral Daily   Continuous Infusions: . heparin 1,000 Units/hr (05/08/16 1500)   PRN Meds: albuterol,  menthol-cetylpyridinium, ondansetron **OR** ondansetron (ZOFRAN) IV  Time spent: 30 minutes  Author: Berle Mull, MD [facility] Pager: [phone] 05/08/2016 6:27 PM  If 7PM-7AM, please contact night-coverage at [email or web address], password [facility]

## 2016-05-08 NOTE — Progress Notes (Signed)
Patient received from ICU. Agree with shift assessment. Patient A&Ox4. Oriented to room and unit. Call bell within reach. Bed alarm on.

## 2016-05-08 NOTE — NC FL2 (Signed)
[place] MEDICAID FL2 LEVEL OF CARE SCREENING TOOL     IDENTIFICATION  Patient Name: [name] Birthdate: [birth year] Sex: male Admission Date (Current Location): 05/06/2016  [facility] and [place] Number:  Herbalist and Address:  [facility] [place], [place] [address], [place], [place] [address]      Provider Number: [id number]  Attending Physician Name and Address:  Lavina Hamman, MD  Relative Name and Phone Number:       Current Level of Care: Hospital Recommended Level of Care: [place] Prior Approval Number:    Date Approved/Denied:   PASRR Number:    Discharge Plan: SNF    Current Diagnoses: Patient Active Problem List   Diagnosis Date Noted  . Hematemesis 05/07/2016  . Hypotension 05/07/2016  . PUD (peptic ulcer disease) 05/07/2016  . Anticoagulated by anticoagulation treatment   . History of pulmonary embolism   . Lactic acidosis 04/23/2016  . Leukocytosis 04/23/2016  . Nausea & vomiting 04/23/2016  . Nausea 04/23/2016  . Skin irritation 04/23/2016  . History of recent steroid use 04/23/2016  . Pulmonary embolus ([place]) 04/23/2016  . Cough 04/22/2016  . Acute delirium 04/08/2016  . Perforated bowel ([place]) 04/04/2016  . Humerus fracture 12/31/2015  . Closed right humeral fracture 12/31/2015  . Aortic insufficiency 08/11/2015  . 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 Atypical meningioma of brain ([place]) 02/17/2015  . Obesity (BMI 30-39.9) 02/13/2015  . OSA (obstructive sleep apnea) 10/28/2014  . SOB (shortness of breath) 07/31/2014  . Fatigue due to excessive exertion 07/31/2014  . Edema extremities 07/31/2014  . Hypersomnia 07/31/2014  . Meningioma, recurrent of brain ([place]) 09/27/2013  . Unspecified constipation 09/26/2013  . Anemia, iron deficiency 09/26/2013  . Dizziness  09/20/2013  . Fall at home 09/20/2013  . Dehydration 09/20/2013  . Orthostasis 09/20/2013  . CKD (chronic kidney disease), stage III 09/20/2013  . Brain mass 09/20/2013  . Gait instability 08/20/2013  . Hypokalemia 10/09/2012  . Ileus ([place]) 10/08/2012  . Acute kidney injury ([place]) 10/08/2012  . Hypertension 10/08/2012  . Hyperlipidemia 10/08/2012  . GERD (gastroesophageal reflux disease) 10/08/2012  . Restless leg syndrome 10/08/2012    Orientation RESPIRATION BLADDER Height & Weight     Self, Time, Situation, Place  Normal External catheter Weight: 79.7 kg (175 lb 11.3 oz) Height:  5\' 6"  (167.6 cm)  BEHAVIORAL SYMPTOMS/MOOD NEUROLOGICAL BOWEL NUTRITION STATUS      Continent Diet (see DC summary)  AMBULATORY STATUS COMMUNICATION OF NEEDS Skin   Extensive Assist Verbally Surgical wounds                       Personal Care Assistance Level of Assistance  Bathing, Dressing Bathing Assistance: Limited assistance   Dressing Assistance: Limited assistance     Functional Limitations Info             SPECIAL CARE FACTORS FREQUENCY  PT (By licensed PT), OT (By licensed OT)     PT Frequency: 5/wk OT Frequency: 5/wk            Contractures      Additional Factors Info  Code Status, Allergies Code Status Info: FULL Allergies Info: Asa Aspirin, Other, Vicodin Hydrocodone-acetaminophen           Current Medications (05/08/2016):  This is the current hospital active medication list Current Facility-Administered Medications  Medication Dose  Route Frequency Provider Last Rate Last Dose  . albuterol (PROVENTIL) (2.5 MG/3ML) 0.083% nebulizer solution 2.5 mg  2.5 mg Nebulization Q4H PRN Vianne Bulls, MD      . B-complex with vitamin C tablet 1 tablet  1 tablet Oral Daily Vianne Bulls, MD   1 tablet at 05/08/16 [phone]  . beta carotene w/minerals (OCUVITE) tablet 1 tablet  1 tablet Oral Daily Ilene Qua Opyd, MD      . darifenacin (ENABLEX) 24 hr tablet 7.5 mg  7.5  mg Oral Daily Vianne Bulls, MD   7.5 mg at 05/08/16 [id number]  . dexamethasone (DECADRON) tablet 0.5 mg  0.5 mg Oral Daily Vianne Bulls, MD   0.5 mg at 05/08/16 [id number]  . dextromethorphan-guaiFENesin (MUCINEX DM) 30-600 MG per 12 hr tablet 1 tablet  1 tablet Oral BID Lavina Hamman, MD      . famotidine (PEPCID) tablet 20 mg  20 mg Oral BID Clarene Essex, MD   20 mg at 05/08/16 [id number]  . heparin ADULT infusion 100 units/mL (25000 units/235mL sodium chloride 0.45%)  1,000 Units/hr Intravenous Continuous Polly Cobia, RPH 10 mL/hr at 05/08/16 0816 1,000 Units/hr at 05/08/16 0816  . lamoTRIgine (LAMICTAL) tablet 100 mg  100 mg Oral BID Vianne Bulls, MD   100 mg at 05/08/16 [id number]  . levETIRAcetam (KEPPRA) tablet 750 mg  750 mg Oral BID Lavina Hamman, MD      . menthol-cetylpyridinium (CEPACOL) lozenge 3 mg  1 lozenge Oral PRN Lavina Hamman, MD      . ondansetron [facility]) tablet 4 mg  4 mg Oral Q6H PRN Vianne Bulls, MD       Or  . ondansetron (ZOFRAN) injection 4 mg  4 mg Intravenous Q6H PRN Vianne Bulls, MD      . pantoprazole (PROTONIX) EC tablet 40 mg  40 mg Oral BID AC Clarene Essex, MD   40 mg at 05/08/16 [id number]  . rOPINIRole (REQUIP) tablet 1.5 mg  1.5 mg Oral BID Vianne Bulls, MD   1.5 mg at 05/08/16 [id number]  . sodium chloride flush (NS) 0.9 % injection 3 mL  3 mL Intravenous Q12H Vianne Bulls, MD   3 mL at 05/07/16 1048  . vitamin B-12 (CYANOCOBALAMIN) tablet 100 mcg  100 mcg Oral Daily Vianne Bulls, MD   100 mcg at 05/08/16 [id number]     Discharge Medications: Please see discharge summary for a list of discharge medications.  Relevant Imaging Results:  Relevant Lab Results:   Additional Information SS#: [id number]  Jorge Ny, LCSW

## 2016-05-08 NOTE — Clinical Social Work Note (Signed)
Clinical Social Work Assessment  Patient Details  Name: [name] MRN: [id number] Date of Birth: [birth year]  Date of referral:  05/08/16               Reason for consult:  Facility Placement                Permission sought to share information with:  Facility Sport and exercise psychologist, Family Supports Permission granted to share information::  Yes, Verbal Permission Granted  Name::     Art therapist::  SNFs  Relationship::  wife  Contact Information:     Housing/Transportation Living arrangements for the past 2 months:  [place], [place] of Information:  Patient Patient Interpreter Needed:  None Criminal Activity/Legal Involvement Pertinent to Current Situation/Hospitalization:  No - Comment as needed Significant Relationships:  Spouse Lives with:  Spouse Do you feel safe going back to the place where you live?  No Need for family participation in patient care:  No (Coment)  Care giving concerns:  Pt has concerns with [facility] caregiving and would like to look into alternate options.   Social Worker assessment / plan:  CSW met with pt wife and pt at bedside to discuss plan for time of DC.  Pt confirmed he was at [facility] prior to admission but is unsure if he would like to return due to concerns with caregiving there.  Pt wife present but defers to pt for decision making on this matter.  Employment status:  Retired Nurse, adult PT Recommendations:  Not assessed at this time [place] / Referral to community resources:  [place]  Patient/Family's Response to care:  Agreeable to go to a SNF but unsure if he is willing to return to [facility] at this time.  Patient/Family's Understanding of and Emotional Response to Diagnosis, Current Treatment, and Prognosis:  Pt hopeful he can be placed somewhere with sufficient caregiving to handle his current needs and aid in his return  home.  Emotional Assessment Appearance:  Appears stated age Attitude/Demeanor/Rapport:    Affect (typically observed):  Appropriate Orientation:  Oriented to Situation, Oriented to  Time, Oriented to Place, Oriented to Self Alcohol / Substance use:  Not Applicable Psych involvement (Current and /or in the community):  No (Comment)  Discharge Needs  Concerns to be addressed:  Care Coordination Readmission within the last 30 days:  Yes Current discharge risk:  Physical Impairment Barriers to Discharge:  Continued Medical Work up   Paul Ny, LCSW 05/08/2016, 12:38 PM

## 2016-05-08 NOTE — Progress Notes (Signed)
[place] for Heparin IV Indication: Hx PE, on PTA anticoag  Allergies  Allergen Reactions  . Asa [Aspirin] Nausea Only  . Other Itching and Rash    Nuts  . Vicodin [Hydrocodone-Acetaminophen] Nausea Only    Patient Measurements: Height: 5\' 6"  (167.6 cm) Weight: 175 lb 11.3 oz (79.7 kg) IBW/kg (Calculated) : 63.8 Heparin Dosing Weight: 80kg (TBW)  Vital Signs: Temp: 98.2 F (36.8 C) (08/20 1447) Temp Source: Oral (08/20 1447) BP: 120/70 (08/20 1447) Pulse Rate: 76 (08/20 1447)  Labs:  Recent Labs  05/06/16 2353  05/07/16 0634 05/07/16 1021 05/07/16 1528 05/08/16 0325 05/08/16 1601  HGB 13.4  < > 12.0* 11.7* 11.5* 11.5*  --   HCT 42.4  < > 37.8* 37.6* 36.8* 36.6*  --   PLT 361  --   --   --   --  316  --   APTT 40*  --   --   --   --   --  67*  LABPROT 19.1*  --   --   --   --   --   --   INR 1.59  --   --   --   --   --   --   HEPARINUNFRC  --   --   --   --   --   --  0.87*  CREATININE 0.96  --  0.84  --   --  0.79  --   < > = values in this interval not displayed.  Estimated Creatinine Clearance: 69.5 mL/min (by C-G formula based on SCr of 0.8 mg/dL).   Medical History: Past Medical History:  Diagnosis Date  . Anemia   . Aortic insufficiency 08/11/2015  . Basal cell cancer   . Brain tumor ([place])   . GERD (gastroesophageal reflux disease)   . Herniated disc    lumbar spine  . Hiatal hernia   . History of radiation therapy    back approximately 2010 prostate radiation under care of Dr. Valere Dross   . Hyperlipidemia   . Hypertension   . Meningioma ([place])    brain  . Nocturia   . Occult blood in stools   . OSA (obstructive sleep apnea) 10/28/2014   Moderate with AHI 21/hr  . Prostate cancer ([place])   . Restless leg   . Seizures ([place])     Medications:  Prescriptions Prior to Admission  Medication Sig Dispense Refill Last Dose  . albuterol (PROVENTIL) (2.5 MG/3ML) 0.083% nebulizer solution Take 3 mLs (2.5 mg total)  by nebulization every 4 (four) hours as needed for wheezing or shortness of breath. 75 mL 12 Past Month at Unknown time  . B Complex-C (B-COMPLEX WITH VITAMIN C) tablet Take 1 tablet by mouth daily.   05/06/2016 at 0800  . dexamethasone (DECADRON) 0.5 MG tablet Take 0.5 mg by mouth daily. Stop date 05/13/16   05/06/2016 at 0800  . famotidine (PEPCID) 20 MG tablet Take 1 tablet (20 mg total) by mouth 2 (two) times daily.   Past Month at Unknown time  . guaiFENesin (ROBITUSSIN) 100 MG/5ML SOLN Take 5 mLs (100 mg total) by mouth every 4 (four) hours as needed for cough or to loosen phlegm.  0 Past Week at Unknown time  . lamoTRIgine (LAMICTAL) 100 MG tablet Take 1 tablet (100 mg total) by mouth 2 (two) times daily. 60 tablet 0 05/06/2016 at 0800  . levETIRAcetam (KEPPRA) 500 MG tablet 750mg  BID (Patient taking differently:  Take 750 mg by mouth 2 (two) times daily. 750mg  BID)   05/06/2016 at 0800  . multivitamin-lutein (OCUVITE-LUTEIN) CAPS capsule Take 1 capsule by mouth daily.   05/06/2016 at 2000  . ondansetron (ZOFRAN-ODT) 4 MG disintegrating tablet Take 1 tablet (4 mg total) by mouth every 6 (six) hours as needed for nausea. 20 tablet 0 Past Week at Unknown time  . polyethylene glycol (MIRALAX / GLYCOLAX) packet Take 17 g by mouth daily. 14 each 0 05/06/2016 at 0800  . Rivaroxaban (XARELTO) 15 MG TABS tablet Take 1 tablet (15 mg total) by mouth 2 (two) times daily with a meal.   05/06/2016 at 1700  . rOPINIRole (REQUIP) 3 MG tablet Take 1.5 mg by mouth 2 (two) times daily.   05/06/2016 at 0800  . senna-docusate (SENOKOT-S) 8.6-50 MG tablet Take 1 tablet by mouth 2 (two) times daily.   05/06/2016 at 2000  . solifenacin (VESICARE) 5 MG tablet Take 5 mg by mouth every morning.   05/06/2016 at 0800  . vitamin B-12 (CYANOCOBALAMIN) 100 MCG tablet Take 100 mcg by mouth daily.   05/06/2016 at 0800  . acetaminophen (TYLENOL) 325 MG tablet Take 2 tablets (650 mg total) by mouth every 4 (four) hours as needed. 30 tablet 0  Taking  . amoxicillin-clavulanate (AUGMENTIN) 875-125 MG tablet Take 1 tablet by mouth every 12 (twelve) hours. For 3days (Patient not taking: Reported on 05/07/2016)   Completed Course at Unknown time  . levofloxacin (LEVAQUIN) 500 MG tablet Take 500 mg by mouth daily.   Completed Course at Unknown time  . [START ON 05/19/2016] rivaroxaban (XARELTO) 20 MG TABS tablet Take 1 tablet (20 mg total) by mouth daily with supper. (Patient not taking: Reported on 05/07/2016)   Not Taking at Unknown time   Scheduled:  . B-complex with vitamin C  1 tablet Oral Daily  . beta carotene w/minerals  1 tablet Oral Daily  . darifenacin  7.5 mg Oral Daily  . dexamethasone  0.5 mg Oral Daily  . dextromethorphan-guaiFENesin  1 tablet Oral BID  . famotidine  20 mg Oral BID  . lamoTRIgine  100 mg Oral BID  . levETIRAcetam  750 mg Oral BID  . pantoprazole  40 mg Oral BID AC  . rOPINIRole  1.5 mg Oral BID  . sodium chloride flush  3 mL Intravenous Q12H  . vitamin B-12  100 mcg Oral Daily    Assessment: 80 yoM presents with hematemesis. PMH HTN, iron deficiency anemia, PUD with perforation in July 2017 requiring surgical repair, and recent bilateral PE with R heart strain earlier this month, started on Xarelto. Per GI eval, evidence of ulcer repair on endoscopy but no evidence of active bleeding; OK to resume anticoagulation. MD would like to start heparin before transitioning back to PO anticoag.   Baseline INR/aPTT mildly elevated, likely d/t Xarelto  Prior anticoagulation: Xarelto 15 mg BID, started on 8/12; last dose 8/18 PM  Significant events:  Today, 05/08/2016:  First heparin level elevated as expected due to residual effects of Xarelto  Concurrent aPTT at low-end of goal  No bleeding or infusion issues per nursing  Goal of Therapy:  Heparin level 0.3-0.7 units/ml APTT 66-102 seconds Monitor platelets by anticoagulation protocol: Yes  Plan:  Continue heparin at current rate of 1000 units/hr  IV infusion  Check heparin level and aPTT in 6hrs  Daily CBC and heparin level; aPTT as needed until correlating with heparin level  Monitor for signs of bleeding or thrombosis  Peggyann Juba, PharmD, BCPS Pager: [phone] 05/08/2016, 4:52 PM

## 2016-05-09 ENCOUNTER — Encounter (HOSPITAL_COMMUNITY): Payer: Self-pay | Admitting: Gastroenterology

## 2016-05-09 LAB — COMPREHENSIVE METABOLIC PANEL
ALT: 18 U/L (ref 17–63)
AST: 22 U/L (ref 15–41)
Albumin: 2.6 g/dL — ABNORMAL LOW (ref 3.5–5.0)
Alkaline Phosphatase: 59 U/L (ref 38–126)
Anion gap: 6 (ref 5–15)
BUN: 9 mg/dL (ref 6–20)
CHLORIDE: 104 mmol/L (ref 101–111)
CO2: 25 mmol/L (ref 22–32)
CREATININE: 0.82 mg/dL (ref 0.61–1.24)
Calcium: 8.2 mg/dL — ABNORMAL LOW (ref 8.9–10.3)
GFR calc Af Amer: >60 mL/min (ref >60)
Glucose, Bld: 86 mg/dL (ref 65–99)
Potassium: 3.8 mmol/L (ref 3.5–5.1)
Sodium: 135 mmol/L (ref 135–145)
Total Bilirubin: 0.4 mg/dL (ref 0.3–1.2)
Total Protein: 4.8 g/dL — ABNORMAL LOW (ref 6.5–8.1)

## 2016-05-09 LAB — GLUCOSE, CAPILLARY: GLUCOSE-CAPILLARY: 87 mg/dL (ref 65–99)

## 2016-05-09 LAB — CBC
HEMATOCRIT: 36.6 % — AB (ref 39.0–52.0)
HEMOGLOBIN: 11.8 g/dL — AB (ref 13.0–17.0)
MCH: 29.4 pg (ref 26.0–34.0)
MCHC: 32.2 g/dL (ref 30.0–36.0)
MCV: 91 fL (ref 78.0–100.0)
PLATELETS: 299 10*3/uL (ref 150–400)
RBC: 4.02 MIL/uL — AB (ref 4.22–5.81)
RDW: 18 % — ABNORMAL HIGH (ref 11.5–15.5)
WBC: 7.2 10*3/uL (ref 4.0–10.5)

## 2016-05-09 MED ORDER — RIVAROXABAN 20 MG PO TABS: 20.0000 mg | ORAL_TABLET | Freq: Every day | ORAL | Status: DC

## 2016-05-09 MED ORDER — RIVAROXABAN 15 MG PO TABS
15.0000 mg | ORAL_TABLET | Freq: Two times a day (BID) | ORAL | Status: DC
Start: 2016-05-09 — End: 2016-05-11
  Administered 2016-05-09 – 2016-05-10 (×4): 15 mg via ORAL
  Filled 2016-05-09 (×4): qty 1

## 2016-05-09 NOTE — Progress Notes (Signed)
[facility] PROGRESS NOTE Subjective patient doing well and eating lunch no further bleedin  Objective: Vital signs in last 24 hours: Temp:  [97.8 F (36.6 C)-98.9 F (37.2 C)] 97.8 F (36.6 C) (08/21 0546) Pulse Rate:  [72-76] 76 (08/21 0546) Resp:  [18-20] 18 (08/21 0546) BP: (120-123)/(65-70) 123/65 (08/21 0546) SpO2:  [96 %-99 %] 96 % (08/21 0546) Weight:  [82.5 kg (181 lb 14.1 oz)] 82.5 kg (181 lb 14.1 oz) (08/21 0546) Last BM Date: 05/07/16  Intake/Output from previous day: 08/20 0701 - 08/21 0700 In: 456.9 [P.O.:240; I.V.:216.9] Out: 950 [Urine:950] Intake/Output this shift: No intake/output data recorded.  PE:  Abdomen-- nontender  Lab Results:  Recent Labs  05/06/16 2353  05/07/16 0634 05/07/16 1021 05/07/16 1528 05/08/16 0325 05/09/16 0540  WBC 10.7*  --   --   --   --  8.3 7.2  HGB 13.4  < > 12.0* 11.7* 11.5* 11.5* 11.8*  HCT 42.4  < > 37.8* 37.6* 36.8* 36.6* 36.6*  PLT 361  --   --   --   --  316 299  < > = values in this interval not displayed. BMET  Recent Labs  05/06/16 2353 05/07/16 0634 05/08/16 0325 05/09/16 0540  NA 134* 135 135 135  K 3.7 3.4* 3.8 3.8  CL 100* 103 106 104  CO2 27 26 25 25   CREATININE 0.96 0.84 0.79 0.82   LFT  Recent Labs  05/06/16 2353 05/09/16 0540  PROT 6.1* 4.8*  AST 21 22  ALT 20 18  ALKPHOS 74 59  BILITOT 0.6 0.4   PT/INR  Recent Labs  05/06/16 2353  LABPROT 19.1*  INR 1.59   PANCREAS No results for input(s): LIPASE in the last 72 hours.       Studies/Results: Dg Chest Port 1 View  Result Date: 05/08/2016 CLINICAL DATA:  Short of breath. Cough. Pulmonary emboli on prior CT. EXAM: PORTABLE CHEST 1 VIEW COMPARISON:  Plain film and CT of 04/23/2016. FINDINGS: Midline trachea. Patient rotated right. Mild cardiomegaly. No pleural effusion or pneumothorax. Clear lungs. No congestive failure. IMPRESSION: No acute cardiopulmonary disease. Cardiomegaly without congestive failure.  Electronically Signed   By: Abigail Miyamoto M.D.   On: 05/08/2016 11:06    Medications: I have reviewed the patient's current medications.  Assessment/Plan: 1. Hematemesis. Probably due to esophagitis. No further bleeding. Hemoglobin has come up. Feel he could be discharged home BID Protonix and follow-up in the future with Dr. Watt Climes as needed. We will sign off. Should be okay for discharge.   Destane Speas JR,Levent Kornegay L 05/09/2016, 1:25 PM  This note was created using voice recognition software. Minor errors may Have occurred unintentionally.  Pager: [phone] If no answer or after hours call [phone]

## 2016-05-09 NOTE — Consult Note (Signed)
[facility] Nurse wound consult note Reason for Consult:NPWT (VAC) dressing change Mon/Wed/Fri.  Old feeding tube takedown site.  Resolving.  Wound type:Surgical midline incision Feeding tube takedown site LUQ Pressure Ulcer POA: N/A Measurement:Midline abdomen  9.6 cm x 1.8 cm x 0.2 cm  Take down site:  0.5 cm diameter x 0.2cm Wound [id number] and moist Drainage (amount, consistency, odor) Minimal serosanguinous.  Periwound:Erythema noted, skin is sensitive to medical adhesive.  Dressing procedure/placement/frequency:Cleanse abdominal wound to midline abdomen with NS and pat gently dry. Fill wound bed with Granufoam.  Cover with drape. Seal immediately achieved at 125 mmHg.    Silicone border foam dressing to LUQ takedown site.  Will not follow at this time.  Please re-consult if needed.  Bedside nurse may change VAC dressing.  Domenic Moras RN BSN [place] Pager [phone]

## 2016-05-09 NOTE — Progress Notes (Signed)
ANTICOAGULATION CONSULT NOTE - Follow Up Consult  Pharmacy Consult for Heparin - transition back to Xarelto 8/21 Indication: Hx PE, on PTA Xarelto  Allergies  Allergen Reactions  . Asa [Aspirin] Nausea Only  . Other Itching and Rash    Nuts  . Vicodin [Hydrocodone-Acetaminophen] Nausea Only    Patient Measurements: Height: 5\' 6"  (167.6 cm) Weight: 181 lb 14.1 oz (82.5 kg) IBW/kg (Calculated) : 63.8 Heparin Dosing Weight:   Vital Signs: Temp: 97.8 F (36.6 C) (08/21 0546) Temp Source: Oral (08/21 0546) BP: 123/65 (08/21 0546) Pulse Rate: 76 (08/21 0546)  Labs:  Recent Labs  05/06/16 2353  05/07/16 0634  05/07/16 1528 05/08/16 0325 05/08/16 1601 05/08/16 2250 05/09/16 0540  HGB 13.4  < > 12.0*  < > 11.5* 11.5*  --   --  11.8*  HCT 42.4  < > 37.8*  < > 36.8* 36.6*  --   --  36.6*  PLT 361  --   --   --   --  316  --   --  299  APTT 40*  --   --   --   --   --  67* 112*  --   LABPROT 19.1*  --   --   --   --   --   --   --   --   INR 1.59  --   --   --   --   --   --   --   --   HEPARINUNFRC  --   --   --   --   --   --  0.87* 1.26*  --   CREATININE 0.96  --  0.84  --   --  0.79  --   --  0.82  < > = values in this interval not displayed.  Estimated Creatinine Clearance: 68.8 mL/min (by C-G formula based on SCr of 0.82 mg/dL).   Medications:  Infusions:     Assessment: 60 yoM presents with hematemesis. PMH HTN, iron deficiency anemia, PUD with perforation in July 2017 requiring surgical repair, and recent bilateral PE with R heart strain earlier this month, started on Xarelto. Per GI eval, evidence of ulcer repair on endoscopy but no evidence of active bleeding; OK to resume anticoagulation. MD would like to start heparin before transitioning back to PO anticoag.   Baseline INR/aPTT mildly elevated, likely d/t Xarelto  Prior anticoagulation: Xarelto 15 mg BID, started on 8/12; last dose 8/18 PM with plan to transition to 20mg  PO once daily beginning  8/31  Today, 05/09/16  To discontinue IV heparin this AM and restart Xarelto.   CBC stable, no reported bleeding  Goal of Therapy:  Heparin level 0.3-0.7 units/ml aPTT 66-102 seconds Monitor platelets by anticoagulation protocol: Yes   Plan:  1) Discontinue IV heparin now 2) Restart Xarelto 15mg  PO BID as per home meds PTA right when IV heparin stopped. Keep original transition date to 20mg  once daily on 8/31  Adrian Saran, PharmD, BCPS Pager [phone] 05/09/2016 8:21 AM

## 2016-05-09 NOTE — Progress Notes (Signed)
I took over care for this patient at 1700.  I agree with the previous nurses assessment.  Will continue to monitor closely.

## 2016-05-09 NOTE — Progress Notes (Signed)
Pt scheduled for MBS next date at 8 am to determine readiness for dietary advancement in liquids.  Xray, RN and MD made aware and SLP requested RN inform pt.  Thanks.  Luanna Salk, [facility] SLP     [phone]

## 2016-05-09 NOTE — Progress Notes (Signed)
BSE completed, full report to follow.  Pt familiar to this SLP from prior admission. Chronic cough reported prior to July surgery per spouse but exacerbated with po intake per spouse.  Wet voice noted intermittently without pt awareness, cues to clear throat/cough effective but does recur.  Recommend continue dys3/nectar with precautions.  Pt dislikes nectar liquids but states she does not mind thickened CRANBERRY juice.  Pt will benefit from repeat instrumental swallow evaluation prior to liquid advancement due to silent nature of dysphagia.  Reviewed prior MBS and compensation strategies with pt and wife using teach back and written instructions.  Luanna Salk, [facility] SLP [phone] .

## 2016-05-09 NOTE — Progress Notes (Signed)
ANTICOAGULATION CONSULT NOTE - Follow Up Consult  Pharmacy Consult for Heparin Indication: Hx PE, on PTA anticoag  Allergies  Allergen Reactions  . Asa [Aspirin] Nausea Only  . Other Itching and Rash    Nuts  . Vicodin [Hydrocodone-Acetaminophen] Nausea Only    Patient Measurements: Height: 5\' 6"  (167.6 cm) Weight: 175 lb 11.3 oz (79.7 kg) IBW/kg (Calculated) : 63.8 Heparin Dosing Weight:   Vital Signs: Temp: 98.9 F (37.2 C) (08/20 2115) Temp Source: Oral (08/20 2115) BP: 123/69 (08/20 2115) Pulse Rate: 72 (08/20 2115)  Labs:  Recent Labs  05/06/16 2353  05/07/16 0634 05/07/16 1021 05/07/16 1528 05/08/16 0325 05/08/16 1601 05/08/16 2250  HGB 13.4  < > 12.0* 11.7* 11.5* 11.5*  --   --   HCT 42.4  < > 37.8* 37.6* 36.8* 36.6*  --   --   PLT 361  --   --   --   --  316  --   --   APTT 40*  --   --   --   --   --  67* 112*  LABPROT 19.1*  --   --   --   --   --   --   --   INR 1.59  --   --   --   --   --   --   --   HEPARINUNFRC  --   --   --   --   --   --  0.87* 1.26*  CREATININE 0.96  --  0.84  --   --  0.79  --   --   < > = values in this interval not displayed.  Estimated Creatinine Clearance: 69.5 mL/min (by C-G formula based on SCr of 0.8 mg/dL).   Medications:  Infusions:  . heparin 1,000 Units/hr (05/08/16 1500)    Assessment: Patient with heparin level and PTT high.  I don't feel that they are correlating 100% at this time and will monitor both for the next level.  PTT ordered with Heparin level until both correlate due to possible drug-lab interaction between oral anticoagulant (rivaroxaban, edoxaban, or apixaban) and anti-Xa level (aka heparin level)   Goal of Therapy:  Heparin level 0.3-0.7 units/ml aPTT 66-102 seconds Monitor platelets by anticoagulation protocol: Yes   Plan:  Decrease heparin to 850 units/hr Recheck heparin level and PTT at 0900  Tyler Deis, [facility] [place] 05/09/2016,12:03 AM

## 2016-05-09 NOTE — Evaluation (Signed)
Physical Therapy Evaluation Patient Details Name: [name] MRN: [id number] DOB: [birth year] Today's Date: 05/09/2016   History of Present Illness  Pt is an 82 year old male with PMH of peptic ulcer disease, GERD, ASA, meningioma, DVT, previous admission s/p ex lap. Pt admitted on 05/06/2016, with complaint of coffee-ground emesis, was found to have hematemesis  Clinical Impression  Pt admitted with above diagnosis. Pt currently with functional limitations due to the deficits listed below (see PT Problem List).  Pt will benefit from skilled PT to increase their independence and safety with mobility to allow discharge to the venue listed below.  Pt requiring increased assist to perform bed mobility and stand.  Recommend pt return to SNF for rehab upon d/c.     Follow Up Recommendations SNF    Equipment Recommendations  None recommended by PT    Recommendations for Other Services       Precautions / Restrictions Precautions Precautions: Fall Precaution Comments: NPWT midline abdomen      Mobility  Bed Mobility Overal bed mobility: Needs Assistance Bed Mobility: Supine to Sit;Sit to Supine     Supine to sit: Max assist Sit to supine: Total assist;+2 for physical assistance   General bed mobility comments: assist for lower and upper body, utilized bed pad for positioning  Transfers Overall transfer level: Needs assistance Equipment used: Rolling walker (2 wheeled) Transfers: Sit to/from Stand Sit to Stand: Max assist;From elevated surface;+2 physical assistance         General transfer comment: increased assist to stand, steady, and control descent, pt very weak and requiring increased assist, strong posterior lean against bed and unable to correct  Ambulation/Gait Ambulation/Gait assistance:  (deferred for safety)              Stairs            Wheelchair Mobility    Modified Rankin (Stroke Patients Only)       Balance                                              Pertinent Vitals/Pain Pain Assessment: No/denies pain Faces Pain Scale: Hurts a little bit    Home Living Family/patient expects to be discharged to:: Skilled nursing facility                 Additional Comments: multiple recent admission, has been at SNF for rehab    Prior Function Level of Independence: Needs assistance   Gait / Transfers Assistance Needed: unable currently; was not progressing with PT at SNF due to orthostasis/dizziness, was just beginning to stand with +2 assist prior to this admission           Hand Dominance   Dominant Hand: Right    Extremity/Trunk Assessment   Upper Extremity Assessment: Generalized weakness           Lower Extremity Assessment: Generalized weakness;RLE deficits/detail;LLE deficits/detail RLE Deficits / Details: grossly 2+/5 throughout LLE Deficits / Details: grossly 2+/5 throughout  Cervical / Trunk Assessment: Kyphotic  Communication   Communication: No difficulties  Cognition Arousal/Alertness: Awake/alert Behavior During Therapy: WFL for tasks assessed/performed Overall Cognitive Status: Within Functional Limits for tasks assessed                      General Comments      Exercises  Assessment/Plan    PT Assessment Patient needs continued PT services  PT Diagnosis Difficulty walking;Generalized weakness   PT Problem List Decreased strength;Decreased range of motion;Decreased activity tolerance;Decreased balance;Decreased mobility;Decreased knowledge of use of DME  PT Treatment Interventions DME instruction;Gait training;Functional mobility training;Therapeutic activities;Therapeutic exercise;Patient/family education;Balance training;Wheelchair mobility training   PT Goals (Current goals can be found in the Care Plan section) Acute Rehab PT Goals PT Goal Formulation: With patient Time For Goal Achievement: 05/16/16 Potential to Achieve Goals: Fair     Frequency Min 3X/week   Barriers to discharge        Co-evaluation               End of Session Equipment Utilized During Treatment: Gait belt Activity Tolerance: Patient limited by fatigue Patient left: in bed;with call bell/phone within reach;with bed alarm set Nurse Communication: Mobility status         Time: [id number] PT Time Calculation (min) (ACUTE ONLY): 13 min   Charges:   PT Evaluation $PT Eval Moderate Complexity: 1 Procedure     PT G Codes:        Seann Genther,KATHrine E 05/09/2016, 4:32 PM Carmelia Bake, PT, DPT 05/09/2016 Pager: [phone]

## 2016-05-09 NOTE — Evaluation (Addendum)
Clinical/Bedside Swallow Evaluation Patient Details  Name: [name] MRN: [id number] Date of Birth: [birth year]  Today's Date: 05/09/2016 Time: SLP Start Time (ACUTE ONLY): 1150 SLP Stop Time (ACUTE ONLY): 1225 SLP Time Calculation (min) (ACUTE ONLY): 35 min  Past Medical History:  Past Medical History:  Diagnosis Date  . Anemia   . Aortic insufficiency 08/11/2015  . Basal cell cancer   . Brain tumor ([place])   . GERD (gastroesophageal reflux disease)   . Herniated disc    lumbar spine  . Hiatal hernia   . History of radiation therapy    back approximately 2010 prostate radiation under care of Dr. Valere Dross   . Hyperlipidemia   . Hypertension   . Meningioma ([place])    brain  . Nocturia   . Occult blood in stools   . OSA (obstructive sleep apnea) 10/28/2014   Moderate with AHI 21/hr  . Prostate cancer ([place])   . Restless leg   . Seizures ([place])    Past Surgical History:  Past Surgical History:  Procedure Laterality Date  . APPLICATION OF CRANIAL NAVIGATION N/A 02/17/2015   Procedure: APPLICATION OF CRANIAL NAVIGATION;  Surgeon: Consuella Lose, MD;  Location: [place] [facility];  Service: Neurosurgery;  Laterality: N/A;  APPLICATION OF CRANIAL NAVIGATION  . Bilateral cateract surgery    . COLONOSCOPY     colon polyp removed  . CRANIOTOMY Right 09/27/2013   Procedure: RIGHT FRONTAL CRANIOTOMY FOR TUMOR RESECTION ;  Surgeon: Consuella Lose, MD;  Location: [place] [facility] OR[facility];  Service: Neurosurgery;  Laterality: Right;  . CRANIOTOMY Right 02/17/2015   Procedure: Right frontal parietal craniotomy for resection of meningioma with brainlab;  Surgeon: Consuella Lose, MD;  Location: [place] [facility]UR[facility] ORS;  Service: Neurosurgery;  Laterality: Right;  Right frontal parietal craniotomy for resection of meningioma with brainlab  . JOINT REPLACEMENT     right knee replacement  . LAPAROTOMY N/A 04/04/2016   Procedure: EXPLORATORY LAPAROTOMY, REPAIR OF GASTRIC PERFORATION WITH OMENTAL PATCH, PLACEMENT OF  GASTROSTOMY TUBE;  Surgeon: Autumn Messing III, MD;  Location: [facility] OR[facility];  Service: General;  Laterality: N/A;  . PROSTATE BIOPSY    . TONSILLECTOMY     Agae 6-7  . VASECTOMY     HPI:  82 yo male adm to [facility] with coffee ground emesis and abdomen pain.  Pt with PMH + for abdominal pain s/p GI surgery with PEG placed 7/17 (since removed).  Pt was noted to cough with liquids after surgery in July 2017 and was placed on nectar thick liquids- which he has been consuming since that time.  PMH + for meningioma s/p XRT and surgery, anemia, seizures.  Mr Gellert discharged to SNF before this readmit.  Swallow evaluation ordered.     Assessment / Plan / Recommendation Clinical Impression  Pt presents with ongoing symptoms of dysphagia c/b wet voice and coughing with intake.   He is familiar from prior admit, when spouse reports coughing with intake worsened since surgery.   Wet voice noted at baseline and during intake - concerning for SILENT laryngeal penetration as observed on MBS in July 2017.  Recommend to continue dys3/nectar diet for now - pushing fluids as pt admits to displeasure with nectar.  He does report he enjoys nectar cranberry juice.  Also recommend he undergo instrumental swallow evaluation prior to dietary advancement due to his silent nature of dysphagia.  Suspect chronic dysphagia and mitigation strategies indicated.  Pt and wife agreed to plan after SLP reviewed prior recommendation and  explained purpose of compensations/diet.  Thanks for this order.     Aspiration Risk  Risk for inadequate nutrition/hydration;Moderate aspiration risk    Diet Recommendation Nectar-thick liquid;Dysphagia 3 (Mech soft)   Liquid Administration via: Cup Medication Administration: Whole meds with puree (whole if small) Supervision: Patient able to self feed;Full supervision/cueing for compensatory strategies Compensations: Slow rate;Small sips/bites;Follow solids with liquid;Clear throat after each swallow;Other  (Comment) (cough/expectorate prn)    Other  Recommendations Oral Care Recommendations: Oral care BID Other Recommendations: Order thickener from pharmacy   Follow up Recommendations  Skilled Nursing facility (TBD)    Frequency and Duration            Prognosis Prognosis for Safe Diet Advancement: Good      Swallow Study   General Date of Onset: 05/09/16 HPI: 82 yo male adm to [facility] with coffee ground emesis and abdomen pain.  Pt with PMH + for abdominal pain s/p GI surgery with PEG placed 7/17 (since removed).  Pt was noted to cough with liquids after surgery in July 2017 and was placed on nectar thick liquids- which he has been consuming since that time.  PMH + for meningioma s/p XRT and surgery, anemia, seizures.  Mr Flamenco discharged to SNF before this readmit.  Swallow evaluation ordered.   Type of Study: Bedside Swallow Evaluation Diet Prior to this Study: Dysphagia 3 (soft);Nectar-thick liquids Temperature Spikes Noted: No Respiratory Status: Room air History of Recent Intubation: No Behavior/Cognition: Alert;Cooperative;Pleasant mood Oral Cavity Assessment: Within Functional Limits Oral Care Completed by SLP: No Oral Cavity - Dentition: Adequate natural dentition Vision: Functional for self-feeding Self-Feeding Abilities: Able to feed self Patient Positioning: Upright in bed Baseline Vocal Quality: Wet (voice is wet and pt required verbal cues to cough to clear) Volitional Cough: Strong Volitional Swallow: Able to elicit    Oral/Motor/Sensory Function Overall Oral Motor/Sensory Function: Moderate impairment   Ice Chips Ice chips: Not tested Other Comments: pt reports he dislikes ice in general   Thin Liquid Thin Liquid: Impaired Presentation: Cup Pharyngeal  Phase Impairments: Wet Vocal Quality    Nectar Thick Nectar Thick Liquid: Impaired Presentation: Cup;Self Fed Pharyngeal Phase Impairments: Throat Clearing - Delayed   Honey Thick Honey Thick Liquid: Not tested    Puree Puree: Not tested   Solid   GO   Solid: Impaired Presentation: Self Fed;Spoon Pharyngeal Phase Impairments: Multiple swallows;Cough - Delayed;Wet Vocal Quality        Macario Golds 05/09/2016,3:21 PM

## 2016-05-09 NOTE — Progress Notes (Signed)
[facility] Progress Note  Patient: [name] [id number]   PCP:  Melinda Crutch, MD DOB: [birth year]   DOA: 05/06/2016   DOS: 05/09/2016   Date of Service: the patient was seen and examined on 05/09/2016  Subjective: Cough is still present. Patient has not used lozenges. Denies any chest pain abdominal pain nausea or vomiting. Nutrition: Tolerating oral diet  Brief hospital course: Pt. with PMH of peptic ulcer disease, GERD, ASA, meningioma, DVT; admitted on 05/06/2016, with complaint of coffee-ground emesis, was found to have hematemesis. Currently further plan is to monitor H&H on anticoagulation.  Assessment and Plan: 1. Hematemesis Status post EGD which found erosive gastritis. Appreciate input from gastroenterology. The patient remained stable on heparin will resume Xarelto. Diet advance. Transfuse when necessary for hemoglobin less than 7 or hemodynamic instability. Continue to monitor on telemetry.  2.Bilateral PE's, LLE DVT  - Diagnosed earlier this month with rt-heart strain on CT  - LLE DVT was identified by [place]  - Pt has been taking Xarelto as prescribed;   3. Meningioma  - Had resection, followed by radiation, but unfortunately has a recurrence  - Currently under the care of oncology and rad onc and receiving radiation again  - Continue with Decadron per rad onc    4. OSA  - Continue CPAP qHS   5. Leukocytosis  - WBC 10,700 on admission with no fever or conspicuous source of infection - Likely secondary to Decadron use  - Culture if febrile    6. Dysphagia. Speech therapy evaluation recommends MBS, scheduled on 05/10/2016. We'll monitor.   Pain management: Continue home regimen Activity: Consulted physical therapy Bowel regimen: last BM 05/08/2016 Diet: Mechanical soft diet nectar thick DVT Prophylaxis: on therapeutic anticoagulation.  Advance goals of care discussion: Full code  Family Communication: family was present at bedside, at the time of  interview. The pt provided permission to discuss medical plan with the family. Opportunity was given to ask question and all questions were answered satisfactorily.   Disposition:  Discharge to SNF. Expected discharge date: 05/10/2016, after MBS  Consultants: Gastroenterology speech therapy Procedures: EGD  Antibiotics: Anti-infectives    None        Intake/Output Summary (Last 24 hours) at 05/09/16 1815 Last data filed at 05/09/16 1549  Gross per 24 hour  Intake           629.58 ml  Output             1550 ml  Net          -920.42 ml   Filed Weights   05/07/16 0307 05/08/16 0500 05/09/16 0546  Weight: 81.5 kg (179 lb 10.8 oz) 79.7 kg (175 lb 11.3 oz) 82.5 kg (181 lb 14.1 oz)    Objective: Physical Exam: Vitals:   05/08/16 1447 05/08/16 2115 05/09/16 0546 05/09/16 1500  BP: 120/70 123/69 123/65 119/62  Pulse: 76 72 76 82  Resp: 20 18 18 18   Temp: 98.2 F (36.8 C) 98.9 F (37.2 C) 97.8 F (36.6 C) 98.4 F (36.9 C)  TempSrc: Oral Oral Oral Oral  SpO2: 99% 98% 96% 97%  Weight:   82.5 kg (181 lb 14.1 oz)   Height:        General: Alert, Awake and Oriented to Time, Place and Person. Appear in mild distress Eyes: PERRL, Conjunctiva normal ENT: Oral Mucosa clear moist. Neck: no JVD, no Abnormal Mass Or lumps Cardiovascular: S1 and S2 Present, aortic systolic Murmur, Respiratory: Bilateral Air entry equal  and Decreased, Clear to Auscultation, no Crackles, no wheezes Abdomen: Bowel Sound present, Soft and no tenderness Skin: no redness, no Rash  Extremities: no Pedal edema, no calf tenderness Neurologic: Grossly no focal neuro deficit. Bilaterally Equal motor strength  Data Reviewed: CBC:  Recent Labs Lab 05/06/16 2353  05/07/16 0634 05/07/16 1021 05/07/16 1528 05/08/16 0325 05/09/16 0540  WBC 10.7*  --   --   --   --  8.3 7.2  NEUTROABS 8.3*  --   --   --   --   --   --   HGB 13.4  < > 12.0* 11.7* 11.5* 11.5* 11.8*  HCT 42.4  < > 37.8* 37.6* 36.8* 36.6*  36.6*  MCV 92.4  --   --   --   --  91.3 91.0  PLT 361  --   --   --   --  316 299  < > = values in this interval not displayed. Basic Metabolic Panel:  Recent Labs Lab 05/06/16 2353 05/07/16 0634 05/08/16 0325 05/09/16 0540  NA 134* 135 135 135  K 3.7 3.4* 3.8 3.8  CL 100* 103 106 104  CO2 27 26 25 25   GLUCOSE 101* 92 75 86  BUN 19 16 13 9   CREATININE 0.96 0.84 0.79 0.82  CALCIUM 8.8* 7.9* 8.3* 8.2*    Liver Function Tests:  Recent Labs Lab 05/06/16 2353 05/09/16 0540  AST 21 22  ALT 20 18  ALKPHOS 74 59  BILITOT 0.6 0.4  PROT 6.1* 4.8*  ALBUMIN 3.0* 2.6*   No results for input(s): LIPASE, AMYLASE in the last 168 hours. No results for input(s): AMMONIA in the last 168 hours. Coagulation Profile:  Recent Labs Lab 05/06/16 2353  INR 1.59   Cardiac Enzymes: No results for input(s): CKTOTAL, CKMB, CKMBINDEX, TROPONINI in the last 168 hours. BNP (last 3 results) No results for input(s): PROBNP in the last 8760 hours.  CBG:  Recent Labs Lab 05/07/16 0756 05/08/16 0729 05/09/16 0749  GLUCAP 89 78 87    Studies: No results found.   Scheduled Meds: . B-complex with vitamin C  1 tablet Oral Daily  . beta carotene w/minerals  1 tablet Oral Daily  . darifenacin  7.5 mg Oral Daily  . dexamethasone  0.5 mg Oral Daily  . dextromethorphan-guaiFENesin  1 tablet Oral BID  . lamoTRIgine  100 mg Oral BID  . levETIRAcetam  750 mg Oral BID  . pantoprazole  40 mg Oral BID AC  . rivaroxaban  15 mg Oral BID AC   Followed by  . [START ON 05/19/2016] rivaroxaban  20 mg Oral Q supper  . rOPINIRole  1.5 mg Oral BID  . sodium chloride flush  3 mL Intravenous Q12H  . vitamin B-12  100 mcg Oral Daily   Continuous Infusions:   PRN Meds: albuterol, menthol-cetylpyridinium, ondansetron **OR** ondansetron (ZOFRAN) IV  Time spent: 30 minutes  Author: Berle Mull, MD [facility] Pager: [phone] 05/09/2016 6:15 PM  If 7PM-7AM, please contact night-coverage  at [email or web address], password [facility]

## 2016-05-10 ENCOUNTER — Inpatient Hospital Stay (HOSPITAL_COMMUNITY): Payer: Medicare Other

## 2016-05-10 LAB — CBC
HCT: 38.3 % — ABNORMAL LOW (ref 39.0–52.0)
HEMOGLOBIN: 12 g/dL — AB (ref 13.0–17.0)
MCH: 28.8 pg (ref 26.0–34.0)
MCHC: 31.3 g/dL (ref 30.0–36.0)
MCV: 92.1 fL (ref 78.0–100.0)
PLATELETS: 303 10*3/uL (ref 150–400)
RBC: 4.16 MIL/uL — AB (ref 4.22–5.81)
RDW: 18.2 % — ABNORMAL HIGH (ref 11.5–15.5)
WBC: 7.7 10*3/uL (ref 4.0–10.5)

## 2016-05-10 LAB — GLUCOSE, CAPILLARY: Glucose-Capillary: 96 mg/dL (ref 65–99)

## 2016-05-10 MED ORDER — PANTOPRAZOLE SODIUM 40 MG PO TBEC: 40.0000 mg | DELAYED_RELEASE_TABLET | Freq: Two times a day (BID) | ORAL | 0 refills | Status: AC

## 2016-05-10 MED ORDER — MENTHOL 3 MG MT LOZG: 1.0000 | LOZENGE | OROMUCOSAL | 12 refills | Status: AC | PRN

## 2016-05-10 NOTE — Procedures (Signed)
Objective Swallowing Evaluation: Type of Study: MBS-Modified Barium Swallow Study  Patient Details  Name: [name] MRN: [id number] Date of Birth: 09-Dec-1930  Today's Date: 05/10/2016 Time: SLP Start Time (ACUTE ONLY): 0806-SLP Stop Time (ACUTE ONLY): 0830 SLP Time Calculation (min) (ACUTE ONLY): 24 min  Past Medical History:  Past Medical History:  Diagnosis Date  . Anemia   . Aortic insufficiency 08/11/2015  . Basal cell cancer   . Brain tumor ([place])   . GERD (gastroesophageal reflux disease)   . Herniated disc    lumbar spine  . Hiatal hernia   . History of radiation therapy    back approximately 2010 prostate radiation under care of Dr. Valere Dross   . Hyperlipidemia   . Hypertension   . Meningioma ([place])    brain  . Nocturia   . Occult blood in stools   . OSA (obstructive sleep apnea) 10/28/2014   Moderate with AHI 21/hr  . Prostate cancer ([place])   . Restless leg   . Seizures ([place])    Past Surgical History:  Past Surgical History:  Procedure Laterality Date  . APPLICATION OF CRANIAL NAVIGATION N/A 02/17/2015   Procedure: APPLICATION OF CRANIAL NAVIGATION;  Surgeon: Consuella Lose, MD;  Location: [facility];  Service: Neurosurgery;  Laterality: N/A;  APPLICATION OF CRANIAL NAVIGATION  . Bilateral cateract surgery    . COLONOSCOPY     colon polyp removed  . CRANIOTOMY Right 09/27/2013   Procedure: RIGHT FRONTAL CRANIOTOMY FOR TUMOR RESECTION ;  Surgeon: Consuella Lose, MD;  Location: [facility];  Service: Neurosurgery;  Laterality: Right;  . CRANIOTOMY Right 02/17/2015   Procedure: Right frontal parietal craniotomy for resection of meningioma with brainlab;  Surgeon: Consuella Lose, MD;  Location: [facility];  Service: Neurosurgery;  Laterality: Right;  Right frontal parietal craniotomy for resection of meningioma with brainlab  . ESOPHAGOGASTRODUODENOSCOPY N/A 05/07/2016   Procedure: ESOPHAGOGASTRODUODENOSCOPY (EGD);  Surgeon: Clarene Essex, MD;  Location: [facility];  Service: Endoscopy;  Laterality: N/A;  . JOINT REPLACEMENT     right knee replacement  . LAPAROTOMY N/A 04/04/2016   Procedure: EXPLORATORY LAPAROTOMY, REPAIR OF GASTRIC PERFORATION WITH OMENTAL PATCH, PLACEMENT OF GASTROSTOMY TUBE;  Surgeon: Autumn Messing III, MD;  Location: [facility];  Service: General;  Laterality: N/A;  . PROSTATE BIOPSY    . TONSILLECTOMY     Agae 6-7  . VASECTOMY     HPI: 82 yo male adm [facility] with coffee ground emesis and abdomen pain.  Pt with PMH + for abdominal pain s/p GI surgery with PEG placed 7/17 (since removed).  Pt was noted to cough with liquids after surgery in July 2017 and was placed on nectar thick liquids- which he has been consuming since that time.  PMH + for meningioma s/p XRT and surgery, anemia, seizures.  [name] discharged to SNF before this readmit.  Swallow evaluation ordered.    Subjective: pt awake in chair  Assessment / Plan / Recommendation  CH[facility] IP CLINICAL IMPRESSIONS 05/10/2016  Therapy Diagnosis Mild pharyngeal phase dysphagia  Clinical Impression Minimal oral and mild pharyngeal dysphagia without aspiration of any consistency tested.  Pt with marked improved swallow compared to prior MBS last month.  Trace penetration of thin x1 due to decreased laryngeal closure cleared with cued throat clearing.  Overall pharyngeal swallow strong with only minimal vallecular residuals that are cleared with liquid and dry swallows.  Of note, pt did cough during MBS without barium visualized in larynx.  Using live video, educated pt to findings and reinforced effective compensation strategies.  Pt did appear with minimal delay in clearance of proximal esophagus of pudding - without sensation - liquids aided clearance.  Voice noted to be wet x1 after MBS.  Pt prefers pills with puree - therefore recommend to continue.  Recommend brief follow up for dysphagia management.    Impact on safety and function Mild aspiration risk      CH[facility] IP TREATMENT  RECOMMENDATION 05/10/2016  Treatment Recommendations Therapy as outlined in treatment plan below     Prognosis 05/09/2016  Prognosis for Safe Diet Advancement Good  Barriers to Reach Goals --  Barriers/Prognosis Comment --    [facility] IP DIET RECOMMENDATION 05/10/2016  SLP Diet Recommendations Regular solids;Thin liquid  Liquid Administration via Cup;Straw  Medication Administration Whole meds with puree  Compensations Slow rate;Small sips/bites;Clear throat intermittently  Postural Changes Remain semi-upright after after feeds/meals (Comment);Seated upright at 90 degrees      CH[facility] IP OTHER RECOMMENDATIONS 05/10/2016  Recommended Consults --  Oral Care Recommendations Oral care BID  Other Recommendations --      [facility] IP FOLLOW UP RECOMMENDATIONS 05/10/2016  Follow up Recommendations Skilled Nursing facility      [facility] IP FREQUENCY AND DURATION 05/10/2016  Speech Therapy Frequency (ACUTE ONLY) min 1 x/week  Treatment Duration 1 week           CH[facility] IP ORAL PHASE 05/10/2016  Oral Phase WFL  Oral - Pudding Teaspoon --  Oral - Pudding Cup --  Oral - Honey Teaspoon --  Oral - Honey Cup --  Oral - Nectar Teaspoon --  Oral - Nectar Cup WFL  Oral - Nectar Straw --  Oral - Thin Teaspoon WFL  Oral - Thin Cup WFL  Oral - Thin Straw WFL  Oral - Puree WFL  Oral - Mech Soft --  Oral - Regular WFL  Oral - Multi-Consistency WFL  Oral - Pill WFL  Oral Phase - Comment --    CH[facility] IP PHARYNGEAL PHASE 05/10/2016  Pharyngeal Phase Impaired  Pharyngeal- Pudding Teaspoon --  Pharyngeal --  Pharyngeal- Pudding Cup --  Pharyngeal --  Pharyngeal- Honey Teaspoon --  Pharyngeal --  Pharyngeal- Honey Cup --  Pharyngeal --  Pharyngeal- Nectar Teaspoon --  Pharyngeal --  Pharyngeal- Nectar Cup WFL  Pharyngeal --  Pharyngeal- Nectar Straw --  Pharyngeal --  Pharyngeal- Thin Teaspoon WFL  Pharyngeal --  Pharyngeal- Thin Cup Reduced tongue base retraction;Pharyngeal residue -  valleculae;Penetration/Aspiration during swallow  Pharyngeal Material enters airway, remains ABOVE vocal cords then ejected out  Pharyngeal- Thin Straw WFL  Pharyngeal --  Pharyngeal- Puree Delayed swallow initiation-vallecula;Pharyngeal residue - valleculae  Pharyngeal --  Pharyngeal- Mechanical Soft --  Pharyngeal --  Pharyngeal- Regular Delayed swallow initiation-vallecula  Pharyngeal --  Pharyngeal- Multi-consistency --  Pharyngeal --  Pharyngeal- Pill WFL  Pharyngeal --  Pharyngeal Comment --     CHL IP CERVICAL ESOPHAGEAL PHASE 05/10/2016  Cervical Esophageal Phase WFL  Pudding Teaspoon --  Pudding Cup --  Honey Teaspoon --  Honey Cup --  Nectar Teaspoon --  Nectar Cup --  Nectar Straw --  Thin Teaspoon --  Thin Cup --  Thin Straw --  Puree --  Mechanical Soft --  Regular --  Multi-consistency --  Pill --  Cervical Esophageal Comment --    Luanna Salk, [place] SLP [phone]

## 2016-05-10 NOTE — Discharge Summary (Signed)
[facility] Discharge Summary   Patient: [name] [id number]   PCP:  Melinda Crutch, MD DOB: Sep 19, 1931   Date of admission: 05/06/2016   Date of discharge:  05/10/2016    Discharge Diagnoses:  Principal Problem:   Hematemesis Active Problems:   GERD (gastroesophageal reflux disease)   Brain mass   OSA (obstructive sleep apnea)   Aortic insufficiency   Leukocytosis   Pulmonary embolus (HCC)   Hypotension   PUD (peptic ulcer disease)   Anticoagulated by anticoagulation treatment   History of pulmonary embolism   Admitted From: SNF Disposition:  SNF  Recommendations for Outpatient Follow-up:  1. Please follow up with PCP in 1week, 2. pleae follow up with Dr Watt Climes as needed  Follow-up Information     Melinda Crutch, MD. Schedule an appointment as soon as possible for a visit in 1 week(s).   Specialty:  Family Medicine Why:  with CBC Contact information: [place] [address] [phone]        [facility] E, MD. Call today.   Specialty:  Gastroenterology Why:  as needed. Contact information: [address] [place] [address] [phone]          Diet recommendation: regular diet, cardiac SLP Diet Recommendations Regular solids;Thin liquid  Liquid Administration via Cup;Straw  Medication Administration Whole meds with puree  Compensations Slow rate;Small sips/bites;Clear throat intermittently  Postural Changes Remain semi-upright after after feeds/meals (Comment);Seated upright at 90 degrees   Activity: The patient is advised to gradually reintroduce usual activities.  Discharge Condition: good  Code Status: full code  History of present illness: As per the H and P dictated on admission, "[name] is a 82 y.o. male with medical history significant for meningioma status post resection and radiation now with recurrence on radiation, peptic ulcer disease with perforation in July requiring exploratory laparotomy and  open repair, OSA, hypertension, iron deficiency anemia, and diagnosis of bilateral PEs with right heart strain 2 weeks ago now on Xarelto who presents with hematemesis. Patient was admitted with respiratory complaints 2 weeks ago, diagnosed with bilateral PEs with right heart strain, noted to have a left lower extremity DVT, and was started on Xarelto. He was discharged to a nursing home and had been doing well, apparently tolerating this Xarelto without incident into the development of hematemesis on 05/06/2016. Patient denies any associated abdominal pain or chest pain. He reports nausea followed by coffee-ground emesis, and then relief for 20-30 minutes before another episode. She has been taking twice a day Pepcid. He denies any fevers, chills, chest pain, palpitations, dyspnea, or cough. He denies any headaches, change in vision or hearing, loss of coordination, or focal numbness or weakness."  Hospital Course:  Summary of his active problems in the hospital is as following. 1. Hematemesis Status post EGD which found erosive esophagitis. Appreciate input from gastroenterology. Patient was started on anticoagulation with heparin. The patient remained stable on heparin so Xarelto was resumed on discharge. Tolerating soft diet.  2.Bilateral PE's, LLE DVT  - Diagnosed earlier this month with rt-heart strain on CT  - LLE DVT was identified by [place]  - Pt has been taking Xarelto as prescribed  3. Meningioma  - Had resection, followed by radiation, but unfortunately has a recurrence  - Currently under the care of oncology and rad onc and receiving radiation again  - Continue with Decadron per rad onc   4. OSA  - Continue CPAP qHS   5.  Leukocytosis  - WBC 10,700 on admission with no fever or conspicuous source of infection - Likely secondary to Decadron use  - Culture if febrile   6. Dysphagia: Speech therapy was consulted and they Recommended to advance pt diet to regular solids and  thin liquids.  All other chronic medical condition were stable during the hospitalization.  Patient was seen by physical therapy, who recommended SNF, which was arranged by Education officer, museum and case Freight forwarder. On the day of the discharge the patient's vitals were stable and Hb was stable, and no other acute medical condition were reported by patient. the patient was felt safe to be discharge at SNF with therapy.  Procedures and Results:  MBS SLP Diet Recommendations Regular solids;Thin liquid  Liquid Administration via Cup;Straw  Medication Administration Whole meds with puree  Compensations Slow rate;Small sips/bites;Clear throat intermittently  Postural Changes Remain semi-upright after after feeds/meals (Comment);Seated upright at 90 degrees     EGD - Use antireflux measures with head of bed elevated to 30? at all times and have him sitting up at 90? for 2 hours after eating - Continue present medications. - Use Protonix (pantoprazole) 40 mg PO BID today. - Return to GI clinic PRN. - Telephone GI clinic if symptomatic PRN.   PRBC transfusion    Consultations:  Gastroenterology   Speech therapy  DISCHARGE MEDICATION: Current Discharge Medication List    START taking these medications   Details  menthol-cetylpyridinium (CEPACOL) 3 MG lozenge Take 1 lozenge (3 mg total) by mouth as needed for sore throat. Qty: 100 tablet, Refills: 12    pantoprazole (PROTONIX) 40 MG tablet Take 1 tablet (40 mg total) by mouth 2 (two) times daily before a meal. Qty: 60 tablet, Refills: 0      CONTINUE these medications which have NOT CHANGED   Details  albuterol (PROVENTIL) (2.5 MG/3ML) 0.083% nebulizer solution Take 3 mLs (2.5 mg total) by nebulization every 4 (four) hours as needed for wheezing or shortness of breath. Qty: 75 mL, Refills: 12    B Complex-C (B-COMPLEX WITH VITAMIN C) tablet Take 1 tablet by mouth daily.    dexamethasone (DECADRON) 0.5 MG tablet Take 0.5 mg by mouth  daily. Stop date 05/13/16    famotidine (PEPCID) 20 MG tablet Take 1 tablet (20 mg total) by mouth 2 (two) times daily.    guaiFENesin (ROBITUSSIN) 100 MG/5ML SOLN Take 5 mLs (100 mg total) by mouth every 4 (four) hours as needed for cough or to loosen phlegm. Refills: 0    lamoTRIgine (LAMICTAL) 100 MG tablet Take 1 tablet (100 mg total) by mouth 2 (two) times daily. 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   Associated Diagnoses: Iron deficiency anemia due to chronic blood loss    ondansetron (ZOFRAN-ODT) 4 MG disintegrating tablet Take 1 tablet (4 mg total) by mouth every 6 (six) hours as needed for nausea. Qty: 20 tablet, Refills: 0    polyethylene glycol (MIRALAX / GLYCOLAX) packet Take 17 g by mouth daily. Qty: 14 each, Refills: 0    !! Rivaroxaban (XARELTO) 15 MG TABS tablet Take 1 tablet (15 mg total) by mouth 2 (two) times daily with a meal.    rOPINIRole (REQUIP) 3 MG tablet Take 1.5 mg by  mouth 2 (two) times daily.    senna-docusate (SENOKOT-S) 8.6-50 MG tablet Take 1 tablet by mouth 2 (two) times daily.    solifenacin (VESICARE) 5 MG tablet Take 5 mg by mouth every morning.    vitamin B-12 (CYANOCOBALAMIN) 100 MCG tablet Take 100 mcg by mouth daily.    acetaminophen (TYLENOL) 325 MG tablet Take 2 tablets (650 mg total) by mouth every 4 (four) hours as needed. Qty: 30 tablet, Refills: 0    !! rivaroxaban (XARELTO) 20 MG TABS tablet Take 1 tablet (20 mg total) by mouth daily with supper.     !! - Potential duplicate medications found. Please discuss with provider.    STOP taking these medications     amoxicillin-clavulanate (AUGMENTIN) 875-125 MG tablet       levofloxacin (LEVAQUIN) 500 MG tablet        Allergies  Allergen Reactions  . Asa [Aspirin] Nausea Only  . Other Itching and Rash    Nuts  . Vicodin [Hydrocodone-Acetaminophen] Nausea Only   Discharge Instructions    Diet - low sodium heart healthy    Complete by:  As directed   Increase activity slowly    Complete by:  As directed     Discharge Exam: Filed Weights   05/08/16 0500 05/09/16 0546 05/10/16 0523  Weight: 79.7 kg (175 lb 11.3 oz) 82.5 kg (181 lb 14.1 oz) 81.6 kg (179 lb 14.3 oz)   Vitals:   05/09/16 2115 05/10/16 0523  BP: (!) 156/84 138/71  Pulse: 80 79  Resp: 18 18  Temp: 98.9 F (37.2 C) 99.3 F (37.4 C)   General: Appear in no distress, no Rash; Oral Mucosa moist. Cardiovascular: S1 and S2 Present, no Murmur, on JVD Respiratory: Bilateral Air entry present and Clear to Auscultation, no Crackles, no wheezes Abdomen: Bowel Sound present, Soft and no tenderness Extremities: right Pedal edema, no calf tenderness Neurology: Grossly no focal neuro deficit.  The results of significant diagnostics from this hospitalization (including imaging, microbiology, ancillary and laboratory) are listed below for reference.    Significant Diagnostic Studies: Ct Head Wo Contrast  Result Date: 04/26/2016 CLINICAL DATA:  Left arm weakness noticed this morning while eating breakfast. EXAM: CT HEAD WITHOUT CONTRAST TECHNIQUE: Contiguous axial images were obtained from the base of the skull through the vertex without intravenous contrast. COMPARISON:  03/21/2016 brain MRI FINDINGS: Brain: There is known extra axial mass deep to the high right bone flap consistent with meningioma. Multiple areas of marginal nodular disease are poorly seen compared to previous MRI. There is known invasion of the superior sagittal sinus. White matter low-density without notable mass effect in the posterior right frontal, right parietal, and right occipital regions is stable from previous. No superimposed  infarct, hemorrhage, or hydrocephalus. Left posterior subdural collection on the previous study is not visualized today. Vascular: Atherosclerotic calcification. Meningioma is known to invade and/or occlude the superior sagittal sinus. Skull: Stable appearance of 2 high right craniotomy sites Sinuses/Orbits: Bilateral cataract resection.  No acute finding. Other: None. IMPRESSION: 1. No acute finding when compared to 03/21/2016 MRI. 2. Multi focal meningioma along the posterior right cerebral convexity with stable vasogenic edema and/or white matter gliosis. Electronically Signed   By: Monte Fantasia M.D.   On: 04/26/2016 10:44   Ct Angio Chest Pe W Or Wo Contrast  Result Date: 04/23/2016 CLINICAL DATA:  Pulmonary emboli identified in the right lower lobe on CT abdomen performed earlier today. Patient is status post recent laparotomy with gastric  perforation repair and placement of gastrostomy tube. EXAM: CT ANGIOGRAPHY CHEST WITH CONTRAST TECHNIQUE: Multidetector CT imaging of the chest was performed using the standard protocol during bolus administration of intravenous contrast. Multiplanar CT image reconstructions and MIPs were obtained to evaluate the vascular anatomy. CONTRAST:  80 cc Isovue 370 COMPARISON:  CT abdomen from earlier today. FINDINGS: Mediastinum/Lymph Nodes: Moderate to large amount of pulmonary embolus is seen at the distal aspects of each main pulmonary artery extending into the central segmental pulmonary artery branches to each lobe bilaterally. Nearly occlusive thrombus is seen within the bilateral intralobar pulmonary artery branches extending into multiple segmental pulmonary artery branches the lower lobes bilaterally. There is occlusive pulmonary embolus within the proximal aspects of the lingular pulmonary artery branch. Thoracic aorta is normal in caliber. Scattered atherosclerotic changes noted along the walls of the thoracic aorta. Heart size is normal. No pericardial effusion.  Coronary artery calcifications noted. No masses or enlarged lymph nodes seen within the mediastinum or perihilar regions. As noted on the earlier CT abdomen, there is a circumscribed-appearing collection within the lower mediastinum, adjacent to the anterior margin of the lower esophagus, measuring 2.8 x 1.7 cm, suspicious for early developing abscess. Lungs/Pleura: Small left pleural effusion with adjacent atelectasis. Trace right pleural effusion with adjacent atelectasis. Small ill-defined ground-glass opacities within the upper lobes bilaterally, likely mild edema. Pulmonary nodule within the medial aspects of the right upper lobe measures 1.3 x 1.1 cm (series 6, image 36). Additional nodule within the lingula measures 6 mm (series 6, image 41). Upper abdomen: Small layering stones within the otherwise normal-appearing gallbladder. Small low-density collection adjacent to the lateral margin of the stomach cardia, as described on the earlier abdomen CT. Musculoskeletal: No acute or suspicious osseous finding. Mild degenerative spurring within the thoracic spine. Mild compression deformity of an upper thoracic vertebral body appears stable. Superficial soft tissues are unremarkable. Review of the MIP images confirms the above findings. IMPRESSION: 1. Moderate to large amount of pulmonary emboli bilaterally. This includes occlusive pulmonary embolus within the lingular pulmonary artery branch and nearly occlusive pulmonary emboli within the distal aspects of each main pulmonary artery extending into multiple segmental lower lobe pulmonary artery branches bilaterally. There is CT evidence of right heart strain (with RV/LV Ratio greater than 1) consistent with at least submassive (intermediate risk) PE. The presence of right heart strain has been associated with an increased risk of morbidity and mortality. Please activate Code PE by paging [phone]. 2. Small left pleural effusion and trace right pleural  effusion. 3. Pulmonary nodule within the right upper lobe measuring 1.3 x 1.1 cm. This is new compared to an older chest CT of 04/06/2009. Neoplastic pulmonary nodule is not excluded. Consider further characterization with PET-CT after current issues are resolved. 4. Circumscribed fluid density collection within the lower mediastinum, adjacent to the anterior margin of the distal esophagus, measuring 2.8 x 1.7 cm, better demonstrated on the CT abdomen performed earlier today. On the earlier CT abdomen, this had an appearance suspicious for early developing abscess. Additional possible abscess versus expected postoperative fluid collection was also appreciated adjacent to the stomach cardia. 5. Aortic atherosclerosis. These results were called by telephone at the time of interpretation on 04/23/2016 at 3:24 p.m. to Dr. Denton Brick, who verbally acknowledged these results. Electronically Signed   By: Franki Cabot M.D.   On: 04/23/2016 15:31   Ct Abdomen Pelvis W Contrast  Result Date: 04/23/2016 CLINICAL DATA:  Discharge from hospital on July 28th after abdominal  surgery. Now with sharp pains at surgical site. Hematemesis last night. Status post recent laparotomy with repair of gastric perforation and placement of gastrostomy tube. EXAM: CT ABDOMEN AND PELVIS WITH CONTRAST TECHNIQUE: Multidetector CT imaging of the abdomen and pelvis was performed using the standard protocol following bolus administration of intravenous contrast. CONTRAST:  148mL ISOVUE-300 IOPAMIDOL (ISOVUE-300) INJECTION 61% COMPARISON:  CT abdomen status 04/04/2016 09/11/2015. FINDINGS: Lower chest: Small left pleural effusion and mild bibasilar atelectasis. Multiple acute appearing pulmonary emboli are seen within the segmental pulmonary artery branches to the right lower lobe. Hepatobiliary: Small layering stones within the otherwise normal-appearing gallbladder. Liver appears normal. Pancreas: Partially infiltrated with fat but otherwise  unremarkable. Spleen: Within normal limits in size and appearance. Adrenals/Urinary Tract: Adrenal glands appear normal. Kidneys appear normal without mass, stone or hydronephrosis. No ureteral or bladder calculi identified. Bladder appears normal. Stomach/Bowel: Small hiatal hernia, significantly improved in appearance compared to the recent CT of 04/04/2016. There is a circumscribed fluid collection within the lower mediastinum, just to the left of the anterior margin of the lower esophagus, measuring 2.7 x 1.6 cm, suspicious for early developing abscess. Additional smaller less well circumscribed low-density collection is seen adjacent to the stomach cardia, measuring approximately 2.6 x 1.5 cm, possible additional early abscess formation. No dilated large or small bowel loops. Gastrostomy tube appears well positioned. Scattered diverticulosis within the descending and sigmoid colon without evidence of acute diverticulitis. Vascular/Lymphatic: Scattered atherosclerotic changes of the normal caliber abdominal aorta. No enlarged lymph nodes seen. Reproductive: No mass or other significant abnormality. Other: No free intraperitoneal air. Musculoskeletal: Degenerative changes throughout the slightly scoliotic thoracolumbar spine. No acute or suspicious osseous finding. IMPRESSION: 1. Interval surgical changes of the stomach, compatible with the given history of recent repair of gastric perforation and placement of gastrostomy tube. Circumscribed fluid density collection within the lower mediastinum, chest to the left of the anterior margin of the lower esophagus, measuring 2.7 x 1.6 cm, possibly expected postoperative fluid collection but suspicious for early developing abscess collection. Associated perforation cannot be excluded, but no extraluminal air to confirm. 2. Poorly defined low-density collection adjacent to the stomach cardia, measuring approximately 2.6 x 1.5 cm, expected postsurgical fluid versus  additional early developing abscess. 3. Acute appearing pulmonary emboli within segmental pulmonary artery branches to the right lower lobe. Would consider CT pulmonary angiogram for more complete characterization of the pulmonary arteries. 4. Gastrostomy tube appears well positioned. 5. No bowel obstruction.  No free intraperitoneal air. 6. Colonic diverticulosis without evidence of acute diverticulitis. 7. Cholelithiasis without evidence of acute cholecystitis. 8. Aortic atherosclerosis. 9. Small left pleural effusion. Critical Value/emergent results were called by telephone at the time of interpretation on 04/23/2016 at 12:22 pm to Dr. Johnney Killian, who verbally acknowledged these results. Electronically Signed   By: Franki Cabot M.D.   On: 04/23/2016 12:24   Dg Chest Port 1 View  Result Date: 05/08/2016 CLINICAL DATA:  Short of breath. Cough. Pulmonary emboli on prior CT. EXAM: PORTABLE CHEST 1 VIEW COMPARISON:  Plain film and CT of 04/23/2016. FINDINGS: Midline trachea. Patient rotated right. Mild cardiomegaly. No pleural effusion or pneumothorax. Clear lungs. No congestive failure. IMPRESSION: No acute cardiopulmonary disease. Cardiomegaly without congestive failure. Electronically Signed   By: Abigail Miyamoto M.D.   On: 05/08/2016 11:06   Dg Chest Port 1 View  Result Date: 04/23/2016 CLINICAL DATA:  82 year old male with cough and hypotension EXAM: PORTABLE CHEST 1 VIEW COMPARISON:  Chest radiograph dated 04/08/2016 FINDINGS: Single  portable view of chest demonstrates emphysematous changes of the lungs. There is mild eventration of the left hemidiaphragm. No focal consolidation, pleural effusion, or pneumothorax. Stable cardiac silhouette. Osteopenia with degenerative changes of the shoulders. No acute osseous pathology. IMPRESSION: No active disease. Electronically Signed   By: Anner Crete M.D.   On: 04/23/2016 04:56   Dg Swallowing Func-speech Pathology  Result Date: 05/10/2016 Objective Swallowing  Evaluation: Type of Study: MBS-Modified Barium Swallow Study Patient Details Name: DEZJUAN COPUS MRN: [id number] Date of Birth: Mar 14, 1931 Today's Date: 05/10/2016 Time: SLP Start Time (ACUTE ONLY): 0806-SLP Stop Time (ACUTE ONLY): 0830 SLP Time Calculation (min) (ACUTE ONLY): 24 min Past Medical History: Past Medical History: Diagnosis Date . Anemia  . Aortic insufficiency 08/11/2015 . Basal cell cancer  . Brain tumor ([place])  . GERD (gastroesophageal reflux disease)  . Herniated disc   lumbar spine . Hiatal hernia  . History of radiation therapy   back approximately 2010 prostate radiation under care of Dr. Valere Dross  . Hyperlipidemia  . Hypertension  . Meningioma ([place])   brain . Nocturia  . Occult blood in stools  . OSA (obstructive sleep apnea) 10/28/2014  Moderate with AHI 21/hr . Prostate cancer ([place])  . Restless leg  . Seizures ([place])  Past Surgical History: Past Surgical History: Procedure Laterality Date . APPLICATION OF CRANIAL NAVIGATION N/A [id number]  Procedure: APPLICATION OF CRANIAL NAVIGATION;  Surgeon: Consuella Lose, MD;  Location: [facility];  Service: Neurosurgery;  Laterality: N/A;  APPLICATION OF CRANIAL NAVIGATION . Bilateral cateract surgery   . COLONOSCOPY    colon polyp removed . CRANIOTOMY Right 09/27/2013  Procedure: RIGHT FRONTAL CRANIOTOMY FOR TUMOR RESECTION ;  Surgeon: Consuella Lose, MD;  Location: [facility];  Service: Neurosurgery;  Laterality: Right; . CRANIOTOMY Right 02/17/2015  Procedure: Right frontal parietal craniotomy for resection of meningioma with brainlab;  Surgeon: Consuella Lose, MD;  Location: [facility];  Service: Neurosurgery;  Laterality: Right;  Right frontal parietal craniotomy for resection of meningioma with brainlab . ESOPHAGOGASTRODUODENOSCOPY N/A 05/07/2016  Procedure: ESOPHAGOGASTRODUODENOSCOPY (EGD);  Surgeon: Clarene Essex, MD;  Location: [facility];  Service: Endoscopy;  Laterality: N/A; . JOINT REPLACEMENT    right knee replacement . LAPAROTOMY N/A  04/04/2016  Procedure: EXPLORATORY LAPAROTOMY, REPAIR OF GASTRIC PERFORATION WITH OMENTAL PATCH, PLACEMENT OF GASTROSTOMY TUBE;  Surgeon: Autumn Messing III, MD;  Location: [facility];  Service: General;  Laterality: N/A; . PROSTATE BIOPSY   . TONSILLECTOMY    Agae 6-7 . VASECTOMY   HPI: 82 yo male adm to [facility] with coffee ground emesis and abdomen pain.  Pt with PMH + for abdominal pain s/p GI surgery with PEG placed 7/17 (since removed).  Pt was noted to cough with liquids after surgery in July 2017 and was placed on nectar thick liquids- which he has been consuming since that time.  PMH + for meningioma s/p XRT and surgery, anemia, seizures.  Mr Gentilcore discharged to SNF before this readmit.  Swallow evaluation ordered.   Subjective: pt awake in chair Assessment / Plan / Recommendation CHL IP CLINICAL IMPRESSIONS 05/10/2016 Therapy Diagnosis Mild pharyngeal phase dysphagia Clinical Impression Minimal oral and mild pharyngeal dysphagia without aspiration of any consistency tested.  Pt with marked improved swallow compared to prior MBS last month.  Trace penetration of thin x1 due to decreased laryngeal closure cleared with cued throat clearing.  Overall pharyngeal swallow strong with only minimal vallecular residuals that are cleared with liquid and dry swallows.  Of note,  pt did cough during MBS without barium visualized in larynx.  Using live video, educated pt to findings and reinforced effective compensation strategies.  Pt did appear with minimal delay in clearance of proximal esophagus of pudding - without sensation - liquids aided clearance.  Voice noted to be wet x1 after MBS.  Pt prefers pills with puree - therefore recommend to continue.  Recommend brief follow up for dysphagia management.   Impact on safety and function Mild aspiration risk   CHL IP TREATMENT RECOMMENDATION 05/10/2016 Treatment Recommendations Therapy as outlined in treatment plan below   Prognosis 05/09/2016 Prognosis for Safe Diet Advancement Good  Barriers to Reach Goals -- Barriers/Prognosis Comment -- CHL IP DIET RECOMMENDATION 05/10/2016 SLP Diet Recommendations Regular solids;Thin liquid Liquid Administration via Cup;Straw Medication Administration Whole meds with puree Compensations Slow rate;Small sips/bites;Clear throat intermittently Postural Changes Remain semi-upright after after feeds/meals (Comment);Seated upright at 90 degrees   CHL IP OTHER RECOMMENDATIONS 05/10/2016 Recommended Consults -- Oral Care Recommendations Oral care BID Other Recommendations --   CHL IP FOLLOW UP RECOMMENDATIONS 05/10/2016 Follow up Recommendations Skilled Nursing facility   [facility] IP FREQUENCY AND DURATION 05/10/2016 Speech Therapy Frequency (ACUTE ONLY) min 1 x/week Treatment Duration 1 week      CHL IP ORAL PHASE 05/10/2016 Oral Phase WFL Oral - Pudding Teaspoon -- Oral - Pudding Cup -- Oral - Honey Teaspoon -- Oral - Honey Cup -- Oral - Nectar Teaspoon -- Oral - Nectar Cup WFL Oral - Nectar Straw -- Oral - Thin Teaspoon WFL Oral - Thin Cup WFL Oral - Thin Straw WFL Oral - Puree WFL Oral - Mech Soft -- Oral - Regular WFL Oral - Multi-Consistency WFL Oral - Pill WFL Oral Phase - Comment --  CHL IP PHARYNGEAL PHASE 05/10/2016 Pharyngeal Phase Impaired Pharyngeal- Pudding Teaspoon -- Pharyngeal -- Pharyngeal- Pudding Cup -- Pharyngeal -- Pharyngeal- Honey Teaspoon -- Pharyngeal -- Pharyngeal- Honey Cup -- Pharyngeal -- Pharyngeal- Nectar Teaspoon -- Pharyngeal -- Pharyngeal- Nectar Cup WFL Pharyngeal -- Pharyngeal- Nectar Straw -- Pharyngeal -- Pharyngeal- Thin Teaspoon WFL Pharyngeal -- Pharyngeal- Thin Cup Reduced tongue base retraction;Pharyngeal residue - valleculae;Penetration/Aspiration during swallow Pharyngeal Material enters airway, remains ABOVE vocal cords then ejected out Pharyngeal- Thin Straw WFL Pharyngeal -- Pharyngeal- Puree Delayed swallow initiation-vallecula;Pharyngeal residue - valleculae Pharyngeal -- Pharyngeal- Mechanical Soft -- Pharyngeal --  Pharyngeal- Regular Delayed swallow initiation-vallecula Pharyngeal -- Pharyngeal- Multi-consistency -- Pharyngeal -- Pharyngeal- Pill WFL Pharyngeal -- Pharyngeal Comment --  CHL IP CERVICAL ESOPHAGEAL PHASE 05/10/2016 Cervical Esophageal Phase WFL Pudding Teaspoon -- Pudding Cup -- Honey Teaspoon -- Honey Cup -- Nectar Teaspoon -- Nectar Cup -- Nectar Straw -- Thin Teaspoon -- Thin Cup -- Thin Straw -- Puree -- Mechanical Soft -- Regular -- Multi-consistency -- Pill -- Cervical Esophageal Comment -- No flowsheet data found. Janett Labella [place], [place] [facility] SLP [phone]              Dg Swallowing Func-speech Pathology  Result Date: 04/13/2016 Objective Swallowing Evaluation: Type of Study: MBS-Modified Barium Swallow Study Patient Details Name: MAJDI MOEDER MRN: [id number] Date of Birth: August 28, 1931 Today's Date: 04/13/2016 Time: SLP Start Time (ACUTE ONLY): 1430-SLP Stop Time (ACUTE ONLY): 1500 SLP Time Calculation (min) (ACUTE ONLY): 30 min Past Medical History: Past Medical History: Diagnosis Date . Anemia  . Aortic insufficiency 08/11/2015 . Basal cell cancer  . Brain tumor ([place])  . GERD (gastroesophageal reflux disease)  . Herniated disc   lumbar spine . Hiatal hernia  . History  of radiation therapy   back approximately 2010 prostate radiation under care of Dr. Valere Dross  . Hyperlipidemia  . Hypertension  . Meningioma ([place])   brain . Nocturia  . Occult blood in stools  . OSA (obstructive sleep apnea) 10/28/2014  Moderate with AHI 21/hr . Prostate cancer ([place])  . Restless leg  . Seizures ([place])  Past Surgical History: Past Surgical History: Procedure Laterality Date . APPLICATION OF CRANIAL NAVIGATION N/A [id number]  Procedure: APPLICATION OF CRANIAL NAVIGATION;  Surgeon: Consuella Lose, MD;  Location: [place] NEURO ORS;  Service: Neurosurgery;  Laterality: N/A;  APPLICATION OF CRANIAL NAVIGATION . Bilateral cateract surgery   . COLONOSCOPY    colon polyp removed . CRANIOTOMY Right 09/27/2013  Procedure: RIGHT  FRONTAL CRANIOTOMY FOR TUMOR RESECTION ;  Surgeon: Consuella Lose, MD;  Location: [place] NEURO ORS;  Service: Neurosurgery;  Laterality: Right; . CRANIOTOMY Right 02/17/2015  Procedure: Right frontal parietal craniotomy for resection of meningioma with brainlab;  Surgeon: Consuella Lose, MD;  Location: [place] NEURO ORS;  Service: Neurosurgery;  Laterality: Right;  Right frontal parietal craniotomy for resection of meningioma with brainlab . JOINT REPLACEMENT    right knee replacement . LAPAROTOMY N/A 04/04/2016  Procedure: EXPLORATORY LAPAROTOMY, REPAIR OF GASTRIC PERFORATION WITH OMENTAL PATCH, PLACEMENT OF GASTROSTOMY TUBE;  Surgeon: Autumn Messing III, MD;  Location: [facility] ORS;  Service: General;  Laterality: N/A; . PROSTATE BIOPSY   . TONSILLECTOMY    Agae 6-7 . VASECTOMY   HPI: 82 yo male adm to [facility] with abdominal pain - pt is s/p surgery with PEG placed 7/17 with intubation a few days.  Per wife, pt has been in ICU and has not had po until yesterday.   Pt started po intake yesteday and was coughing with liquids, therefore diet was changed to nectar liquids and SLP ordered.   Diet was advanced to regular/thin today.  Pt PMH + for meningioma s/p XRT and surgery, anemia, seizures.  Pt CXR shows atelectasis.   Subjective: pt seen in radiology for MBS Assessment / Plan / Recommendation CHL IP CLINICAL IMPRESSIONS 04/13/2016 Therapy Diagnosis Mild oral phase dysphagia;Mild pharyngeal phase dysphagia Clinical Impression Pt presents with mild oropharyngeal dysphagia, characterized by poor bolus formation of puree and solid with posterior spillage to the vallecula, and right anterior leakage of liquids. Swallow reflex was delayed, with trigger at the vallecula on the initial swallow. Poor airway closure resulted in flash penetration of thin liquids during the initial swallow. Nectar thick liquid prevented penetration of the initial swallow, however, Residue of thin and nectar thick liquid on tongue base and vallecula was noted to  spill to the pyriform prior to trigger of (cued) dry swallow.  Small boluses and immediate second swallow prevented penetration of nectar thick liquids. Recommend dys 3 solids with chopped meats and nectar thick liquids, SMALL bites and sips at a SLOW rate, with 2 swallows per bite/sip. Meds one at a time with puree. RN was notified of results and recommendations, and safe swallow precautions were posted at [facility]. ST will follow for education and assessment of diet tolerance. Recommend repeat MBS prior to advancing liquids, given lack of reflexive response to penetration on this study. Impact on safety and function Risk for inadequate nutrition/hydration;Moderate aspiration risk;Mild aspiration risk   CHL IP TREATMENT RECOMMENDATION 04/13/2016 Treatment Recommendations Therapy as outlined in treatment plan below   Prognosis 04/13/2016 Prognosis for Safe Diet Advancement Guarded Barriers to Reach Goals Motivation CHL IP DIET RECOMMENDATION 04/13/2016 SLP Diet Recommendations Dysphagia 3 (Mech soft)  solids;Nectar thick liquid Liquid Administration via No straw;Cup Medication Administration Whole meds with puree Compensations Slow rate;Small sips/bites;Clear throat intermittently;Other (Comment);Minimize environmental distractions;Multiple dry swallows after each bite/sip Postural Changes Seated upright at 90 degrees   CHL IP OTHER RECOMMENDATIONS 04/13/2016 Recommended Consults -- Oral Care Recommendations Oral care BID Other Recommendations Order thickener from pharmacy;Prohibited food (jello, ice cream, thin soups);Remove water pitcher   CHL IP FOLLOW UP RECOMMENDATIONS 04/12/2016 Follow up Recommendations To be determined   CHL IP FREQUENCY AND DURATION 04/13/2016 Speech Therapy Frequency (ACUTE ONLY) min 2x/week Treatment Duration 1 week    CHL IP ORAL PHASE 04/13/2016 Oral Phase Impaired Oral - Nectar Cup Right anterior bolus loss Oral - Thin Cup Right anterior bolus loss Oral - Puree Reduced posterior propulsion;Delayed  oral transit;Premature spillage Oral - Multi-Consistency Premature spillage;Delayed oral transit;Reduced posterior propulsion  CHL IP PHARYNGEAL PHASE 04/13/2016 Pharyngeal Phase Impaired Pharyngeal- Nectar Cup Delayed swallow initiation-vallecula;Reduced pharyngeal peristalsis;Reduced anterior laryngeal mobility;Reduced laryngeal elevation;Reduced airway/laryngeal closure;Reduced tongue base retraction;Penetration/Aspiration during swallow;Pharyngeal residue - valleculae;Delayed swallow initiation-pyriform sinuses Pharyngeal Material enters airway, remains ABOVE vocal cords then ejected out Pharyngeal- Thin Cup Delayed swallow initiation-vallecula;Delayed swallow initiation-pyriform sinuses;Reduced pharyngeal peristalsis;Reduced anterior laryngeal mobility;Reduced laryngeal elevation;Reduced airway/laryngeal closure;Pharyngeal residue - valleculae;Reduced tongue base retraction;Penetration/Aspiration during swallow Pharyngeal Material enters airway, remains ABOVE vocal cords then ejected out Pharyngeal- Puree Delayed swallow initiation-vallecula;Reduced tongue base retraction Pharyngeal- Multi-consistency Delayed swallow initiation-vallecula;Reduced tongue base retraction  CHL IP CERVICAL ESOPHAGEAL PHASE 04/13/2016 Cervical Esophageal Phase [facility] Celia B. [place], [facility], [place] Shonna Chock 04/13/2016, 3:19 PM               Microbiology: No results found for this or any previous visit (from the past 240 hour(s)).   Labs: CBC:  Recent Labs Lab 05/06/16 2353  05/07/16 1021 05/07/16 1528 05/08/16 0325 05/09/16 0540 05/10/16 0516  WBC 10.7*  --   --   --  8.3 7.2 7.7  NEUTROABS 8.3*  --   --   --   --   --   --   HGB 13.4  < > 11.7* 11.5* 11.5* 11.8* 12.0*  HCT 42.4  < > 37.6* 36.8* 36.6* 36.6* 38.3*  MCV 92.4  --   --   --  91.3 91.0 92.1  PLT 361  --   --   --  316 299 303  < > = values in this interval not displayed. Basic Metabolic Panel:  Recent Labs Lab 05/06/16 2353  05/07/16 0634 05/08/16 0325 05/09/16 0540  NA 134* 135 135 135  K 3.7 3.4* 3.8 3.8  CL 100* 103 106 104  CO2 27 26 25 25   GLUCOSE 101* 92 75 86  BUN 19 16 13 9   CREATININE 0.96 0.84 0.79 0.82  CALCIUM 8.8* 7.9* 8.3* 8.2*   Liver Function Tests:  Recent Labs Lab 05/06/16 2353 05/09/16 0540  AST 21 22  ALT 20 18  ALKPHOS 74 59  BILITOT 0.6 0.4  PROT 6.1* 4.8*  ALBUMIN 3.0* 2.6*   CBG:  Recent Labs Lab 05/07/16 0756 05/08/16 0729 05/09/16 0749 05/10/16 0741  GLUCAP 89 78 87 96   Time spent: 30 minutes  Signed:  Berle Mull  [facility]  05/10/2016  , 4:26 PM

## 2016-05-10 NOTE — Progress Notes (Signed)
Report called to nurse Paulina at [facility]. Patient A&O, VSS, awaiting [facility] transport. Wife aware of transport. Wound vac removed- wet to dry dressing place. Patient resting comfortably in bed.

## 2016-05-10 NOTE — Clinical Social Work Note (Signed)
Patient wishes to return to [facility]. Facility notified of concerns with care. Wife also agreeable with patient's decision to return to [facility].    [facility] Medicare authorization requested for SNF on morning of 8/22. MSW currently awaiting authorization.   Glendon Axe, MSW [phone] 05/10/2016 11:56 AM

## 2016-05-10 NOTE — Clinical Social Work Note (Signed)
Medical Social Worker facilitated patient discharge including contacting patient family and facility to confirm patient discharge plans.  Clinical information faxed to facility and family agreeable with plan.  MSW arranged ambulance transport via [place] to [facility] and [place].  RN to call report prior to discharge.  Medical Social Worker will sign off for now as social work intervention is no longer needed. Please consult [place] again if new need arises.  Glendon Axe, MSW [phone] 05/10/2016 4:28 PM

## 2016-05-11 ENCOUNTER — Encounter: Payer: Self-pay | Admitting: Internal Medicine

## 2016-05-11 ENCOUNTER — Non-Acute Institutional Stay (SKILLED_NURSING_FACILITY): Payer: Medicare Other | Admitting: Internal Medicine

## 2016-05-11 DIAGNOSIS — R1312 Dysphagia, oropharyngeal phase: Secondary | ICD-10-CM

## 2016-05-11 DIAGNOSIS — Z4889 Encounter for other specified surgical aftercare: Secondary | ICD-10-CM | POA: Diagnosis not present

## 2016-05-11 DIAGNOSIS — G2581 Restless legs syndrome: Secondary | ICD-10-CM | POA: Diagnosis not present

## 2016-05-11 DIAGNOSIS — D62 Acute posthemorrhagic anemia: Secondary | ICD-10-CM

## 2016-05-11 DIAGNOSIS — R5381 Other malaise: Secondary | ICD-10-CM

## 2016-05-11 DIAGNOSIS — I2699 Other pulmonary embolism without acute cor pulmonale: Secondary | ICD-10-CM

## 2016-05-11 DIAGNOSIS — D320 Benign neoplasm of cerebral meninges: Secondary | ICD-10-CM | POA: Diagnosis not present

## 2016-05-11 DIAGNOSIS — K208 Other esophagitis: Secondary | ICD-10-CM

## 2016-05-11 DIAGNOSIS — I10 Essential (primary) hypertension: Secondary | ICD-10-CM | POA: Diagnosis not present

## 2016-05-11 DIAGNOSIS — R05 Cough: Secondary | ICD-10-CM | POA: Diagnosis not present

## 2016-05-11 DIAGNOSIS — K2210 Ulcer of esophagus without bleeding: Secondary | ICD-10-CM

## 2016-05-11 DIAGNOSIS — K5909 Other constipation: Secondary | ICD-10-CM

## 2016-05-11 DIAGNOSIS — R059 Cough, unspecified: Secondary | ICD-10-CM

## 2016-05-11 NOTE — Progress Notes (Signed)
LOCATION: [name]  PCP:  Paul Crutch, MD   Code Status: Full Code  Goals of care: Advanced Directive information Advanced Directives 05/07/2016  Does patient have an advance directive? No  Would patient like information on creating an advanced directive? -  Pre-existing out of facility DNR order (yellow form or pink MOST form) -       Extended Emergency Contact Information Primary Emergency Contact: [name] Address: [address]          [place], [place] [address] [place] Phone: [phone] Mobile Phone: [phone] Relation: Spouse   Allergies  Allergen Reactions  . Asa [Aspirin] Nausea Only  . Other Itching and Rash    Nuts  . Vicodin [Hydrocodone-Acetaminophen] Nausea Only    Chief Complaint  Patient presents with  . Readmit To SNF    Readmission     HPI:  Patient is a 82 y.o. male seen today for short term rehabilitation post hospital re-admission from 05/06/16-05/10/16 with hematemesis. He was seen by GI and underwent EGD showing erosive esophagitis. He was started on iv PPI, PRBC transfusion and is now on protonix bid. He was in the facility undergoing rehabilitation before this admission. He has a newly diagnosed pulmonary embolism with right heart strain and LLE DVT and was on xarelto prior to this hematemesis. He also recently had surgical repair of his perforated gastric ulcer and umbilical hernia and was in the hospital for this. He is seen in his room today with his wife at bedside. He is to see his general surgeon today for follow up. He has PMH of meningioma, OSA, HTN, iron def anemia among others.    Review of Systems:  Constitutional: Negative for fever, chills, diaphoresis. positive for generalized weakness.  HENT: Negative for headache, congestion, nasal discharge. He has difficulty swallowing. Eyes: Negative for blurred vision, double vision and discharge. Wears glasses. Respiratory: Negative for cough, shortness of  breath and wheezing.   Cardiovascular: Negative for chest pain, palpitations, leg swelling.  Gastrointestinal: Negative for heartburn, nausea, vomiting, abdominal pain. Last bowel movement was the day before yesterday. Genitourinary: Negative for dysuria and flank pain.  Musculoskeletal: Negative for back pain, fall in the facility.  Skin: Negative for itching, rash.  Neurological: Negative for dizziness. Psychiatric/Behavioral: Negative for depression.    Past Medical History:  Diagnosis Date  . Anemia   . Aortic insufficiency 08/11/2015  . Basal cell cancer   . Brain tumor ([place])   . GERD (gastroesophageal reflux disease)   . Herniated disc    lumbar spine  . Hiatal hernia   . History of radiation therapy    back approximately 2010 prostate radiation under care of Dr. Valere [name]   . Hyperlipidemia   . Hypertension   . Meningioma ([place])    brain  . Nocturia   . Occult blood in stools   . OSA (obstructive sleep apnea) 10/28/2014   Moderate with AHI 21/hr  . Prostate cancer ([place])   . Restless leg   . Seizures ([place])    Past Surgical History:  Procedure Laterality Date  . APPLICATION OF CRANIAL NAVIGATION N/A 02/17/2015   Procedure: APPLICATION OF CRANIAL NAVIGATION;  Surgeon: Paul Lose, MD;  Location: [place] [facility];  Service: Neurosurgery;  Laterality: N/A;  APPLICATION OF CRANIAL NAVIGATION  . Bilateral cateract surgery    . COLONOSCOPY     colon polyp removed  . CRANIOTOMY Right 09/27/2013   Procedure: RIGHT FRONTAL CRANIOTOMY FOR TUMOR RESECTION ;  Surgeon: Paul Lose, MD;  Location: [place] [facility];  Service: Neurosurgery;  Laterality: Right;  . CRANIOTOMY Right 02/17/2015   Procedure: Right frontal parietal craniotomy for resection of meningioma with brainlab;  Surgeon: Paul Lose, MD;  Location: [place] [facility];  Service: Neurosurgery;  Laterality: Right;  Right frontal parietal craniotomy for resection of meningioma with brainlab  . ESOPHAGOGASTRODUODENOSCOPY N/A  05/07/2016   Procedure: ESOPHAGOGASTRODUODENOSCOPY (EGD);  Surgeon: Paul Essex, MD;  Location: [facility];  Service: Endoscopy;  Laterality: N/A;  . JOINT REPLACEMENT     right knee replacement  . LAPAROTOMY N/A 04/04/2016   Procedure: EXPLORATORY LAPAROTOMY, REPAIR OF GASTRIC PERFORATION WITH OMENTAL PATCH, PLACEMENT OF GASTROSTOMY TUBE;  Surgeon: Paul Messing III, MD;  Location: [facility];  Service: General;  Laterality: N/A;  . PROSTATE BIOPSY    . TONSILLECTOMY     Agae 6-7  . VASECTOMY     Social History:   reports that he has never smoked. He quit smokeless tobacco use about 42 years ago. His smokeless tobacco use included Chew. He reports that he does not drink alcohol or use drugs.  Family History  Problem Relation Age of Onset  . Parkinsonism Mother   . Dementia Father     Medications:   Medication List       Accurate as of 05/11/16  1:00 PM. Always use your most recent med list.          acetaminophen 325 MG tablet Commonly known as:  TYLENOL Take 2 tablets (650 mg total) by mouth every 4 (four) hours as needed.   albuterol (2.5 MG/3ML) 0.083% nebulizer solution Commonly known as:  PROVENTIL Take 3 mLs (2.5 mg total) by nebulization every 4 (four) hours as needed for wheezing or shortness of breath.   B-complex with vitamin C tablet Take 1 tablet by mouth daily.   dexamethasone 0.5 MG tablet Commonly known as:  DECADRON Take 0.5 mg by mouth daily. Stop date 05/13/16   famotidine 20 MG tablet Commonly known as:  PEPCID Take 1 tablet (20 mg total) by mouth 2 (two) times daily.   guaiFENesin 100 MG/5ML Soln Commonly known as:  ROBITUSSIN Take 5 mLs (100 mg total) by mouth every 4 (four) hours as needed for cough or to loosen phlegm.   lamoTRIgine 100 MG tablet Commonly known as:  LAMICTAL Take 1 tablet (100 mg total) by mouth 2 (two) times daily.   levETIRAcetam 500 MG tablet Commonly known as:  KEPPRA 750mg  BID   menthol-cetylpyridinium 3 MG  lozenge Commonly known as:  CEPACOL Take 1 lozenge (3 mg total) by mouth as needed for sore throat.   multivitamin-lutein Caps capsule Take 1 capsule by mouth daily.   ondansetron 4 MG disintegrating tablet Commonly known as:  ZOFRAN-ODT Take 1 tablet (4 mg total) by mouth every 6 (six) hours as needed for nausea.   pantoprazole 40 MG tablet Commonly known as:  PROTONIX Take 1 tablet (40 mg total) by mouth 2 (two) times daily before a meal.   polyethylene glycol packet Commonly known as:  MIRALAX / GLYCOLAX Take 17 g by mouth daily.   Rivaroxaban 15 MG Tabs tablet Commonly known as:  XARELTO Take 1 tablet (15 mg total) by mouth 2 (two) times daily with a meal.   rivaroxaban 20 MG Tabs tablet Commonly known as:  XARELTO Take 1 tablet (20 mg total) by mouth daily with supper. Start taking on:  05/19/2016   rOPINIRole 3 MG tablet Commonly known as:  REQUIP Take 1.5 mg by mouth 2 (two) times daily.   solifenacin 5 MG tablet Commonly known as:  VESICARE Take 5 mg by mouth every morning.   vitamin B-12 100 MCG tablet Commonly known as:  CYANOCOBALAMIN Take 100 mcg by mouth daily.       Immunizations: Immunization History  Administered Date(s) Administered  . Influenza Split 06/19/2014  . PPD Test 04/15/2016, 05/01/2016     Physical Exam: Vitals:   05/11/16 1249  BP: 130/74  Pulse: 81  Resp: 16  SpO2: 94%  Weight: 175 lb (79.4 kg)  Height: 5\' 6"  (1.676 m)   Body mass index is 28.25 kg/m.  General- elderly frail male, in no acute distress Head- normocephalic, atraumatic Nose- no nasal discharge Throat- moist mucus membrane Eyes- PERRLA, EOMI, no pallor, no icterus, no discharge Neck- no cervical lymphadenopathy Cardiovascular- normal s1,s2, no murmur Respiratory- bilateral clear to auscultation, no wheeze, no rhonchi, no crackles, no use of accessory muscles Abdomen- bowel sounds present, soft, non tender Musculoskeletal- able to move all 4 extremities,  generalized weakness with LLE weakness most prominent Neurological- alert and oriented to person, place and time Skin- warm and dry, abdominal wall incision with dressing in place, easy bruising Psychiatry- normal mood and affect   Labs reviewed: Basic Metabolic Panel:  Recent Labs  04/05/16 0226  04/09/16 0410 04/10/16 0402 04/11/16 0310  05/07/16 0634 05/08/16 0325 05/09/16 0540  NA 135  < > 141 141 138  < > 135 135 135  K 3.8  < > 2.8* 3.0* 3.6  < > 3.4* 3.8 3.8  CL 104  < > 108 108 106  < > 103 106 104  CO2 21*  < > 28 26 26   < > 26 25 25   GLUCOSE 163*  < > 82 94 88  < > 92 75 86  BUN 22*  < > 24* 24* 19  < > 16 13 9   CREATININE 1.02  < > 0.96 0.89 0.82  < > 0.84 0.79 0.82  CALCIUM 7.9*  < > 7.9* 7.9* 7.9*  < > 7.9* 8.3* 8.2*  MG 1.6*  --  2.2 2.2 2.2  --   --   --   --   PHOS 4.6  --   --   --   --   --   --   --   --   < > = values in this interval not displayed. Liver Function Tests:  Recent Labs  04/23/16 0421 05/06/16 2353 05/09/16 0540  AST 31 21 22   ALT 30 20 18   ALKPHOS 72 74 59  BILITOT 0.5 0.6 0.4  PROT 5.8* 6.1* 4.8*  ALBUMIN 2.4* 3.0* 2.6*    Recent Labs  04/04/16 1956  LIPASE 19   No results for input(s): AMMONIA in the last 8760 hours. CBC:  Recent Labs  04/05/16 0226  04/23/16 0421  05/06/16 2353  05/08/16 0325 05/09/16 0540 05/10/16 0516  WBC 37.0*  < > 14.2*  < > 10.7*  --  8.3 7.2 7.7  NEUTROABS 35.2*  --  11.6*  --  8.3*  --   --   --   --   HGB 14.0  < > 13.4  < > 13.4  < > 11.5* 11.8* 12.0*  HCT 45.3  < > 43.6  < > 42.4  < > 36.6* 36.6* 38.3*  MCV 90.6  < > 91.6  < > 92.4  --  91.3 91.0 92.1  PLT  279  < > 277  < > 361  --  316 299 303  < > = values in this interval not displayed. Cardiac Enzymes:  Recent Labs  04/05/16 0300 04/05/16 0550  TROPONINI 0.04* 0.05*   BNP: Invalid input(s): POCBNP CBG:  Recent Labs  05/08/16 0729 05/09/16 0749 05/10/16 0741  GLUCAP 78 87 96    Radiological Exams: Ct Head Wo  Contrast  Result Date: 04/26/2016 CLINICAL DATA:  Left arm weakness noticed this morning while eating breakfast. EXAM: CT HEAD WITHOUT CONTRAST TECHNIQUE: Contiguous axial images were obtained from the base of the skull through the vertex without intravenous contrast. COMPARISON:  03/21/2016 brain MRI FINDINGS: Brain: There is known extra axial mass deep to the high right bone flap consistent with meningioma. Multiple areas of marginal nodular disease are poorly seen compared to previous MRI. There is known invasion of the superior sagittal sinus. White matter low-density without notable mass effect in the posterior right frontal, right parietal, and right occipital regions is stable from previous. No superimposed infarct, hemorrhage, or hydrocephalus. Left posterior subdural collection on the previous study is not visualized today. Vascular: Atherosclerotic calcification. Meningioma is known to invade and/or occlude the superior sagittal sinus. Skull: Stable appearance of 2 high right craniotomy sites Sinuses/Orbits: Bilateral cataract resection.  No acute finding. Other: None. IMPRESSION: 1. No acute finding when compared to 03/21/2016 MRI. 2. Multi focal meningioma along the posterior right cerebral convexity with stable vasogenic edema and/or white matter gliosis. Electronically Signed   By: Monte Fantasia M.D.   On: 04/26/2016 10:44   Ct Angio Chest Pe W Or Wo Contrast  Result Date: 04/23/2016 CLINICAL DATA:  Pulmonary emboli identified in the right lower lobe on CT abdomen performed earlier today. Patient is status post recent laparotomy with gastric perforation repair and placement of gastrostomy tube. EXAM: CT ANGIOGRAPHY CHEST WITH CONTRAST TECHNIQUE: Multidetector CT imaging of the chest was performed using the standard protocol during bolus administration of intravenous contrast. Multiplanar CT image reconstructions and MIPs were obtained to evaluate the vascular anatomy. CONTRAST:  80 cc Isovue 370  COMPARISON:  CT abdomen from earlier today. FINDINGS: Mediastinum/Lymph Nodes: Moderate to large amount of pulmonary embolus is seen at the distal aspects of each main pulmonary artery extending into the central segmental pulmonary artery branches to each lobe bilaterally. Nearly occlusive thrombus is seen within the bilateral intralobar pulmonary artery branches extending into multiple segmental pulmonary artery branches the lower lobes bilaterally. There is occlusive pulmonary embolus within the proximal aspects of the lingular pulmonary artery branch. Thoracic aorta is normal in caliber. Scattered atherosclerotic changes noted along the walls of the thoracic aorta. Heart size is normal. No pericardial effusion. Coronary artery calcifications noted. No masses or enlarged lymph nodes seen within the mediastinum or perihilar regions. As noted on the earlier CT abdomen, there is a circumscribed-appearing collection within the lower mediastinum, adjacent to the anterior margin of the lower esophagus, measuring 2.8 x 1.7 cm, suspicious for early developing abscess. Lungs/Pleura: Small left pleural effusion with adjacent atelectasis. Trace right pleural effusion with adjacent atelectasis. Small ill-defined ground-glass opacities within the upper lobes bilaterally, likely mild edema. Pulmonary nodule within the medial aspects of the right upper lobe measures 1.3 x 1.1 cm (series 6, image 36). Additional nodule within the lingula measures 6 mm (series 6, image 41). Upper abdomen: Small layering stones within the otherwise normal-appearing gallbladder. Small low-density collection adjacent to the lateral margin of the stomach cardia, as described on the  earlier abdomen CT. Musculoskeletal: No acute or suspicious osseous finding. Mild degenerative spurring within the thoracic spine. Mild compression deformity of an upper thoracic vertebral body appears stable. Superficial soft tissues are unremarkable. Review of the MIP  images confirms the above findings. IMPRESSION: 1. Moderate to large amount of pulmonary emboli bilaterally. This includes occlusive pulmonary embolus within the lingular pulmonary artery branch and nearly occlusive pulmonary emboli within the distal aspects of each main pulmonary artery extending into multiple segmental lower lobe pulmonary artery branches bilaterally. There is CT evidence of right heart strain (with RV/LV Ratio greater than 1) consistent with at least submassive (intermediate risk) PE. The presence of right heart strain has been associated with an increased risk of morbidity and mortality. Please activate Code PE by paging [phone]. 2. Small left pleural effusion and trace right pleural effusion. 3. Pulmonary nodule within the right upper lobe measuring 1.3 x 1.1 cm. This is new compared to an older chest CT of 04/06/2009. Neoplastic pulmonary nodule is not excluded. Consider further characterization with PET-CT after current issues are resolved. 4. Circumscribed fluid density collection within the lower mediastinum, adjacent to the anterior margin of the distal esophagus, measuring 2.8 x 1.7 cm, better demonstrated on the CT abdomen performed earlier today. On the earlier CT abdomen, this had an appearance suspicious for early developing abscess. Additional possible abscess versus expected postoperative fluid collection was also appreciated adjacent to the stomach cardia. 5. Aortic atherosclerosis. These results were called by telephone at the time of interpretation on 04/23/2016 at 3:24 p.m. to Dr. Denton Brick, who verbally acknowledged these results. Electronically Signed   By: Franki Cabot M.D.   On: 04/23/2016 15:31   Ct Abdomen Pelvis W Contrast  Result Date: 04/23/2016 CLINICAL DATA:  Discharge from hospital on July 28th after abdominal surgery. Now with sharp pains at surgical site. Hematemesis last night. Status post recent laparotomy with repair of gastric perforation and placement of  gastrostomy tube. EXAM: CT ABDOMEN AND PELVIS WITH CONTRAST TECHNIQUE: Multidetector CT imaging of the abdomen and pelvis was performed using the standard protocol following bolus administration of intravenous contrast. CONTRAST:  139mL ISOVUE-300 IOPAMIDOL (ISOVUE-300) INJECTION 61% COMPARISON:  CT abdomen status 04/04/2016 09/11/2015. FINDINGS: Lower chest: Small left pleural effusion and mild bibasilar atelectasis. Multiple acute appearing pulmonary emboli are seen within the segmental pulmonary artery branches to the right lower lobe. Hepatobiliary: Small layering stones within the otherwise normal-appearing gallbladder. Liver appears normal. Pancreas: Partially infiltrated with fat but otherwise unremarkable. Spleen: Within normal limits in size and appearance. Adrenals/Urinary Tract: Adrenal glands appear normal. Kidneys appear normal without mass, stone or hydronephrosis. No ureteral or bladder calculi identified. Bladder appears normal. Stomach/Bowel: Small hiatal hernia, significantly improved in appearance compared to the recent CT of 04/04/2016. There is a circumscribed fluid collection within the lower mediastinum, just to the left of the anterior margin of the lower esophagus, measuring 2.7 x 1.6 cm, suspicious for early developing abscess. Additional smaller less well circumscribed low-density collection is seen adjacent to the stomach cardia, measuring approximately 2.6 x 1.5 cm, possible additional early abscess formation. No dilated large or small bowel loops. Gastrostomy tube appears well positioned. Scattered diverticulosis within the descending and sigmoid colon without evidence of acute diverticulitis. Vascular/Lymphatic: Scattered atherosclerotic changes of the normal caliber abdominal aorta. No enlarged lymph nodes seen. Reproductive: No mass or other significant abnormality. Other: No free intraperitoneal air. Musculoskeletal: Degenerative changes throughout the slightly scoliotic  thoracolumbar spine. No acute or suspicious osseous finding. IMPRESSION: 1.  Interval surgical changes of the stomach, compatible with the given history of recent repair of gastric perforation and placement of gastrostomy tube. Circumscribed fluid density collection within the lower mediastinum, chest to the left of the anterior margin of the lower esophagus, measuring 2.7 x 1.6 cm, possibly expected postoperative fluid collection but suspicious for early developing abscess collection. Associated perforation cannot be excluded, but no extraluminal air to confirm. 2. Poorly defined low-density collection adjacent to the stomach cardia, measuring approximately 2.6 x 1.5 cm, expected postsurgical fluid versus additional early developing abscess. 3. Acute appearing pulmonary emboli within segmental pulmonary artery branches to the right lower lobe. Would consider CT pulmonary angiogram for more complete characterization of the pulmonary arteries. 4. Gastrostomy tube appears well positioned. 5. No bowel obstruction.  No free intraperitoneal air. 6. Colonic diverticulosis without evidence of acute diverticulitis. 7. Cholelithiasis without evidence of acute cholecystitis. 8. Aortic atherosclerosis. 9. Small left pleural effusion. Critical Value/emergent results were called by telephone at the time of interpretation on 04/23/2016 at 12:22 pm to Dr. Johnney Killian, who verbally acknowledged these results. Electronically Signed   By: Franki Cabot M.D.   On: 04/23/2016 12:24   Dg Chest Port 1 View  Result Date: 05/08/2016 CLINICAL DATA:  Short of breath. Cough. Pulmonary emboli on prior CT. EXAM: PORTABLE CHEST 1 VIEW COMPARISON:  Plain film and CT of 04/23/2016. FINDINGS: Midline trachea. Patient rotated right. Mild cardiomegaly. No pleural effusion or pneumothorax. Clear lungs. No congestive failure. IMPRESSION: No acute cardiopulmonary disease. Cardiomegaly without congestive failure. Electronically Signed   By: Abigail Miyamoto  M.D.   On: 05/08/2016 11:06   Dg Chest Port 1 View  Result Date: 04/23/2016 CLINICAL DATA:  82 year old male with cough and hypotension EXAM: PORTABLE CHEST 1 VIEW COMPARISON:  Chest radiograph dated 04/08/2016 FINDINGS: Single portable view of chest demonstrates emphysematous changes of the lungs. There is mild eventration of the left hemidiaphragm. No focal consolidation, pleural effusion, or pneumothorax. Stable cardiac silhouette. Osteopenia with degenerative changes of the shoulders. No acute osseous pathology. IMPRESSION: No active disease. Electronically Signed   By: Anner Crete M.D.   On: 04/23/2016 04:56   Dg Swallowing Func-speech Pathology  Result Date: 05/10/2016 Objective Swallowing Evaluation: Type of Study: MBS-Modified Barium Swallow Study Patient Details Name: ELAN OHORA MRN: [id number] Date of Birth: 1931/05/08 Today's Date: 05/10/2016 Time: SLP Start Time (ACUTE ONLY): 0806-SLP Stop Time (ACUTE ONLY): 0830 SLP Time Calculation (min) (ACUTE ONLY): 24 min Past Medical History: Past Medical History: Diagnosis Date . Anemia  . Aortic insufficiency 08/11/2015 . Basal cell cancer  . Brain tumor ([place])  . GERD (gastroesophageal reflux disease)  . Herniated disc   lumbar spine . Hiatal hernia  . History of radiation therapy   back approximately 2010 prostate radiation under care of Dr. Valere [name]  . Hyperlipidemia  . Hypertension  . Meningioma ([place])   brain . Nocturia  . Occult blood in stools  . OSA (obstructive sleep apnea) 10/28/2014  Moderate with AHI 21/hr . Prostate cancer ([place])  . Restless leg  . Seizures ([place])  Past Surgical History: Past Surgical History: Procedure Laterality Date . APPLICATION OF CRANIAL NAVIGATION N/A [id number]  Procedure: APPLICATION OF CRANIAL NAVIGATION;  Surgeon: Paul Lose, MD;  Location: [place] [facility];  Service: Neurosurgery;  Laterality: N/A;  APPLICATION OF CRANIAL NAVIGATION . Bilateral cateract surgery   . COLONOSCOPY    colon polyp removed . CRANIOTOMY Right  09/27/2013  Procedure: RIGHT FRONTAL CRANIOTOMY FOR TUMOR RESECTION ;  Surgeon:  Paul Lose, MD;  Location: [place] [facility];  Service: Neurosurgery;  Laterality: Right; . CRANIOTOMY Right 02/17/2015  Procedure: Right frontal parietal craniotomy for resection of meningioma with brainlab;  Surgeon: Paul Lose, MD;  Location: [place] [facility];  Service: Neurosurgery;  Laterality: Right;  Right frontal parietal craniotomy for resection of meningioma with brainlab . ESOPHAGOGASTRODUODENOSCOPY N/A 05/07/2016  Procedure: ESOPHAGOGASTRODUODENOSCOPY (EGD);  Surgeon: [facility], MD;  Location: [facility];  Service: Endoscopy;  Laterality: N/A; . JOINT REPLACEMENT    right knee replacement . LAPAROTOMY N/A 04/04/2016  Procedure: EXPLORATORY LAPAROTOMY, REPAIR OF GASTRIC PERFORATION WITH OMENTAL PATCH, PLACEMENT OF GASTROSTOMY TUBE;  Surgeon: Paul Messing III, MD;  Location: [facility];  Service: General;  Laterality: N/A; . PROSTATE BIOPSY   . TONSILLECTOMY    Agae 6-7 . VASECTOMY   HPI: 82 yo male adm [facility] with coffee ground emesis and abdomen pain.  Pt with PMH + for abdominal pain s/p GI surgery with PEG placed 7/17 (since removed).  Pt was noted to cough with liquids after surgery in July 2017 and was placed on nectar thick liquids- which he has been consuming since that time.  PMH + for meningioma s/p XRT and surgery, anemia, seizures.  Mr Biddix discharged to SNF before this readmit.  Swallow evaluation ordered.   Subjective: pt awake in chair Assessment / Plan / Recommendation CH[facility] IP CLINICAL IMPRESSIONS 05/10/2016 Therapy Diagnosis Mild pharyngeal phase dysphagia Clinical Impression Minimal oral and mild pharyngeal dysphagia without aspiration of any consistency tested.  Pt with marked improved swallow compared to prior MBS last month.  Trace penetration of thin x1 due to decreased laryngeal closure cleared with cued throat clearing.  Overall pharyngeal swallow strong with only minimal vallecular residuals that are cleared  with liquid and dry swallows.  Of note, pt did cough during MBS without barium visualized in larynx.  Using live video, educated pt to findings and reinforced effective compensation strategies.  Pt did appear with minimal delay in clearance of proximal esophagus of pudding - without sensation - liquids aided clearance.  Voice noted to be wet x1 after MBS.  Pt prefers pills with puree - therefore recommend to continue.  Recommend brief follow up for dysphagia management.   Impact on safety and function Mild aspiration risk   CH[facility] IP TREATMENT RECOMMENDATION 05/10/2016 Treatment Recommendations Therapy as outlined in treatment plan below   Prognosis 05/09/2016 Prognosis for Safe Diet Advancement Good Barriers to Reach Goals -- Barriers/Prognosis Comment -- [facility] IP DIET RECOMMENDATION 05/10/2016 SLP Diet Recommendations Regular solids;Thin liquid Liquid Administration via Cup;Straw Medication Administration Whole meds with puree Compensations Slow rate;Small sips/bites;Clear throat intermittently Postural Changes Remain semi-upright after after feeds/meals (Comment);Seated upright at 90 degrees   CHL IP OTHER RECOMMENDATIONS 05/10/2016 Recommended Consults -- Oral Care Recommendations Oral care BID Other Recommendations --   CHL IP FOLLOW UP RECOMMENDATIONS 05/10/2016 Follow up Recommendations Skilled Nursing facility   [facility] IP FREQUENCY AND DURATION 05/10/2016 Speech Therapy Frequency (ACUTE ONLY) min 1 x/week Treatment Duration 1 week      CHL IP ORAL PHASE 05/10/2016 Oral Phase WFL Oral - Pudding Teaspoon -- Oral - Pudding Cup -- Oral - Honey Teaspoon -- Oral - Honey Cup -- Oral - Nectar Teaspoon -- Oral - Nectar Cup WFL Oral - Nectar Straw -- Oral - Thin Teaspoon WFL Oral - Thin Cup WFL Oral - Thin Straw WFL Oral - Puree WFL Oral - Mech Soft -- Oral - Regular WFL Oral - Multi-Consistency WFL Oral -  Pill WFL Oral Phase - Comment --  CHL IP PHARYNGEAL PHASE 05/10/2016 Pharyngeal Phase Impaired Pharyngeal- Pudding Teaspoon --  Pharyngeal -- Pharyngeal- Pudding Cup -- Pharyngeal -- Pharyngeal- Honey Teaspoon -- Pharyngeal -- Pharyngeal- Honey Cup -- Pharyngeal -- Pharyngeal- Nectar Teaspoon -- Pharyngeal -- Pharyngeal- Nectar Cup WFL Pharyngeal -- Pharyngeal- Nectar Straw -- Pharyngeal -- Pharyngeal- Thin Teaspoon WFL Pharyngeal -- Pharyngeal- Thin Cup Reduced tongue base retraction;Pharyngeal residue - valleculae;Penetration/Aspiration during swallow Pharyngeal Material enters airway, remains ABOVE vocal cords then ejected out Pharyngeal- Thin Straw WFL Pharyngeal -- Pharyngeal- Puree Delayed swallow initiation-vallecula;Pharyngeal residue - valleculae Pharyngeal -- Pharyngeal- Mechanical Soft -- Pharyngeal -- Pharyngeal- Regular Delayed swallow initiation-vallecula Pharyngeal -- Pharyngeal- Multi-consistency -- Pharyngeal -- Pharyngeal- Pill WFL Pharyngeal -- Pharyngeal Comment --  CHL IP CERVICAL ESOPHAGEAL PHASE 05/10/2016 Cervical Esophageal Phase WFL Pudding Teaspoon -- Pudding Cup -- Honey Teaspoon -- Honey Cup -- Nectar Teaspoon -- Nectar Cup -- Nectar Straw -- Thin Teaspoon -- Thin Cup -- Thin Straw -- Puree -- Mechanical Soft -- Regular -- Multi-consistency -- Pill -- Cervical Esophageal Comment -- No flowsheet data found. Janett Labella [place], [facility] SLP [phone]              Dg Swallowing Func-speech Pathology  Result Date: 04/13/2016 Objective Swallowing Evaluation: Type of Study: MBS-Modified Barium Swallow Study Patient Details Name: JATAVIOUS GEHRMAN MRN: [id number] Date of Birth: 06-26-1931 Today's Date: 04/13/2016 Time: SLP Start Time (ACUTE ONLY): 1430-SLP Stop Time (ACUTE ONLY): 1500 SLP Time Calculation (min) (ACUTE ONLY): 30 min Past Medical History: Past Medical History: Diagnosis Date . Anemia  . Aortic insufficiency 08/11/2015 . Basal cell cancer  . Brain tumor ([place])  . GERD (gastroesophageal reflux disease)  . Herniated disc   lumbar spine . Hiatal hernia  . History of radiation therapy   back  approximately 2010 prostate radiation under care of Dr. Valere [name]  . Hyperlipidemia  . Hypertension  . Meningioma ([place])   brain . Nocturia  . Occult blood in stools  . OSA (obstructive sleep apnea) 10/28/2014  Moderate with AHI 21/hr . Prostate cancer ([place])  . Restless leg  . Seizures ([place])  Past Surgical History: Past Surgical History: Procedure Laterality Date . APPLICATION OF CRANIAL NAVIGATION N/A [id number]  Procedure: APPLICATION OF CRANIAL NAVIGATION;  Surgeon: Paul Lose, MD;  Location: [place] [facility];  Service: Neurosurgery;  Laterality: N/A;  APPLICATION OF CRANIAL NAVIGATION . Bilateral cateract surgery   . COLONOSCOPY    colon polyp removed . CRANIOTOMY Right 09/27/2013  Procedure: RIGHT FRONTAL CRANIOTOMY FOR TUMOR RESECTION ;  Surgeon: Paul Lose, MD;  Location: [place] [facility];  Service: Neurosurgery;  Laterality: Right; . CRANIOTOMY Right 02/17/2015  Procedure: Right frontal parietal craniotomy for resection of meningioma with brainlab;  Surgeon: Paul Lose, MD;  Location: [place] [facility];  Service: Neurosurgery;  Laterality: Right;  Right frontal parietal craniotomy for resection of meningioma with brainlab . JOINT REPLACEMENT    right knee replacement . LAPAROTOMY N/A 04/04/2016  Procedure: EXPLORATORY LAPAROTOMY, REPAIR OF GASTRIC PERFORATION WITH OMENTAL PATCH, PLACEMENT OF GASTROSTOMY TUBE;  Surgeon: Paul Messing III, MD;  Location: [facility];  Service: General;  Laterality: N/A; . PROSTATE BIOPSY   . TONSILLECTOMY    Agae 6-7 . VASECTOMY   HPI: 82 yo male adm to [facility] with abdominal pain - pt is s/p surgery with PEG placed 7/17 with intubation a few days.  Per wife, pt has been in ICU and has not had po until yesterday.  Pt started po intake yesteday and was coughing with liquids, therefore diet was changed to nectar liquids and SLP ordered.   Diet was advanced to regular/thin today.  Pt PMH + for meningioma s/p XRT and surgery, anemia, seizures.  Pt CXR shows atelectasis.   Subjective: pt seen in  radiology for MBS Assessment / Plan / Recommendation CHL IP CLINICAL IMPRESSIONS 04/13/2016 Therapy Diagnosis Mild oral phase dysphagia;Mild pharyngeal phase dysphagia Clinical Impression Pt presents with mild oropharyngeal dysphagia, characterized by poor bolus formation of puree and solid with posterior spillage to the vallecula, and right anterior leakage of liquids. Swallow reflex was delayed, with trigger at the vallecula on the initial swallow. Poor airway closure resulted in flash penetration of thin liquids during the initial swallow. Nectar thick liquid prevented penetration of the initial swallow, however, Residue of thin and nectar thick liquid on tongue base and vallecula was noted to spill to the pyriform prior to trigger of (cued) dry swallow.  Small boluses and immediate second swallow prevented penetration of nectar thick liquids. Recommend dys 3 solids with chopped meats and nectar thick liquids, SMALL bites and sips at a SLOW rate, with 2 swallows per bite/sip. Meds one at a time with puree. RN was notified of results and recommendations, and safe swallow precautions were posted at [facility]. ST will follow for education and assessment of diet tolerance. Recommend repeat MBS prior to advancing liquids, given lack of reflexive response to penetration on this study. Impact on safety and function Risk for inadequate nutrition/hydration;Moderate aspiration risk;Mild aspiration risk   CHL IP TREATMENT RECOMMENDATION 04/13/2016 Treatment Recommendations Therapy as outlined in treatment plan below   Prognosis 04/13/2016 Prognosis for Safe Diet Advancement Guarded Barriers to Reach Goals Motivation CHL IP DIET RECOMMENDATION 04/13/2016 SLP Diet Recommendations Dysphagia 3 (Mech soft) solids;Nectar thick liquid Liquid Administration via No straw;Cup Medication Administration Whole meds with puree Compensations Slow rate;Small sips/bites;Clear throat intermittently;Other (Comment);Minimize environmental  distractions;Multiple dry swallows after each bite/sip Postural Changes Seated upright at 90 degrees   CHL IP OTHER RECOMMENDATIONS 04/13/2016 Recommended Consults -- Oral Care Recommendations Oral care BID Other Recommendations Order thickener from pharmacy;Prohibited food (jello, ice cream, thin soups);Remove water pitcher   CHL IP FOLLOW UP RECOMMENDATIONS 04/12/2016 Follow up Recommendations To be determined   CHL IP FREQUENCY AND DURATION 04/13/2016 Speech Therapy Frequency (ACUTE ONLY) min 2x/week Treatment Duration 1 week    CHL IP ORAL PHASE 04/13/2016 Oral Phase Impaired Oral - Nectar Cup Right anterior bolus loss Oral - Thin Cup Right anterior bolus loss Oral - Puree Reduced posterior propulsion;Delayed oral transit;Premature spillage Oral - Multi-Consistency Premature spillage;Delayed oral transit;Reduced posterior propulsion  CHL IP PHARYNGEAL PHASE 04/13/2016 Pharyngeal Phase Impaired Pharyngeal- Nectar Cup Delayed swallow initiation-vallecula;Reduced pharyngeal peristalsis;Reduced anterior laryngeal mobility;Reduced laryngeal elevation;Reduced airway/laryngeal closure;Reduced tongue base retraction;Penetration/Aspiration during swallow;Pharyngeal residue - valleculae;Delayed swallow initiation-pyriform sinuses Pharyngeal Material enters airway, remains ABOVE vocal cords then ejected out Pharyngeal- Thin Cup Delayed swallow initiation-vallecula;Delayed swallow initiation-pyriform sinuses;Reduced pharyngeal peristalsis;Reduced anterior laryngeal mobility;Reduced laryngeal elevation;Reduced airway/laryngeal closure;Pharyngeal residue - valleculae;Reduced tongue base retraction;Penetration/Aspiration during swallow Pharyngeal Material enters airway, remains ABOVE vocal cords then ejected out Pharyngeal- Puree Delayed swallow initiation-vallecula;Reduced tongue base retraction Pharyngeal- Multi-consistency Delayed swallow initiation-vallecula;Reduced tongue base retraction  CHL IP CERVICAL ESOPHAGEAL PHASE  04/13/2016 Cervical Esophageal Phase [facility] Celia B. [place], [facility], CCC-SLP [id number] Shonna Chock 04/13/2016, 3:19 PM               Assessment/Plan   Physical deconditioning With generalized weakness and recurrent hospitalization.  Will have patient work with PT/OT as tolerated to regain strength and restore function.  Fall precautions are in place.  Erosive esophagitis Continue protonix 40 mg bid, has follow up with GI  Acute blood loss anemia Post gi bleed. Continue protonix and monitor for bleed. Monitor cbc  Acute pulmonary embolism With right heart strain, breathing stable at present. Continue xarelto 15 mg bid for now until 05/18/16 and then 20 mg daily from 05/19/16. Monitor his breathing. Continue prn albuterol  Dysphagia SLP to follow, aspiration precautions  Perforated gastric ulcer S/p surgical repair. Continue wound care. Follow up with his surgeon today. Continue tylenol 650 mg q4h prn pain. continue protonix and famotidine  Surgical wound To old feeding tube takedown site and surgical midline incision. Continue wound care by treatment nurse and monitor. Off wound vac now.   Constipation Continue miralax daily  HTN He is off all antihypertensive agent. Monitor BP q shift for now  Cough Continue robitussin 5 ml q4h prn cough and monitor  Meningioma Recurrent. Continue his slow taper of decadron to be completed on 05/13/16. Continue lamictal and keppra for seizure prevention  RLS Continue his requip and monitor   Goals of care: short term rehabilitation   Labs/tests ordered: cbc, cmp 05/16/16    Family/ staff Communication: reviewed care plan with patient, his wifeand nursing supervisor    Blanchie Serve, MD Internal Medicine [place], [place] [address] Cell Phone (Monday-Friday 8 am - 5 pm): [phone] On Call: [phone] and follow prompts after 5 pm and on weekends Office Phone:  [phone] Office Fax: [phone]

## 2016-05-16 LAB — CBC AND DIFFERENTIAL
HCT: 42 % (ref 41–53)
HEMOGLOBIN: 13.1 g/dL — AB (ref 13.5–17.5)
PLATELETS: 354 10*3/uL (ref 150–399)
WBC: 7.7 10*3/mL

## 2016-05-16 LAB — HEPATIC FUNCTION PANEL
ALK PHOS: 76 U/L (ref 25–125)
ALT: 15 U/L (ref 10–40)
AST: 15 U/L (ref 14–40)
Bilirubin, Total: 0.2 mg/dL

## 2016-05-16 LAB — BASIC METABOLIC PANEL
BUN: 9 mg/dL (ref 4–21)
CREATININE: 0.8 mg/dL (ref 0.6–1.3)
Glucose: 96 mg/dL
Potassium: 3.7 mmol/L (ref 3.4–5.3)
Sodium: 138 mmol/L (ref 137–147)

## 2016-06-01 ENCOUNTER — Non-Acute Institutional Stay (SKILLED_NURSING_FACILITY): Payer: Medicare Other | Admitting: Internal Medicine

## 2016-06-01 ENCOUNTER — Encounter: Payer: Self-pay | Admitting: Internal Medicine

## 2016-06-01 ENCOUNTER — Telehealth: Payer: Self-pay | Admitting: Radiation Oncology

## 2016-06-01 DIAGNOSIS — D329 Benign neoplasm of meninges, unspecified: Secondary | ICD-10-CM

## 2016-06-01 DIAGNOSIS — I2609 Other pulmonary embolism with acute cor pulmonale: Secondary | ICD-10-CM | POA: Diagnosis not present

## 2016-06-01 DIAGNOSIS — F05 Delirium due to known physiological condition: Secondary | ICD-10-CM

## 2016-06-01 DIAGNOSIS — D509 Iron deficiency anemia, unspecified: Secondary | ICD-10-CM | POA: Diagnosis not present

## 2016-06-01 DIAGNOSIS — R410 Disorientation, unspecified: Secondary | ICD-10-CM

## 2016-06-01 NOTE — Telephone Encounter (Signed)
Phoned patient's wife. No answer. Left message explaining [facility] staff is working to communicate needs to Dr. Bubba Camp.

## 2016-06-01 NOTE — Progress Notes (Addendum)
LOCATION: [name]  PCP:  Melinda Crutch, MD   Code Status: Full Code  Goals of care: Advanced Directive information Advanced Directives 05/07/2016  Does patient have an advance directive? No  Would patient like information on creating an advanced directive? -  Pre-existing out of facility DNR order (yellow form or pink MOST form) -       Extended Emergency Contact Information Primary Emergency Contact: [name],Paul Address: [address]          [place], [place] [address] [place] of [name] Phone: [phone] Mobile Phone: [phone] Relation: Spouse   Allergies  Allergen Reactions  . Asa [Aspirin] Nausea Only  . Other Itching and Rash    Nuts  . Vicodin [Hydrocodone-Acetaminophen] Nausea Only    Chief Complaint  Patient presents with  . Acute Visit    Family Concerns     HPI:  Patient is a 82 y.o. male seen today for acute visit. He has history of meningioma s/p surgery and radiation treatment and was on decardon until 05/13/16. Wife is with him in his room today and would like him to be on some decadron as she feels it helped his functionality better. He is here for rehabilitation post perforated gastric ulcer s/p surgical repair, acute pulmonary emboli and hematemesis with blood loss anemia. He was on xarelto 20 mg daily starting 05/19/16 and for unclear reason has been on xarelto 15 mg bid from 05/26/16. No bleed reported. Per staff, he was confused yesterday and trying to get out of the bed unassisted. He had u/a with c/s sent to rule out UTI. He is alert and oriented this visit.   Review of Systems:  Constitutional: Negative for fever,, diaphoresis. energy level has improved some.  HENT: Negative for headache, congestion, nasal discharge.  Eyes: Negative for double vision and discharge. Wears glasses. Respiratory: Negative for cough, shortness of breath and wheezing.   Cardiovascular: Negative for chest pain.  Gastrointestinal: Negative for  heartburn, nausea, vomiting, abdominal pain.  Genitourinary: Negative for dysuria.  Musculoskeletal: Negative for back pain, fall in the facility.  Skin: Negative for itching, rash.  Neurological: Negative for dizziness. Psychiatric/Behavioral: Negative for depression.    Past Medical History:  Diagnosis Date  . Anemia   . Aortic insufficiency 08/11/2015  . Basal cell cancer   . Brain tumor ([place])   . GERD (gastroesophageal reflux disease)   . Herniated disc    lumbar spine  . Hiatal hernia   . History of radiation therapy    back approximately 2010 prostate radiation under care of Dr. Valere Dross   . Hyperlipidemia   . Hypertension   . Meningioma ([name])    brain  . Nocturia   . Occult blood in stools   . OSA (obstructive sleep apnea) 10/28/2014   Moderate with AHI 21/hr  . Prostate cancer ([place])   . Restless leg   . Seizures ([place])    Past Surgical History:  Procedure Laterality Date  . APPLICATION OF CRANIAL NAVIGATION N/A 02/17/2015   Procedure: APPLICATION OF CRANIAL NAVIGATION;  Surgeon: Consuella Lose, MD;  Location: [facility];  Service: Neurosurgery;  Laterality: N/A;  APPLICATION OF CRANIAL NAVIGATION  . Bilateral cateract surgery    . COLONOSCOPY     colon polyp removed  . CRANIOTOMY Right 09/27/2013   Procedure: RIGHT FRONTAL CRANIOTOMY FOR TUMOR RESECTION ;  Surgeon: Consuella Lose, MD;  Location: [facility];  Service: Neurosurgery;  Laterality: Right;  . CRANIOTOMY Right 02/17/2015  Procedure: Right frontal parietal craniotomy for resection of meningioma with brainlab;  Surgeon: Consuella Lose, MD;  Location: [facility];  Service: Neurosurgery;  Laterality: Right;  Right frontal parietal craniotomy for resection of meningioma with brainlab  . ESOPHAGOGASTRODUODENOSCOPY N/A 05/07/2016   Procedure: ESOPHAGOGASTRODUODENOSCOPY (EGD);  Surgeon: Clarene Essex, MD;  Location: [facility];  Service: Endoscopy;  Laterality: N/A;  . JOINT REPLACEMENT     right knee  replacement  . LAPAROTOMY N/A 04/04/2016   Procedure: EXPLORATORY LAPAROTOMY, REPAIR OF GASTRIC PERFORATION WITH OMENTAL PATCH, PLACEMENT OF GASTROSTOMY TUBE;  Surgeon: Autumn Messing III, MD;  Location: [facility];  Service: General;  Laterality: N/A;  . PROSTATE BIOPSY    . TONSILLECTOMY     Agae 6-7  . VASECTOMY     Social History:   reports that he has never smoked. He quit smokeless tobacco use about 42 years ago. His smokeless tobacco use included Chew. He reports that he does not drink alcohol or use drugs.  Family History  Problem Relation Age of Onset  . Parkinsonism Mother   . Dementia Father     Medications:   Medication List       Accurate as of 06/01/16 12:11 PM. Always use your most recent med list.          acetaminophen 325 MG tablet Commonly known as:  TYLENOL Take 2 tablets (650 mg total) by mouth every 4 (four) hours as needed.   albuterol (2.5 MG/3ML) 0.083% nebulizer solution Commonly known as:  PROVENTIL Take 3 mLs (2.5 mg total) by nebulization every 4 (four) hours as needed for wheezing or shortness of breath.   B-complex with vitamin C tablet Take 1 tablet by mouth daily.   famotidine 20 MG tablet Commonly known as:  PEPCID Take 1 tablet (20 mg total) by mouth 2 (two) times daily.   guaiFENesin 100 MG/5ML Soln Commonly known as:  ROBITUSSIN Take 5 mLs (100 mg total) by mouth every 4 (four) hours as needed for cough or to loosen phlegm.   lamoTRIgine 100 MG tablet Commonly known as:  LAMICTAL Take 1 tablet (100 mg total) by mouth 2 (two) times daily.   levETIRAcetam 500 MG tablet Commonly known as:  KEPPRA 750mg  BID   menthol-cetylpyridinium 3 MG lozenge Commonly known as:  CEPACOL Take 1 lozenge (3 mg total) by mouth as needed for sore throat.   multivitamin-lutein Caps capsule Take 1 capsule by mouth daily.   ondansetron 4 MG disintegrating tablet Commonly known as:  ZOFRAN-ODT Take 1 tablet (4 mg total) by mouth every 6 (six) hours as  needed for nausea.   pantoprazole 40 MG tablet Commonly known as:  PROTONIX Take 1 tablet (40 mg total) by mouth 2 (two) times daily before a meal.   polyethylene glycol packet Commonly known as:  MIRALAX / GLYCOLAX Take 17 g by mouth daily.   rivaroxaban 20 MG Tabs tablet Commonly known as:  XARELTO Take 1 tablet (20 mg total) by mouth daily with supper.   rOPINIRole 3 MG tablet Commonly known as:  REQUIP Take 1.5 mg by mouth 2 (two) times daily.   solifenacin 5 MG tablet Commonly known as:  VESICARE Take 5 mg by mouth every morning.   UNABLE TO FIND Med Name: Med pass 120 mL 3 times daily with meals   vitamin B-12 100 MCG tablet Commonly known as:  CYANOCOBALAMIN Take 100 mcg by mouth daily.       Immunizations: Immunization History  Administered Date(s) Administered  .  Influenza Split 06/19/2014  . PPD Test 04/15/2016, 05/01/2016     Physical Exam: Vitals:   06/01/16 1158  BP: (!) 91/55  Pulse: 91  Resp: 18  Temp: 98.4 F (36.9 C)  TempSrc: Oral  SpO2: 93%  Weight: 175 lb (79.4 kg)  Height: 5\' 6"  (1.676 m)   Body mass index is 28.25 kg/m.  General- elderly frail male, in no acute distress Head- normocephalic, atraumatic Nose- no nasal discharge Throat- moist mucus membrane Eyes- PERRLA, EOMI, no pallor, no icterus, no discharge Neck- no cervical lymphadenopathy Cardiovascular- normal s1,s2, no murmur Respiratory- bilateral clear to auscultation, no wheeze, no rhonchi, no crackles, no use of accessory muscles Abdomen- bowel sounds present, soft, non tender Musculoskeletal- able to move all 4 extremities, generalized weakness with weakness more on left side LUE> LLE, strength to LLE has improved since last visit Neurological- alert and oriented to person, place and time Skin- warm and dry, abdominal wall incision with dressing in place, easy bruising, left shin area skin tear with dressing in place Psychiatry- normal mood and affect   Labs  reviewed: Basic Metabolic Panel:  Recent Labs  04/05/16 0226  04/09/16 0410 04/10/16 0402 04/11/16 0310  05/07/16 [id number] 05/08/16 0325 05/09/16 0540 05/16/16  NA 135  < > 141 141 138  < > 135 135 135 138  K 3.8  < > 2.8* 3.0* 3.6  < > 3.4* 3.8 3.8 3.7  CL 104  < > 108 108 106  < > 103 106 104  --   CO2 21*  < > 28 26 26   < > 26 25 25   --   GLUCOSE 163*  < > 82 94 88  < > 92 75 86  --   BUN 22*  < > 24* 24* 19  < > 16 13 9 9   CREATININE 1.02  < > 0.96 0.89 0.82  < > 0.84 0.79 0.82 0.8  CALCIUM 7.9*  < > 7.9* 7.9* 7.9*  < > 7.9* 8.3* 8.2*  --   MG 1.6*  --  2.2 2.2 2.2  --   --   --   --   --   PHOS 4.6  --   --   --   --   --   --   --   --   --   < > = values in this interval not displayed. Liver Function Tests:  Recent Labs  04/23/16 0421 05/06/16 2353 05/09/16 0540 05/16/16  AST 31 21 22 15   ALT 30 20 18 15   ALKPHOS 72 74 59 76  BILITOT 0.5 0.6 0.4  --   PROT 5.8* 6.1* 4.8*  --   ALBUMIN 2.4* 3.0* 2.6*  --     Recent Labs  04/04/16 1956  LIPASE 19   No results for input(s): AMMONIA in the last 8760 hours. CBC:  Recent Labs  04/05/16 0226  04/23/16 0421  05/06/16 2353  05/08/16 0325 05/09/16 0540 05/10/16 0516 05/16/16  WBC 37.0*  < > 14.2*  < > 10.7*  --  8.3 7.2 7.7 7.7  NEUTROABS 35.2*  --  11.6*  --  8.3*  --   --   --   --   --   HGB 14.0  < > 13.4  < > 13.4  < > 11.5* 11.8* 12.0* 13.1*  HCT 45.3  < > 43.6  < > 42.4  < > 36.6* 36.6* 38.3* 42  MCV 90.6  < > 91.6  < >  92.4  --  91.3 91.0 92.1  --   PLT 279  < > 277  < > 361  --  316 299 303 354  < > = values in this interval not displayed. Cardiac Enzymes:  Recent Labs  04/05/16 0300 04/05/16 0550  TROPONINI 0.04* 0.05*   BNP: Invalid input(s): POCBNP CBG:  Recent Labs  05/08/16 0729 05/09/16 0749 05/10/16 0741  GLUCAP 78 87 96    Radiological Exams: Dg Chest Port 1 View  Result Date: 05/08/2016 CLINICAL DATA:  Short of breath. Cough. Pulmonary emboli on prior CT. EXAM: PORTABLE  CHEST 1 VIEW COMPARISON:  Plain film and CT of 04/23/2016. FINDINGS: Midline trachea. Patient rotated right. Mild cardiomegaly. No pleural effusion or pneumothorax. Clear lungs. No congestive failure. IMPRESSION: No acute cardiopulmonary disease. Cardiomegaly without congestive failure. Electronically Signed   By: Abigail Miyamoto M.D.   On: 05/08/2016 11:06   Dg Swallowing Func-speech Pathology  Result Date: 05/10/2016 Objective Swallowing Evaluation: Type of Study: MBS-Modified Barium Swallow Study Patient Details Name: [name] MRN: [id number] Date of Birth: Feb 01, 1931 Today's Date: 05/10/2016 Time: SLP Start Time (ACUTE ONLY): 0806-SLP Stop Time (ACUTE ONLY): 0830 SLP Time Calculation (min) (ACUTE ONLY): 24 min Past Medical History: Past Medical History: Diagnosis Date . Anemia  . Aortic insufficiency 08/11/2015 . Basal cell cancer  . Brain tumor ([place])  . GERD (gastroesophageal reflux disease)  . Herniated disc   lumbar spine . Hiatal hernia  . History of radiation therapy   back approximately 2010 prostate radiation under care of Dr. Valere Dross  . Hyperlipidemia  . Hypertension  . Meningioma ([place])   brain . Nocturia  . Occult blood in stools  . OSA (obstructive sleep apnea) 10/28/2014  Moderate with AHI 21/hr . Prostate cancer ([place])  . Restless leg  . Seizures ([place])  Past Surgical History: Past Surgical History: Procedure Laterality Date . APPLICATION OF CRANIAL NAVIGATION N/A [id number]  Procedure: APPLICATION OF CRANIAL NAVIGATION;  Surgeon: Consuella Lose, MD;  Location: [facility];  Service: Neurosurgery;  Laterality: N/A;  APPLICATION OF CRANIAL NAVIGATION . Bilateral cateract surgery   . COLONOSCOPY    colon polyp removed . CRANIOTOMY Right 09/27/2013  Procedure: RIGHT FRONTAL CRANIOTOMY FOR TUMOR RESECTION ;  Surgeon: Consuella Lose, MD;  Location: [facility];  Service: Neurosurgery;  Laterality: Right; . CRANIOTOMY Right 02/17/2015  Procedure: Right frontal parietal craniotomy for resection of  meningioma with brainlab;  Surgeon: Consuella Lose, MD;  Location: [facility];  Service: Neurosurgery;  Laterality: Right;  Right frontal parietal craniotomy for resection of meningioma with brainlab . ESOPHAGOGASTRODUODENOSCOPY N/A 05/07/2016  Procedure: ESOPHAGOGASTRODUODENOSCOPY (EGD);  Surgeon: Clarene Essex, MD;  Location: [facility];  Service: Endoscopy;  Laterality: N/A; . JOINT REPLACEMENT    right knee replacement . LAPAROTOMY N/A 04/04/2016  Procedure: EXPLORATORY LAPAROTOMY, REPAIR OF GASTRIC PERFORATION WITH OMENTAL PATCH, PLACEMENT OF GASTROSTOMY TUBE;  Surgeon: Autumn Messing III, MD;  Location: [facility];  Service: General;  Laterality: N/A; . PROSTATE BIOPSY   . TONSILLECTOMY    Agae 6-7 . VASECTOMY   HPI: 82 yo male adm to [facility] with coffee ground emesis and abdomen pain.  Pt with PMH + for abdominal pain s/p GI surgery with PEG placed 7/17 (since removed).  Pt was noted to cough with liquids after surgery in July 2017 and was placed on nectar thick liquids- which he has been consuming since that time.  PMH + for meningioma s/p XRT and surgery, anemia, seizures.  Mr  Staubs discharged to SNF before this readmit.  Swallow evaluation ordered.   Subjective: pt awake in chair Assessment / Plan / Recommendation CHL IP CLINICAL IMPRESSIONS 05/10/2016 Therapy Diagnosis Mild pharyngeal phase dysphagia Clinical Impression Minimal oral and mild pharyngeal dysphagia without aspiration of any consistency tested.  Pt with marked improved swallow compared to prior MBS last month.  Trace penetration of thin x1 due to decreased laryngeal closure cleared with cued throat clearing.  Overall pharyngeal swallow strong with only minimal vallecular residuals that are cleared with liquid and dry swallows.  Of note, pt did cough during MBS without barium visualized in larynx.  Using live video, educated pt to findings and reinforced effective compensation strategies.  Pt did appear with minimal delay in clearance of proximal esophagus  of pudding - without sensation - liquids aided clearance.  Voice noted to be wet x1 after MBS.  Pt prefers pills with puree - therefore recommend to continue.  Recommend brief follow up for dysphagia management.   Impact on safety and function Mild aspiration risk   CHL IP TREATMENT RECOMMENDATION 05/10/2016 Treatment Recommendations Therapy as outlined in treatment plan below   Prognosis 05/09/2016 Prognosis for Safe Diet Advancement Good Barriers to Reach Goals -- Barriers/Prognosis Comment -- CHL IP DIET RECOMMENDATION 05/10/2016 SLP Diet Recommendations Regular solids;Thin liquid Liquid Administration via Cup;Straw Medication Administration Whole meds with puree Compensations Slow rate;Small sips/bites;Clear throat intermittently Postural Changes Remain semi-upright after after feeds/meals (Comment);Seated upright at 90 degrees   CHL IP OTHER RECOMMENDATIONS 05/10/2016 Recommended Consults -- Oral Care Recommendations Oral care BID Other Recommendations --   CHL IP FOLLOW UP RECOMMENDATIONS 05/10/2016 Follow up Recommendations Skilled Nursing facility   [facility] IP FREQUENCY AND DURATION 05/10/2016 Speech Therapy Frequency (ACUTE ONLY) min 1 x/week Treatment Duration 1 week      CHL IP ORAL PHASE 05/10/2016 Oral Phase WFL Oral - Pudding Teaspoon -- Oral - Pudding Cup -- Oral - Honey Teaspoon -- Oral - Honey Cup -- Oral - Nectar Teaspoon -- Oral - Nectar Cup WFL Oral - Nectar Straw -- Oral - Thin Teaspoon WFL Oral - Thin Cup WFL Oral - Thin Straw WFL Oral - Puree WFL Oral - Mech Soft -- Oral - Regular WFL Oral - Multi-Consistency WFL Oral - Pill WFL Oral Phase - Comment --  CHL IP PHARYNGEAL PHASE 05/10/2016 Pharyngeal Phase Impaired Pharyngeal- Pudding Teaspoon -- Pharyngeal -- Pharyngeal- Pudding Cup -- Pharyngeal -- Pharyngeal- Honey Teaspoon -- Pharyngeal -- Pharyngeal- Honey Cup -- Pharyngeal -- Pharyngeal- Nectar Teaspoon -- Pharyngeal -- Pharyngeal- Nectar Cup WFL Pharyngeal -- Pharyngeal- Nectar Straw -- Pharyngeal --  Pharyngeal- Thin Teaspoon WFL Pharyngeal -- Pharyngeal- Thin Cup Reduced tongue base retraction;Pharyngeal residue - valleculae;Penetration/Aspiration during swallow Pharyngeal Material enters airway, remains ABOVE vocal cords then ejected out Pharyngeal- Thin Straw WFL Pharyngeal -- Pharyngeal- Puree Delayed swallow initiation-vallecula;Pharyngeal residue - valleculae Pharyngeal -- Pharyngeal- Mechanical Soft -- Pharyngeal -- Pharyngeal- Regular Delayed swallow initiation-vallecula Pharyngeal -- Pharyngeal- Multi-consistency -- Pharyngeal -- Pharyngeal- Pill WFL Pharyngeal -- Pharyngeal Comment --  CHL IP CERVICAL ESOPHAGEAL PHASE 05/10/2016 Cervical Esophageal Phase WFL Pudding Teaspoon -- Pudding Cup -- Honey Teaspoon -- Honey Cup -- Nectar Teaspoon -- Nectar Cup -- Nectar Straw -- Thin Teaspoon -- Thin Cup -- Thin Straw -- Puree -- Mechanical Soft -- Regular -- Multi-consistency -- Pill -- Cervical Esophageal Comment -- No flowsheet data found. Janett Labella Lehr, [facility] SLP [phone]  Assessment/Plan  Acute Pulmonary embolism Breathing stable. D/c current xarelto order. start xarelto 20 mg daily and monitor for signs of bleed  Meningioma Completed his decadron 05/13/16. Continue lamictal and keppra for seizure precautions. Spoke with Dr Tammi Klippel from radiation oncology over the phone regarding reimaging of his brain and keeping him on maintenance decadron. Monitor clinically for now and will wait to hear back from his office. Has left sided hemiparesis post meningioma and has had some improvement in his strength since last visit. Updated patient and wife about conversation with Dr Tammi Klippel.  Iron def anemia Check cbc  Confusion Alert and oriented this visit. Pending u/a with c/s from yesterday. Monitor clinically. Check cbc and cmp   Family/ staff Communication: reviewed care plan with patient, his wife and nursing supervisor    Blanchie Serve, MD Internal  Medicine [place], [place] [address] Cell Phone (Monday-Friday 8 am - 5 pm): [phone] On Call: [phone] and follow prompts after 5 pm and on weekends Office Phone: [phone] Office Fax: [phone]   Reviewed not from Dr Tammi Klippel. Start dexamethasone 2 mg bid for now until further follow up.

## 2016-06-01 NOTE — Progress Notes (Signed)
  Radiation Oncology         [phone] ________________________________  Name: [name] MRN: [id number]  Date: 06/01/2016  DOB: May 06, 1931  Chart Note:  I reviewed this patient's most recent findings, spoke with Dr. Bubba Camp, and gathered input from the patient's wife.  I think it may be reasonable to try Dexamethasone 2 mg, PO, BID for edema related to his meningioma, to see if this improves left upper extremity paresis.  It is hoped that with low level steroids, his upper body strength will improve to continue PT progress.    ________________________________  [name], M.D.

## 2016-06-02 LAB — CBC AND DIFFERENTIAL
HCT: 41 % (ref 41–53)
Hemoglobin: 12.8 g/dL — AB (ref 13.5–17.5)
PLATELETS: 361 10*3/uL (ref 150–399)
WBC: 7.2 10*3/mL

## 2016-06-02 LAB — HEPATIC FUNCTION PANEL
ALT: 11 U/L (ref 10–40)
AST: 17 U/L (ref 14–40)
Alkaline Phosphatase: 86 U/L (ref 25–125)
Bilirubin, Total: 0.4 mg/dL

## 2016-06-02 LAB — BASIC METABOLIC PANEL
BUN: 9 mg/dL (ref 4–21)
CREATININE: 0.9 mg/dL (ref 0.6–1.3)
GLUCOSE: 91 mg/dL
Potassium: 4 mmol/L (ref 3.4–5.3)
Sodium: 138 mmol/L (ref 137–147)

## 2016-06-08 ENCOUNTER — Non-Acute Institutional Stay (SKILLED_NURSING_FACILITY): Payer: Medicare Other | Admitting: Internal Medicine

## 2016-06-08 ENCOUNTER — Encounter: Payer: Self-pay | Admitting: Internal Medicine

## 2016-06-08 DIAGNOSIS — D320 Benign neoplasm of cerebral meninges: Secondary | ICD-10-CM

## 2016-06-08 DIAGNOSIS — I2609 Other pulmonary embolism with acute cor pulmonale: Secondary | ICD-10-CM

## 2016-06-08 DIAGNOSIS — R531 Weakness: Secondary | ICD-10-CM

## 2016-06-08 DIAGNOSIS — K219 Gastro-esophageal reflux disease without esophagitis: Secondary | ICD-10-CM | POA: Diagnosis not present

## 2016-06-08 DIAGNOSIS — D509 Iron deficiency anemia, unspecified: Secondary | ICD-10-CM

## 2016-06-08 DIAGNOSIS — G40909 Epilepsy, unspecified, not intractable, without status epilepticus: Secondary | ICD-10-CM

## 2016-06-08 NOTE — Progress Notes (Signed)
LOCATION: [name]  PCP:  Paul Crutch, MD   Code Status: Full Code  Goals of care: Advanced Directive information Advanced Directives 05/07/2016  Does patient have an advance directive? No  Would patient like information on creating an advanced directive? -  Pre-existing out of facility DNR order (yellow form or pink MOST form) -       Extended Emergency Contact Information Primary Emergency Contact: [name] Address: [address]          [place], [place] [address] [place] Phone: [phone] Mobile Phone: [phone] Relation: Spouse   Allergies  Allergen Reactions  . Asa [Aspirin] Nausea Only  . Other Itching and Rash    Nuts  . Vicodin [Hydrocodone-Acetaminophen] Nausea Only    Chief Complaint  Patient presents with  . Discharge Note    Discharge Visit     HPI:  Patient is a 82 y.o. male seen today for discharge visit. He has history of meningioma s/p surgery and radiation treatment and has been restarted on decadron. He is here for rehabilitation post perforated gastric ulcer s/p surgical repair, acute pulmonary emboli and hematemesis with blood loss anemia. He has had multiple hospitalization. He remains a high readmission risk. He is seen in his room with his wife present.   Review of Systems:  Constitutional: Negative for fever. Energy level has improved some.  HENT: Negative for headache, congestion, nasal discharge.  Eyes: Negative for double vision and discharge. Wears glasses. Respiratory: Negative for cough, shortness of breath and wheezing.   Cardiovascular: Negative for chest pain.  Gastrointestinal: Negative for heartburn, nausea, vomiting, abdominal pain.  Genitourinary: Negative for dysuria.  Musculoskeletal: Negative for fall in the facility.  Skin: Negative for itching, rash.  Neurological: Negative for dizziness.   Past Medical History:  Diagnosis Date  . Anemia   . Aortic insufficiency 08/11/2015  .  Basal cell cancer   . Brain tumor ([place])   . GERD (gastroesophageal reflux disease)   . Herniated disc    lumbar spine  . Hiatal hernia   . History of radiation therapy    back approximately 2010 prostate radiation under care of Dr. Valere [name]   . Hyperlipidemia   . Hypertension   . Meningioma ([place])    brain  . Nocturia   . Occult blood in stools   . OSA (obstructive sleep apnea) 10/28/2014   Moderate with AHI 21/hr  . Prostate cancer ([place])   . Restless leg   . Seizures ([place])    Past Surgical History:  Procedure Laterality Date  . APPLICATION OF CRANIAL NAVIGATION N/A 02/17/2015   Procedure: APPLICATION OF CRANIAL NAVIGATION;  Surgeon: Consuella Lose, MD;  Location: [facility];  Service: Neurosurgery;  Laterality: N/A;  APPLICATION OF CRANIAL NAVIGATION  . Bilateral cateract surgery    . COLONOSCOPY     colon polyp removed  . CRANIOTOMY Right 09/27/2013   Procedure: RIGHT FRONTAL CRANIOTOMY FOR TUMOR RESECTION ;  Surgeon: Consuella Lose, MD;  Location: [facility];  Service: Neurosurgery;  Laterality: Right;  . CRANIOTOMY Right 02/17/2015   Procedure: Right frontal parietal craniotomy for resection of meningioma with brainlab;  Surgeon: Consuella Lose, MD;  Location: [facility];  Service: Neurosurgery;  Laterality: Right;  Right frontal parietal craniotomy for resection of meningioma with brainlab  . ESOPHAGOGASTRODUODENOSCOPY N/A 05/07/2016   Procedure: ESOPHAGOGASTRODUODENOSCOPY (EGD);  Surgeon: Clarene Essex, MD;  Location[facility];  Service: Endoscopy;  Laterality: N/A;  . JOINT REPLACEMENT  right knee replacement  . LAPAROTOMY N/A 04/04/2016   Procedure: EXPLORATORY LAPAROTOMY, REPAIR OF GASTRIC PERFORATION WITH OMENTAL PATCH, PLACEMENT OF GASTROSTOMY TUBE;  Surgeon: Autumn Messing III, MD;  Location: [facility];  Service: General;  Laterality: N/A;  . PROSTATE BIOPSY    . TONSILLECTOMY     Agae 6-7  . VASECTOMY      Medications:   Medication List       Accurate as  of 06/08/16 11:42 AM. Always use your most recent med list.          acetaminophen 325 MG tablet Commonly known as:  TYLENOL Take 2 tablets (650 mg total) by mouth every 4 (four) hours as needed.   albuterol (2.5 MG/3ML) 0.083% nebulizer solution Commonly known as:  PROVENTIL Take 3 mLs (2.5 mg total) by nebulization every 4 (four) hours as needed for wheezing or shortness of breath.   B-complex with vitamin C tablet Take 1 tablet by mouth daily.   dexamethasone 2 MG tablet Commonly known as:  DECADRON Take 2 mg by mouth 2 (two) times daily.   famotidine 20 MG tablet Commonly known as:  PEPCID Take 1 tablet (20 mg total) by mouth 2 (two) times daily.   guaiFENesin 100 MG/5ML Soln Commonly known as:  ROBITUSSIN Take 5 mLs (100 mg total) by mouth every 4 (four) hours as needed for cough or to loosen phlegm.   lamoTRIgine 100 MG tablet Commonly known as:  LAMICTAL Take 1 tablet (100 mg total) by mouth 2 (two) times daily.   levETIRAcetam 500 MG tablet Commonly known as:  KEPPRA 728m BID   menthol-cetylpyridinium 3 MG lozenge Commonly known as:  CEPACOL Take 1 lozenge (3 mg total) by mouth as needed for sore throat.   multivitamin-lutein Caps capsule Take 1 capsule by mouth daily.   ondansetron 4 MG disintegrating tablet Commonly known as:  ZOFRAN-ODT Take 1 tablet (4 mg total) by mouth every 6 (six) hours as needed for nausea.   pantoprazole 40 MG tablet Commonly known as:  PROTONIX Take 1 tablet (40 mg total) by mouth 2 (two) times daily before a meal.   polyethylene glycol packet Commonly known as:  MIRALAX / GLYCOLAX Take 17 g by mouth daily.   rivaroxaban 20 MG Tabs tablet Commonly known as:  XARELTO Take 1 tablet (20 mg total) by mouth daily with supper.   rOPINIRole 3 MG tablet Commonly known as:  REQUIP Take 1.5 mg by mouth 2 (two) times daily.   solifenacin 5 MG tablet Commonly known as:  VESICARE Take 5 mg by mouth every morning.   UNABLE TO  FIND Med Name: Med pass 120 mL 3 times daily with meals   vitamin B-12 100 MCG tablet Commonly known as:  CYANOCOBALAMIN Take 100 mcg by mouth daily.       Immunizations: Immunization History  Administered Date(s) Administered  . Influenza Split 06/19/2014  . PPD Test 04/15/2016, 05/01/2016     Physical Exam: Vitals:   06/08/16 1135  BP: 123/85  Pulse: 86  Temp: 98.2 F (36.8 C)  TempSrc: Oral  SpO2: 96%  Weight: 175 lb (79.4 kg)  Height: _0  (1.676 m)   Body mass index is 28.25 kg/m.  General- elderly frail male, in no acute distress Head- normocephalic, atraumatic Throat- moist mucus membrane Eyes- PERRLA, EOMI, no pallor, no icterus, no discharge Neck- no cervical lymphadenopathy Cardiovascular- normal s1,s2, no murmur, trace leg edema Respiratory- bilateral clear to auscultation, no wheeze, no rhonchi, no crackles,  no use of accessory muscles Abdomen- bowel sounds present, soft, non tender Musculoskeletal- able to move all 4 extremities, generalized weakness with weakness more on left side LUE> LLE Neurological- alert and oriented to person, place and time Skin- warm and dry, abdominal wall incision with dressing in place, easy bruising, geri sleeves, dressing to left leg Psychiatry- normal mood and affect   Labs reviewed: Basic Metabolic Panel:  Recent Labs  04/05/16 0226  04/09/16 0410 04/10/16 0402 04/11/16 0310  05/07/16 0131 05/08/16 0325 05/09/16 0540 05/16/16 06/02/16  NA 135  < > 141 141 138  < > 135 135 135 138 138  K 3.8  < > 2.8* 3.0* 3.6  < > 3.4* 3.8 3.8 3.7 4.0  CL 104  < > 108 108 106  < > 103 106 104  --   --   CO2 21*  < > _0 < > _1 --   --   GLUCOSE 163*  < > 82 94 88  < > 92 75 86  --   --   BUN 22*  < > 24* 24* 19  < > _2 CREATININE 1.02  < > 0.96 0.89 0.82  < > 0.84 0.79 0.82 0.8 0.9  CALCIUM 7.9*  < > 7.9* 7.9* 7.9*  < > 7.9* 8.3* 8.2*  --   --   MG 1.6*  --  2.2 2.2 2.2  --   --   --   --   --   --    PHOS 4.6  --   --   --   --   --   --   --   --   --   --   < > = values in this interval not displayed. Liver Function Tests:  Recent Labs  04/23/16 0421 05/06/16 2353 05/09/16 0540 05/16/16 06/02/16  AST _3 ALT _4 ALKPHOS 72 74 59 76 86  BILITOT 0.5 0.6 0.4  --   --   PROT 5.8* 6.1* 4.8*  --   --   ALBUMIN 2.4* 3.0* 2.6*  --   --     Recent Labs  04/04/16 1956  LIPASE 19   No results for input(s): AMMONIA in the last 8760 hours. CBC:  Recent Labs  04/05/16 0226  04/23/16 0421  05/06/16 2353  05/08/16 0325 05/09/16 0540 05/10/16 0516 05/16/16 06/02/16  WBC 37.0*  < > 14.2*  < > 10.7*  --  8.3 7.2 7.7 7.7 7.2  NEUTROABS 35.2*  --  11.6*  --  8.3*  --   --   --   --   --   --   HGB 14.0  < > 13.4  < > 13.4  < > 11.5* 11.8* 12.0* 13.1* 12.8*  HCT 45.3  < > 43.6  < > 42.4  < > 36.6* 36.6* 38.3* 42 41  MCV 90.6  < > 91.6  < > 92.4  --  91.3 91.0 92.1  --   --   PLT 279  < > 277  < > 361  --  316 299 303 354 361  < > = values in this interval not displayed. Cardiac Enzymes:  Recent Labs  04/05/16 0300 04/05/16 0550  TROPONINI 0.04* 0.05*   BNP: Invalid input(s): POCBNP CBG:  Recent Labs  05/08/16 0729 05/09/16 0749 05/10/16 0741  GLUCAP 78  83 96    Radiological Exams: Dg Swallowing Func-speech Pathology  Result Date: 05/10/2016 Objective Swallowing Evaluation: Type of Study: MBS-Modified Barium Swallow Study Patient Details Name: [name] MRN: [id number] Date of Birth: 10-02-1930 Today's Date: 05/10/2016 Time: SLP Start Time (ACUTE ONLY): 0806-SLP Stop Time (ACUTE ONLY): 0830 SLP Time Calculation (min) (ACUTE ONLY): 24 min Past Medical History: Past Medical History: Diagnosis Date . Anemia  . Aortic insufficiency 08/11/2015 . Basal cell cancer  . Brain tumor ([place])  . GERD (gastroesophageal reflux disease)  . Herniated disc   lumbar spine . Hiatal hernia  . History of radiation therapy   back approximately 2010 prostate radiation  under care of Dr. Valere [name]  . Hyperlipidemia  . Hypertension  . Meningioma ([place])   brain . Nocturia  . Occult blood in stools  . OSA (obstructive sleep apnea) 10/28/2014  Moderate with AHI 21/hr . Prostate cancer ([employer])  . Restless leg  . Seizures ([place])  Past Surgical History: Past Surgical History: Procedure Laterality Date . APPLICATION OF CRANIAL NAVIGATION N/A 2/35/5732  Procedure: APPLICATION OF CRANIAL NAVIGATION;  Surgeon: Consuella Lose, MD;  Location: [facility];  Service: Neurosurgery;  Laterality: N/A;  APPLICATION OF CRANIAL NAVIGATION . Bilateral cateract surgery   . COLONOSCOPY    colon polyp removed . CRANIOTOMY Right 09/27/2013  Procedure: RIGHT FRONTAL CRANIOTOMY FOR TUMOR RESECTION ;  Surgeon: Consuella Lose, MD;  Location: [facility];  Service: Neurosurgery;  Laterality: Right; . CRANIOTOMY Right 02/17/2015  Procedure: Right frontal parietal craniotomy for resection of meningioma with brainlab;  Surgeon: Consuella Lose, MD;  Location: [facility];  Service: Neurosurgery;  Laterality: Right;  Right frontal parietal craniotomy for resection of meningioma with brainlab . ESOPHAGOGASTRODUODENOSCOPY N/A 05/07/2016  Procedure: ESOPHAGOGASTRODUODENOSCOPY (EGD);  Surgeon: Clarene Essex, MD;  [facility];  Service: Endoscopy;  Laterality: N/A; . JOINT REPLACEMENT    right knee replacement . LAPAROTOMY N/A 04/04/2016  Procedure: EXPLORATORY LAPAROTOMY, REPAIR OF GASTRIC PERFORATION WITH OMENTAL PATCH, PLACEMENT OF GASTROSTOMY TUBE;  Surgeon: Autumn Messing III, MD;  [facility];  Service: General;  Laterality: N/A; . PROSTATE BIOPSY   . TONSILLECTOMY    Agae 6-7 . VASECTOMY   HPI: 82 yo male adm to [facility] with coffee ground emesis and abdomen pain.  Pt with PMH + for abdominal pain s/p GI surgery with PEG placed 7/17 (since removed).  Pt was noted to cough with liquids after surgery in July 2017 and was placed on nectar thick liquids- which he has been consuming since that time.  PMH + for  meningioma s/p XRT and surgery, anemia, seizures.  Mr [place] discharged to SNF before this readmit.  Swallow evaluation ordered.   Subjective: pt awake in chair Assessment / Plan / Recommendation CHL IP CLINICAL IMPRESSIONS 05/10/2016 Therapy Diagnosis Mild pharyngeal phase dysphagia Clinical Impression Minimal oral and mild pharyngeal dysphagia without aspiration of any consistency tested.  Pt with marked improved swallow compared to prior MBS last month.  Trace penetration of thin x1 due to decreased laryngeal closure cleared with cued throat clearing.  Overall pharyngeal swallow strong with only minimal vallecular residuals that are cleared with liquid and dry swallows.  Of note, pt did cough during MBS without barium visualized in larynx.  Using live video, educated pt to findings and reinforced effective compensation strategies.  Pt did appear with minimal delay in clearance of proximal esophagus of pudding - without sensation - liquids aided clearance.  Voice noted to be wet x1 after  MBS.  Pt prefers pills with puree - therefore recommend to continue.  Recommend brief follow up for dysphagia management.   Impact on safety and function Mild aspiration risk   CHL IP TREATMENT RECOMMENDATION 05/10/2016 Treatment Recommendations Therapy as outlined in treatment plan below   Prognosis 05/09/2016 Prognosis for Safe Diet Advancement Good Barriers to Reach Goals -- Barriers/Prognosis Comment -- CHL IP DIET RECOMMENDATION 05/10/2016 SLP Diet Recommendations Regular solids;Thin liquid Liquid Administration via Cup;Straw Medication Administration Whole meds with puree Compensations Slow rate;Small sips/bites;Clear throat intermittently Postural Changes Remain semi-upright after after feeds/meals (Comment);Seated upright at 90 degrees   CHL IP OTHER RECOMMENDATIONS 05/10/2016 Recommended Consults -- Oral Care Recommendations Oral care BID Other Recommendations --   CHL IP FOLLOW UP RECOMMENDATIONS 05/10/2016 Follow up  Recommendations Skilled Nursing facility   [facility] IP FREQUENCY AND DURATION 05/10/2016 Speech Therapy Frequency (ACUTE ONLY) min 1 x/week Treatment Duration 1 week      CHL IP ORAL PHASE 05/10/2016 Oral Phase WFL Oral - Pudding Teaspoon -- Oral - Pudding Cup -- Oral - Honey Teaspoon -- Oral - Honey Cup -- Oral - Nectar Teaspoon -- Oral - Nectar Cup WFL Oral - Nectar Straw -- Oral - Thin Teaspoon WFL Oral - Thin Cup WFL Oral - Thin Straw WFL Oral - Puree WFL Oral - Mech Soft -- Oral - Regular WFL Oral - Multi-Consistency WFL Oral - Pill WFL Oral Phase - Comment --  CHL IP PHARYNGEAL PHASE 05/10/2016 Pharyngeal Phase Impaired Pharyngeal- Pudding Teaspoon -- Pharyngeal -- Pharyngeal- Pudding Cup -- Pharyngeal -- Pharyngeal- Honey Teaspoon -- Pharyngeal -- Pharyngeal- Honey Cup -- Pharyngeal -- Pharyngeal- Nectar Teaspoon -- Pharyngeal -- Pharyngeal- Nectar Cup WFL Pharyngeal -- Pharyngeal- Nectar Straw -- Pharyngeal -- Pharyngeal- Thin Teaspoon WFL Pharyngeal -- Pharyngeal- Thin Cup Reduced tongue base retraction;Pharyngeal residue - valleculae;Penetration/Aspiration during swallow Pharyngeal Material enters airway, remains ABOVE vocal cords then ejected out Pharyngeal- Thin Straw WFL Pharyngeal -- Pharyngeal- Puree Delayed swallow initiation-vallecula;Pharyngeal residue - valleculae Pharyngeal -- Pharyngeal- Mechanical Soft -- Pharyngeal -- Pharyngeal- Regular Delayed swallow initiation-vallecula Pharyngeal -- Pharyngeal- Multi-consistency -- Pharyngeal -- Pharyngeal- Pill WFL Pharyngeal -- Pharyngeal Comment --  CHL IP CERVICAL ESOPHAGEAL PHASE 05/10/2016 Cervical Esophageal Phase WFL Pudding Teaspoon -- Pudding Cup -- Honey Teaspoon -- Honey Cup -- Nectar Teaspoon -- Nectar Cup -- Nectar Straw -- Thin Teaspoon -- Thin Cup -- Thin Straw -- Puree -- Mechanical Soft -- Regular -- Multi-consistency -- Pill -- Cervical Esophageal Comment -- No flowsheet data found. Janett Labella [place], [facility] [phone]                Assessment/Plan   Generalized weakness He is deconditioned. Will have him work with physical therapy and occupational therapy team to help with gait training and muscle strengthening exercises.fall precautions. Skin care. Encourage to be out of bed.   Acute Pulmonary embolism Breathing stable. continue xarelto 20 mg daily and monitor for signs of bleed  Meningioma Continue dexamethasone 2 mg bid and to follow with radiation oncologyand oncology.   Iron def anemia Monitor cbc with PCP  Seizure disorder With his meningioma, continue lamictal and keppra  gerd Continue famotidine   Patient is being discharged with home health services:  Home health PT, OT, RN, HHA to help with gait and strength training, strengthen him to perform his ADLs, medication management and assistance with ADLs  Patient is being discharged with the following durable medical equipment:  semi electric hospital bed with half rails to  help with his ADLs; standard wheelchair to help with mobility and this cannot be met by a walker. He will also need wheelchair cushion and elevated leg rest; hoyer lift to help with his transfers. He has raised commode and ramp to get inside the house.   Patient has been advised to f/u with their PCP in 1-2 weeks to bring them up to date on their rehab stay.  They were provided with a 30 day supply of scripts for prescription medications and refills must be obtained from their PCP.    Plan of care discussed with patient, his wife and verbalizes understanding this. Reviewed care plan with nursing staff     Blanchie Serve, MD Internal Medicine [place], [place] [address] Cell Phone (Monday-Friday 8 am - 5 pm): [phone] On Call: [phone] and follow prompts after 5 pm and on weekends Office Phone: [phone] Office Fax: [phone]

## 2016-06-28 ENCOUNTER — Ambulatory Visit: Payer: Medicare Other | Admitting: Neurology

## 2016-06-28 ENCOUNTER — Encounter: Payer: Self-pay | Admitting: Neurology

## 2016-06-28 ENCOUNTER — Telehealth: Payer: Self-pay | Admitting: *Deleted

## 2016-06-28 ENCOUNTER — Ambulatory Visit (INDEPENDENT_AMBULATORY_CARE_PROVIDER_SITE_OTHER): Payer: Medicare Other | Admitting: Neurology

## 2016-06-28 VITALS — BP 120/68 | HR 84 | Temp 98.1°F

## 2016-06-28 DIAGNOSIS — G40109 Localization-related (focal) (partial) symptomatic epilepsy and epileptic syndromes with simple partial seizures, not intractable, without status epilepticus: Secondary | ICD-10-CM

## 2016-06-28 DIAGNOSIS — D420 Neoplasm of uncertain behavior of cerebral meninges: Secondary | ICD-10-CM

## 2016-06-28 MED ORDER — LEVETIRACETAM 500 MG PO TABS: ORAL_TABLET | ORAL | 3 refills | Status: AC

## 2016-06-28 MED ORDER — LAMOTRIGINE 100 MG PO TABS: 100.0000 mg | ORAL_TABLET | Freq: Two times a day (BID) | ORAL | 3 refills | Status: AC

## 2016-06-28 NOTE — Telephone Encounter (Signed)
Patient calling to say dr Harrington Challenger instructed him to call dr Alen Blew for an appt. States he missed his last lab/dr/ and infusion visit d/t a fall and then rehab, states he is now in a wheelchair, but would like to re-schedule appts. Okay for first available?

## 2016-06-28 NOTE — Progress Notes (Signed)
NEUROLOGY FOLLOW UP OFFICE NOTE  YAFET SKEES VP:[id number]  HISTORY OF PRESENT ILLNESS: I had the pleasure of seeing Aashish Eckhart in follow-up in the neurology clinic on 06/28/2016. 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 He is again accompanied by his wife who helps supplement the history. Since his last visit, his wife called our office in May to report that he was having more left arm twitching and that his left leg was dragging more than usual. He had been taking Keppra 750mg  BID and Lamotrigine was added, dose increased to 100mg  BID. They deny any further seizures since then, he is tolerating the medications without side effects. Unfortunately since his last visit, he has had several hospital visits for perforated gastric ulcer s/p surgical repair, acute pulmonary emboli, hematemesis with blood loss anemia. There has also been increased nodularity in the previously treated tumor, and he was started on Decadron. I personally reviewed last MRI brain done 03/21/16 which showed progression of the extensive residual en plaque meningioma over the right cerebral convexity, most notably at the posterior nodular component, progression of vasogenic edema and new edema in the superior right occipital lobe. 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   He continues to have trouble picking up his left leg. He feels like there is something in his left hand, with difficulty picking things up. He denies any numbness/tingling, and no further twitching/jerking since May 2017. He denies any headaches, dizziness, diplopia, dysarthria,dysphagia, neck/back pain, bowel/bladder dysfunction.  HPI: This is a very pleasant 82 yo RH man with a history of recurrent grade II meningioma of the right frontoparietal convexity s/p resection followed by salvage fractionated stereotactic radiotherapy, followed by salvage resection with new recurrence.  He reports having 3 seizures, most recently last 07/11/15. On review of previous records, he started having left-sided sensory changes and pain in September 2014, followed by progressive weakness. He was diagnosed with the meningioma in December 2014 and underwent resection last January 2015. Post-op MRI showed complete resection. His 42-month MRI in July 2015 showed recurrent disease and he underwent radiotherapy. In October 2015, he had stopped Decadron without taper and reported 2 episodes of shaking of the left arm that took over his entire body. Decadron was restarted. He then started to have worsening left-sided weakness, with MRI showing fairly significant recurrence, he underwent another resection last May 2016. He was started on Keppra after the surgery, then reports tapering this off. On his 06/26/15, he started reporting episodes of intermittent numbness and loss of control of the left arm, then on 07/11/15, after working in the yard, he was relaxing for 30-40 minutes when his left hand started shaking for 3-5 minutes, followed by flaccidity of the arm. Then his left arm "shot up," he grabbed it but could not pull it down. This was followed by tremendous pain in his arm, "like it was in a vise," he stood up and his left leg felt heavy. He could not get up their porch steps and lay on the steps for a few minutes, then managed to get to the door. Symptoms started easing off after 30 minutes. His wife noticed his speech was slurred. He was weak the next day, and started feeling back to baseline 2 days later. He saw Dr. Tammi Klippel and was started on Keppra 500mg  BID. He reports the episodes of intermittent numbness in his left arm have decreased, still occurring on a daily basis,  feeling like his arm was in a sleeve for a few minutes. If he tries to use his hand, it would become tremulous. His left leg still feels heavy, but he reports these symptoms "since day 1 in 2014." He denies any recent falls. He  occasionally notices that some words don't come out like he wants them to. He denies any headaches, dizziness, diplopia, dysphagia, neck pain, bowel/bladder dysfunction. He has some back pain. He denies any olfactory/gustatory hallucinations, deja vu, rising epigastric sensation.   Diagnostic Data: I personally reviewed MRI brain done 05/15/15 which noted overall progression of meningioma compared to 02/2015 study. There is dural thickening with nodularity which is likely due to tumor along the dura. In addition, several nodular implants have grown significantly including a 9x9mm enhancing mass along the anterior margin of the craniotomy. The 61mm nodule along the posterior aspect of the craniotomy has also progressed. There is tumor filling the superior sagittal sinus as noted previously and this also appears to have progressed. The superior sagittal sinus appears occluded by tumor. There is also some tumor extending through the craniotomy site over the convexity. MRI 11/09/15 overall stable compared to prior scan. Wake and drowsy EEG on 08/07/15 normal.  Epilepsy Risk Factors: Atypical right frontoparietal meningioma s/p craniotomy x 2. Otherwise he had a normal birth and early development. There is no history of febrile convulsions, CNS infections such as meningitis/encephalitis, significant traumatic brain injury, or family history of seizures.  PAST MEDICAL HISTORY: Past Medical History:  Diagnosis Date  . Anemia   . Aortic insufficiency 08/11/2015  . Basal cell cancer   . Brain tumor ([place])   . GERD (gastroesophageal reflux disease)   . Herniated disc    lumbar spine  . Hiatal hernia   . History of radiation therapy    back approximately 2010 prostate radiation under care of Dr. Valere Dross   . Hyperlipidemia   . Hypertension   . Meningioma ([place])    brain  . Nocturia   . Occult blood in stools   . OSA (obstructive sleep apnea) 10/28/2014   Moderate with AHI 21/hr  . Prostate cancer ([place])     . Restless leg   . Seizures ([place])     MEDICATIONS: Current Outpatient Prescriptions on File Prior to Visit  Medication Sig Dispense Refill  . acetaminophen (TYLENOL) 325 MG tablet Take 2 tablets (650 mg total) by mouth every 4 (four) hours as needed. 30 tablet 0  . albuterol (PROVENTIL) (2.5 MG/3ML) 0.083% nebulizer solution Take 3 mLs (2.5 mg total) by nebulization every 4 (four) hours as needed for wheezing or shortness of breath. 75 mL 12  . B Complex-C (B-COMPLEX WITH VITAMIN C) tablet Take 1 tablet by mouth daily.    Marland Kitchen dexamethasone (DECADRON) 2 MG tablet Take 2 mg by mouth 2 (two) times daily.    . famotidine (PEPCID) 20 MG tablet Take 1 tablet (20 mg total) by mouth 2 (two) times daily.    Marland Kitchen guaiFENesin (ROBITUSSIN) 100 MG/5ML SOLN Take 5 mLs (100 mg total) by mouth every 4 (four) hours as needed for cough or to loosen phlegm.  0  . lamoTRIgine (LAMICTAL) 100 MG tablet Take 1 tablet (100 mg total) by mouth 2 (two) times daily. 60 tablet 0  . levETIRAcetam (KEPPRA) 500 MG tablet 750mg  BID    . menthol-cetylpyridinium (CEPACOL) 3 MG lozenge Take 1 lozenge (3 mg total) by mouth as needed for sore throat. 100 tablet 12  . multivitamin-lutein (OCUVITE-LUTEIN) CAPS capsule  Take 1 capsule by mouth daily.    . ondansetron (ZOFRAN-ODT) 4 MG disintegrating tablet Take 1 tablet (4 mg total) by mouth every 6 (six) hours as needed for nausea. 20 tablet 0  . pantoprazole (PROTONIX) 40 MG tablet Take 1 tablet (40 mg total) by mouth 2 (two) times daily before a meal. 60 tablet 0  . polyethylene glycol (MIRALAX / GLYCOLAX) packet Take 17 g by mouth daily. 14 each 0  . rivaroxaban (XARELTO) 20 MG TABS tablet Take 1 tablet (20 mg total) by mouth daily with supper.    Marland Kitchen rOPINIRole (REQUIP) 3 MG tablet Take 1.5 mg by mouth 2 (two) times daily.    . solifenacin (VESICARE) 5 MG tablet Take 5 mg by mouth every morning.    Marland Kitchen UNABLE TO FIND Med Name: Med pass 120 mL 3 times daily with meals    . vitamin B-12  (CYANOCOBALAMIN) 100 MCG tablet Take 100 mcg by mouth daily.     No current facility-administered medications on file prior to visit.     ALLERGIES: Allergies  Allergen Reactions  . Asa [Aspirin] Nausea Only  . Other Itching and Rash    Nuts  . Vicodin [Hydrocodone-Acetaminophen] Nausea Only    FAMILY HISTORY: Family History  Problem Relation Age of Onset  . Parkinsonism Mother   . Dementia Father     SOCIAL HISTORY: Social History   Social History  . Marital status: Married    Spouse name: Mardene Celeste  . Number of children: 2  . Years of education: 12   Occupational History  . Retired, Furniture conservator/restorer    Social History Main Topics  . Smoking status: Never Smoker  . Smokeless tobacco: Former Systems developer    Types: Chew    Quit date: 10/08/1973  . Alcohol use No  . Drug use: No  . Sexual activity: Not Currently   Other Topics Concern  . Not on file   Social History Narrative   Patient is married([name]) lives with wife in [place].  Normally independent of ADLs.   Patient is retired.   Patient has two children.   Patient has a high school education.   Right handed          REVIEW OF SYSTEMS: Constitutional: No fevers, chills, or sweats, no generalized fatigue, change in appetite Eyes: No visual changes, double vision, eye pain Ear, nose and throat: No hearing loss, ear pain, nasal congestion, sore throat Cardiovascular: No chest pain, palpitations Respiratory:  No shortness of breath at rest or with exertion, wheezes GastrointestinaI: No nausea, vomiting, diarrhea, abdominal pain, fecal incontinence Genitourinary:  No dysuria, urinary retention or frequency Musculoskeletal:  No neck pain, back pain Integumentary: No rash, pruritus, skin lesions Neurological: as above Psychiatric: No depression, insomnia, anxiety Endocrine: No palpitations, fatigue, diaphoresis, mood swings, change in appetite, change in weight, increased thirst Hematologic/Lymphatic:  No anemia,  purpura, petechiae. Allergic/Immunologic: no itchy/runny eyes, nasal congestion, recent allergic reactions, rashes  PHYSICAL EXAM: Vitals:   06/28/16 1515  BP: 120/68  Pulse: 84  Temp: 98.1 F (36.7 C)   General: No acute distress, sitting on wheelchair Head:  Normocephalic/atraumatic Neck: supple, no paraspinal tenderness, full range of motion Heart:  Regular rate and rhythm Lungs:  Clear to auscultation bilaterally Back: No paraspinal tenderness Skin/Extremities: No rash, no edema Neurological Exam:alert and oriented to person, place, and time, no dysarthria or aphasia, Fund of knowledge is appropriate. Recent and remote memory are intact.3/3 delayed recall. Attention and concentration are normal. Able to  name objects and repeat phrases. Cranial nerves: CN I: not tested CN II: pupils equal, round and reactive to light, visual fields intact CN III, IV, VI: full range of motion, no nystagmus, no ptosis CN V: intact to light touch CN VII: upper and lower face symmetric CN VIII: hearing intact to finger rub CN IX, X: gag intact, uvula midline CN XI: sternocleidomastoid and trapezius muscles intact CN XII: tongue midline Bulk & Tone: normal, no fasciculations. Motor: 4/5 left UE with decreased fine finger movements on left hand, 4+/5 left LE, with orbiting around the left arm (similar to prior) Sensation: Intact to light touch. Romberg test negative Deep Tendon Reflexes: brisk +2 throughout, no ankle clonus Plantar responses: downgoing bilaterally Cerebellar: no incoordination on finger to nose with mild ataxic hemiparesis on left UE Gait: spastic hemiparetic gait with left arm flexed at the wrist and dragging of left leg (similar to prior), ambulates with cane Tremor: none today  IMPRESSION: This is a pleasant 82 yo RH man with a history of atypical meningioma s/p resection and stereotactic radiation treatment. He had a focal motor seizure last 07/11/15, then was reporting  recurrent episodes of left arm numbness and twitching suggestive of simple partial sensorimotor seizures. Routine EEG normal. He denies any further seizures since May when Lamotrigine dose was increased to 100mg  BID. Continue Keppra 750mg  BID as well. No side effects. There has been interval progression of meningioma, palliative care referral placed. Continue follow-up with Dr. Tammi Klippel. Continue home PT. He is aware of [place] driving laws to stop driving after a seizure, until 6 months seizure-free. He will follow-up in 6 months and knows to call our office for any changes.   Thank you for allowing me to participate in his care.  Please do not hesitate to call for any questions or concerns.  The duration of this appointment visit was 25 minutes of face-to-face time with the patient.  Greater than 50% of this time was spent in counseling, explanation of diagnosis, planning of further management, and coordination of care.   Ellouise Newer, M.D.   CC: Dr. Harrington Challenger, Dr. Tammi Klippel

## 2016-06-28 NOTE — Telephone Encounter (Signed)
He needs Lab + MD + one hour infusion next available.

## 2016-06-28 NOTE — Patient Instructions (Signed)
1. Continue Keppra 500mg : Take 1 & 1/2 tablets twice a day 2. Continue Lamictal 100mg : take 1 tablet twice a day 3. Continue home PT 4. Follow-up in 6 months, call for any changes  Seizure Precautions: 1. If medication has been prescribed for you to prevent seizures, take it exactly as directed.  Do not stop taking the medicine without talking to your doctor first, even if you have not had a seizure in a long time.   2. Avoid activities in which a seizure would cause danger to yourself or to others.  Don't operate dangerous machinery, swim alone, or climb in high or dangerous places, such as on ladders, roofs, or girders.  Do not drive unless your doctor says you may.  3. If you have any warning that you may have a seizure, lay down in a safe place where you can't hurt yourself.    4.  No driving for 6 months from last seizure, as per [facility].   Please refer to the following link on the [place] website for more information: http://[email or web address]/[email or web address]/drivingu.cfm   5.  Maintain good sleep hygiene. Avoid alcohol.  6.  Contact your doctor if you have any problems that may be related to the medicine you are taking.  7.  Call 911 and bring the patient back to the ED if:        A.  The seizure lasts longer than 5 minutes.       B.  The patient doesn't awaken shortly after the seizure  C.  The patient has new problems such as difficulty seeing, speaking or moving  D.  The patient was injured during the seizure  E.  The patient has a temperature over 102 F (39C)  F.  The patient vomited and now is having trouble breathing

## 2016-06-30 ENCOUNTER — Encounter: Payer: Self-pay | Admitting: Neurology

## 2016-07-13 ENCOUNTER — Telehealth: Payer: Self-pay | Admitting: Oncology

## 2016-07-13 ENCOUNTER — Ambulatory Visit (HOSPITAL_BASED_OUTPATIENT_CLINIC_OR_DEPARTMENT_OTHER): Payer: Medicare Other | Admitting: Oncology

## 2016-07-13 ENCOUNTER — Ambulatory Visit (HOSPITAL_BASED_OUTPATIENT_CLINIC_OR_DEPARTMENT_OTHER): Payer: Medicare Other

## 2016-07-13 ENCOUNTER — Other Ambulatory Visit (HOSPITAL_BASED_OUTPATIENT_CLINIC_OR_DEPARTMENT_OTHER): Payer: Medicare Other

## 2016-07-13 VITALS — BP 99/54 | HR 87 | Temp 97.9°F | Resp 18 | Ht 66.0 in

## 2016-07-13 VITALS — BP 126/83 | HR 79 | Temp 98.5°F | Resp 20

## 2016-07-13 DIAGNOSIS — D509 Iron deficiency anemia, unspecified: Secondary | ICD-10-CM

## 2016-07-13 DIAGNOSIS — Z8546 Personal history of malignant neoplasm of prostate: Secondary | ICD-10-CM | POA: Diagnosis not present

## 2016-07-13 DIAGNOSIS — D320 Benign neoplasm of cerebral meninges: Secondary | ICD-10-CM

## 2016-07-13 DIAGNOSIS — D500 Iron deficiency anemia secondary to blood loss (chronic): Secondary | ICD-10-CM

## 2016-07-13 LAB — IRON AND TIBC
%SAT: 10 % — ABNORMAL LOW (ref 20–55)
Iron: 30 ug/dL — ABNORMAL LOW (ref 42–163)
TIBC: 311 ug/dL (ref 202–409)
UIBC: 282 ug/dL (ref 117–376)

## 2016-07-13 LAB — CBC WITH DIFFERENTIAL/PLATELET
BASO%: 0.5 % (ref 0.0–2.0)
BASOS ABS: 0.1 10*3/uL (ref 0.0–0.1)
EOS ABS: 0 10*3/uL (ref 0.0–0.5)
EOS%: 0.1 % (ref 0.0–7.0)
HEMATOCRIT: 30.7 % — AB (ref 38.4–49.9)
HGB: 9.9 g/dL — ABNORMAL LOW (ref 13.0–17.1)
LYMPH#: 0.7 10*3/uL — AB (ref 0.9–3.3)
LYMPH%: 6.4 % — AB (ref 14.0–49.0)
MCH: 31.7 pg (ref 27.2–33.4)
MCHC: 32.4 g/dL (ref 32.0–36.0)
MCV: 97.9 fL (ref 79.3–98.0)
MONO#: 0.6 10*3/uL (ref 0.1–0.9)
MONO%: 5.7 % (ref 0.0–14.0)
NEUT#: 9.9 10*3/uL — ABNORMAL HIGH (ref 1.5–6.5)
NEUT%: 87.3 % — AB (ref 39.0–75.0)
PLATELETS: 435 10*3/uL — AB (ref 140–400)
RBC: 3.13 10*6/uL — AB (ref 4.20–5.82)
RDW: 16.4 % — ABNORMAL HIGH (ref 11.0–14.6)
WBC: 11.3 10*3/uL — ABNORMAL HIGH (ref 4.0–10.3)

## 2016-07-13 LAB — FERRITIN: Ferritin: 124 ng/ml (ref 22–316)

## 2016-07-13 MED ORDER — SODIUM CHLORIDE 0.9 % IV SOLN
Freq: Once | INTRAVENOUS | Status: AC
  Administered 2016-07-13: 10:00:00 via INTRAVENOUS

## 2016-07-13 MED ORDER — FERUMOXYTOL INJECTION 510 MG/17 ML
510.0000 mg | Freq: Once | INTRAVENOUS | Status: AC
  Administered 2016-07-13: 510 mg via INTRAVENOUS
  Filled 2016-07-13: qty 17

## 2016-07-13 NOTE — Progress Notes (Signed)
Hematology and Oncology Follow Up Visit  [name] [id number] Mar 21, 1931 82 y.o. 07/13/2016 8:26 AM Melinda Crutch, MDRoss, Dwyane Luo, MD   Principle Diagnosis: 82 year old gentleman with iron deficiency anemia diagnosed in February 2016. Presented with hemoglobin of 8.8, RDW of 16.9 and ferritin of 14.8. His GI workup so far has been unrevealing.   Prior Therapy: Status post IV iron for a total of 1 g given in March 2016. This was repeated in January 2017.  Current therapy: Oral iron supplements in the form of iron sulfate for a total of 1300 mg daily.  Interim History: [name] presents today for a follow-up visit. Since the last visit, he was hospitalized again in August 2017 for hematocrit emesis and erosive esophagitis. He was discharged home and has been recovering slowly. He is currently receiving physical therapy and his mobility is improving. He has not reported any GI complaints. He has not reported any hematochezia or melena. Does not report any abdominal pain or distention. His appetite remained excellent at this time. He is reporting fatigue and tiredness at this time.  He does not report any headaches, blurry vision, double vision or syncope or seizures. He does not report any chest pain, palpitation or leg edema or orthopnea. He does report wheezing and exertional dyspnea but no cough or hemoptysis. He does not report any nausea, vomiting, abdominal pain, early satiety. He does not report any frequency, urgency or hesitancy. He does not report any skeletal complaints. Rest of his review of systems unremarkable.   Medications: I have reviewed the patient's current medications.  Current Outpatient Prescriptions  Medication Sig Dispense Refill  . acetaminophen (TYLENOL) 325 MG tablet Take 2 tablets (650 mg total) by mouth every 4 (four) hours as needed. 30 tablet 0  . albuterol (PROVENTIL) (2.5 MG/3ML) 0.083% nebulizer solution Take 3 mLs (2.5 mg total) by nebulization every 4 (four)  hours as needed for wheezing or shortness of breath. 75 mL 12  . B Complex-C (B-COMPLEX WITH VITAMIN C) tablet Take 1 tablet by mouth daily.    Marland Kitchen dexamethasone (DECADRON) 2 MG tablet Take 2 mg by mouth 2 (two) times daily.    . famotidine (PEPCID) 20 MG tablet Take 1 tablet (20 mg total) by mouth 2 (two) times daily.    Marland Kitchen guaiFENesin (ROBITUSSIN) 100 MG/5ML SOLN Take 5 mLs (100 mg total) by mouth every 4 (four) hours as needed for cough or to loosen phlegm. (Patient not taking: Reported on 06/28/2016)  0  . lamoTRIgine (LAMICTAL) 100 MG tablet Take 1 tablet (100 mg total) by mouth 2 (two) times daily. 180 tablet 3  . levETIRAcetam (KEPPRA) 500 MG tablet Take 1 & 1/2 tablets twice a day 270 tablet 3  . Magnesium 500 MG TABS Take by mouth.    . menthol-cetylpyridinium (CEPACOL) 3 MG lozenge Take 1 lozenge (3 mg total) by mouth as needed for sore throat. 100 tablet 12  . multivitamin-lutein (OCUVITE-LUTEIN) CAPS capsule Take 1 capsule by mouth daily.    . ondansetron (ZOFRAN-ODT) 4 MG disintegrating tablet Take 1 tablet (4 mg total) by mouth every 6 (six) hours as needed for nausea. 20 tablet 0  . pantoprazole (PROTONIX) 40 MG tablet Take 1 tablet (40 mg total) by mouth 2 (two) times daily before a meal. 60 tablet 0  . polyethylene glycol (MIRALAX / GLYCOLAX) packet Take 17 g by mouth daily. 14 each 0  . Probiotic Product (PROBIOTIC PO) Take by mouth.    . rivaroxaban (XARELTO)  20 MG TABS tablet Take 1 tablet (20 mg total) by mouth daily with supper.    Marland Kitchen rOPINIRole (REQUIP) 3 MG tablet Take 1.5 mg by mouth 2 (two) times daily.    . solifenacin (VESICARE) 5 MG tablet Take 5 mg by mouth every morning.    . tamsulosin (FLOMAX) 0.4 MG CAPS capsule Take 0.4 mg by mouth daily.  6  . UNABLE TO FIND Med Name: Med pass 120 mL 3 times daily with meals    . valsartan (DIOVAN) 160 MG tablet Take 160 mg by mouth daily.    . vitamin B-12 (CYANOCOBALAMIN) 100 MCG tablet Take 100 mcg by mouth daily.     No  current facility-administered medications for this visit.      Allergies:  Allergies  Allergen Reactions  . Asa [Aspirin] Nausea Only  . Other Itching and Rash    Nuts  . Vicodin [Hydrocodone-Acetaminophen] Nausea Only    Past Medical History, Surgical history, Social history, and Family History were reviewed and updated.   Physical Exam: Blood pressure (!) 99/54, pulse 87, temperature 97.9 F (36.6 C), temperature source Oral, resp. rate 18, height 5\' 6"  (1.676 m), SpO2 100 %. ECOG: 1 General appearance: Well appearing gentleman without distress. Appeared pale. Head: Normocephalic, without obvious abnormality. No oral ulcers or lesions. Neck: no adenopathy Lymph nodes: Cervical, supraclavicular, and axillary nodes normal. Heart:regular rate and rhythm, S1, S2 normal, no murmur, click, rub or gallop Lung:chest clear, no wheezing, rales, normal symmetric air entry Abdomin: soft, non-tender, without masses or organomegaly no shifting dullness or ascites. EXT:no erythema, induration, or nodules   Lab Results: Lab Results  Component Value Date   WBC 11.3 (H) 07/13/2016   HGB 9.9 (L) 07/13/2016   HCT 30.7 (L) 07/13/2016   MCV 97.9 07/13/2016   PLT 435 (H) 07/13/2016     Chemistry      Component Value Date/Time   NA 138 06/02/2016   NA 139 11/25/2014 1414   K 4.0 06/02/2016   K 3.8 11/25/2014 1414   CL 104 05/09/2016 0540   CO2 25 05/09/2016 0540   CO2 23 11/25/2014 1414   BUN 9 06/02/2016   BUN 22.7 05/14/2015 0959   CREATININE 0.9 06/02/2016   CREATININE 0.82 05/09/2016 0540   CREATININE 1.1 05/14/2015 0959   GLU 91 06/02/2016      Component Value Date/Time   CALCIUM 8.2 (L) 05/09/2016 0540   CALCIUM 9.2 11/25/2014 1414   ALKPHOS 86 06/02/2016   ALKPHOS 56 11/25/2014 1414   AST 17 06/02/2016   AST 16 11/25/2014 1414   ALT 11 06/02/2016   ALT 10 11/25/2014 1414   BILITOT 0.4 05/09/2016 0540   BILITOT 0.24 11/25/2014 1414         Impression and  Plan:    82 year old gentleman with the following issues:  1. Iron deficiency anemia presenting with a hemoglobin of 8.8, RDW of 16.9 and ferritin level 14.8 obtained in February 2016. The etiology is unclear and his GI workup is ongoing.   He is status post IV iron in the form of Feraheme for a total of 1 g given on multiple occasions. Last treatment was in July 2017.  He is status post recent hospitalization or hematemesis and his hemoglobin has drifted again today. His hemoglobin is down to 9.9 with an MCV of 97. His RDW is elevated as well as platelet count. He is likely iron deficient again because of his recent blood loss and will benefit  from IV iron. Risks and benefits of infusion of IV iron was discussed and is agreeable to proceed. He'll receive a total of 1 g of Feraheme on 2 separate infusions. I will recheck his iron stores in 3 months.   2. Prostate cancer: Followed by Dr. Junious Silk. No evidence of recurrence.  3. Meningioma: Status post surgical resection followed by radiation therapy. Followed by neurosurgery and radiation oncology.  4. Follow-up: In 3 months for a repeat iron studies.   [facility], MD 10/25/20178:26 AM

## 2016-07-13 NOTE — Patient Instructions (Signed)

## 2016-07-13 NOTE — Telephone Encounter (Signed)
Gave patient/wife avs report and appointments for January. Message to inf mgrs re infusion 11/1. Patient/wife aware they will be contacted re infusion.

## 2016-07-13 NOTE — Telephone Encounter (Signed)
Per infusion supervisor ok to infusion 11/1. Patient to get print out in infusion. I also left message for patient.

## 2016-07-20 ENCOUNTER — Ambulatory Visit (HOSPITAL_BASED_OUTPATIENT_CLINIC_OR_DEPARTMENT_OTHER): Payer: Medicare Other

## 2016-07-20 VITALS — BP 122/74 | HR 78 | Temp 98.2°F | Resp 20

## 2016-07-20 DIAGNOSIS — D509 Iron deficiency anemia, unspecified: Secondary | ICD-10-CM

## 2016-07-20 MED ORDER — SODIUM CHLORIDE 0.9 % IV SOLN
Freq: Once | INTRAVENOUS | Status: AC
  Administered 2016-07-20: 15:00:00 via INTRAVENOUS

## 2016-07-20 MED ORDER — SODIUM CHLORIDE 0.9 % IV SOLN
510.0000 mg | Freq: Once | INTRAVENOUS | Status: AC
  Administered 2016-07-20: 510 mg via INTRAVENOUS
  Filled 2016-07-20: qty 17

## 2016-07-20 NOTE — Patient Instructions (Signed)

## 2016-07-21 ENCOUNTER — Telehealth: Payer: Self-pay | Admitting: Radiation Oncology

## 2016-07-21 NOTE — Telephone Encounter (Signed)
Returned patient's call. Patient confirms he is taking decadron 2 mg bid. Explained that per Shona Simpson, PA-C it is not advised to increase his decadron dose due to increased risk of gastric perforation. Patient verbalized understanding. He explains he has been participating in PT for six weeks but, leg weakness is preventing him from ambulating long distances. Reinforced our concerns. Ultimately, he agreed he didn't need anymore complications and would continue decadron 2 mg bid.

## 2016-07-25 ENCOUNTER — Other Ambulatory Visit: Payer: Self-pay

## 2016-07-25 ENCOUNTER — Emergency Department (HOSPITAL_COMMUNITY): Payer: Medicare Other

## 2016-07-25 ENCOUNTER — Emergency Department (HOSPITAL_COMMUNITY)
Admission: EM | Admit: 2016-07-25 | Discharge: 2016-07-25 | Discharge status: Absent without leave | Payer: Medicare Other | Attending: Emergency Medicine | Admitting: Emergency Medicine

## 2016-07-25 ENCOUNTER — Emergency Department (HOSPITAL_COMMUNITY)
Admission: EM | Admit: 2016-07-25 | Discharge: 2016-07-25 | Disposition: A | Discharge status: Home / Self Care | Payer: Medicare Other | Attending: Emergency Medicine | Admitting: Emergency Medicine

## 2016-07-25 ENCOUNTER — Encounter (HOSPITAL_COMMUNITY): Payer: Self-pay

## 2016-07-25 ENCOUNTER — Emergency Department (HOSPITAL_BASED_OUTPATIENT_CLINIC_OR_DEPARTMENT_OTHER): Admit: 2016-07-25 | Discharge: 2016-07-25 | Disposition: A | Discharge status: Home / Self Care | Payer: Medicare Other

## 2016-07-25 DIAGNOSIS — I129 Hypertensive chronic kidney disease with stage 1 through stage 4 chronic kidney disease, or unspecified chronic kidney disease: Secondary | ICD-10-CM | POA: Diagnosis not present

## 2016-07-25 DIAGNOSIS — N183 Chronic kidney disease, stage 3 (moderate): Secondary | ICD-10-CM | POA: Diagnosis not present

## 2016-07-25 DIAGNOSIS — L03116 Cellulitis of left lower limb: Secondary | ICD-10-CM | POA: Diagnosis not present

## 2016-07-25 DIAGNOSIS — Z8546 Personal history of malignant neoplasm of prostate: Secondary | ICD-10-CM | POA: Insufficient documentation

## 2016-07-25 DIAGNOSIS — M7989 Other specified soft tissue disorders: Secondary | ICD-10-CM | POA: Diagnosis not present

## 2016-07-25 DIAGNOSIS — R600 Localized edema: Secondary | ICD-10-CM | POA: Insufficient documentation

## 2016-07-25 DIAGNOSIS — Z7901 Long term (current) use of anticoagulants: Secondary | ICD-10-CM | POA: Diagnosis not present

## 2016-07-25 DIAGNOSIS — Z79899 Other long term (current) drug therapy: Secondary | ICD-10-CM | POA: Diagnosis not present

## 2016-07-25 DIAGNOSIS — Z96651 Presence of right artificial knee joint: Secondary | ICD-10-CM | POA: Diagnosis not present

## 2016-07-25 DIAGNOSIS — R609 Edema, unspecified: Secondary | ICD-10-CM

## 2016-07-25 LAB — BRAIN NATRIURETIC PEPTIDE: B Natriuretic Peptide: 97.5 pg/mL (ref 0.0–100.0)

## 2016-07-25 LAB — CBC WITH DIFFERENTIAL/PLATELET
Basophils Absolute: 0 10*3/uL (ref 0.0–0.1)
Basophils Relative: 0 %
EOS PCT: 0 %
Eosinophils Absolute: 0 10*3/uL (ref 0.0–0.7)
HCT: 33 % — ABNORMAL LOW (ref 39.0–52.0)
HEMOGLOBIN: 10.6 g/dL — AB (ref 13.0–17.0)
LYMPHS ABS: 0.6 10*3/uL — AB (ref 0.7–4.0)
LYMPHS PCT: 5 %
MCH: 31.7 pg (ref 26.0–34.0)
MCHC: 32.1 g/dL (ref 30.0–36.0)
MCV: 98.8 fL (ref 78.0–100.0)
MONOS PCT: 5 %
Monocytes Absolute: 0.6 10*3/uL (ref 0.1–1.0)
Neutro Abs: 10.3 10*3/uL — ABNORMAL HIGH (ref 1.7–7.7)
Neutrophils Relative %: 90 %
Platelets: 200 10*3/uL (ref 150–400)
RBC: 3.34 MIL/uL — AB (ref 4.22–5.81)
RDW: 15.4 % (ref 11.5–15.5)
WBC: 11.4 10*3/uL — AB (ref 4.0–10.5)

## 2016-07-25 LAB — COMPREHENSIVE METABOLIC PANEL
ALBUMIN: 3.2 g/dL — AB (ref 3.5–5.0)
ALK PHOS: 74 U/L (ref 38–126)
ALT: 24 U/L (ref 17–63)
AST: 24 U/L (ref 15–41)
Anion gap: 10 (ref 5–15)
BUN: 20 mg/dL (ref 6–20)
CALCIUM: 9.1 mg/dL (ref 8.9–10.3)
CHLORIDE: 95 mmol/L — AB (ref 101–111)
CO2: 24 mmol/L (ref 22–32)
CREATININE: 1.15 mg/dL (ref 0.61–1.24)
GFR calc non Af Amer: >60 mL/min (ref >60)
GLUCOSE: 105 mg/dL — AB (ref 65–99)
Potassium: 4.1 mmol/L (ref 3.5–5.1)
SODIUM: 129 mmol/L — AB (ref 135–145)
Total Bilirubin: 0.4 mg/dL (ref 0.3–1.2)
Total Protein: 6.4 g/dL — ABNORMAL LOW (ref 6.5–8.1)

## 2016-07-25 LAB — APTT: aPTT: 49 seconds — ABNORMAL HIGH (ref 24–36)

## 2016-07-25 LAB — PROTIME-INR
INR: 2.11
Prothrombin Time: 24 seconds — ABNORMAL HIGH (ref 11.4–15.2)

## 2016-07-25 MED ORDER — CLINDAMYCIN PHOSPHATE 600 MG/50ML IV SOLN
600.0000 mg | Freq: Once | INTRAVENOUS | Status: AC
  Administered 2016-07-25: 600 mg via INTRAVENOUS
  Filled 2016-07-25: qty 50

## 2016-07-25 MED ORDER — CLINDAMYCIN HCL 150 MG PO CAPS: 450.0000 mg | ORAL_CAPSULE | Freq: Three times a day (TID) | ORAL | 0 refills | Status: AC

## 2016-07-25 NOTE — ED Provider Notes (Signed)
[facility] DEPT Provider Note   CSN: [id number] Arrival date & time: 07/25/16  1337     History   Chief Complaint Chief Complaint  Patient presents with  . Leg Swelling    HPI [name] is a 82 y.o. male with a hx of Chronic low extremity edema  with one week history of cellulitis to his left lower extremity, status post treatment with course of doxycycline, since to the emergency department per request by his home health care nurse and physician for further evaluation of lower extremity edema, worse on the left. The patient denies any fever, chills, but states that he has had improved but persistent redness to his left lower extremity. Denies any history of prior CHF. He does have a history of prior DVT in his left lower extremity, on coumadin. He does have a history of limited mobility, currently in rehab "learning to walk again" after having previous falls and generalized deconditioning.   HPI  Past Medical History:  Diagnosis Date  . Anemia   . Aortic insufficiency 08/11/2015  . Basal cell cancer   . Brain tumor ([place])   . GERD (gastroesophageal reflux disease)   . Herniated disc    lumbar spine  . Hiatal hernia   . History of radiation therapy    back approximately 2010 prostate radiation under care of Dr. Valere Dross   . Hyperlipidemia   . Hypertension   . Meningioma ([place])    brain  . Nocturia   . Occult blood in stools   . OSA (obstructive sleep apnea) 10/28/2014   Moderate with AHI 21/hr  . Prostate cancer ([place])   . Restless leg   . Seizures [facility])     Patient Active Problem List   Diagnosis Date Noted  . Hematemesis 05/07/2016  . Hypotension 05/07/2016  . PUD (peptic ulcer disease) 05/07/2016  . Anticoagulated by anticoagulation treatment   . History of pulmonary embolism   . Lactic acidosis 04/23/2016  . Leukocytosis 04/23/2016  . Nausea & vomiting 04/23/2016  . Nausea 04/23/2016  . Skin irritation 04/23/2016  . History of recent steroid use 04/23/2016   . Pulmonary embolus ([place]) 04/23/2016  . Cough 04/22/2016  . Acute delirium 04/08/2016  . Perforated bowel ([place]) 04/04/2016  . Humerus fracture 12/31/2015  . Closed right humeral fracture 12/31/2015  . Aortic insufficiency 08/11/2015  . 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 Atypical meningioma of brain ([place]) 02/17/2015  . Obesity (BMI 30-39.9) 02/13/2015  . OSA (obstructive sleep apnea) 10/28/2014  . SOB (shortness of breath) 07/31/2014  . Fatigue due to excessive exertion 07/31/2014  . Edema extremities 07/31/2014  . Hypersomnia 07/31/2014  . Meningioma, recurrent of brain ([place]) 09/27/2013  . Unspecified constipation 09/26/2013  . Anemia, iron deficiency 09/26/2013  . Dizziness 09/20/2013  . Fall at home 09/20/2013  . Dehydration 09/20/2013  . Orthostasis 09/20/2013  . CKD (chronic kidney disease), stage III 09/20/2013  . Brain mass 09/20/2013  . Gait instability 08/20/2013  . Hypokalemia 10/09/2012  . Ileus ([place]) 10/08/2012  . Acute kidney injury ([place]) 10/08/2012  . Hypertension 10/08/2012  . Hyperlipidemia 10/08/2012  . GERD (gastroesophageal reflux disease) 10/08/2012  . Restless leg syndrome 10/08/2012    Past Surgical History:  Procedure Laterality Date  . APPLICATION OF CRANIAL NAVIGATION N/A 02/17/2015   Procedure: APPLICATION OF CRANIAL NAVIGATION;  Surgeon: Consuella Lose, MD;  Location: [facility] NEURO ORS;  Service: Neurosurgery;  Laterality: N/A;  APPLICATION OF CRANIAL NAVIGATION  . Bilateral cateract surgery    . COLONOSCOPY     colon polyp removed  . CRANIOTOMY Right 09/27/2013   Procedure: RIGHT FRONTAL CRANIOTOMY FOR TUMOR RESECTION ;  Surgeon: Consuella Lose, MD;  Location: [place] NEURO ORS;  Service: Neurosurgery;  Laterality: Right;  . CRANIOTOMY Right 02/17/2015   Procedure: Right frontal parietal craniotomy for resection of  meningioma with brainlab;  Surgeon: Consuella Lose, MD;  Location: [place] NEURO ORS;  Service: Neurosurgery;  Laterality: Right;  Right frontal parietal craniotomy for resection of meningioma with brainlab  . ESOPHAGOGASTRODUODENOSCOPY N/A 05/07/2016   Procedure: ESOPHAGOGASTRODUODENOSCOPY (EGD);  Surgeon: Clarene Essex, MD;  Location: [facility];  Service: Endoscopy;  Laterality: N/A;  . JOINT REPLACEMENT     right knee replacement  . LAPAROTOMY N/A 04/04/2016   Procedure: EXPLORATORY LAPAROTOMY, REPAIR OF GASTRIC PERFORATION WITH OMENTAL PATCH, PLACEMENT OF GASTROSTOMY TUBE;  Surgeon: Autumn Messing III, MD;  Location: [facility];  Service: General;  Laterality: N/A;  . PROSTATE BIOPSY    . TONSILLECTOMY     Agae 6-7  . VASECTOMY         Home Medications    Prior to Admission medications   Medication Sig Start Date End Date Taking? Authorizing Provider  acetaminophen (TYLENOL) 325 MG tablet Take 2 tablets (650 mg total) by mouth every 4 (four) hours as needed. 04/15/16 04/15/17 Yes Earnstine Regal, PA-C  B Complex-C (B-COMPLEX WITH VITAMIN C) tablet Take 1 tablet by mouth daily.   Yes Historical Provider, MD  dexamethasone (DECADRON) 2 MG tablet Take 2 mg by mouth 2 (two) times daily.   Yes Historical Provider, MD  famotidine (PEPCID) 20 MG tablet Take 1 tablet (20 mg total) by mouth 2 (two) times daily. 04/15/16  Yes Earnstine Regal, PA-C  furosemide (LASIX) 20 MG tablet Take 20 mg by mouth daily.   Yes Historical Provider, MD  lamoTRIgine (LAMICTAL) 100 MG tablet Take 1 tablet (100 mg total) by mouth 2 (two) times daily. 06/28/16  Yes Cameron Sprang, MD  levETIRAcetam (KEPPRA) 500 MG tablet Take 1 & 1/2 tablets twice a day Patient taking differently: Take 750 mg by mouth 2 (two) times daily. Take 1 & 1/2 tablets twice a day 06/28/16  Yes Cameron Sprang, MD  Magnesium 500 MG TABS Take 500 mg by mouth daily.    Yes Historical Provider, MD  multivitamin-lutein (OCUVITE-LUTEIN) CAPS capsule Take 1  capsule by mouth daily.   Yes Historical Provider, MD  pantoprazole (PROTONIX) 40 MG tablet Take 1 tablet (40 mg total) by mouth 2 (two) times daily before a meal. 05/10/16  Yes Lavina Hamman, MD  Probiotic Product (PROBIOTIC PO) Take 1 capsule by mouth daily.    Yes Historical Provider, MD  rOPINIRole (REQUIP) 3 MG tablet Take 1.5 mg by mouth 2 (two) times daily.   Yes Historical Provider, MD  solifenacin (VESICARE) 5 MG tablet Take 5 mg by mouth every morning.   Yes Historical Provider, MD  tamsulosin (FLOMAX) 0.4 MG CAPS capsule Take 0.4 mg by mouth daily after breakfast.   Yes Historical Provider, MD  valsartan (DIOVAN) 160 MG tablet Take 160 mg by mouth daily.   Yes Historical Provider, MD  vitamin B-12 (CYANOCOBALAMIN) 100 MCG tablet Take 100 mcg by mouth daily.   Yes Historical Provider, MD  warfarin (COUMADIN) 5 MG tablet Take 5 mg by mouth daily at 6 PM. Takes 5mg  on mon, wed and fri  Takes 2.5mg   all other days   Yes Historical Provider, MD  albuterol (PROVENTIL) (2.5 MG/3ML) 0.083% nebulizer solution Take 3 mLs (2.5 mg total) by nebulization every 4 (four) hours as needed for wheezing or shortness of breath. Patient not taking: Reported on 07/25/2016 04/15/16   Earnstine Regal, PA-C  clindamycin (CLEOCIN) 150 MG capsule Take 3 capsules (450 mg total) by mouth 3 (three) times daily. 07/25/16 08/01/16  Zenovia Jarred, DO  guaiFENesin (ROBITUSSIN) 100 MG/5ML SOLN Take 5 mLs (100 mg total) by mouth every 4 (four) hours as needed for cough or to loosen phlegm. Patient not taking: Reported on 07/25/2016 04/30/16   Domenic Polite, MD  menthol-cetylpyridinium (CEPACOL) 3 MG lozenge Take 1 lozenge (3 mg total) by mouth as needed for sore throat. Patient not taking: Reported on 07/25/2016 05/10/16   Lavina Hamman, MD  ondansetron (ZOFRAN-ODT) 4 MG disintegrating tablet Take 1 tablet (4 mg total) by mouth every 6 (six) hours as needed for nausea. Patient not taking: Reported on 07/25/2016 04/15/16    Earnstine Regal, PA-C  polyethylene glycol [facility] Floria Raveling) packet Take 17 g by mouth daily. Patient not taking: Reported on 07/25/2016 04/30/16   Domenic Polite, MD  rivaroxaban (XARELTO) 20 MG TABS tablet Take 1 tablet (20 mg total) by mouth daily with supper. Patient not taking: Reported on 07/25/2016 05/19/16   Domenic Polite, MD    Family History Family History  Problem Relation Age of Onset  . Parkinsonism Mother   . Dementia Father     Social History Social History  Substance Use Topics  . Smoking status: Never Smoker  . Smokeless tobacco: Former Systems developer    Types: Chew    Quit date: 10/08/1973  . Alcohol use No     Allergies   Asa [aspirin]; Other; and Vicodin [hydrocodone-acetaminophen]   Review of Systems Review of Systems  Constitutional: Negative for activity change, chills and fever.  Respiratory: Negative for chest tightness, shortness of breath and wheezing.   Cardiovascular: Positive for leg swelling. Negative for chest pain.  Gastrointestinal: Negative for abdominal pain, nausea and vomiting.  Genitourinary: Negative for dysuria, frequency and urgency.  Musculoskeletal: Positive for gait problem. Negative for back pain and neck pain.  Skin: Positive for rash. Negative for wound.  Neurological: Negative for dizziness, syncope, weakness, light-headedness and headaches.  All other systems reviewed and are negative.    Physical Exam Updated Vital Signs BP 131/69   Pulse 79   Temp 98.3 F (36.8 C)   Resp 19   SpO2 96%   Physical Exam  Constitutional: He is oriented to person, place, and time. He appears well-developed and well-nourished. No distress.  HENT:  Head: Normocephalic and atraumatic.  Nose: Nose normal.  Mouth/Throat: Oropharynx is clear and moist.  Eyes: Conjunctivae and EOM are normal. Pupils are equal, round, and reactive to light.  Neck: Neck supple.  Cardiovascular: Normal rate, regular rhythm, normal heart sounds and intact distal  pulses.   Pulmonary/Chest: Effort normal and breath sounds normal.  Abdominal: Soft. He exhibits no distension. There is no tenderness.  Musculoskeletal: He exhibits edema (3+ in LLE, 2+ in RLE) and tenderness.  Neurological: He is alert and oriented to person, place, and time. No cranial nerve deficit. Coordination normal.  Skin: Skin is warm and dry. Rash noted. He is not diaphoretic. There is erythema.  Nursing note and vitals reviewed.    ED Treatments / Results  Labs (all labs ordered are listed, but only abnormal results are displayed) Labs  Reviewed - No data to display  EKG  EKG Interpretation None       Radiology Dg Chest 2 View  Result Date: 07/25/2016 CLINICAL DATA:  Edema EXAM: CHEST  2 VIEW COMPARISON:  05/08/2016 FINDINGS: Right CP angle is not included. Left diaphragm remains elevated. Linear atelectasis at the left base. No consolidation. Stable cardiomediastinal silhouette. Hiatal hernia. No pneumothorax. Multiple compression deformities of the lower spine. Atherosclerosis of the aorta. IMPRESSION: 1. Low lung volumes with elevated left diaphragm and left basilar atelectasis. 2. Mild cardiomegaly without overt failure. Electronically Signed   By: Donavan Foil M.D.   On: 07/25/2016 17:32    Procedures Procedures (including critical care time)  Medications Ordered in ED Medications  clindamycin (CLEOCIN) IVPB 600 mg (0 mg Intravenous Stopped 07/25/16 2049)     Initial Impression / Assessment and Plan / ED Course  I have reviewed the triage vital signs and the nursing notes.  Pertinent labs & imaging results that were available during my care of the patient were reviewed by me and considered in my medical decision making (see chart for details).  Clinical Course    82 y.o. male presents with persistent redness and swelling over his LLE. Concern raised for treatment refractory cellulitis vs DVT despite therapeutic coumadin vs CHF.  Labs were drawn and  returned with normal BNP. WBC 11.4, Na 129.   LE [place] was done and was negative for acute DVT.  Feel that his swelling is due to persistent swelling in the setting of chronic LE edema underlying.   He was given a dose of clinda IV in the ED given persistent redness and swelling and was rx'd the same for further treatment of persistent cellulitis. He was recommended to follow up with his PCP in a few days to monitor for improvement in his sx. This plan was discussed with the patient at the bedside and he stated both understanding and agreement.   Final Clinical Impressions(s) / ED Diagnoses   Final diagnoses:  Cellulitis of left lower extremity  Peripheral edema    New Prescriptions Discharge Medication List as of 07/25/2016  8:30 PM    START taking these medications   Details  clindamycin (CLEOCIN) 150 MG capsule Take 3 capsules (450 mg total) by mouth 3 (three) times daily., Starting Mon 07/25/2016, Until Mon 08/01/2016, [place], DO 07/27/16 [id number]    Drenda Freeze, MD 07/28/16 1229

## 2016-07-25 NOTE — ED Notes (Addendum)
Wrong pt

## 2016-07-25 NOTE — ED Triage Notes (Signed)
Pt presents for evaluation of edema. Pt. States home health RN called his physician today regarding pitting edema to all four extremities. Pt. Denies SOB/CP. Pt. AxO x4. Pt. Has multiple bandages to extremities with skin breakdown being cared for by home health.

## 2016-07-25 NOTE — Progress Notes (Signed)
VASCULAR LAB PRELIMINARY  PRELIMINARY  PRELIMINARY  PRELIMINARY  Left lower extremity venous duplex completed.    Preliminary report:  Left:  No evidence of DVT, superficial thrombosis, or Baker's cyst.  [name], RVS 07/25/2016, 7:16 PM

## 2016-07-25 NOTE — ED Triage Notes (Addendum)
PT has pedal edema worse in left leg. He is on lasix. Weak and cannot ambulate (in therapy to get stronger). Pt denies pain except for ulcer on buttocks. Denies having heart failure; no hx in computer.

## 2016-07-25 NOTE — ED Notes (Signed)
Per vascular staff [place] negative, pt to come back to his room.

## 2016-08-30 ENCOUNTER — Other Ambulatory Visit: Payer: Self-pay | Admitting: Internal Medicine

## 2016-09-05 ENCOUNTER — Other Ambulatory Visit: Payer: Self-pay | Admitting: Internal Medicine

## 2016-09-05 DIAGNOSIS — G40109 Localization-related (focal) (partial) symptomatic epilepsy and epileptic syndromes with simple partial seizures, not intractable, without status epilepticus: Secondary | ICD-10-CM

## 2016-09-20 ENCOUNTER — Other Ambulatory Visit: Payer: Self-pay | Admitting: Internal Medicine

## 2016-09-23 ENCOUNTER — Telehealth: Payer: Self-pay | Admitting: Radiation Oncology

## 2016-09-23 NOTE — Telephone Encounter (Signed)
Phoned patient's home to explain I had reach out to [employer] but, hadn't received a return call yet. No answer and no option to leave a message.

## 2016-09-23 NOTE — Telephone Encounter (Signed)
Phoned patient at home. Spoke with he and his wife via speaker phone. Patient confirms continued inability to walk, diplopia and back pain.  Explained that per Shona Simpson, PA-C an evaluation and possibly a scan would need to be obtained before decadron could be refilled. Stressed that prolonged use of steroids puts him at greater risk of another bowel perforation. Patient and wife verbalized they have no way to get him to our office for an evaluation due to his inability to ambulate. Offered a palliative care referral and they accepted stating, "they will take all the help they can get." Learned that [facility] visits the home three times per week. Committed to contacting Doralee Albino, case manager at [facility], to devise a plan to address his needs. Patient understands this RN will phone him back with further details once a plan is in place.

## 2016-09-26 ENCOUNTER — Other Ambulatory Visit: Payer: Self-pay | Admitting: Internal Medicine

## 2016-09-26 ENCOUNTER — Telehealth: Payer: Self-pay | Admitting: Radiation Oncology

## 2016-09-26 DIAGNOSIS — G40109 Localization-related (focal) (partial) symptomatic epilepsy and epileptic syndromes with simple partial seizures, not intractable, without status epilepticus: Secondary | ICD-10-CM

## 2016-09-26 NOTE — Telephone Encounter (Signed)
Did not receive call back from Doralee Albino, case manager at [facility]. Phoned back and spoke with Jenny Reichmann, Midwife. Jenny Reichmann confirms a palliative care referral will not stop them from being able to visit the patient three times per week. Completed palliative care referral paperwork. Patient paperwork and pertinent medical records to [facility] fax no. [phone]. Confirmation fax of delivery obtained. Phoned patient's home, spoke with his wife, and explained what has transpired. She verbalized understanding and expressed appreciation for the call back.

## 2016-10-10 ENCOUNTER — Encounter: Payer: Self-pay | Admitting: Radiation Therapy

## 2016-10-10 ENCOUNTER — Telehealth: Payer: Self-pay | Admitting: Oncology

## 2016-10-10 NOTE — Telephone Encounter (Signed)
Patient's wife called this morning to let us know that her husband had passed away

## 2016-10-10 NOTE — Progress Notes (Signed)
   This is a clip from his obituary.  Mont Dutton

## 2016-10-12 ENCOUNTER — Other Ambulatory Visit: Payer: Medicare Other

## 2016-10-12 ENCOUNTER — Ambulatory Visit: Payer: Medicare Other | Admitting: Oncology

## 2016-10-20 DEATH — deceased

## 2016-12-30 ENCOUNTER — Other Ambulatory Visit: Payer: Self-pay | Admitting: Nurse Practitioner

## 2017-01-11 ENCOUNTER — Ambulatory Visit: Payer: Medicare Other | Admitting: Neurology

## 2017-05-05 IMAGING — CR DG SHOULDER 2+V*R*
2 series · 2 of 2 positions shown · non-contrast
Comparison: None.

CLINICAL DATA: Tripped and fell landing on right shoulder with
resulting pain.

EXAM:
RIGHT SHOULDER - 2+ VIEW

[x shoulder ap right (1 of 2)]
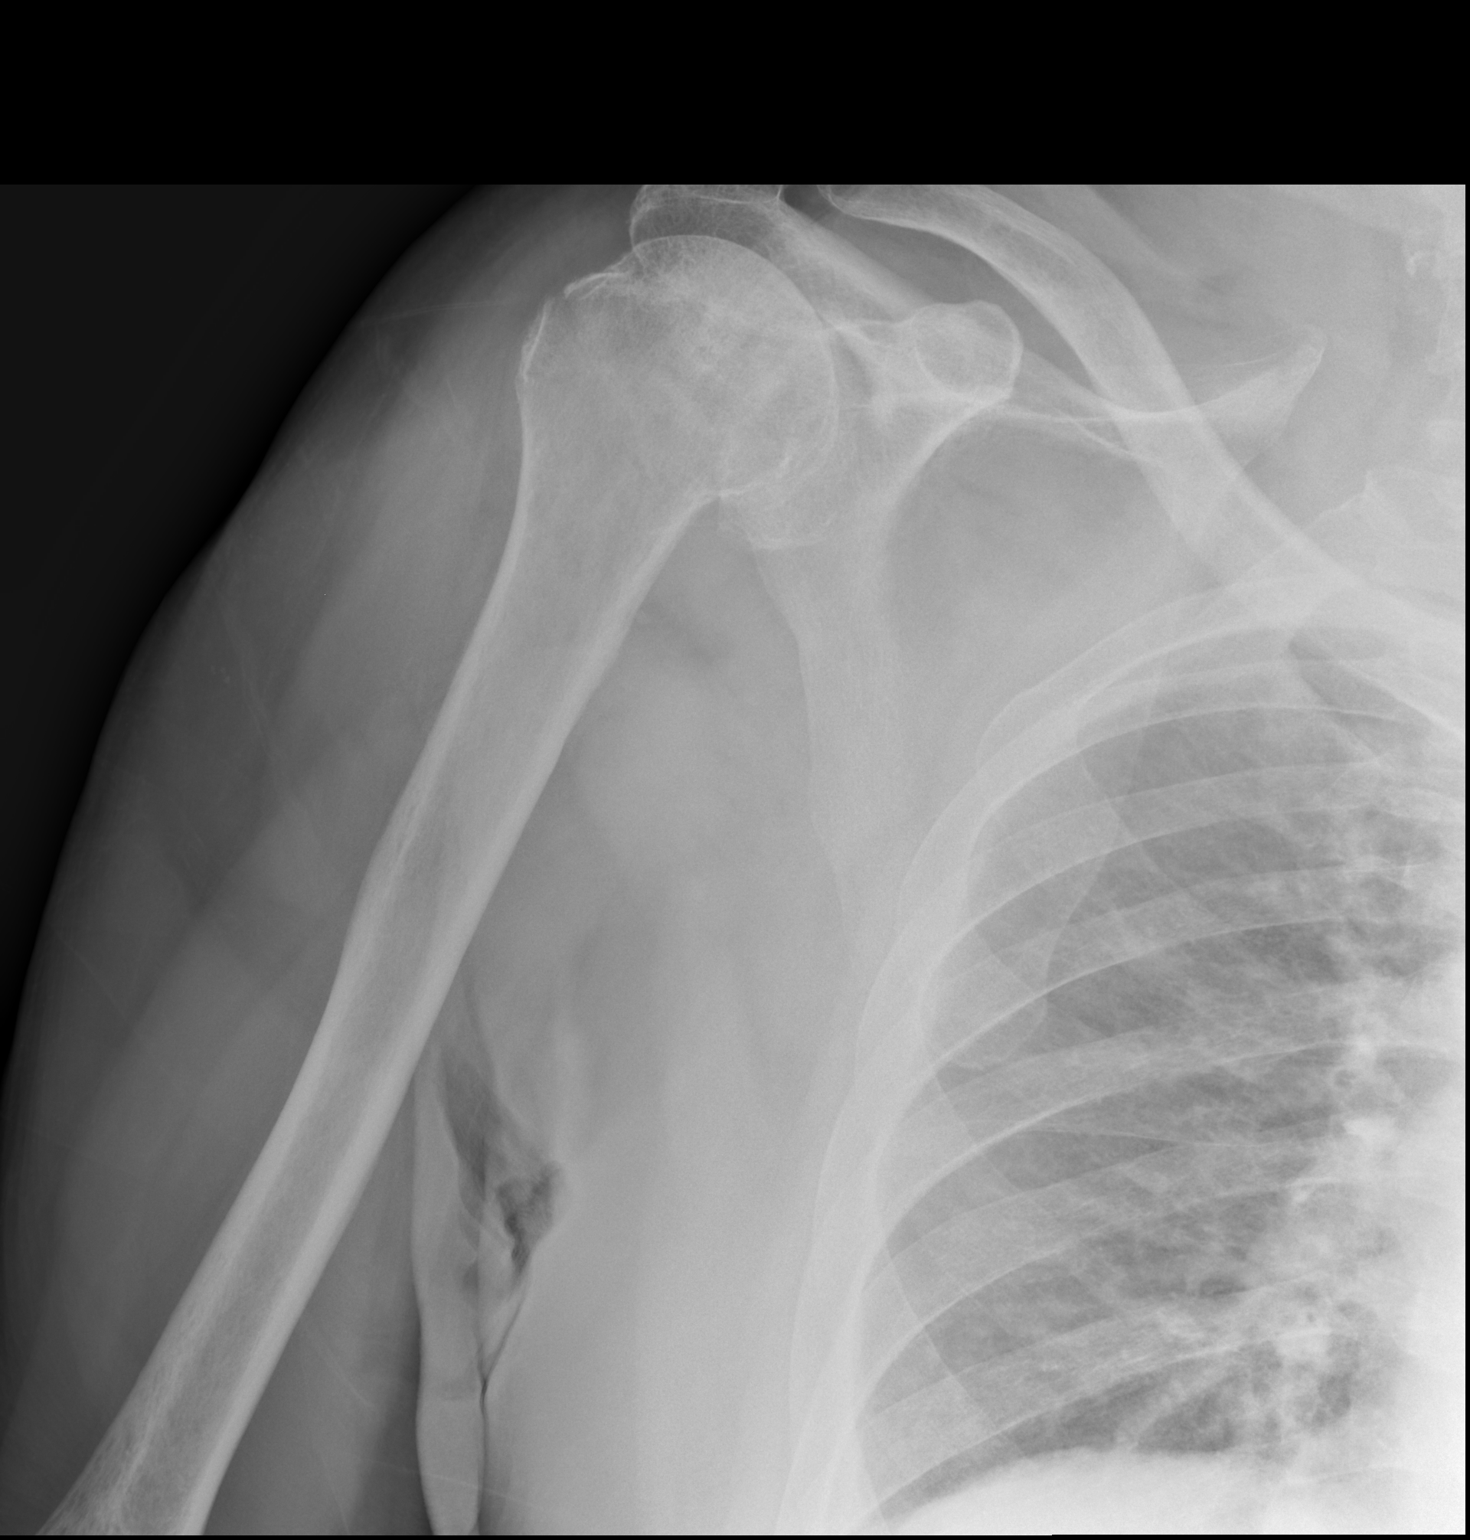

[x shoulder ap right (2 of 2)]
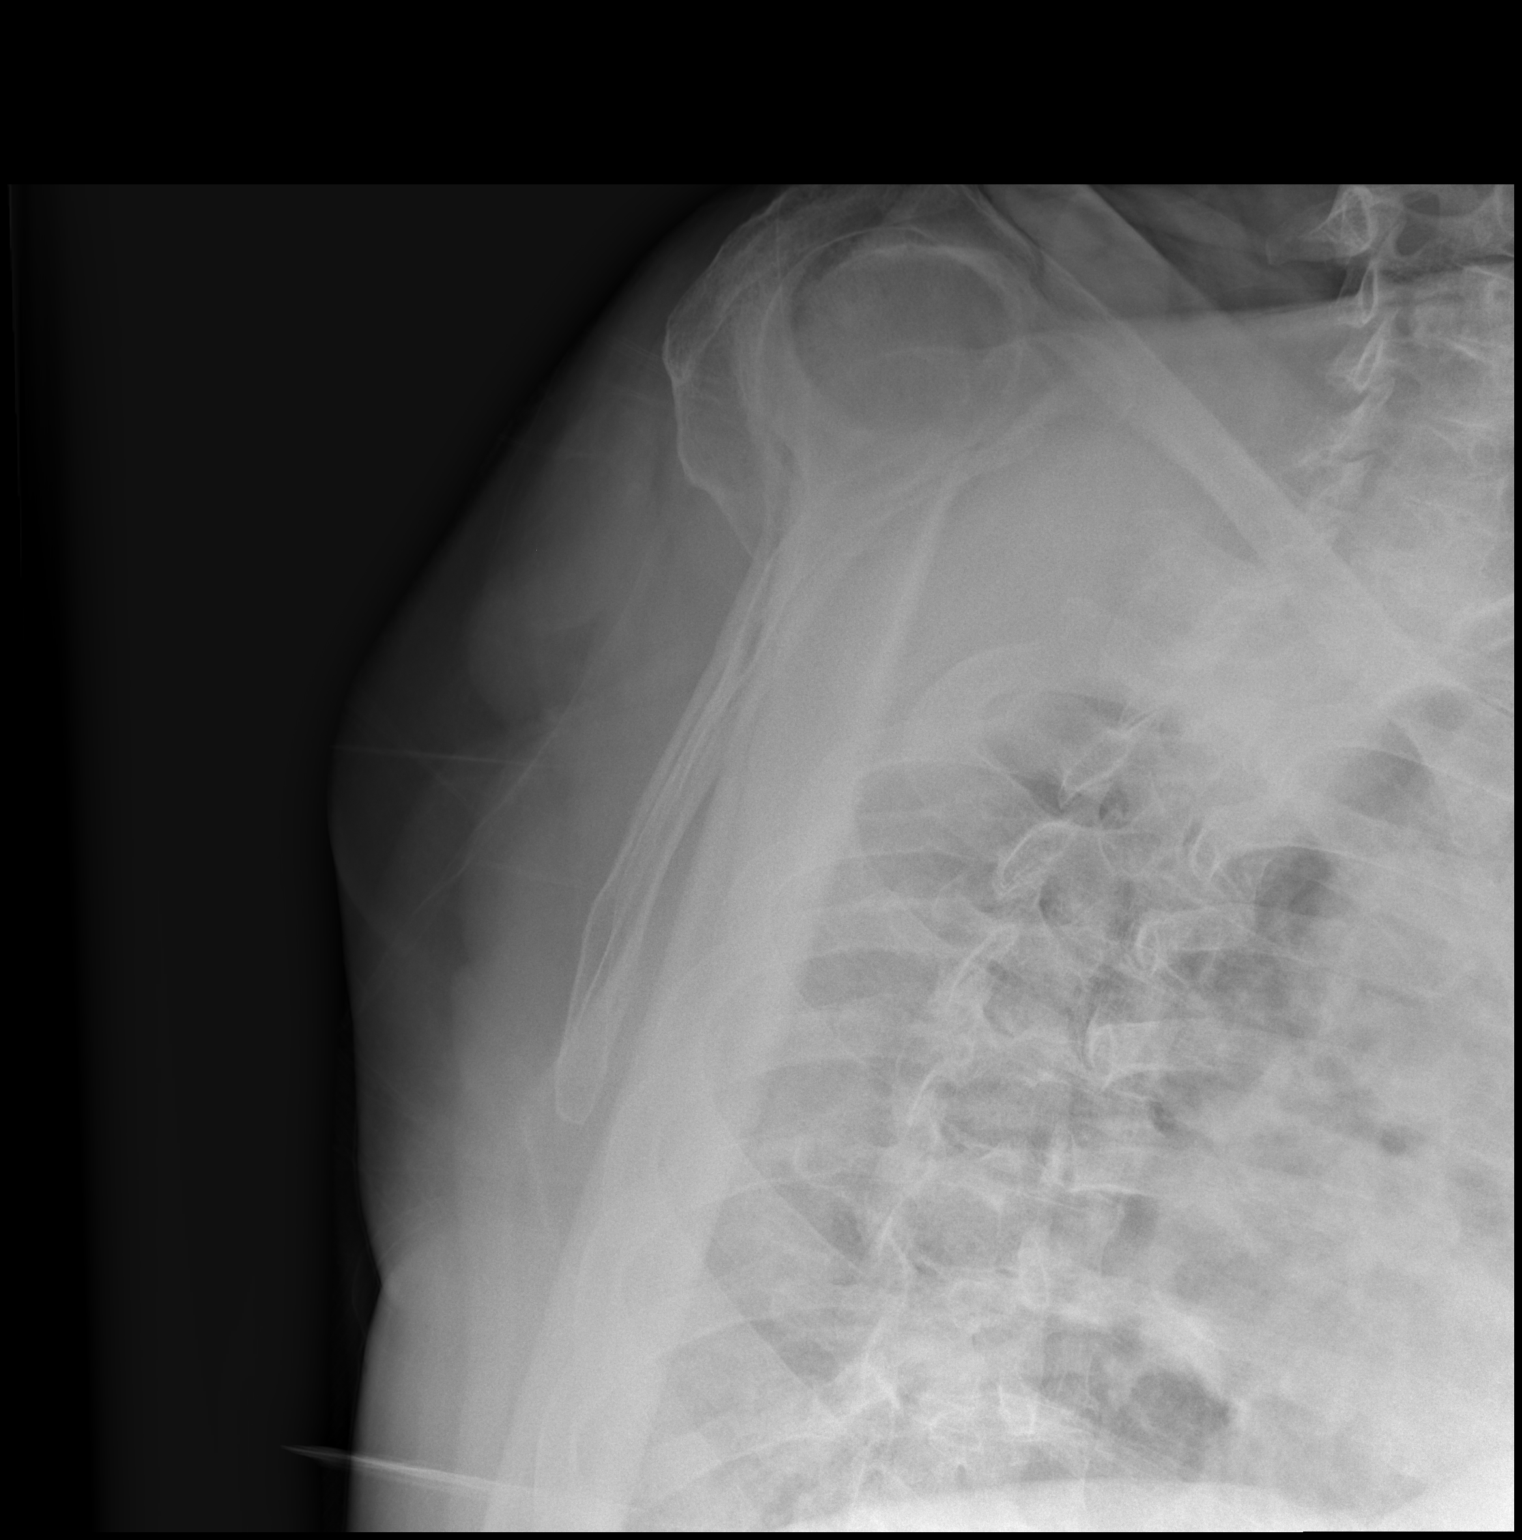

[2 of 2 positions shown; findings below may reference images not displayed]

FINDINGS: There are degenerative changes of the glenohumeral joint and
normally over the AC joint. Possible cortical disruption over the
greater tuberosity extending towards the humeral neck as cannot
exclude a fracture in this location. No evidence of shoulder
dislocation.
IMPRESSION: Possible fracture involving the greater tuberosity extending towards
the humeral neck. Consider internal and external rotation views.

## 2017-05-05 IMAGING — CT CT HEAD W/O CM
2 series · 17 of 30 positions shown, 20 images · non-contrast
Comparison: MRI 11/09/2015

CLINICAL DATA: Meningioma.  Fell today.

EXAM:
CT HEAD WITHOUT CONTRAST
TECHNIQUE: Contiguous axial images were obtained from the base of the skull
through the vertex without intravenous contrast.

[Series 2: head w/o · axial · non-contrast · 0.45mm/px · z∈[-194,-69]mm · 9 of 33 slices shown, 12 images]
[im 4/33  brain]
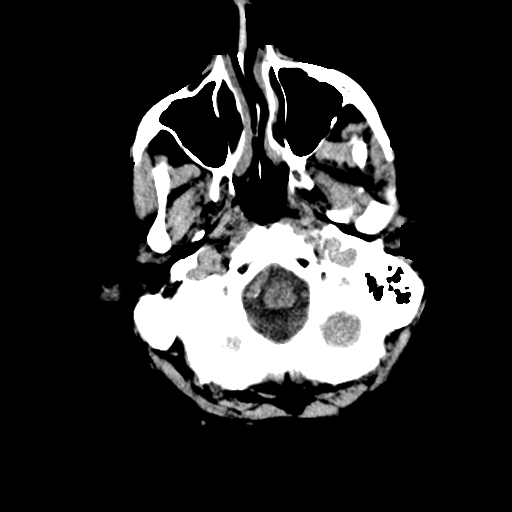
[im 4/33  bone]
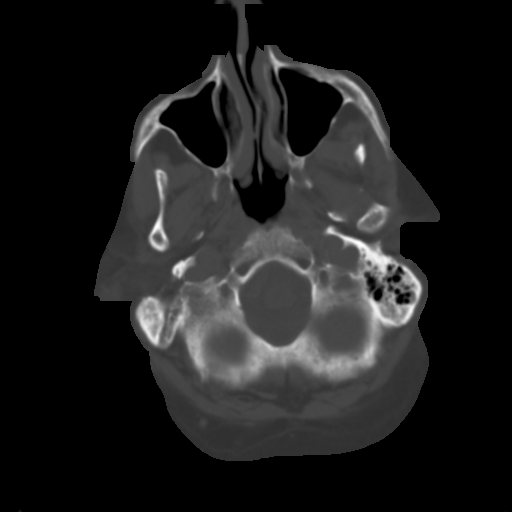
[im 7/33  brain]
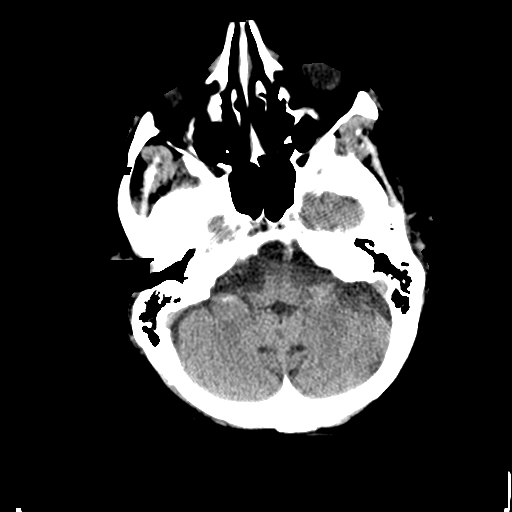
[im 10/33  brain]
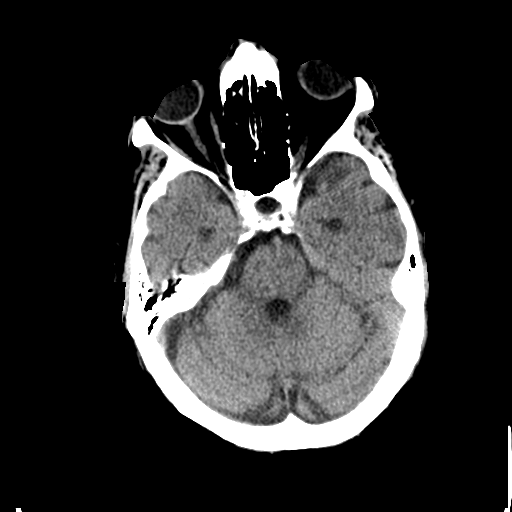
[im 13/33  brain]
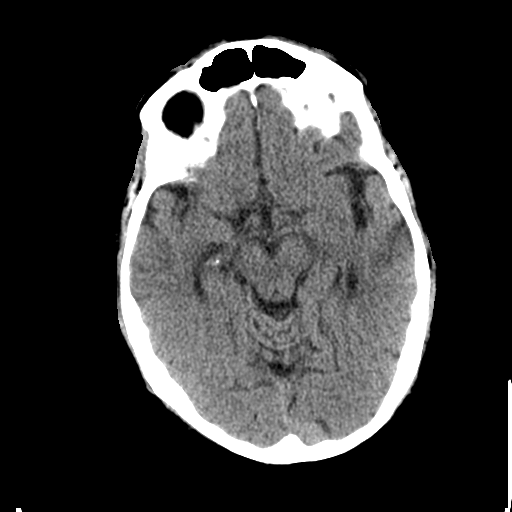
[im 17/33  brain]
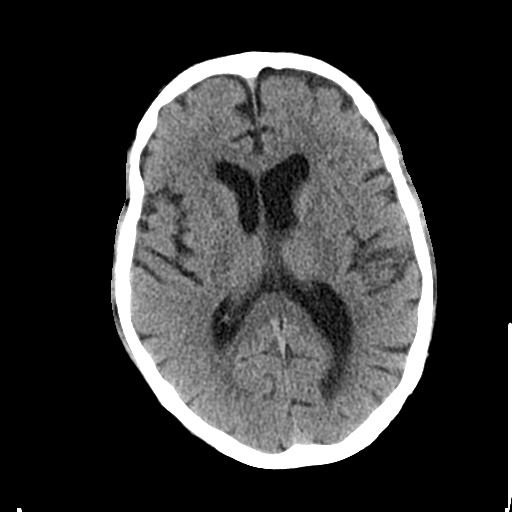
[im 17/33  bone]
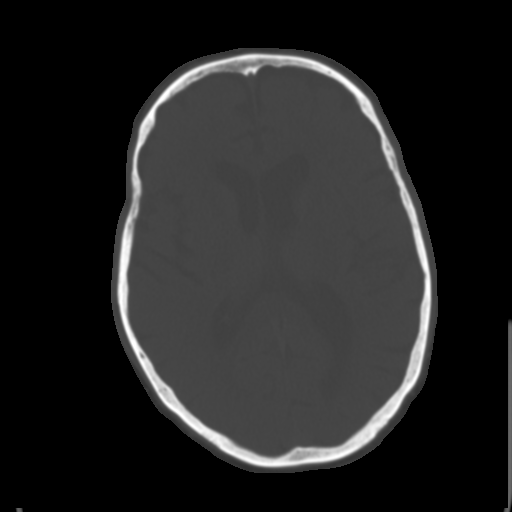
[im 20/33  brain]
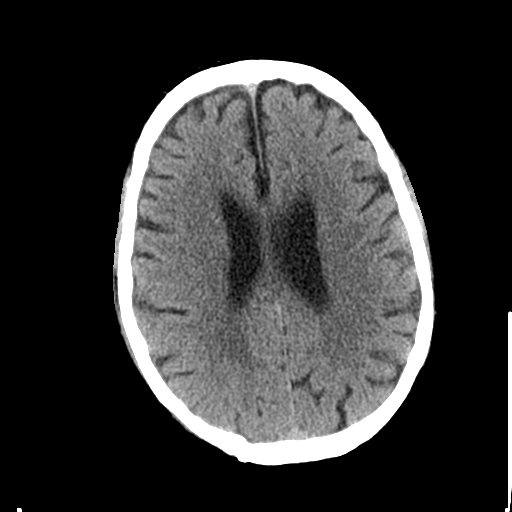
[im 23/33  brain]
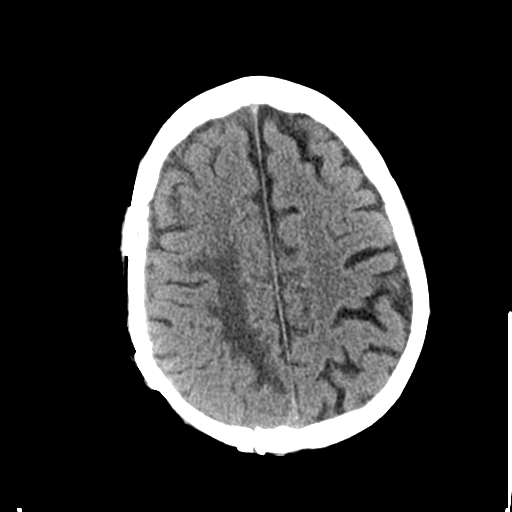
[im 26/33  brain]
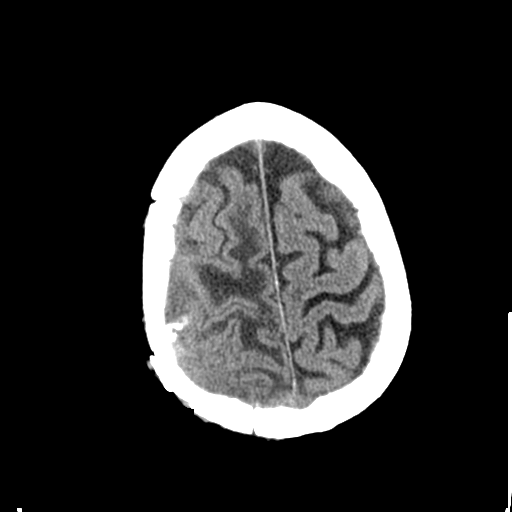
[im 29/33  brain]
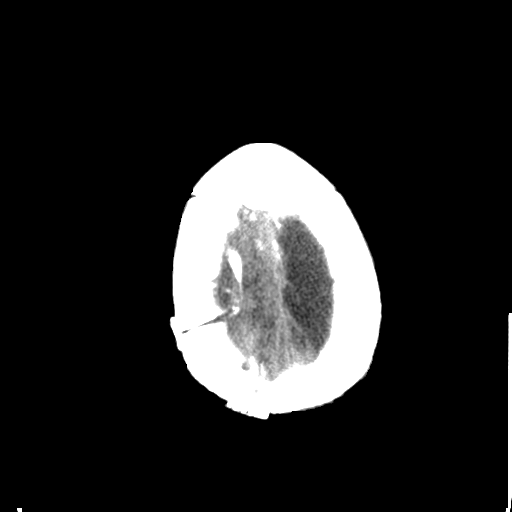
[im 29/33  bone]
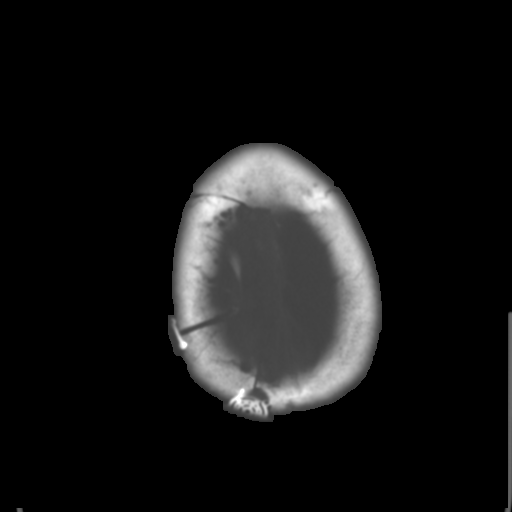

[Series 3: bone windows · axial · 0.45mm/px · z∈[-191,-65]mm · 8 of 55 slices shown]
[im 7/55  bone]
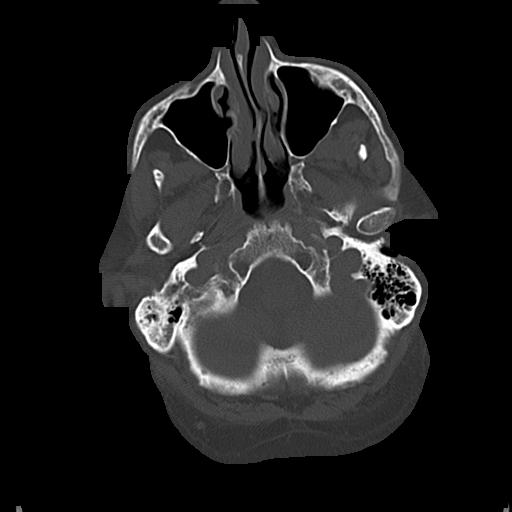
[im 13/55  bone]
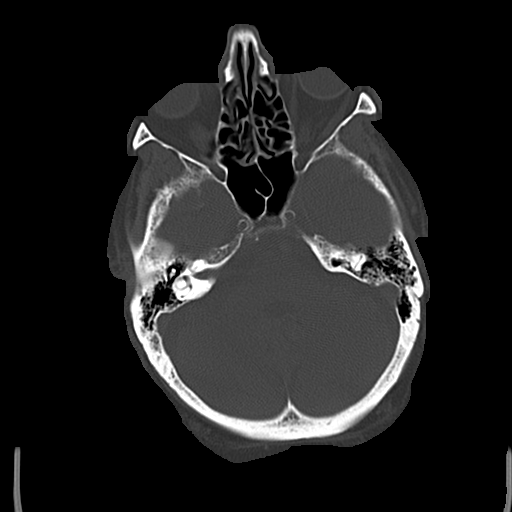
[im 19/55  bone]
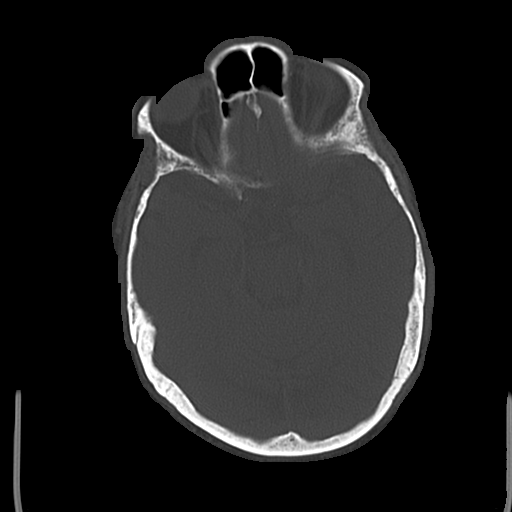
[im 25/55  bone]
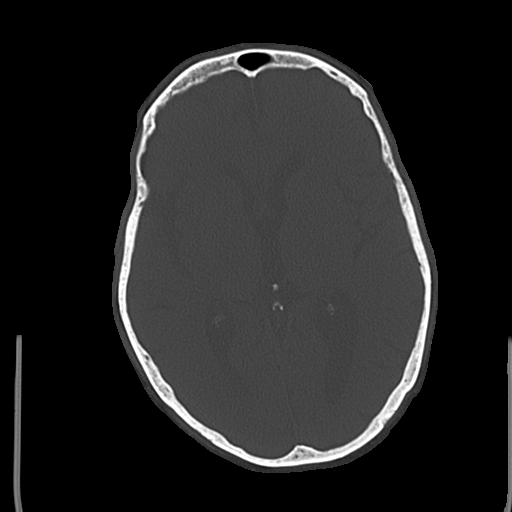
[im 31/55  bone]
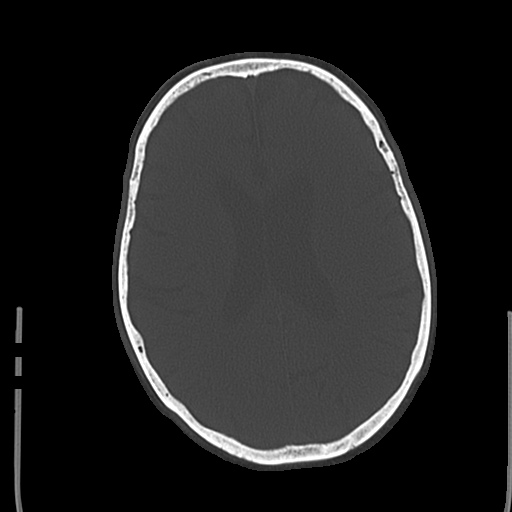
[im 37/55  bone]
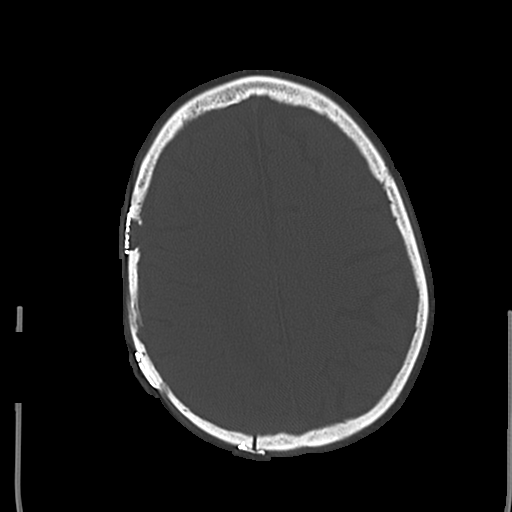
[im 43/55  bone]
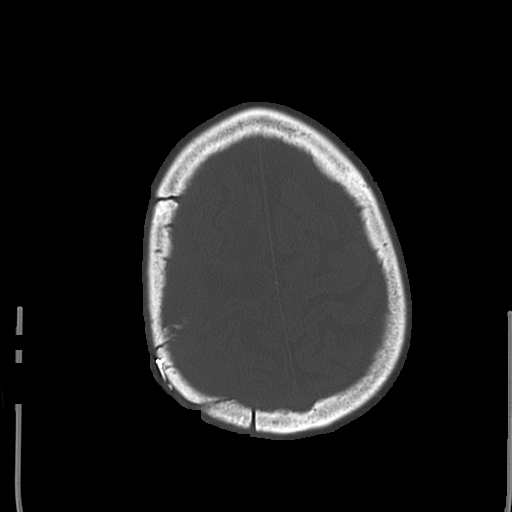
[im 49/55  bone]
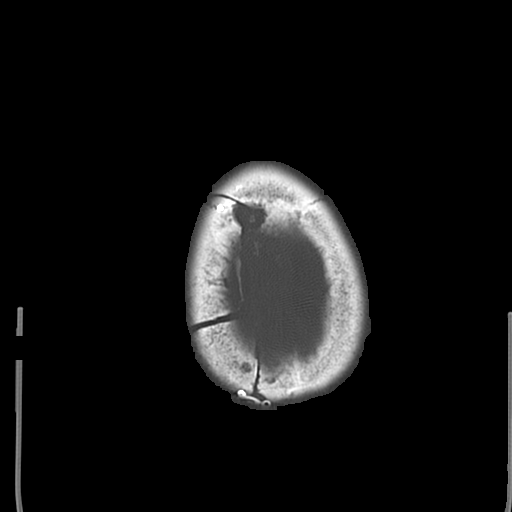

[17 of 30 positions shown; findings below may reference images not displayed]

FINDINGS: Postop craniotomy for meningioma resection over the right convexity.
Recurrent tumor best seen on the prior MRI with contrast.

Right parietal white matter edema has progressed since the prior
MRI. This could be due to actively growing tumor.

Generalized atrophy. Negative for acute infarct. Negative for
intracranial hemorrhage.

Negative for skull fracture.
IMPRESSION: No intracranial hemorrhage.

Right parietal convexity meningioma best seen on prior MRI. Increase
in white matter edema in the right parietal lobe suggesting active
tumor growth. Tumor best evaluated on MRI with contrast.

## 2017-08-10 IMAGING — DX DG CHEST 1V PORT
1 series · 1 of 1 positions shown · non-contrast
Comparison: 04/05/2016.

CLINICAL DATA: Intubation.

EXAM:
PORTABLE CHEST 1 VIEW

[chest ap]
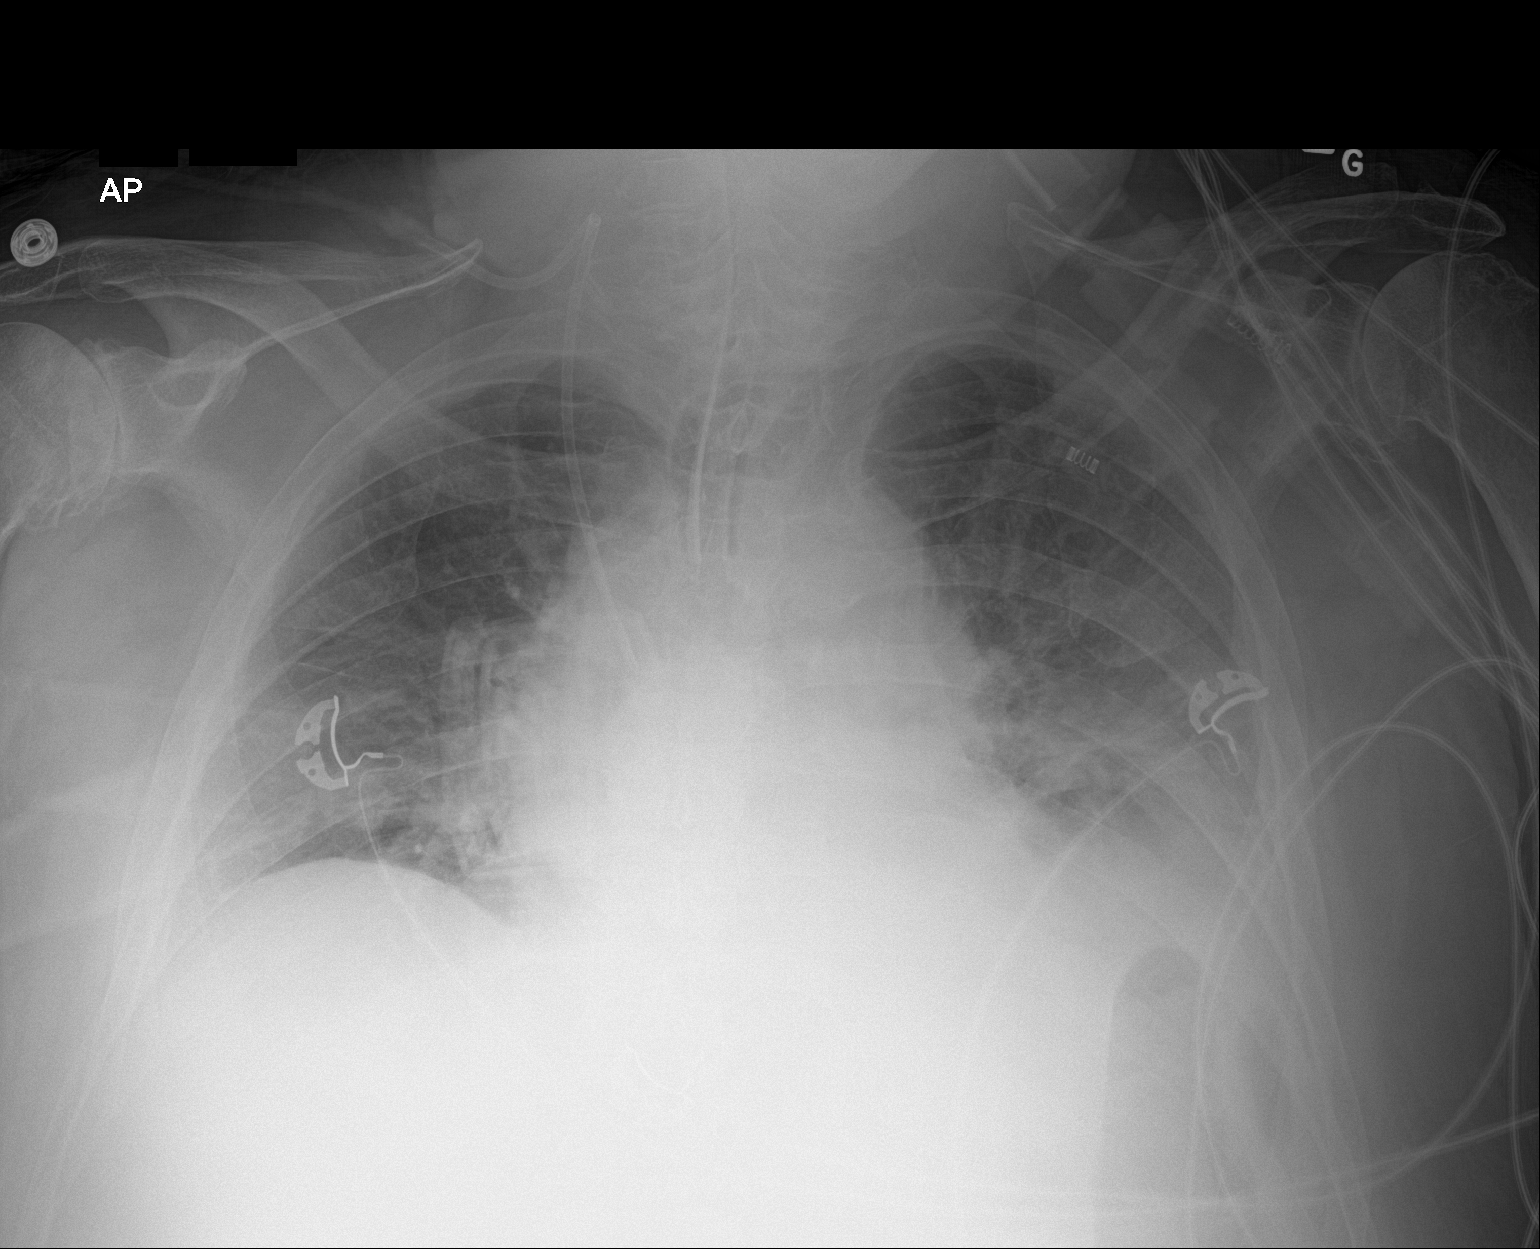

[1 of 1 positions shown; findings below may reference images not displayed]

FINDINGS: Endotracheal tube 1.8 cm above the carina. Proximal repositioning of
approximately 1 -2 cm should be considered. Right IJ line stable
position. Heart size stable. Low lung volumes with persist bibasilar
atelectasis and/or infiltrates. Small bilateral pleural effusions
cannot be excluded. No pneumothorax.
IMPRESSION: 1. Endotracheal tube 1.8 cm above the carina. Proximal repositioning
of approximately 1-2 cm should be considered . Right IJ line in
stable position.

2. Low lung volumes with persistent bibasilar atelectasis and/or
infiltrates. Small bilateral pleural effusions cannot be excluded .
